# Patient Record
Sex: Female | Born: 1970
Health system: Southern US, Community
[De-identification: ages and names within clinical notes are randomized; demographics above are authoritative.]

## PROBLEM LIST (undated history)

## (undated) DIAGNOSIS — Z9849 Cataract extraction status, unspecified eye: Secondary | ICD-10-CM

## (undated) DIAGNOSIS — N644 Mastodynia: Secondary | ICD-10-CM

## (undated) DIAGNOSIS — B351 Tinea unguium: Secondary | ICD-10-CM

## (undated) DIAGNOSIS — E1140 Type 2 diabetes mellitus with diabetic neuropathy, unspecified: Secondary | ICD-10-CM

## (undated) DIAGNOSIS — Z789 Other specified health status: Secondary | ICD-10-CM

## (undated) DIAGNOSIS — D649 Anemia, unspecified: Secondary | ICD-10-CM

## (undated) DIAGNOSIS — E249 Cushing's syndrome, unspecified: Secondary | ICD-10-CM

## (undated) DIAGNOSIS — J449 Chronic obstructive pulmonary disease, unspecified: Secondary | ICD-10-CM

## (undated) DIAGNOSIS — R000 Tachycardia, unspecified: Secondary | ICD-10-CM

## (undated) DIAGNOSIS — IMO0002 Reserved for concepts with insufficient information to code with codable children: Secondary | ICD-10-CM

## (undated) DIAGNOSIS — E119 Type 2 diabetes mellitus without complications: Secondary | ICD-10-CM

## (undated) DIAGNOSIS — S22080A Wedge compression fracture of T11-T12 vertebra, initial encounter for closed fracture: Secondary | ICD-10-CM

## (undated) DIAGNOSIS — M8580 Other specified disorders of bone density and structure, unspecified site: Secondary | ICD-10-CM

## (undated) DIAGNOSIS — Z79899 Other long term (current) drug therapy: Secondary | ICD-10-CM

## (undated) DIAGNOSIS — Z22322 Carrier or suspected carrier of Methicillin resistant Staphylococcus aureus: Secondary | ICD-10-CM

## (undated) DIAGNOSIS — H539 Unspecified visual disturbance: Secondary | ICD-10-CM

## (undated) DIAGNOSIS — M79676 Pain in unspecified toe(s): Secondary | ICD-10-CM

## (undated) DIAGNOSIS — I10 Essential (primary) hypertension: Secondary | ICD-10-CM

## (undated) DIAGNOSIS — R1012 Left upper quadrant pain: Secondary | ICD-10-CM

## (undated) DIAGNOSIS — F329 Major depressive disorder, single episode, unspecified: Secondary | ICD-10-CM

## (undated) DIAGNOSIS — G4733 Obstructive sleep apnea (adult) (pediatric): Secondary | ICD-10-CM

## (undated) DIAGNOSIS — R200 Anesthesia of skin: Secondary | ICD-10-CM

## (undated) DIAGNOSIS — F32A Depression, unspecified: Secondary | ICD-10-CM

## (undated) DIAGNOSIS — R05 Cough: Secondary | ICD-10-CM

## (undated) DIAGNOSIS — J45909 Unspecified asthma, uncomplicated: Secondary | ICD-10-CM

## (undated) DIAGNOSIS — L84 Corns and callosities: Secondary | ICD-10-CM

## (undated) DIAGNOSIS — I5189 Other ill-defined heart diseases: Secondary | ICD-10-CM

## (undated) DIAGNOSIS — E559 Vitamin D deficiency, unspecified: Secondary | ICD-10-CM

## (undated) DIAGNOSIS — S32010A Wedge compression fracture of first lumbar vertebra, initial encounter for closed fracture: Secondary | ICD-10-CM

## (undated) DIAGNOSIS — R21 Rash and other nonspecific skin eruption: Secondary | ICD-10-CM

## (undated) DIAGNOSIS — F411 Generalized anxiety disorder: Secondary | ICD-10-CM

## (undated) DIAGNOSIS — E1165 Type 2 diabetes mellitus with hyperglycemia: Secondary | ICD-10-CM

## (undated) DIAGNOSIS — M898X9 Other specified disorders of bone, unspecified site: Secondary | ICD-10-CM

## (undated) HISTORY — DX: Type 2 diabetes mellitus with diabetic neuropathy, unspecified: E11.40

## (undated) HISTORY — DX: Other specified disorders of bone, unspecified site: M89.8X9

## (undated) HISTORY — DX: Depression, unspecified: F32.A

## (undated) HISTORY — DX: Other specified health status: Z78.9

## (undated) HISTORY — DX: Carrier or suspected carrier of methicillin resistant Staphylococcus aureus: Z22.322

## (undated) HISTORY — DX: Major depressive disorder, single episode, unspecified: F32.9

## (undated) HISTORY — DX: Anemia, unspecified: D64.9

## (undated) HISTORY — PX: VAGINAL HYSTERECTOMY: SUR661

## (undated) HISTORY — DX: Type 2 diabetes mellitus without complications: E11.9

## (undated) HISTORY — DX: Obstructive sleep apnea (adult) (pediatric): G47.33

## (undated) HISTORY — DX: Unspecified asthma, uncomplicated: J45.909

## (undated) HISTORY — DX: Cushing's syndrome, unspecified: E24.9

## (undated) HISTORY — DX: Left upper quadrant pain: R10.12

## (undated) HISTORY — DX: Type 2 diabetes mellitus with hyperglycemia: E11.65

## (undated) HISTORY — DX: Other specified disorders of bone density and structure, unspecified site: M85.80

## (undated) HISTORY — DX: Rash and other nonspecific skin eruption: R21

## (undated) HISTORY — DX: Generalized anxiety disorder: F41.1

## (undated) HISTORY — DX: Vitamin D deficiency, unspecified: E55.9

## (undated) HISTORY — DX: Chronic obstructive pulmonary disease, unspecified: J44.9

## (undated) HISTORY — DX: Mastodynia: N64.4

## (undated) HISTORY — DX: Other ill-defined heart diseases: I51.89

## (undated) HISTORY — DX: Unspecified visual disturbance: H53.9

## (undated) HISTORY — DX: Wedge compression fracture of first lumbar vertebra, initial encounter for closed fracture: S32.010A

## (undated) HISTORY — DX: Other long term (current) drug therapy: Z79.899

## (undated) HISTORY — DX: Cough: R05

## (undated) HISTORY — DX: Anesthesia of skin: R20.0

## (undated) HISTORY — DX: Tinea unguium: B35.1

## (undated) HISTORY — DX: Pain in unspecified toe(s): M79.676

## (undated) HISTORY — DX: Corns and callosities: L84

## (undated) HISTORY — DX: Tachycardia, unspecified: R00.0

## (undated) HISTORY — DX: Essential (primary) hypertension: I10

## (undated) HISTORY — PX: TUBAL LIGATION: SHX77

## (undated) HISTORY — DX: Morbid (severe) obesity due to excess calories: E66.01

## (undated) HISTORY — DX: Wedge compression fracture of t11-T12 vertebra, initial encounter for closed fracture: S22.080A

## (undated) HISTORY — DX: Cataract extraction status, unspecified eye: Z98.49

---

## 1991-09-13 HISTORY — PX: TUBAL LIGATION: SHX77

## 2003-09-13 DIAGNOSIS — I10 Essential (primary) hypertension: Secondary | ICD-10-CM

## 2003-09-13 HISTORY — DX: Essential (primary) hypertension: I10

## 2011-09-13 DIAGNOSIS — J449 Chronic obstructive pulmonary disease, unspecified: Secondary | ICD-10-CM

## 2011-09-13 DIAGNOSIS — E119 Type 2 diabetes mellitus without complications: Secondary | ICD-10-CM

## 2011-09-13 DIAGNOSIS — E249 Cushing's syndrome, unspecified: Secondary | ICD-10-CM

## 2011-09-13 HISTORY — DX: Type 2 diabetes mellitus without complications: E11.9

## 2011-09-13 HISTORY — DX: Cushing's syndrome, unspecified: E24.9

## 2011-09-13 HISTORY — DX: Chronic obstructive pulmonary disease, unspecified: J44.9

## 2012-09-12 HISTORY — PX: CATARACT EXTRACTION: SUR2

## 2013-11-19 DIAGNOSIS — M8580 Other specified disorders of bone density and structure, unspecified site: Secondary | ICD-10-CM

## 2013-11-19 DIAGNOSIS — T380X5A Adverse effect of glucocorticoids and synthetic analogues, initial encounter: Secondary | ICD-10-CM

## 2013-11-19 DIAGNOSIS — M818 Other osteoporosis without current pathological fracture: Secondary | ICD-10-CM | POA: Insufficient documentation

## 2013-11-19 HISTORY — DX: Other specified disorders of bone density and structure, unspecified site: M85.80

## 2014-08-26 ENCOUNTER — Ambulatory Visit: Payer: Medicaid Other | Attending: Family Medicine | Admitting: Family Medicine

## 2014-08-26 ENCOUNTER — Ambulatory Visit (HOSPITAL_COMMUNITY)
Admission: RE | Admit: 2014-08-26 | Discharge: 2014-08-26 | Disposition: A | Discharge status: Home / Self Care | Payer: Medicaid Other | Source: Ambulatory Visit | Attending: Family Medicine | Admitting: Family Medicine

## 2014-08-26 ENCOUNTER — Encounter: Payer: Self-pay | Admitting: Family Medicine

## 2014-08-26 VITALS — BP 154/90 | HR 87 | Temp 98.2°F | Resp 18 | Ht 59.0 in | Wt 181.0 lb

## 2014-08-26 DIAGNOSIS — E249 Cushing's syndrome, unspecified: Secondary | ICD-10-CM | POA: Insufficient documentation

## 2014-08-26 DIAGNOSIS — Z760 Encounter for issue of repeat prescription: Secondary | ICD-10-CM | POA: Diagnosis not present

## 2014-08-26 DIAGNOSIS — E1142 Type 2 diabetes mellitus with diabetic polyneuropathy: Secondary | ICD-10-CM

## 2014-08-26 DIAGNOSIS — M4854XA Collapsed vertebra, not elsewhere classified, thoracic region, initial encounter for fracture: Secondary | ICD-10-CM | POA: Diagnosis not present

## 2014-08-26 DIAGNOSIS — Z7952 Long term (current) use of systemic steroids: Secondary | ICD-10-CM | POA: Insufficient documentation

## 2014-08-26 DIAGNOSIS — Z0000 Encounter for general adult medical examination without abnormal findings: Secondary | ICD-10-CM | POA: Insufficient documentation

## 2014-08-26 DIAGNOSIS — IMO0002 Reserved for concepts with insufficient information to code with codable children: Secondary | ICD-10-CM

## 2014-08-26 DIAGNOSIS — I10 Essential (primary) hypertension: Secondary | ICD-10-CM | POA: Diagnosis not present

## 2014-08-26 DIAGNOSIS — Z79899 Other long term (current) drug therapy: Secondary | ICD-10-CM | POA: Insufficient documentation

## 2014-08-26 DIAGNOSIS — M4856XS Collapsed vertebra, not elsewhere classified, lumbar region, sequela of fracture: Secondary | ICD-10-CM | POA: Insufficient documentation

## 2014-08-26 DIAGNOSIS — M8580 Other specified disorders of bone density and structure, unspecified site: Secondary | ICD-10-CM | POA: Insufficient documentation

## 2014-08-26 DIAGNOSIS — M545 Low back pain: Secondary | ICD-10-CM | POA: Insufficient documentation

## 2014-08-26 DIAGNOSIS — E1165 Type 2 diabetes mellitus with hyperglycemia: Secondary | ICD-10-CM

## 2014-08-26 DIAGNOSIS — Z794 Long term (current) use of insulin: Secondary | ICD-10-CM | POA: Insufficient documentation

## 2014-08-26 DIAGNOSIS — J42 Unspecified chronic bronchitis: Secondary | ICD-10-CM | POA: Insufficient documentation

## 2014-08-26 DIAGNOSIS — R1030 Lower abdominal pain, unspecified: Secondary | ICD-10-CM | POA: Diagnosis not present

## 2014-08-26 DIAGNOSIS — S32010A Wedge compression fracture of first lumbar vertebra, initial encounter for closed fracture: Secondary | ICD-10-CM | POA: Insufficient documentation

## 2014-08-26 DIAGNOSIS — S32010S Wedge compression fracture of first lumbar vertebra, sequela: Secondary | ICD-10-CM

## 2014-08-26 DIAGNOSIS — J449 Chronic obstructive pulmonary disease, unspecified: Secondary | ICD-10-CM

## 2014-08-26 DIAGNOSIS — R809 Proteinuria, unspecified: Secondary | ICD-10-CM | POA: Insufficient documentation

## 2014-08-26 DIAGNOSIS — E559 Vitamin D deficiency, unspecified: Secondary | ICD-10-CM

## 2014-08-26 DIAGNOSIS — E1129 Type 2 diabetes mellitus with other diabetic kidney complication: Secondary | ICD-10-CM | POA: Insufficient documentation

## 2014-08-26 DIAGNOSIS — S22080A Wedge compression fracture of T11-T12 vertebra, initial encounter for closed fracture: Secondary | ICD-10-CM

## 2014-08-26 HISTORY — DX: Wedge compression fracture of first lumbar vertebra, initial encounter for closed fracture: S32.010A

## 2014-08-26 HISTORY — DX: Type 2 diabetes mellitus with hyperglycemia: E11.65

## 2014-08-26 HISTORY — DX: Reserved for concepts with insufficient information to code with codable children: IMO0002

## 2014-08-26 LAB — COMPLETE METABOLIC PANEL WITH GFR
ALT: 113 U/L — ABNORMAL HIGH (ref 0–35)
AST: 85 U/L — ABNORMAL HIGH (ref 0–37)
Albumin: 4 g/dL (ref 3.5–5.2)
Alkaline Phosphatase: 125 U/L — ABNORMAL HIGH (ref 39–117)
BUN: 14 mg/dL (ref 6–23)
CO2: 25 mEq/L (ref 19–32)
Calcium: 10.2 mg/dL (ref 8.4–10.5)
Chloride: 102 mEq/L (ref 96–112)
Creat: 0.76 mg/dL (ref 0.50–1.10)
GFR, Est African American: >89 mL/min
GFR, Est Non African American: >89 mL/min
Glucose, Bld: 157 mg/dL — ABNORMAL HIGH (ref 70–99)
Potassium: 3.8 mEq/L (ref 3.5–5.3)
Sodium: 141 mEq/L (ref 135–145)
Total Bilirubin: 0.3 mg/dL (ref 0.2–1.2)
Total Protein: 6.9 g/dL (ref 6.0–8.3)

## 2014-08-26 LAB — CBC
HCT: 38.8 % (ref 36.0–46.0)
Hemoglobin: 13 g/dL (ref 12.0–15.0)
MCH: 26.2 pg (ref 26.0–34.0)
MCHC: 33.5 g/dL (ref 30.0–36.0)
MCV: 78.1 fL (ref 78.0–100.0)
MPV: 9.4 fL (ref 9.4–12.4)
Platelets: 444 10*3/uL — ABNORMAL HIGH (ref 150–400)
RBC: 4.97 MIL/uL (ref 3.87–5.11)
RDW: 16.5 % — ABNORMAL HIGH (ref 11.5–15.5)
WBC: 14.7 10*3/uL — ABNORMAL HIGH (ref 4.0–10.5)

## 2014-08-26 LAB — GLUCOSE, POCT (MANUAL RESULT ENTRY): POC Glucose: 154 mg/dl — AB (ref 70–99)

## 2014-08-26 LAB — POCT GLYCOSYLATED HEMOGLOBIN (HGB A1C): Hemoglobin A1C: 8.2

## 2014-08-26 MED ORDER — ASPIRIN 325 MG PO TABS: 325.0000 mg | ORAL_TABLET | Freq: Every day | ORAL | Status: DC

## 2014-08-26 MED ORDER — CLONAZEPAM 0.5 MG PO TABS: 0.5000 mg | ORAL_TABLET | Freq: Two times a day (BID) | ORAL | Status: DC | PRN

## 2014-08-26 MED ORDER — INSULIN LISPRO 100 UNIT/ML ~~LOC~~ SOLN: 25.0000 [IU] | Freq: Three times a day (TID) | SUBCUTANEOUS | Status: DC

## 2014-08-26 MED ORDER — RANITIDINE HCL 300 MG PO TABS: 300.0000 mg | ORAL_TABLET | Freq: Every day | ORAL | Status: DC

## 2014-08-26 MED ORDER — CLONIDINE HCL 0.2 MG PO TABS: 0.2000 mg | ORAL_TABLET | Freq: Two times a day (BID) | ORAL | Status: DC

## 2014-08-26 MED ORDER — FERROUS SULFATE 325 (65 FE) MG PO TABS: 325.0000 mg | ORAL_TABLET | Freq: Every day | ORAL | Status: DC

## 2014-08-26 MED ORDER — LIDOCAINE 5 % EX PTCH: 1.0000 | MEDICATED_PATCH | CUTANEOUS | Status: DC

## 2014-08-26 MED ORDER — VITAMIN D3 25 MCG (1000 UNIT) PO TABS: 1000.0000 [IU] | ORAL_TABLET | Freq: Every day | ORAL | Status: DC

## 2014-08-26 MED ORDER — LOSARTAN POTASSIUM 100 MG PO TABS: 100.0000 mg | ORAL_TABLET | Freq: Every day | ORAL | Status: DC

## 2014-08-26 MED ORDER — PAROXETINE HCL 10 MG PO TABS: 10.0000 mg | ORAL_TABLET | Freq: Every day | ORAL | Status: DC

## 2014-08-26 MED ORDER — OMEPRAZOLE 20 MG PO CPDR: 20.0000 mg | DELAYED_RELEASE_CAPSULE | Freq: Every day | ORAL | Status: DC

## 2014-08-26 MED ORDER — TRAMADOL HCL 50 MG PO TABS
50.0000 mg | ORAL_TABLET | Freq: Three times a day (TID) | ORAL | Status: DC | PRN
Start: 2014-08-26 — End: 2014-09-16

## 2014-08-26 MED ORDER — FOLIC ACID 1 MG PO TABS: 1.0000 mg | ORAL_TABLET | Freq: Every day | ORAL | Status: DC

## 2014-08-26 MED ORDER — "INSULIN SYRINGE-NEEDLE U-100 31G X 15/64"" 1 ML MISC": 1.0000 | Freq: Four times a day (QID) | Status: DC

## 2014-08-26 MED ORDER — FLUTICASONE-SALMETEROL 100-50 MCG/DOSE IN AEPB: 1.0000 | INHALATION_SPRAY | Freq: Two times a day (BID) | RESPIRATORY_TRACT | Status: DC

## 2014-08-26 MED ORDER — BUDESONIDE 0.5 MG/2ML IN SUSP: 0.5000 mg | Freq: Two times a day (BID) | RESPIRATORY_TRACT | Status: DC

## 2014-08-26 MED ORDER — CLONIDINE HCL 0.1 MG PO TABS
0.1000 mg | ORAL_TABLET | Freq: Once | ORAL | Status: AC
Start: 2014-08-26 — End: 2014-08-26
  Administered 2014-08-26: 0.1 mg via ORAL

## 2014-08-26 MED ORDER — TERBUTALINE SULFATE 2.5 MG PO TABS: 2.5000 mg | ORAL_TABLET | Freq: Two times a day (BID) | ORAL | Status: DC

## 2014-08-26 MED ORDER — METFORMIN HCL 500 MG PO TABS: 500.0000 mg | ORAL_TABLET | Freq: Two times a day (BID) | ORAL | Status: DC

## 2014-08-26 MED ORDER — FLUTICASONE-SALMETEROL 500-50 MCG/DOSE IN AEPB: 1.0000 | INHALATION_SPRAY | Freq: Two times a day (BID) | RESPIRATORY_TRACT | Status: DC

## 2014-08-26 MED ORDER — VERAPAMIL HCL 240 MG (CO) PO TB24: 240.0000 mg | ORAL_TABLET | Freq: Every day | ORAL | Status: DC

## 2014-08-26 MED ORDER — OYSTER SHELL CALCIUM 500 MG PO TABS: 500.0000 mg | ORAL_TABLET | Freq: Two times a day (BID) | ORAL | Status: DC

## 2014-08-26 MED ORDER — GABAPENTIN 800 MG PO TABS: 800.0000 mg | ORAL_TABLET | Freq: Two times a day (BID) | ORAL | Status: DC

## 2014-08-26 MED ORDER — FUROSEMIDE 20 MG PO TABS: 20.0000 mg | ORAL_TABLET | Freq: Every day | ORAL | Status: DC

## 2014-08-26 MED ORDER — MONTELUKAST SODIUM 10 MG PO TABS: 10.0000 mg | ORAL_TABLET | Freq: Every day | ORAL | Status: DC

## 2014-08-26 MED ORDER — THEOPHYLLINE ER 300 MG PO TB12: 300.0000 mg | ORAL_TABLET | Freq: Two times a day (BID) | ORAL | Status: DC

## 2014-08-26 MED ORDER — PREDNISONE 20 MG PO TABS: 40.0000 mg | ORAL_TABLET | Freq: Every day | ORAL | Status: DC

## 2014-08-26 MED ORDER — ALBUTEROL SULFATE (2.5 MG/3ML) 0.083% IN NEBU
2.5000 mg | INHALATION_SOLUTION | Freq: Four times a day (QID) | RESPIRATORY_TRACT | Status: DC | PRN
Start: 2014-08-26 — End: 2014-09-29

## 2014-08-26 MED ORDER — INSULIN GLARGINE 100 UNIT/ML ~~LOC~~ SOLN: 70.0000 [IU] | Freq: Every day | SUBCUTANEOUS | Status: DC

## 2014-08-26 NOTE — Patient Instructions (Addendum)
Mrs. Raven Turner,  Thank you for coming in today. It was a pleasure meeting you. I look forward to being your primary doctor.   1. Compression fracture:  Low back x-ray Vitamin D, calcium Tramadol and Lidoderm patch   2. Cushing syndrome and diabetes:  refilled insulin Refilled prednisone  endocrinology referral  3. HTN:  Refilled all medications  4. COPD: refilled inhalers  Referral to pulmonology   You will be called with lab results.   F/u in 2 weeks for RN BP check.  F/u with me in 4 weeks   Dr. Adrian Blackwater

## 2014-08-26 NOTE — Progress Notes (Signed)
Pt comes in to establish care for COPD,HTN,Asthma,Cushing Syndrome,Diabetes Neuropathy,Depression and Compression fracture of spine Pt recently moved to [place] from [place] June 2015 Multiple medical problems noted with medications Taking Humulin 25 units TID with Lantus 70 units @ bedtime Declined flu/pna vaccine BP 165/99 128 Denies chest pain or sob Spanish interpretor present

## 2014-08-26 NOTE — Progress Notes (Signed)
   Subjective:    Patient ID: Raven Turner, female    DOB: [birth year], 43 y.o.   MRN: [id number] CC: establish care, DM2, HTN, COPD, spanish speaker from [place]  HPI  43 yo F from [place] presents to establish care, refill all medications, Discuss the following:   1. L groin pain: x one week. Radiates to L gluteal and down L leg. No similar pain previously. No fecal or urinary incontinence since onset of pain. Has some urinary leakage after urination. Patient does have a history of L1 compression fracture and osteopenia related to chronic steroid use for Cushing syndrome.   2. Cushing syndrome: dx in 2013. Taking prednisone 40 mg daily. In the past had symptoms of adrenal insufficiency whenever the dose was weaned down. Not taking calcium or vitamin D.   3. HTN: ran out of verapamil and losartan yesterday. Took clonidine 0.2 mg last night. Having slight headahce. No CP or SOB at rest. Some SOB with exertion.  4. COPD: using inhalers and taking oral theophylline and terbinafine. Admits to worsening DOE. No cough, CP or dyspnea at rest.   Soc Hx: non smoker  Med Hx: Cushing syndrome, DM2, COPD, HTN, anxiety  Surg Hx:  Review of Systems As per HPI     Objective:   Physical Exam BP 165/99 mmHg  Pulse 128  Temp(Src) 98.2 F (36.8 C) (Oral)  Resp 18  Ht 4\' 11"  (1.499 m)  Wt 181 lb (82.101 kg)  BMI 36.54 kg/m2  SpO2 98% General appearance: alert, cooperative and no distress Lungs: clear to auscultation bilaterally Heart: regular rate and rhythm, S1, S2 normal, no murmur, click, rub or gallop Extremities: edema trace LE  Skin: few pustules on L leg  Lab Results  Component Value Date   HGBA1C 8.2 08/26/2014  CBG 154    Treated with clonidine 0.1 mg x one in office.  F/u BP and HR: 154/90, HR 87     Assessment & Plan:

## 2014-08-27 LAB — VITAMIN D 25 HYDROXY (VIT D DEFICIENCY, FRACTURES): Vit D, 25-Hydroxy: 13 ng/mL — ABNORMAL LOW (ref 30–100)

## 2014-08-27 NOTE — Assessment & Plan Note (Signed)
A: Compression fracture: with low back pain and radiculopathy. On gabapentin with persistent pain. P: Low back x-ray Vitamin D, calcium Tramadol and Lidoderm patch

## 2014-08-27 NOTE — Assessment & Plan Note (Signed)
3. HTN: elevated BP Med: out of medication. Treated BP and elevated HR in office with clonidine 0.1 mg PO x one  P: Refilled all medications

## 2014-08-27 NOTE — Assessment & Plan Note (Signed)
A: COPD with DOE, no hypoxemia at rest  P: refilled inhalers and oral mediactions Referral to pulmonology

## 2014-08-27 NOTE — Assessment & Plan Note (Addendum)
A: DM2 in the setting of chronic daily steroid use. Fair control of CBGs.  Lab Results  Component Value Date   HGBA1C 8.2 08/26/2014  P:  refilled insulin Metformin Needles and syringes Endocrinology referral

## 2014-08-27 NOTE — Assessment & Plan Note (Signed)
Cushing syndrome and diabetes:  refilled insulin Refilled prednisone  endocrinology referral

## 2014-08-28 DIAGNOSIS — E559 Vitamin D deficiency, unspecified: Secondary | ICD-10-CM

## 2014-08-28 DIAGNOSIS — S22080A Wedge compression fracture of T11-T12 vertebra, initial encounter for closed fracture: Secondary | ICD-10-CM | POA: Insufficient documentation

## 2014-08-28 HISTORY — DX: Wedge compression fracture of T11-T12 vertebra, initial encounter for closed fracture: S22.080A

## 2014-08-28 HISTORY — DX: Vitamin D deficiency, unspecified: E55.9

## 2014-08-28 MED ORDER — VITAMIN D (ERGOCALCIFEROL) 1.25 MG (50000 UNIT) PO CAPS: 50000.0000 [IU] | ORAL_CAPSULE | ORAL | Status: DC

## 2014-08-28 NOTE — Addendum Note (Signed)
Addended by: Boykin Nearing on: 08/28/2014 10:54 AM   Modules accepted: Orders

## 2014-08-28 NOTE — Assessment & Plan Note (Addendum)
A: vit D deficiency P: Oral vitamin D 50,000 IU weekly x 8 weeks

## 2014-09-03 ENCOUNTER — Telehealth: Payer: Self-pay | Admitting: *Deleted

## 2014-09-03 NOTE — Telephone Encounter (Signed)
-----   Message from Minerva Ends, MD sent at 08/28/2014 10:47 AM EST ----- Slight elevated glucose and liver transaminases, no f/u needed at this time.  Slight elevated WBC to be expected in chronic steroid use. Vit D deficiency, supplement x 8 weeks ordered

## 2014-09-03 NOTE — Telephone Encounter (Signed)
Left voice message to return call 

## 2014-09-03 NOTE — Telephone Encounter (Signed)
-----   Message from Minerva Ends, MD sent at 08/28/2014 10:50 AM EST ----- Another compression fracture at T12, right above L1

## 2014-09-03 NOTE — Telephone Encounter (Signed)
Pt returned call, aware of lab and X-Ray results

## 2014-09-04 ENCOUNTER — Ambulatory Visit (INDEPENDENT_AMBULATORY_CARE_PROVIDER_SITE_OTHER): Payer: Medicaid Other | Admitting: Internal Medicine

## 2014-09-04 ENCOUNTER — Encounter: Payer: Self-pay | Admitting: Internal Medicine

## 2014-09-04 VITALS — BP 136/78 | HR 122 | Temp 98.6°F | Ht 59.0 in | Wt 180.0 lb

## 2014-09-04 DIAGNOSIS — R05 Cough: Secondary | ICD-10-CM

## 2014-09-04 DIAGNOSIS — R058 Other specified cough: Secondary | ICD-10-CM

## 2014-09-04 DIAGNOSIS — J45998 Other asthma: Secondary | ICD-10-CM

## 2014-09-04 MED ORDER — MOMETASONE FURO-FORMOTEROL FUM 100-5 MCG/ACT IN AERO: INHALATION_SPRAY | RESPIRATORY_TRACT | Status: DC

## 2014-09-04 NOTE — Patient Instructions (Addendum)
Stop the advair, the brethine  and the theophylline  Start dulera 100 Take 2 puffs first thing in am and then another 2 puffs about 12 hours later.   continue prilosec 20 mg Take 30- 60 min before your first and last meals of the day   GERD (REFLUX)  is an extremely common cause of respiratory symptoms just like yours , many times with no obvious heartburn at all.    It can be treated with medication, but also with lifestyle changes including avoidance of late meals, excessive alcohol, smoking cessation, and avoid fatty foods, chocolate, peppermint, colas, red wine, and acidic juices such as orange juice.  NO MINT OR MENTHOL PRODUCTS SO NO COUGH DROPS   USE SUGARLESS CANDY INSTEAD (Jolley ranchers or Stover's or Life Savers) or even ice chips will also do - the key is to swallow to prevent all throat clearing. NO OIL BASED VITAMINS - use powdered substitutes.  Only use your albuterol nebulizer  as a rescue medication to be used if you can't catch your breath by resting or doing a relaxed purse lip breathing pattern.  - The less you use it, the better it will work when you need it. - Ok to use up to  every 4 hours if you must but call for immediate appointment if use goes up over your usual need  If you find you don't need the nebulizer as much, then we can start reducing your prednisone by 10 mg a week  See Tammy NP w/in 2 weeks with all your medications, even over the counter meds, separated in two separate bags, the ones you take no matter what vs the ones you stop once you feel better and take only as needed when you feel you need them.   Tammy  will generate for you a new user friendly medication calendar that will put [place] all on the same page re: your medication use.     Without this process, it simply isn't possible to assure that we are providing  your outpatient care  with  the attention to detail we feel you deserve.   If we cannot assure that you're getting that kind of care,  then we  cannot manage your problem effectively from this clinic.  Once you have seen Tammy and we are sure that we're all on the same page with your medication use she will arrange follow up with me.

## 2014-09-04 NOTE — Progress Notes (Signed)
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  Onset was childhood but continued to have "good days and bad days" until 2010 and no good days since then even p pred started and no better since arrived in [place] summer 2015     09/04/2014 1st [facility] office visit/ Raven Turner   Chief Complaint  Patient presents with  . Advice Only    Referred for chronic bronchitis. Recently moved from [place].   Every day's about the same since 2012 admitted to [facility] every month but not since arrival in [place]/   maintaining on prednisone ? Dose plus theoph/ brethine/ advair 500 and nebs "all day long"  But interestingly less symptoms nocturnally. Cough is mostly dry assoc with hoarseness   No obvious day to day or daytime variabilty or assoc  cp or chest tightness, subjective wheeze overt sinus or hb symptoms. No unusual exp hx or h/o childhood pna  or knowledge of premature birth.  Sleeping ok without nocturnal  or early am exacerbation  of respiratory  c/o's or need for noct saba. Also denies any obvious fluctuation of symptoms with weather or environmental changes or other aggravating or alleviating factors except as outlined above   Current Medications, Allergies, Complete Past Medical History, Past Surgical History, Family History, and Social History were reviewed in [employer] record.  ROS  The following are not active complaints unless bolded sore throat, dysphagia, dental problems, itching, sneezing,  nasal congestion or excess/ purulent secretions, ear ache,   fever, chills, sweats, unintended wt loss, pleuritic or exertional cp, hemoptysis,  orthopnea pnd or leg swelling, presyncope, palpitations, heartburn, abdominal  pain, anorexia, nausea, vomiting, diarrhea  or change in bowel or urinary habits, change in stools or urine, dysuria,hematuria,  rash, arthralgias, visual complaints, headache, numbness weakness or ataxia or problems with walking or coordination,  change in mood/affect or memory.         Review of Systems  Constitutional: Negative for fever and unexpected weight change.  HENT: Positive for congestion. Negative for dental problem, ear pain, nosebleeds, postnasal drip, rhinorrhea, sinus pressure, sneezing, sore throat and trouble swallowing.   Eyes: Negative for redness and itching.  Respiratory: Positive for cough and shortness of breath. Negative for chest tightness and wheezing.   Cardiovascular: Positive for leg swelling. Negative for palpitations.  Gastrointestinal: Negative for nausea and vomiting.  Genitourinary: Negative for dysuria.  Musculoskeletal: Negative for joint swelling.  Skin: Negative for rash.  Neurological: Negative for headaches.  Hematological: Does not bruise/bleed easily.  Psychiatric/Behavioral: Negative for dysphoric mood. The patient is not nervous/anxious.        Objective:   Physical Exam   Hoarse cushingnoid amb hispanic female nad but very prominent pseudowheeze    Wt Readings from Last 3 Encounters:  09/04/14 180 lb (81.647 kg)  08/26/14 181 lb (82.101 kg)    Vital signs reviewed  HEENT: nl dentition, turbinates, and orophanx. Nl external ear canals without cough reflex   NECK :  without JVD/Nodes/TM/ nl carotid upstrokes bilaterally   LUNGS: no acc muscle use, clear to A and P bilaterally without cough on insp or exp maneuvers - no wheeze with plm  CV:  RRR  no s3 or murmur or increase in P2, no edema   ABD:  soft and nontender with nl excursion in the supine position. No bruits or organomegaly, bowel sounds nl  MS:  warm without deformities, calf tenderness, cyanosis or clubbing  SKIN: warm and dry without lesions    NEURO:  alert,  approp, no deficits         Assessment & Plan:

## 2014-09-05 ENCOUNTER — Encounter: Payer: Self-pay | Admitting: Internal Medicine

## 2014-09-05 DIAGNOSIS — J45998 Other asthma: Secondary | ICD-10-CM | POA: Insufficient documentation

## 2014-09-05 DIAGNOSIS — R058 Other specified cough: Secondary | ICD-10-CM

## 2014-09-05 DIAGNOSIS — R05 Cough: Secondary | ICD-10-CM | POA: Insufficient documentation

## 2014-09-05 HISTORY — DX: Other specified cough: R05.8

## 2014-09-05 HISTORY — DX: Cough: R05

## 2014-09-05 NOTE — Assessment & Plan Note (Signed)
Her refractory symptoms at present are predominantly upper airway and are being made worse by multiple of her asthma meds and she is using so mcch saba she is running a high risk of saba tachyphylaxis/ asthma death  Rec  Try off theoph, brethine and just use the saba neb prn up to every 4 h   Taught her per lip breathing to control "wheezing"  The proper method of use, as well as anticipated side effects, of a metered-dose inhaler are discussed and demonstrated to the patient. Improved effectiveness after extensive coaching during this visit to a level of approximately  75% so try dulera 100 2bid (less likely to bother the upper airway)  Return in 2 weeks for med reconciliation and go from there.

## 2014-09-05 NOTE — Assessment & Plan Note (Addendum)
DDX of  difficult airways management all start with A and  include Adherence, Ace Inhibitors, Acid Reflux, Active Sinus Disease, Alpha 1 Antitripsin deficiency, Anxiety masquerading as Airways dz,  ABPA,  allergy(esp in young), Aspiration (esp in elderly), Adverse effects of DPI,  Active smokers, plus two Bs  = Bronchiectasis and Beta blocker use..and one C= CHF   Adherence is always the initial "prime suspect" and is a multilayered concern that requires a "trust but verify" approach in every patient - starting with knowing how to use medications, especially inhalers, correctly, keeping up with refills and understanding the fundamental difference between maintenance and prns vs those medications only taken for a very short course and then stopped and not refilled.  - really does not know meds well.  To keep things simple, I have asked the patient to first separate medicines that are perceived as maintenance, that is to be taken daily "no matter what", from those medicines that are taken on only on an as-needed basis and I have given the patient examples of both, and then return to see our NP to generate a  detailed  medication calendar which should be followed until the next physician sees the patient and updates it.    ? Acid (or non-acid) GERD > always difficult to exclude as up to 75% of pts in some series report no assoc GI/ Heartburn symptoms> rec max (24h)  acid suppression and diet restrictions/trial off theoph.   ? Adverse effect of dpi > try off advair and substitute with duelra 100 2bid   ? Allergy > even if allergic should be able to taper prednisone to 10 mg weekly until back to physiologic levels (can't d/c as likely adrenal insuff now)  See instructions for specific recommendations which were reviewed directly with the patient who was given a copy with highlighter outlining the key components and given to her in [place] by Designer, television/film set and verbally through interpreter

## 2014-09-07 ENCOUNTER — Emergency Department (HOSPITAL_COMMUNITY): Payer: Medicaid Other

## 2014-09-07 ENCOUNTER — Encounter (HOSPITAL_COMMUNITY): Payer: Self-pay | Admitting: Nurse Practitioner

## 2014-09-07 ENCOUNTER — Inpatient Hospital Stay (HOSPITAL_COMMUNITY)
Admission: EM | Admit: 2014-09-07 | Discharge: 2014-09-09 | DRG: 193 | Disposition: A | Discharge status: Home / Self Care | Payer: Medicaid Other | Attending: Internal Medicine | Admitting: Internal Medicine

## 2014-09-07 DIAGNOSIS — Z7952 Long term (current) use of systemic steroids: Secondary | ICD-10-CM | POA: Diagnosis not present

## 2014-09-07 DIAGNOSIS — J4551 Severe persistent asthma with (acute) exacerbation: Secondary | ICD-10-CM | POA: Diagnosis present

## 2014-09-07 DIAGNOSIS — D649 Anemia, unspecified: Secondary | ICD-10-CM | POA: Diagnosis present

## 2014-09-07 DIAGNOSIS — Z8639 Personal history of other endocrine, nutritional and metabolic disease: Secondary | ICD-10-CM

## 2014-09-07 DIAGNOSIS — J45901 Unspecified asthma with (acute) exacerbation: Secondary | ICD-10-CM

## 2014-09-07 DIAGNOSIS — E1165 Type 2 diabetes mellitus with hyperglycemia: Secondary | ICD-10-CM | POA: Diagnosis present

## 2014-09-07 DIAGNOSIS — M25529 Pain in unspecified elbow: Secondary | ICD-10-CM

## 2014-09-07 DIAGNOSIS — I10 Essential (primary) hypertension: Secondary | ICD-10-CM | POA: Diagnosis present

## 2014-09-07 DIAGNOSIS — J9600 Acute respiratory failure, unspecified whether with hypoxia or hypercapnia: Secondary | ICD-10-CM | POA: Diagnosis present

## 2014-09-07 DIAGNOSIS — J189 Pneumonia, unspecified organism: Principal | ICD-10-CM | POA: Diagnosis present

## 2014-09-07 DIAGNOSIS — M8580 Other specified disorders of bone density and structure, unspecified site: Secondary | ICD-10-CM | POA: Diagnosis present

## 2014-09-07 DIAGNOSIS — IMO0002 Reserved for concepts with insufficient information to code with codable children: Secondary | ICD-10-CM | POA: Diagnosis present

## 2014-09-07 DIAGNOSIS — Z91013 Allergy to seafood: Secondary | ICD-10-CM

## 2014-09-07 DIAGNOSIS — R05 Cough: Secondary | ICD-10-CM | POA: Diagnosis present

## 2014-09-07 DIAGNOSIS — F329 Major depressive disorder, single episode, unspecified: Secondary | ICD-10-CM | POA: Diagnosis present

## 2014-09-07 DIAGNOSIS — Z789 Other specified health status: Secondary | ICD-10-CM | POA: Diagnosis present

## 2014-09-07 DIAGNOSIS — J441 Chronic obstructive pulmonary disease with (acute) exacerbation: Secondary | ICD-10-CM | POA: Diagnosis present

## 2014-09-07 DIAGNOSIS — Z6836 Body mass index (BMI) 36.0-36.9, adult: Secondary | ICD-10-CM

## 2014-09-07 HISTORY — DX: Reserved for concepts with insufficient information to code with codable children: IMO0002

## 2014-09-07 LAB — CBC
HCT: 40.7 % (ref 36.0–46.0)
Hemoglobin: 12.8 g/dL (ref 12.0–15.0)
MCH: 26.1 pg (ref 26.0–34.0)
MCHC: 31.4 g/dL (ref 30.0–36.0)
MCV: 83.1 fL (ref 78.0–100.0)
Platelets: 420 10*3/uL — ABNORMAL HIGH (ref 150–400)
RBC: 4.9 MIL/uL (ref 3.87–5.11)
RDW: 16.4 % — ABNORMAL HIGH (ref 11.5–15.5)
WBC: 14.7 10*3/uL — ABNORMAL HIGH (ref 4.0–10.5)

## 2014-09-07 LAB — BASIC METABOLIC PANEL
Anion gap: 13 (ref 5–15)
BUN: 13 mg/dL (ref 6–23)
CO2: 25 mmol/L (ref 19–32)
Calcium: 10.1 mg/dL (ref 8.4–10.5)
Chloride: 102 mEq/L (ref 96–112)
Creatinine, Ser: 1.03 mg/dL (ref 0.50–1.10)
GFR calc Af Amer: 77 mL/min — ABNORMAL LOW (ref >90)
GFR calc non Af Amer: 66 mL/min — ABNORMAL LOW (ref >90)
Glucose, Bld: 321 mg/dL — ABNORMAL HIGH (ref 70–99)
Potassium: 4.1 mmol/L (ref 3.5–5.1)
Sodium: 140 mmol/L (ref 135–145)

## 2014-09-07 LAB — I-STAT TROPONIN, ED: Troponin i, poc: 0.01 ng/mL (ref 0.00–0.08)

## 2014-09-07 LAB — CBG MONITORING, ED: Glucose-Capillary: 172 mg/dL — ABNORMAL HIGH (ref 70–99)

## 2014-09-07 MED ORDER — SODIUM CHLORIDE 0.9 % IV BOLUS (SEPSIS)
1000.0000 mL | Freq: Once | INTRAVENOUS | Status: AC
  Administered 2014-09-07: 1000 mL via INTRAVENOUS

## 2014-09-07 MED ORDER — CLONIDINE HCL 0.1 MG PO TABS
0.2000 mg | ORAL_TABLET | Freq: Every day | ORAL | Status: DC
  Administered 2014-09-07 – 2014-09-08 (×2): 0.2 mg via ORAL
  Filled 2014-09-07: qty 1
  Filled 2014-09-07: qty 2

## 2014-09-07 MED ORDER — LEVOFLOXACIN IN D5W 750 MG/150ML IV SOLN: 750.0000 mg | Freq: Once | INTRAVENOUS | Status: DC

## 2014-09-07 MED ORDER — IPRATROPIUM-ALBUTEROL 0.5-2.5 (3) MG/3ML IN SOLN
3.0000 mL | Freq: Once | RESPIRATORY_TRACT | Status: AC
  Administered 2014-09-07: 3 mL via RESPIRATORY_TRACT
  Filled 2014-09-07: qty 3

## 2014-09-07 MED ORDER — INSULIN GLARGINE 100 UNIT/ML ~~LOC~~ SOLN
70.0000 [IU] | Freq: Every day | SUBCUTANEOUS | Status: DC
  Administered 2014-09-08 (×2): 70 [IU] via SUBCUTANEOUS
  Filled 2014-09-07 (×3): qty 0.7

## 2014-09-07 NOTE — ED Notes (Addendum)
She c/o productive cough with yellow sputum and body aches since yesterday. No fevers. A&ox4, resp e/u. Also shes had redness, pain and heat in her R elbow x 3 days, denies any injuries.

## 2014-09-07 NOTE — ED Provider Notes (Signed)
CSN: [id number]     Arrival date & time 09/07/14  1643 History   First MD Initiated Contact with Patient 09/07/14 1857     Chief Complaint  Patient presents with  . Cough     (Consider location/radiation/quality/duration/timing/severity/associated sxs/prior Treatment) HPI  Deirdre Evener is a 43 y.o. female with PMH of asthma, COPD, hypertension, diabetes, anemia presenting with one-day history of productive cough of thick yellow sputum as well as body aches. Patient denies any fevers, chills, nausea, vomiting. She endorses rhinorrhea and sore throat. No headaches. Patient endorses some chest tightness with deep breathing and after coughing. She denies cardiac history or chest tightness with exertion. She denies shortness of breath, difficulty breathing. Patient has home inhalers which he is taking with mild improvement. Patient denies swelling in her legs. No back pain. Patient also endorses some redness, swelling pain and heat in her right upper 3 days. She denies any injuries. Pain is worse with movement. She denies numbness, tingling, weakness.   Past Medical History  Diagnosis Date  . Asthma   . Cushing syndrome 2013   . Depression   . Anemia   . Osteopenia 11/19/2013    Dx with osteopenia in lumbar vertebra with partial compression of L1  . Diabetes 2013   . Diabetes mellitus with neuropathy   . HTN (hypertension) 2005   . COPD (chronic obstructive pulmonary disease) 2013  . Compression fracture    Past Surgical History  Procedure Laterality Date  . Cataract extraction Bilateral 2014  . Vaginal hysterectomy     Family History  Problem Relation Age of Onset  . Diabetes Mother   . Stroke Father   . Asthma Father    History  Substance Use Topics  . Smoking status: Never Smoker   . Smokeless tobacco: Never Used  . Alcohol Use: No   OB History    No data available     Review of Systems  10 Systems reviewed and are negative for acute change except as noted in  the HPI.    Allergies  Shellfish allergy  Home Medications   Prior to Admission medications   Medication Sig Start Date End Date Taking? Authorizing Provider  albuterol (PROVENTIL) (2.5 MG/3ML) 0.083% nebulizer solution Take 3 mLs (2.5 mg total) by nebulization every 6 (six) hours as needed for wheezing or shortness of breath. Patient taking differently: Take 2.5 mg by nebulization at bedtime.  08/26/14  Yes Minerva Ends, MD  aspirin 325 MG tablet Take 1 tablet (325 mg total) by mouth daily. 08/26/14  Yes Josalyn C Funches, MD  budesonide (PULMICORT) 0.25 MG/2ML nebulizer solution Take 0.25 mg by nebulization 2 (two) times daily.   Yes Historical Provider, MD  clonazePAM (KLONOPIN) 0.5 MG tablet Take 1 tablet (0.5 mg total) by mouth 2 (two) times daily as needed for anxiety. Patient taking differently: Take 0.5 mg by mouth at bedtime.  08/26/14  Yes Josalyn C Funches, MD  cloNIDine (CATAPRES) 0.2 MG tablet Take 1 tablet (0.2 mg total) by mouth 2 (two) times daily. Patient taking differently: Take 0.2 mg by mouth at bedtime.  08/26/14  Yes Josalyn C Funches, MD  ferrous sulfate 325 (65 FE) MG tablet Take 1 tablet (325 mg total) by mouth daily with breakfast. 08/26/14  Yes Josalyn C Funches, MD  folic acid (FOLVITE) 1 MG tablet Take 1 tablet (1 mg total) by mouth daily. 08/26/14  Yes Josalyn C Funches, MD  furosemide (LASIX) 20 MG tablet Take 1 tablet (  20 mg total) by mouth daily. 08/26/14  Yes Josalyn C Funches, MD  gabapentin (NEURONTIN) 800 MG tablet Take 1 tablet (800 mg total) by mouth 2 (two) times daily. 08/26/14  Yes Josalyn C Funches, MD  insulin glargine (LANTUS) 100 UNIT/ML injection Inject 0.7 mLs (70 Units total) into the skin at bedtime. 08/26/14  Yes Josalyn C Funches, MD  insulin lispro (HUMALOG) 100 UNIT/ML injection Inject 0.25 mLs (25 Units total) into the skin 3 (three) times daily with meals. 08/26/14  Yes Josalyn C Funches, MD  losartan (COZAAR) 100 MG tablet Take 1  tablet (100 mg total) by mouth daily. 08/26/14  Yes Josalyn C Funches, MD  metFORMIN (GLUCOPHAGE) 500 MG tablet Take 1 tablet (500 mg total) by mouth 2 (two) times daily with a meal. Patient taking differently: Take 500 mg by mouth daily with breakfast.  08/26/14  Yes Josalyn C Funches, MD  mometasone-formoterol (DULERA) 100-5 MCG/ACT AERO Take 2 puffs first thing in am and then another 2 puffs about 12 hours later. 09/04/14  Yes Tanda Rockers, MD  montelukast (SINGULAIR) 10 MG tablet Take 1 tablet (10 mg total) by mouth at bedtime. 08/26/14  Yes Josalyn C Funches, MD  omeprazole (PRILOSEC) 20 MG capsule Take 1 capsule (20 mg total) by mouth daily. 08/26/14  Yes Minerva Ends, MD  [place] Calcium 500 MG TABS Take 0.4 tablets (500 mg total) by mouth 2 (two) times daily after a meal. 08/26/14  Yes Josalyn C Funches, MD  PARoxetine (PAXIL) 10 MG tablet Take 1 tablet (10 mg total) by mouth daily. 08/26/14  Yes Josalyn C Funches, MD  predniSONE (DELTASONE) 20 MG tablet Take 2 tablets (40 mg total) by mouth daily with breakfast. 08/26/14  Yes Josalyn C Funches, MD  ranitidine (ZANTAC) 300 MG tablet Take 1 tablet (300 mg total) by mouth at bedtime. 08/26/14  Yes Josalyn C Funches, MD  theophylline (THEODUR) 300 MG 12 hr tablet Take 300 mg by mouth 2 (two) times daily.   Yes Historical Provider, MD  traMADol (ULTRAM) 50 MG tablet Take 1 tablet (50 mg total) by mouth every 8 (eight) hours as needed. Patient taking differently: Take 50 mg by mouth at bedtime.  08/26/14  Yes Josalyn C Funches, MD  verapamil (COVERA HS) 240 MG (CO) 24 hr tablet Take 1 tablet (240 mg total) by mouth at bedtime. Patient taking differently: Take 240 mg by mouth every morning.  08/26/14  Yes Josalyn C Funches, MD  cholecalciferol (VITAMIN D) 1000 UNITS tablet Take 1 tablet (1,000 Units total) by mouth daily. Patient not taking: Reported on 09/07/2014 08/26/14   Minerva Ends, MD  Insulin Syringe-Needle U-100 (BD  INSULIN SYRINGE ULTRAFINE) 31G X 15/64" 1 ML MISC 1 each by Does not apply route 4 (four) times daily. 08/26/14   Josalyn C Funches, MD  lidocaine (LIDODERM) 5 % Place 1 patch onto the skin daily. Remove & Discard patch within 12 hours or as directed by MD Patient not taking: Reported on 09/07/2014 08/26/14   Minerva Ends, MD  Vitamin D, Ergocalciferol, (DRISDOL) 50000 UNITS CAPS capsule Take 1 capsule (50,000 Units total) by mouth every 7 (seven) days. Patient taking differently: Take 50,000 Units by mouth every 7 (seven) days. on Friday. 08/28/14   Josalyn C Funches, MD   BP 142/87 mmHg  Pulse 98  Temp(Src) 98.1 F (36.7 C) (Oral)  Resp 15  SpO2 97%  LMP 08/31/2014 Physical Exam  Constitutional: She appears well-developed and well-nourished.  No distress.  HENT:  Head: Normocephalic and atraumatic.  Eyes: Conjunctivae and EOM are normal. Right eye exhibits no discharge. Left eye exhibits no discharge.  Neck: No JVD present.  Cardiovascular: Normal rate, regular rhythm and normal heart sounds.   2+ radial pulses equal bilaterally. No swelling or tenderness to bilateral lower extremities.  Pulmonary/Chest: Effort normal and breath sounds normal. No respiratory distress.  Diffuse wheezing with rales and rhonchi with decreased air movement. No accessory muscle use. Mild/moderate respiratory distress.  Abdominal: Soft. Bowel sounds are normal. She exhibits no distension. There is no tenderness.  Musculoskeletal:  Mild right arm swelling at the elbow with 2-3 cm circular area of redness to dorsal elbow. No warmth noted.  Neurological: She is alert. She exhibits normal muscle tone. Coordination normal.  Patient with full range of motion of right elbow with pain in full extension. Strength and sensation intact.  Skin: Skin is warm and dry. She is not diaphoretic.  Nursing note and vitals reviewed.   ED Course  Procedures (including critical care time) Labs Review Labs Reviewed  CBC  - Abnormal; Notable for the following:    WBC 14.7 (*)    RDW 16.4 (*)    Platelets 420 (*)    All other components within normal limits  BASIC METABOLIC PANEL - Abnormal; Notable for the following:    Glucose, Bld 321 (*)    GFR calc non Af Amer 66 (*)    GFR calc Af Amer 77 (*)    All other components within normal limits  CBG MONITORING, ED - Abnormal; Notable for the following:    Glucose-Capillary 172 (*)    All other components within normal limits  I-STAT TROPOININ, ED    Imaging Review Dg Chest 2 View (if Patient Has Fever And/or Copd)  09/07/2014   CLINICAL DATA:  Productive cough for 1 day  EXAM: CHEST  2 VIEW  COMPARISON:  None.  FINDINGS: Cardiac shadow is within normal limits. The lungs are well aerated bilaterally. Some patchy infiltrative changes are noted in the left mid lung projecting in the left upper lobe on the lateral projection consistent with early infiltrate. No bony abnormality is seen.  IMPRESSION: Mild early left upper lung infiltrate.   Electronically Signed   By: Inez Catalina M.D.   On: 09/07/2014 18:54   Dg Elbow Complete Right  09/07/2014   CLINICAL DATA:  Posterior elbow pain radiating laterally. Initial encounter. No trauma.  EXAM: RIGHT ELBOW - COMPLETE 3+ VIEW  COMPARISON:  None.  FINDINGS: There is no evidence of fracture, dislocation, or joint effusion. There is no evidence of arthropathy or other focal bone abnormality. Soft tissues are unremarkable.  IMPRESSION: Negative.   Electronically Signed   By: Jeannine Boga M.D.   On: 09/07/2014 21:00     EKG Interpretation None       Date: 09/08/2014  Rate: 122  Rhythm: sinus tachycardia  QRS Axis: normal  Intervals: PR 160ms QT/QTc 310/441 ms  ST/T Wave abnormalities: normal  Conduction Disutrbances:none  Narrative Interpretation:   Old EKG Reviewed: none available   MDM   Final diagnoses:  Elbow pain  CAP (community acquired pneumonia)  History of diabetes mellitus   Patient  with past medical history of diabetes, COPD, chronic lung disease followed by our pulmonology on chronic steroids presenting with subjective fevers, productive cough, shortness of breath. At presentation patient tachycardic. Lungs with diffuse wheezing rales or rhonchi with diminished air movement. Mild/moderate respiratory distress. Only  mild improvement with DuoNeb in ED. Patient given IV fluids with improvement of her tachycardia. However still elevated at rest. Patient diagnosed with community acquired pneumonia on CXR. Concern for patients limited improvement on my exam as well as pulmonary history on chronic steroids. Consult to hospitalist. Spoke with Dr. Hal Hope who agrees with plan for admission with IV abx. Levaquin started in ED.  Discussed return precautions with patient. Discussed all results and patient verbalizes understanding and agrees with plan.  This is a shared patient. This patient was discussed with the physician, Dr. Alvino Chapel who saw and evaluated the patient and agrees with the plan.       Pura Spice, PA-C 09/08/14 [place] Alvino Chapel, MD 09/10/14 2108

## 2014-09-07 NOTE — H&P (Signed)
[facility] History and Physical  Raven Turner QHU:[id number] DOB: 10/15/1970 DOA: 09/07/2014  Referring physician: ER physician. PCP: Minerva Ends, MD   Chief Complaint: Persistent productive cough.  Spanish translation provided by patient's husband.  HPI: Raven Turner is a 43 y.o. female with history of asthma/COPD, chronic prednisone therapy for asthma with Cushing's features, diabetes mellitus type 2, hypertension was brought to the ER after patient is having persistent productive cough for the last 2 days. In the ER patient was found to be persistently wheezing with bilateral Coast breath sounds. Chest x-ray shows new infiltrates and patient has been admitted for further management of pneumonia with asthma/COPD exacerbation. Patient denies any chest pain fever chills did have one episode of nausea vomiting otherwise denies any abdominal pain or diarrhea. Patient was recently advised to discontinue theophylline by her pulmonologist but patient wants to continue it. Patient's blood sugar was found to be elevated and stays at all recently has been running high.   Review of Systems: As presented in the history of presenting illness, rest negative.  Past Medical History  Diagnosis Date  . Asthma   . Cushing syndrome 2013   . Depression   . Anemia   . Osteopenia 11/19/2013    Dx with osteopenia in lumbar vertebra with partial compression of L1  . Diabetes 2013   . Diabetes mellitus with neuropathy   . HTN (hypertension) 2005   . COPD (chronic obstructive pulmonary disease) 2013  . Compression fracture    Past Surgical History  Procedure Laterality Date  . Cataract extraction Bilateral 2014  . Vaginal hysterectomy     Social History:  reports that she has never smoked. She has never used smokeless tobacco. She reports that she does not drink alcohol or use illicit drugs. Where does patient live home. Can patient participate in ADLs? Yes.  Allergies   Allergen Reactions  . Shellfish Allergy Anaphylaxis    Family History:  Family History  Problem Relation Age of Onset  . Diabetes Mother   . Stroke Father   . Asthma Father       Prior to Admission medications   Medication Sig Start Date End Date Taking? Authorizing Provider  albuterol (PROVENTIL) (2.5 MG/3ML) 0.083% nebulizer solution Take 3 mLs (2.5 mg total) by nebulization every 6 (six) hours as needed for wheezing or shortness of breath. Patient taking differently: Take 2.5 mg by nebulization at bedtime.  08/26/14  Yes Minerva Ends, MD  aspirin 325 MG tablet Take 1 tablet (325 mg total) by mouth daily. 08/26/14  Yes Josalyn C Funches, MD  budesonide (PULMICORT) 0.25 MG/2ML nebulizer solution Take 0.25 mg by nebulization 2 (two) times daily.   Yes Historical Provider, MD  clonazePAM (KLONOPIN) 0.5 MG tablet Take 1 tablet (0.5 mg total) by mouth 2 (two) times daily as needed for anxiety. Patient taking differently: Take 0.5 mg by mouth at bedtime.  08/26/14  Yes Josalyn C Funches, MD  cloNIDine (CATAPRES) 0.2 MG tablet Take 1 tablet (0.2 mg total) by mouth 2 (two) times daily. Patient taking differently: Take 0.2 mg by mouth at bedtime.  08/26/14  Yes Josalyn C Funches, MD  ferrous sulfate 325 (65 FE) MG tablet Take 1 tablet (325 mg total) by mouth daily with breakfast. 08/26/14  Yes Josalyn C Funches, MD  folic acid (FOLVITE) 1 MG tablet Take 1 tablet (1 mg total) by mouth daily. 08/26/14  Yes Josalyn C Funches, MD  furosemide (LASIX) 20 MG tablet Take  1 tablet (20 mg total) by mouth daily. 08/26/14  Yes Josalyn C Funches, MD  gabapentin (NEURONTIN) 800 MG tablet Take 1 tablet (800 mg total) by mouth 2 (two) times daily. 08/26/14  Yes Josalyn C Funches, MD  insulin glargine (LANTUS) 100 UNIT/ML injection Inject 0.7 mLs (70 Units total) into the skin at bedtime. 08/26/14  Yes Josalyn C Funches, MD  insulin lispro (HUMALOG) 100 UNIT/ML injection Inject 0.25 mLs (25 Units total)  into the skin 3 (three) times daily with meals. 08/26/14  Yes Josalyn C Funches, MD  losartan (COZAAR) 100 MG tablet Take 1 tablet (100 mg total) by mouth daily. 08/26/14  Yes Josalyn C Funches, MD  metFORMIN (GLUCOPHAGE) 500 MG tablet Take 1 tablet (500 mg total) by mouth 2 (two) times daily with a meal. Patient taking differently: Take 500 mg by mouth daily with breakfast.  08/26/14  Yes Josalyn C Funches, MD  mometasone-formoterol (DULERA) 100-5 MCG/ACT AERO Take 2 puffs first thing in am and then another 2 puffs about 12 hours later. 09/04/14  Yes Tanda Rockers, MD  montelukast (SINGULAIR) 10 MG tablet Take 1 tablet (10 mg total) by mouth at bedtime. 08/26/14  Yes Josalyn C Funches, MD  omeprazole (PRILOSEC) 20 MG capsule Take 1 capsule (20 mg total) by mouth daily. 08/26/14  Yes Minerva Ends, MD  [place] Calcium 500 MG TABS Take 0.4 tablets (500 mg total) by mouth 2 (two) times daily after a meal. 08/26/14  Yes Josalyn C Funches, MD  PARoxetine (PAXIL) 10 MG tablet Take 1 tablet (10 mg total) by mouth daily. 08/26/14  Yes Josalyn C Funches, MD  predniSONE (DELTASONE) 20 MG tablet Take 2 tablets (40 mg total) by mouth daily with breakfast. 08/26/14  Yes Josalyn C Funches, MD  ranitidine (ZANTAC) 300 MG tablet Take 1 tablet (300 mg total) by mouth at bedtime. 08/26/14  Yes Josalyn C Funches, MD  theophylline (THEODUR) 300 MG 12 hr tablet Take 300 mg by mouth 2 (two) times daily.   Yes Historical Provider, MD  traMADol (ULTRAM) 50 MG tablet Take 1 tablet (50 mg total) by mouth every 8 (eight) hours as needed. Patient taking differently: Take 50 mg by mouth at bedtime.  08/26/14  Yes Josalyn C Funches, MD  verapamil (COVERA HS) 240 MG (CO) 24 hr tablet Take 1 tablet (240 mg total) by mouth at bedtime. Patient taking differently: Take 240 mg by mouth every morning.  08/26/14  Yes Josalyn C Funches, MD  cholecalciferol (VITAMIN D) 1000 UNITS tablet Take 1 tablet (1,000 Units total) by mouth  daily. Patient not taking: Reported on 09/07/2014 08/26/14   Minerva Ends, MD  Insulin Syringe-Needle U-100 (BD INSULIN SYRINGE ULTRAFINE) 31G X 15/64" 1 ML MISC 1 each by Does not apply route 4 (four) times daily. 08/26/14   Josalyn C Funches, MD  lidocaine (LIDODERM) 5 % Place 1 patch onto the skin daily. Remove & Discard patch within 12 hours or as directed by MD Patient not taking: Reported on 09/07/2014 08/26/14   Minerva Ends, MD  Vitamin D, Ergocalciferol, (DRISDOL) 50000 UNITS CAPS capsule Take 1 capsule (50,000 Units total) by mouth every 7 (seven) days. Patient taking differently: Take 50,000 Units by mouth every 7 (seven) days. on Friday. 08/28/14   Minerva Ends, MD    Physical Exam: Filed Vitals:   09/07/14 2208 09/07/14 2230 09/07/14 2315 09/07/14 2334  BP: 151/95 151/94 164/95 164/95  Pulse: 97 99 102   Temp:  98.1 F (36.7 C)     TempSrc: Oral     Resp: 20 15 19    SpO2: 97% 98% 96%      General:  Well-developed and nourished.  Eyes: Anicteric no pallor.  ENT: No discharge from the ears eyes nose and mouth.  Neck: No mass felt. No JVD appreciated.  Cardiovascular: S1-S2 heard tachycardic.  Respiratory: Bilateral expiratory wheeze and coarse breath sounds.  Abdomen: Soft nontender bowel sounds present.  Skin: No rash.  Musculoskeletal: No edema.  Psychiatric: Appears normal.  Neurologic: Alert awake oriented to time place and person. Moves all extremities.  Labs on Admission:  Basic Metabolic Panel:  Recent Labs Lab 09/07/14 1754  NA 140  K 4.1  CL 102  CO2 25  GLUCOSE 321*  BUN 13  CREATININE 1.03  CALCIUM 10.1   Liver Function Tests: No results for input(s): AST, ALT, ALKPHOS, BILITOT, PROT, ALBUMIN in the last 168 hours. No results for input(s): LIPASE, AMYLASE in the last 168 hours. No results for input(s): AMMONIA in the last 168 hours. CBC:  Recent Labs Lab 09/07/14 1754  WBC 14.7*  HGB 12.8  HCT 40.7  MCV 83.1   PLT 420*   Cardiac Enzymes: No results for input(s): CKTOTAL, CKMB, CKMBINDEX, TROPONINI in the last 168 hours.  BNP (last 3 results) No results for input(s): PROBNP in the last 8760 hours. CBG:  Recent Labs Lab 09/07/14 2326  GLUCAP 172*    Radiological Exams on Admission: Dg Chest 2 View (if Patient Has Fever And/or Copd)  09/07/2014   CLINICAL DATA:  Productive cough for 1 day  EXAM: CHEST  2 VIEW  COMPARISON:  None.  FINDINGS: Cardiac shadow is within normal limits. The lungs are well aerated bilaterally. Some patchy infiltrative changes are noted in the left mid lung projecting in the left upper lobe on the lateral projection consistent with early infiltrate. No bony abnormality is seen.  IMPRESSION: Mild early left upper lung infiltrate.   Electronically Signed   By: Inez Catalina M.D.   On: 09/07/2014 18:54   Dg Elbow Complete Right  09/07/2014   CLINICAL DATA:  Posterior elbow pain radiating laterally. Initial encounter. No trauma.  EXAM: RIGHT ELBOW - COMPLETE 3+ VIEW  COMPARISON:  None.  FINDINGS: There is no evidence of fracture, dislocation, or joint effusion. There is no evidence of arthropathy or other focal bone abnormality. Soft tissues are unremarkable.  IMPRESSION: Negative.   Electronically Signed   By: Jeannine Boga M.D.   On: 09/07/2014 21:00    EKG: Independently reviewed. Sinus tachycardia.  Assessment/Plan Principal Problem:   Pneumonia Active Problems:   HTN (hypertension)   Diabetes mellitus type 2, uncontrolled   Asthma exacerbation   Hypertension   1. Pneumonia - community-acquired. Patient has been placed on Levaquin. Check urine Legionella and strep antigen and check also influenza PCR. 2. Asthma/COPD exacerbation - patient is already on a regular larger dose of prednisone which will be continued. Patient has been placed on Pulmicort and Xopenex. 3. Sinus tachycardia - probably from #1 and 2. Check theophylline level and TSH. 4. Diabetes  mellitus type 2 uncontrolled - check hemoglobin A1c and closely follow CBGs. Have ordered 1 dose of patient's home dose of Lantus now. 5. Hypertension - continue home medications.   DVT Prophylaxis Lovenox.  Code Status: Full code.  Family Communication: Patient's husband at the bedside.  Disposition Plan: Admit to inpatient.    Tiago Humphrey N. [facility] Pager [phone].  If  7PM-7AM, please contact night-coverage [email or web address] Password [email or web address] 09/07/2014, 11:40 PM

## 2014-09-08 DIAGNOSIS — Z789 Other specified health status: Secondary | ICD-10-CM | POA: Diagnosis present

## 2014-09-08 HISTORY — DX: Other specified health status: Z78.9

## 2014-09-08 HISTORY — DX: Morbid (severe) obesity due to excess calories: E66.01

## 2014-09-08 LAB — EXPECTORATED SPUTUM ASSESSMENT W GRAM STAIN, RFLX TO RESP C

## 2014-09-08 LAB — COMPREHENSIVE METABOLIC PANEL
ALT: 120 U/L — ABNORMAL HIGH (ref 0–35)
AST: 68 U/L — ABNORMAL HIGH (ref 0–37)
Albumin: 3.1 g/dL — ABNORMAL LOW (ref 3.5–5.2)
Alkaline Phosphatase: 133 U/L — ABNORMAL HIGH (ref 39–117)
Anion gap: 11 (ref 5–15)
BUN: 10 mg/dL (ref 6–23)
CO2: 26 mmol/L (ref 19–32)
Calcium: 9.1 mg/dL (ref 8.4–10.5)
Chloride: 102 mEq/L (ref 96–112)
Creatinine, Ser: 0.76 mg/dL (ref 0.50–1.10)
GFR calc Af Amer: >90 mL/min (ref >90)
GFR calc non Af Amer: >90 mL/min (ref >90)
Glucose, Bld: 220 mg/dL — ABNORMAL HIGH (ref 70–99)
Potassium: 3.5 mmol/L (ref 3.5–5.1)
Sodium: 139 mmol/L (ref 135–145)
Total Bilirubin: 0.3 mg/dL (ref 0.3–1.2)
Total Protein: 6.2 g/dL (ref 6.0–8.3)

## 2014-09-08 LAB — CBC WITH DIFFERENTIAL/PLATELET
Basophils Absolute: 0.1 10*3/uL (ref 0.0–0.1)
Basophils Relative: 1 % (ref 0–1)
Eosinophils Absolute: 0.1 10*3/uL (ref 0.0–0.7)
Eosinophils Relative: 0 % (ref 0–5)
HCT: 36.2 % (ref 36.0–46.0)
Hemoglobin: 11.3 g/dL — ABNORMAL LOW (ref 12.0–15.0)
Lymphocytes Relative: 14 % (ref 12–46)
Lymphs Abs: 1.7 10*3/uL (ref 0.7–4.0)
MCH: 25.9 pg — ABNORMAL LOW (ref 26.0–34.0)
MCHC: 31.2 g/dL (ref 30.0–36.0)
MCV: 82.8 fL (ref 78.0–100.0)
Monocytes Absolute: 0.9 10*3/uL (ref 0.1–1.0)
Monocytes Relative: 7 % (ref 3–12)
Neutro Abs: 9.4 10*3/uL — ABNORMAL HIGH (ref 1.7–7.7)
Neutrophils Relative %: 78 % — ABNORMAL HIGH (ref 43–77)
Platelets: 364 10*3/uL (ref 150–400)
RBC: 4.37 MIL/uL (ref 3.87–5.11)
RDW: 16.2 % — ABNORMAL HIGH (ref 11.5–15.5)
WBC: 12.2 10*3/uL — ABNORMAL HIGH (ref 4.0–10.5)

## 2014-09-08 LAB — BRAIN NATRIURETIC PEPTIDE: B Natriuretic Peptide: 53.3 pg/mL (ref 0.0–100.0)

## 2014-09-08 LAB — INFLUENZA PANEL BY PCR (TYPE A & B)
H1N1 flu by pcr: NOT DETECTED
Influenza A By PCR: NEGATIVE
Influenza B By PCR: NEGATIVE

## 2014-09-08 LAB — GLUCOSE, CAPILLARY
Glucose-Capillary: 131 mg/dL — ABNORMAL HIGH (ref 70–99)
Glucose-Capillary: 315 mg/dL — ABNORMAL HIGH (ref 70–99)
Glucose-Capillary: 323 mg/dL — ABNORMAL HIGH (ref 70–99)
Glucose-Capillary: 224 mg/dL — ABNORMAL HIGH (ref 70–99)

## 2014-09-08 LAB — HIV ANTIBODY (ROUTINE TESTING W REFLEX): HIV 1&2 Ab, 4th Generation: NONREACTIVE

## 2014-09-08 LAB — THEOPHYLLINE LEVEL: Theophylline Lvl: 7.3 ug/mL — ABNORMAL LOW (ref 10.0–20.0)

## 2014-09-08 LAB — EXPECTORATED SPUTUM ASSESSMENT W REFEX TO RESP CULTURE

## 2014-09-08 LAB — TSH: TSH: 2.626 u[IU]/mL (ref 0.350–4.500)

## 2014-09-08 LAB — STREP PNEUMONIAE URINARY ANTIGEN: Strep Pneumo Urinary Antigen: NEGATIVE

## 2014-09-08 MED ORDER — FAMOTIDINE 20 MG PO TABS
20.0000 mg | ORAL_TABLET | Freq: Two times a day (BID) | ORAL | Status: DC
  Administered 2014-09-08 – 2014-09-09 (×4): 20 mg via ORAL
  Filled 2014-09-08 (×4): qty 1

## 2014-09-08 MED ORDER — THEOPHYLLINE ER 300 MG PO TB12
300.0000 mg | ORAL_TABLET | Freq: Two times a day (BID) | ORAL | Status: DC
  Administered 2014-09-08 – 2014-09-09 (×4): 300 mg via ORAL
  Filled 2014-09-08 (×5): qty 1

## 2014-09-08 MED ORDER — FOLIC ACID 1 MG PO TABS
1.0000 mg | ORAL_TABLET | Freq: Every day | ORAL | Status: DC
  Administered 2014-09-08 – 2014-09-09 (×2): 1 mg via ORAL
  Filled 2014-09-08 (×2): qty 1

## 2014-09-08 MED ORDER — METHYLPREDNISOLONE SODIUM SUCC 125 MG IJ SOLR
60.0000 mg | Freq: Two times a day (BID) | INTRAMUSCULAR | Status: DC
  Administered 2014-09-09 (×2): 60 mg via INTRAVENOUS
  Filled 2014-09-08 (×2): qty 2

## 2014-09-08 MED ORDER — BUDESONIDE 0.25 MG/2ML IN SUSP
0.2500 mg | Freq: Two times a day (BID) | RESPIRATORY_TRACT | Status: DC
  Administered 2014-09-08 – 2014-09-09 (×2): 0.25 mg via RESPIRATORY_TRACT
  Filled 2014-09-08 (×4): qty 2

## 2014-09-08 MED ORDER — INSULIN ASPART 100 UNIT/ML ~~LOC~~ SOLN
10.0000 [IU] | Freq: Three times a day (TID) | SUBCUTANEOUS | Status: DC
  Administered 2014-09-09 (×3): 10 [IU] via SUBCUTANEOUS

## 2014-09-08 MED ORDER — VERAPAMIL HCL ER 240 MG PO TBCR
240.0000 mg | EXTENDED_RELEASE_TABLET | Freq: Every day | ORAL | Status: DC
  Filled 2014-09-08 (×2): qty 1

## 2014-09-08 MED ORDER — ASPIRIN 325 MG PO TABS
325.0000 mg | ORAL_TABLET | Freq: Every day | ORAL | Status: DC
  Administered 2014-09-08 – 2014-09-09 (×2): 325 mg via ORAL
  Filled 2014-09-08 (×2): qty 1

## 2014-09-08 MED ORDER — INSULIN ASPART 100 UNIT/ML ~~LOC~~ SOLN
0.0000 [IU] | Freq: Three times a day (TID) | SUBCUTANEOUS | Status: DC
  Administered 2014-09-08: 11 [IU] via SUBCUTANEOUS
  Administered 2014-09-08: 2 [IU] via SUBCUTANEOUS
  Administered 2014-09-08: 11 [IU] via SUBCUTANEOUS

## 2014-09-08 MED ORDER — MONTELUKAST SODIUM 10 MG PO TABS
10.0000 mg | ORAL_TABLET | Freq: Every day | ORAL | Status: DC
  Administered 2014-09-08 (×2): 10 mg via ORAL
  Filled 2014-09-08 (×2): qty 1

## 2014-09-08 MED ORDER — FERROUS SULFATE 325 (65 FE) MG PO TABS
325.0000 mg | ORAL_TABLET | Freq: Every day | ORAL | Status: DC
  Administered 2014-09-08 – 2014-09-09 (×2): 325 mg via ORAL
  Filled 2014-09-08 (×2): qty 1

## 2014-09-08 MED ORDER — INSULIN ASPART 100 UNIT/ML ~~LOC~~ SOLN
0.0000 [IU] | Freq: Three times a day (TID) | SUBCUTANEOUS | Status: DC
  Administered 2014-09-09: 4 [IU] via SUBCUTANEOUS
  Administered 2014-09-09: 11 [IU] via SUBCUTANEOUS
  Administered 2014-09-09: 7 [IU] via SUBCUTANEOUS

## 2014-09-08 MED ORDER — LEVALBUTEROL HCL 0.63 MG/3ML IN NEBU
0.6300 mg | INHALATION_SOLUTION | Freq: Three times a day (TID) | RESPIRATORY_TRACT | Status: DC
  Administered 2014-09-09: 0.63 mg via RESPIRATORY_TRACT
  Filled 2014-09-08 (×2): qty 3

## 2014-09-08 MED ORDER — LOSARTAN POTASSIUM 50 MG PO TABS
100.0000 mg | ORAL_TABLET | Freq: Every day | ORAL | Status: DC
  Administered 2014-09-08 – 2014-09-09 (×2): 100 mg via ORAL
  Filled 2014-09-08 (×2): qty 2

## 2014-09-08 MED ORDER — TRAMADOL HCL 50 MG PO TABS
50.0000 mg | ORAL_TABLET | Freq: Three times a day (TID) | ORAL | Status: DC | PRN
  Administered 2014-09-08: 50 mg via ORAL
  Filled 2014-09-08: qty 1

## 2014-09-08 MED ORDER — PANTOPRAZOLE SODIUM 40 MG PO TBEC
40.0000 mg | DELAYED_RELEASE_TABLET | Freq: Every day | ORAL | Status: DC
  Administered 2014-09-08 – 2014-09-09 (×2): 40 mg via ORAL
  Filled 2014-09-08 (×2): qty 1

## 2014-09-08 MED ORDER — LEVALBUTEROL HCL 0.63 MG/3ML IN NEBU
0.6300 mg | INHALATION_SOLUTION | Freq: Four times a day (QID) | RESPIRATORY_TRACT | Status: DC | PRN
  Filled 2014-09-08: qty 3

## 2014-09-08 MED ORDER — LEVOFLOXACIN IN D5W 750 MG/150ML IV SOLN
750.0000 mg | INTRAVENOUS | Status: DC
  Administered 2014-09-08 – 2014-09-09 (×2): 750 mg via INTRAVENOUS
  Filled 2014-09-08 (×3): qty 150

## 2014-09-08 MED ORDER — BUDESONIDE 0.25 MG/2ML IN SUSP: 0.2500 mg | Freq: Two times a day (BID) | RESPIRATORY_TRACT | Status: DC

## 2014-09-08 MED ORDER — VERAPAMIL HCL ER 240 MG PO TBCR
240.0000 mg | EXTENDED_RELEASE_TABLET | Freq: Every day | ORAL | Status: DC
  Administered 2014-09-08 – 2014-09-09 (×2): 240 mg via ORAL
  Filled 2014-09-08 (×2): qty 1

## 2014-09-08 MED ORDER — PREDNISONE 20 MG PO TABS
40.0000 mg | ORAL_TABLET | Freq: Every day | ORAL | Status: DC
  Administered 2014-09-08: 40 mg via ORAL
  Filled 2014-09-08: qty 2

## 2014-09-08 MED ORDER — ENOXAPARIN SODIUM 40 MG/0.4ML ~~LOC~~ SOLN
40.0000 mg | Freq: Every day | SUBCUTANEOUS | Status: DC
  Administered 2014-09-08 – 2014-09-09 (×2): 40 mg via SUBCUTANEOUS
  Filled 2014-09-08 (×2): qty 0.4

## 2014-09-08 MED ORDER — VITAMIN D (ERGOCALCIFEROL) 1.25 MG (50000 UNIT) PO CAPS: 50000.0000 [IU] | ORAL_CAPSULE | ORAL | Status: DC

## 2014-09-08 MED ORDER — GABAPENTIN 400 MG PO CAPS
800.0000 mg | ORAL_CAPSULE | Freq: Two times a day (BID) | ORAL | Status: DC
  Administered 2014-09-08 – 2014-09-09 (×4): 800 mg via ORAL
  Filled 2014-09-08 (×4): qty 2

## 2014-09-08 MED ORDER — CLONAZEPAM 0.5 MG PO TABS
0.5000 mg | ORAL_TABLET | Freq: Two times a day (BID) | ORAL | Status: DC | PRN
  Administered 2014-09-08 (×2): 0.5 mg via ORAL
  Filled 2014-09-08 (×2): qty 1

## 2014-09-08 MED ORDER — SODIUM CHLORIDE 0.9 % IJ SOLN
3.0000 mL | Freq: Two times a day (BID) | INTRAMUSCULAR | Status: DC
  Administered 2014-09-08 – 2014-09-09 (×4): 3 mL via INTRAVENOUS

## 2014-09-08 MED ORDER — ENSURE COMPLETE PO LIQD
237.0000 mL | Freq: Two times a day (BID) | ORAL | Status: DC
  Administered 2014-09-08: 237 mL via ORAL

## 2014-09-08 MED ORDER — INSULIN ASPART 100 UNIT/ML ~~LOC~~ SOLN
0.0000 [IU] | Freq: Every day | SUBCUTANEOUS | Status: DC
  Administered 2014-09-08: 2 [IU] via SUBCUTANEOUS

## 2014-09-08 MED ORDER — FUROSEMIDE 20 MG PO TABS
20.0000 mg | ORAL_TABLET | Freq: Every day | ORAL | Status: DC
  Administered 2014-09-08 – 2014-09-09 (×2): 20 mg via ORAL
  Filled 2014-09-08 (×2): qty 1

## 2014-09-08 MED ORDER — PAROXETINE HCL 20 MG PO TABS
10.0000 mg | ORAL_TABLET | Freq: Every day | ORAL | Status: DC
  Administered 2014-09-09: 10 mg via ORAL
  Filled 2014-09-08: qty 0.5

## 2014-09-08 NOTE — Progress Notes (Signed)
Pt admitted to room 4N17.  Pt is alert and oriented. Pt speaks broken english and husband is at bedside to translate.  It was explained to pt she would be on droplet precautions to rule out flu and meningitis.  Will continue to monitor.

## 2014-09-08 NOTE — Progress Notes (Signed)
ANTIBIOTIC CONSULT NOTE - INITIAL  Pharmacy Consult for Levaquin  Indication: rule out pneumonia  Allergies  Allergen Reactions  . Shellfish Allergy Anaphylaxis    Patient Measurements: Height: 4\' 11"  (149.9 cm) Weight: 181 lb 10.5 oz (82.4 kg) IBW/kg (Calculated) : 43.2  Vital Signs: Temp: 98.1 F (36.7 C) (12/27 2208) Temp Source: Oral (12/27 2208) BP: 142/87 mmHg (12/27 2346) Pulse Rate: 98 (12/27 2346)  Labs:  Recent Labs  09/07/14 1754  WBC 14.7*  HGB 12.8  PLT 420*  CREATININE 1.03   Estimated Creatinine Clearance: 66.2 mL/min (by C-G formula based on Cr of 1.03).  Medical History: Past Medical History  Diagnosis Date  . Asthma   . Cushing syndrome 2013   . Depression   . Anemia   . Osteopenia 11/19/2013    Dx with osteopenia in lumbar vertebra with partial compression of L1  . Diabetes 2013   . Diabetes mellitus with neuropathy   . HTN (hypertension) 2005   . COPD (chronic obstructive pulmonary disease) 2013  . Compression fracture     Assessment: 43 y/o F with productive cough, elevated WBC, early infiltrate on CXR, renal function good, other labs as above.   Plan:  -Levaquin 750 mg IV q24h -Trend WBC, temp, renal function  -PO as able  Narda Bonds 09/08/2014,12:32 AM

## 2014-09-08 NOTE — Progress Notes (Signed)
Nutrition Brief Note  Patient identified on the Malnutrition Screening Tool (MST) Report  Wt Readings from Last 15 Encounters:  09/08/14 181 lb 10.5 oz (82.4 kg)  09/04/14 180 lb (81.647 kg)  08/26/14 181 lb (82.101 kg)    Body mass index is 36.67 kg/(m^2). Patient meets criteria for Obesity based on current BMI. Pt reports that she used to weigh 205 lbs when she lived in [place] and lost some weight (about 6 months ago) due to frequent hospitalizations. She reports that she has been eating healthier and drinking more water recently and has altogether lost 26 lbs. She reports that her appetite is good and she eats well.   Current diet order is Heart Healthy/Carb Modified, patient is consuming approximately 75-100% of meals at this time. Labs and medications reviewed.   No nutrition interventions warranted at this time. If nutrition issues arise, please consult RD.   Pryor Ochoa RD, LDN Inpatient Clinical Dietitian Pager: [phone] After Hours Pager: [phone]

## 2014-09-08 NOTE — Progress Notes (Signed)
CARE MANAGEMENT NOTE 09/08/2014  Patient:  Raven Turner   Account Number:  [id number]  Date Initiated:  09/08/2014  Documentation initiated by:  Olga Coaster  Subjective/Objective Assessment:   ADMITTED WITH PNEUMONIA     Action/Plan:   CM FOLLOWING FOR DCP   Anticipated DC Date:  09/12/2014   Anticipated DC Plan:  [place]  CM consult         Status of service:  In process, will continue to follow Medicare Important Message given?   (If response is "NO", the following Medicare IM given date fields will be blank)  Per UR Regulation:  Reviewed for med. necessity/level of care/duration of stay  Comments:  12/28/2015Mindi Slicker RN,BSN,MHA [phone]

## 2014-09-08 NOTE — Progress Notes (Signed)
PROGRESS NOTE  Raven Turner SWN:[id number] DOB: [birth year] DOA: 09/07/2014 PCP: Minerva Ends, MD  HPI/Recap of past 24 hours: Patient is a 43 year old Hispanic female, non-English speaking with past history of asthma on chronic prednisone, diabetes mellitus and hypertension who was admitted to the hospitalist service on the night of 12/27 for several days of persistent productive cough, wheezing and shortness of breath. Patient was noted in the emergency room to have a mild leukocytosis as well as an early developing left upper lobe pneumonia. Patient started on nebulizers, oxygen and antibiotics.  Today, she feels only minimally better. Still significant amount of wheezing and nonproductive cough. She complains of feeling very tired.  Assessment/Plan: Principal Problem:   Acute respiratory failure/Asthma exacerbation/Pneumonia: Continue IV Levaquin, add incentive spirometer, changed by mouth prednisone to IV Solu-Medrol given significant wheezing. Patient and mildly tachycardic so adding back oxygen.  Added nebulizers Active Problems:   HTN (hypertension): Continue home meds   Diabetes mellitus type 2, uncontrolled with peripheral neuropathy: Continue Lantus, scheduled NovoLog plus sliding scale plus Neurontin    Hypertension: Continue home meds   Morbid obesity: Patient meets criteria with BMI greater than 40.    Non-English speaking patient: Optometrist used   Code Status: Full code  Family Communication: Son at the bedside  Disposition Plan: Home once able to take by mouth antibiotics, breathing much improved   Consultants:  None  Procedures:  None  Antibiotics:  IV Levaquin 12/27-present   Objective: BP 116/78 mmHg  Pulse 99  Temp(Src) 98 F (36.7 C) (Oral)  Resp 20  Ht 4' 11"  (1.499 m)  Wt 82.4 kg (181 lb 10.5 oz)  BMI 36.67 kg/m2  SpO2 99%  LMP 08/31/2014  Intake/Output Summary (Last 24 hours) at 09/08/14 2042 Last data filed at 09/08/14  0950  Gross per 24 hour  Intake      0 ml  Output    400 ml  Net   -400 ml   Filed Weights   09/08/14 0031  Weight: 82.4 kg (181 lb 10.5 oz)    Exam:   General:  Alert and oriented 3, no acute distress  Cardiovascular: Regular rate and rhythm, S1-S2, borderline tachycardia  Respiratory: Bilateral expiratory wheezing with rhonchi  Abdomen: Soft, obese, nontender, positive bowel sounds  Musculoskeletal: No clubbing or cyanosis, trace edema   Data Reviewed: Basic Metabolic Panel:  Recent Labs Lab 09/07/14 1754 09/08/14 0306  NA 140 139  K 4.1 3.5  CL 102 102  CO2 25 26  GLUCOSE 321* 220*  BUN 13 10  CREATININE 1.03 0.76  CALCIUM 10.1 9.1   Liver Function Tests:  Recent Labs Lab 09/08/14 0306  AST 68*  ALT 120*  ALKPHOS 133*  BILITOT 0.3  PROT 6.2  ALBUMIN 3.1*   No results for input(s): LIPASE, AMYLASE in the last 168 hours. No results for input(s): AMMONIA in the last 168 hours. CBC:  Recent Labs Lab 09/07/14 1754 09/08/14 0306  WBC 14.7* 12.2*  NEUTROABS  --  9.4*  HGB 12.8 11.3*  HCT 40.7 36.2  MCV 83.1 82.8  PLT 420* 364   Cardiac Enzymes:   No results for input(s): CKTOTAL, CKMB, CKMBINDEX, TROPONINI in the last 168 hours. BNP (last 3 results) No results for input(s): PROBNP in the last 8760 hours. CBG:  Recent Labs Lab 09/07/14 2326 09/08/14 0649 09/08/14 1159 09/08/14 1627  GLUCAP 172* 131* 315* 323*    Recent Results (from the past 240 hour(s))  Culture, sputum-assessment  Status: None   Collection Time: 09/08/14  2:17 AM  Result Value Ref Range Status   Specimen Description SPUTUM  Final   Special Requests NONE  Final   Sputum evaluation   Final    THIS SPECIMEN IS ACCEPTABLE. RESPIRATORY CULTURE REPORT TO FOLLOW.   Report Status 09/08/2014 FINAL  Final     Studies: Dg Chest 2 View (if Patient Has Fever And/or Copd)  09/07/2014   CLINICAL DATA:  Productive cough for 1 day  EXAM: CHEST  2 VIEW  COMPARISON:  None.   FINDINGS: Cardiac shadow is within normal limits. The lungs are well aerated bilaterally. Some patchy infiltrative changes are noted in the left mid lung projecting in the left upper lobe on the lateral projection consistent with early infiltrate. No bony abnormality is seen.  IMPRESSION: Mild early left upper lung infiltrate.   Electronically Signed   By: Inez Catalina M.D.   On: 09/07/2014 18:54   Dg Elbow Complete Right  09/07/2014   CLINICAL DATA:  Posterior elbow pain radiating laterally. Initial encounter. No trauma.  EXAM: RIGHT ELBOW - COMPLETE 3+ VIEW  COMPARISON:  None.  FINDINGS: There is no evidence of fracture, dislocation, or joint effusion. There is no evidence of arthropathy or other focal bone abnormality. Soft tissues are unremarkable.  IMPRESSION: Negative.   Electronically Signed   By: Jeannine Boga M.D.   On: 09/07/2014 21:00    Scheduled Meds: . aspirin  325 mg Oral Daily  . budesonide  0.25 mg Nebulization BID  . cloNIDine  0.2 mg Oral QHS  . enoxaparin (LOVENOX) injection  40 mg Subcutaneous Daily  . famotidine  20 mg Oral BID  . ferrous sulfate  325 mg Oral Q breakfast  . folic acid  1 mg Oral Daily  . furosemide  20 mg Oral Daily  . gabapentin  800 mg Oral BID  . [START ON 09/09/2014] insulin aspart  0-20 Units Subcutaneous TID WC  . insulin aspart  0-5 Units Subcutaneous QHS  . [START ON 09/09/2014] insulin aspart  10 Units Subcutaneous TID WC  . insulin glargine  70 Units Subcutaneous QHS  . levofloxacin (LEVAQUIN) IV  750 mg Intravenous Q24H  . losartan  100 mg Oral Daily  . methylPREDNISolone (SOLU-MEDROL) injection  60 mg Intravenous Q12H  . montelukast  10 mg Oral QHS  . pantoprazole  40 mg Oral Daily  . PARoxetine  10 mg Oral Daily  . sodium chloride  3 mL Intravenous Q12H  . theophylline  300 mg Oral BID  . verapamil  240 mg Oral Daily  . [START ON 09/12/2014] Vitamin D (Ergocalciferol)  50,000 Units Oral Q Fri    Continuous Infusions:     Time spent: 35 minutes  [facility] Pager [phone]. If 7PM-7AM, please contact night-coverage at [email or web address], password [facility] 09/08/2014, 8:42 PM  LOS: 1 day

## 2014-09-09 LAB — BASIC METABOLIC PANEL
Anion gap: 14 (ref 5–15)
BUN: 12 mg/dL (ref 6–23)
CO2: 20 mmol/L (ref 19–32)
Calcium: 9.1 mg/dL (ref 8.4–10.5)
Chloride: 105 mEq/L (ref 96–112)
Creatinine, Ser: 0.73 mg/dL (ref 0.50–1.10)
GFR calc Af Amer: >90 mL/min (ref >90)
GFR calc non Af Amer: >90 mL/min (ref >90)
Glucose, Bld: 155 mg/dL — ABNORMAL HIGH (ref 70–99)
Potassium: 3.2 mmol/L — ABNORMAL LOW (ref 3.5–5.1)
Sodium: 139 mmol/L (ref 135–145)

## 2014-09-09 LAB — CBC
HCT: 38.1 % (ref 36.0–46.0)
Hemoglobin: 11.9 g/dL — ABNORMAL LOW (ref 12.0–15.0)
MCH: 25.8 pg — ABNORMAL LOW (ref 26.0–34.0)
MCHC: 31.2 g/dL (ref 30.0–36.0)
MCV: 82.5 fL (ref 78.0–100.0)
Platelets: 350 10*3/uL (ref 150–400)
RBC: 4.62 MIL/uL (ref 3.87–5.11)
RDW: 16.3 % — ABNORMAL HIGH (ref 11.5–15.5)
WBC: 12 10*3/uL — ABNORMAL HIGH (ref 4.0–10.5)

## 2014-09-09 LAB — GLUCOSE, CAPILLARY
Glucose-Capillary: 163 mg/dL — ABNORMAL HIGH (ref 70–99)
Glucose-Capillary: 272 mg/dL — ABNORMAL HIGH (ref 70–99)
Glucose-Capillary: 223 mg/dL — ABNORMAL HIGH (ref 70–99)

## 2014-09-09 LAB — LEGIONELLA ANTIGEN, URINE

## 2014-09-09 MED ORDER — PREDNISONE 20 MG PO TABS: ORAL_TABLET | ORAL | Status: DC

## 2014-09-09 MED ORDER — LEVOFLOXACIN 500 MG PO TABS: 500.0000 mg | ORAL_TABLET | Freq: Every day | ORAL | Status: AC

## 2014-09-09 NOTE — Discharge Instructions (Signed)
Asma °(Asthma) °El asma es una enfermedad de los pulmones en la que las vías respiratorias se estrechan y obstruyen. El asma puede causar dificultad para respirar. El asma no puede curarse, pero los medicamentos y los cambios en el estilo de vida pueden ayudar a controlarla. Los siguientes factores pueden iniciar (desencadenar) el asma: °· Escamas de la piel de los animales (caspa). °· Polvo. °· Cucarachas. °· Polen. °· Moho. °· Humo. °· Productos de limpieza. °· Aerosoles para el cabello. °· Vapores de pintura u olores fuertes. °· Aire frío, cambios climáticos y vientos. °· Llanto o risa intensos. °· Estrés. °· Ciertos medicamentos o drogas. °· Alimentos, como frutas secas, papas fritas y vinos espumantes. °· Infecciones o afecciones (resfríos, gripe). °· Haga actividad física. °· Ciertas enfermedades o afecciones. °· Ejercicio o actividades extenuantes. °CUIDADOS EN EL HOGAR  °· Tome los medicamentos como le indicó el médico. °· Use un medidor de flujo espiratorio máximo como le indicó su médico. El medidor de flujo espiratorio máximo es una herramienta que mide el funcionamiento de los pulmones. °· Anote y lleve un registro de los valores del medidor de flujo espiratorio máximo. °· Conozca el plan de acción para el asma y úselo. El plan de acción para el asma es una planificación por escrito para el control y tratamiento de sus crisis asmáticas. °· Para prevenir las crisis asmáticas: °¨ No fume. Aléjese del humo de otros fumadores. °¨ Cambie el filtro de la calefacción y del aire acondicionado con frecuencia. °¨ Limite el uso de hogares o estufas a leña. °¨ Elimine las plagas (como cucarachas, ratones) y sus excrementos. °¨ Elimine las plantas si observa moho en ellas. °¨ Limpie los pisos. Elimine el polvo regularmente. Use productos de limpieza que sean inoloros. °¨ Pídale a alguien que pase la aspiradora cuando usted no se encuentre en casa. Utilice una aspiradora con filtros HEPA, siempre que le sea  posible. °¨ Reemplace las alfombras por pisos de madera, baldosas o vinilo. Las alfombras pueden retener las escamas de la piel de los animales y el polvo. °¨ Use almohadas, mantas y cubre colchones antialérgicos. °¨ Lave las sábanas y las mantas todas las semanas con agua caliente y séquelas con aire caliente. °¨ Use mantas de poliéster o algodón. °¨ Limpie baños y cocinas con lavandina. Si fuera posible, pídale a alguien que vuelva a pintar las paredes de estas habitaciones con una pintura resistente a los hongos. Aléjese de las habitaciones que se están limpiando y pintando. °¨ Lávese las manos con frecuencia. °SOLICITE AYUDA SI: °· Hace un silbido al respirar (sibilancias), tiene falta de aire o tiene tos incluso después de tomar los medicamentos para prevenir crisis. °· El moco coloreado que elimina (esputo) es más denso de lo normal. °· El moco coloreado que elimina cambia de trasparente o blanco a amarillo, verde, gris o sanguinolento. °· Tiene problemas causados por el medicamento que toma, por ejemplo: °¨ Una erupción. °¨ Picazón. °¨ Hinchazón. °¨ Problemas respiratorios. °· Necesita un medicamento de alivio más de 2 a 3 veces por semana. °· El flujo espiratorio máximo aún está en 50 a 79 % del mejor valor personal después de seguir el plan de acción durante 1 hora. °· Tiene fiebre. °SOLICITE AYUDA DE INMEDIATO SI:  °· Parece empeorar y no responde al medicamento durante una crisis asmática. °· Le falta el aire, incluso en reposo. °· Le falta el aire incluso cuando hace muy poca actividad física. °· Tiene dificultad para comer, beber o hablar. °· Siente dolor en el   pecho.  La frecuencia cardaca est acelerada.  Sus labios o uas comienzan a Environmental education officer.  Usted se siente mareado, dbil o se desmaya.  Su flujo mximo es Garment/textile technologist del 50% del Pharmacist, hospital personal. ASEGRESE DE QUE:   Comprende estas instrucciones.  Controlar su afeccin.  Recibir ayuda de inmediato si no mejora o si  empeora. Document Released: 11/25/2008 Document Revised: 01/13/2014 [facility] Patient Information 2015 [place]. This information is not intended to replace advice given to you by your health care provider. Make sure you discuss any questions you have with your health care provider. Neumona (Pneumonia) La neumona es una infeccin en los pulmones.  CAUSAS La neumona puede estar causada por una bacteria o un virus. Generalmente, estas infecciones estn causadas por la aspiracin de partculas infecciosas que ingresan a los pulmones (vas respiratorias). [place].  Cristy Hilts.  Dolor en el pecho.  Frecuencia respiratoria aumentada.  Sibilancias.  Produccin de mucosidad. DIAGNSTICO  Si presenta los sntomas comunes de la neumona, el mdico normalmente confirmar el diagnstico con una radiografa de trax. Si tiene neumona, la radiografa mostrar una anomala en el pulmn (infiltrados pulmonares). Podrn realizarse otras pruebas de [place], [place] o esputo para encontrar la causa especfica de su neumona. El mdico tambin puede Investment banker, corporate (como gases en sangre o una oximetra de pulso) para verificar el correcto funcionamiento de los pulmones. TRATAMIENTO  Algunos tipos de neumona pueden contagiarse a otras personas al toser o Brewing technologist. Es posible que le pidan que utilice una mscara antes y Social worker. Si la neumona est causada por una bacteria, puede tratarse con medicamentos antibiticos. Si la neumona es causada por el virus de la gripe, puede tratarse con medicamentos antivirales. La [employer] de las dems infecciones virales deben seguir su curso. Estas infecciones no respondern a los antibiticos.  INSTRUCCIONES PARA EL CUIDADO EN EL HOGAR   Los inhibidores de la tos pueden utilizarse si no [employer] Sin embargo, la tos, al [employer], brinda una proteccin. Debe evitar, en lo posible, utilizar medicamentos para detener la  tos.  Es posible que el mdico le haya recetado medicamentos si cree que la causa de su neumona es una bacteria o gripe. Finalice los [employer], aunque comience a Sports administrator.  El mdico tambin puede haberle recetado un expectorante. Este afloja la mucosidad, para poder eliminarla con la tos.  Tome los medicamentos solamente como se lo haya indicado el mdico.  No fume. El fumar es una de las causas ms frecuentes de bronquitis y puede contribuir a la neumona. Si es fumador y Airline pilot, la tos puede durar varias semanas despus de que la neumona haya desaparecido.  Un vaporizador o humidificador con vapor fro en la habitacin o en la casa puede ayudar a aflojar la mucosidad.  La tos generalmente empeora por la noche. Duerma semisentado en Yvetta Coder o use un par de almohadas debajo de la cabeza.  Haga reposo todo el tiempo que lo necesite. El organismo por lo general le har saber si tiene ganas de descansar. PREVENCIN La vacuna antineumocccica est disponible para prevenir la neumona bacteriana comn. Habitualmente, se recomienda para:  Personas mayores de 65 aos.  Pacientes que estn en tratamiento de quimioterapia.  Personas con trastornos pulmonares crnicos, como bronquitis o enfisema.  Personas con problemas del sistema inmunolgico. Si usted es mayor de 1 aos o tiene un trastorno que lo pone en situacin de Public affairs consultant, es posible que reciba una vacuna antineumoccica,  si todava no la tiene. En algunos pases, tambin se recomienda la aplicacin de rutina de la vacuna contra la gripe. Esta vacuna puede ayudar a prevenir algunos casos de neumona. Es posible que le ofrezcan aplicarse la vacuna contra la gripe como parte del [place]. Si fuma, es el momento de abandonar el hbito. Puede recibir instrucciones acerca de cmo dejar de fumar. El mdico puede darle medicamentos y asesoramiento para ayudarlo. SOLICITE ATENCIN MDICA SI: Jaclynn Guarneri. [place] DE INMEDIATO SI:   La enfermedad empeora. Esto vale especialmente en el caso de que usted sea una persona mayor o se encuentre dbil por otra enfermedad.  No puede controlar la tos con antitusivos y no puede dormir debido a Presenter, broadcasting.  Comienza a escupir sangre al toser.  El dolor empeora o no puede controlarlo con los medicamentos.  Alguno de los sntomas que inicialmente lo llevaron a la Publishing rights manager en vez de mejorar.  Siente falta de aire o Tourist information centre manager. ASEGRESE DE QUE:   Comprende estas instrucciones.  Controlar su afeccin.  Recibir ayuda de inmediato si no mejora o si empeora. Document Released: 06/08/2005 Document Revised: 01/13/2014 [facility] Patient Information 2015 [place]. This information is not intended to replace advice given to you by your health care provider. Make sure you discuss any questions you have with your health care provider.

## 2014-09-09 NOTE — Progress Notes (Signed)
MD paged regarding lab values. No new orders.

## 2014-09-09 NOTE — Discharge Summary (Addendum)
Discharge Summary  Raven Turner ZOX:[id number] DOB: July 19, 1971  PCP: Minerva Ends, MD  Admit date: 09/07/2014 Discharge date: 09/09/2014  Time spent: 35 minutes  Recommendations for Outpatient Follow-up:  1. New medication: Prednisone taper which after 1 week cruise back to her baseline of 40 mg daily 2. New medication: As per her recommendation by her pulmonologist, theophylline been discontinued  3. Advised the patient is to follow-up with her pulmonologist in the next 1-2 weeks.  Discharge Diagnoses:  Active Hospital Problems   Diagnosis Date Noted  . Pneumonia 09/07/2014  . Morbid obesity 09/08/2014  . Non-English speaking patient 09/08/2014  . Diabetes mellitus type 2, uncontrolled 09/07/2014  . Asthma exacerbation 09/07/2014  . Hypertension 09/07/2014  . HTN (hypertension)     Resolved Hospital Problems   Diagnosis Date Noted Date Resolved  No resolved problems to display.    Discharge Condition: Improved, being discharged home  Diet recommendation: Low-sodium, carb modified  Filed Weights   09/08/14 0031  Weight: 82.4 kg (181 lb 10.5 oz)    History of present illness:  Patient is a 43 year old Hispanic female, non-English speaking with past history of asthma on chronic prednisone, diabetes mellitus and hypertension who was admitted to the hospitalist service on the night of 12/27 for several days of persistent productive cough, wheezing and shortness of breath. Patient was noted in the emergency room to have a mild leukocytosis as well as an early developing left upper lobe pneumonia. Patient started on nebulizers, oxygen and antibiotics.  Hospital Course:  Principal Problem:  Acute respiratory failure/Asthma exacerbation/Pneumonia: Continue IV Levaquin, add incentive spirometer, changed by mouth prednisone to IV Solu-Medrol given significant wheezing. Patient and mildly tachycardic so adding back oxygen. Added nebulizers. By 12/29, patient looked to  be significantly better and off of oxygen. With ambulation, she contraction saturations at 96% although she did have some significant tachycardia. Part of this looked to be from pain from her hip which is chronic. There is also some question based off of her pulmonologist note that the weight she takes her inhalers to be causing her to do more harm than good. As per their recommendation, theophylline discontinued. Patient will continue on 5 more days of by mouth Levaquin to complete a seven-day course for this pneumonia Active Problems:  HTN (hypertension): Continue home meds  Diabetes mellitus type 2, uncontrolled with peripheral neuropathy: Continue Lantus, scheduled NovoLog plus sliding scale plus Neurontin. With sodium Medrol, CBGs even more elevated and patient required increased dose of Lantus and sliding scale   Hypertension: Continue home meds  Morbid obesity: Patient meets criteria with BMI greater than 40.   Non-English speaking patient: Optometrist used Procedures:  None  Consultations:  None  Discharge Exam: BP 119/71 mmHg  Pulse 88  Temp(Src) 98.5 F (36.9 C) (Oral)  Resp 20  Ht 4' 11"  (1.499 m)  Wt 82.4 kg (181 lb 10.5 oz)  BMI 36.67 kg/m2  SpO2 98%  LMP 08/31/2014  General: Alert & oriented x 3 Cardiovascular: RRR, S1S2-borderline tachycardia Respiratory: Bilateral expiratory wheeze, much improved from previous  Discharge Instructions You were cared for by a hospitalist during your hospital stay. If you have any questions about your discharge medications or the care you received while you were in the hospital after you are discharged, you can call the unit and asked to speak with the hospitalist on call if the hospitalist that took care of you is not available. Once you are discharged, your primary care physician will handle any  further medical issues. Please note that NO REFILLS for any discharge medications will be authorized once you are discharged, as it is  imperative that you return to your primary care physician (or establish a relationship with a primary care physician if you do not have one) for your aftercare needs so that they can reassess your need for medications and monitor your lab values.    Medication List    STOP taking these medications        theophylline 300 MG 12 hr tablet  Commonly known as:  THEODUR      TAKE these medications        albuterol (2.5 MG/3ML) 0.083% nebulizer solution  Commonly known as:  PROVENTIL  Take 3 mLs (2.5 mg total) by nebulization every 6 (six) hours as needed for wheezing or shortness of breath.     aspirin 325 MG tablet  Take 1 tablet (325 mg total) by mouth daily.     cholecalciferol 1000 UNITS tablet  Commonly known as:  VITAMIN D  Take 1 tablet (1,000 Units total) by mouth daily.     clonazePAM 0.5 MG tablet  Commonly known as:  KLONOPIN  Take 1 tablet (0.5 mg total) by mouth 2 (two) times daily as needed for anxiety.     cloNIDine 0.2 MG tablet  Commonly known as:  CATAPRES  Take 1 tablet (0.2 mg total) by mouth 2 (two) times daily.     ferrous sulfate 325 (65 FE) MG tablet  Take 1 tablet (325 mg total) by mouth daily with breakfast.     folic acid 1 MG tablet  Commonly known as:  FOLVITE  Take 1 tablet (1 mg total) by mouth daily.     furosemide 20 MG tablet  Commonly known as:  LASIX  Take 1 tablet (20 mg total) by mouth daily.     gabapentin 800 MG tablet  Commonly known as:  NEURONTIN  Take 1 tablet (800 mg total) by mouth 2 (two) times daily.     insulin glargine 100 UNIT/ML injection  Commonly known as:  LANTUS  Inject 0.7 mLs (70 Units total) into the skin at bedtime.     insulin lispro 100 UNIT/ML injection  Commonly known as:  HUMALOG  Inject 0.25 mLs (25 Units total) into the skin 3 (three) times daily with meals.     Insulin Syringe-Needle U-100 31G X 15/64" 1 ML Misc  Commonly known as:  BD INSULIN SYRINGE ULTRAFINE  1 each by Does not apply route 4  (four) times daily.     levofloxacin 500 MG tablet  Commonly known as:  LEVAQUIN  Take 1 tablet (500 mg total) by mouth daily.     lidocaine 5 %  Commonly known as:  LIDODERM  Place 1 patch onto the skin daily. Remove & Discard patch within 12 hours or as directed by MD     losartan 100 MG tablet  Commonly known as:  COZAAR  Take 1 tablet (100 mg total) by mouth daily.     metFORMIN 500 MG tablet  Commonly known as:  GLUCOPHAGE  Take 1 tablet (500 mg total) by mouth 2 (two) times daily with a meal.     mometasone-formoterol 100-5 MCG/ACT Aero  Commonly known as:  DULERA  Take 2 puffs first thing in am and then another 2 puffs about 12 hours later.     montelukast 10 MG tablet  Commonly known as:  SINGULAIR  Take 1 tablet (10 mg total)  by mouth at bedtime.     omeprazole 20 MG capsule  Commonly known as:  PRILOSEC  Take 1 capsule (20 mg total) by mouth daily.     Oyster Shell Calcium 500 MG Tabs  Take 0.4 tablets (500 mg total) by mouth 2 (two) times daily after a meal.     PARoxetine 10 MG tablet  Commonly known as:  PAXIL  Take 1 tablet (10 mg total) by mouth daily.     predniSONE 20 MG tablet  Commonly known as:  DELTASONE  Take 60 mg po bid x 3 days, then 41m po daily x 3 days, then resume 462mdaily.     PULMICORT 0.25 MG/2ML nebulizer solution  Generic drug:  budesonide  Take 0.25 mg by nebulization 2 (two) times daily.     ranitidine 300 MG tablet  Commonly known as:  ZANTAC  Take 1 tablet (300 mg total) by mouth at bedtime.     traMADol 50 MG tablet  Commonly known as:  ULTRAM  Take 1 tablet (50 mg total) by mouth every 8 (eight) hours as needed.     verapamil 240 MG (CO) 24 hr tablet  Commonly known as:  COVERA HS  Take 1 tablet (240 mg total) by mouth at bedtime.     Vitamin D (Ergocalciferol) 50000 UNITS Caps capsule  Commonly known as:  DRISDOL  Take 1 capsule (50,000 Units total) by mouth every 7 (seven) days.       Discharge Instructions      Diet - low sodium heart healthy    Complete by:  As directed      Increase activity slowly    Complete by:  As directed           . Allergies  Allergen Reactions  . Shellfish Allergy Anaphylaxis       Follow-up Information    Follow up with FUMinerva EndsMD In 1 month.   Specialty:  Family Medicine   Contact information:   [id number] [phone]     Follow up with MiChristinia GullyMD In 1 week.   Specialty:  Pulmonary Disease   Contact information:   [address] [phone]      The results of significant diagnostics from this hospitalization (including imaging, microbiology, ancillary and laboratory) are listed below for reference.    Significant Diagnostic Studies: Dg Chest 2 View (if Patient Has Fever And/or Copd)  09/07/2014   CLINICAL DATA:  Productive cough for 1 day  EXAM: CHEST  2 VIEW  COMPARISON:  None.  FINDINGS: Cardiac shadow is within normal limits. The lungs are well aerated bilaterally. Some patchy infiltrative changes are noted in the left mid lung projecting in the left upper lobe on the lateral projection consistent with early infiltrate. No bony abnormality is seen.  IMPRESSION: Mild early left upper lung infiltrate.   Electronically Signed   By: MaInez Catalina.D.   On: 09/07/2014 18:54   Dg Lumbar Spine Complete  08/27/2014   CLINICAL DATA:  Low back pain. No known injury. History of L1 compression fracture.  EXAM: LUMBAR SPINE - COMPLETE 4+ VIEW  COMPARISON:  None.  FINDINGS: Mild compression fracture involving the T12 vertebral body. Mild depression of the superior endplates of L1 through L5. These are age-indeterminate presumably on. Normal alignment. SI joints are symmetric and unremarkable.  IMPRESSION: Mild compression fracture T12. Mild depression of the superior  endplates throughout the lumbar spine.   Electronically Signed   By: Rolm Baptise M.D.   On: 08/27/2014 08:50   Dg  Elbow Complete Right  09/07/2014   CLINICAL DATA:  Posterior elbow pain radiating laterally. Initial encounter. No trauma.  EXAM: RIGHT ELBOW - COMPLETE 3+ VIEW  COMPARISON:  None.  FINDINGS: There is no evidence of fracture, dislocation, or joint effusion. There is no evidence of arthropathy or other focal bone abnormality. Soft tissues are unremarkable.  IMPRESSION: Negative.   Electronically Signed   By: Jeannine Boga M.D.   On: 09/07/2014 21:00    Microbiology: Recent Results (from the past 240 hour(s))  Culture, sputum-assessment     Status: None   Collection Time: 09/08/14  2:17 AM  Result Value Ref Range Status   Specimen Description SPUTUM  Final   Special Requests NONE  Final   Sputum evaluation   Final    THIS SPECIMEN IS ACCEPTABLE. RESPIRATORY CULTURE REPORT TO FOLLOW.   Report Status 09/08/2014 FINAL  Final  Culture, respiratory (NON-Expectorated)     Status: None (Preliminary result)   Collection Time: 09/08/14  2:17 AM  Result Value Ref Range Status   Specimen Description SPUTUM  Final   Special Requests NONE  Final   Gram Stain   Final    FEW WBC PRESENT, PREDOMINANTLY PMN NO SQUAMOUS EPITHELIAL CELLS SEEN RARE GRAM POSITIVE COCCI IN PAIRS Performed at [facility]    Culture   Final    NORMAL OROPHARYNGEAL FLORA Performed at [facility]    Report Status PENDING  Incomplete  Culture, blood (routine x 2) Call MD if unable to obtain prior to antibiotics being given     Status: None (Preliminary result)   Collection Time: 09/08/14  3:06 AM  Result Value Ref Range Status   Specimen Description BLOOD RIGHT ANTECUBITAL  Final   Special Requests BOTTLES DRAWN AEROBIC ONLY 10CC  Final   Culture   Final           BLOOD CULTURE RECEIVED NO GROWTH TO DATE CULTURE WILL BE HELD FOR 5 DAYS BEFORE ISSUING A FINAL NEGATIVE REPORT Performed at [facility]    Report Status PENDING  Incomplete  Culture, blood (routine x 2) Call MD if unable to  obtain prior to antibiotics being given     Status: None (Preliminary result)   Collection Time: 09/08/14  3:15 AM  Result Value Ref Range Status   Specimen Description BLOOD RIGHT HAND  Final   Special Requests BOTTLES DRAWN AEROBIC ONLY 10CC  Final   Culture   Final           BLOOD CULTURE RECEIVED NO GROWTH TO DATE CULTURE WILL BE HELD FOR 5 DAYS BEFORE ISSUING A FINAL NEGATIVE REPORT Performed at [facility]    Report Status PENDING  Incomplete     Labs: Basic Metabolic Panel:  Recent Labs Lab 09/07/14 1754 09/08/14 0306 09/09/14 0644  NA 140 139 139  K 4.1 3.5 3.2*  CL 102 102 105  CO2 25 26 20   GLUCOSE 321* 220* 155*  BUN 13 10 12   CREATININE 1.03 0.76 0.73  CALCIUM 10.1 9.1 9.1   Liver Function Tests:  Recent Labs Lab 09/08/14 0306  AST 68*  ALT 120*  ALKPHOS 133*  BILITOT 0.3  PROT 6.2  ALBUMIN 3.1*   No results for input(s): LIPASE, AMYLASE in the last 168 hours. No results for input(s): AMMONIA in the last 168  hours. CBC:  Recent Labs Lab 09/07/14 1754 09/08/14 0306 09/09/14 0644  WBC 14.7* 12.2* 12.0*  NEUTROABS  --  9.4*  --   HGB 12.8 11.3* 11.9*  HCT 40.7 36.2 38.1  MCV 83.1 82.8 82.5  PLT 420* 364 350   Cardiac Enzymes: No results for input(s): CKTOTAL, CKMB, CKMBINDEX, TROPONINI in the last 168 hours. BNP: BNP (last 3 results) No results for input(s): PROBNP in the last 8760 hours. CBG:  Recent Labs Lab 09/08/14 1627 09/08/14 2216 09/09/14 0633 09/09/14 1131 09/09/14 1704  GLUCAP 323* 224* 163* 272* 223*       Signed:  Weronika Birch K  [facility] 09/09/2014, 6:50 PM

## 2014-09-10 LAB — CULTURE, RESPIRATORY

## 2014-09-10 LAB — CULTURE, RESPIRATORY W GRAM STAIN: Culture: NORMAL

## 2014-09-14 LAB — CULTURE, BLOOD (ROUTINE X 2)
Culture: NO GROWTH
Culture: NO GROWTH

## 2014-09-15 ENCOUNTER — Other Ambulatory Visit: Payer: Self-pay | Admitting: Family Medicine

## 2014-09-15 ENCOUNTER — Ambulatory Visit: Payer: Medicaid Other | Attending: Family Medicine | Admitting: Family Medicine

## 2014-09-15 VITALS — BP 149/80 | HR 117 | Temp 98.5°F | Resp 20

## 2014-09-15 DIAGNOSIS — M25521 Pain in right elbow: Secondary | ICD-10-CM | POA: Insufficient documentation

## 2014-09-15 DIAGNOSIS — E1165 Type 2 diabetes mellitus with hyperglycemia: Secondary | ICD-10-CM

## 2014-09-15 LAB — GLUCOSE, POCT (MANUAL RESULT ENTRY): POC Glucose: 241 mg/dl — AB (ref 70–99)

## 2014-09-15 MED ORDER — DOXYCYCLINE HYCLATE 100 MG PO TABS: 100.0000 mg | ORAL_TABLET | Freq: Two times a day (BID) | ORAL | Status: DC

## 2014-09-15 NOTE — Assessment & Plan Note (Signed)
A: pain x 2 weeks w/o injury. Exam concerning for soft tissue infection. No evidence of bursitis or osteo. Reviewed normal x-ray. P: doxycyline 100 mg BID x 10 days  CBC diff Close f/u patient has appt with me in one week

## 2014-09-15 NOTE — Progress Notes (Signed)
Subjective:     Patient ID: Raven Turner, female   DOB: 1970/11/10, 44 y.o.   MRN: [id number] CC: RN visit for BP check, R elbow pain x 1 weeks  HPI 44 yo F with multiple medical problems including IDDM type 2 and cushing syndrome:  1. Rt elbow pain: x one week. No fever. Tender. No trauma. Recently hospitalized and treated and completed course of Levaquin for community acquired pneumonia. Blood culture in hospital was negative.  Soc Hx: non smoker  Review of Systems As per HPI     Objective:   Physical Exam  BP 149/80 mmHg  Pulse 117  Temp(Src) 98.5 F (36.9 C) (Oral)  Resp 20  SpO2 95%  LMP 08/31/2014 General appearance: alert, cooperative and no distress Extremities: R elbow with circular area of erythema and warmth, tender, no fluctuance or swelling. Full ROM in joint.      Assessment and Plan:

## 2014-09-15 NOTE — Progress Notes (Signed)
Patient presents with brother and [facility] Interpreter, Rob Bunting, for BP check Med list reviewed; states taking all meds as directed C/o 2 week hx right elbow "pulsing" pain, warmth and redness; rates 6/10 at present; patient states concerned it might be osteomyelitis  Right elbow x-ray on 09/07/14 negative  CBG 241 5 hours after meal  BP 149/80 P 117 R 20  T  98.5 oral SPO2  95%  PCP in to examine  Patient advised to call for med refills at least 7 days before running out so as not to go without. Patient aware that she is to f/u with PCP 1 month from last visit (Due 09/21/14) Patient states she has an appt scheduled with PCP in 7 days

## 2014-09-16 ENCOUNTER — Ambulatory Visit (INDEPENDENT_AMBULATORY_CARE_PROVIDER_SITE_OTHER)
Admission: RE | Admit: 2014-09-16 | Discharge: 2014-09-16 | Disposition: A | Discharge status: Home / Self Care | Payer: Medicaid Other | Source: Ambulatory Visit | Attending: Internal Medicine | Admitting: Internal Medicine

## 2014-09-16 ENCOUNTER — Ambulatory Visit (INDEPENDENT_AMBULATORY_CARE_PROVIDER_SITE_OTHER): Payer: Medicaid Other | Admitting: Internal Medicine

## 2014-09-16 ENCOUNTER — Encounter: Payer: Self-pay | Admitting: Internal Medicine

## 2014-09-16 VITALS — BP 138/80 | HR 129 | Temp 98.7°F | Ht <= 58 in | Wt 177.0 lb

## 2014-09-16 DIAGNOSIS — R05 Cough: Secondary | ICD-10-CM

## 2014-09-16 DIAGNOSIS — R058 Other specified cough: Secondary | ICD-10-CM

## 2014-09-16 DIAGNOSIS — E249 Cushing's syndrome, unspecified: Secondary | ICD-10-CM

## 2014-09-16 DIAGNOSIS — J45998 Other asthma: Secondary | ICD-10-CM

## 2014-09-16 LAB — CBC WITH DIFFERENTIAL/PLATELET
Basophils Absolute: 0 10*3/uL (ref 0.0–0.1)
Basophils Relative: 0 % (ref 0–1)
Eosinophils Absolute: 0 10*3/uL (ref 0.0–0.7)
Eosinophils Relative: 0 % (ref 0–5)
HCT: 39.3 % (ref 36.0–46.0)
Hemoglobin: 13.4 g/dL (ref 12.0–15.0)
Lymphocytes Relative: 6 % — ABNORMAL LOW (ref 12–46)
Lymphs Abs: 1.1 10*3/uL (ref 0.7–4.0)
MCH: 26.4 pg (ref 26.0–34.0)
MCHC: 34.1 g/dL (ref 30.0–36.0)
MCV: 77.5 fL — ABNORMAL LOW (ref 78.0–100.0)
MPV: 9.6 fL (ref 8.6–12.4)
Monocytes Absolute: 0.7 10*3/uL (ref 0.1–1.0)
Monocytes Relative: 4 % (ref 3–12)
Neutro Abs: 16.7 10*3/uL — ABNORMAL HIGH (ref 1.7–7.7)
Neutrophils Relative %: 90 % — ABNORMAL HIGH (ref 43–77)
Platelets: 441 10*3/uL — ABNORMAL HIGH (ref 150–400)
RBC: 5.07 MIL/uL (ref 3.87–5.11)
RDW: 16 % — ABNORMAL HIGH (ref 11.5–15.5)
WBC: 18.5 10*3/uL — ABNORMAL HIGH (ref 4.0–10.5)

## 2014-09-16 MED ORDER — TRAMADOL HCL 50 MG PO TABS: ORAL_TABLET | ORAL | Status: DC

## 2014-09-16 MED ORDER — FLUTTER DEVI: Status: DC

## 2014-09-16 NOTE — Patient Instructions (Addendum)
Please remember to go to the  x-ray department downstairs for your tests - we will call you with the results when they are available.    Stop theophylline and breathine  Continue dulera 100 Take 2 puffs first thing in am and then another 2 puffs about 12 hours later.  And work on inhaler technique.   As needed: For breathing > ok to use nebulizer up to every 4 hours   For cough/ congestion> use the flutter valve as much as possible plus mucinex dm up 1200 mg every 12 hours supplemented by tramadol up to 50 mg x 2 every 4 hours as needed   See if you schedule Tammy NP next week all medications, solutions, flutter valve bring with you (two separate bags)    Por favor, recuerde que ir al departamento de rayos X de la planta [place] para sus pruebas - nosotros le llamamos con los resultados cuando estn disponibles.  Deje de teofilina y breathine  Continuar Dulera 100 Take 2 inhalaciones a primera hora de la maana y [place] otros 2 soplos sobre 12 horas despus. Y trabajar en la tcnica de inhalacin.  Segn sea necesario: Para respirar> bien usar [employer] cada 4 horas  Para la tos / congestin> utilizar la vlvula de aleteo en lo posible, ms Mucinex dm hasta 1200 mg cada 12 horas complementado por tramadol hasta 50 mg x 2 cada 4 horas, segn sea necesario  Ver si programa Tammy NP prxima semana todos los medicamentos, soluciones, vlvula de [employer] con usted (dos bolsas separadas)

## 2014-09-16 NOTE — Progress Notes (Signed)
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  Onset was childhood but continued to have "good days and bad days" until 2010 and no good days since then even p pred started and no better since arrived in [place] summer 2015     09/04/2014 1st [facility] office visit/ Ismael Karge   Chief Complaint  Patient presents with  . Advice Only    Referred for chronic bronchitis. Recently moved from [place].   Every day's about the same since 2012 admitted to [place] every month but not since arrival in [place]/   maintaining on prednisone ? Dose plus theoph/ brethine/ advair 500 and nebs "all day long"  But interestingly less symptoms nocturnally. Cough is mostly dry assoc with hoarseness  rec Stop the advair, the brethine  and the theophylline Start dulera 100 Take 2 puffs first thing in am and then another 2 puffs about 12 hours later.  Continue prilosec 20 mg Take 30- 60 min before your first and last meals of the day  GERD diet  Only use your albuterol nebulizer   If you find you don't need the nebulizer as much, then we can start reducing your prednisone by 10 mg a week   Admit date: 09/07/2014 Discharge date: 09/09/2014  Discharge Diagnoses:  Active Hospital Problems   Diagnosis Date Noted  . Pneumonia 09/07/2014  . Morbid obesity 09/08/2014  . Non-English speaking patient 09/08/2014  . Diabetes mellitus type 2, uncontrolled 09/07/2014  . Asthma exacerbation 09/07/2014  . Hypertension 09/07/2014  . HTN (hypertension)         History of present illness:  Patient is a 44 year old Hispanic female, non-English speaking with past history of asthma on chronic prednisone, diabetes mellitus and hypertension who  was admitted to the hospitalist service on the night of 12/27 for several days of persistent productive cough, wheezing and shortness of breath. Patient was noted in the emergency room to have a mild leukocytosis as well as an early developing left upper lobe pneumonia. Patient started on nebulizers, oxygen and antibiotics.  Hospital Course:  Principal Problem:  Acute respiratory failure/Asthma exacerbation/Pneumonia: Continue IV Levaquin, add incentive spirometer, changed by mouth prednisone to IV Solu-Medrol given significant wheezing. Patient and mildly tachycardic so adding back oxygen. Added nebulizers. By 12/29, patient looked to be significantly better and off of oxygen. With ambulation, she contraction saturations at 96% although she did have some significant tachycardia. Part of this looked to be from pain from her hip which is chronic. There is also some question based off of her pulmonologist note that the way she takes her inhalers to be causing her to do more harm than good. As per their recommendation, theophylline discontinued. Patient will continue on 5 more days of by mouth Levaquin to complete a seven-day course for this pneumonia Active Problems:  HTN (hypertension): Continue home meds  Diabetes mellitus type 2, uncontrolled with peripheral neuropathy: Continue Lantus, scheduled NovoLog plus sliding scale plus Neurontin. With sodium Medrol, CBGs even more elevated and patient required increased dose of Lantus and sliding scale  Hypertension: Continue home meds  Morbid obesity: Patient meets criteria with BMI greater than 40.      09/16/2014 f/u ov/Raven Turner  re: psueudoasthma vs asthma back on theoph/brethine and pred 40  Chief Complaint  Patient presents with  . HFU    Pt states that her breathing has improved some, but not back to baseline. SHe is still coughing up yellow sputum.    using neb saba avg twice daily, feels theoph helps with "congestion" but able to do adls ok      No obvious day to day or daytime variabilty or assoc  cp or  overt sinus or hb symptoms. No unusual exp hx or h/o childhood pna  or knowledge of premature birth.  Sleeping ok without nocturnal  or early am exacerbation  of respiratory  c/o's or need for noct saba. Also denies any obvious fluctuation of symptoms with weather or environmental changes or other aggravating or alleviating factors except as outlined above   Current Medications, Allergies, Complete Past Medical History, Past Surgical History, Family History, and Social History were reviewed in [employer] record.  ROS  The following are not active complaints unless bolded sore throat, dysphagia, dental problems, itching, sneezing,  nasal congestion or excess/ purulent secretions, ear ache,   fever, chills, sweats, unintended wt loss, pleuritic or exertional cp, hemoptysis,  orthopnea pnd or leg swelling, presyncope, palpitations, heartburn, abdominal pain, anorexia, nausea, vomiting, diarrhea  or change in bowel or urinary habits, change in stools or urine, dysuria,hematuria,  rash, arthralgias, visual complaints, headache, numbness weakness or ataxia or problems with walking or coordination,  change in mood/affect or memory.            Objective:   Physical Exam   Hoarse very cushingnoid amb hispanic female nad but very prominent pseudowheeze    Wt Readings from Last 3 Encounters:  09/04/14 180 lb (81.647 kg)  08/26/14 181 lb (82.101 kg)    Vital signs reviewed  HEENT: nl dentition, turbinates, and orophanx. Nl external ear canals without cough reflex   NECK :  without JVD/Nodes/TM/ nl carotid upstrokes bilaterally   LUNGS: no acc muscle use, clear to A and P bilaterally without cough on insp or exp maneuvers - no wheeze with plm   CV:  RRR  no s3 or murmur or increase in P2, no edema   ABD:  soft and nontender with nl excursion in the supine position. No bruits or organomegaly, bowel sounds  nl  MS:  warm without deformities, calf tenderness, cyanosis or clubbing  SKIN: warm and dry without lesions          CXR PA and Lateral:   09/16/2014 :  Lung volumes are low. Small focus of airspace disease in the left upper lobe seen on the prior study has resolved. Mild basilar atelectasis is noted. No pneumothorax or pleural effusion.     Assessment & Plan:

## 2014-09-17 NOTE — Assessment & Plan Note (Signed)
09/05/2014  try dulera 100 2bid and off advair   DDX of  difficult airways management all start with A and  include Adherence, Ace Inhibitors, Acid Reflux, Active Sinus Disease, Alpha 1 Antitripsin deficiency, Anxiety masquerading as Airways dz,  ABPA,  allergy(esp in young), Aspiration (esp in elderly), Adverse effects of DPI,  Active smokers, plus two Bs  = Bronchiectasis and Beta blocker use..and one C= CHF  Adherence is always the initial "prime suspect" and is a multilayered concern that requires a "trust but verify" approach in every patient - starting with knowing how to use medications, especially inhalers, correctly, keeping up with refills and understanding the fundamental difference between maintenance and prns vs those medications only taken for a very short course and then stopped and not refilled.  - The proper method of use, as well as anticipated side effects, of a metered-dose inhaler are discussed and demonstrated to the patient. Improved effectiveness after extensive coaching during this visit to a level of approximately  75% so continue dulera 100 2bid sample - desperately needs med rec:  To keep things simple, I have asked the patient to first separate medicines that are perceived as maintenance, that is to be taken daily "no matter what", from those medicines that are taken on only on an as-needed basis and I have given the patient examples of both, and then return to see our NP to generate a  detailed  medication calendar which should be followed until the next physician sees the patient and updates it.   ? Acid (or non-acid) GERD > always difficult to exclude as up to 75% of pts in some series report no assoc GI/ Heartburn symptoms> rec continue max (24h)  acid suppression and diet restrictions/ reviewed   And needs trial off theophylline  ? Allergy > for now continue pred at 40 mg daily but move up f/u for med rec and start working on pred taper  ? Anxiety > pseudowheeze still  prominent/ suspect vcd> tryp plm/ flutter      Each maintenance medication was reviewed in detail including most importantly the difference between maintenance and as needed and under what circumstances the prns are to be used.  Please see instructions for details which were reviewed in writing and the patient given a copy.

## 2014-09-17 NOTE — Assessment & Plan Note (Signed)
Completely iatrogenic  The goal with a chronic steroid dependent illness is always arriving at the lowest effective dose that controls the disease/symptoms and not accepting a set "formula" which is based on statistics or guidelines that don't always take into account patient  variability or the natural hx of the dz in every individual patient, which may well vary over time.  For now therefore I recommend the patient maintain  A goal floor of 2.5 mg daily but will need to work slowly on this, first achieving full med rec/compliance / pt buy in and confidence which we don't fully have yet.

## 2014-09-17 NOTE — Assessment & Plan Note (Signed)
09/05/2014  try dulera 100 2bid and off advair    Classic Upper airway cough syndrome, so named because it's frequently impossible to sort out how much is  CR/sinusitis with freq throat clearing (which can be related to primary GERD)   vs  causing  secondary (" extra esophageal")  GERD from wide swings in gastric pressure that occur with throat clearing, often  promoting self use of mint and menthol lozenges that reduce the lower esophageal sphincter tone and exacerbate the problem further in a cyclical fashion.   These are the same pts (now being labeled as having "irritable larynx syndrome" by some cough centers) who not infrequently have a history of having failed to tolerate ace inhibitors,  dry powder inhalers or biphosphonates or report having atypical reflux symptoms that don't respond to standard doses of PPI , and are easily confused as having aecopd or asthma flares by even experienced allergists/ pulmonologists.  rec add back tramadol to control cyclical coughing and just use alb neb for breathing, not coughing, issues

## 2014-09-22 ENCOUNTER — Ambulatory Visit: Payer: Medicaid Other | Attending: Family Medicine | Admitting: Family Medicine

## 2014-09-22 ENCOUNTER — Encounter: Payer: Self-pay | Admitting: Family Medicine

## 2014-09-22 VITALS — BP 156/79 | HR 94 | Temp 98.2°F | Resp 16 | Ht <= 58 in | Wt 178.0 lb

## 2014-09-22 DIAGNOSIS — M549 Dorsalgia, unspecified: Secondary | ICD-10-CM | POA: Diagnosis not present

## 2014-09-22 DIAGNOSIS — M4854XA Collapsed vertebra, not elsewhere classified, thoracic region, initial encounter for fracture: Secondary | ICD-10-CM

## 2014-09-22 DIAGNOSIS — S32010S Wedge compression fracture of first lumbar vertebra, sequela: Secondary | ICD-10-CM

## 2014-09-22 DIAGNOSIS — E119 Type 2 diabetes mellitus without complications: Secondary | ICD-10-CM | POA: Diagnosis not present

## 2014-09-22 DIAGNOSIS — J45998 Other asthma: Secondary | ICD-10-CM

## 2014-09-22 DIAGNOSIS — G8929 Other chronic pain: Secondary | ICD-10-CM | POA: Diagnosis not present

## 2014-09-22 DIAGNOSIS — I10 Essential (primary) hypertension: Secondary | ICD-10-CM

## 2014-09-22 DIAGNOSIS — Z6837 Body mass index (BMI) 37.0-37.9, adult: Secondary | ICD-10-CM | POA: Insufficient documentation

## 2014-09-22 DIAGNOSIS — S22080A Wedge compression fracture of T11-T12 vertebra, initial encounter for closed fracture: Secondary | ICD-10-CM

## 2014-09-22 DIAGNOSIS — E1142 Type 2 diabetes mellitus with diabetic polyneuropathy: Secondary | ICD-10-CM

## 2014-09-22 DIAGNOSIS — E1165 Type 2 diabetes mellitus with hyperglycemia: Secondary | ICD-10-CM

## 2014-09-22 DIAGNOSIS — IMO0002 Reserved for concepts with insufficient information to code with codable children: Secondary | ICD-10-CM

## 2014-09-22 DIAGNOSIS — M25521 Pain in right elbow: Secondary | ICD-10-CM

## 2014-09-22 LAB — GLUCOSE, POCT (MANUAL RESULT ENTRY)
POC Glucose: 316 mg/dl — AB (ref 70–99)
POC Glucose: 274 mg/dl — AB (ref 70–99)

## 2014-09-22 MED ORDER — METFORMIN HCL 500 MG PO TABS: 1000.0000 mg | ORAL_TABLET | Freq: Every day | ORAL | Status: DC

## 2014-09-22 MED ORDER — PREDNISONE 20 MG PO TABS: 40.0000 mg | ORAL_TABLET | Freq: Every day | ORAL | Status: DC

## 2014-09-22 MED ORDER — INSULIN LISPRO 100 UNIT/ML ~~LOC~~ SOLN: 35.0000 [IU] | Freq: Three times a day (TID) | SUBCUTANEOUS | Status: DC

## 2014-09-22 NOTE — Progress Notes (Signed)
   Subjective:    Patient ID: Raven Turner, female    DOB: May 26, 1971, 44 y.o.   MRN: [id number] CC: HTN, DM2, compression fractures, R elbow pain  HPI 44 yo F presents for f/u visit:  1. DM2: complaint with regimen. Not regularly checking CBGs but all are over 300. No low CBGs.  2. HTN: taking clonidine nightly only. Otherwise compliant. No HA or CP/   3. R elbow pain: pain, erythema and edema improved. She stopped doxy but still has a few days left. She reports persistent forearm swelling and induration of proximal radius w/o significant tenderness.   4. Request: ECHO to f/u last one done last year to evaluate RV function. Ortho referral given vertebral compression fractures and chronic back pain.   Soc Hx: non smoker  Review of Systems As per HPI    Objective:   Physical Exam BP 156/79 mmHg  Pulse 94  Temp(Src) 98.2 F (36.8 C) (Oral)  Resp 16  Ht 4\' 10"  (1.473 m)  Wt 178 lb (80.74 kg)  BMI 37.21 kg/m2  SpO2 95%  LMP 08/31/2014  BP Readings from Last 3 Encounters:  09/22/14 156/79  09/16/14 138/80  09/15/14 149/80  General appearance: alert, cooperative, no distress and morbidly obese Lungs: decrease BS b/l, no wheezing or crackles  Heart: regular rate and rhythm, S1, S2 normal, no murmur, click, rub or gallop Extremities: trace LE edema R arm: mild R elbow erythema and induration over proximal radius. Non tender. No fluctuance.        Assessment & Plan:

## 2014-09-22 NOTE — Assessment & Plan Note (Signed)
A: still very poorly controlled with persistent hyperglycemia P: Increase humolog to 35 U three time dialy Increase metformin to 1000 mg in AM.  Log sugars- AM fasting, lunch and dinner.

## 2014-09-22 NOTE — Patient Instructions (Addendum)
Ms. [name],  Thank you for coming back in to see me today.  1. HTN: With breakfast: losartan and verapamil With lunch: clonidine  With supper: clonidine   2. DM2:  Increase humolog to 35 U three time dialy Increase metformin to 1000 mg in AM.  Log sugars- AM fasting, lunch and dinner.   Your ECHO and MRI will be scheduled  Finish the doxycyline for your R elbow  F/u in 2 weeks with RN for blood sugar review  F/u with me in 6 weeks  Dr. Adrian Blackwater

## 2014-09-22 NOTE — Progress Notes (Signed)
Pt is here following up on her HTN, diabetes, asthma, and COPD. Pt states that she has had a cold with congestion and she has coughed so much that she now has a pain in her ribs and chest.

## 2014-09-22 NOTE — Assessment & Plan Note (Signed)
A: multiple compression fractures with chronic steroid use. P: Continue vit D Ortho referral

## 2014-09-22 NOTE — Assessment & Plan Note (Signed)
A: overall clinically improved P: Finish doxycycline MRI to r/o underlying osteo

## 2014-09-22 NOTE — Assessment & Plan Note (Signed)
A: improved, not yet at goal P: With breakfast: losartan and verapamil With lunch: clonidine  With supper: clonidine

## 2014-09-24 ENCOUNTER — Telehealth: Payer: Self-pay | Admitting: Family Medicine

## 2014-09-24 NOTE — Telephone Encounter (Signed)
Patient is calling to check on status of her ECHO and MRI appointment, please f/u with patient with an interpreter.

## 2014-09-26 ENCOUNTER — Ambulatory Visit (INDEPENDENT_AMBULATORY_CARE_PROVIDER_SITE_OTHER): Payer: Medicaid Other | Admitting: Adult Health

## 2014-09-26 ENCOUNTER — Telehealth: Payer: Self-pay | Admitting: Family Medicine

## 2014-09-26 ENCOUNTER — Encounter (INDEPENDENT_AMBULATORY_CARE_PROVIDER_SITE_OTHER): Payer: Self-pay

## 2014-09-26 ENCOUNTER — Encounter: Payer: Self-pay | Admitting: Adult Health

## 2014-09-26 VITALS — BP 130/78 | HR 98 | Ht <= 58 in | Wt 179.0 lb

## 2014-09-26 DIAGNOSIS — J45998 Other asthma: Secondary | ICD-10-CM

## 2014-09-26 DIAGNOSIS — E249 Cushing's syndrome, unspecified: Secondary | ICD-10-CM

## 2014-09-26 DIAGNOSIS — R05 Cough: Secondary | ICD-10-CM

## 2014-09-26 DIAGNOSIS — R058 Other specified cough: Secondary | ICD-10-CM

## 2014-09-26 NOTE — Progress Notes (Signed)
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  Onset was childhood but continued to have "good days and bad days" until 2010 and no good days since then even p pred started and no better since arrived in [place] summer 2015     09/04/2014 1st [facility] office visit/ [facility]   Chief Complaint  Patient presents with  . Advice Only    Referred for chronic bronchitis. Recently moved from [place].   Every day's about the same since 2012 admitted to [place] every month but not since arrival in us/   maintaining on prednisone ? Dose plus theoph/ brethine/ advair 500 and nebs "all day long"  But interestingly less symptoms nocturnally. Cough is mostly dry assoc with hoarseness  rec Stop the advair, the brethine  and the theophylline Start dulera 100 Take 2 puffs first thing in am and then another 2 puffs about 12 hours later.  Continue prilosec 20 mg Take 30- 60 min before your first and last meals of the day  GERD diet  Only use your albuterol nebulizer   If you find you don't need the nebulizer as much, then we can start reducing your prednisone by 10 mg a week   Admit date: 09/07/2014 Discharge date: 09/09/2014  Discharge Diagnoses:  Active Hospital Problems   Diagnosis Date Noted  . Pneumonia 09/07/2014  . Morbid obesity 09/08/2014  . Non-English speaking patient 09/08/2014  . Diabetes mellitus type 2, uncontrolled 09/07/2014  . Asthma exacerbation 09/07/2014  . Hypertension 09/07/2014  . HTN (hypertension)         History of present illness:  Patient is a 44 year old Hispanic female, non-English speaking with past history of asthma on chronic prednisone, diabetes mellitus and hypertension who  was admitted to the hospitalist service on the night of 12/27 for several days of persistent productive cough, wheezing and shortness of breath. Patient was noted in the emergency room to have a mild leukocytosis as well as an early developing left upper lobe pneumonia. Patient started on nebulizers, oxygen and antibiotics.  Hospital Course:  Principal Problem:  Acute respiratory failure/Asthma exacerbation/Pneumonia: Continue IV Levaquin, add incentive spirometer, changed by mouth prednisone to IV Solu-Medrol given significant wheezing. Patient and mildly tachycardic so adding back oxygen. Added nebulizers. By 12/29, patient looked to be significantly better and off of oxygen. With ambulation, she contraction saturations at 96% although she did have some significant tachycardia. Part of this looked to be from pain from her hip which is chronic. There is also some question based off of her pulmonologist note that the way she takes her inhalers to be causing her to do more harm than good. As per their recommendation, theophylline discontinued. Patient will continue on 5 more days of by mouth Levaquin to complete a seven-day course for this pneumonia Active Problems:  HTN (hypertension): Continue home meds  Diabetes mellitus type 2, uncontrolled with peripheral neuropathy: Continue Lantus, scheduled NovoLog plus sliding scale plus Neurontin. With sodium Medrol, CBGs even more elevated and patient required increased dose of Lantus and sliding scale  Hypertension: Continue home meds  Morbid obesity: Patient meets criteria with BMI greater than 40.      09/16/2014 f/u ov/[facility]  re: psueudoasthma vs asthma back on theoph/brethine and pred 40  Chief Complaint  Patient presents with  . HFU    Pt states that her breathing has improved some, but not back to baseline. SHe is still coughing up yellow sputum.    using neb saba avg twice daily, feels theoph helps with "congestion" but able to do adls ok   >d/c  theophylline/breathine   09/26/2014  Follow up and Med Review (Asthma/Steroid dependent)  Interpreter present for today's visit  patient does understand limited English, husband reads and understands more English Patient reports that she's had severe asthma as a child and has been on and off steroids dose of her life She is currently on prednisone 40 mg. Patient has been in the [place] for around 6-7 months Previously treated in [place]. She says she's had several hospitalizations. Most recent hospitalization was 2 weeks ago, she is starting to feel somewhat improved. Patient was seen in office last week. Her theophylline and Brethine was stopped. Patient has brought all of her medications in today for review We reviewed all her medications organize them into a medication count with patient education Patient is on multiple medications from [place] and locally. She is taking both budesonide and Dulera. Patient has had multiple steroid related complications including cataracts, compression fx and  Diabetes.  She is currently being evaluated for right arm swelling. She has MRI and 2-D echo pending. She was treated with doxycycline.  She denies any chest pain, orthopnea, PND, leg swelling, abdominal pain, nausea, vomiting.  Does not have any PFTs on record.    Current Medications, Allergies, Complete Past Medical History, Past Surgical History, Family History, and Social History were reviewed in [employer] record.  ROS  The following are not active complaints unless bolded sore throat, dysphagia, dental problems, itching, sneezing,  nasal congestion or excess/ purulent secretions, ear ache,   fever, chills, sweats, unintended wt loss, pleuritic or exertional cp, hemoptysis,  orthopnea pnd or leg swelling, presyncope, palpitations, heartburn, abdominal pain, anorexia, nausea, vomiting, diarrhea  or change in bowel or urinary habits, change in stools or urine,  dysuria,hematuria,  rash, arthralgias, visual complaints, headache, numbness weakness or ataxia or problems with walking or coordination,  change in mood/affect or memory.            Objective:   Physical Exam   Hoarse very cushingnoid amb hispanic female nad     Vital signs reviewed  HEENT: nl dentition, turbinates, and orophanx. Nl external ear canals without cough reflex   NECK :  without JVD/Nodes/TM/ nl carotid upstrokes bilaterally   LUNGS: no acc muscle use, clear to A and P bilaterally without cough on insp or exp maneuvers - no wheeze with plm   CV:  RRR  no s3 or murmur or increase in P2, no edema   ABD:  soft and nontender with nl excursion in the supine position. No bruits or organomegaly, bowel sounds nl  MS:  warm without deformities, calf tenderness, cyanosis or clubbing  SKIN: warm and dry without lesions          CXR PA and Lateral:   09/16/2014 :  Lung volumes are low. Small focus of airspace disease in the left upper lobe seen on the prior study has resolved. Mild basilar atelectasis is noted. No pneumothorax or pleural effusion.     Assessment & Plan:

## 2014-09-26 NOTE — Assessment & Plan Note (Signed)
Cont to with cough triggers   Plan  Stop Budesonide neb  Decrease Prednisone 74m 2 tabs alternating with 1.5 tabs for 2 weeks then 1.5 tabs daily for 2 weeks and then 1 tab daily and hold at this dose.  Follow med calendar closely and bring to each visit.  Follow up Dr. WMelvyn Novas In 4-6 weeks with PFT  Please contact office for sooner follow up if symptoms do not improve or worsen or seek emergency care

## 2014-09-26 NOTE — Telephone Encounter (Signed)
Echo appointment on Oct 13 2014 at 0900 am arriving 15 min early at [facility] MRI appointment on Oct 20 2014 at 0800 am  arriving 15 min early at [facility] Pt aware appointment

## 2014-09-26 NOTE — Assessment & Plan Note (Signed)
Chronic asthma with recurrent exacerbations Patient needs PFTs to determine the severity of her underlying asthma versus pseudo-asthma She appears to have multiple complications from chronic steroid use. Discussed in detail the need to lower her steroid dosing-will do this at a very slow rate. She is very nervous about going down the prednisone as she relates this to worsening breathing in the past. Patient's medications were reviewed today and patient education was given. Computerized medication calendar was adjusted/completed  Plan  Stop Budesonide neb  Decrease Prednisone 30m 2 tabs alternating with 1.5 tabs for 2 weeks then 1.5 tabs daily for 2 weeks and then 1 tab daily and hold at this dose.  Follow med calendar closely and bring to each visit.  Follow up Dr. WMelvyn Novas In 4-6 weeks with PFT  Please contact office for sooner follow up if symptoms do not improve or worsen or seek emergency care

## 2014-09-26 NOTE — Patient Instructions (Signed)
Stop Budesonide neb  Decrease Prednisone 42m 2 tabs alternating with 1.5 tabs for 2 weeks then 1.5 tabs daily for 2 weeks and then 1 tab daily and hold at this dose.  Follow med calendar closely and bring to each visit.  Follow up Dr. WMelvyn Novas In 4-6 weeks with PFT  Please contact office for sooner follow up if symptoms do not improve or worsen or seek emergency care

## 2014-09-26 NOTE — Telephone Encounter (Signed)
Patient is calling back to check on the status of her ECHO and MRI appointments. Please f/u with pt.

## 2014-09-26 NOTE — Assessment & Plan Note (Signed)
Stop Budesonide neb  Decrease Prednisone 36m 2 tabs alternating with 1.5 tabs for 2 weeks then 1.5 tabs daily for 2 weeks and then 1 tab daily and hold at this dose.  Follow med calendar closely and bring to each visit.  If able to lower steroids, will need to be assess for adrenal insufficiency prior to discontinuation of steroids  Follow up Dr. WMelvyn Novas In 4-6 weeks with PFT  Please contact office for sooner follow up if symptoms do not improve or worsen or seek emergency care

## 2014-09-29 ENCOUNTER — Ambulatory Visit: Payer: Medicaid Other | Admitting: Internal Medicine

## 2014-09-29 NOTE — Addendum Note (Signed)
Addended by: Parke Poisson E on: 09/29/2014 01:25 PM   Modules accepted: Orders, Medications

## 2014-10-01 ENCOUNTER — Telehealth: Payer: Self-pay | Admitting: Family Medicine

## 2014-10-01 ENCOUNTER — Telehealth: Payer: Self-pay | Admitting: *Deleted

## 2014-10-01 NOTE — Telephone Encounter (Signed)
Pt aware, stated still feeling pain

## 2014-10-01 NOTE — Telephone Encounter (Signed)
Please call patient. Medicaid did not approve MRI of arm at this time. MRI cancelled for now but will be ordered if symptoms persist.   Please call radiology to cancel scheduled MRI/also cancel order.

## 2014-10-06 ENCOUNTER — Ambulatory Visit: Payer: Medicaid Other | Attending: Family Medicine | Admitting: *Deleted

## 2014-10-06 VITALS — BP 126/79 | HR 96 | Temp 98.5°F | Resp 20

## 2014-10-06 DIAGNOSIS — Z794 Long term (current) use of insulin: Secondary | ICD-10-CM | POA: Insufficient documentation

## 2014-10-06 DIAGNOSIS — E1165 Type 2 diabetes mellitus with hyperglycemia: Secondary | ICD-10-CM

## 2014-10-06 DIAGNOSIS — I10 Essential (primary) hypertension: Secondary | ICD-10-CM | POA: Diagnosis not present

## 2014-10-06 DIAGNOSIS — E119 Type 2 diabetes mellitus without complications: Secondary | ICD-10-CM | POA: Insufficient documentation

## 2014-10-06 NOTE — Progress Notes (Signed)
Spoke with patient via [employer], [employer], [place]  Patient presents for CBG and record review, however, patient does not record blood sugars  Patient given log and educated on use Patient will begin to check and record blood sugars AM fasting, before lunch and before dinner as previously instructed at last [place] list reviewed; patient reports taking all meds as directed Patient reports AM fasting blood sugars ranging 103-200 Patient reports at bedtime blood sugars ranging 250-400 Declined flu vaccine  CBG 130 AM Fasting BP126/79 P 96 R 20 T98.5 oral SpO2 97%  Per PCP: Continue meds as is  F/u with PCP in 4 weeks  Patient advised to call for med refills at least 7 days before running out so as not to go without. Patient aware that she is to f/u with PCP 6 weeks from last visit (Due 11/03/14)

## 2014-10-10 ENCOUNTER — Telehealth: Payer: Self-pay | Admitting: Family Medicine

## 2014-10-10 ENCOUNTER — Telehealth: Payer: Self-pay | Admitting: Internal Medicine

## 2014-10-10 NOTE — Telephone Encounter (Signed)
Pt advised to call neurology for refills.

## 2014-10-10 NOTE — Telephone Encounter (Signed)
Patient is calling to request Tramadol, and Dulera to be sent to [facility] on [address], she states that the pharmacy did not receive the scripts. Please f/u with pt.

## 2014-10-10 NOTE — Telephone Encounter (Signed)
lmomtcb x1 

## 2014-10-13 ENCOUNTER — Ambulatory Visit (HOSPITAL_COMMUNITY)
Admission: RE | Admit: 2014-10-13 | Discharge: 2014-10-13 | Disposition: A | Discharge status: Home / Self Care | Payer: Medicaid Other | Source: Ambulatory Visit | Attending: Family Medicine | Admitting: Family Medicine

## 2014-10-13 DIAGNOSIS — R0600 Dyspnea, unspecified: Secondary | ICD-10-CM

## 2014-10-13 DIAGNOSIS — I10 Essential (primary) hypertension: Secondary | ICD-10-CM | POA: Diagnosis not present

## 2014-10-13 DIAGNOSIS — E119 Type 2 diabetes mellitus without complications: Secondary | ICD-10-CM | POA: Diagnosis not present

## 2014-10-13 DIAGNOSIS — J45998 Other asthma: Secondary | ICD-10-CM

## 2014-10-13 NOTE — Progress Notes (Signed)
  Echocardiogram 2D Echocardiogram has been performed.  Diamond Nickel 10/13/2014, 9:54 AM

## 2014-10-13 NOTE — Telephone Encounter (Signed)
Spouse calling back pt dulera and she is still out he has a thick accent said his wife cant speak english [phone]

## 2014-10-13 NOTE — Telephone Encounter (Signed)
lmomtcb x1 

## 2014-10-14 ENCOUNTER — Encounter: Payer: Self-pay | Admitting: Family Medicine

## 2014-10-14 ENCOUNTER — Telehealth: Payer: Self-pay | Admitting: *Deleted

## 2014-10-14 DIAGNOSIS — I5189 Other ill-defined heart diseases: Secondary | ICD-10-CM

## 2014-10-14 HISTORY — DX: Other ill-defined heart diseases: I51.89

## 2014-10-14 MED ORDER — MOMETASONE FURO-FORMOTEROL FUM 100-5 MCG/ACT IN AERO: INHALATION_SPRAY | RESPIRATORY_TRACT | Status: DC

## 2014-10-14 NOTE — Telephone Encounter (Signed)
lmtcb x2 

## 2014-10-14 NOTE — Telephone Encounter (Signed)
Unable to contact Pt Phone number not working

## 2014-10-14 NOTE — Telephone Encounter (Signed)
Spoke with pt's sister in law who was translating for pt and advised that refill for [facility] was sent to pharmacy.

## 2014-10-14 NOTE — Telephone Encounter (Signed)
error 

## 2014-10-14 NOTE — Telephone Encounter (Signed)
-----   Message from Minerva Ends, MD sent at 10/14/2014  3:52 PM EST ----- Stable ECHO from the one done prior. Normal pumping function. Increased filling pressures. Grade 2 diastolic dysfunction

## 2014-10-15 ENCOUNTER — Telehealth: Payer: Self-pay | Admitting: Family Medicine

## 2014-10-15 NOTE — Telephone Encounter (Signed)
Pt returning nurse's call. Please f/u with pt.

## 2014-10-16 NOTE — Telephone Encounter (Signed)
Pt aware of ECHO results (results given in Spanish)

## 2014-10-17 ENCOUNTER — Telehealth: Payer: Self-pay | Admitting: Family Medicine

## 2014-10-17 ENCOUNTER — Telehealth: Payer: Self-pay | Admitting: *Deleted

## 2014-10-17 NOTE — Telephone Encounter (Signed)
Case manager called stating that they denied her MRI and will have to cancel her appointment. 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 Please follow up with case worker about mri referral and patient in regards to her appointment cancellation. I am not quite sure when her MRI was scheduled.

## 2014-10-17 NOTE — Telephone Encounter (Signed)
Called Pt F/U elbow pain. Pt stated still with pain to the touch, no redness, no inflammation. Has appointment for MRI on Monday.

## 2014-10-20 ENCOUNTER — Ambulatory Visit (HOSPITAL_COMMUNITY): Payer: Medicaid Other

## 2014-10-23 ENCOUNTER — Other Ambulatory Visit: Payer: Self-pay | Admitting: Family Medicine

## 2014-10-23 NOTE — Telephone Encounter (Signed)
No need for MRI at this time

## 2014-10-23 NOTE — Addendum Note (Signed)
Addended by: Boykin Nearing on: 10/23/2014 06:44 PM   Modules accepted: Orders

## 2014-10-30 ENCOUNTER — Other Ambulatory Visit: Payer: Self-pay | Admitting: Family Medicine

## 2014-10-31 ENCOUNTER — Other Ambulatory Visit: Payer: Self-pay | Admitting: Internal Medicine

## 2014-10-31 DIAGNOSIS — R0600 Dyspnea, unspecified: Secondary | ICD-10-CM

## 2014-11-03 ENCOUNTER — Other Ambulatory Visit: Payer: Self-pay

## 2014-11-03 ENCOUNTER — Ambulatory Visit (HOSPITAL_COMMUNITY)
Admission: RE | Admit: 2014-11-03 | Discharge: 2014-11-03 | Disposition: A | Discharge status: Home / Self Care | Payer: Medicaid Other | Source: Ambulatory Visit | Attending: Cardiology | Admitting: Cardiology

## 2014-11-03 ENCOUNTER — Encounter: Payer: Self-pay | Admitting: Family Medicine

## 2014-11-03 ENCOUNTER — Encounter: Payer: Self-pay | Admitting: Internal Medicine

## 2014-11-03 ENCOUNTER — Ambulatory Visit (INDEPENDENT_AMBULATORY_CARE_PROVIDER_SITE_OTHER): Payer: Medicaid Other | Admitting: Internal Medicine

## 2014-11-03 ENCOUNTER — Ambulatory Visit (HOSPITAL_BASED_OUTPATIENT_CLINIC_OR_DEPARTMENT_OTHER): Payer: Medicaid Other | Admitting: Family Medicine

## 2014-11-03 VITALS — BP 130/80 | HR 130 | Ht <= 58 in | Wt 183.0 lb

## 2014-11-03 VITALS — BP 134/81 | HR 119 | Temp 98.9°F | Resp 16 | Ht <= 58 in | Wt 182.0 lb

## 2014-11-03 DIAGNOSIS — M810 Age-related osteoporosis without current pathological fracture: Secondary | ICD-10-CM

## 2014-11-03 DIAGNOSIS — Z6838 Body mass index (BMI) 38.0-38.9, adult: Secondary | ICD-10-CM

## 2014-11-03 DIAGNOSIS — E1165 Type 2 diabetes mellitus with hyperglycemia: Secondary | ICD-10-CM | POA: Insufficient documentation

## 2014-11-03 DIAGNOSIS — T380X5S Adverse effect of glucocorticoids and synthetic analogues, sequela: Secondary | ICD-10-CM | POA: Insufficient documentation

## 2014-11-03 DIAGNOSIS — R079 Chest pain, unspecified: Secondary | ICD-10-CM | POA: Diagnosis present

## 2014-11-03 DIAGNOSIS — M8580 Other specified disorders of bone density and structure, unspecified site: Secondary | ICD-10-CM

## 2014-11-03 DIAGNOSIS — H547 Unspecified visual loss: Secondary | ICD-10-CM

## 2014-11-03 DIAGNOSIS — Z9849 Cataract extraction status, unspecified eye: Secondary | ICD-10-CM

## 2014-11-03 DIAGNOSIS — IMO0002 Reserved for concepts with insufficient information to code with codable children: Secondary | ICD-10-CM

## 2014-11-03 DIAGNOSIS — E119 Type 2 diabetes mellitus without complications: Secondary | ICD-10-CM

## 2014-11-03 DIAGNOSIS — I519 Heart disease, unspecified: Secondary | ICD-10-CM

## 2014-11-03 DIAGNOSIS — E559 Vitamin D deficiency, unspecified: Secondary | ICD-10-CM

## 2014-11-03 DIAGNOSIS — J45998 Other asthma: Secondary | ICD-10-CM | POA: Diagnosis not present

## 2014-11-03 DIAGNOSIS — Z9841 Cataract extraction status, right eye: Secondary | ICD-10-CM | POA: Insufficient documentation

## 2014-11-03 DIAGNOSIS — I5189 Other ill-defined heart diseases: Secondary | ICD-10-CM

## 2014-11-03 DIAGNOSIS — M8088XS Other osteoporosis with current pathological fracture, vertebra(e), sequela: Secondary | ICD-10-CM | POA: Insufficient documentation

## 2014-11-03 DIAGNOSIS — R0600 Dyspnea, unspecified: Secondary | ICD-10-CM

## 2014-11-03 DIAGNOSIS — Z9842 Cataract extraction status, left eye: Secondary | ICD-10-CM | POA: Insufficient documentation

## 2014-11-03 DIAGNOSIS — M25521 Pain in right elbow: Secondary | ICD-10-CM | POA: Insufficient documentation

## 2014-11-03 HISTORY — DX: Cataract extraction status, unspecified eye: Z98.49

## 2014-11-03 LAB — PULMONARY FUNCTION TEST
DL/VA % pred: 201 %
DL/VA: 6.6 ml/min/mmHg/L
DLCO unc % pred: 164 %
DLCO unc: 19.95 ml/min/mmHg
FEF 25-75 Post: 1.79 L/sec
FEF 25-75 Pre: 1.67 L/sec
FEF2575-%Change-Post: 6 %
FEF2575-%Pred-Post: 68 %
FEF2575-%Pred-Pre: 64 %
FEV1-%Change-Post: 2 %
FEV1-%Pred-Post: 73 %
FEV1-%Pred-Pre: 71 %
FEV1-Post: 1.56 L
FEV1-Pre: 1.52 L
FEV1FVC-%Change-Post: 3 %
FEV1FVC-%Pred-Pre: 99 %
FEV6-%Change-Post: 0 %
FEV6-%Pred-Post: 71 %
FEV6-%Pred-Pre: 72 %
FEV6-Post: 1.83 L
FEV6-Pre: 1.84 L
FEV6FVC-%Pred-Post: 102 %
FEV6FVC-%Pred-Pre: 102 %
FVC-%Change-Post: 0 %
FVC-%Pred-Post: 70 %
FVC-%Pred-Pre: 70 %
FVC-Post: 1.83 L
FVC-Pre: 1.84 L
Post FEV1/FVC ratio: 85 %
Post FEV6/FVC ratio: 100 %
Pre FEV1/FVC ratio: 83 %
Pre FEV6/FVC Ratio: 100 %
RV % pred: 107 %
RV: 1.27 L
TLC % pred: 93 %
TLC: 3.47 L

## 2014-11-03 LAB — BASIC METABOLIC PANEL
BUN: 15 mg/dL (ref 6–23)
CO2: 21 mEq/L (ref 19–32)
Calcium: 10 mg/dL (ref 8.4–10.5)
Chloride: 104 mEq/L (ref 96–112)
Creat: 1.02 mg/dL (ref 0.50–1.10)
Glucose, Bld: 275 mg/dL — ABNORMAL HIGH (ref 70–99)
Potassium: 5 mEq/L (ref 3.5–5.3)
Sodium: 141 mEq/L (ref 135–145)

## 2014-11-03 LAB — GLUCOSE, POCT (MANUAL RESULT ENTRY)
POC Glucose: 297 mg/dl — AB (ref 70–99)
POC Glucose: 284 mg/dl — AB (ref 70–99)

## 2014-11-03 MED ORDER — INSULIN LISPRO 100 UNIT/ML ~~LOC~~ SOLN: 40.0000 [IU] | Freq: Three times a day (TID) | SUBCUTANEOUS | Status: DC

## 2014-11-03 MED ORDER — GLUCOSE BLOOD VI STRP: 1.0000 | ORAL_STRIP | Freq: Four times a day (QID) | Status: DC

## 2014-11-03 MED ORDER — RELION LANCETS ULTRA-THIN 30G MISC: 1.0000 | Freq: Four times a day (QID) | Status: DC

## 2014-11-03 MED ORDER — INSULIN ASPART 100 UNIT/ML ~~LOC~~ SOLN
10.0000 [IU] | Freq: Once | SUBCUTANEOUS | Status: AC
  Administered 2014-11-03: 10 [IU] via SUBCUTANEOUS

## 2014-11-03 MED ORDER — INSULIN GLARGINE 300 UNIT/ML ~~LOC~~ SOPN: 90.0000 [IU] | PEN_INJECTOR | Freq: Every day | SUBCUTANEOUS | Status: DC

## 2014-11-03 MED ORDER — ALBUTEROL SULFATE HFA 108 (90 BASE) MCG/ACT IN AERS: INHALATION_SPRAY | RESPIRATORY_TRACT | Status: DC

## 2014-11-03 MED ORDER — "INSULIN SYRINGE-NEEDLE U-100 31G X 15/64"" 1 ML MISC": 1.0000 | Freq: Three times a day (TID) | Status: DC

## 2014-11-03 MED ORDER — FUROSEMIDE 40 MG PO TABS: 40.0000 mg | ORAL_TABLET | Freq: Every day | ORAL | Status: DC

## 2014-11-03 MED ORDER — PREDNISONE 20 MG PO TABS: 40.0000 mg | ORAL_TABLET | Freq: Every day | ORAL | Status: DC

## 2014-11-03 MED ORDER — ALENDRONATE SODIUM 70 MG PO TABS: 70.0000 mg | ORAL_TABLET | ORAL | Status: DC

## 2014-11-03 MED ORDER — INSULIN PEN NEEDLE 31G X 8 MM MISC: 1.0000 "application " | Freq: Every day | Status: DC

## 2014-11-03 NOTE — Assessment & Plan Note (Signed)
Resolved

## 2014-11-03 NOTE — Assessment & Plan Note (Signed)
L sided chest pains burning in nature: EKG is unchanged from EKG from December, 2015.  Please see cardiologist referral placed Increase lasix from 20 to 40 mg daily for diastolic heart failure

## 2014-11-03 NOTE — Assessment & Plan Note (Signed)
Decreased vision with cataract surgery last year: Referral to ophthalmology placed

## 2014-11-03 NOTE — Progress Notes (Signed)
Patient here to f/u on DM and HTN Patient did bring CBG log PAtient states her sugars have been elevated and that she has a "burning" chest pain that radiates down her left arm Patient says she experienced the pain this am

## 2014-11-03 NOTE — Progress Notes (Signed)
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  Onset was childhood but continued to have "good days and bad days" until 2010 and no good days since then even p pred started and no better since arrived in [place] summer 2015 with no evidence of any airflow obst by pfts 11/03/14    History of Present Illness  09/04/2014 1st [facility] office visit/ Raven Turner   Chief Complaint  Patient presents with  . Advice Only    Referred for chronic bronchitis. Recently moved from [place].   Every day's about the same since 2012 admitted to [facility] every month but not since arrival in [place]/   maintaining on prednisone ? Dose plus theoph/ brethine/ advair 500 and nebs "all day long"  But interestingly less symptoms nocturnally. Cough is mostly dry assoc with hoarseness  rec Stop the advair, the brethine  and the theophylline Start dulera 100 Take 2 puffs first thing in am and then another 2 puffs about 12 hours later.  Continue prilosec 20 mg Take 30- 60 min before your first and last meals of the day  GERD diet  Only use your albuterol nebulizer  If needed  If you find you don't need the nebulizer as much, then we can start reducing your prednisone by 10 mg a week   Admit date: 09/07/2014 Discharge date: 09/09/2014  Discharge Diagnoses:  Active Hospital Problems   Diagnosis Date Noted  . Pneumonia 09/07/2014  . Morbid obesity 09/08/2014  . Non-English speaking patient 09/08/2014  . Diabetes mellitus type 2, uncontrolled 09/07/2014  . Asthma exacerbation 09/07/2014  . Hypertension 09/07/2014  . HTN (hypertension)      Patient is a 44 year old Hispanic female, non-English speaking with past  history of asthma on chronic prednisone, diabetes mellitus and hypertension who was admitted to the hospitalist service on the night of 12/27 for several days of persistent productive cough, wheezing and shortness of breath. Patient was noted in the emergency room to have a mild leukocytosis as well as an early developing left upper lobe pneumonia. Patient started on nebulizers, oxygen and antibiotics.  Hospital Course:  Principal Problem:  Acute respiratory failure/Asthma exacerbation/Pneumonia: Continue IV Levaquin, add incentive spirometer, changed by mouth prednisone to IV Solu-Medrol given significant wheezing. Patient and mildly tachycardic so adding back oxygen. Added nebulizers. By 12/29, patient looked to be significantly better and off of oxygen. With ambulation, she contraction saturations at 96% although she did have some significant tachycardia. Part of this looked to be from pain from her hip which is chronic. There is also some question based off of her pulmonologist note that the way she takes her inhalers to be causing her to do more harm than good. As per their recommendation, theophylline discontinued. Patient will continue on 5 more days of by mouth Levaquin to complete a seven-day course for this pneumonia Active Problems:  HTN (hypertension): Continue home meds  Diabetes mellitus type 2, uncontrolled with peripheral neuropathy: Continue Lantus, scheduled NovoLog plus sliding scale plus Neurontin. With sodium Medrol, CBGs even more elevated and patient required increased dose of Lantus and sliding scale  Hypertension: Continue home meds  Morbid obesity: Patient meets criteria with  BMI greater than 40.      09/16/2014 f/u ov/Raven Turner re: psueudoasthma vs asthma back on theoph/brethine and pred 40  Chief Complaint  Patient presents with  . HFU    Pt states that her breathing has improved some, but not back to baseline. SHe is still coughing up yellow sputum.    using neb saba avg  twice daily, feels theoph helps with "congestion" but able to do adls ok   >d/c theophylline/breathine   09/26/2014  Follow up and Med Review (Asthma/Steroid dependent)  Interpreter present for today's visit  patient does understand limited English, husband reads and understands more English Patient reports that she's had severe asthma as a child and has been on and off steroids dose of her life She is currently on prednisone 40 mg. Patient has been in the [place] for around 6-7 months Previously treated in [place]. She says she's had several hospitalizations. Most recent hospitalization was 2 weeks ago, she is starting to feel somewhat improved. Patient was seen in office last week. Her theophylline and Brethine was stopped. Patient has brought all of her medications in today for review We reviewed all her medications organize them into a medication count with patient education Patient is on multiple medications from [place] and locally. She is taking both budesonide and Dulera. Patient has had multiple steroid related complications including cataracts, compression fx and  Diabetes. She is currently being evaluated for right arm swelling. She has MRI and 2-D echo pending. She was treated with doxycycline. She denies any chest pain, orthopnea, PND, leg swelling, abdominal pain, nausea, vomiting. Does not have any PFTs on record. rec Stop Budesonide neb  Decrease Prednisone 21m 2 tabs alternating with 1.5 tabs for 2 weeks then 1.5 tabs daily for 2 weeks and then 1 tab daily and hold at this dose.  Follow med calendar closely and bring to each visit.        11/03/2014 f/u ov/Raven Turner re: dtca asthma/ no rx today/ pred still at 30 mg  Chief Complaint  Patient presents with  . Follow-up    PFT done today. Pt states that her cough has resolved. Her breathing is some better. No new co's today.   still badly misunderstanding how to take her meds / unable to doing meaningful or accurate med rec    No obvious day to day or daytime variabilty or assoc chronic cough or cp or chest tightness, subjective wheeze overt sinus or hb symptoms. No unusual exp hx or h/o childhood pna/ asthma or knowledge of premature birth.  Sleeping ok without nocturnal  or early am exacerbation  of respiratory  c/o's or need for noct saba. Also denies any obvious fluctuation of symptoms with weather or environmental changes or other aggravating or alleviating factors except as outlined above   Current Medications, Allergies, Complete Past Medical History, Past Surgical History, Family History, and Social History were reviewed in [employer].  ROS  The following are not active complaints unless bolded sore throat, dysphagia, dental problems, itching, sneezing,  nasal congestion or excess/ purulent secretions, ear ache,   fever, chills, sweats, unintended wt loss, pleuritic or exertional cp, hemoptysis,  orthopnea pnd or leg swelling, presyncope, palpitations, heartburn, abdominal pain, anorexia, nausea, vomiting, diarrhea  or change in bowel or urinary habits, change in stools or urine, dysuria,hematuria,  rash, arthralgias, visual complaints, headache, numbness weakness or ataxia or problems with walking or coordination,  change in mood/affect or memory.  Objective:   Physical Exam   Hoarse very cushingnoid amb hispanic female nad  With prominent end exp pseudowheeze / wearing tight back brace   Wt Readings from Last 3 Encounters:  11/03/14 183 lb (83.008 kg)  09/26/14 179 lb (81.194 kg)  09/22/14 178 lb (80.74 kg)    Vital signs reviewed   HEENT: nl dentition, turbinates, and orophanx. Nl external ear canals without cough reflex   NECK :  without JVD/Nodes/TM/ nl carotid upstrokes bilaterally   LUNGS: no acc muscle use, clear to A and P bilaterally without cough on insp or exp maneuvers - no wheeze with plm   CV:  RRR  no s3 or murmur or increase in P2, no  edema   ABD:  soft and nontender with nl excursion in the supine position. No bruits or organomegaly, bowel sounds nl  MS:  warm without deformities, calf tenderness, cyanosis or clubbing  SKIN: warm and dry without lesions          CXR PA and Lateral:   09/16/2014 :  Lung volumes are low. Small focus of airspace disease in the left upper lobe seen on the prior study has resolved. Mild basilar atelectasis is noted. No pneumothorax or pleural effusion.     Assessment & Plan:

## 2014-11-03 NOTE — Assessment & Plan Note (Signed)
1. Diabetes: Will elevated sugars.  Plan to increase long acting insulin. Change from lantus 70 to toujeo 90 U once daily  Increase novolog to 40 U three times daily with meals  Sent in needles and syringes to on site pharmacy Sent in relion test strips and lancets to [facility] on [address]

## 2014-11-03 NOTE — Patient Instructions (Addendum)
Plan B = backup Only use your albuterol /proair as a rescue medication to be used if you can't catch your breath by resting or doing a relaxed purse lip breathing pattern.  - The less you use it, the better it will work when you need it. - Ok to use up to 2 puffs  every 4 hours if you must but call for immediate appointment if use goes up over your usual need - Don't leave home without it !!  (think of it like the spare tire for your car)   Plan C = neb albuterol, only use it if try B first and it doesn't work  Reduce prednisone to 20 mg daily   See calendar for specific medication instructions and bring it back for each and every office visit for every healthcare provider you see.  Without it,  you may not receive the best quality medical care that we feel you deserve.  You will note that the calendar groups together  your maintenance  medications that are timed at particular times of the day.  Think of this as your checklist for what your doctor has instructed you to do until your next evaluation to see what benefit  there is  to staying on a consistent group of medications intended to keep you well.  The other group at the bottom is entirely up to you to use as you see fit  for specific symptoms that may arise between visits that require you to treat them on an as needed basis.  Think of this as your action plan or "what if" list.   Plan B = copia de seguridad Slo use su albuterol / ProAir como medicacin de rescate que se utilizar si no se puede recuperar el aliento al descansar o hacer un patrn de respiracin del monedero de labios relajados. - Cuanto menos se use, mejor funcionar cuando lo necesite. - Ok para Risk manager un mximo de 2 inhalaciones cada 4 horas si es necesario, pero llame para una cita inmediata en caso de utilizacin sube por encima de su necesidad habitual - No salga de casa sin ella !! (Pensar en l como el neumtico de repuesto para su coche)  Plan C = nebulizador  albuterol, slo se utilizan si tratar primero y B no funciona  Reducir la prednisona a 20 mg al da  Ver calendario de instrucciones de los medicamentos especficos y traerlo de vuelta para todos y cada visita al consultorio para cada profesional de la salud que se ve. Sin ella, no puede recibir Engineer, manufacturing atencin mdica de calidad que sentimos que se merece.  Usted notar que los grupos de calendarios juntos sus medicamentos de mantenimiento que son Physiological scientist en determinados momentos del da. Piense en esto como su lista de comprobacin de lo que su mdico le indique que lo haga hasta la prxima evaluacin para ver qu beneficio hay que Public affairs consultant en un grupo consistente de medicamentos destinados a Actuary. El otro grupo en la parte inferior es totalmente de usted para Risk manager como mejor le parezca para sntomas especficos que puedan surgir entre las visitas que requieren para su tratamiento en funcin de las necesidades. Piense en esto como su plan de accin o lista de "qu pasara si".  La separacin de los [employer] del grupo inferior es fundamental para proporcionarle una atencin adecuada de cara al futuro.  Ver Tammy NP en 2 semanas con todos sus medicamentos, incluso por encima de los medicamentos de [place],  separada en dos bolsas separadas, los que consume no importa qu vs los que se detiene una vez que se sienta mejor y tomar slo cuando sea necesario cuando se siente que los necesite por lo que podemos comprobar que tiene todos sus medicamentos     Separating the top medications from the bottom group is fundamental to providing you adequate care going forward.    See Tammy NP  in 2 weeks with all your medications, even over the counter meds, separated in two separate bags, the ones you take no matter what vs the ones you stop once you feel better and take only as needed when you feel you need them so we can verify you have all your medications  If continues  with the pseudowheeze consider reclast IV and change cozar to benicar

## 2014-11-03 NOTE — Assessment & Plan Note (Addendum)
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  Continue calcium and vit D Take lowest dose of prednisone possible, taper down.  Start fosamax 70 U once weekly take with full glass of water, sit up for 30 minutes after taking this medicine helps prevent additional bone loss.

## 2014-11-03 NOTE — Progress Notes (Signed)
PFT done today. 

## 2014-11-03 NOTE — Patient Instructions (Addendum)
Mrs. [name],  Thank you for coming in to see me today.  1. Diabetes: Will elevated sugars.  Plan to increase long acting insulin. Change from lantus 70 to toujeo 90 U once daily  Increase novolog to 40 U three times daily with meals  Sent in needles and syringes to on site pharmacy Sent in relion test strips and lancets to [facility] [address]   2. L sided chest pains burning in nature: EKG is unchanged from EKG from December, 2015.  Please see cardiologist referral placed Increase lasix from 20 to 40 mg daily for diastolic heart failure  3. 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  Continue calcium and vit D Take lowest dose of prednisone possible, taper down.  Start fosamax 70 U once weekly take with full glass of water, sit up for 30 minutes after taking this medicine helps prevent additional bone loss.   4. Decreased vision with cataract surgery last year: Referral to ophthalmology placed  F/u in 3 weeks with RN for blood sugar review  F/u with me in 6 weeks  Dr. Adrian Blackwater

## 2014-11-03 NOTE — Progress Notes (Signed)
   Subjective:    Patient ID: Raven Turner, female    DOB: 05-17-1971, 44 y.o.   MRN: [id number] CC: diabetes follow up, chest pain in L arm  HPI 44 yo F with DM2   1. CHRONIC DIABETES  Disease Monitoring  Blood Sugar Ranges: fasting 120-190, post prandial 150-350 (419 one week ago, outlier)   Polyuria: no   Visual problems: no   Medication Compliance: yes  Medication Side Effects  Hypoglycemia: no   2. L sided chest pain: started on 3 days ago, improved.  Burning  pain, radiate to L arm. Occurs at rest.  Associated symptoms include none . Patient denies pain currently.   3. Blurry vision: had b/l cataract surgery with lenses placed last year and now vision is blurry even with corrective lenses.   4. Compression fractures: multiple lumbar compression fractures. Wearing a brace now. Taking calcium and vit D. Also has vit D deficiency. Having a lot of pain especially at night. Wants to know if it is safe to take the robaxin prescribed by ortho.    Soc Hx: non smoker  Review of Systems As per HPI     Objective:   Physical Exam BP 134/81 mmHg  Pulse 119  Temp(Src) 98.9 F (37.2 C)  Resp 16  Ht 4\' 10"  (1.473 m)  Wt 182 lb (82.555 kg)  BMI 38.05 kg/m2  SpO2 96%  LMP 10/13/2014  BP Readings from Last 3 Encounters:  11/03/14 134/81  11/03/14 130/80  10/06/14 126/79   Wt Readings from Last 3 Encounters:  11/03/14 182 lb (82.555 kg)  11/03/14 183 lb (83.008 kg)  09/26/14 179 lb (81.194 kg)  General appearance: alert, cooperative, no distress and morbidly obese, round face and abdomen  Lungs: clear to auscultation bilaterally Heart: regular rate and rhythm, S1, S2 normal, no murmur, click, rub or gallop Abdomen: obese, soft, NT, ND Extremities: extremities normal, atraumatic, no cyanosis or edema and edema edema in feet mostly, 2+   Lab Results  Component Value Date   HGBA1C 8.2 08/26/2014   EKG: unchanged from previous tracings, sinus tachycardia.  CBG 297  given 10 U of novolog repeat CBG 284     Assessment & Plan:

## 2014-11-04 ENCOUNTER — Telehealth: Payer: Self-pay | Admitting: Family Medicine

## 2014-11-04 LAB — VITAMIN D 25 HYDROXY (VIT D DEFICIENCY, FRACTURES): Vit D, 25-Hydroxy: 21 ng/mL — ABNORMAL LOW (ref 30–100)

## 2014-11-04 NOTE — Telephone Encounter (Signed)
Pt is having trouble filling her diabetes medication because of medicaid and the pharmacist said that if you can prescribe the following, Medicaid should approve it.   Medicaid will approve the following: ACCU Chek AVIVA PL (1) 100 accu chek aviva plus tests 100 accu chek softclix

## 2014-11-05 ENCOUNTER — Other Ambulatory Visit: Payer: Self-pay | Admitting: *Deleted

## 2014-11-05 ENCOUNTER — Telehealth: Payer: Self-pay | Admitting: Family Medicine

## 2014-11-05 MED ORDER — GLUCOSE BLOOD VI STRP: ORAL_STRIP | Status: DC

## 2014-11-05 MED ORDER — ACCU-CHEK AVIVA PLUS W/DEVICE KIT: PACK | Status: AC

## 2014-11-05 MED ORDER — ACCU-CHEK SOFTCLIX LANCET DEV MISC: Status: DC

## 2014-11-05 NOTE — Telephone Encounter (Signed)
Patient called to check on the following request:  Pt is having trouble filling her diabetes medication because of medicaid and the pharmacist said that if you can prescribe the following, Medicaid should approve it.   Medicaid will approve the following: ACCU Chek AVIVA PL (1) 100 accu chek aviva plus tests 100 accu chek softclix   Please f/u with pt.

## 2014-11-05 NOTE — Telephone Encounter (Signed)
Rx was send to [facility] Pt requesting Rx Paxil refills

## 2014-11-06 ENCOUNTER — Encounter: Payer: Self-pay | Admitting: Internal Medicine

## 2014-11-06 MED ORDER — PAROXETINE HCL 20 MG PO TABS: 20.0000 mg | ORAL_TABLET | Freq: Every day | ORAL | Status: DC

## 2014-11-06 NOTE — Assessment & Plan Note (Addendum)
09/05/2014  try dulera 100 2bid and off advair   - 09/16/2014 off theoph and brethine and added brethine - 11/03/14  PFTs p am dulera 100 on 30 mg prednisone  = wnl , including fef 25-75%, only abn is reduction in ERV  - 11/03/2014 p extensive coaching HFA effectiveness =  75% from a baseline of < 25%   I had an extended discussion with the patient via interpreter reviewing all relevant studies completed to date and  lasting 15 to 20 minutes of a 25 minute visit on the following ongoing concerns:   1) Pfts do not support any significant airflow obst and what we're mostly seeing clinically is chronic overuse of steroids/ theoph/saba that are potentially just contributing to the pseudoasthma component   2) despite several different attempts, can't communicate effectively how she should be taking her maint vs prns so intructions written again in both  Languages and fm members requested to help as well  3) must continue max gerd rx and may need off oral biphosphonates next    F/u here q 2 weeks and continue to work on weaning first off all steroids  See instructions for specific recommendations which were reviewed directly with the patient who was given a copy with highlighter outlining the key components.

## 2014-11-06 NOTE — Telephone Encounter (Signed)
-----   Message from Minerva Ends, MD sent at 11/04/2014 11:08 AM EST ----- BMP normal except for high blood sugar. Vit D improved from 13 to 21 continue supplement.

## 2014-11-06 NOTE — Telephone Encounter (Signed)
Pt aware of results  Rx was send to pharmacy

## 2014-11-06 NOTE — Addendum Note (Signed)
Addended by: Boykin Nearing on: 11/06/2014 03:45 PM   Modules accepted: Orders

## 2014-11-08 ENCOUNTER — Encounter: Payer: Self-pay | Admitting: Family Medicine

## 2014-11-11 ENCOUNTER — Telehealth: Payer: Self-pay | Admitting: Internal Medicine

## 2014-11-11 MED ORDER — MOMETASONE FURO-FORMOTEROL FUM 100-5 MCG/ACT IN AERO: INHALATION_SPRAY | RESPIRATORY_TRACT | Status: DC

## 2014-11-11 NOTE — Telephone Encounter (Signed)
Rx has been sent in. Pt's husband is aware that this has been done.

## 2014-11-14 ENCOUNTER — Ambulatory Visit: Payer: Medicaid Other | Admitting: Internal Medicine

## 2014-11-14 ENCOUNTER — Encounter: Payer: Self-pay | Admitting: Internal Medicine

## 2014-11-14 ENCOUNTER — Ambulatory Visit (INDEPENDENT_AMBULATORY_CARE_PROVIDER_SITE_OTHER): Payer: Medicaid Other | Admitting: Internal Medicine

## 2014-11-14 VITALS — BP 186/98 | HR 134 | Ht <= 58 in | Wt 183.0 lb

## 2014-11-14 DIAGNOSIS — R0602 Shortness of breath: Secondary | ICD-10-CM

## 2014-11-14 DIAGNOSIS — I10 Essential (primary) hypertension: Secondary | ICD-10-CM

## 2014-11-14 LAB — BASIC METABOLIC PANEL
BUN: 13 mg/dL (ref 6–23)
CO2: 28 mEq/L (ref 19–32)
Calcium: 10 mg/dL (ref 8.4–10.5)
Chloride: 104 mEq/L (ref 96–112)
Creatinine, Ser: 0.84 mg/dL (ref 0.40–1.20)
GFR: 78.86 mL/min (ref >60.00)
Glucose, Bld: 135 mg/dL — ABNORMAL HIGH (ref 70–99)
Potassium: 3.7 mEq/L (ref 3.5–5.1)
Sodium: 140 mEq/L (ref 135–145)

## 2014-11-14 LAB — BRAIN NATRIURETIC PEPTIDE: Pro B Natriuretic peptide (BNP): 48 pg/mL (ref 0.0–100.0)

## 2014-11-14 MED ORDER — VERAPAMIL HCL ER 360 MG PO CP24: 360.0000 mg | ORAL_CAPSULE | Freq: Every day | ORAL | Status: DC

## 2014-11-14 NOTE — Patient Instructions (Addendum)
Your physician has recommended you make the following change in your medication:  1.) increase your verapamil (VERELAN)  to 360 mg daily 2.) FOR TODAY AND TOMORROW ONLY--increase lasix to one and 1/2 tablets daily  Your physician recommends that you return for lab work in: today (BMET, BNP)  Your physician recommends that you schedule a follow-up appointment in: 4 weeks with Dr. Harrington Challenger.

## 2014-11-14 NOTE — Progress Notes (Signed)
Cardiology Office Note   Date:  11/14/2014   ID:  Raven Turner, DOB 03-22-1971, MRN [id number]  PCP:  Minerva Ends, MD  Cardiologist:   Dorris Carnes, MD   Chief Complaint  Patient presents with  . OTHER    New Patient, Diastolic Dysfunction without heart failure      History of Present Illness: Raven Turner is a 44 y.o. female with a history  Patient says she has a buring sensation L chest then L arm.  NOt all the time  3x last week  Occurred when sitting   SOB with them   Started 2 wks ago  Last 5 to 10 min  Not pleuritic   No asociation with food intake  Does nave LE edema  Cant do a lot  4 years on insulin 5 years prednisone     Current Outpatient Prescriptions  Medication Sig Dispense Refill  . albuterol (PROAIR HFA) 108 (90 BASE) MCG/ACT inhaler 2 puffs every 4 hours as needed only  if your can't catch your breath 1 Inhaler 11  . albuterol (PROVENTIL) (2.5 MG/3ML) 0.083% nebulizer solution Take 2.5 mg by nebulization every 4 (four) hours as needed for wheezing or shortness of breath.    Marland Kitchen alendronate (FOSAMAX) 70 MG tablet Take 1 tablet (70 mg total) by mouth once a week. Take with a full glass of water on an empty stomach. 12 tablet 3  . aspirin 325 MG tablet Take 1 tablet (325 mg total) by mouth daily. 30 tablet 3  . Blood Glucose Monitoring Suppl (ACCU-CHEK AVIVA PLUS) W/DEVICE KIT Used as directed 1 kit 0  . cholecalciferol (VITAMIN D) 1000 UNITS tablet Take 1 tablet (1,000 Units total) by mouth daily. 30 tablet 11  . clonazePAM (KLONOPIN) 0.5 MG tablet Take 0.5 mg by mouth 2 (two) times daily as needed (trouble sleeping and anxiety).    . cloNIDine (CATAPRES) 0.2 MG tablet Take 0.2 mg by mouth 2 (two) times daily.    Marland Kitchen dextromethorphan-guaiFENesin (MUCINEX DM) 30-600 MG per 12 hr tablet Take 1 tablet by mouth 2 (two) times daily as needed (cough, congestion).    . ferrous sulfate 325 (65 FE) MG tablet Take 1 tablet (325 mg total) by mouth daily with  breakfast. 30 tablet 3  . furosemide (LASIX) 40 MG tablet Take 1 tablet (40 mg total) by mouth daily. 90 tablet 1  . gabapentin (NEURONTIN) 400 MG capsule 2 capsules by mouth twice daily    . glucose blood (RELION GLUCOSE TEST STRIPS) test strip 1 each by Other route 4 (four) times daily. ICD code E11.65 400 each 3  . glucose blood test strip Use as instructed 100 each 12  . Insulin Glargine (TOUJEO SOLOSTAR) 300 UNIT/ML SOPN Inject 90 Units into the skin daily. 3 mL 3  . insulin lispro (HUMALOG) 100 UNIT/ML injection Inject 0.4 mLs (40 Units total) into the skin 3 (three) times daily with meals. 10 mL 3  . Insulin Pen Needle (B-D ULTRAFINE III SHORT PEN) 31G X 8 MM MISC 1 application by Does not apply route daily. 100 each 3  . Insulin Syringe-Needle U-100 (BD INSULIN SYRINGE ULTRAFINE) 31G X 15/64" 1 ML MISC 1 each by Does not apply route 3 (three) times daily. 300 each 3  . Lancet Devices (ACCU-CHEK SOFTCLIX) lancets Use as instructed 1 each 0  . losartan (COZAAR) 100 MG tablet Take 1 tablet (100 mg total) by mouth daily. 30 tablet 3  . metFORMIN (GLUCOPHAGE)  500 MG tablet Take 500 mg by mouth 2 (two) times daily with a meal.    . methocarbamol (ROBAXIN) 750 MG tablet Take 750 mg by mouth 3 (three) times daily.    . mometasone-formoterol (DULERA) 100-5 MCG/ACT AERO Take 2 puffs first thing in am and then another 2 puffs about 12 hours later. 13 g 6  . montelukast (SINGULAIR) 10 MG tablet Take 1 tablet (10 mg total) by mouth at bedtime. 90 tablet 1  . omeprazole (PRILOSEC) 20 MG capsule Take 1 capsule (20 mg total) by mouth daily. 90 capsule 1  . Oyster Shell Calcium 500 MG TABS Take 0.4 tablets (500 mg total) by mouth 2 (two) times daily after a meal. 60 tablet 11  . PARoxetine (PAXIL) 20 MG tablet Take 1 tablet (20 mg total) by mouth daily. 90 tablet 1  . predniSONE (DELTASONE) 20 MG tablet Take 2 tablets (40 mg total) by mouth daily with breakfast. Take 60 mg po bid x 3 days, then 60m po  daily x 3 days, then resume 469mdaily. 60 tablet 3  . ranitidine (ZANTAC) 300 MG tablet Take 1 tablet (300 mg total) by mouth at bedtime. 90 tablet 1  . ReliOn Ultra Thin Lancets MISC 1 each by Does not apply route 4 (four) times daily. ICD code E11.65 400 each 3  . Respiratory Therapy Supplies (FLUTTER) DEVI Use as directed 1 each 0  . traMADol (ULTRAM) 50 MG tablet 1-2 every 4 hours as needed for cough or pain 40 tablet 0  . verapamil (VERELAN PM) 240 MG 24 hr capsule Take 240 mg by mouth every morning.     No current facility-administered medications for this visit.    Allergies:   Shellfish allergy   Past Medical History  Diagnosis Date  . Asthma   . Cushing syndrome 2013   . Depression   . Anemia   . Osteopenia 11/19/2013    Dx with osteopenia in lumbar vertebra with partial compression of L1  . Diabetes 2013   . Diabetes mellitus with neuropathy   . HTN (hypertension) 2005   . COPD (chronic obstructive pulmonary disease) 2013  . Compression fracture     Past Surgical History  Procedure Laterality Date  . Cataract extraction Bilateral 2014  . Vaginal hysterectomy       Social History:  The patient  reports that she has never smoked. She has never used smokeless tobacco. She reports that she does not drink alcohol or use illicit drugs.   Family History:  The patient's family history includes Asthma in her father; Diabetes in her mother; Stroke in her father.    ROS:  Please see the history of present illness. All other systems are reviewed and  Negative to the above problem except as noted.    PHYSICAL EXAM: VS:  BP 186/98 mmHg  Pulse 134  Ht 4' 10"  (1.473 m)  Wt 183 lb (83.008 kg)  BMI 38.26 kg/m2  SpO2 97%  LMP 11/09/2014  GEN: Obese 4246o in NAD , in no acute distress HEENT: normal Neck: no JVD, carotid bruits, or masses Cardiac: RRR; no murmurs, rubs, or gallops,1+ edema  Respiratory:  clear to auscultation bilaterally, normal work of breathing GI: soft,  nontender, nondistended, + BS  No hepatomegaly  MS: no deformity Moving all extremities   Skin: warm and dry, no rash Neuro:  Strength and sensation are intact Psych: euthymic mood, full affect   EKG:  EKG is ordered today.  Sinus tachycardia     Lipid Panel No results found for: CHOL, TRIG, HDL, CHOLHDL, VLDL, LDLCALC, LDLDIRECT    Wt Readings from Last 3 Encounters:  11/14/14 183 lb (83.008 kg)  11/03/14 182 lb (82.555 kg)  11/03/14 183 lb (83.008 kg)      ASSESSMENT AND PLAN:  Patient is a 44 yo with DM and chronic steroid use.  Complains of L sided chest pain that is atypical  Not that active Echo with evid of diastolic dysfunciton  She is tachycardic on exam I would recomm increasing Verelan to 360 mg per day   Check BMET na BNP    2.  HTN  BP is high  INcrease Verelan.  Will check albs  WOuld give lasix 60 mg for 2 days  Check labs but for now plan on resuming 40 mg per day       Current medicines are reviewed at length with the patient today.  The patient does not have concerns regarding medicines.    Signed, Dorris Carnes, MD  11/14/2014 2:46 PM    [address], [place], [place]  [address] Phone: [phone]; Fax: [phone]

## 2014-11-17 ENCOUNTER — Encounter: Payer: Self-pay | Admitting: Adult Health

## 2014-11-17 ENCOUNTER — Ambulatory Visit (INDEPENDENT_AMBULATORY_CARE_PROVIDER_SITE_OTHER): Payer: Medicaid Other | Admitting: Adult Health

## 2014-11-17 VITALS — BP 114/76 | HR 92 | Temp 98.2°F | Ht <= 58 in | Wt 187.4 lb

## 2014-11-17 DIAGNOSIS — J45998 Other asthma: Secondary | ICD-10-CM

## 2014-11-17 NOTE — Assessment & Plan Note (Signed)
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 We'll continue to decrease steroids.-Slowly Patient education given on steroids Patient's medications were reviewed today and patient education was given. Computerized medication calendar was adjusted/completed   Plan  in 2 weeks on 12/01/14 decrease Prednisone 20mg  alternating 10mg  daily until seen back in offce.  Follow med calendar closely and bring to each visit.  Please contact office for sooner follow up if symptoms do not improve or worsen or seek emergency care  Follow up Dr. Melvyn Novas  In 4 weeks and As needed

## 2014-11-17 NOTE — Progress Notes (Signed)
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  Onset was childhood but continued to have "good days and bad days" until 2010 and no good days since then even p pred started and no better since arrived in [place] summer 2015 with no evidence of any airflow obst by pfts 11/03/14    History of Present Illness  09/04/2014 1st [facility] office visit/ [facility]   Chief Complaint  Patient presents with  . Advice Only    Referred for chronic bronchitis. Recently moved from [place].   Every day's about the same since 2012 admitted to [facility] every month but not since arrival in [place]/   maintaining on prednisone ? Dose plus theoph/ brethine/ advair 500 and nebs "all day long"  But interestingly less symptoms nocturnally. Cough is mostly dry assoc with hoarseness  rec Stop the advair, the brethine  and the theophylline Start dulera 100 Take 2 puffs first thing in am and then another 2 puffs about 12 hours later.  Continue prilosec 20 mg Take 30- 60 min before your first and last meals of the day  GERD diet  Only use your albuterol nebulizer  If needed  If you find you don't need the nebulizer as much, then we can start reducing your prednisone by 10 mg a week   Admit date: 09/07/2014 Discharge date: 09/09/2014  Discharge Diagnoses:  Active Hospital Problems   Diagnosis Date Noted  . Pneumonia 09/07/2014  . Morbid obesity 09/08/2014  . Non-English speaking patient 09/08/2014  . Diabetes mellitus type 2, uncontrolled 09/07/2014  . Asthma exacerbation 09/07/2014  . Hypertension 09/07/2014  . HTN (hypertension)      Patient is a 44 year old Hispanic female, non-English speaking with past  history of asthma on chronic prednisone, diabetes mellitus and hypertension who was admitted to the hospitalist service on the night of 12/27 for several days of persistent productive cough, wheezing and shortness of breath. Patient was noted in the emergency room to have a mild leukocytosis as well as an early developing left upper lobe pneumonia. Patient started on nebulizers, oxygen and antibiotics.  Hospital Course:  Principal Problem:  Acute respiratory failure/Asthma exacerbation/Pneumonia: Continue IV Levaquin, add incentive spirometer, changed by mouth prednisone to IV Solu-Medrol given significant wheezing. Patient and mildly tachycardic so adding back oxygen. Added nebulizers. By 12/29, patient looked to be significantly better and off of oxygen. With ambulation, she contraction saturations at 96% although she did have some significant tachycardia. Part of this looked to be from pain from her hip which is chronic. There is also some question based off of her pulmonologist note that the way she takes her inhalers to be causing her to do more harm than good. As per their recommendation, theophylline discontinued. Patient will continue on 5 more days of by mouth Levaquin to complete a seven-day course for this pneumonia Active Problems:  HTN (hypertension): Continue home meds  Diabetes mellitus type 2, uncontrolled with peripheral neuropathy: Continue Lantus, scheduled NovoLog plus sliding scale plus Neurontin. With sodium Medrol, CBGs even more elevated and patient required increased dose of Lantus and sliding scale  Hypertension: Continue home meds  Morbid obesity: Patient meets criteria with  BMI greater than 40.      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 HFU    Pt states that her breathing has improved some, but not back to baseline. SHe is still coughing up yellow sputum.    using neb saba avg  twice daily, feels theoph helps with "congestion" but able to do adls ok   >d/c theophylline/breathine   09/26/2014  Follow up and Med Review (Asthma/Steroid dependent)  Interpreter present for today's visit  patient does understand limited English, husband reads and understands more English Patient reports that she's had severe asthma as a child and has been on and off steroids dose of her life She is currently on prednisone 40 mg. Patient has been in the [place] for around 6-7 months Previously treated in [place]. She says she's had several hospitalizations. Most recent hospitalization was 2 weeks ago, she is starting to feel somewhat improved. Patient was seen in office last week. Her theophylline and Brethine was stopped. Patient has brought all of her medications in today for review We reviewed all her medications organize them into a medication count with patient education Patient is on multiple medications from [place] and locally. She is taking both budesonide and Dulera. Patient has had multiple steroid related complications including cataracts, compression fx and  Diabetes. She is currently being evaluated for right arm swelling. She has MRI and 2-D echo pending. She was treated with doxycycline. She denies any chest pain, orthopnea, PND, leg swelling, abdominal pain, nausea, vomiting. Does not have any PFTs on record. rec Stop Budesonide neb  Decrease Prednisone 20mg  2 tabs alternating with 1.5 tabs for 2 weeks then 1.5 tabs daily for 2 weeks and then 1 tab daily and hold at this dose.  Follow med calendar closely and bring to each visit.     11/03/2014 f/u ov/[facility]ert re: dtca asthma/ no rx today/ pred still at 30 mg  Chief Complaint  Patient presents with  . Follow-up    PFT done today. Pt states that her cough has resolved. Her breathing is some better. No new co's today.   still badly misunderstanding how to take her meds / unable to doing meaningful or accurate med rec    >>decrease pred 20     11/17/2014 Follow up : Difficult to control Asthma-steroid dependent  Interpreter is present for today's visit. Pt returns two-week follow-up We reviewed all her medications organize them into a medication calendar with patient education It appears the patient is taking her medications correctly. Last visit. Patient's prednisone was decreased to 20 mg daily She has not had a flare of cough, wheezing or shortness of breath since last visit. She denies any chest pain, orthopnea, PND, or increased leg swelling.   Current Medications, Allergies, Complete Past Medical History, Past Surgical History, Family History, and Social History were reviewed in [facility] record.  ROS  The following are not active complaints unless bolded sore throat, dysphagia, dental problems, itching, sneezing,  nasal congestion or excess/ purulent secretions, ear ache,   fever, chills, sweats, unintended wt loss, pleuritic or exertional cp, hemoptysis,  orthopnea pnd or leg swelling, presyncope, palpitations, heartburn, abdominal pain, anorexia, nausea, vomiting, diarrhea  or change in bowel or urinary habits, change in stools or urine, dysuria,hematuria,  rash, arthralgias, visual complaints, headache, numbness weakness or ataxia or problems with walking or coordination,  change in mood/affect or  memory.           Objective:   Physical Exam   Hoarse very cushingnoid amb hispanic female nad -no psuedowheezing   Vital signs reviewed   HEENT: nl dentition, turbinates, and orophanx. Nl external ear canals without cough reflex   NECK :  without JVD/Nodes/TM/ nl carotid upstrokes bilaterally   LUNGS: no acc muscle use, clear to A and P bilaterally without cough on insp or exp maneuvers -   CV:  RRR  no s3 or murmur or increase in P2, no edema   ABD:  soft and nontender with nl excursion in the supine position. No bruits or organomegaly, bowel sounds nl  MS:  warm  without deformities, calf tenderness, cyanosis or clubbing  SKIN: warm and dry without lesions          CXR PA and Lateral:   09/16/2014 :  Lung volumes are low. Small focus of airspace disease in the left upper lobe seen on the prior study has resolved. Mild basilar atelectasis is noted. No pneumothorax or pleural effusion.     Assessment & Plan:

## 2014-11-17 NOTE — Patient Instructions (Signed)
In 2 weeks on 12/01/14 decrease Prednisone 38m alternating 19mdaily until seen back in offce.  Follow med calendar closely and bring to each visit.  Please contact office for sooner follow up if symptoms do not improve or worsen or seek emergency care  Follow up Dr. WeMelvyn NovasIn 4 weeks and As needed    n 2 semanas en 12/01/14 disminucin prednisona 20 mg 10 mg al da alternando hasta que sea visto de nuevo en offce. Siga de cerca el calendario de medicina y llevar a cada visita. Por favor, pngase en contacto con la oficina para seguir hasta tarde si los sntomas no mejoran o empeoran o buscan atencin de emergencia efecte el seguimiento Dr. WeMelvyn Novasn 4 semanas y segn sea necesario

## 2014-11-20 ENCOUNTER — Telehealth: Payer: Self-pay | Admitting: Family Medicine

## 2014-11-20 MED ORDER — ALBUTEROL SULFATE HFA 108 (90 BASE) MCG/ACT IN AERS: INHALATION_SPRAY | RESPIRATORY_TRACT | Status: DC

## 2014-11-20 NOTE — Addendum Note (Signed)
Addended by: Parke Poisson E on: 11/20/2014 04:41 PM   Modules accepted: Orders

## 2014-11-20 NOTE — Telephone Encounter (Signed)
Pt is needing a refill on the following: clonazePAM (KLONOPIN) 0.5 MG tablet

## 2014-11-21 NOTE — Telephone Encounter (Signed)
Pt called to check the status of medication refill , for clonazePAM (KLONOPIN) 0.5 MG tablet. Please f/u with pt

## 2014-11-24 ENCOUNTER — Ambulatory Visit: Payer: Medicaid Other | Attending: Family Medicine | Admitting: *Deleted

## 2014-11-24 ENCOUNTER — Telehealth: Payer: Self-pay | Admitting: Family Medicine

## 2014-11-24 VITALS — BP 142/78 | HR 96 | Temp 98.5°F | Resp 22

## 2014-11-24 DIAGNOSIS — Z794 Long term (current) use of insulin: Secondary | ICD-10-CM | POA: Insufficient documentation

## 2014-11-24 DIAGNOSIS — E1165 Type 2 diabetes mellitus with hyperglycemia: Secondary | ICD-10-CM

## 2014-11-24 DIAGNOSIS — E119 Type 2 diabetes mellitus without complications: Secondary | ICD-10-CM | POA: Diagnosis present

## 2014-11-24 DIAGNOSIS — IMO0002 Reserved for concepts with insufficient information to code with codable children: Secondary | ICD-10-CM

## 2014-11-24 LAB — GLUCOSE, POCT (MANUAL RESULT ENTRY)
POC Glucose: 322 mg/dl — AB (ref 70–99)
POC Glucose: 302 mg/dl — AB (ref 70–99)

## 2014-11-24 MED ORDER — INSULIN ASPART 100 UNIT/ML ~~LOC~~ SOLN: 40.0000 [IU] | Freq: Three times a day (TID) | SUBCUTANEOUS | Status: DC

## 2014-11-24 MED ORDER — CLONAZEPAM 0.5 MG PO TABS: 0.5000 mg | ORAL_TABLET | Freq: Two times a day (BID) | ORAL | Status: DC | PRN

## 2014-11-24 MED ORDER — INSULIN GLARGINE 100 UNIT/ML SOLOSTAR PEN: 100.0000 [IU] | PEN_INJECTOR | Freq: Every day | SUBCUTANEOUS | Status: DC

## 2014-11-24 MED ORDER — INSULIN ASPART 100 UNIT/ML ~~LOC~~ SOLN
10.0000 [IU] | Freq: Once | SUBCUTANEOUS | Status: AC
  Administered 2014-11-24: 10 [IU] via SUBCUTANEOUS

## 2014-11-24 NOTE — Patient Instructions (Signed)
Plan de alimentacin DASH (DASH Eating Plan) DASH es la sigla en ingls de "Enfoques Alimentarios para Detener la Hipertensin". El plan de alimentacin DASH ha demostrado bajar la presin arterial elevada (hipertensin). Los beneficios adicionales para la salud pueden incluir la disminucin del riesgo de diabetes mellitus tipo2, enfermedades cardacas e ictus. Este plan tambin puede ayudar a Horticulturist, commercial. QU DEBO SABER ACERCA DEL PLAN DE ALIMENTACIN DASH? Para el plan de alimentacin DASH, seguir las siguientes pautas generales:  Elija los alimentos con un valor porcentual diario de sodio de menos del 5% (segn figura en la etiqueta del alimento).  Use hierbas o aderezos sin sal, en lugar de sal de mesa o sal marina.  Consulte al mdico o farmacutico antes de usar sustitutos de la sal.  Coma productos con bajo contenido de sodio, cuya etiqueta suele decir "bajo contenido de sodio" o "sin agregado de sal".  Coma alimentos frescos.  Coma ms verduras, frutas y productos lcteos con bajo contenido de [place].  Elija los cereales integrales. Busque la palabra "integral" en Equities trader de la lista de ingredientes.  Elija el pescado y el pollo o el pavo sin piel ms a menudo que las carnes rojas. Limite el consumo de pescado, carne de ave y carne a 6onzas (170g) por Training and development officer.  Limite el consumo de dulces, postres, azcares y bebidas azucaradas.  Elija las grasas saludables para el corazn.  Limite el consumo de queso a 1onza (28g) por Training and development officer.  Consuma ms comida casera y menos de restaurante, de buf y comida rpida.  Limite el consumo de alimentos fritos.  Cocine los alimentos utilizando mtodos que no sean la fritura.  Limite las verduras enlatadas. Si las consume, enjuguelas bien para disminuir el sodio.  Cuando coma en un restaurante, pida que preparen su comida con menos sal o, en lo posible, sin nada de sal. QU ALIMENTOS PUEDO COMER? Pida ayuda a un nutricionista para  conocer las necesidades calricas individuales. Cereales Pan de salvado o integral. Arroz integral. Pastas de salvado o integrales. Quinua, trigo burgol y cereales integrales. Cereales con bajo contenido de sodio. Tortillas de harina de maz o de salvado. Pan de maz integral. Galletas saladas integrales. Galletas con bajo contenido de [place]. Vegetales Verduras frescas o congeladas (crudas, al vapor, asadas o grilladas). Jugos de tomate y verduras con contenido bajo o reducido de sodio. Pasta y salsa de tomate con contenido bajo o [place]. Verduras enlatadas con bajo contenido de sodio o reducido de sodio.  Lambert Mody Lambert Mody frescas, en conserva (en su jugo natural) o frutas congeladas. Carnes y otros productos con protenas Carne de res molida (al 85% o ms [place]), carne de res de animales alimentados con pastos o carne de res sin la grasa. Pollo o pavo sin piel. Carne de pollo o de [place]. Cerdo sin la grasa. Todos los pescados y frutos de mar. Huevos. Porotos, guisantes o lentejas secos. Frutos secos y semillas sin sal. Frijoles enlatados sin sal. Lcteos Productos lcteos con bajo contenido de grasas, como [place] o al 1%, quesos reducidos en grasas o al 2%, ricota con bajo contenido de grasas o [employer], o yogur natural con bajo contenido de [place]. Quesos con contenido bajo o reducido de sodio. Grasas y Naval architect en barra que no contengan grasas trans. Mayonesa y alios para ensaladas livianos o reducidos en grasas (reducidos en sodio). [place]. Aceites de crtamo, oliva o canola. [place] natural de man o almendra. Otros Palomitas de maz y pretzels sin sal.  Los artculos mencionados arriba pueden no ser [employer] Foods Company de las bebidas o los alimentos recomendados. Comunquese con el nutricionista para conocer ms opciones. QU ALIMENTOS NO SE RECOMIENDAN? Cereales Pan blanco. Pastas blancas. Arroz blanco. Pan de maz refinado. Bagels y  croissants. Galletas saladas que contengan grasas trans. Vegetales Vegetales con crema o fritos. Verduras en [place]. Verduras enlatadas comunes. Pasta y salsa de tomate en lata comunes. Jugos comunes de tomate y de verduras. Lambert Mody Frutas secas. Fruta enlatada en almbar liviano o espeso. Jugo de frutas. Carnes y otros productos con protenas [place] de carne con Lobbyist. Costillas, alas de pollo, tocineta, salchicha, mortadela, salame, chinchulines, tocino, perros calientes, salchichas alemanas y embutidos envasados. Frutos secos y semillas con sal. Frijoles con sal en lata. Lcteos Leche entera o al 2%, crema, mezcla de [place] y crema, y queso crema. Yogur entero o endulzado. Quesos o queso azul con alto contenido de Physicist, medical. Cremas no lcteas y coberturas batidas. Quesos procesados, quesos para untar o cuajadas. Condimentos Sal de cebolla y ajo, sal condimentada, sal de mesa y sal marina. Salsas en lata y envasadas. Salsa [place]. Salsa trtara. Salsa barbacoa. Salsa teriyaki. Salsa de soja, incluso la que tiene contenido reducido de [place]. Salsa de carne. Salsa de pescado. Salsa de [place]. Salsa rosada. Rbano picante. Ketchup y mostaza. Saborizantes y tiernizantes para carne. Caldo en cubitos. Salsa picante. Salsa tabasco. Adobos. Aderezos para tacos. Salsas. Grasas y aceites [place], [place] en barra, Sylvarena de [place], [place], [place] clarificada y Wendee Copp de tocino. Aceites de coco, de palmiste o de palma. Aderezos comunes para ensalada. Otros Pickles y [place]. Palomitas de maz y pretzels con sal. Los artculos mencionados arriba pueden no ser [employer] Company de las bebidas y los alimentos que se Higher education careers adviser. Comunquese con el nutricionista para obtener ms informacin. DNDE Dolan Amen MS INFORMACIN? [place], del Pulmn y de la Sangre (National Heart, Lung, and [place]):  [email or web address] Document Released: 08/18/2011 Document Revised: 01/13/2014 [facility] Patient Information 2015 [place], [place]. This information is not intended to replace advice given to you by your health care provider. Make sure you discuss any questions you have with your health care provider. La diabetes mellitus y los alimentos (Diabetes Mellitus and Food) Es importante que controle su nivel de azcar en la sangre (glucosa). El nivel de glucosa en sangre depende en gran medida de lo que usted come. Comer alimentos saludables en las cantidades [place] a lo largo del Training and development officer, aproximadamente a la misma hora [employer], lo ayudar a Chief Technology Officer su nivel de Multimedia programmer. Tambin puede ayudarlo a retrasar o Patent attorney de la diabetes mellitus. Comer de [employer] saludable incluso puede ayudarlo a Chartered loss adjuster de presin arterial y a Science writer o Theatre manager un peso saludable.  CMO PUEDEN AFECTARME LOS ALIMENTOS? Carbohidratos Los carbohidratos afectan el nivel de glucosa en sangre ms que cualquier otro tipo de alimento. El nutricionista lo ayudar a Teacher, adult education cuntos carbohidratos puede consumir en cada comida y ensearle a contarlos. El recuento de carbohidratos es importante para mantener la glucosa en sangre en un nivel saludable, en especial si utiliza insulina o toma determinados medicamentos para la diabetes mellitus. Alcohol El alcohol puede provocar disminuciones sbitas de la glucosa en sangre (hipoglucemia), en especial si utiliza insulina o toma determinados medicamentos para la diabetes mellitus. La hipoglucemia es una afeccin que puede poner en peligro la vida. Los sntomas de la hipoglucemia (somnolencia, mareos y Data processing manager) son similares a los sntomas de  haber consumido mucho alcohol.  Si el mdico lo autoriza a beber alcohol, hgalo con moderacin y siga estas pautas:  Las mujeres no deben beber ms de un trago por da, y los  hombres no deben beber ms de dos tragos por Training and development officer. Un trago es igual a:  12 onzas (355 ml) de cerveza  5 onzas de vino (150 ml) de vino  1,5onzas (39m) de bebidas espirituosas  No beba con el estmago vaco.  Mantngase hidratado. Beba agua, gaseosas dietticas o t helado sin azcar.  Las gaseosas comunes, los jugos y otros refrescos podran contener muchos carbohidratos y se dCivil Service fast streamer QU ALIMENTOS NO SE RECOMIENDAN? Cuando haga las elecciones de alimentos, es importante que recuerde que todos los alimentos son distintos. Algunos tienen menos nutrientes que otros por porcin, aunque podran tener la misma cantidad de caloras o carbohidratos. Es difcil darle al cuerpo lo que necesita cuando consume alimentos con menos nutrientes. Estos son algunos ejemplos de alimentos que debera evitar ya que contienen muchas caloras y carbohidratos, pero pocos nutrientes:  GPhysicist, medicaltrans (la mayora de los alimentos procesados incluyen grasas trans en la etiqueta de Informacin nutricional).  Gaseosas comunes.  Jugos.  Caramelos.  Dulces, como tortas, pasteles, rosquillas y gHastings  Comidas fritas. QU ALIMENTOS PUEDO COMER? Consuma alimentos ricos en nutrientes, que nutrirn el cuerpo y lo mantendrn saludable. Los alimentos que debe comer tambin dependern de varios factores, como:  Las caloras que necesita.  Los medicamentos que toma.  Su peso.  El nivel de glucosa en sFairmont City  El nElysiande presin arterial.  El nivel de colesterol. Tambin debe consumir una variedad de aFriendswood como:  Protenas, como carne, aves, pescado, tofu, frutos secos y semillas (las protenas de aWestportmagros son mejores).  FLambert Mody  Verduras.  Productos lcteos, como lVerdi queso y yogur (descremados son mejores).  Panes, granos, pastas, cereales, arroz y frijoles.  Grasas, como aceite de oDukedom mCentral African Republicsin grasas trans, aceite de canola, aguacate y aElliott TODOS LOS QUE  PADECEN DIABETES MELLITUS TIENEN EL MDanvillePLAN DE CHydaburg Dado que todas las personas que padecen diabetes mellitus son distintas, no hay un solo plan de comidas que funcione para todos. Es muy importante que se rena con un nutricionista que lo ayudar a crear un plan de comidas adecuado para usted. Document Released: 12/06/2007 Document Revised: 09/03/2013 [facility] CenterPatient Information 2015 [place] This information is not intended to replace advice given to you by your health care provider. Make sure you discuss any questions you have with your health care provider. Diabetes y aKandace Blitzfsica (Diabetes and Exercise) Hacer actividad fsica con regularidad es muy importante. No se trata solo de pThe Mutual of Omaha Tiene muchos otros beneficios, como por ejemplo:  Mejorar el estado fsico, la flexibilidad y la resistencia.  Aumenta la densidad sea.  Ayuda a cTechnical sales engineer  Disminuye la gAir traffic controller  Aumenta la fuerza muscular.  Reduce el estrs y las tensiones.  Mejora el estado de salud general. Las personas diabticas que realizan actividad fsica tienen beneficios adicionales debido al ejercicio:  Reduce el apetito.  El organismo mejora el uso del azcar (glucosa) de la s[place]  Ayuda a disminuir o cProduct/process development scientist  Disminuye la presin arterial.  Ayuda a disminuir los lpidos en la sangre (colesterol y triglicridos).  El organismo mejora el uso de la insulina porque:  Aumenta la sensibilidad del organismo a la insulina.  Reduce las necesidades de insulina del organismo.  DApache Junctionriesgo de  enfermedad cardaca por la actividad fsica ya que  disminuye el colesterol y S[employer] triglicridos.  Aumenta los niveles de colesterol bueno (como las lipoprotenas de alta densidad [HDL]) en el organismo.  Disminuye los niveles de glucosa en la [place]. SU PLAN DE ACTIVIDAD  Elija una actividad que disfrute y establezca objetivos realistas. Su  mdico o educador en diabetes podrn ayudarlo a encontrar una actividad que lo beneficie. Haga ejercicio regularmente como se lo haya indicado el mdico. Esto incluye:  Hacer entrenamiento de [employer] a la semana, como flexiones, sentadillas, levantar peso o usar bandas de resistencia.  Practicar 118mnutos de ejercicios cardiovasculares cada semana, como caminar, correr o hacer algn deporte.  Mantenerse activo y no permanecer inactivo durante ms de 988mutos seguidos. Los perodos cortos de acSamoaambin son beneficiosos. Tres sesiones de 1077mtos a lo largo del da son tan beneficiosas como una sola sesin de 70m25mos. Estas son algunas ideas para los ejercicios:  Lleve a paseProbation officertilice las escaClinical cytogeneticist ascensor.  Baile su cancin favorita.  Haga los ejercicios de un video de ejercicios.  Haga sus ejercicios favoritos con un aGafferCOMENDACIONES PARA REALIZAR EJERCICIOS CUANDO SE TIENE DIABETES TIPO 1 O TIPO 2   Controle la glucosa en la sangre antes de comenzar. Si el nivel de glucosa en la sangre es de ms de 240 mg/dl, controle las cetonas en la orinDunning haga actividad fsica si hay cetonas.  Evite inyectarse insulina en las zonas del cuerpo que ejercitar. Por ejemplo, evite inyectarse insulina en:  Los brazos, si juega al tenis.  Las piernas, si corre.  Lleve un registro de:  Los alimentos que consume antes y despus de haceTEFL teacheros momentos esperables de picos de accin de la insulina.  Los niveles de glucosa en la sangre antes y despus de hacer ejercicios.  El tipo y cantidad de actiSamoaca que realMusicianevise los registros con su mdico. El mdico lo ayudar a desaActortas para ajustar la cantidad de alimento y las cantidades de insulina antes y despus de haceField seismologistrcicios.  Si toma insulina o agentes hipoglucemiantes por va oral, observe si hay signos y sntomas de hipoglucemia. Entre los que  se incluyen:  Mareos.  Temblores.  Sudoracin.  Escalofros.  Confusin.  Beba gran cantidad de agua mientras hace ejercicios para evitar la deshidratacin o los golpes de caloFreight forwarderrante la actividad fsica se pierde agua corporal que se debe reponer.  Comente con su mdico antes de comenzar un programa de actividad fsica para verificar que sea seguro para usted. Recuerde, cualquier actividad es mejor que ninguna. Document Released: 09/18/2007 Document Revised: 01/13/2014 ExitAdvanced Endoscopy Center PLLCient Information 2015 ExitWinfallC.[place]is information is not intended to replace advice given to you by your health care provider. Make sure you discuss any questions you have with your health care provider. Recuento bsico de carbohidratos para la diabetes mellitus (Basic Carbohydrate Counting for Diabetes Mellitus) El recuento de carbohidratos es un mtodo destinado a calcular la cantidad de carbohidratos en la dieta. El consumo de carbohidratos aumenta naturalmente el nivel de azcar (glucosa) en la sangre, por lo que es importante que sepa la cantidad que debe incluir en cada comida. El recuento de carbohidratos ayuda a mantAdvertising account executiveglucosa en la sangre dentro de los lmites normales. La cantidad permitida de carbohidratos es diferente para cada persona. Un nutricionista puede ayudarlo a calcular la cantidad adecuada para usted. Una vez que sepa la  cantidad de carbohidratos que puede consumir, podr calcular los carbohidratos de los alimentos que desea comer. Los siguientes alimentos incluyen carbohidratos:  Granos, como panes y cereales.  Frijoles secos y productos con soja.  Vegetales almidonados, como papas, guisantes y maz.  Lambert Mody y jugos de frutas.  Leche y Estate agent.  Dulces y bocadillos, como pastel, galletas, caramelos, papas fritas de bolsa, refrescos y bebidas frutales con azcar. RECUENTO DE CARBOHIDRATOS Micron Technology de calcular los carbohidratos de los alimentos. Puede usar  cualquiera de los dos mtodos o [place] combinacin de [place]. Leer la etiqueta de informacin nutricional de los alimentos envasados La informacin nutricional es una etiqueta incluida en casi todas las bebidas y los alimentos envasados de los [place]. Indica el tamao de la porcin de ese alimento o bebida e informacin sobre los nutrientes de cada porcin, incluso los gramos (g) de carbohidratos por porcin.  Decida la cantidad de porciones que comer o tomar de este alimento o bebida. Multiplique la cantidad de porciones por el nmero de gramos de carbohidratos indicados en la etiqueta para esa porcin. El total ser la cantidad de carbohidratos que consumir al comer ese alimento o tomar esa bebida. Conocer las porciones estndar de los alimentos Cuando coma alimentos no envasados o que no incluyan la informacin nutricional en la etiqueta, deber medir las porciones para poder calcular la cantidad de carbohidratos. Una porcin de la mayora de los alimentos ricos en carbohidratos contiene alrededor de 15g de carbohidratos. La siguiente [employer] tamaos de porcin de los alimentos ricos en carbohidratos que contienen alrededor de 15g de carbohidratos por porcin:   1rebanada de pan (1oz) o 1tortilla de seis pulgadas.  panecillo de hamburguesa o bollito tipo ingls.  4a 6galletas.   de taza de cereal sin azcar y seco.   taza de cereal caliente.   de taza de arroz o pastas.  taza de pur de papas o de una papa grande al horno.  1taza de frutas frescas o una fruta pequea.  taza de frutas o jugo de frutas enlatados o congelados.  [place].   de taza de yogur descremado sin ningn agregado o de yogur endulzado con edulcorante artificial.  taza de vegetales almidonados, como guisantes, maz o papas, o de frijoles secos cocidos. Decida la cantidad de porciones Gaffer. Multiplique la cantidad de porciones por 15 (los gramos de  carbohidratos en esa porcin). Por ejemplo, si come 2tazas de fresas, habr comido 2porciones y 30g de carbohidratos (2porciones x 15g = 30g). [place] y guisos, en las que se mezcla ms de un alimento, deber [employer] carbohidratos de cada alimento incluido. EJEMPLO DE RECUENTO DE CARBOHIDRATOS Ejemplo de cena  3 onzas de pechugas de pollo.   de taza de arroz integral.   taza de [place].  1 taza de fresas con crema batida sin azcar. Clculo de [place] 1: Identifique los alimentos que contienen carbohidratos:   Arroz.  Maz.  Leche.  Hughie Closs. [place] 2: Calcule el nmero de porciones que consumir de cada uno:   2 porciones de Occupational psychologist.  1 porcin de maz.  [place].  1 porcin de fresas. [place] 3: Multiplique cada una de esas porciones por 15g:   2 porciones de arroz x 15 g = 30 g.  1 porcin de maz x 15 g = 15 g.  1 porcin de leche x 15 g = 15 g.  1  porcin de fresas x 15 g = 15 g. [place] 4: Sume todas las cantidades para Armed forces logistics/support/administrative officer total de gramos de carbohidratos consumidos: 30 g + 15 g + 15 g + 15 g = 75 g. Document Released: 11/21/2011 Document Revised: 01/13/2014 [facility] Patient Information 2015 [place]. This information is not intended to replace advice given to you by your health care provider. Make sure you discuss any questions you have with your health care provider.

## 2014-11-24 NOTE — Telephone Encounter (Signed)
-----   Message from Velora Heckler, RN sent at 11/24/2014 12:13 PM EDT ----- Regarding: Refill Request Patient is requesting refill on klonopin 0.5 mg bid prn. Ran out 3 days ago

## 2014-11-24 NOTE — Telephone Encounter (Signed)
Please call patient. Klonopin refilled.  Printed and ready for pick up

## 2014-11-24 NOTE — Telephone Encounter (Signed)
Pt notified Rx in our front office

## 2014-11-24 NOTE — Progress Notes (Signed)
Spoke with patient via IT sales professional, Restaurant manager, fast food Patient presents for BP, CBG and record review Med list reviewed; patient reports taking all meds as directed except holding fosamax and titrating prednisone down per Pulmonologist. Currently taking prednisone 30 mg daily Due to insurance pt is taking lantus and novolog vs toujeo and humalog Patient's AM fasting blood sugars ranging 104-214 Patient's before lunch blood sugars ranging 162-300 Patient's before dinner blood sugars ranging 192-365 Patient's bedtime blood sugars ranging 182-379  Patient's AM fasting blood sugar at 0830 was 144.  Pt took metformin 1000 mg and 40 units novolog insulin at 0915 (15 minutes after starting meal of cereal and 2 slices brown bread) In-office CBG at 1100 = 322  BP 142/78 P 96 R 22 T  98.5 oral SpO2 97%  Per PCP: 10 units novolog insulin now per protocol Increase lantus to 100 units daily  Patient advised to call for med refills at least 7 days before running out so as not to go without.  Patient aware that she is to f/u with PCP 6 weeks from last visit. Due 12/15/2014  Patient given literature on DASH Eating Plan Diabetes and Food, Diabetes and Exercise, Basic Carb Counting  Referral to Diabetic Nutrition and Education placed  CBG 302 40 minutes after insulin admin. Patient discharged to home in stable condition.

## 2014-11-27 LAB — HM DIABETES EYE EXAM

## 2014-11-28 ENCOUNTER — Telehealth: Payer: Self-pay | Admitting: Emergency Medicine

## 2014-11-28 ENCOUNTER — Telehealth: Payer: Self-pay | Admitting: Family Medicine

## 2014-11-28 NOTE — Telephone Encounter (Signed)
Pt called stating that she has an appt at [facility] on Monday 12/01/14 . Facility is needing authorization, please f/u at [phone]

## 2014-11-28 NOTE — Telephone Encounter (Signed)
EXAM DID NOT REVEAL DIABETIC NEUROPATHY

## 2014-11-28 NOTE — Telephone Encounter (Signed)
[id number] 

## 2014-12-11 ENCOUNTER — Other Ambulatory Visit: Payer: Self-pay | Admitting: Family Medicine

## 2014-12-11 NOTE — Addendum Note (Signed)
Addended by: Inge Rise on: 12/11/2014 02:53 PM   Modules accepted: Orders, Medications

## 2014-12-15 ENCOUNTER — Encounter: Payer: Self-pay | Admitting: Family Medicine

## 2014-12-15 ENCOUNTER — Encounter: Payer: Self-pay | Admitting: Internal Medicine

## 2014-12-15 ENCOUNTER — Ambulatory Visit: Payer: Medicaid Other | Attending: Family Medicine | Admitting: Family Medicine

## 2014-12-15 ENCOUNTER — Ambulatory Visit (INDEPENDENT_AMBULATORY_CARE_PROVIDER_SITE_OTHER): Payer: Medicaid Other | Admitting: Internal Medicine

## 2014-12-15 VITALS — BP 150/88 | HR 102 | Ht <= 58 in | Wt 187.0 lb

## 2014-12-15 VITALS — BP 132/82 | HR 107 | Temp 98.4°F | Resp 18 | Ht <= 58 in | Wt 187.0 lb

## 2014-12-15 VITALS — BP 130/80 | HR 110 | Ht <= 58 in | Wt 188.0 lb

## 2014-12-15 DIAGNOSIS — L03111 Cellulitis of right axilla: Secondary | ICD-10-CM | POA: Diagnosis not present

## 2014-12-15 DIAGNOSIS — R0600 Dyspnea, unspecified: Secondary | ICD-10-CM

## 2014-12-15 DIAGNOSIS — IMO0002 Reserved for concepts with insufficient information to code with codable children: Secondary | ICD-10-CM

## 2014-12-15 DIAGNOSIS — L84 Corns and callosities: Secondary | ICD-10-CM | POA: Diagnosis not present

## 2014-12-15 DIAGNOSIS — L03115 Cellulitis of right lower limb: Secondary | ICD-10-CM | POA: Insufficient documentation

## 2014-12-15 DIAGNOSIS — I10 Essential (primary) hypertension: Secondary | ICD-10-CM | POA: Diagnosis not present

## 2014-12-15 DIAGNOSIS — L0390 Cellulitis, unspecified: Secondary | ICD-10-CM

## 2014-12-15 DIAGNOSIS — E1165 Type 2 diabetes mellitus with hyperglycemia: Secondary | ICD-10-CM | POA: Diagnosis not present

## 2014-12-15 DIAGNOSIS — M8088XS Other osteoporosis with current pathological fracture, vertebra(e), sequela: Secondary | ICD-10-CM | POA: Insufficient documentation

## 2014-12-15 DIAGNOSIS — B9562 Methicillin resistant Staphylococcus aureus infection as the cause of diseases classified elsewhere: Secondary | ICD-10-CM

## 2014-12-15 DIAGNOSIS — R609 Edema, unspecified: Secondary | ICD-10-CM | POA: Insufficient documentation

## 2014-12-15 DIAGNOSIS — Z22322 Carrier or suspected carrier of Methicillin resistant Staphylococcus aureus: Secondary | ICD-10-CM | POA: Diagnosis not present

## 2014-12-15 DIAGNOSIS — J45998 Other asthma: Secondary | ICD-10-CM

## 2014-12-15 DIAGNOSIS — Z8614 Personal history of Methicillin resistant Staphylococcus aureus infection: Secondary | ICD-10-CM | POA: Insufficient documentation

## 2014-12-15 DIAGNOSIS — M818 Other osteoporosis without current pathological fracture: Secondary | ICD-10-CM

## 2014-12-15 DIAGNOSIS — T380X5S Adverse effect of glucocorticoids and synthetic analogues, sequela: Secondary | ICD-10-CM | POA: Diagnosis not present

## 2014-12-15 DIAGNOSIS — Z6841 Body Mass Index (BMI) 40.0 and over, adult: Secondary | ICD-10-CM | POA: Diagnosis not present

## 2014-12-15 DIAGNOSIS — Z794 Long term (current) use of insulin: Secondary | ICD-10-CM | POA: Insufficient documentation

## 2014-12-15 DIAGNOSIS — T380X5A Adverse effect of glucocorticoids and synthetic analogues, initial encounter: Secondary | ICD-10-CM

## 2014-12-15 HISTORY — DX: Carrier or suspected carrier of methicillin resistant Staphylococcus aureus: Z22.322

## 2014-12-15 LAB — BASIC METABOLIC PANEL
BUN: 14 mg/dL (ref 6–23)
CO2: 25 mEq/L (ref 19–32)
Calcium: 9.5 mg/dL (ref 8.4–10.5)
Chloride: 101 mEq/L (ref 96–112)
Creatinine, Ser: 0.74 mg/dL (ref 0.40–1.20)
GFR: 91.25 mL/min (ref >60.00)
Glucose, Bld: 250 mg/dL — ABNORMAL HIGH (ref 70–99)
Potassium: 3.7 mEq/L (ref 3.5–5.1)
Sodium: 135 mEq/L (ref 135–145)

## 2014-12-15 LAB — LIPID PANEL
Cholesterol: 163 mg/dL (ref 0–200)
HDL: 45.9 mg/dL (ref >39.00)
LDL Cholesterol: 79 mg/dL (ref 0–99)
NonHDL: 117.1
Total CHOL/HDL Ratio: 4
Triglycerides: 192 mg/dL — ABNORMAL HIGH (ref 0.0–149.0)
VLDL: 38.4 mg/dL (ref 0.0–40.0)

## 2014-12-15 LAB — GLUCOSE, POCT (MANUAL RESULT ENTRY)
POC Glucose: 276 mg/dl — AB (ref 70–99)
POC Glucose: 288 mg/dl — AB (ref 70–99)

## 2014-12-15 LAB — BRAIN NATRIURETIC PEPTIDE: Pro B Natriuretic peptide (BNP): 115 pg/mL — ABNORMAL HIGH (ref 0.0–100.0)

## 2014-12-15 LAB — POCT GLYCOSYLATED HEMOGLOBIN (HGB A1C): Hemoglobin A1C: 9.4

## 2014-12-15 MED ORDER — INSULIN ASPART 100 UNIT/ML ~~LOC~~ SOLN
10.0000 [IU] | Freq: Once | SUBCUTANEOUS | Status: AC
  Administered 2014-12-15: 10 [IU] via SUBCUTANEOUS

## 2014-12-15 MED ORDER — CLONIDINE HCL 0.2 MG PO TABS: 0.2000 mg | ORAL_TABLET | Freq: Three times a day (TID) | ORAL | Status: DC

## 2014-12-15 MED ORDER — CLINDAMYCIN HCL 300 MG PO CAPS: 300.0000 mg | ORAL_CAPSULE | Freq: Three times a day (TID) | ORAL | Status: DC

## 2014-12-15 MED ORDER — METFORMIN HCL 1000 MG PO TABS
1000.0000 mg | ORAL_TABLET | Freq: Two times a day (BID) | ORAL | Status: DC
Start: 2014-12-15 — End: 2015-02-25

## 2014-12-15 MED ORDER — MUPIROCIN 2 % EX OINT: 1.0000 "application " | TOPICAL_OINTMENT | Freq: Two times a day (BID) | CUTANEOUS | Status: DC

## 2014-12-15 NOTE — Patient Instructions (Signed)
Your physician recommends that you return for lab work in:  TODAY (BMET, BNP, LIPIDS)  Your physician has recommended you make the following change in your medication:  1.) INCREASE CLONIDINE TO 0.2 MG THREE (3) TIMES DAILY  Your physician recommends that you schedule a follow-up appointment in: June 2016 [place].

## 2014-12-15 NOTE — Progress Notes (Signed)
   Subjective:    Patient ID: Raven Turner, female    DOB: 06/21/1971, 44 y.o.   MRN: [id number] CC: f/u DM2  HPI  1. 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 Reclast recommended. Patient amenable to once yearly IV infusion. Patient already as multiple vertebral compression fractures.   3. Pustules: in March of 2015 she had ID of an abscess done, grew out MRSA. She frequently gets painful erythematous macules on skin associated with pustules. She has two active areas now and third appearing to be coming. Areas are under R arm near axilla, and R upper thigh x 2.   4. Compression fracture: wearing brace. Unable to see ortho due to lack of documentation from clinic.   Soc Hx: non smoker  Review of Systems Dry cracked heels R>L Tingling and numbness in feet     Objective:   Physical Exam BP 132/82 mmHg  Pulse 107  Temp(Src) 98.4 F (36.9 C) (Oral)  Resp 18  Ht 4\' 9"  (1.448 m)  Wt 187 lb (84.823 kg)  BMI 40.46 kg/m2  SpO2 97% General appearance: alert, cooperative, no distress and morbidly obese Heart: regular rate and rhythm, S1, S2 normal, no murmur, click, rub or gallop Extremities: edema 1 + in feet  Skin:  R upper arm-erythematous macule R upper thigh-erythematous macule with associated central pustule R upper thigh-erythematous papule  After obtaining verbal consent and cleaning in usual fashion the pustule on patient thigh was lanced, bacitracin ointment applied. The area was dressed.    Lab Results  Component Value Date   HGBA1C 8.2 08/26/2014   Lab Results  Component Value Date   HGBA1C 9.40 12/15/2014    CBG 276  10  U novolog give  Repeat CBG 288       Assessment & Plan:

## 2014-12-15 NOTE — Progress Notes (Signed)
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  Onset was childhood but continued to have "good days and bad days" until 2010 and no good days since then even p pred started and no better since arrived in [place] summer 2015 with no evidence of any airflow obst by pfts 11/03/14    History of Present Illness  09/04/2014 1st [facility] office visit/ Wert   Chief Complaint  Patient presents with  . Advice Only    Referred for chronic bronchitis. Recently moved from [place].   Every day's about the same since 2012 admitted to [facility] every month but not since arrival in us/   maintaining on prednisone ? Dose plus theoph/ brethine/ advair 500 and nebs "all day long"  But interestingly less symptoms nocturnally. Cough is mostly dry assoc with hoarseness  rec Stop the advair, the brethine  and the theophylline Start dulera 100 Take 2 puffs first thing in am and then another 2 puffs about 12 hours later.  Continue prilosec 20 mg Take 30- 60 min before your first and last meals of the day  GERD diet  Only use your albuterol nebulizer  If needed  If you find you don't need the nebulizer as much, then we can start reducing your prednisone by 10 mg a week   Admit date: 09/07/2014 Discharge date: 09/09/2014  Discharge Diagnoses:  Active Hospital Problems   Diagnosis Date Noted  . Pneumonia 09/07/2014  . Morbid obesity 09/08/2014  . Non-English speaking patient 09/08/2014  . Diabetes mellitus type 2, uncontrolled 09/07/2014  . Asthma exacerbation 09/07/2014  . Hypertension 09/07/2014  . HTN (hypertension)      Patient is a 44 year old Hispanic female, non-English speaking with past  history of asthma on chronic prednisone, diabetes mellitus and hypertension who was admitted to the hospitalist service on the night of 12/27 for several days of persistent productive cough, wheezing and shortness of breath. Patient was noted in the emergency room to have a mild leukocytosis as well as an early developing left upper lobe pneumonia. Patient started on nebulizers, oxygen and antibiotics.  Hospital Course:  Principal Problem:  Acute respiratory failure/Asthma exacerbation/Pneumonia: Continue IV Levaquin, add incentive spirometer, changed by mouth prednisone to IV Solu-Medrol given significant wheezing. Patient and mildly tachycardic so adding back oxygen. Added nebulizers. By 12/29, patient looked to be significantly better and off of oxygen. With ambulation, she contraction saturations at 96% although she did have some significant tachycardia. Part of this looked to be from pain from her hip which is chronic. There is also some question based off of her pulmonologist note that the way she takes her inhalers to be causing her to do more harm than good. As per their recommendation, theophylline discontinued. Patient will continue on 5 more days of by mouth Levaquin to complete a seven-day course for this pneumonia Active Problems:  HTN (hypertension): Continue home meds  Diabetes mellitus type 2, uncontrolled with peripheral neuropathy: Continue Lantus, scheduled NovoLog plus sliding scale plus Neurontin. With sodium Medrol, CBGs even more elevated and patient required increased dose of Lantus and sliding scale  Hypertension: Continue home meds  Morbid obesity: Patient meets criteria with  BMI greater than 40.      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 HFU    Pt states that her breathing has improved some, but not back to baseline. SHe is still coughing up yellow sputum.    using neb saba avg  twice daily, feels theoph helps with "congestion" but able to do adls ok   >d/c theophylline/breathine   09/26/2014  Follow up and Med Review (Asthma/Steroid dependent)  Interpreter present for today's visit  patient does understand limited English, husband reads and understands more English Patient reports that she's had severe asthma as a child and has been on and off steroids dose of her life She is currently on prednisone 40 mg. Patient has been in the [place] for around 6-7 months Previously treated in [place]. She says she's had several hospitalizations. Most recent hospitalization was 2 weeks ago, she is starting to feel somewhat improved. Patient was seen in office last week. Her theophylline and Brethine was stopped. Patient has brought all of her medications in today for review We reviewed all her medications organize them into a medication count with patient education Patient is on multiple medications from [place] and locally. She is taking both budesonide and Dulera. Patient has had multiple steroid related complications including cataracts, compression fx and  Diabetes. She is currently being evaluated for right arm swelling. She has MRI and 2-D echo pending. She was treated with doxycycline. She denies any chest pain, orthopnea, PND, leg swelling, abdominal pain, nausea, vomiting. Does not have any PFTs on record. rec Stop Budesonide neb  Decrease Prednisone 63m 2 tabs alternating with 1.5 tabs for 2 weeks then 1.5 tabs daily for 2 weeks and then 1 tab daily and hold at this dose.  Follow med calendar closely and bring to each visit.     11/03/2014 f/u ov/Wert re: dtca asthma/ no rx today/ pred still at 30 mg  Chief Complaint  Patient presents with  . Follow-up    PFT done today. Pt states that her cough has resolved. Her breathing is some better. No new co's today.   still badly misunderstanding how to take her meds / unable to doing meaningful or accurate med rec    >>decrease pred 20     11/17/2014 Follow up : Difficult to control Asthma-steroid dependent /NP ov Interpreter is present for today's visit.  We reviewed all her medications organize them into a medication calendar with patient education It appears the patient is taking her medications correctly. Last visit. Patient's prednisone was decreased to 20 mg daily She has not had a flare of cough, wheezing or shortness of breath since last visit. rec In 2 weeks on 12/01/14 decrease Prednisone 234malternating 1072maily until seen back in offce.  Follow med calendar closely and bring to each visit.  Please contact office for sooner follow up if symptoms do not improve or worsen or seek emergency care     12/15/2014 f/u ov/Wert re: chronic asthma/ pseudoasthma / brought med calendar in priEaglendition  Chief Complaint  Patient presents with  . Follow-up    Pt states still feeling SOB especially with movement. Denies fever, cough, or chest tightness   on prednisone 20 mg  Plus neb 3 tid including am of ov due to variable sob day >>noct   No obvious patterns in  day to day or daytime variabilty or assoc chronic  cough or cp or chest tightness, subjective wheeze overt sinus or hb symptoms. No unusual exp hx or h/o childhood pna/ asthma or knowledge of premature birth.  Sleeping ok without nocturnal  or early am exacerbation  of respiratory  c/o's or need for noct saba. Also denies any obvious fluctuation of symptoms with weather or environmental changes or other aggravating or alleviating factors except as outlined above   Current Medications, Allergies, Complete Past Medical History, Past Surgical History, Family History, and Social History were reviewed in [employer] record.  ROS  The following are not active complaints unless bolded sore throat, dysphagia, dental problems, itching, sneezing,  nasal congestion or excess/ purulent secretions, ear ache,   fever, chills,  sweats, unintended wt loss, pleuritic or exertional cp, hemoptysis,  orthopnea pnd or leg swelling, presyncope, palpitations, heartburn, abdominal pain, anorexia, nausea, vomiting, diarrhea  or change in bowel or urinary habits, change in stools or urine, dysuria,hematuria,  rash, arthralgias, visual complaints, headache, numbness weakness or ataxia or problems with walking or coordination,  change in mood/affect or memory.                 Objective:   Physical Exam   Moderately Hoarse very cushingnoid amb hispanic female nad / still mod pseudowheeze    Wt Readings from Last 3 Encounters:  12/15/14 188 lb (85.276 kg)  12/15/14 187 lb (84.823 kg)  11/17/14 187 lb 6.4 oz (85.004 kg)    Vital signs reviewed       HEENT: nl dentition, turbinates, and orophanx. Nl external ear canals without cough reflex   NECK :  without JVD/Nodes/TM/ nl carotid upstrokes bilaterally   LUNGS: no acc muscle use, clear to A and P bilaterally without cough on insp or exp maneuvers -   CV:  RRR  no s3 or murmur or increase in P2, no edema   ABD:  soft and nontender with nl excursion in the supine position. No bruits or organomegaly, bowel sounds nl  MS:  warm without deformities, calf tenderness, cyanosis or clubbing  SKIN: warm and dry without lesions          CXR PA and Lateral:   09/16/2014 :  Lung volumes are low. Small focus of airspace disease in the left upper lobe seen on the prior study has resolved. Mild basilar atelectasis is noted. No pneumothorax or pleural effusion.     Assessment & Plan:

## 2014-12-15 NOTE — Patient Instructions (Addendum)
Mrs. Raven Turner,  Thank you for coming in today.  1. Diabetes with elevated blood sugars on prednisone Decrease prednisone to 20 mg daily per Dr. Gustavus Bryant instructions.  Please increase metformin to 1000 mg twice daily  Continue insulin dose the same as we anticipate that your sugars will improve as you take less and less prednisone.    2. 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 Dry cracked heel on R foot: Use cerave, eucerin apply to heels, wear socks most of the time to keep heels moisturized.  Podiatry referral   4. Ortho referral- I will send a note to the referral coordinator to reschedule appt.   5. Prednisone induced osteoperosis- reclast is the once yearly IV infusion, I will work on setting this up.   F/u in 4 weeks with nurse for CBG review F/u with me in 3 months for diabetes   Dr. Adrian Blackwater

## 2014-12-15 NOTE — Progress Notes (Signed)
F/U DM   

## 2014-12-15 NOTE — Patient Instructions (Addendum)
Plan A = automatic = dulera 100 Take 2 puffs first thing in am and then another 2 puffs about 12 hours later.   Work on inhaler technique:  relax and gently blow all the way out then take a nice smooth deep breath back in, triggering the inhaler at same time you start breathing in.  Hold for up to 5 seconds if you can.  Rinse and gargle with water when done     Plan B = backup Only use your albuterol /proair as a rescue medication to be used if you can't catch your breath by resting or doing a relaxed purse lip breathing pattern.  - The less you use it, the better it will work when you need it. - Ok to use up to 2 puffs  every 4 hours if you must but call for immediate appointment if use goes up over your usual need - Don't leave home without it !!  (think of it like the spare tire for your car)   Plan C = neb albuterol, only use it if try B first and it doesn't work  Continue  prednisone to 20 mg daily   See calendar for specific medication instructions and bring it back for each and every office visit for every healthcare provider you see.  Without it,  you may not receive the best quality medical care that we feel you deserve.   Plan A = automtico = Dulera 100 Take 2 inhalaciones a primera hora de la maana y luego otros 2 soplos sobre 12 horas despus.  El trabajo en la tcnica de inhalacin: relajarse y soplar suavemente todo el camino a continuacin, tomar una buena respiracin profunda sin problemas de nuevo, Conservation officer, historic buildings al mismo tiempo que empieza a Armed forces training and education officer la posicin durante unos 5 segundos si se puede.Durene Romans con agua y hacer grgaras cuando se hace   Plan B = copia de seguridad Slo use su albuterol / ProAir como medicacin de rescate que se utilizar si no se puede recuperar el aliento al descansar o hacer un patrn de respiracin del monedero de labios relajados. - Cuanto menos se use, mejor funcionar cuando lo necesite. - Ok para Risk manager un  mximo de 2 inhalaciones cada 4 horas si es necesario, pero llame para una cita inmediata en caso de utilizacin sube por encima de su necesidad habitual - No salga de casa sin ella !! (Pensar en l como el neumtico de repuesto para su coche)  Plan C = nebulizador albuterol, slo se utilizan si tratar primero y B no funciona  Continuar prednisona a 20 mg al da  Ver calendario de instrucciones de los medicamentos especficos y traerlo de vuelta para todos y cada visita al consultorio para cada profesional de la salud que se ve. Sin ella, no puede recibir Engineer, manufacturing atencin mdica de calidad que sentimos que se merece.  Usted notar que los grupos de calendarios juntos sus medicamentos de mantenimiento que son Physiological scientist en determinados momentos del da. Piense en esto como su lista de comprobacin de lo que su mdico le indique que lo haga hasta la prxima evaluacin para ver qu beneficio hay que Public affairs consultant en un grupo consistente de medicamentos destinados a Actuary. El otro grupo en la parte inferior es totalmente de usted para Risk manager como mejor le parezca para sntomas especficos que puedan surgir entre las visitas que requieren para su tratamiento en funcin de las necesidades. Piense en esto como su plan de accin  o lista de "qu pasara si".   Voy a Horticulturist, commercial Dr. Adrian Blackwater re: Reclast   Por favor, programar una visita de seguimiento de oficinas en 4 semanas, antes si es necesario y llevar inhaladores con usted    You will note that the calendar groups together  your maintenance  medications that are timed at particular times of the day.  Think of this as your checklist for what your doctor has instructed you to do until your next evaluation to see what benefit  there is  to staying on a consistent group of medications intended to keep you well.  The other group at the bottom is entirely up to you to use as you see fit  for specific symptoms that may arise between visits that  require you to treat them on an as needed basis.  Think of this as your action plan or "what if" list.    I will write Dr Adrian Blackwater re:  RECLAST    Please schedule a follow up office visit in 4 weeks, sooner if needed and bring inhalers with you

## 2014-12-16 ENCOUNTER — Other Ambulatory Visit: Payer: Self-pay | Admitting: Family Medicine

## 2014-12-16 DIAGNOSIS — L84 Corns and callosities: Secondary | ICD-10-CM

## 2014-12-16 HISTORY — DX: Corns and callosities: L84

## 2014-12-16 LAB — MICROALBUMIN / CREATININE URINE RATIO
Creatinine, Urine: 51.3 mg/dL
Microalb Creat Ratio: 19.5 mg/g (ref 0.0–30.0)
Microalb, Ur: 1 mg/dL (ref <2.0)

## 2014-12-16 NOTE — Assessment & Plan Note (Signed)
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

## 2014-12-16 NOTE — Assessment & Plan Note (Signed)
A: Prednisone induced osteoperosis, in setting of GERD and ongoing prednisone therapy precluding use of oral bisphosphonate P:  reclast once yearly IV infusion

## 2014-12-16 NOTE — Assessment & Plan Note (Addendum)
  A: Diabetes with elevated blood sugars on prednisone, still uncontrolled P: Decrease prednisone to 20 mg daily per Dr. Gustavus Bryant instructions.  Please increase metformin to 1000 mg twice daily  Continue insulin dose the same as we anticipate that your sugars will improve as you take less and less prednisone.

## 2014-12-16 NOTE — Assessment & Plan Note (Signed)
Dry cracked heel on R foot: Use cerave, eucerin apply to heels, wear socks most of the time to keep heels moisturized.  Podiatry referral

## 2014-12-17 ENCOUNTER — Other Ambulatory Visit: Payer: Self-pay | Admitting: Family Medicine

## 2014-12-17 ENCOUNTER — Encounter: Payer: Self-pay | Admitting: Internal Medicine

## 2014-12-17 DIAGNOSIS — R0600 Dyspnea, unspecified: Secondary | ICD-10-CM | POA: Insufficient documentation

## 2014-12-17 NOTE — Assessment & Plan Note (Signed)
12/16/2014   Walked RA x one lap @ 185 stopped x 2 due to sob with sats upper 90's slow pace   Strongly suspect obesity/ LPR/ VCD and decondtioning here but asthmatic component well controlled

## 2014-12-17 NOTE — Assessment & Plan Note (Addendum)
09/05/2014  try dulera 100 2bid and off advair   - 09/16/2014 off theoph and brethine and added brethine  - 11/03/14  PFTs p am dulera 100 on 30 mg prednisone  = wnl , including fef 25-75%, only abn is reduction in ERV -med calendar 11/17/2014   The proper method of use, as well as anticipated side effects, of a metered-dose inhaler are discussed and demonstrated to the patient. Improved effectiveness after extensive coaching during this visit to a level of approximately  75%   Overall improving but still way over using saba for now apparent reason, esp the neb.  I had an extended discussion with the patient reviewing all relevant studies completed to date and  lasting 15 to 20 minutes of a 25 minute visit on the following ongoing concerns:   1)  Each maintenance medication was reviewed in detail including most importantly the difference between maintenance and as needed and under what circumstances the prns are to be used. This was done in the context of a medication calendar review which provided the patient with a user-friendly unambiguous mechanism for medication administration and reconciliation and provides an action plan for all active problems. It is critical that this be shown to every doctor  for modification during the office visit if necessary so the patient can use it as a working document.      2) I seriously doubt she needs 20 mg per day but has been on up to 60 mg daily  x > 5 years so ok for now to continue this "trough dose"   3) due to steroids has issues with osteo but would strongly rec IV yearly  reclast for biphosphonate based on concern for LPR/GERD destabilizing her upper airway

## 2014-12-19 ENCOUNTER — Other Ambulatory Visit: Payer: Self-pay | Admitting: *Deleted

## 2014-12-19 ENCOUNTER — Other Ambulatory Visit: Payer: Self-pay | Admitting: Family Medicine

## 2014-12-19 ENCOUNTER — Telehealth: Payer: Self-pay | Admitting: Family Medicine

## 2014-12-19 MED ORDER — GABAPENTIN 400 MG PO CAPS: 400.0000 mg | ORAL_CAPSULE | Freq: Two times a day (BID) | ORAL | Status: DC

## 2014-12-19 NOTE — Telephone Encounter (Signed)
Pt called requesting medication refill for losartan (COZAAR) 100 MG tablet. Pt states she is completely out of medication. Please f/u with pt

## 2014-12-23 ENCOUNTER — Other Ambulatory Visit: Payer: Self-pay | Admitting: *Deleted

## 2014-12-23 MED ORDER — LOSARTAN POTASSIUM 100 MG PO TABS: 100.0000 mg | ORAL_TABLET | Freq: Every day | ORAL | Status: DC

## 2014-12-23 NOTE — Telephone Encounter (Signed)
Rx refilled send to [facility]

## 2014-12-24 ENCOUNTER — Encounter: Payer: Self-pay | Admitting: *Deleted

## 2014-12-25 ENCOUNTER — Telehealth: Payer: Self-pay | Admitting: *Deleted

## 2014-12-25 NOTE — Telephone Encounter (Signed)
Pt aware of results  (results given in Spanish)

## 2014-12-25 NOTE — Telephone Encounter (Signed)
-----   Message from Boykin Nearing, MD sent at 12/16/2014  9:07 AM EDT ----- Normal urine microalbumin, continue current regimen.

## 2014-12-27 NOTE — Progress Notes (Signed)
Cardiology Office Note   Date:  12/27/2014   ID:  Raven Turner, DOB [birth year], MRN [id number]  PCP:  Minerva Ends, MD  Cardiologist:   Dorris Carnes, MD   Chief Complaint  Patient presents with  . Hypertension      History of Present Illness: Raven Turner is a 44 y.o. female with a history ofchest pressure and HTN  I saw her in March On last visit I recomm increasing Verelan.    Patient says in PM her BP is upWill have pain in L rib  Feels "bubbles" in head         Current Outpatient Prescriptions  Medication Sig Dispense Refill  . albuterol (PROAIR HFA) 108 (90 BASE) MCG/ACT inhaler 2 puffs every 4 hours as needed only  if your can't catch your breath 1 Inhaler 5  . albuterol (PROVENTIL) (2.5 MG/3ML) 0.083% nebulizer solution Take 2.5 mg by nebulization every 4 (four) hours as needed for wheezing or shortness of breath.    Marland Kitchen aspirin 325 MG tablet Take 1 tablet (325 mg total) by mouth daily. 30 tablet 3  . Blood Glucose Monitoring Suppl (ACCU-CHEK AVIVA PLUS) W/DEVICE KIT Used as directed 1 kit 0  . cholecalciferol (VITAMIN D) 1000 UNITS tablet Take 1 tablet (1,000 Units total) by mouth daily. 30 tablet 11  . clonazePAM (KLONOPIN) 0.5 MG tablet Take 1 tablet (0.5 mg total) by mouth 2 (two) times daily as needed (trouble sleeping and anxiety). 30 tablet 2  . cloNIDine (CATAPRES) 0.2 MG tablet Take 1 tablet (0.2 mg total) by mouth 3 (three) times daily. 90 tablet 11  . dextromethorphan-guaiFENesin (MUCINEX DM) 30-600 MG per 12 hr tablet Take 1 tablet by mouth 2 (two) times daily as needed (cough, congestion).    . ferrous sulfate 325 (65 FE) MG tablet Take 1 tablet (325 mg total) by mouth daily with breakfast. 30 tablet 3  . FOLIC ACID PO Take 1 tablet by mouth every morning.    . furosemide (LASIX) 40 MG tablet Take 40 mg by mouth every morning.    Marland Kitchen glucose blood (RELION GLUCOSE TEST STRIPS) test strip 1 each by Other route 4 (four) times daily. ICD code  E11.65 400 each 3  . glucose blood test strip Use as instructed 100 each 12  . insulin aspart (NOVOLOG) 100 UNIT/ML injection Inject 40 Units into the skin 3 (three) times daily before meals. 3 vial PRN  . Insulin Glargine (LANTUS SOLOSTAR) 100 UNIT/ML Solostar Pen Inject 100 Units into the skin daily at 10 pm. 5 pen PRN  . Insulin Pen Needle (B-D ULTRAFINE III SHORT PEN) 31G X 8 MM MISC 1 application by Does not apply route daily. 100 each 3  . Insulin Syringe-Needle U-100 (BD INSULIN SYRINGE ULTRAFINE) 31G X 15/64" 1 ML MISC 1 each by Does not apply route 3 (three) times daily. 300 each 3  . Lancet Devices (ACCU-CHEK SOFTCLIX) lancets Use as instructed 1 each 0  . methocarbamol (ROBAXIN) 750 MG tablet Take 750 mg by mouth 3 (three) times daily.    . mometasone-formoterol (DULERA) 100-5 MCG/ACT AERO Take 2 puffs first thing in am and then another 2 puffs about 12 hours later. 13 g 6  . montelukast (SINGULAIR) 10 MG tablet Take 10 mg by mouth at bedtime.    Marland Kitchen omeprazole (PRILOSEC) 20 MG capsule Take 1 capsule (20 mg total) by mouth daily. 90 capsule 1  . Oyster Shell Calcium 500 MG TABS Take 0.4  tablets (500 mg total) by mouth 2 (two) times daily after a meal. 60 tablet 11  . PARoxetine (PAXIL) 20 MG tablet Take 20 mg by mouth daily.    . ranitidine (ZANTAC) 300 MG tablet Take 1 tablet (300 mg total) by mouth at bedtime. 90 tablet 1  . ReliOn Ultra Thin Lancets MISC 1 each by Does not apply route 4 (four) times daily. ICD code E11.65 400 each 3  . Respiratory Therapy Supplies (FLUTTER) DEVI Use as directed 1 each 0  . traMADol (ULTRAM) 50 MG tablet 1-2 every 4 hours as needed for cough or pain 40 tablet 0  . verapamil (VERELAN PM) 360 MG 24 hr capsule Take 360 mg by mouth at bedtime.   11  . clindamycin (CLEOCIN) 300 MG capsule Take 1 capsule (300 mg total) by mouth 3 (three) times daily. 21 capsule 0  . gabapentin (NEURONTIN) 400 MG capsule Take 1 capsule (400 mg total) by mouth 2 (two) times  daily. 2 capsules by mouth twice daily 120 capsule 3  . losartan (COZAAR) 100 MG tablet Take 1 tablet (100 mg total) by mouth daily. 30 tablet 3  . metFORMIN (GLUCOPHAGE) 1000 MG tablet Take 1 tablet (1,000 mg total) by mouth 2 (two) times daily with a meal. 60 tablet 11  . mupirocin ointment (BACTROBAN) 2 % Place 1 application into the nose 2 (two) times daily. For 5 days 15 g 0  . predniSONE (DELTASONE) 20 MG tablet Take 20 mg by mouth daily with breakfast.      No current facility-administered medications for this visit.    Allergies:   Shellfish allergy   Past Medical History  Diagnosis Date  . Asthma   . Cushing syndrome 2013   . Depression   . Anemia   . Osteopenia 11/19/2013    Dx with osteopenia in lumbar vertebra with partial compression of L1  . Diabetes 2013   . Diabetes mellitus with neuropathy   . HTN (hypertension) 2005   . COPD (chronic obstructive pulmonary disease) 2013  . Compression fracture     Past Surgical History  Procedure Laterality Date  . Cataract extraction Bilateral 2014  . Vaginal hysterectomy       Social History:  The patient  reports that she has never smoked. She has never used smokeless tobacco. She reports that she does not drink alcohol or use illicit drugs.   Family History:  The patient's family history includes Asthma in her father; Diabetes in her mother; Stroke in her father.    ROS:  Please see the history of present illness. All other systems are reviewed and  Negative to the above problem except as noted.    PHYSICAL EXAM: VS:  BP 150/88 mmHg  Pulse 102  Ht 4' 10"  (1.473 m)  Wt 187 lb (84.823 kg)  BMI 39.09 kg/m2  SpO2 97%  GEN: Well nourished, well developed, in no acute distress HEENT: normal Neck: no JVD, carotid bruits, or masses Cardiac: RRR; no murmurs, rubs, or gallops,no edema  Respiratory:  clear to auscultation bilaterally, normal work of breathing GI: soft, nontender, nondistended, + BS  No hepatomegaly  MS:  no deformity Moving all extremities   Skin: warm and dry, no rash Neuro:  Strength and sensation are intact Psych: euthymic mood, full affect   EKG:  EKG is not ordered today.   Lipid Panel    Component Value Date/Time   CHOL 163 12/15/2014 1051   TRIG 192.0* 12/15/2014 1051  HDL 45.90 12/15/2014 1051   CHOLHDL 4 12/15/2014 1051   VLDL 38.4 12/15/2014 1051   LDLCALC 79 12/15/2014 1051      Wt Readings from Last 3 Encounters:  12/15/14 187 lb (84.823 kg)  12/15/14 188 lb (85.276 kg)  12/15/14 187 lb (84.823 kg)      ASSESSMENT AND PLAN: 1.  HTN  BP is not optimally controlled  Will increase clonidine to 0.2 mg 3x per day  Check labs    Need to clarify lasix dosing  ? 40 alt with 60 or 40/40/60      Current medicines are reviewed at length with the patient today.  The patient does not have concerns regarding medicines.  The following changes have been made:   Labs/ tests ordered today include: Orders Placed This Encounter  Procedures  . Basic Metabolic Panel (BMET)  . B Nat Peptide  . Lipid Profile     Disposition:   FU with  in   Signed, Dorris Carnes, MD  12/27/2014 2:42 PM    [facility] [place], [place], [place]  [address] Phone: [phone]; Fax: [phone]

## 2014-12-29 ENCOUNTER — Encounter: Payer: Self-pay | Admitting: Dietician

## 2014-12-29 ENCOUNTER — Encounter: Payer: Medicaid Other | Attending: Family Medicine | Admitting: Dietician

## 2014-12-29 VITALS — Ht <= 58 in | Wt 183.0 lb

## 2014-12-29 DIAGNOSIS — Z713 Dietary counseling and surveillance: Secondary | ICD-10-CM | POA: Insufficient documentation

## 2014-12-29 DIAGNOSIS — Z794 Long term (current) use of insulin: Secondary | ICD-10-CM | POA: Insufficient documentation

## 2014-12-29 DIAGNOSIS — E1165 Type 2 diabetes mellitus with hyperglycemia: Secondary | ICD-10-CM | POA: Insufficient documentation

## 2014-12-29 DIAGNOSIS — IMO0002 Reserved for concepts with insufficient information to code with codable children: Secondary | ICD-10-CM

## 2014-12-29 NOTE — Patient Instructions (Signed)
Plan:  Aim for 3 Carb Choices per meal (45 grams) +/- 1 either way  Aim for 0-1 Carbs per snack if hungry  Include protein in moderation with your meals and snacks Consider reading food labels for Total Carbohydrate and Fat Grams of foods  Plan, La meta es tres carbohidratos por comida (45 gramos)   Tomar 0-1 porcion de carbohidrato por  bocadillo ( snack ), si tiene hambre. Incluya proteina con moderacion con sus comidas y botanas. Considere leer las etiquetas de la comida para ver el total de carbohidratos y gramos de grasa de las comidas.

## 2014-12-29 NOTE — Progress Notes (Signed)
  Medical Nutrition Therapy:  Appt start time: 0815 end time:  0915.   Assessment:  Primary concerns today: Patient is here today with her husband and the interpretor, Lesle Chris.  She would like to learn how to better control her diabetes..   She states that she has been diagnosed with type 2 diabetes about 4 years.  Her HgbA1C is 9.4% 12/15/14. Checks CGB 4 times per day, Before breakfast:  110-130, before lunch:  >200, before dinner:  >200, before bed:  >250-300.   As high as 350-400.  Patient states that she will be seeing Dr. Buddy Duty today.    Multiple verebral compression fractures noted.  States that she is on prednisone for asthma which has cauased osteoporosis.  Hx includes MRSA.    Patient lives with her husband.  Patient does the cooking and shopping.  They are from [place]  Preferred Learning Style:   No preference indicated   Learning Readiness:   Ready  MEDICATIONS: see list which includes Novolog, lantus, and glucophage   DIETARY INTAKE: No red meat  24-hr recall:  B (9 AM): cereal (honey nut cheerios)- 1 1/2-2 cups and whole milk 2 slices [employer] bread with cheese, ham and mayo, and banana and black coffee Snk ( AM): none  L (1:30 PM): rice and beans, chicken, Snk ( PM): none D (6-7 PM): rice and beans Snk ( PM): if sugar is low- regular yogurt Beverages: water, black coffee  Usual physical activity: patient is wearing a full back brace.  She does not exercise.  Will walk if back and asthma OK.   Estimated energy needs: 1400 calories 158 g carbohydrates 105 g protein 39 g fat  Progress Towards Goal(s):  In progress.   Nutritional Diagnosis:  NB-1.1 Food and nutrition-related knowledge deficit As related to balance of carbohydrates, protein, and fat.  As evidenced by diet hx.    Intervention:  Nutrition counseling and diabetes education initiated. Discussed Carb Counting by food group as method of portion control, reading food labels, and benefits of  increased activity. Also discussed basic physiology of Diabetes, target BG ranges pre and post meals, and A1c.  Plan:  Aim for 3 Carb Choices per meal (45 grams) +/- 1 either way  Aim for 0-1 Carbs per snack if hungry  Include protein in moderation with your meals and snacks Consider reading food labels for Total Carbohydrate and Fat Grams of foods  Plan, La meta es tres carbohidratos por comida (45 gramos)   Tomar 0-1 porcion de carbohidrato por  bocadillo ( snack ), si tiene hambre. Incluya proteina con moderacion con sus comidas y botanas. Considere leer las etiquetas de la comida para ver el total de carbohidratos y gramos de grasa de las comidas.   Teaching Method Utilized:  Visual Auditory Hands on  Handouts given during visit include:  Spanish my plate  Spanish meal plan card  Label reading  Spanish Living with Diabetes  Barriers to learning/adherence to lifestyle change: language barrier  Demonstrated degree of understanding via:  Teach Back   Monitoring/Evaluation:  Dietary intake, exercise, label reading, and body weight in 1 month(s).

## 2014-12-31 ENCOUNTER — Telehealth: Payer: Self-pay | Admitting: *Deleted

## 2014-12-31 DIAGNOSIS — S32010S Wedge compression fracture of first lumbar vertebra, sequela: Secondary | ICD-10-CM

## 2014-12-31 MED ORDER — FERROUS SULFATE 325 (65 FE) MG PO TABS: 325.0000 mg | ORAL_TABLET | Freq: Every day | ORAL | Status: DC

## 2014-12-31 MED ORDER — ASPIRIN 325 MG PO TABS: 325.0000 mg | ORAL_TABLET | Freq: Every day | ORAL | Status: DC

## 2014-12-31 NOTE — Telephone Encounter (Signed)
Patient's husband called in response to letter sent for lab work. Reviewed labs with him. Verbalizes understanding.

## 2014-12-31 NOTE — Telephone Encounter (Signed)
Requesting Orthopedic referral with Dr Phylliss [facility] refills Rx Ferrous Sulfate and ASA Send to [facility]

## 2015-01-05 ENCOUNTER — Encounter: Payer: Self-pay | Admitting: Podiatry

## 2015-01-05 ENCOUNTER — Ambulatory Visit (INDEPENDENT_AMBULATORY_CARE_PROVIDER_SITE_OTHER): Payer: Medicaid Other | Admitting: Podiatry

## 2015-01-05 VITALS — BP 150/90 | HR 72 | Resp 12

## 2015-01-05 DIAGNOSIS — M79676 Pain in unspecified toe(s): Secondary | ICD-10-CM

## 2015-01-05 DIAGNOSIS — B351 Tinea unguium: Secondary | ICD-10-CM | POA: Diagnosis not present

## 2015-01-05 NOTE — Patient Instructions (Signed)
For the dry cracked heels at night apply cocoa butter skin lotion, cover with kitchen Saran wrap and wear a sock. Apply nightly until the dry cracks and Wear cotton socks when wearing shoes  Diabetes and Foot Care Diabetes may cause you to have problems because of poor blood supply (circulation) to your feet and legs. This may cause the skin on your feet to become thinner, break easier, and heal more slowly. Your skin may become dry, and the skin may peel and crack. You may also have nerve damage in your legs and feet causing decreased feeling in them. You may not notice minor injuries to your feet that could lead to infections or more serious problems. Taking care of your feet is one of the most important things you can do for yourself.  HOME CARE INSTRUCTIONS  Wear shoes at all times, even in the house. Do not go barefoot. Bare feet are easily injured.  Check your feet daily for blisters, cuts, and redness. If you cannot see the bottom of your feet, use a mirror or ask someone for help.  Wash your feet with warm water (do not use hot water) and mild soap. Then pat your feet and the areas between your toes until they are completely dry. Do not soak your feet as this can dry your skin.  Apply a moisturizing lotion or petroleum jelly (that does not contain alcohol and is unscented) to the skin on your feet and to dry, brittle toenails. Do not apply lotion between your toes.  Trim your toenails straight across. Do not dig under them or around the cuticle. File the edges of your nails with an emery board or nail file.  Do not cut corns or calluses or try to remove them with medicine.  Wear clean socks or stockings every day. Make sure they are not too tight. Do not wear knee-high stockings since they may decrease blood flow to your legs.  Wear shoes that fit properly and have enough cushioning. To break in new shoes, wear them for just a few hours a day. This prevents you from injuring your feet.  Always look in your shoes before you put them on to be sure there are no objects inside.  Do not cross your legs. This may decrease the blood flow to your feet.  If you find a minor scrape, cut, or break in the skin on your feet, keep it and the skin around it clean and dry. These areas may be cleansed with mild soap and water. Do not cleanse the area with peroxide, alcohol, or iodine.  When you remove an adhesive bandage, be sure not to damage the skin around it.  If you have a wound, look at it several times a day to make sure it is healing.  Do not use heating pads or hot water bottles. They may burn your skin. If you have lost feeling in your feet or legs, you may not know it is happening until it is too late.  Make sure your health care provider performs a complete foot exam at least annually or more often if you have foot problems. Report any cuts, sores, or bruises to your health care provider immediately. SEEK MEDICAL CARE IF:   You have an injury that is not healing.  You have cuts or breaks in the skin.  You have an ingrown nail.  You notice redness on your legs or feet.  You feel burning or tingling in your legs or feet.  You have pain or cramps in your legs and feet.  Your legs or feet are numb.  Your feet always feel cold. SEEK IMMEDIATE MEDICAL CARE IF:   There is increasing redness, swelling, or pain in or around a wound.  There is a red line that goes up your leg.  Pus is coming from a wound.  You develop a fever or as directed by your health care provider.  You notice a bad smell coming from an ulcer or wound. Document Released: 08/26/2000 Document Revised: 05/01/2013 Document Reviewed: 02/05/2013 [facility] Patient Information 2015 [place], [place]. This information is not intended to replace advice given to you by your health care provider. Make sure you discuss any questions you have with your health care provider.

## 2015-01-05 NOTE — Progress Notes (Signed)
   Subjective:    Patient ID: Raven Turner, female    DOB: 1970-10-01, 44 y.o.   MRN: [id number]  HPI  N-SORE, THICK L-RT FOOT 2ND, LT FOOT 1ST TOENAIL D- 1 YEAR O-SLOWLY C-WORSE A-PRESSURE T-HUSBAND TRIM THE TOENAILS  ALSO, B/L  SIDE HEELS HAVE CRACK SKIN.  Patient denies any history of foot ulceration or claudication  Review of Systems  Constitutional: Positive for fatigue and unexpected weight change.  Respiratory: Positive for shortness of breath.   Skin: Positive for color change.  Allergic/Immunologic: Positive for food allergies.       Objective:   Physical Exam  Patient appears orientated 3 with her husband present to treatment room patient has minimal English skills and her husband helps her respond to questioning She is wearing a brace for a fractured vertebrae   Vascular: DP and PT pulses 2/4 bilaterally Capillary reflex immediate bilaterally  Neurological: Ankle reflexes reactive bilaterally Vibratory sensation intact bilaterally Sensation to 10 g mono feel wire intact 3/5 right and 2/5 left  Dermatological: Second right toenail and left hallux are discolored with texture and color changes and tender to direct palpation Fissuring posterior heels bilaterally  Musculoskeletal: No deformities noted bilaterally There is no restriction ankle, subtalar, midtarsal joints bilaterally       Assessment & Plan:   Assessment: Satisfactory vascular status Diminished protective sensation laterally Symptomatic onychomycoses 2 Fissured heels bilaterally  Plan: I reviewed the results of the examination with patient husband today Debridement of toenails 2 without any bleeding Commended the use of cocoa butter to the heels at night and cover with kitchen Saran wrap until the cracking improves  Reappoint 3 months

## 2015-01-06 ENCOUNTER — Other Ambulatory Visit: Payer: Self-pay | Admitting: Family Medicine

## 2015-01-06 MED ORDER — GABAPENTIN 400 MG PO CAPS: 800.0000 mg | ORAL_CAPSULE | Freq: Two times a day (BID) | ORAL | Status: DC

## 2015-01-06 NOTE — Addendum Note (Signed)
Addended by: Boykin Nearing on: 01/06/2015 03:08 PM   Modules accepted: Orders

## 2015-01-06 NOTE — Telephone Encounter (Signed)
Referral placed to Dr. Dumonski. 

## 2015-01-07 ENCOUNTER — Encounter: Payer: Self-pay | Admitting: Family Medicine

## 2015-01-07 ENCOUNTER — Ambulatory Visit: Payer: Medicaid Other | Attending: Family Medicine | Admitting: Family Medicine

## 2015-01-07 ENCOUNTER — Telehealth: Payer: Self-pay | Admitting: Family Medicine

## 2015-01-07 VITALS — BP 152/84 | HR 127 | Temp 98.2°F | Resp 18 | Ht <= 58 in | Wt 186.0 lb

## 2015-01-07 DIAGNOSIS — I10 Essential (primary) hypertension: Secondary | ICD-10-CM

## 2015-01-07 DIAGNOSIS — L03115 Cellulitis of right lower limb: Secondary | ICD-10-CM

## 2015-01-07 DIAGNOSIS — I471 Supraventricular tachycardia, unspecified: Secondary | ICD-10-CM

## 2015-01-07 MED ORDER — SULFAMETHOXAZOLE-TRIMETHOPRIM 800-160 MG PO TABS: 1.0000 | ORAL_TABLET | Freq: Two times a day (BID) | ORAL | Status: DC

## 2015-01-07 MED ORDER — METOPROLOL SUCCINATE ER 25 MG PO TB24: 25.0000 mg | ORAL_TABLET | Freq: Every day | ORAL | Status: DC

## 2015-01-07 NOTE — Progress Notes (Signed)
Patient presents with right LE that is reddened, painful, swollen, and "feels tight".  Area turned red approximately 01/04/15 Middle toes on right foot very painful. Pain 7/10 with movement.  HR elevated-127.  Discussed with Sharon Seller, NP to determine if EKG needed. She declines and indicates she will prescribe beta blocker for patient. Spanish interpreter used for translation.

## 2015-01-07 NOTE — Telephone Encounter (Signed)
Patient called stating that her R leg is red and warm. She would like to see her PCP or get some advice on what to do next. Please f/u

## 2015-01-07 NOTE — Patient Instructions (Signed)
Take new medication metoprolol once a day for blood pressure and fast heart rate Take antibiotic 2 times a day for 10 days. Elevate legs for 20 minutes several times a day Warm compresses to swollen, reddened area.

## 2015-01-07 NOTE — Progress Notes (Signed)
Patient ID: Raven Turner, female   DOB: 12/17/1970, 44 y.o.   MRN: [id number]   CC:  Redness of leg  HPI:  Patient presents today with redness and swelling of right anterior lower leg since Saturday. She has a history of MRSA colonization previously but not in this area. While here today, we find her heart rate regular at a rate [id number]. I see the heart rate has been elevated over 100 on several visits here in the past.  Her BP is still not under adequate control on 3 different medications.   EXAM:  Alert, oriented, appropriate, in no acute distress. Lungs are clear. HS regular and rapid. The anterior lower legs is swollen and has hightened erythema. There is an approximately 4x4 area on the mid shin and surrounding tissue.  ASSESSMENT:  1. Possible early cellulitis 2. Tachycardia and hypertension  PLAN:  1.Septra DS, #20, one po bid for 10 days    Elevate legs 20 minutes several times a day    Warm compresses 2-3 times a day 2.metoprolol 25 mg daily  Follow-up wih Dr. FAdrian Blackwateror me in one week for recheck of both conditions.

## 2015-01-07 NOTE — Telephone Encounter (Signed)
Pt has appointment with Triage nurse today

## 2015-01-08 ENCOUNTER — Telehealth: Payer: Self-pay | Admitting: Internal Medicine

## 2015-01-08 NOTE — Telephone Encounter (Signed)
The previous referral was with the same doctor .  Sent a new referral to  [facility] ph# [phone] Dr Katherina Right .They will contact the patient to schedule an appointment.

## 2015-01-08 NOTE — Telephone Encounter (Signed)
New message      Talk to a nurse something about his wife's metoprolol and taking other medications.  Ok to call tomorrow before 11am.

## 2015-01-09 ENCOUNTER — Telehealth: Payer: Self-pay | Admitting: Family Medicine

## 2015-01-09 NOTE — Telephone Encounter (Signed)
Patient called to speak to RMA about a medication, plase f/u

## 2015-01-09 NOTE — Telephone Encounter (Signed)
Jose st the patient's BP was high, so Dr. Adrian Blackwater started her on Toprol.  Instructed him to tell the patient to take as directed and to contact our office if she has any problems. Jose agrees with treatment plan.

## 2015-01-12 ENCOUNTER — Ambulatory Visit (INDEPENDENT_AMBULATORY_CARE_PROVIDER_SITE_OTHER): Payer: Medicaid Other | Admitting: Internal Medicine

## 2015-01-12 ENCOUNTER — Encounter: Payer: Self-pay | Admitting: Internal Medicine

## 2015-01-12 VITALS — BP 116/80 | HR 113 | Ht <= 58 in | Wt 186.0 lb

## 2015-01-12 DIAGNOSIS — J45901 Unspecified asthma with (acute) exacerbation: Secondary | ICD-10-CM | POA: Diagnosis not present

## 2015-01-12 DIAGNOSIS — J45998 Other asthma: Secondary | ICD-10-CM | POA: Diagnosis not present

## 2015-01-12 MED ORDER — FLUTTER DEVI: Status: DC

## 2015-01-12 NOTE — Assessment & Plan Note (Addendum)
09/05/2014  try dulera 100 2bid and off advair   - 09/16/2014 off theoph and brethine    - 11/03/14  PFTs p am dulera 100 on 30 mg prednisone  = wnl , including fef 25-75%, only abn is reduction in ERV -med calendar 11/17/2014  - 12/2014 changed to physiologic steroids    The proper method of use, as well as anticipated side effects, of a metered-dose inhaler are discussed and demonstrated to the patient. Improved effectiveness after extensive coaching during this visit to a level of approximately  75%    I had an extended discussion with the patient reviewing all relevant studies completed to date and  lasting 15 to 20 minutes of a 25 minute visit on the following ongoing concerns:   1) Overall much better except for overuse of saba > tachycardia so rather than add BB need to reduce Beta agonists  2) need to use flutter/ tramadol for upper airway cough/ no the saba  3) agree with maintaining off physiologic steroids - encouraged her that this would help her lose wt  4) Each maintenance medication was reviewed via interpreter including most importantly the difference between maintenance and as needed and under what circumstances the prns are to be used. This was done in the context of a medication calendar review which provided the patient with a user-friendly unambiguous mechanism for medication administration and reconciliation and provides an action plan for all active problems. It is critical that this be shown to every doctor  for modification during the office visit if necessary so the patient can use it as a working document.      5) f/u q 4 weeks to maintain med reconciliation.

## 2015-01-12 NOTE — Progress Notes (Signed)
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  Onset was childhood but continued to have "good days and bad days" until 2010 and no good days since then even p pred started and no better since arrived in [place] summer 2015 with no evidence of any airflow obst by pfts 11/03/14    History of Present Illness  09/04/2014 1st [facility] office visit/ [facility]   Chief Complaint  Patient presents with  . Advice Only    Referred for chronic bronchitis. Recently moved from [place].   Every day's about the same since 2012 admitted to [facility] every month but not since arrival in us/   maintaining on prednisone ? Dose plus theoph/ brethine/ advair 500 and nebs "all day long"  But interestingly less symptoms nocturnally. Cough is mostly dry assoc with hoarseness  rec Stop the advair, the brethine  and the theophylline Start dulera 100 Take 2 puffs first thing in am and then another 2 puffs about 12 hours later.  Continue prilosec 20 mg Take 30- 60 min before your first and last meals of the day  GERD diet  Only use your albuterol nebulizer  If needed  If you find you don't need the nebulizer as much, then we can start reducing your prednisone by 10 mg a week   Admit date: 09/07/2014 Discharge date: 09/09/2014  Discharge Diagnoses:  Active Hospital Problems   Diagnosis Date Noted  . Pneumonia 09/07/2014  . Morbid obesity 09/08/2014  . Non-English speaking patient 09/08/2014  . Diabetes mellitus type 2, uncontrolled 09/07/2014  . Asthma exacerbation 09/07/2014  . Hypertension 09/07/2014  . HTN (hypertension)      Patient is a 44 year old Hispanic female, non-English speaking with past  history of asthma on chronic prednisone, diabetes mellitus and hypertension who was admitted to the hospitalist service on the night of 12/27 for several days of persistent productive cough, wheezing and shortness of breath. Patient was noted in the emergency room to have a mild leukocytosis as well as an early developing left upper lobe pneumonia. Patient started on nebulizers, oxygen and antibiotics.  Hospital Course:  Principal Problem:  Acute respiratory failure/Asthma exacerbation/Pneumonia: Continue IV Levaquin, add incentive spirometer, changed by mouth prednisone to IV Solu-Medrol given significant wheezing. Patient and mildly tachycardic so adding back oxygen. Added nebulizers. By 12/29, patient looked to be significantly better and off of oxygen. With ambulation, she contraction saturations at 96% although she did have some significant tachycardia. Part of this looked to be from pain from her hip which is chronic. There is also some question based off of her pulmonologist note that the way she takes her inhalers to be causing her to do more harm than good. As per their recommendation, theophylline discontinued. Patient will continue on 5 more days of by mouth Levaquin to complete a seven-day course for this pneumonia Active Problems:  HTN (hypertension): Continue home meds  Diabetes mellitus type 2, uncontrolled with peripheral neuropathy: Continue Lantus, scheduled NovoLog plus sliding scale plus Neurontin. With sodium Medrol, CBGs even more elevated and patient required increased dose of Lantus and sliding scale  Hypertension: Continue home meds  Morbid obesity: Patient meets criteria with  BMI greater than 40.      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 HFU    Pt states that her breathing has improved some, but not back to baseline. SHe is still coughing up yellow sputum.    using neb saba avg  twice daily, feels theoph helps with "congestion" but able to do adls ok   >d/c theophylline/breathine   09/26/2014  Follow up and Med Review (Asthma/Steroid dependent)  Interpreter present for today's visit  patient does understand limited English, husband reads and understands more English Patient reports that she's had severe asthma as a child and has been on and off steroids dose of her life She is currently on prednisone 40 mg. Patient has been in the [place] for around 6-7 months Previously treated in [place]. She says she's had several hospitalizations. Most recent hospitalization was 2 weeks ago, she is starting to feel somewhat improved. Patient was seen in office last week. Her theophylline and Brethine was stopped. Patient has brought all of her medications in today for review We reviewed all her medications organize them into a medication count with patient education Patient is on multiple medications from [place] and locally. She is taking both budesonide and Dulera. Patient has had multiple steroid related complications including cataracts, compression fx and  Diabetes. She is currently being evaluated for right arm swelling. She has MRI and 2-D echo pending. She was treated with doxycycline. She denies any chest pain, orthopnea, PND, leg swelling, abdominal pain, nausea, vomiting. Does not have any PFTs on record. rec Stop Budesonide neb  Decrease Prednisone 57m 2 tabs alternating with 1.5 tabs for 2 weeks then 1.5 tabs daily for 2 weeks and then 1 tab daily and hold at this dose.  Follow med calendar closely and bring to each visit.     11/03/2014 f/u ov/Wert re: dtca asthma/ no rx today/ pred still at 30 mg  Chief Complaint  Patient presents with  . Follow-up    PFT done today. Pt states that her cough has resolved. Her breathing is some better. No new co's today.   still badly misunderstanding how to take her meds / unable to doing meaningful or accurate med rec    >>decrease pred 20     11/17/2014 Follow up : Difficult to control Asthma-steroid dependent /NP ov Interpreter is present for today's visit.  We reviewed all her medications organize them into a medication calendar with patient education It appears the patient is taking her medications correctly. Last visit. Patient's prednisone was decreased to 20 mg daily She has not had a flare of cough, wheezing or shortness of breath since last visit. rec In 2 weeks on 12/01/14 decrease Prednisone 281malternating 1052maily until seen back in offce.  Follow med calendar closely and bring to each visit.  Please contact office for sooner follow up if symptoms do not improve or worsen or seek emergency care     12/15/2014 f/u ov/[facility]ert re: chronic asthma/ pseudoasthma / [place]  Chief Complaint  Patient presents with  . Follow-up    Pt states still feeling SOB especially with movement. Denies fever, cough, or chest tightness   on prednisone 20 mg  Plus neb 3 tid including am of ov due to variable sob day >>noct  rec Plan A = automatic = dulera 100 Take 2 puffs first thing in am  and then another 2 puffs about 12 hours later.  Work on inhaler technique:  relax and gently blow all the way out then take a nice smooth deep breath back in, triggering the inhaler at same time you start breathing in.  Hold for up to 5 seconds if you can.  Rinse and gargle with water when done Plan B = backup Only use your albuterol [place] as a rescue medication  Plan C = neb albuterol, only use it if try B first and it doesn't work Continue  prednisone to 20 mg daily      01/12/2015 f/u ov/Wert re: asthma vs pseudoasthma/just on physilogic steroids per Dr Buddy Duty plus dulera 100 2bid Chief Complaint  Patient presents with  . Follow-up    Pt states that her breathing is doing well. She is using albuterol inhaler and neb both 2 x daily on average.     More day than noct events / having choking  episodes more than wheezing  Concern re tachycardia by primary care so metaprolol rec but she never started it.   No obvious patterns in  day to day or daytime variabilty or assoc excess or purulent sputum  or cp or chest tightness, subjective wheeze overt sinus or hb symptoms. No unusual exp hx or h/o childhood pna/ asthma or knowledge of premature birth.  Sleeping ok without nocturnal  or early am exacerbation  of respiratory  c/o's or need for noct saba. Also denies any obvious fluctuation of symptoms with weather or environmental changes or other aggravating or alleviating factors except as outlined above   Current Medications, Allergies, Complete Past Medical History, Past Surgical History, Family History, and Social History were reviewed in [employer] record.  ROS  The following are not active complaints unless bolded sore throat, dysphagia, dental problems, itching, sneezing,  nasal congestion or excess/ purulent secretions, ear ache,   fever, chills, sweats, unintended wt loss, pleuritic or exertional cp, hemoptysis,  orthopnea pnd or leg swelling, presyncope, palpitations, heartburn, abdominal pain, anorexia, nausea, vomiting, diarrhea  or change in bowel or urinary habits, change in stools or urine, dysuria,hematuria,  rash, arthralgias, visual complaints, headache, numbness weakness or ataxia or problems with walking or coordination,  change in mood/affect or memory.                 Objective:   Physical Exam   Mildly hoarse  very cushingnoid amb hispanic female nad / still pseudowheeze with fvc   01/12/2015           186   Wt Readings from Last 3 Encounters:  12/15/14 188 lb (85.276 kg)  12/15/14 187 lb (84.823 kg)  11/17/14 187 lb 6.4 oz (85.004 kg)    Vital signs reviewed       HEENT: nl dentition, turbinates, and orophanx. Nl external ear canals without cough reflex   NECK :  without JVD/Nodes/TM/ nl carotid upstrokes bilaterally   LUNGS:  no acc muscle use, clear to A and P bilaterally without cough on insp or exp maneuvers -   CV:  RRR  no s3 or murmur or increase in P2, no edema   ABD:  soft and nontender with nl excursion in the supine position. No bruits or organomegaly, bowel sounds nl  MS:  warm without deformities, calf tenderness, cyanosis or clubbing  SKIN: warm and dry without lesions          CXR PA and Lateral:   09/16/2014 :  Lung volumes  are low. Small focus of airspace disease in the left upper lobe seen on the prior study has resolved. Mild basilar atelectasis is noted. No pneumothorax or pleural effusion.     Assessment & Plan:

## 2015-01-12 NOTE — Patient Instructions (Addendum)
Whenever congested/ wheezing/ coughing > use flutter as much as possible   Only use your albuterol (ventolin) as a rescue medication to be used if you can't catch your breath by resting or doing a relaxed purse lip breathing pattern.  - The less you use it, the better it will work when you need it. - Ok to use up to 2 puffs  every 4 hours if you must but call for immediate appointment if use goes up over your usual need - Don't leave home without it !!  (think of it like the spare tire for your car)    Only use the nebulizer if the ventolin fails to work.   Follow the medication calendar as written today    Do not take metaprolol for now instead try to use your albuterol less by using the flutter valve when you are uncomfortable  Please schedule a follow up office visit in 4 weeks, call sooner if needed to see Tammy NP with all meds in hand and your med calendar    Siempre congestionada / sibilancias / tos> aleteo [employer] sea posible  Slo use su albuterol (Ventolin) como medicacin de rescate que se utilizar si no se puede recuperar el aliento al descansar o hacer un patrn de respiracin del monedero de labios relajados. - Cuanto menos se use, mejor funcionar cuando lo necesite. - Ok para Risk manager un mximo de 2 inhalaciones cada 4 horas si es necesario, pero llame para una cita inmediata en caso de utilizacin sube por encima de su necesidad habitual - No salga de casa sin ella !! (Pensar en l como el neumtico de repuesto para su coche)   Slo utilice el nebulizador si el Ventolin no funciona.  Sigue el calendario de la medicacin como escrito hoy  No tome metoprolol por ahora en su lugar intenta Risk manager su albuterol menos mediante el uso de la vlvula de aleteo cuando se siente incmodo  Por favor, programar una visita de seguimiento de oficinas en 4 semanas, llame antes si es necesario para ver Tammy NP con todos los medicamentos en la mano y su calendario med

## 2015-01-13 ENCOUNTER — Ambulatory Visit
Admission: RE | Admit: 2015-01-13 | Discharge: 2015-01-13 | Disposition: A | Discharge status: Home / Self Care | Payer: Medicaid Other | Source: Ambulatory Visit | Attending: Family Medicine | Admitting: Family Medicine

## 2015-01-13 ENCOUNTER — Encounter: Payer: Self-pay | Admitting: Family Medicine

## 2015-01-13 ENCOUNTER — Ambulatory Visit (HOSPITAL_BASED_OUTPATIENT_CLINIC_OR_DEPARTMENT_OTHER): Payer: Medicaid Other | Admitting: *Deleted

## 2015-01-13 ENCOUNTER — Ambulatory Visit (HOSPITAL_COMMUNITY)
Admission: RE | Admit: 2015-01-13 | Discharge: 2015-01-13 | Disposition: A | Discharge status: Home / Self Care | Payer: Medicaid Other | Source: Ambulatory Visit | Attending: Family Medicine | Admitting: Family Medicine

## 2015-01-13 ENCOUNTER — Other Ambulatory Visit: Payer: Self-pay | Admitting: Family Medicine

## 2015-01-13 ENCOUNTER — Ambulatory Visit (HOSPITAL_BASED_OUTPATIENT_CLINIC_OR_DEPARTMENT_OTHER): Payer: Medicaid Other | Admitting: Family Medicine

## 2015-01-13 VITALS — BP 118/79 | HR 106 | Temp 97.9°F | Resp 20

## 2015-01-13 DIAGNOSIS — M7989 Other specified soft tissue disorders: Secondary | ICD-10-CM | POA: Diagnosis present

## 2015-01-13 DIAGNOSIS — E1165 Type 2 diabetes mellitus with hyperglycemia: Secondary | ICD-10-CM

## 2015-01-13 DIAGNOSIS — L539 Erythematous condition, unspecified: Secondary | ICD-10-CM

## 2015-01-13 DIAGNOSIS — R000 Tachycardia, unspecified: Secondary | ICD-10-CM | POA: Insufficient documentation

## 2015-01-13 DIAGNOSIS — I10 Essential (primary) hypertension: Secondary | ICD-10-CM

## 2015-01-13 HISTORY — DX: Tachycardia, unspecified: R00.0

## 2015-01-13 LAB — COMPLETE METABOLIC PANEL WITH GFR
ALT: 98 U/L — ABNORMAL HIGH (ref 0–35)
AST: 87 U/L — ABNORMAL HIGH (ref 0–37)
Albumin: 3.9 g/dL (ref 3.5–5.2)
Alkaline Phosphatase: 116 U/L (ref 39–117)
BUN: 8 mg/dL (ref 6–23)
CO2: 24 mEq/L (ref 19–32)
Calcium: 9.3 mg/dL (ref 8.4–10.5)
Chloride: 101 mEq/L (ref 96–112)
Creat: 0.73 mg/dL (ref 0.50–1.10)
GFR, Est African American: >89 mL/min
GFR, Est Non African American: >89 mL/min
Glucose, Bld: 151 mg/dL — ABNORMAL HIGH (ref 70–99)
Potassium: 4.3 mEq/L (ref 3.5–5.3)
Sodium: 136 mEq/L (ref 135–145)
Total Bilirubin: 0.4 mg/dL (ref 0.2–1.2)
Total Protein: 7 g/dL (ref 6.0–8.3)

## 2015-01-13 NOTE — Patient Instructions (Addendum)
Mrs. [name],   1. R leg swelling with redness and dusky 3rd toe: Stop bactrim due to itching, face rash and no improvement in symptoms Advised patient to call if redness gets worse then I will prescribe clindamycin  Evaluation: CBC with diff, BMP, sed rate  Imaging: x-ray, venous doppler, ABI Continue daily aspirin    2. Face rash: I suspect reaction from bactrim, stop septra.   3. elevated HR: do not take  metoprolol. HR is better just slightly elevated.   You will be called with results   F/u in 7-10 days for R leg pain, swelling redness   Dr. [facility] Blackwater

## 2015-01-13 NOTE — Assessment & Plan Note (Signed)
Face redness with itching:  I suspect reaction from bactrim, stop septra.

## 2015-01-13 NOTE — Progress Notes (Signed)
   Subjective:    Patient ID: Raven Turner, female    DOB: 07/14/1971, 44 y.o.   MRN: [id number] CC: R leg redness with swelling and pain, tachycardia, face rash  HPI 44 yo F on chronic steroids, steroid induced Cushing syndrome, DM2, osteoporosis with compression fractures  1. R leg redness and swelling: symptoms started 8 days ago. Patient presented to care. She saw the SD provider. She was started on septra for presumed early cellulitis. Additionally, she was started on metoprolol for tachycardia. She reports no improvement or worsening in redness. She still has moderate anterior shin, dorsal foot and 3rd toe pain. She now has duskiness of the R 3rd toe. She denies SOB, fever, chills, trauma. She has developed facial redness and itching since starting septra. She did not start metoprolol for tachycardia.    Soc Hx: non smoker  Review of Systems  Constitutional: Negative for chills and fatigue.  Respiratory: Negative for shortness of breath.   Cardiovascular: Positive for palpitations and leg swelling. Negative for chest pain.  Skin: Positive for color change.       Objective:   Physical Exam  LMP 01/07/2015 (Exact Date) General appearance: alert, cooperative, no distress and morbidly obese Lungs: clear to auscultation bilaterally Heart: S1, S2 normal and elevated HR, regular rhythm  Extremities: obese legs, trace edema b/l, R shin with erythema that is mild there is no skin dimpling or streaking erythema. Erythema is localized to anterior shin. No calf tenderness or redness. Very faint erythema on R dorsal foot. R 3rd toe is dusky (pale blue). There is 2 + DP pulse. Full ROM of R ankle. No lesions or skin breakdown on foot.  Skin: Skin color, texture, turgor normal. No rashes or lesions      Assessment & Plan:

## 2015-01-13 NOTE — Progress Notes (Signed)
Spoke with patient via IT sales professional, [place] Patient presents for BP check, CBG and record review for T2DM, however, patient did not bring log or meter.  Patient given blood sugar log and instructed on use. Instructed to bring to all future visits. Med list reviewed; patient reports taking all meds as directed except she did not start metoprolol given for increased heart rate at last week SDV Patient reports AM fasting blood sugars ranging 135-350 Patient reports before lunch blood sugars ranging 200-300 Patient reports before dinner blood sugars ranging 250-280 Patient reports before bed blood sugars ranging 180-300 Denies increased urination C/o increased thirst "all the time" States seen last week for LE edema by NP, now with color changes to third right toe  Third right toe appears dusky in color  CBG 150 AM fasting per patient  Lab Results  Component Value Date   HGBA1C 9.40 12/15/2014   Filed Vitals:   01/13/15 0920  BP: 118/79  Pulse: 106  Temp: 97.9 F (36.6 C)  Resp: 20    Patient placed on PCPs schedule to be seen immediately

## 2015-01-13 NOTE — Assessment & Plan Note (Signed)
R leg swelling with redness and dusky 3rd toe: Stop bactrim due to itching, face rash and no improvement in symptoms Advised patient to call if redness gets worse then I will prescribe clindamycin  Evaluation: CBC with diff, BMP, sed rate  Imaging: x-ray, venous doppler, ABI Continue daily aspirin

## 2015-01-13 NOTE — Assessment & Plan Note (Signed)
A: tachycardia improved. Patient not taking metoprolol.  P: Elevated HR: do not take  metoprolol. HR is better just slightly elevated.  Checking D dimer

## 2015-01-14 ENCOUNTER — Ambulatory Visit (HOSPITAL_BASED_OUTPATIENT_CLINIC_OR_DEPARTMENT_OTHER)
Admission: RE | Admit: 2015-01-14 | Discharge: 2015-01-14 | Disposition: A | Discharge status: Home / Self Care | Payer: Medicaid Other | Source: Ambulatory Visit | Attending: Family Medicine | Admitting: Family Medicine

## 2015-01-14 ENCOUNTER — Telehealth: Payer: Self-pay | Admitting: *Deleted

## 2015-01-14 ENCOUNTER — Ambulatory Visit (HOSPITAL_COMMUNITY)
Admission: RE | Admit: 2015-01-14 | Discharge: 2015-01-14 | Disposition: A | Discharge status: Home / Self Care | Payer: Medicaid Other | Source: Ambulatory Visit | Attending: Family Medicine | Admitting: Family Medicine

## 2015-01-14 DIAGNOSIS — L539 Erythematous condition, unspecified: Secondary | ICD-10-CM

## 2015-01-14 DIAGNOSIS — M79609 Pain in unspecified limb: Secondary | ICD-10-CM

## 2015-01-14 LAB — CBC WITH DIFFERENTIAL/PLATELET
Basophils Absolute: 0.1 10*3/uL (ref 0.0–0.1)
Basophils Relative: 1 % (ref 0–1)
Eosinophils Absolute: 0.2 10*3/uL (ref 0.0–0.7)
Eosinophils Relative: 2 % (ref 0–5)
HCT: 39.5 % (ref 36.0–46.0)
Hemoglobin: 12.8 g/dL (ref 12.0–15.0)
Lymphocytes Relative: 22 % (ref 12–46)
Lymphs Abs: 2.5 10*3/uL (ref 0.7–4.0)
MCH: 25.6 pg — ABNORMAL LOW (ref 26.0–34.0)
MCHC: 32.4 g/dL (ref 30.0–36.0)
MCV: 79 fL (ref 78.0–100.0)
MPV: 9.8 fL (ref 8.6–12.4)
Monocytes Absolute: 0.9 10*3/uL (ref 0.1–1.0)
Monocytes Relative: 8 % (ref 3–12)
Neutro Abs: 7.5 10*3/uL (ref 1.7–7.7)
Neutrophils Relative %: 67 % (ref 43–77)
Platelets: 447 10*3/uL — ABNORMAL HIGH (ref 150–400)
RBC: 5 MIL/uL (ref 3.87–5.11)
RDW: 16.1 % — ABNORMAL HIGH (ref 11.5–15.5)
WBC: 11.2 10*3/uL — ABNORMAL HIGH (ref 4.0–10.5)

## 2015-01-14 LAB — D-DIMER, QUANTITATIVE: D-Dimer, Quant: 0.64 ug/mL-FEU — ABNORMAL HIGH (ref 0.00–0.48)

## 2015-01-14 NOTE — Progress Notes (Signed)
VASCULAR LAB PRELIMINARY  1.  Right lower extremity venous duplex completed:  Right:  No evidence of DVT, superficial thrombosis, or Baker's cyst.  2.  ABI completed:    RIGHT    LEFT    PRESSURE WAVEFORM  PRESSURE WAVEFORM  BRACHIAL 130 Triphasic  BRACHIAL 133 Triphasic   DP 131 Monophasic DP 125 Biphasic   AT   AT    PT 135 Biphasic  PT 135 Biphasic   PER   PER    GREAT TOE  NA GREAT TOE  NA    RIGHT LEFT  ABI 1.02  Within normal limits  1.02  Within normal limits      Fain Francis, RVT 01/14/2015, 12:34 PM

## 2015-01-14 NOTE — Telephone Encounter (Signed)
-----   Message from Boykin Nearing, MD sent at 01/14/2015  8:49 AM EDT ----- Slightly elevated liver enzymes Slightly elevated WBC on oral steroids Slightly elevated D-dimer Be sure to have venous doppler and ABI done today, there may be a blood clot. Will await results to treat. Take daily aspirin 325 mg.

## 2015-01-14 NOTE — Telephone Encounter (Signed)
-----   Message from Boykin Nearing, MD sent at 01/13/2015  1:19 PM EDT ----- No fracture in R leg x-ray

## 2015-01-14 NOTE — Telephone Encounter (Signed)
Pt aware of results Results given in Spanish  Has F/U appointment this Friday

## 2015-01-14 NOTE — Telephone Encounter (Signed)
-----   Message from Boykin Nearing, MD sent at 01/13/2015  1:20 PM EDT ----- No fracture in R foot x-ray

## 2015-01-16 ENCOUNTER — Encounter: Payer: Self-pay | Admitting: Family Medicine

## 2015-01-16 ENCOUNTER — Ambulatory Visit: Payer: Medicaid Other | Attending: Family Medicine | Admitting: Family Medicine

## 2015-01-16 ENCOUNTER — Ambulatory Visit: Payer: Medicaid Other | Admitting: Family Medicine

## 2015-01-16 VITALS — BP 127/81 | HR 100 | Temp 98.5°F | Resp 16 | Ht <= 58 in | Wt 187.0 lb

## 2015-01-16 DIAGNOSIS — IMO0002 Reserved for concepts with insufficient information to code with codable children: Secondary | ICD-10-CM

## 2015-01-16 DIAGNOSIS — L539 Erythematous condition, unspecified: Secondary | ICD-10-CM | POA: Diagnosis present

## 2015-01-16 DIAGNOSIS — Z6839 Body mass index (BMI) 39.0-39.9, adult: Secondary | ICD-10-CM | POA: Insufficient documentation

## 2015-01-16 DIAGNOSIS — E1165 Type 2 diabetes mellitus with hyperglycemia: Secondary | ICD-10-CM | POA: Diagnosis not present

## 2015-01-16 DIAGNOSIS — E669 Obesity, unspecified: Secondary | ICD-10-CM | POA: Diagnosis not present

## 2015-01-16 DIAGNOSIS — E781 Pure hyperglyceridemia: Secondary | ICD-10-CM | POA: Insufficient documentation

## 2015-01-16 MED ORDER — ATORVASTATIN CALCIUM 20 MG PO TABS: 20.0000 mg | ORAL_TABLET | Freq: Every day | ORAL | Status: DC

## 2015-01-16 MED ORDER — DIPHENHYDRAMINE HCL 25 MG PO TABS: 25.0000 mg | ORAL_TABLET | Freq: Three times a day (TID) | ORAL | Status: DC | PRN

## 2015-01-16 NOTE — Assessment & Plan Note (Signed)
Leg erythema: looking better off antibiotics Elevate leg No blood clot Good blood flow in leg

## 2015-01-16 NOTE — Assessment & Plan Note (Signed)
Elevated triglycerides and diabetes Lipitor 20 mg daily

## 2015-01-16 NOTE — Patient Instructions (Signed)
[name],  1. Leg erythema: looking better off antibiotics Elevate leg No blood clot Good blood flow in leg  2. Elevated triglycerides and diabetes Lipitor 20 mg daily  3. Skin itching, dry with rash Benadryl 25-50 mg nightly   F/u in 6 weeks   Dr. [facility] Blackwater

## 2015-01-16 NOTE — Progress Notes (Signed)
Pt is here following up on her HTN, diabetes and her cellulitis on her right leg. Pt states that her leg is a little better. Interpreter [phone]

## 2015-01-16 NOTE — Progress Notes (Signed)
   Subjective:    Patient ID: Raven Turner, female    DOB: [birth year], 44 y.o.   MRN: [id number] CC: f/u leg cellulitis  Spanish interpreter # [phone] HPI   1. Leg cellulitis: improving off bactrim. D dimer slightly elevated. Normal LE doppler. Normal ABI.   2. Skin rash: erythematous skin rash with itching. Burning with cream. No antihistamine tried yet.   3. Elevated TGs on recent lipid panel: patent with DM2. Not on statin. Wondering what to do?   Soc Hx: non smoker  Review of Systems  Constitutional: Negative for fever and chills.  Skin: Positive for color change and rash. Negative for pallor and wound.       Objective:   Physical Exam BP 127/81 mmHg  Pulse 100  Temp(Src) 98.5 F (36.9 C) (Oral)  Resp 16  Ht 4\' 10"  (1.473 m)  Wt 187 lb (84.823 kg)  BMI 39.09 kg/m2  SpO2 97%  LMP 01/07/2015 (Exact Date) General appearance: alert, cooperative, no distress and moderately obese Extremities: R leg with mild erythema in lower leg, it is not circumferentials, no skin dimpling. L forearm: small tender nodule in lateral forearm  Skin: dry, slightly flaky, mild erythema       Assessment & Plan:

## 2015-01-16 NOTE — Assessment & Plan Note (Signed)
Skin itching, dry with rash Benadryl 25-50 mg nightly

## 2015-02-02 ENCOUNTER — Encounter: Payer: Medicaid Other | Attending: Family Medicine | Admitting: Dietician

## 2015-02-02 VITALS — Wt 182.0 lb

## 2015-02-02 DIAGNOSIS — Z794 Long term (current) use of insulin: Secondary | ICD-10-CM | POA: Insufficient documentation

## 2015-02-02 DIAGNOSIS — Z713 Dietary counseling and surveillance: Secondary | ICD-10-CM | POA: Diagnosis not present

## 2015-02-02 DIAGNOSIS — IMO0002 Reserved for concepts with insufficient information to code with codable children: Secondary | ICD-10-CM

## 2015-02-02 DIAGNOSIS — E1165 Type 2 diabetes mellitus with hyperglycemia: Secondary | ICD-10-CM | POA: Insufficient documentation

## 2015-02-02 NOTE — Progress Notes (Signed)
Medical Nutrition Therapy:  Appt start time: 0800 end time:  0930.   Assessment: 12/29/14 Primary concerns today: Patient is here today with her husband and the interpretor, Graciela Namihira.  She would like to learn how to better control her diabetes..   She states that she has been diagnosed with type 2 diabetes about 4 years.  Her HgbA1C is 9.4% 12/15/14. Checks CGB 4 times per day, Before breakfast:  110-130, before lunch:  >200, before dinner:  >200, before bed:  >250-300.   As high as 350-400.  Patient states that she will be seeing Dr. Buddy Duty today.    Multiple verebral compression fractures noted.  States that she is on prednisone for asthma which has cauased osteoporosis.  Hx includes MRSA.    Patient lives with her husband.  Patient does the cooking and shopping.  They are from [place]  02/02/15: Patient is here today with her husband and interpretor Truitt Merle.  Weight was 182 lbs today, 18e lbs one month ago.  CGB's slightly lower that last month.  She is not getting the highs of 300-400 per patient.  Usually 130-150 before breakfast, 200 before lunch and dinner and 250-300 before bed.  States that she is having difficulty changing to low fat milk and unsweetened cereal.  She is drinking 1 cup juice with meds.  She reports a poor appetite for the past 2 weeks but does not know why.    Preferred Learning Style:   No preference indicated   Learning Readiness:   Ready  MEDICATIONS: 50 units Novolog before meals. 90 units Lantus each night, 500 mg Glucophage bid (she does not tolerate a higher dose.  She also reports an allergy to the hydrocortisone and is now on prednisone.  See list for other.   DIETARY INTAKE: No red meat  24-hr recall:  B (9 AM): cereal (plain cheerios or frosted flakes)- 1 1/2-2 cups and whole milk 2 slices [employer] bread with cheese, ham and mayo, and banana and black coffee Snk ( AM): none  L (1:30 PM): rice and beans, chicken, Snk ( PM): none D (6-7 PM):  rice and beans Snk ( PM): if sugar is low- regular yogurt Beverages: water, black coffee  Usual physical activity: patient is wearing a full back brace.  She does not exercise.  Will walk if back and asthma OK.   Estimated energy needs: 1400 calories 158 g carbohydrates 105 g protein 39 g fat  Progress Towards Goal(s):  In progress.   Nutritional Diagnosis:  NB-1.1 Food and nutrition-related knowledge deficit As related to balance of carbohydrates, protein, and fat.  As evidenced by diet hx.    Intervention:  Nutrition counseling and diabetes education continued.   Discussed avoiding juice, decreasing fat content of milk, sugar content of cereal, and increase vegetable intake.  Reviewed My Plate and portion control.  Plan:  Aim for 3 Carb Choices per meal (45 grams) +/- 1 either way  Aim for 0-1 Carbs per snack if hungry  Include protein in moderation with your meals and snacks Consider reading food labels for Total Carbohydrate and Fat Grams of foods  Plan, La meta es tres carbohidratos por comida (45 gramos)   Tomar 0-1 porcion de carbohidrato por  bocadillo ( snack ), si tiene hambre. Incluya proteina con moderacion con sus comidas y botanas. Considere leer las etiquetas de la comida para ver el total de carbohidratos y gramos de grasa de las comidas.   Teaching Method Utilized:  Visual Auditory Hands on  Handouts given during visit include:  Spanish my plate  Spanish meal plan card  Label reading  Spanish Living with Diabetes  Barriers to learning/adherence to lifestyle change: language barrier  Demonstrated degree of understanding via:  Teach Back   Monitoring/Evaluation:  Dietary intake, exercise, label reading, and body weight in 2 months.Marland Kitchen

## 2015-02-10 ENCOUNTER — Ambulatory Visit: Payer: Medicaid Other | Admitting: Internal Medicine

## 2015-02-16 ENCOUNTER — Ambulatory Visit (INDEPENDENT_AMBULATORY_CARE_PROVIDER_SITE_OTHER): Payer: Medicaid Other | Admitting: Internal Medicine

## 2015-02-16 ENCOUNTER — Telehealth: Payer: Self-pay | Admitting: Family Medicine

## 2015-02-16 ENCOUNTER — Encounter: Payer: Self-pay | Admitting: Internal Medicine

## 2015-02-16 VITALS — BP 140/92 | HR 94 | Ht <= 58 in | Wt 177.8 lb

## 2015-02-16 DIAGNOSIS — S22080A Wedge compression fracture of T11-T12 vertebra, initial encounter for closed fracture: Secondary | ICD-10-CM

## 2015-02-16 DIAGNOSIS — I10 Essential (primary) hypertension: Secondary | ICD-10-CM

## 2015-02-16 DIAGNOSIS — E249 Cushing's syndrome, unspecified: Secondary | ICD-10-CM

## 2015-02-16 LAB — BASIC METABOLIC PANEL
BUN: 13 mg/dL (ref 6–23)
CO2: 27 mEq/L (ref 19–32)
Calcium: 9.6 mg/dL (ref 8.4–10.5)
Chloride: 104 mEq/L (ref 96–112)
Creatinine, Ser: 0.67 mg/dL (ref 0.40–1.20)
GFR: 102.25 mL/min (ref >60.00)
Glucose, Bld: 139 mg/dL — ABNORMAL HIGH (ref 70–99)
Potassium: 3.6 mEq/L (ref 3.5–5.1)
Sodium: 138 mEq/L (ref 135–145)

## 2015-02-16 LAB — BRAIN NATRIURETIC PEPTIDE: Pro B Natriuretic peptide (BNP): 28 pg/mL (ref 0.0–100.0)

## 2015-02-16 MED ORDER — TRAMADOL HCL 50 MG PO TABS
50.0000 mg | ORAL_TABLET | Freq: Three times a day (TID) | ORAL | Status: DC | PRN
Start: 2015-02-16 — End: 2015-02-27

## 2015-02-16 NOTE — Telephone Encounter (Signed)
LVM RX at front office

## 2015-02-16 NOTE — Patient Instructions (Signed)
Medication Instructions:  Your physician recommends that you continue on your current medications as directed. Please refer to the Current Medication list given to you today.  Labwork: Your physician recommends that you have lab work today: BMP and BNP  Testing/Procedures: No new orders.   Follow-Up: Your physician recommends that you schedule a follow-up appointment in: August with Dr Harrington Challenger   Any Other Special Instructions Will Be Listed Below (If Applicable).

## 2015-02-16 NOTE — Progress Notes (Signed)
Cardiology Office Note   Date:  02/16/2015   ID:  Raven Turner, DOB [birth year], MRN [id number]  PCP:  Minerva Ends, MD  Cardiologist:   Dorris Carnes, MD   Chief Complaint  Patient presents with  . Follow-up    essential hypertension      History of Present Illness: Raven Turner is a 44 y.o. female with a history of Distolic dysfunction and HTN  I saw her in March of this year.  She had dad some burning sensation SOB with spells . Also a history of DM and COPD/asthma When I saw her her HR was increased  As well as BP  I increased Verelan to 360  Increased lasix for a couple days then resumed 40 mg   She has been see by Selinda Orion  Rx for astham eacerbation with ABX and prednisone    Seen by primary MD  Celluilitis  Rx ABX  Normalarterial and venous dopplers    She hasw had intermitt CP Occurs without activity  Sharp L sided  Worse with breath    Current Outpatient Prescriptions  Medication Sig Dispense Refill  . albuterol (PROAIR HFA) 108 (90 BASE) MCG/ACT inhaler 2 puffs every 4 hours as needed only  if your can't catch your breath 1 Inhaler 5  . albuterol (PROVENTIL) (2.5 MG/3ML) 0.083% nebulizer solution Take 2.5 mg by nebulization every 4 (four) hours as needed for wheezing or shortness of breath.    Marland Kitchen aspirin 325 MG tablet Take 1 tablet (325 mg total) by mouth daily. 30 tablet 3  . atorvastatin (LIPITOR) 20 MG tablet Take 1 tablet (20 mg total) by mouth daily. 30 tablet 11  . Blood Glucose Monitoring Suppl (ACCU-CHEK AVIVA PLUS) W/DEVICE KIT Used as directed 1 kit 0  . cholecalciferol (VITAMIN D) 1000 UNITS tablet Take 1 tablet (1,000 Units total) by mouth daily. 30 tablet 11  . clonazePAM (KLONOPIN) 0.5 MG tablet Take 1 tablet (0.5 mg total) by mouth 2 (two) times daily as needed (trouble sleeping and anxiety). 30 tablet 2  . cloNIDine (CATAPRES) 0.2 MG tablet Take 1 tablet (0.2 mg total) by mouth 3 (three) times daily. 90 tablet 11  .  dextromethorphan-guaiFENesin (MUCINEX DM) 30-600 MG per 12 hr tablet Take 1 tablet by mouth 2 (two) times daily as needed (cough, congestion).    . diphenhydrAMINE (BENADRYL) 25 MG tablet Take 1 tablet (25 mg total) by mouth every 8 (eight) hours as needed. 30 tablet 0  . ferrous sulfate 325 (65 FE) MG tablet Take 1 tablet (325 mg total) by mouth daily with breakfast. 30 tablet 3  . FOLIC ACID PO Take 1 tablet by mouth every morning.    . furosemide (LASIX) 40 MG tablet Take 40 mg by mouth every morning.    . gabapentin (NEURONTIN) 400 MG capsule Take 2 capsules (800 mg total) by mouth 2 (two) times daily. 120 capsule 3  . glucose blood (RELION GLUCOSE TEST STRIPS) test strip 1 each by Other route 4 (four) times daily. ICD code E11.65 400 each 3  . glucose blood test strip Use as instructed 100 each 12  . hydrocortisone (CORTEF) 10 MG tablet Take 10 mg by mouth daily. 1.5 tabs in AM and .5 tabs in evening    . insulin aspart (NOVOLOG) 100 UNIT/ML injection Inject 40 Units into the skin 3 (three) times daily before meals. (Patient taking differently: Inject 60 Units into the skin 3 (three) times daily before meals. )  3 vial PRN  . Insulin Glargine (LANTUS SOLOSTAR) 100 UNIT/ML Solostar Pen Inject 100 Units into the skin daily at 10 pm. 5 pen PRN  . Insulin Pen Needle (B-D ULTRAFINE III SHORT PEN) 31G X 8 MM MISC 1 application by Does not apply route daily. 100 each 3  . Insulin Syringe-Needle U-100 (BD INSULIN SYRINGE ULTRAFINE) 31G X 15/64" 1 ML MISC 1 each by Does not apply route 3 (three) times daily. 300 each 3  . Lancet Devices (ACCU-CHEK SOFTCLIX) lancets Use as instructed 1 each 0  . losartan (COZAAR) 100 MG tablet Take 1 tablet (100 mg total) by mouth daily. 30 tablet 3  . metFORMIN (GLUCOPHAGE) 1000 MG tablet Take 1 tablet (1,000 mg total) by mouth 2 (two) times daily with a meal. 60 tablet 11  . methocarbamol (ROBAXIN) 750 MG tablet Take 750 mg by mouth 3 (three) times daily.    .  mometasone-formoterol (DULERA) 100-5 MCG/ACT AERO Take 2 puffs first thing in am and then another 2 puffs about 12 hours later. 13 g 6  . montelukast (SINGULAIR) 10 MG tablet Take 10 mg by mouth at bedtime.    Marland Kitchen omeprazole (PRILOSEC) 20 MG capsule Take 1 capsule (20 mg total) by mouth daily. 90 capsule 1  . Oyster Shell Calcium 500 MG TABS Take 0.4 tablets (500 mg total) by mouth 2 (two) times daily after a meal. 60 tablet 11  . PARoxetine (PAXIL) 20 MG tablet Take 20 mg by mouth daily.    . predniSONE (DELTASONE) 10 MG tablet Take 10 mg by mouth daily with breakfast.    . ranitidine (ZANTAC) 300 MG tablet Take 1 tablet (300 mg total) by mouth at bedtime. 90 tablet 1  . ReliOn Ultra Thin Lancets MISC 1 each by Does not apply route 4 (four) times daily. ICD code E11.65 400 each 3  . Respiratory Therapy Supplies (FLUTTER) DEVI Use as directed 1 each 0  . traMADol (ULTRAM) 50 MG tablet 1-2 every 4 hours as needed for cough or pain 40 tablet 0  . verapamil (VERELAN PM) 360 MG 24 hr capsule Take 360 mg by mouth at bedtime.   11   No current facility-administered medications for this visit.    Allergies:   Shellfish allergy; Hydrocortisone; and Septra   Past Medical History  Diagnosis Date  . Asthma   . Cushing syndrome 2013   . Depression   . Anemia   . Osteopenia 11/19/2013    Dx with osteopenia in lumbar vertebra with partial compression of L1  . Diabetes 2013   . Diabetes mellitus with neuropathy   . HTN (hypertension) 2005   . COPD (chronic obstructive pulmonary disease) 2013  . Compression fracture     Past Surgical History  Procedure Laterality Date  . Cataract extraction Bilateral 2014  . Vaginal hysterectomy       Social History:  The patient  reports that she has never smoked. She has never used smokeless tobacco. She reports that she does not drink alcohol or use illicit drugs.   Family History:  The patient's family history includes Asthma in her father; Diabetes in her  mother; Stroke in her father.    ROS:  Please see the history of present illness. All other systems are reviewed and  Negative to the above problem except as noted.    PHYSICAL EXAM: VS:  BP 140/92 mmHg  Pulse 94  Ht 4' 10"  (1.473 m)  Wt 177 lb 12.8 oz (80.65  kg)  BMI 37.17 kg/m2  SpO2 97%  LMP 02/06/2015  GEN: Morbidly obese 44 yo in NAD   HEENT: normal Neck: no JVD, carotid bruits, or masses Cardiac: RRR; no murmurs, rubs, or gallops,1+ edema  Respiratory:  clear to auscultation bilaterally, normal work of breathing GI: soft, nontender, nondistended, + BS  No hepatomegaly  MS: no deformity Moving all extremities   Skin: warm and dry, no rash Neuro:  Strength and sensation are intact Psych: euthymic mood, full affect   EKG:  EKG is not ordered today.   Lipid Panel    Component Value Date/Time   CHOL 163 12/15/2014 1051   TRIG 192.0* 12/15/2014 1051   HDL 45.90 12/15/2014 1051   CHOLHDL 4 12/15/2014 1051   VLDL 38.4 12/15/2014 1051   LDLCALC 79 12/15/2014 1051      Wt Readings from Last 3 Encounters:  02/16/15 177 lb 12.8 oz (80.65 kg)  02/02/15 182 lb (82.555 kg)  01/16/15 187 lb (84.823 kg)      ASSESSMENT AND PLAN: 1.  CP  Seems atypical  For now I would she appeasr to be breathing better  I would follow  I will see back in Aug  Consider dobutamine myoview given hx of DM  2.  Pulm  Breathing appears signif improved from when I saw her in March  Cont to folow in pulm  3.  HTN  BP is improved with increase in Verelan  She could go up even higher but her BP in medicine clinic was 120  I do not want her to go too low With asthma history I am reluctant to add metoprolol (given to her by another MD)  4.  Diastolic dysfunction  Will get labs  Consider lasix 60 2 to 3 x per wk    4=5.  Lipids  Patient started on lipitor  I did not do this on last visit given how she was felling  ON now  Tolerating  Will need f/u    Disposition:   FU with me in  Aug  Signed, Dorris Carnes, MD  02/16/2015 9:03 AM    [address] [place] [place], [place], [place]  [address] Phone: [phone]; Fax: [phone]

## 2015-02-16 NOTE — Telephone Encounter (Signed)
Please inform patient that tramadol has been refilled.

## 2015-02-16 NOTE — Telephone Encounter (Signed)
Pt calling to request refill on Tramadol, please f/u with pt.

## 2015-02-17 ENCOUNTER — Ambulatory Visit (INDEPENDENT_AMBULATORY_CARE_PROVIDER_SITE_OTHER): Payer: Medicaid Other | Admitting: Adult Health

## 2015-02-17 ENCOUNTER — Encounter: Payer: Self-pay | Admitting: Adult Health

## 2015-02-17 VITALS — BP 158/92 | HR 101 | Temp 98.2°F | Ht <= 58 in | Wt 177.0 lb

## 2015-02-17 DIAGNOSIS — J45998 Other asthma: Secondary | ICD-10-CM

## 2015-02-17 DIAGNOSIS — R058 Other specified cough: Secondary | ICD-10-CM

## 2015-02-17 DIAGNOSIS — R05 Cough: Secondary | ICD-10-CM

## 2015-02-17 NOTE — Progress Notes (Signed)
Chart and office note reviewed in detail  > agree with a/p as outlined    

## 2015-02-17 NOTE — Progress Notes (Signed)
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  Onset was childhood but continued to have "good days and bad days" until 2010 and no good days since then even p pred started and no better since arrived in [place] summer 2015 with no evidence of any airflow obst by pfts 11/03/14    History of Present Illness  09/04/2014 1st [facility] office visit/ [facility]   Chief Complaint  Patient presents with  . Advice Only    Referred for chronic bronchitis. Recently moved from [place].   Every day's about the same since 2012 admitted to [place] every month but not since arrival in [place]/   maintaining on prednisone ? Dose plus theoph/ brethine/ advair 500 and nebs "all day long"  But interestingly less symptoms nocturnally. Cough is mostly dry assoc with hoarseness  rec Stop the advair, the brethine  and the theophylline Start dulera 100 Take 2 puffs first thing in am and then another 2 puffs about 12 hours later.  Continue prilosec 20 mg Take 30- 60 min before your first and last meals of the day  GERD diet  Only use your albuterol nebulizer  If needed  If you find you don't need the nebulizer as much, then we can start reducing your prednisone by 10 mg a week   Admit date: 09/07/2014 Discharge date: 09/09/2014  Discharge Diagnoses:  Active Hospital Problems   Diagnosis Date Noted  . Pneumonia 09/07/2014  . Morbid obesity 09/08/2014  . Non-English speaking patient 09/08/2014  . Diabetes mellitus type 2, uncontrolled 09/07/2014  . Asthma exacerbation 09/07/2014  . Hypertension 09/07/2014  . HTN (hypertension)      Patient is a 44 year old Hispanic female, non-English speaking with past  history of asthma on chronic prednisone, diabetes mellitus and hypertension who was admitted to the hospitalist service on the night of 12/27 for several days of persistent productive cough, wheezing and shortness of breath. Patient was noted in the emergency room to have a mild leukocytosis as well as an early developing left upper lobe pneumonia. Patient started on nebulizers, oxygen and antibiotics.  Hospital Course:  Principal Problem:  Acute respiratory failure/Asthma exacerbation/Pneumonia: Continue IV Levaquin, add incentive spirometer, changed by mouth prednisone to IV Solu-Medrol given significant wheezing. Patient and mildly tachycardic so adding back oxygen. Added nebulizers. By 12/29, patient looked to be significantly better and off of oxygen. With ambulation, she contraction saturations at 96% although she did have some significant tachycardia. Part of this looked to be from pain from her hip which is chronic. There is also some question based off of her pulmonologist note that the way she takes her inhalers to be causing her to do more harm than good. As per their recommendation, theophylline discontinued. Patient will continue on 5 more days of by mouth Levaquin to complete a seven-day course for this pneumonia Active Problems:  HTN (hypertension): Continue home meds  Diabetes mellitus type 2, uncontrolled with peripheral neuropathy: Continue Lantus, scheduled NovoLog plus sliding scale plus Neurontin. With sodium Medrol, CBGs even more elevated and patient required increased dose of Lantus and sliding scale  Hypertension: Continue home meds  Morbid obesity: Patient meets criteria with  BMI greater than 40.      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 HFU    Pt states that her breathing has improved some, but not back to baseline. SHe is still coughing up yellow sputum.    using neb saba avg  twice daily, feels theoph helps with "congestion" but able to do adls ok   >d/c theophylline/breathine   09/26/2014  Follow up and Med Review (Asthma/Steroid dependent)  Interpreter present for today's visit  patient does understand limited English, husband reads and understands more English Patient reports that she's had severe asthma as a child and has been on and off steroids dose of her life She is currently on prednisone 40 mg. Patient has been in the [place] for around 6-7 months Previously treated in [place]. She says she's had several hospitalizations. Most recent hospitalization was 2 weeks ago, she is starting to feel somewhat improved. Patient was seen in office last week. Her theophylline and Brethine was stopped. Patient has brought all of her medications in today for review We reviewed all her medications organize them into a medication count with patient education Patient is on multiple medications from [place] and locally. She is taking both budesonide and Dulera. Patient has had multiple steroid related complications including cataracts, compression fx and  Diabetes. She is currently being evaluated for right arm swelling. She has MRI and 2-D echo pending. She was treated with doxycycline. She denies any chest pain, orthopnea, PND, leg swelling, abdominal pain, nausea, vomiting. Does not have any PFTs on record. rec Stop Budesonide neb  Decrease Prednisone 61m 2 tabs alternating with 1.5 tabs for 2 weeks then 1.5 tabs daily for 2 weeks and then 1 tab daily and hold at this dose.  Follow med calendar closely and bring to each visit.     11/03/2014 f/u ov/Wert re: dtca asthma/ no rx today/ pred still at 30 mg  Chief Complaint  Patient presents with  . Follow-up    PFT done today. Pt states that her cough has resolved. Her breathing is some better. No new co's today.   still badly misunderstanding how to take her meds / unable to doing meaningful or accurate med rec    >>decrease pred 20     11/17/2014 Follow up : Difficult to control Asthma-steroid dependent /NP ov Interpreter is present for today's visit.  We reviewed all her medications organize them into a medication calendar with patient education It appears the patient is taking her medications correctly. Last visit. Patient's prednisone was decreased to 20 mg daily She has not had a flare of cough, wheezing or shortness of breath since last visit. rec In 2 weeks on 12/01/14 decrease Prednisone 241malternating 1070maily until seen back in offce.  Follow med calendar closely and bring to each visit.  Please contact office for sooner follow up if symptoms do not improve or worsen or seek emergency care     12/15/2014 f/u ov/Wert re: chronic asthma/ pseudoasthma / [place] med [place]  Chief Complaint  Patient presents with  . Follow-up    Pt states still feeling SOB especially with movement. Denies fever, cough, or chest tightness   on prednisone 20 mg  Plus neb 3 tid including am of ov due to variable sob day >>noct  rec Plan A = automatic = dulera 100 Take 2 puffs first thing in am  and then another 2 puffs about 12 hours later.  Work on inhaler technique:  relax and gently blow all the way out then take a nice smooth deep breath back in, triggering the inhaler at same time you start breathing in.  Hold for up to 5 seconds if you can.  Rinse and gargle with water when done Plan B = backup Only use your albuterol [place] as a rescue medication  Plan C = neb albuterol, only use it if try B first and it doesn't work Continue  prednisone to 20 mg daily      01/12/2015 f/u ov/Wert re: asthma vs pseudoasthma/just on physilogic steroids per Dr Buddy Duty plus dulera 100 2bid Chief Complaint  Patient presents with  . Follow-up    Pt states that her breathing is doing well. She is using albuterol inhaler and neb both 2 x daily on average.     More day than noct events / having choking  episodes more than wheezing  Concern re tachycardia by primary care so metaprolol rec but she never started it.  >>no changes   02/17/2015 Follow up and Med review : asthma vs pseudoasthma/just on physilogic steroids per Dr Buddy Duty plus dulera 100 2bid Patient is accompanied by an interpreter Patient returns for a 4 week follow-up and medication review We reviewed all her medications organize them into a medication count with patient education Appears to be taking her medications correctly Patient says overall her breathing has been stable. She had a couple nights where she has some intermittent wheezing but this has improved. She appears the been changed to hydrocortisone. However, was unable to tolerate this and is now back on prednisone 10 mg daily. She denies any chest pain, orthopnea, PND, increased leg swelling or fever.     Current Medications, Allergies, Complete Past Medical History, Past Surgical History, Family History, and Social History were reviewed in [employer] record.  ROS  The following are not active complaints unless bolded sore throat, dysphagia, dental problems, itching, sneezing,  nasal congestion or excess/ purulent secretions, ear ache,   fever, chills, sweats, unintended wt loss, pleuritic or exertional cp, hemoptysis,  orthopnea pnd or leg swelling, presyncope, palpitations, heartburn, abdominal pain, anorexia, nausea, vomiting, diarrhea  or change in bowel or urinary habits, change in stools or urine, dysuria,hematuria,  rash, arthralgias, visual complaints, headache, numbness weakness or ataxia or problems with walking or coordination,  change in mood/affect or memory.                 Objective:   Physical Exam   Mildly hoarse  very cushingnoid amb hispanic female nad   01/12/2015           186 >177 02/17/2015     Vital signs reviewed       HEENT: nl dentition, turbinates, and orophanx. Nl external ear canals without cough  reflex   NECK :  without JVD/Nodes/TM/ nl carotid upstrokes bilaterally   LUNGS: no acc muscle use, clear to A and P bilaterally without cough on insp or exp maneuvers -   CV:  RRR  no s3 or murmur or increase in P2, no edema   ABD:  soft and nontender with nl excursion in the supine position. No bruits or organomegaly, bowel sounds nl  MS:  warm without deformities, calf tenderness, cyanosis or clubbing Back brace  SKIN: warm and dry without lesions          CXR PA and Lateral:  09/16/2014 :  Lung volumes are low. Small focus of airspace disease in the left upper lobe seen on the prior study has resolved. Mild basilar atelectasis is noted. No pneumothorax or pleural effusion.     Assessment & Plan:

## 2015-02-17 NOTE — Assessment & Plan Note (Signed)
Improved on current regimen

## 2015-02-17 NOTE — Patient Instructions (Signed)
Follow med calendar closely and bring to each visit.  Follow up Dr. Melvyn Novas  In 6 -8 weeks and As needed   Please contact office for sooner follow up if symptoms do not improve or worsen or seek emergency care

## 2015-02-17 NOTE — Assessment & Plan Note (Addendum)
Improved control  PLAN  Follow med calendar closely and bring to each visit.  Follow up Dr. Melvyn Novas  In 6 -8 weeks and As needed   Please contact office for sooner follow up if symptoms do not improve or worsen or seek emergency care

## 2015-02-20 ENCOUNTER — Other Ambulatory Visit: Payer: Self-pay | Admitting: *Deleted

## 2015-02-20 DIAGNOSIS — E878 Other disorders of electrolyte and fluid balance, not elsewhere classified: Secondary | ICD-10-CM

## 2015-02-25 NOTE — Addendum Note (Signed)
Addended by: Inge Rise on: 02/25/2015 04:33 PM   Modules accepted: Orders, Medications

## 2015-02-27 ENCOUNTER — Ambulatory Visit: Payer: Medicaid Other | Attending: Family Medicine | Admitting: Family Medicine

## 2015-02-27 ENCOUNTER — Encounter: Payer: Self-pay | Admitting: Family Medicine

## 2015-02-27 VITALS — BP 130/80 | HR 100 | Temp 98.4°F | Resp 16 | Ht <= 58 in | Wt 177.0 lb

## 2015-02-27 DIAGNOSIS — H9203 Otalgia, bilateral: Secondary | ICD-10-CM | POA: Insufficient documentation

## 2015-02-27 DIAGNOSIS — L539 Erythematous condition, unspecified: Secondary | ICD-10-CM | POA: Insufficient documentation

## 2015-02-27 DIAGNOSIS — M899 Disorder of bone, unspecified: Secondary | ICD-10-CM

## 2015-02-27 DIAGNOSIS — T380X5D Adverse effect of glucocorticoids and synthetic analogues, subsequent encounter: Secondary | ICD-10-CM | POA: Diagnosis not present

## 2015-02-27 DIAGNOSIS — E242 Drug-induced Cushing's syndrome: Secondary | ICD-10-CM | POA: Diagnosis not present

## 2015-02-27 DIAGNOSIS — M898X0 Other specified disorders of bone, multiple sites: Secondary | ICD-10-CM | POA: Insufficient documentation

## 2015-02-27 DIAGNOSIS — M818 Other osteoporosis without current pathological fracture: Secondary | ICD-10-CM | POA: Diagnosis not present

## 2015-02-27 DIAGNOSIS — M898X9 Other specified disorders of bone, unspecified site: Secondary | ICD-10-CM

## 2015-02-27 DIAGNOSIS — M8088XS Other osteoporosis with current pathological fracture, vertebra(e), sequela: Secondary | ICD-10-CM | POA: Insufficient documentation

## 2015-02-27 DIAGNOSIS — M4854XA Collapsed vertebra, not elsewhere classified, thoracic region, initial encounter for fracture: Secondary | ICD-10-CM

## 2015-02-27 DIAGNOSIS — S22080A Wedge compression fracture of T11-T12 vertebra, initial encounter for closed fracture: Secondary | ICD-10-CM

## 2015-02-27 DIAGNOSIS — Z6837 Body mass index (BMI) 37.0-37.9, adult: Secondary | ICD-10-CM | POA: Insufficient documentation

## 2015-02-27 DIAGNOSIS — T380X5A Adverse effect of glucocorticoids and synthetic analogues, initial encounter: Secondary | ICD-10-CM

## 2015-02-27 HISTORY — DX: Other specified disorders of bone, unspecified site: M89.8X9

## 2015-02-27 MED ORDER — ZOLEDRONIC ACID 5 MG/100ML IV SOLN: 5.0000 mg | Freq: Once | INTRAVENOUS | Status: DC

## 2015-02-27 MED ORDER — ACETAMINOPHEN-CODEINE #3 300-30 MG PO TABS: 1.0000 | ORAL_TABLET | Freq: Every evening | ORAL | Status: DC | PRN

## 2015-02-27 MED ORDER — INSULIN ASPART 100 UNIT/ML ~~LOC~~ SOLN: 60.0000 [IU] | Freq: Three times a day (TID) | SUBCUTANEOUS | Status: DC

## 2015-02-27 NOTE — Patient Instructions (Addendum)
Mrs. Raven Turner,  Thank you for coming in today   1. Ear aches and sore throat: x one week. Mild erythema. No evidence of bacterial infection.  Stay well hydrated Drink warm or cold items to soothe throat  2. Osteoporosis: Ordered reclast IV infusion   3. Bone pain: Tylenol #3 Stop tramadol to see if it helps with itching   4. Leg redness and swelling:  Most likely related to allergy to hydrocortisone Improved Changing for tramadol to tylenol #3 for itching   F/u in 1 month for diabetes follow up   Dr. Adrian Blackwater

## 2015-02-27 NOTE — Assessment & Plan Note (Signed)
Bone pain: no red flags for malignancy  Tylenol #3 Stop tramadol to see if it helps with itching

## 2015-02-27 NOTE — Progress Notes (Signed)
F/U Cellulitis

## 2015-02-27 NOTE — Assessment & Plan Note (Signed)
Leg redness and swelling:  Most likely related to allergy to hydrocortisone Improved Changing for tramadol to tylenol #3 for itching

## 2015-02-27 NOTE — Assessment & Plan Note (Signed)
1. Ear aches and sore throat: x one week. Mild erythema. No evidence of bacterial infection.  Stay well hydrated Drink warm or cold items to soothe throat

## 2015-02-27 NOTE — Progress Notes (Signed)
   Subjective:    Patient ID: Raven Turner, female    DOB: [birth year], 44 y.o.   MRN: [id number] CC: Follow-up left leg erythema Spanish interpreter present HPI 44 year old female with history of steroid-induced osteoporosis and Cushing syndrome presents for follow-up with her sister  1. Left leg erythema: Left leg erythema is initially treated as cellulitis but actually discontinue when there was no improvement. Since last visit patient reports complete resolution of erythema since transitioning back to prednisone from hydrocortisone. She also reports resolution of facial redness and swelling. Hydrocortisone has since been listed as an allergy. Patient still has pruritus on her left lower leg. She denies bug bites, skin rash and new skin products.   #2 steroid-induced osteoporosis: Patient has multiple vertebral compression fractures. She's compliant with calcium and vitamin D. She was started on oral bisphosphonate (discontinued due to GERD and high risk for esophagitis.   #3 bilateral ear and throat soreness: Patient reports 5 days of bilateral ear and throat soreness. She denies cough, fever, congestion, runny nose, sick contacts, In change in voice.   4. Bone pain: in extremities mostly legs > arms. No fever, chills, night sweats, weight loss. Taking tramadol nightly for pain.   Social history: Nonsmoker  Review of Systems  Constitutional: Negative for fever and chills.  HENT: Positive for ear pain and sore throat. Negative for ear discharge.   Musculoskeletal: Positive for myalgias.       Objective:   Physical Exam BP 130/80 mmHg  Pulse 100  Temp(Src) 98.4 F (36.9 C) (Oral)  Resp 16  Ht 4\' 10"  (1.473 m)  Wt 177 lb (80.287 kg)  BMI 37.00 kg/m2  SpO2 100%  LMP 02/04/2015  Wt Readings from Last 3 Encounters:  02/27/15 177 lb (80.287 kg)  02/17/15 177 lb (80.287 kg)  02/16/15 177 lb 12.8 oz (80.65 kg)  General appearance: alert, cooperative, no distress and morbidly  obese Ears: normal TM's and external ear canals both ears Nose: no discharge, turbinates mildly swollen and mild erythema  Throat: normal findings: palate normal, tongue midline and normal, soft palate, uvula, and tonsils normal and oropharynx mild erythema  Neck: no adenopathy and thyroid not enlarged, symmetric, no tenderness/mass/nodules Extremities: edema trace b.l with evidence of excoriation on LL leg anteriorly        Assessment & Plan:

## 2015-02-27 NOTE — Assessment & Plan Note (Addendum)
Osteoporosis: Ordered reclast IV infusion

## 2015-03-10 ENCOUNTER — Telehealth: Payer: Self-pay | Admitting: Family Medicine

## 2015-03-10 MED ORDER — ZOLEDRONIC ACID 5 MG/100ML IV SOLN
5.0000 mg | Freq: Once | INTRAVENOUS | Status: AC
  Administered 2015-03-11: 5 mg via INTRAVENOUS

## 2015-03-10 NOTE — Telephone Encounter (Signed)
Called back to [facility]  Closed for the day Left VM  Went into encounter for reclast infusion scheduled for tomorrow, ordered reclast and a BMP.   Asked for a call back if additional orders are needed

## 2015-03-10 NOTE — Telephone Encounter (Signed)
Lavurn from [facility] is calling because this pt's orders were never placed for -Reclast-. No one ever faxed the form and the pt has to have certain labs before receiving infusion. Pt has an apopintment tomorrow at 11am. Please follow up with Lavurn. Thank you.

## 2015-03-10 NOTE — Telephone Encounter (Signed)
Pt calling to request refill on 'stomach medication', please f/u with pt.

## 2015-03-11 ENCOUNTER — Other Ambulatory Visit: Payer: Self-pay | Admitting: *Deleted

## 2015-03-11 ENCOUNTER — Encounter (HOSPITAL_COMMUNITY)
Admission: RE | Admit: 2015-03-11 | Discharge: 2015-03-11 | Disposition: A | Discharge status: Home / Self Care | Payer: Medicaid Other | Source: Ambulatory Visit | Attending: Family Medicine | Admitting: Family Medicine

## 2015-03-11 DIAGNOSIS — S22080A Wedge compression fracture of T11-T12 vertebra, initial encounter for closed fracture: Secondary | ICD-10-CM

## 2015-03-11 DIAGNOSIS — M810 Age-related osteoporosis without current pathological fracture: Secondary | ICD-10-CM | POA: Diagnosis not present

## 2015-03-11 LAB — BASIC METABOLIC PANEL
Anion gap: 11 (ref 5–15)
BUN: 9 mg/dL (ref 6–20)
CO2: 23 mmol/L (ref 22–32)
Calcium: 9.6 mg/dL (ref 8.9–10.3)
Chloride: 102 mmol/L (ref 101–111)
Creatinine, Ser: 0.81 mg/dL (ref 0.44–1.00)
GFR calc Af Amer: >60 mL/min (ref >60)
GFR calc non Af Amer: >60 mL/min (ref >60)
Glucose, Bld: 318 mg/dL — ABNORMAL HIGH (ref 65–99)
Potassium: 4.1 mmol/L (ref 3.5–5.1)
Sodium: 136 mmol/L (ref 135–145)

## 2015-03-11 MED ORDER — ZOLEDRONIC ACID 5 MG/100ML IV SOLN
INTRAVENOUS | Status: AC
  Filled 2015-03-11: qty 100

## 2015-03-11 MED ORDER — OMEPRAZOLE 20 MG PO CPDR: 20.0000 mg | DELAYED_RELEASE_CAPSULE | Freq: Every day | ORAL | Status: DC

## 2015-03-11 MED ORDER — RANITIDINE HCL 150 MG PO TABS: 300.0000 mg | ORAL_TABLET | Freq: Every day | ORAL | Status: DC

## 2015-03-11 NOTE — Telephone Encounter (Signed)
Refill Rx omeprazole send to [facility]

## 2015-03-11 NOTE — Discharge Instructions (Signed)
Zoledronic Acid injection (Paget's Disease, Osteoporosis) Qu es este medicamento? El CIDO ZOLEDRNICO reduce la prdida del calcio de los [place]. Se utiliza para tratar la enfermedad de Paget y osteoporosis en mujeres. Este medicamento puede ser utilizado para otros usos; si tiene alguna pregunta consulte con su proveedor de atencin mdica o con su farmacutico. MARCAS COMERCIALES DISPONIBLES: Reclast, Zometa Qu le debo informar a mi profesional de la salud antes de tomar este medicamento? Necesita saber si usted presenta alguno de los siguientes problemas o situaciones: -asma sensible a la aspirina -cncer, especialmente si est recibiendo medicamentos que se usan para tratar el cncer -enfermedad dental o [place] dentadura postiza -infeccin -enfermedad renal -niveles bajos de calcio en la sangre -ciruga previa de la glndula paratiroidea o intestinales -est recibiendo corticoesteroides como dexametasona o prednisona -una reaccin alrgica o inusual al cido zoledrnico, a otros medicamentos, alimentos, colorantes o conservantes -si est embarazada o buscando quedar embarazada -si est amamantando a un beb Cmo debo utilizar este medicamento? Este medicamento se administra mediante infusin por va intravenosa. Lo administra un profesional de Technical sales engineer en un hospital o en un entorno clnico. Hable con su pediatra para informarse acerca del uso de este medicamento en nios. Este medicamento no est aprobado para uso en nios. Sobredosis: Pngase en contacto inmediatamente con un centro toxicolgico o una sala de urgencia si usted cree que haya tomado demasiado medicamento. ATENCIN: [employer] es solo para usted. No comparta este medicamento con nadie. Qu sucede si me olvido de una dosis? Es importante no olvidar ninguna dosis. Informe a su mdico o a su profesional de la salud si no puede asistir a Photographer. Qu puede interactuar con este medicamento? -ciertos antibiticos  administrados por inyeccin -los AINE, medicamentos para Conservation officer, historic buildings o inflamacin, como ibuprofeno o naproxeno -ciertos diurticos bumetanida o furosemida -teriparatida Puede ser que esta lista no menciona todas las posibles interacciones. Informe a su profesional de [employer]	[place] de [employer] productos a base de hierbas, medicamentos de [place] o suplementos nutritivos que est tomando. Si usted fuma, consume bebidas alcohlicas o si utiliza drogas ilegales, indqueselo tambin a su profesional de [employer]	[place]. Algunas sustancias pueden interactuar con su medicamento. A qu debo estar atento al usar [employer]? Visite a su mdico o a su profesional de la salud para chequeos peridicos. Puede ser necesario que transcurra cierto tiempo antes de que pueda observar los beneficios de [place]. No deje de tomar su medicamento excepto si as lo indica su mdico. Su mdico puede pedirle anlisis de [place] u otras pruebas para Chief Technology Officer su evolucin. Las mujeres deben informar a su mdico si estn buscando quedar embarazadas o si creen que estn embarazadas. Existe la posibilidad de efectos secundarios graves a un beb sin nacer. Para ms informacin hable con su profesional de la salud o su farmacutico. Es importante que usted reciba la cantidad [place] de calcio y vitamina D mientras recibe este medicamento. Consulte a su profesional de [employer] alimentos y vitamina que toma. Algunas personas que toman este medicamento experimentan dolor grave de [place], de articulaciones o/y de msculos. Este medicamento tambin puede aumentar el riesgo de problemas de la [place] o un fmur roto. Informe a su mdico inmediatamente si tiene dolor de mandbula, articulaciones o msculos. Si experimenta dolor que no desaparece o que empeora, informe a su mdico. Informe a su dentista y [place] dental que est usando este medicamento. No debera tener Office manager que est Microsoft.  Visite a su dentista para someterse a un examen dental y arreglar cualquier problema dental antes de comenzar este medicamento. Cuide sus dientes mientras est [employer]. Asegrese de visitar a su dentista para citas de control regulares. Qu efectos secundarios puedo tener al [employer] este medicamento? Efectos secundarios que debe informar a su mdico o a Barrister's clerk de la salud tan pronto como sea posible: -Chief of Staff como erupcin cutnea, picazn o urticarias, hinchazn de la cara, labios o lengua -ansiedad, confusin o depresin -problemas respiratorios -cambios en la visin -dolor de ojos -sensacin de desmayos o aturdimiento, cadas -dolor de mandbula, especialmente despus de tratamiento dental -llagas en la boca -calambres, rigidez o debilidad de la msculos -dificultad para orinar o cambios en el volumen de orina Efectos secundarios que, por lo general, no requieren atencin mdica (debe informarlos a su mdico o a su profesional de la salud si persisten o si son molestos): -dolor de huesos, articulares, musculares -estreimiento -diarrea -fiebre -cada de cabello -irritacin en el lugar de la inyeccin -prdida del apetito -nuseas, vmito -malestar estomacal -dificultad para conciliar el sueo -dificultad para tragar -cansancio o debilidad Puede ser que esta lista no menciona todos los posibles efectos secundarios. Comunquese a su mdico por asesoramiento mdico [employer]. Usted puede informar los efectos secundarios a la FDA por telfono al 1-800-FDA-1088. Dnde debo guardar mi medicina? Este medicamento se administra en hospitales o clnicas y no necesitar guardarlo en su domicilio. ATENCIN: Este folleto es un resumen. Puede ser que no cubra toda la posible informacin. Si usted tiene preguntas acerca de esta medicina, consulte con su mdico, su farmacutico o su profesional de Technical sales engineer.  2015,  Elsevier/Gold Standard. (2013-03-22 16:12:01) Zoledronic Acid injection (Paget's Disease, Osteoporosis) What is this medicine? ZOLEDRONIC ACID (ZOE le dron ik AS id) lowers the amount of calcium loss from bone. It is used to treat Paget's disease and osteoporosis in women. This medicine may be used for other purposes; ask your health care provider or pharmacist if you have questions. COMMON BRAND NAME(S): Reclast, Zometa What should I tell my health care provider before I take this medicine? They need to know if you have any of these conditions: -aspirin-sensitive asthma -cancer, especially if you are receiving medicines used to treat cancer -dental disease or wear dentures -infection -kidney disease -low levels of calcium in the blood -past surgery on the parathyroid gland or intestines -receiving corticosteroids like dexamethasone or prednisone -an unusual or allergic reaction to zoledronic acid, other medicines, foods, dyes, or preservatives -pregnant or trying to get pregnant -breast-feeding How should I use this medicine? This medicine is for infusion into a vein. It is given by a health care professional in a hospital or clinic setting. Talk to your pediatrician regarding the use of this medicine in children. This medicine is not approved for use in children. Overdosage: If you think you have taken too much of this medicine contact a poison control center or emergency room at once. NOTE: This medicine is only for you. Do not share this medicine with others. What if I miss a dose? It is important not to miss your dose. Call your doctor or health care professional if you are unable to keep an appointment. What may interact with this medicine? -certain antibiotics given by injection -NSAIDs, medicines for pain and inflammation, like ibuprofen or naproxen -some diuretics like bumetanide, furosemide -teriparatide This list may not describe all possible interactions. Give your health  care provider a list of all  the medicines, herbs, non-prescription drugs, or dietary supplements you use. Also tell them if you smoke, drink alcohol, or use illegal drugs. Some items may interact with your medicine. What should I watch for while using this medicine? Visit your doctor or health care professional for regular checkups. It may be some time before you see the benefit from this medicine. Do not stop taking your medicine unless your doctor tells you to. Your doctor may order blood tests or other tests to see how you are doing. Women should inform their doctor if they wish to become pregnant or think they might be pregnant. There is a potential for serious side effects to an unborn child. Talk to your health care professional or pharmacist for more information. You should make sure that you get enough calcium and vitamin D while you are taking this medicine. Discuss the foods you eat and the vitamins you take with your health care professional. Some people who take this medicine have severe bone, joint, and/or muscle pain. This medicine may also increase your risk for jaw problems or a broken thigh bone. Tell your doctor right away if you have severe pain in your jaw, bones, joints, or muscles. Tell your doctor if you have any pain that does not go away or that gets worse. Tell your dentist and dental surgeon that you are taking this medicine. You should not have major dental surgery while on this medicine. See your dentist to have a dental exam and fix any dental problems before starting this medicine. Take good care of your teeth while on this medicine. Make sure you see your dentist for regular follow-up appointments. What side effects may I notice from receiving this medicine? Side effects that you should report to your doctor or health care professional as soon as possible: -allergic reactions like skin rash, itching or hives, swelling of the face, lips, or tongue -anxiety, confusion, or  depression -breathing problems -changes in vision -eye pain -feeling faint or lightheaded, falls -jaw pain, especially after dental work -mouth sores -muscle cramps, stiffness, or weakness -trouble passing urine or change in the amount of urine Side effects that usually do not require medical attention (report to your doctor or health care professional if they continue or are bothersome): -bone, joint, or muscle pain -constipation -diarrhea -fever -hair loss -irritation at site where injected -loss of appetite -nausea, vomiting -stomach upset -trouble sleeping -trouble swallowing -weak or tired This list may not describe all possible side effects. Call your doctor for medical advice about side effects. You may report side effects to FDA at 1-800-FDA-1088. Where should I keep my medicine? This drug is given in a hospital or clinic and will not be stored at home. NOTE: This sheet is a summary. It may not cover all possible information. If you have questions about this medicine, talk to your doctor, pharmacist, or health care provider.  2015, Elsevier/Gold Standard. (2013-02-11 10:03:48) Zoledronic Acid injection (Paget's Disease, Osteoporosis) What is this medicine? ZOLEDRONIC ACID (ZOE le dron ik AS id) lowers the amount of calcium loss from bone. It is used to treat Paget's disease and osteoporosis in women. This medicine may be used for other purposes; ask your health care provider or pharmacist if you have questions. COMMON BRAND NAME(S): Reclast, Zometa What should I tell my health care provider before I take this medicine? They need to know if you have any of these conditions: -aspirin-sensitive asthma -cancer, especially if you are receiving medicines used to treat cancer -dental  disease or wear dentures -infection -kidney disease -low levels of calcium in the blood -past surgery on the parathyroid gland or intestines -receiving corticosteroids like dexamethasone or  prednisone -an unusual or allergic reaction to zoledronic acid, other medicines, foods, dyes, or preservatives -pregnant or trying to get pregnant -breast-feeding How should I use this medicine? This medicine is for infusion into a vein. It is given by a health care professional in a hospital or clinic setting. Talk to your pediatrician regarding the use of this medicine in children. This medicine is not approved for use in children. Overdosage: If you think you have taken too much of this medicine contact a poison control center or emergency room at once. NOTE: This medicine is only for you. Do not share this medicine with others. What if I miss a dose? It is important not to miss your dose. Call your doctor or health care professional if you are unable to keep an appointment. What may interact with this medicine? -certain antibiotics given by injection -NSAIDs, medicines for pain and inflammation, like ibuprofen or naproxen -some diuretics like bumetanide, furosemide -teriparatide This list may not describe all possible interactions. Give your health care provider a list of all the medicines, herbs, non-prescription drugs, or dietary supplements you use. Also tell them if you smoke, drink alcohol, or use illegal drugs. Some items may interact with your medicine. What should I watch for while using this medicine? Visit your doctor or health care professional for regular checkups. It may be some time before you see the benefit from this medicine. Do not stop taking your medicine unless your doctor tells you to. Your doctor may order blood tests or other tests to see how you are doing. Women should inform their doctor if they wish to become pregnant or think they might be pregnant. There is a potential for serious side effects to an unborn child. Talk to your health care professional or pharmacist for more information. You should make sure that you get enough calcium and vitamin D while you are  taking this medicine. Discuss the foods you eat and the vitamins you take with your health care professional. Some people who take this medicine have severe bone, joint, and/or muscle pain. This medicine may also increase your risk for jaw problems or a broken thigh bone. Tell your doctor right away if you have severe pain in your jaw, bones, joints, or muscles. Tell your doctor if you have any pain that does not go away or that gets worse. Tell your dentist and dental surgeon that you are taking this medicine. You should not have major dental surgery while on this medicine. See your dentist to have a dental exam and fix any dental problems before starting this medicine. Take good care of your teeth while on this medicine. Make sure you see your dentist for regular follow-up appointments. What side effects may I notice from receiving this medicine? Side effects that you should report to your doctor or health care professional as soon as possible: -allergic reactions like skin rash, itching or hives, swelling of the face, lips, or tongue -anxiety, confusion, or depression -breathing problems -changes in vision -eye pain -feeling faint or lightheaded, falls -jaw pain, especially after dental work -mouth sores -muscle cramps, stiffness, or weakness -trouble passing urine or change in the amount of urine Side effects that usually do not require medical attention (report to your doctor or health care professional if they continue or are bothersome): -bone, joint, or muscle  pain -constipation -diarrhea -fever -hair loss -irritation at site where injected -loss of appetite -nausea, vomiting -stomach upset -trouble sleeping -trouble swallowing -weak or tired This list may not describe all possible side effects. Call your doctor for medical advice about side effects. You may report side effects to FDA at 1-800-FDA-1088. Where should I keep my medicine? This drug is given in a hospital or clinic  and will not be stored at home. NOTE: This sheet is a summary. It may not cover all possible information. If you have questions about this medicine, talk to your doctor, pharmacist, or health care provider.  2015, Elsevier/Gold Standard. (2013-02-11 10:03:48)

## 2015-03-12 ENCOUNTER — Telehealth: Payer: Self-pay | Admitting: *Deleted

## 2015-03-12 NOTE — Telephone Encounter (Signed)
-----   Message from Boykin Nearing, MD sent at 03/11/2015 12:42 PM EDT ----- Normal BMP Hyperglycemia, how our sugars running at home, make RN visit for diabetes check

## 2015-03-12 NOTE — Telephone Encounter (Signed)
Pt aware of results  Has F/U appointment with PCP

## 2015-03-24 ENCOUNTER — Other Ambulatory Visit: Payer: Self-pay | Admitting: Family Medicine

## 2015-03-24 MED ORDER — PREDNISONE 5 MG PO TABS: 5.0000 mg | ORAL_TABLET | Freq: Every day | ORAL | Status: DC

## 2015-03-27 ENCOUNTER — Ambulatory Visit: Payer: Medicaid Other | Attending: Family Medicine | Admitting: Family Medicine

## 2015-03-27 ENCOUNTER — Encounter: Payer: Self-pay | Admitting: Family Medicine

## 2015-03-27 VITALS — BP 131/79 | HR 91 | Temp 98.3°F | Resp 16 | Ht <= 58 in | Wt 178.0 lb

## 2015-03-27 DIAGNOSIS — S32010S Wedge compression fracture of first lumbar vertebra, sequela: Secondary | ICD-10-CM | POA: Diagnosis not present

## 2015-03-27 DIAGNOSIS — IMO0002 Reserved for concepts with insufficient information to code with codable children: Secondary | ICD-10-CM

## 2015-03-27 DIAGNOSIS — M4854XA Collapsed vertebra, not elsewhere classified, thoracic region, initial encounter for fracture: Secondary | ICD-10-CM | POA: Diagnosis not present

## 2015-03-27 DIAGNOSIS — I10 Essential (primary) hypertension: Secondary | ICD-10-CM | POA: Diagnosis not present

## 2015-03-27 DIAGNOSIS — J45998 Other asthma: Secondary | ICD-10-CM | POA: Insufficient documentation

## 2015-03-27 DIAGNOSIS — B351 Tinea unguium: Secondary | ICD-10-CM | POA: Insufficient documentation

## 2015-03-27 DIAGNOSIS — N644 Mastodynia: Secondary | ICD-10-CM | POA: Insufficient documentation

## 2015-03-27 DIAGNOSIS — F411 Generalized anxiety disorder: Secondary | ICD-10-CM

## 2015-03-27 DIAGNOSIS — Z794 Long term (current) use of insulin: Secondary | ICD-10-CM | POA: Insufficient documentation

## 2015-03-27 DIAGNOSIS — S22080A Wedge compression fracture of T11-T12 vertebra, initial encounter for closed fracture: Secondary | ICD-10-CM

## 2015-03-27 DIAGNOSIS — M79676 Pain in unspecified toe(s): Secondary | ICD-10-CM

## 2015-03-27 DIAGNOSIS — E1165 Type 2 diabetes mellitus with hyperglycemia: Secondary | ICD-10-CM | POA: Insufficient documentation

## 2015-03-27 HISTORY — DX: Generalized anxiety disorder: F41.1

## 2015-03-27 HISTORY — DX: Tinea unguium: M79.676

## 2015-03-27 HISTORY — DX: Mastodynia: N64.4

## 2015-03-27 HISTORY — DX: Tinea unguium: B35.1

## 2015-03-27 LAB — POCT GLYCOSYLATED HEMOGLOBIN (HGB A1C): Hemoglobin A1C: 8

## 2015-03-27 LAB — GLUCOSE, POCT (MANUAL RESULT ENTRY): POC Glucose: 166 mg/dl — AB (ref 70–99)

## 2015-03-27 MED ORDER — ACETAMINOPHEN-CODEINE #3 300-30 MG PO TABS: 1.0000 | ORAL_TABLET | Freq: Every evening | ORAL | Status: DC | PRN

## 2015-03-27 MED ORDER — LOSARTAN POTASSIUM 100 MG PO TABS: 100.0000 mg | ORAL_TABLET | Freq: Every day | ORAL | Status: DC

## 2015-03-27 MED ORDER — ASPIRIN 325 MG PO TABS: 325.0000 mg | ORAL_TABLET | Freq: Every day | ORAL | Status: DC

## 2015-03-27 MED ORDER — FUROSEMIDE 40 MG PO TABS: 40.0000 mg | ORAL_TABLET | Freq: Every morning | ORAL | Status: DC

## 2015-03-27 MED ORDER — VITAMIN D3 25 MCG (1000 UNIT) PO TABS: 1000.0000 [IU] | ORAL_TABLET | Freq: Every day | ORAL | Status: DC

## 2015-03-27 MED ORDER — INSULIN GLARGINE 100 UNIT/ML SOLOSTAR PEN: 90.0000 [IU] | PEN_INJECTOR | Freq: Every day | SUBCUTANEOUS | Status: DC

## 2015-03-27 MED ORDER — ATORVASTATIN CALCIUM 20 MG PO TABS: 20.0000 mg | ORAL_TABLET | Freq: Every day | ORAL | Status: DC

## 2015-03-27 MED ORDER — RELION LANCETS ULTRA-THIN 30G MISC: 1.0000 | Freq: Four times a day (QID) | Status: DC

## 2015-03-27 MED ORDER — ALBUTEROL SULFATE HFA 108 (90 BASE) MCG/ACT IN AERS: INHALATION_SPRAY | RESPIRATORY_TRACT | Status: DC

## 2015-03-27 MED ORDER — MONTELUKAST SODIUM 10 MG PO TABS: 10.0000 mg | ORAL_TABLET | Freq: Every day | ORAL | Status: DC

## 2015-03-27 MED ORDER — "INSULIN SYRINGE-NEEDLE U-100 31G X 15/64"" 1 ML MISC": 1.0000 | Freq: Three times a day (TID) | Status: DC

## 2015-03-27 MED ORDER — INSULIN PEN NEEDLE 31G X 8 MM MISC: 1.0000 "application " | Freq: Every day | Status: DC

## 2015-03-27 MED ORDER — GLUCOSE BLOOD VI STRP: 1.0000 | ORAL_STRIP | Freq: Four times a day (QID) | Status: DC

## 2015-03-27 MED ORDER — ALBUTEROL SULFATE (2.5 MG/3ML) 0.083% IN NEBU
2.5000 mg | INHALATION_SOLUTION | RESPIRATORY_TRACT | Status: DC | PRN
Start: 2015-03-27 — End: 2015-08-21

## 2015-03-27 MED ORDER — OYSTER SHELL CALCIUM 500 MG PO TABS: 500.0000 mg | ORAL_TABLET | Freq: Two times a day (BID) | ORAL | Status: DC

## 2015-03-27 MED ORDER — CLONAZEPAM 0.5 MG PO TABS: 0.5000 mg | ORAL_TABLET | Freq: Two times a day (BID) | ORAL | Status: DC | PRN

## 2015-03-27 MED ORDER — VERAPAMIL HCL ER 360 MG PO CP24: 360.0000 mg | ORAL_CAPSULE | ORAL | Status: DC

## 2015-03-27 MED ORDER — TERBINAFINE HCL 1 % EX CREA: 1.0000 "application " | TOPICAL_CREAM | Freq: Two times a day (BID) | CUTANEOUS | Status: DC

## 2015-03-27 MED ORDER — METFORMIN HCL 500 MG PO TABS: 500.0000 mg | ORAL_TABLET | Freq: Two times a day (BID) | ORAL | Status: DC

## 2015-03-27 MED ORDER — MOMETASONE FURO-FORMOTEROL FUM 100-5 MCG/ACT IN AERO: 2.0000 | INHALATION_SPRAY | Freq: Two times a day (BID) | RESPIRATORY_TRACT | Status: DC

## 2015-03-27 MED ORDER — INSULIN ASPART 100 UNIT/ML ~~LOC~~ SOLN: 60.0000 [IU] | Freq: Three times a day (TID) | SUBCUTANEOUS | Status: DC

## 2015-03-27 NOTE — Assessment & Plan Note (Signed)
Painful toenail fungus: lamisil ordered

## 2015-03-27 NOTE — Progress Notes (Signed)
   Subjective:    Patient ID: Raven Turner, female    DOB: 26-Mar-1971, 44 y.o.   MRN: [id number] CC: DM2 f/u, breast pain  HPI  1. CHRONIC DIABETES  Disease Monitoring  Blood Sugar Ranges: < 200  Polyuria: no   Visual problems: no   Medication Compliance: yes  Medication Side Effects  Hypoglycemia: no   2. Breast pain: L breast x 3 months. Medial breast. No mass. No nipple drainage. No chills. No weight loss. Frequent hot flashes. Has fam hx of breast cancer in [facility] at a young age.   3. Painful toenails: thickened and yellowed toenails with pain. Unable to take oral lamisil due to elevated LFTs.   Soc Hx: non smoker  Review of Systems  Constitutional: Positive for fever. Negative for chills.  Gastrointestinal: Negative for abdominal distention.  Endocrine: Negative for polydipsia, polyphagia and polyuria.      Objective:   Physical Exam BP 131/79 mmHg  Pulse 91  Temp(Src) 98.3 F (36.8 C) (Oral)  Resp 16  Ht 4\' 10"  (1.473 m)  Wt 178 lb (80.74 kg)  BMI 37.21 kg/m2  SpO2 99%  LMP 02/04/2015  Wt Readings from Last 3 Encounters:  03/27/15 178 lb (80.74 kg)  03/11/15 177 lb (80.287 kg)  02/27/15 177 lb (80.287 kg)   General appearance: alert, cooperative, no distress and morbidly obese Lungs: clear to auscultation bilaterally Heart: regular rate and rhythm, S1, S2 normal, no murmur, click, rub or gallop  Breast: Breasts: normal appearance, no masses or tenderness, Inspection negative, No nipple retraction or dimpling, No nipple discharge or bleeding, No axillary or supraclavicular adenopathy, Normal to palpation without dominant masses, positive findings: breast tenderness L medial breast  Extremities: extremities normal, atraumatic, no cyanosis or edema  Toe thickened and yellow b/l great toe and R 2nd toe  Lab Results  Component Value Date   HGBA1C 8.0 03/27/2015   CBG 166     Assessment & Plan:

## 2015-03-27 NOTE — Assessment & Plan Note (Signed)
L breast pain: no mass Diagnostic mammogram ordered

## 2015-03-27 NOTE — Progress Notes (Signed)
F/U [facility] refills  

## 2015-03-27 NOTE — Patient Instructions (Signed)
Mrs. [name],  Thank you for coming in today.  1. L breast pain: no mass Diagnostic mammogram ordered   2. Diabetes: Improved control Continue current regimen  3. Painful toenail fungus: lamisil ordered   F/u in 4-6 weeks for pap smear  F/u in 3 month for diabetes   Dr. Adrian Blackwater

## 2015-03-27 NOTE — Assessment & Plan Note (Signed)
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

## 2015-03-30 ENCOUNTER — Other Ambulatory Visit: Payer: Self-pay | Admitting: Family Medicine

## 2015-03-30 ENCOUNTER — Other Ambulatory Visit: Payer: Self-pay | Admitting: *Deleted

## 2015-03-30 DIAGNOSIS — N644 Mastodynia: Secondary | ICD-10-CM

## 2015-03-30 MED ORDER — METHOCARBAMOL 750 MG PO TABS: 750.0000 mg | ORAL_TABLET | Freq: Three times a day (TID) | ORAL | Status: DC | PRN

## 2015-03-31 ENCOUNTER — Encounter: Payer: Self-pay | Admitting: Internal Medicine

## 2015-03-31 ENCOUNTER — Ambulatory Visit (INDEPENDENT_AMBULATORY_CARE_PROVIDER_SITE_OTHER): Payer: Medicaid Other | Admitting: Internal Medicine

## 2015-03-31 VITALS — BP 134/84 | HR 107 | Ht <= 58 in | Wt 180.8 lb

## 2015-03-31 DIAGNOSIS — J45998 Other asthma: Secondary | ICD-10-CM | POA: Diagnosis not present

## 2015-03-31 DIAGNOSIS — R05 Cough: Secondary | ICD-10-CM

## 2015-03-31 DIAGNOSIS — R058 Other specified cough: Secondary | ICD-10-CM

## 2015-03-31 NOTE — Progress Notes (Signed)
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  Onset was childhood but continued to have "good days and bad days" until 2010 and no good days since then even p pred started and no better since arrived in [place] summer 2015 with no evidence of any airflow obst by pfts 11/03/14    History of Present Illness  09/04/2014 1st [facility] office visit/ Dailyn Reith   Chief Complaint  Patient presents with  . Advice Only    Referred for chronic bronchitis. Recently moved from [place].   Every day's about the same since 2012 admitted to [place] every month but not since arrival in us/   maintaining on prednisone ? Dose plus theoph/ brethine/ advair 500 and nebs "all day long"  But interestingly less symptoms nocturnally. Cough is mostly dry assoc with hoarseness  rec Stop the advair, the brethine  and the theophylline Start dulera 100 Take 2 puffs first thing in am and then another 2 puffs about 12 hours later.  Continue prilosec 20 mg Take 30- 60 min before your first and last meals of the day  GERD diet  Only use your albuterol nebulizer  If needed  If you find you don't need the nebulizer as much, then we can start reducing your prednisone by 10 mg a week   Admit date: 09/07/2014 Discharge date: 09/09/2014  Discharge Diagnoses:  Active Hospital Problems   Diagnosis Date Noted  . Pneumonia 09/07/2014  . Morbid obesity 09/08/2014  . Non-English speaking patient 09/08/2014  . Diabetes mellitus type 2, uncontrolled 09/07/2014  . Asthma exacerbation 09/07/2014  . Hypertension 09/07/2014  . HTN (hypertension)      Patient is a 44 year old Hispanic female, non-English speaking with past  history of asthma on chronic prednisone, diabetes mellitus and hypertension who was admitted to the hospitalist service on the night of 12/27 for several days of persistent productive cough, wheezing and shortness of breath. Patient was noted in the emergency room to have a mild leukocytosis as well as an early developing left upper lobe pneumonia. Patient started on nebulizers, oxygen and antibiotics.  Hospital Course:  Principal Problem:  Acute respiratory failure/Asthma exacerbation/Pneumonia: Continue IV Levaquin, add incentive spirometer, changed by mouth prednisone to IV Solu-Medrol given significant wheezing. Patient and mildly tachycardic so adding back oxygen. Added nebulizers. By 12/29, patient looked to be significantly better and off of oxygen. With ambulation, she contraction saturations at 96% although she did have some significant tachycardia. Part of this looked to be from pain from her hip which is chronic. There is also some question based off of her pulmonologist note that the way she takes her inhalers to be causing her to do more harm than good. As per their recommendation, theophylline discontinued. Patient will continue on 5 more days of by mouth Levaquin to complete a seven-day course for this pneumonia Active Problems:  HTN (hypertension): Continue home meds  Diabetes mellitus type 2, uncontrolled with peripheral neuropathy: Continue Lantus, scheduled NovoLog plus sliding scale plus Neurontin. With sodium Medrol, CBGs even more elevated and patient required increased dose of Lantus and sliding scale  Hypertension: Continue home meds  Morbid obesity: Patient meets criteria with  BMI greater than 40.      09/16/2014 f/u ov/Johnnathan Hagemeister re: psueudoasthma vs asthma back on theoph/brethine and pred 40  Chief Complaint  Patient presents with  . HFU    Pt states that her breathing has improved some, but not back to baseline. SHe is still coughing up yellow sputum.    using neb saba avg  twice daily, feels theoph helps with "congestion" but able to do adls ok   >d/c theophylline/breathine   09/26/2014  Follow up and Med Review (Asthma/Steroid dependent)  Interpreter present for today's visit  patient does understand limited English, husband reads and understands more English Patient reports that she's had severe asthma as a child and has been on and off steroids dose of her life She is currently on prednisone 40 mg. Patient has been in the [place] for around 6-7 months Previously treated in [place]. She says she's had several hospitalizations. Most recent hospitalization was 2 weeks ago, she is starting to feel somewhat improved. Patient was seen in office last week. Her theophylline and Brethine was stopped. Patient has brought all of her medications in today for review We reviewed all her medications organize them into a medication count with patient education Patient is on multiple medications from [place] and locally. She is taking both budesonide and Dulera. Patient has had multiple steroid related complications including cataracts, compression fx and  Diabetes. She is currently being evaluated for right arm swelling. She has MRI and 2-D echo pending. She was treated with doxycycline. She denies any chest pain, orthopnea, PND, leg swelling, abdominal pain, nausea, vomiting. Does not have any PFTs on record. rec Stop Budesonide neb  Decrease Prednisone 52m 2 tabs alternating with 1.5 tabs for 2 weeks then 1.5 tabs daily for 2 weeks and then 1 tab daily and hold at this dose.  Follow med calendar closely and bring to each visit.     11/03/2014 f/u ov/Hussam Muniz re: dtca asthma/ no rx today/ pred still at 30 mg  Chief Complaint  Patient presents with  . Follow-up    PFT done today. Pt states that her cough has resolved. Her breathing is some better. No new co's today.   still badly misunderstanding how to take her meds / unable to doing meaningful or accurate med rec    >>decrease pred 20     11/17/2014 Follow up : Difficult to control Asthma-steroid dependent /NP ov Interpreter is present for today's visit.  We reviewed all her medications organize them into a medication calendar with patient education It appears the patient is taking her medications correctly. Last visit. Patient's prednisone was decreased to 20 mg daily She has not had a flare of cough, wheezing or shortness of breath since last visit. rec In 2 weeks on 12/01/14 decrease Prednisone 272malternating 1078maily until seen back in offce.  Follow med calendar closely and bring to each visit.  Please contact office for sooner follow up if symptoms do not improve or worsen or seek emergency care     12/15/2014 f/u ov/Shanicqua Coldren re: chronic asthma/ pseudoasthma / [facility]  Chief Complaint  Patient presents with  . Follow-up    Pt states still feeling SOB especially with movement. Denies fever, cough, or chest tightness   on prednisone 20 mg  Plus neb 3 tid including am of ov due to variable sob day >>noct  rec Plan A = automatic = dulera 100 Take 2 puffs first thing in am  and then another 2 puffs about 12 hours later.  Work on inhaler technique:  relax and gently blow all the way out then take a nice smooth deep breath back in, triggering the inhaler at same time you start breathing in.  Hold for up to 5 seconds if you can.  Rinse and gargle with water when done Plan B = backup Only use your albuterol [place] as a rescue medication  Plan C = neb albuterol, only use it if try B first and it doesn't work Continue  prednisone to 20 mg daily      01/12/2015 f/u ov/Matthan Sledge re: asthma vs pseudoasthma/just on physiologic steroids per Dr Buddy Duty plus dulera 100 2bid Chief Complaint  Patient presents with  . Follow-up    Pt states that her breathing is doing well. She is using albuterol inhaler and neb both 2 x daily on average.     More day than noct events / having choking  episodes more than wheezing  Concern re tachycardia by primary care so metaprolol rec but she never started it.  >>no changes   02/17/2015 NP Follow up and Med review : asthma vs pseudoasthma/just on physilogic steroids per Dr Buddy Duty plus dulera 100 2bid Patient is accompanied by an interpreter Patient returns for a 4 week follow-up and medication review We reviewed all her medications organize them into a medication count with patient education Appears to be taking her medications correctly Patient says overall her breathing has been stable. She had a couple nights where she has some intermittent wheezing but this has improved. She appears the been changed to hydrocortisone. However, was unable to tolerate this and is now back on prednisone 10 mg daily. rec  Follow med calendar closely and bring to each visit.    03/31/2015 f/u ov/Lorin Gawron re: dtc asthma vs vcd / has med calendar but not using it correctly / maint on dulera 100 2bid / 5 mg daily pred Chief Complaint  Patient presents with  . Follow-up    asthma: tightness in chest.  some cough, but not much. hot weather making SOB worse.  sore throat, both ears hurt.   last night took hfa sab and 2 h later the neb saba  On avg taking saba hfa  sev times a day and neb sev times a day also  Last rx 4 h prior to OV  and feels she could use it again now for sob and chest tight  No obvious day to day or daytime variability or assoc excess/ purulent mucus  or cp or subjective wheeze or overt sinus or hb symptoms. No unusual exp hx or h/o childhood pna/ asthma or knowledge of premature birth.  Sleeping ok without nocturnal  or early am exacerbation  of respiratory  c/o's or need for noct saba. Also denies any obvious fluctuation of symptoms with weather or environmental changes or other aggravating or alleviating factors except as outlined above   Current Medications, Allergies, Complete Past Medical History, Past Surgical History, Family History, and  Social History were reviewed in [employer] record.  ROS  The following are not active complaints unless bolded sore throat, dysphagia, dental problems, itching, sneezing,  nasal congestion or excess/ purulent secretions, ear ache,   fever, chills, sweats, unintended wt loss, classically pleuritic or exertional cp, hemoptysis,  orthopnea pnd or leg swelling, presyncope, palpitations, abdominal pain, anorexia, nausea, vomiting, diarrhea  or change in bowel or bladder habits, change in stools or urine, dysuria,hematuria,  rash,  arthralgias, visual complaints, headache, numbness, weakness or ataxia or problems with walking or coordination,  change in mood/affect or memory.           Objective:   Physical Exam   Mildly hoarse  very cushingnoid amb hispanic female nad   01/12/2015  186 > 177 02/17/2015 >  03/31/2015  181     Vital signs reviewed       HEENT: nl dentition, turbinates, and orophanx. Nl external ear canals without cough reflex   NECK :  without JVD/Nodes/TM/ nl carotid upstrokes bilaterally   LUNGS: no acc muscle use, clear to A and P bilaterally without cough on insp or exp maneuvers -   CV:  RRR  no s3 or murmur or increase in P2, no edema   ABD:  soft and nontender with nl excursion in the supine position. No bruits or organomegaly, bowel sounds nl  MS:  warm without deformities, calf tenderness, cyanosis or clubbing Back brace  SKIN: warm and dry without lesions              Assessment & Plan:

## 2015-03-31 NOTE — Patient Instructions (Addendum)
Continue prednisone 10 mg one half daily   Always try plan B before you use Plan C from you med calendar action plan   Please schedule a follow up office visit in 6 weeks, call sooner if needed with Tammy with all meds for new med calendar    Continuare prednisone 10 mg una met al giorno  [employer] sempre il piano B prima di utilizzare il Piano C da voi piano d'azione calendario med  Si prega di pianificare una visita di follow-up ufficio in 6 settimane, chiamare prima, se necessario, con Tammy con tutti i farmaci per la nuovo calendario med

## 2015-04-02 ENCOUNTER — Encounter: Payer: Self-pay | Admitting: Internal Medicine

## 2015-04-02 NOTE — Assessment & Plan Note (Addendum)
09/05/2014  try dulera 100 2bid and off advair   - 09/16/2014 off theoph and brethine    - 11/03/14  PFTs p am dulera 100 on 30 mg prednisone  FEV1 1.56(73%) ratio 85 =  No obst, including fef 25-75%, only abn is reduction in ERV -med calendar 11/17/2014 , 02/17/2015  - 12/2014 changed to physiologic steroids per Dr Buddy Duty  - spirometry 03/31/15 1.48 (62%) ratio 82  p am dulera and while symptomatic requesting saba   The proper method of use, as well as anticipated side effects, of a metered-dose inhaler are discussed and demonstrated to the patient. Improved effectiveness after extensive coaching during this visit to a level of approximately   75% so still has room for improvement    I continue to feel her problem is more restrictive from wt and back brace than obstructive with large pseudoasthma component that would be worse on higher doses of ICS so should be ok on dulera 100 2bid and continue to taper down the steroids   I had an extended discussion with the patient thru interpreter reviewing all relevant studies completed to date and  lasting 15 to 20 minutes of a 25 minute visit on the following ongoing concerns:     Each maintenance medication was reviewed in detail including most importantly the difference between maintenance and as needed and under what circumstances the prns are to be used. This was done in the context of a medication calendar review which provided the patient with a user-friendly unambiguous mechanism for medication administration and reconciliation and provides an action plan for all active problems. It is critical that this be shown to every doctor  for modification during the office visit if necessary so the patient can use it as a working document.

## 2015-04-02 NOTE — Assessment & Plan Note (Signed)
-   Trial off advair      09/05/2014 >>>  - trial off theoph 09/17/2014 and added flutter   This problem is improved, reminded re use of flutter as much as possible

## 2015-04-03 ENCOUNTER — Ambulatory Visit
Admission: RE | Admit: 2015-04-03 | Discharge: 2015-04-03 | Disposition: A | Discharge status: Home / Self Care | Payer: Medicaid Other | Source: Ambulatory Visit | Attending: Family Medicine | Admitting: Family Medicine

## 2015-04-03 ENCOUNTER — Other Ambulatory Visit: Payer: Self-pay | Admitting: *Deleted

## 2015-04-03 ENCOUNTER — Other Ambulatory Visit: Payer: Self-pay | Admitting: Family Medicine

## 2015-04-03 DIAGNOSIS — N644 Mastodynia: Secondary | ICD-10-CM

## 2015-04-03 MED ORDER — ACCU-CHEK SOFTCLIX LANCET DEV MISC: Status: AC

## 2015-04-06 ENCOUNTER — Other Ambulatory Visit: Payer: Self-pay | Admitting: Family Medicine

## 2015-04-06 ENCOUNTER — Encounter: Payer: Medicaid Other | Attending: Family Medicine | Admitting: Dietician

## 2015-04-06 ENCOUNTER — Encounter: Payer: Self-pay | Admitting: Dietician

## 2015-04-06 VITALS — Wt 178.0 lb

## 2015-04-06 DIAGNOSIS — E1165 Type 2 diabetes mellitus with hyperglycemia: Secondary | ICD-10-CM | POA: Diagnosis not present

## 2015-04-06 DIAGNOSIS — Z713 Dietary counseling and surveillance: Secondary | ICD-10-CM | POA: Insufficient documentation

## 2015-04-06 DIAGNOSIS — Z794 Long term (current) use of insulin: Secondary | ICD-10-CM | POA: Insufficient documentation

## 2015-04-06 DIAGNOSIS — IMO0002 Reserved for concepts with insufficient information to code with codable children: Secondary | ICD-10-CM

## 2015-04-06 MED ORDER — RANITIDINE HCL 150 MG PO TABS: 300.0000 mg | ORAL_TABLET | Freq: Every day | ORAL | Status: DC

## 2015-04-06 NOTE — Progress Notes (Signed)
Medical Nutrition Therapy:  Appt start time: 0800 end time:  0930.   Assessment: 12/29/14 Primary concerns today: Patient is here today with her husband and the interpretor, Graciela Namihira.  She would like to learn how to better control her diabetes..   She states that she has been diagnosed with type 2 diabetes about 4 years.  Her HgbA1C is 9.4% 12/15/14. Checks CGB 4 times per day, Before breakfast:  110-130, before lunch:  >200, before dinner:  >200, before bed:  >250-300.   As high as 350-400.  Patient states that she will be seeing Dr. Buddy Duty today.    Multiple verebral compression fractures noted.  States that she is on prednisone for asthma which has cauased osteoporosis.  Hx includes MRSA.    Patient lives with her husband.  Patient does the cooking and shopping.  They are from [place]  02/02/15: Patient is here today with her husband and interpretor Truitt Merle.  Weight was 182 lbs today, 183 lbs one month ago.  CGB's slightly lower that last month.  She is not getting the highs of 300-400 per patient.  Usually 130-150 before breakfast, 200 before lunch and dinner and 250-300 before bed.  States that she is having difficulty changing to low fat milk and unsweetened cereal.  She is drinking 1 cup juice with meds.  She reports a poor appetite for the past 2 weeks but does not know why.    03/07/15: Patient is here today with her husband and interpretor, Ellouise Newer.  Weight today is 178 lbs which has decreased from 182 lbs 1 month ago.  She has changed from sweetened cereal to unsweetened and bakes or grills rather than fries.  She states that she is mindful of her appetite.  She reports a lower dose of prednisone since last visit and noted that meal time insulin was increased.  HgbA1c has decreased from 9.4% (12/15/14) to 8.0% (03/27/15).    Preferred Learning Style:   No preference indicated   Learning Readiness:   Ready  MEDICATIONS: 60 units Novolog before meals. 90 units  Lantus each night, 500 mg Glucophage bid (she does not tolerate a higher dose.)  She also reports an allergy to the hydrocortisone and is now on prednisone.  See list for other.   DIETARY INTAKE: No red meat  24-hr recall:  B (9 AM): cereal (plain cheerios or frosted flakes)- 1 1/2-2 cups and whole milk 2 slices [employer] bread with cheese, ham and mayo, and banana and black coffee Snk ( AM): none  L (1:30 PM): rice and beans, chicken, veges Snk ( PM): none D (6-7 PM): rice and beans, veges Snk ( PM): if sugar is low- regular yogurt Beverages: water, black coffee  Usual physical activity: patient is wearing a full back brace.  She does not exercise.  Will walk if back and asthma OK.    Estimated energy needs: 1400 calories 158 g carbohydrates 105 g protein 39 g fat  Progress Towards Goal(s):  In progress.   Nutritional Diagnosis:  NB-1.1 Food and nutrition-related knowledge deficit As related to balance of carbohydrates, protein, and fat.  As evidenced by diet hx.    Intervention:  Nutrition counseling and diabetes education continued.   Discussed avoiding juice, decreasing fat content of milk, sugar content of cereal, and increase vegetable intake.  Reviewed My Plate and portion control.  Plan:  Aim for 3 Carb Choices per meal (45 grams) +/- 1 either way  Aim for 0-1 Carbs  per snack if hungry  Include protein in moderation with your meals and snacks Consider reading food labels for Total Carbohydrate and Fat Grams of foods  Plan, La meta es tres carbohidratos por comida (45 gramos)   Tomar 0-1 porcion de carbohidrato por  bocadillo ( snack ), si tiene hambre. Incluya proteina con moderacion con sus comidas y botanas. Considere leer las etiquetas de la comida para ver el total de carbohidratos y gramos de grasa de las comidas.   Teaching Method Utilized:  Visual Auditory Hands on  Handouts given during visit include:  Spanish my plate  Spanish meal plan card  Label  reading  Spanish Living with Diabetes  Barriers to learning/adherence to lifestyle change: language barrier  Demonstrated degree of understanding via:  Teach Back   Monitoring/Evaluation:  Dietary intake, exercise, label reading, and body weight prn.Marland Kitchen

## 2015-04-13 ENCOUNTER — Ambulatory Visit: Payer: Medicaid Other | Admitting: Podiatry

## 2015-04-14 ENCOUNTER — Ambulatory Visit (INDEPENDENT_AMBULATORY_CARE_PROVIDER_SITE_OTHER): Payer: Medicaid Other | Admitting: Podiatry

## 2015-04-14 DIAGNOSIS — M79676 Pain in unspecified toe(s): Secondary | ICD-10-CM | POA: Diagnosis not present

## 2015-04-14 DIAGNOSIS — B351 Tinea unguium: Secondary | ICD-10-CM | POA: Diagnosis not present

## 2015-04-14 DIAGNOSIS — L84 Corns and callosities: Secondary | ICD-10-CM

## 2015-04-14 NOTE — Patient Instructions (Signed)
Apply cocoa butter to dry cracked heels at night and cover with Saran wrap until healed Diabetes y cuidados del pie (Diabetes and Foot Care) La diabetes puede ser la causa de que el flujo sanguneo (circulacin) en las piernas y los pies sea deficiente. Debido a esto, la piel de los pies se torna ms delgada, se rompe con facilidad y se cura ms lentamente. La piel puede estar seca, despellejarse y Medical illustrator. Tambin pueden estar daados los nervios de las piernas y de los pies lo que provoca una disminucin de la sensibilidad. Es posible que no advierta heridas ms pequeas en los pies, que pueden causar infecciones graves. Cuidar sus pies es una de las cosas ms importantes que puede hacer por usted mismo.  INSTRUCCIONES PARA EL CUIDADO EN EL HOGAR  Use siempre calzado, an dentro de su casa. No camine descalzo. Caminar descalzo facilita que se lastime.  Controle sus pies diariamente para observar ampollas, cortes y enrojecimiento. Si no puede ver la planta del pie, use un espejo o pdale ayuda a Nurse, children's.  Lave sus pies con agua tibia (no use agua caliente) y un [place]. Seque bien sus pies, y la zona [employer] dedos dando [place], hasta que estn completamente secos. Noremoje los pies, ya que esto puede resecar la piel.  Aplique una locin hidratante o vaselina (que no contenga alcohol ni perfume) en los pies y en las uas secas y [place]. No aplique locin entre los dedos.  Recorte las uas en forma recta. No escarbe debajo de las uas o alrededor [employer]. Lime los bordes de las uas con una lima o esmeril.  No corte las durezas o callosidades, ni trate de quitarlas con medicamentos.  Use calcetines de algodn o medias [employer]. Asegrese de que no le [employer]. Nouse calcetines que le lleguen a las rodillas, ya que podran disminuir el flujo de sangre a las piernas.  Use zapatos de cuero que le queden bien y que sean acolchados. Para amoldar  los zapatos, clcelos slo algunas horas por da. Esto evitar lesiones en los pies. Revise siempre los zapatos antes de ponerlos para asegurarse de que no haya objetos en su interior.  No cruce las piernas. Esto puede disminuir el flujo de sangre a los pies.  Si algo le ha raspado, cortado o lastimado la piel de los pies, mantenga la piel de esa zona limpia y Ord. Debe higienizar estas zonas con agua y un jabn suave. No limpie la zona con agua oxigenada, alcohol ni yodo.  Cuando se quite un vendaje adhesivo, asegrese de no daar la piel.  Si tiene una herida, obsrvela varias veces por da para asegurarse de que se est curando.  No use bolsas de agua caliente ni almohadillas trmicas. Podran causar quemaduras. Si ha perdido la sensibilidad en los pies o las piernas, no sabr lo que le est sucediendo hasta que sea demasiado tarde.  Asegrese de que su mdico le haga un examen completo de los pies por lo menos una vez al ao, o con ms frecuencia si usted tiene [employer]. Informe todos los cortes, llagas o moretones a su mdico inmediatamente. SOLICITE ATENCIN MDICA SI:   Tiene una lesin que no se cura.  Tiene cortes o rajaduras en la piel.  Tiene una ua encarnada.  Nota una zona irritada en las piernas o los pies.  Siente una sensacin de ardor u hormigueo en las piernas o los pies.  Siente dolor o  calambres en las piernas o los pies.  Las piernas o los pies estn adormecidos.  Siente los pies siempre fros. SOLICITE ATENCIN MDICA DE INMEDIATO SI:   Presenta enrojecimiento, hinchazn o aumento del dolor en una herida.  Nota una lnea roja que sube por pierna.  Aparece pus en la herida.  Le sube la fiebre o segn lo que le indique el mdico.  Advierte un olor ftido que proviene de una lcera o una herida. Document Released: 08/29/2005 Document Revised: 05/01/2013 [facility] Patient Information 2015 [place]. This information is not intended to  replace advice given to you by your health care provider. Make sure you discuss any questions you have with your health care provider.

## 2015-04-14 NOTE — Progress Notes (Signed)
Patient ID: Raven Turner, female   DOB: [birth year], 44 y.o.   MRN: [id number]  Subjective: This patient presents for follow-up care requesting debridement of symptomatic second right toenail left hallux toenail  Objective: Spanish interpreter present treatment room Husband present treatment room  Fissuring heels bilaterally Hypertrophic second right toenail and left hallux with palpable tenderness  Assessment: Diabetic with diminished f protective sensation Symptomatic mycotic toenails 2 Keratoses heels 2 that are clinically not infected  Plan: Debrided toenails 2 mechanically and electrically without any bleeding Debrided keratoses 2 Advised to use Saran wrap over cocoa butter keratoses at bedtime until fissuring improves

## 2015-04-16 NOTE — Progress Notes (Signed)
Cardiology Office Note   Date:  04/17/2015   ID:  Renato Battles Aug 25, 1971, MRN [id number]  PCP:  Minerva Ends, MD  Cardiologist:   Dorris Carnes, MD   No chief complaint on file.  F/U of HTN, SOB and CP     History of Present Illness: Raven Turner is a 44 y.o. female with a history of HTN, distolic dysfunction, DM and COPD/asthma  and chest pressure (pleuritic)   I saw her in April and prior to that March On Verelan On last visist BP was up and I increased clonidine to 0/2 mg tid  Also used Lasix 60 2 to 3x per wk    Since seen breathing Ok  A little out of breath  Probme still with Cp    Not pleuritic    Time when feel sharp pain in when out of breath  Wants to burp No GERD    Gest SOB     Current Outpatient Prescriptions  Medication Sig Dispense Refill  . acetaminophen-codeine (TYLENOL #3) 300-30 MG per tablet Take 1-2 tablets by mouth at bedtime as needed for moderate pain. 60 tablet 2  . albuterol (PROAIR HFA) 108 (90 BASE) MCG/ACT inhaler 2 puffs every 4 hours as needed only  For wheezing/Shortness of breath (plan B) 1 Inhaler 5  . albuterol (PROVENTIL) (2.5 MG/3ML) 0.083% nebulizer solution Take 3 mLs (2.5 mg total) by nebulization every 4 (four) hours as needed for wheezing or shortness of breath (plan C). 75 mL 3  . aspirin 325 MG tablet Take 1 tablet (325 mg total) by mouth daily. 30 tablet 3  . atorvastatin (LIPITOR) 20 MG tablet Take 1 tablet (20 mg total) by mouth daily. 30 tablet 11  . Blood Glucose Monitoring Suppl (ACCU-CHEK AVIVA PLUS) W/DEVICE KIT Used as directed 1 kit 0  . cholecalciferol (VITAMIN D) 1000 UNITS tablet Take 1 tablet (1,000 Units total) by mouth daily. 30 tablet 11  . clonazePAM (KLONOPIN) 0.5 MG tablet Take 1 tablet (0.5 mg total) by mouth 2 (two) times daily as needed (trouble sleeping and anxiety). 30 tablet 2  . cloNIDine (CATAPRES) 0.2 MG tablet Take 1 tablet (0.2 mg total) by mouth 3 (three) times daily. 90 tablet 11  .  dextromethorphan-guaiFENesin (MUCINEX DM) 30-600 MG per 12 hr tablet Take 1 tablet by mouth 2 (two) times daily as needed (cough, congestion. with flutter valve).     . ferrous sulfate 325 (65 FE) MG tablet Take 1 tablet (325 mg total) by mouth daily with breakfast. 30 tablet 3  . FOLIC ACID PO Take 1 tablet by mouth every morning.    . furosemide (LASIX) 40 MG tablet Take 1 tablet (40 mg total) by mouth every morning. 30 tablet 2  . gabapentin (NEURONTIN) 400 MG capsule Take 2 capsules (800 mg total) by mouth 2 (two) times daily. 120 capsule 3  . glucose blood (RELION GLUCOSE TEST STRIPS) test strip 1 each by Other route 4 (four) times daily. ICD code E11.65 400 each 3  . insulin aspart (NOVOLOG) 100 UNIT/ML injection Inject 60 Units into the skin 3 (three) times daily before meals. 3 vial 3  . Insulin Glargine (LANTUS SOLOSTAR) 100 UNIT/ML Solostar Pen Inject 90 Units into the skin at bedtime. 5 pen 11  . Insulin Pen Needle (B-D ULTRAFINE III SHORT PEN) 31G X 8 MM MISC 1 application by Does not apply route daily. 100 each 3  . Insulin Syringe-Needle U-100 (BD INSULIN SYRINGE ULTRAFINE)  31G X 15/64" 1 ML MISC 1 each by Does not apply route 3 (three) times daily. 300 each 3  . Lancet Devices (ACCU-CHEK SOFTCLIX) lancets Use as instructed 1 each 0  . losartan (COZAAR) 100 MG tablet Take 1 tablet (100 mg total) by mouth daily. 30 tablet 3  . metFORMIN (GLUCOPHAGE) 500 MG tablet Take 1 tablet (500 mg total) by mouth 2 (two) times daily with a meal. 60 tablet 11  . methocarbamol (ROBAXIN) 750 MG tablet Take 1 tablet (750 mg total) by mouth every 8 (eight) hours as needed for muscle spasms. 90 tablet 2  . mometasone-formoterol (DULERA) 100-5 MCG/ACT AERO Inhale 2 puffs into the lungs 2 (two) times daily. Take 2 puffs first thing in am and then another 2 puffs about 12 hours later. 13 g 6  . montelukast (SINGULAIR) 10 MG tablet Take 1 tablet (10 mg total) by mouth at bedtime. 30 tablet 11  . omeprazole  (PRILOSEC) 20 MG capsule Take 1 capsule (20 mg total) by mouth daily. 90 capsule 1  . Oyster Shell Calcium 500 MG TABS Take 0.4 tablets (500 mg total) by mouth 2 (two) times daily after a meal. 60 tablet 11  . PARoxetine (PAXIL) 20 MG tablet Take 20 mg by mouth every morning.     . predniSONE (DELTASONE) 5 MG tablet Take 1 tablet (5 mg total) by mouth daily with breakfast.    . ranitidine (ZANTAC) 150 MG tablet Take 2 tablets (300 mg total) by mouth at bedtime. 60 tablet 5  . ReliOn Ultra Thin Lancets MISC 1 each by Does not apply route 4 (four) times daily. ICD code E11.65 400 each 3  . Respiratory Therapy Supplies (FLUTTER) DEVI Use as directed 1 each 0  . terbinafine (LAMISIL AT) 1 % cream Apply 1 application topically 2 (two) times daily. 30 g 0  . verapamil (VERELAN PM) 360 MG 24 hr capsule Take 1 capsule (360 mg total) by mouth every morning. 30 capsule 11  . zoledronic acid (RECLAST) 5 MG/100ML SOLN injection Inject 100 mLs (5 mg total) into the vein once. 100 mL 0   No current facility-administered medications for this visit.    Allergies:   Shellfish allergy; Septra; and Hydrocortisone   Past Medical History  Diagnosis Date  . Asthma   . Cushing syndrome 2013   . Depression   . Anemia   . Osteopenia 11/19/2013    Dx with osteopenia in lumbar vertebra with partial compression of L1  . Diabetes 2013   . Diabetes mellitus with neuropathy   . HTN (hypertension) 2005   . COPD (chronic obstructive pulmonary disease) 2013  . Compression fracture     Past Surgical History  Procedure Laterality Date  . Cataract extraction Bilateral 2014  . Vaginal hysterectomy       Social History:  The patient  reports that she has never smoked. She has never used smokeless tobacco. She reports that she does not drink alcohol or use illicit drugs.   Family History:  The patient's family history includes Asthma in her father; Diabetes in her mother; Stroke in her father.    ROS:  Please see  the history of present illness. All other systems are reviewed and  Negative to the above problem except as noted.    PHYSICAL EXAM: VS:  BP 116/78 mmHg  Pulse 85  Ht 4' 11"  (1.499 m)  Wt 178 lb 12.8 oz (81.103 kg)  BMI 36.09 kg/m2  SpO2 97%  GEN: Morbidly obese female  in no acute distress HEENT: normal Neck: no JVD, carotid bruits, or masses Cardiac: RRR; no murmurs, rubs, or gallops,Tr edema  Respiratory:  Moving air  Minima lwheeze normal work of breathing GI: soft, nontender, nondistended, + BS  No hepatomegaly  MS: no deformity Moving all extremities   Skin: warm and dry, no rash Neuro:  Strength and sensation are intact Psych: euthymic mood, full affect   EKG:  EKG is not  ordered today.   Lipid Panel    Component Value Date/Time   CHOL 163 12/15/2014 1051   TRIG 192.0* 12/15/2014 1051   HDL 45.90 12/15/2014 1051   CHOLHDL 4 12/15/2014 1051   VLDL 38.4 12/15/2014 1051   LDLCALC 79 12/15/2014 1051      Wt Readings from Last 3 Encounters:  04/17/15 178 lb 12.8 oz (81.103 kg)  04/06/15 178 lb (80.74 kg)  03/31/15 180 lb 12.8 oz (82.01 kg)      ASSESSMENT AND PLAN:  1  HTN  Better  HR better   Keep on same meds    2.  CP  Different from previous    On exam today she is tender to palpation in L chest  Similar to needles she feels  She does have back issues  Stretching is diffiicult.  Cant get PT  With SOB and indigestion will set up for dobutamine myoview given severe asthma  3.  HL  Keep on sstatin   F/U in December    Signed, Dorris Carnes, MD  04/17/2015 9:38 AM    [address], [place], [place]  [address] Phone: [phone]; Fax: [phone]

## 2015-04-17 ENCOUNTER — Ambulatory Visit (INDEPENDENT_AMBULATORY_CARE_PROVIDER_SITE_OTHER): Payer: Medicaid Other | Admitting: Internal Medicine

## 2015-04-17 ENCOUNTER — Encounter: Payer: Self-pay | Admitting: Internal Medicine

## 2015-04-17 VITALS — BP 116/78 | HR 85 | Ht 59.0 in | Wt 178.8 lb

## 2015-04-17 DIAGNOSIS — R0602 Shortness of breath: Secondary | ICD-10-CM | POA: Diagnosis not present

## 2015-04-17 NOTE — Patient Instructions (Signed)
Medication Instructions: - no changes  Labwork: - none  Procedures/Testing: - Your physician has requested that you have a dobutamine myoview. For furth information please visit [email or web address]. Please follow instruction sheet, as given.  Follow-Up: - Your physician recommends that you schedule a follow-up appointment in: December with Dr. Harrington Challenger.  Any Additional Special Instructions Will Be Listed Below (If Applicable). - none

## 2015-04-20 ENCOUNTER — Telehealth (HOSPITAL_COMMUNITY): Payer: Self-pay

## 2015-04-20 NOTE — Telephone Encounter (Signed)
Encounter complete. 

## 2015-04-22 ENCOUNTER — Ambulatory Visit (HOSPITAL_COMMUNITY): Payer: Medicaid Other | Attending: Cardiology

## 2015-04-22 DIAGNOSIS — R0602 Shortness of breath: Secondary | ICD-10-CM | POA: Diagnosis not present

## 2015-04-22 MED ORDER — TECHNETIUM TC 99M SESTAMIBI GENERIC - CARDIOLITE
31.6000 | Freq: Once | INTRAVENOUS | Status: AC | PRN
  Administered 2015-04-22: 32 via INTRAVENOUS

## 2015-04-22 MED ORDER — DOBUTAMINE INFUSION FOR EP/ECHO/NUC (1000 MCG/ML)
30.0000 ug/kg/min | INTRAVENOUS | Status: AC
  Administered 2015-04-22: 30 ug/kg/min via INTRAVENOUS

## 2015-04-23 ENCOUNTER — Ambulatory Visit (HOSPITAL_COMMUNITY): Payer: Medicaid Other | Attending: Internal Medicine

## 2015-04-23 LAB — MYOCARDIAL PERFUSION IMAGING
LV dias vol: 138 mL
LV sys vol: 62 mL
Peak HR: 166 {beats}/min
RATE: 0.45
Rest HR: 74 {beats}/min
SDS: 1
SRS: 0
SSS: 1
TID: 1.19

## 2015-04-23 MED ORDER — TECHNETIUM TC 99M SESTAMIBI GENERIC - CARDIOLITE
32.6000 | Freq: Once | INTRAVENOUS | Status: AC | PRN
  Administered 2015-04-23: 33 via INTRAVENOUS

## 2015-04-27 ENCOUNTER — Other Ambulatory Visit (HOSPITAL_COMMUNITY)
Admission: RE | Admit: 2015-04-27 | Discharge: 2015-04-27 | Disposition: A | Discharge status: Home / Self Care | Payer: Medicaid Other | Source: Ambulatory Visit | Attending: Family Medicine | Admitting: Family Medicine

## 2015-04-27 ENCOUNTER — Ambulatory Visit: Payer: Medicaid Other | Attending: Family Medicine | Admitting: Family Medicine

## 2015-04-27 ENCOUNTER — Encounter: Payer: Self-pay | Admitting: Family Medicine

## 2015-04-27 VITALS — BP 142/83 | HR 101 | Temp 98.0°F | Resp 16 | Ht <= 58 in | Wt 178.2 lb

## 2015-04-27 DIAGNOSIS — E1165 Type 2 diabetes mellitus with hyperglycemia: Secondary | ICD-10-CM

## 2015-04-27 DIAGNOSIS — Z01419 Encounter for gynecological examination (general) (routine) without abnormal findings: Secondary | ICD-10-CM | POA: Insufficient documentation

## 2015-04-27 DIAGNOSIS — IMO0002 Reserved for concepts with insufficient information to code with codable children: Secondary | ICD-10-CM

## 2015-04-27 DIAGNOSIS — Z124 Encounter for screening for malignant neoplasm of cervix: Secondary | ICD-10-CM | POA: Diagnosis not present

## 2015-04-27 DIAGNOSIS — N760 Acute vaginitis: Secondary | ICD-10-CM

## 2015-04-27 DIAGNOSIS — Z01411 Encounter for gynecological examination (general) (routine) with abnormal findings: Secondary | ICD-10-CM | POA: Diagnosis not present

## 2015-04-27 DIAGNOSIS — Z113 Encounter for screening for infections with a predominantly sexual mode of transmission: Secondary | ICD-10-CM | POA: Insufficient documentation

## 2015-04-27 DIAGNOSIS — B9689 Other specified bacterial agents as the cause of diseases classified elsewhere: Secondary | ICD-10-CM

## 2015-04-27 DIAGNOSIS — A499 Bacterial infection, unspecified: Secondary | ICD-10-CM

## 2015-04-27 DIAGNOSIS — H578 Other specified disorders of eye and adnexa: Secondary | ICD-10-CM | POA: Insufficient documentation

## 2015-04-27 DIAGNOSIS — Z131 Encounter for screening for diabetes mellitus: Secondary | ICD-10-CM

## 2015-04-27 DIAGNOSIS — Z1151 Encounter for screening for human papillomavirus (HPV): Secondary | ICD-10-CM | POA: Insufficient documentation

## 2015-04-27 DIAGNOSIS — H5789 Other specified disorders of eye and adnexa: Secondary | ICD-10-CM | POA: Insufficient documentation

## 2015-04-27 LAB — GLUCOSE, POCT (MANUAL RESULT ENTRY): POC Glucose: 110 mg/dl — AB (ref 70–99)

## 2015-04-27 MED ORDER — OMEPRAZOLE 20 MG PO CPDR: 20.0000 mg | DELAYED_RELEASE_CAPSULE | Freq: Every day | ORAL | Status: DC

## 2015-04-27 MED ORDER — INSULIN ASPART 100 UNIT/ML ~~LOC~~ SOLN: 70.0000 [IU] | Freq: Three times a day (TID) | SUBCUTANEOUS | Status: DC

## 2015-04-27 MED ORDER — LOSARTAN POTASSIUM 100 MG PO TABS: 100.0000 mg | ORAL_TABLET | Freq: Every day | ORAL | Status: DC

## 2015-04-27 MED ORDER — POLYETHYL GLYCOL-PROPYL GLYCOL 0.4-0.3 % OP SOLN: 1.0000 [drp] | OPHTHALMIC | Status: DC | PRN

## 2015-04-27 NOTE — Assessment & Plan Note (Addendum)
A: eye irritation w/o evidence of infection P: systane drops for L eye irritation

## 2015-04-27 NOTE — Progress Notes (Signed)
Patient here for pap only. 

## 2015-04-27 NOTE — Assessment & Plan Note (Signed)
Pap done today  

## 2015-04-27 NOTE — Patient Instructions (Addendum)
Mrs. Raven Turner,  Pap done today  systane drops for L eye irritation  Refilled losartan and omeprazole  Pain management referral sent, this is the note from Alinda Sierras: Sent Referral to [facility] Ph. # [phone] .They will contact the patient to schedule an appointment.  F/u in 4 weeks with RN for flu shot  F/u with me in 2 months for diabetes   Dr. Adrian Blackwater

## 2015-04-27 NOTE — Progress Notes (Signed)
   Subjective:    Patient ID: Raven Turner, female    DOB: [birth year], 44 y.o.   MRN: [id number] CC: pap  HPI 44 yo F presents for pap smear Spanish interpreter present   1. Pap: no vaginal bleeding, pain or discharge.   2. Eye irritation: started 4 days ago when patient had a stress test. No new medications other than IV dobutamine and IV 32.6 mCi Tc55m Sestamibi given during nuclear stress test.  Eye is burning. No drainage. No trauma. L eye mostly.    Social History  Substance Use Topics  . Smoking status: Never Smoker   . Smokeless tobacco: Never Used  . Alcohol Use: No   Review of Systems  Constitutional: Negative for fever and chills.  Eyes: Negative for visual disturbance.  Respiratory: Negative for shortness of breath.   Cardiovascular: Negative for chest pain.  Gastrointestinal: Negative for abdominal pain and blood in stool.  Genitourinary: Negative for vaginal bleeding, vaginal discharge, vaginal pain, menstrual problem and pelvic pain.  Musculoskeletal: Negative for back pain and arthralgias.  Skin: Negative for rash.  Allergic/Immunologic: Negative for immunocompromised state.  Hematological: Negative for adenopathy. Does not bruise/bleed easily.  Psychiatric/Behavioral: Negative for suicidal ideas and dysphoric mood.       Objective:   Physical Exam  Constitutional: She is oriented to person, place, and time. She appears well-developed and well-nourished. No distress.  Eyes: EOM are normal. Pupils are equal, round, and reactive to light. Right conjunctiva is injected. Right conjunctiva has no hemorrhage. Left conjunctiva is injected. Left conjunctiva has no hemorrhage.  Pulmonary/Chest: Effort normal. Right breast exhibits skin change. Right breast exhibits no inverted nipple, no mass, no nipple discharge and no tenderness. Left breast exhibits skin change. Left breast exhibits no inverted nipple, no mass, no nipple discharge and no tenderness.     Genitourinary: Vagina normal and uterus normal. Pelvic exam was performed with patient prone. There is no rash, tenderness or lesion on the right labia. There is no rash, tenderness or lesion on the left labia. Cervix exhibits no motion tenderness, no discharge and no friability.  Musculoskeletal: She exhibits edema.  Lymphadenopathy:       Right: No inguinal adenopathy present.       Left: No inguinal adenopathy present.  Neurological: She is alert and oriented to person, place, and time.  Skin: Skin is warm and dry. No rash noted.  Psychiatric: She has a normal mood and affect.          Assessment & Plan:

## 2015-04-29 ENCOUNTER — Telehealth: Payer: Self-pay | Admitting: *Deleted

## 2015-04-29 DIAGNOSIS — N760 Acute vaginitis: Secondary | ICD-10-CM | POA: Insufficient documentation

## 2015-04-29 DIAGNOSIS — B9689 Other specified bacterial agents as the cause of diseases classified elsewhere: Secondary | ICD-10-CM | POA: Insufficient documentation

## 2015-04-29 LAB — CERVICOVAGINAL ANCILLARY ONLY
Chlamydia: NEGATIVE
Neisseria Gonorrhea: NEGATIVE
Wet Prep (BD Affirm): POSITIVE — AB

## 2015-04-29 LAB — CYTOLOGY - PAP

## 2015-04-29 MED ORDER — METRONIDAZOLE 500 MG PO TABS: 500.0000 mg | ORAL_TABLET | Freq: Two times a day (BID) | ORAL | Status: DC

## 2015-04-29 MED ORDER — FLUCONAZOLE 150 MG PO TABS: 150.0000 mg | ORAL_TABLET | Freq: Once | ORAL | Status: DC

## 2015-04-29 NOTE — Telephone Encounter (Signed)
Pt here in our clinic requesting Lab results Results given. Notified Rx send to [facility] to start Flagyl after last dose followed by Diflucan Pt verbalize understanding  Information given in Spanish

## 2015-04-29 NOTE — Addendum Note (Signed)
Addended by: Boykin Nearing on: 04/29/2015 11:19 AM   Modules accepted: Orders

## 2015-04-29 NOTE — Telephone Encounter (Signed)
-----   Message from Boykin Nearing, MD sent at 04/29/2015 11:18 AM EDT ----- BV on wet prep sent in flagyl followed by diflucan Gc/chlam negative

## 2015-05-04 ENCOUNTER — Telehealth: Payer: Self-pay

## 2015-05-04 NOTE — Telephone Encounter (Signed)
Pt does not speak english and rqst I speak with her husband [place]. Pt husband aware of stress test results with verbal understanding. Stress test is normal. Symptoms do not appear to be due to a blood flow problem from heart.

## 2015-05-04 NOTE — Telephone Encounter (Signed)
-----   Message from Dorris Carnes V, MD sent at 05/03/2015  4:40 PM EDT ----- Stress test is normal.  Symptoms do not appear to be due to a blood flow problem from heart.

## 2015-05-12 ENCOUNTER — Encounter: Payer: Self-pay | Admitting: Adult Health

## 2015-05-12 ENCOUNTER — Ambulatory Visit (INDEPENDENT_AMBULATORY_CARE_PROVIDER_SITE_OTHER): Payer: Medicaid Other | Admitting: Adult Health

## 2015-05-12 ENCOUNTER — Other Ambulatory Visit: Payer: Self-pay | Admitting: *Deleted

## 2015-05-12 VITALS — BP 136/84 | HR 82 | Temp 98.2°F | Ht <= 58 in | Wt 180.0 lb

## 2015-05-12 DIAGNOSIS — R05 Cough: Secondary | ICD-10-CM | POA: Diagnosis not present

## 2015-05-12 DIAGNOSIS — R058 Other specified cough: Secondary | ICD-10-CM

## 2015-05-12 DIAGNOSIS — Z79899 Other long term (current) drug therapy: Secondary | ICD-10-CM | POA: Insufficient documentation

## 2015-05-12 DIAGNOSIS — J45998 Other asthma: Secondary | ICD-10-CM | POA: Diagnosis not present

## 2015-05-12 HISTORY — DX: Other long term (current) drug therapy: Z79.899

## 2015-05-12 MED ORDER — GABAPENTIN 400 MG PO CAPS
800.0000 mg | ORAL_CAPSULE | Freq: Two times a day (BID) | ORAL | Status: DC
Start: 2015-05-12 — End: 2015-09-01

## 2015-05-12 NOTE — Assessment & Plan Note (Signed)
Improved control  On Dulera . No flare with tapering dose of prednisone  Presently on prednisone 61m daily , if doing well on return consider if pt would be able to taper to off slowly  Patient's medications were reviewed today and patient education was given. Computerized medication calendar was adjusted/completed   Plan  Cont currrent regimen

## 2015-05-12 NOTE — Assessment & Plan Note (Signed)
Patient's medications were reviewed today and patient education was given. Computerized medication calendar was adjusted/completed  

## 2015-05-12 NOTE — Progress Notes (Signed)
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  Onset was childhood but continued to have "good days and bad days" until 2010 and no good days since then even p pred started and no better since arrived in [place] summer 2015 with no evidence of any airflow obst by pfts 11/03/14    History of Present Illness  09/04/2014 1st [facility] office visit/ Wert   Chief Complaint  Patient presents with  . Advice Only    Referred for chronic bronchitis. Recently moved from [place].   Every day's about the same since 2012 admitted to [place] every month but not since arrival in [place]/   maintaining on prednisone ? Dose plus theoph/ brethine/ advair 500 and nebs "all day long"  But interestingly less symptoms nocturnally. Cough is mostly dry assoc with hoarseness  rec Stop the advair, the brethine  and the theophylline Start dulera 100 Take 2 puffs first thing in am and then another 2 puffs about 12 hours later.  Continue prilosec 20 mg Take 30- 60 min before your first and last meals of the day  GERD diet  Only use your albuterol nebulizer  If needed  If you find you don't need the nebulizer as much, then we can start reducing your prednisone by 10 mg a week   Admit date: 09/07/2014 Discharge date: 09/09/2014  Discharge Diagnoses:  Active Hospital Problems   Diagnosis Date Noted  . Pneumonia 09/07/2014  . Morbid obesity 09/08/2014  . Non-English speaking patient 09/08/2014  . Diabetes mellitus type 2, uncontrolled 09/07/2014  . Asthma exacerbation 09/07/2014  . Hypertension 09/07/2014  . HTN (hypertension)      Patient is a 44 year old Hispanic female, non-English speaking with past history  of asthma on chronic prednisone, diabetes mellitus and hypertension who was admitted to the hospitalist service on the night of 12/27 for several days of persistent productive cough, wheezing and shortness of breath. Patient was noted in the emergency room to have a mild leukocytosis as well as an early developing left upper lobe pneumonia. Patient started on nebulizers, oxygen and antibiotics.  Hospital Course:  Principal Problem:  Acute respiratory failure/Asthma exacerbation/Pneumonia: Continue IV Levaquin, add incentive spirometer, changed by mouth prednisone to IV Solu-Medrol given significant wheezing. Patient and mildly tachycardic so adding back oxygen. Added nebulizers. By 12/29, patient looked to be significantly better and off of oxygen. With ambulation, she contraction saturations at 96% although she did have some significant tachycardia. Part of this looked to be from pain from her hip which is chronic. There is also some question based off of her pulmonologist note that the way she takes her inhalers to be causing her to do more harm than good. As per their recommendation, theophylline discontinued. Patient will continue on 5 more days of by mouth Levaquin to complete a seven-day course for this pneumonia Active Problems:  HTN (hypertension): Continue home meds  Diabetes mellitus type 2, uncontrolled with peripheral neuropathy: Continue Lantus, scheduled NovoLog plus sliding scale plus Neurontin. With sodium Medrol, CBGs even more elevated and patient required increased dose of Lantus and sliding scale  Hypertension: Continue home meds  Morbid obesity: Patient meets criteria with BMI greater  than 40.      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 HFU    Pt states that her breathing has improved some, but not back to baseline. SHe is still coughing up yellow sputum.    using neb saba avg twice  daily, feels theoph helps with "congestion" but able to do adls ok   >d/c theophylline/breathine   09/26/2014  Follow up and Med Review (Asthma/Steroid dependent)  Interpreter present for today's visit  patient does understand limited English, husband reads and understands more English Patient reports that she's had severe asthma as a child and has been on and off steroids dose of her life She is currently on prednisone 40 mg. Patient has been in the [place] for around 6-7 months Previously treated in [place]. She says she's had several hospitalizations. Most recent hospitalization was 2 weeks ago, she is starting to feel somewhat improved. Patient was seen in office last week. Her theophylline and Brethine was stopped. Patient has brought all of her medications in today for review We reviewed all her medications organize them into a medication count with patient education Patient is on multiple medications from [place] and locally. She is taking both budesonide and Dulera. Patient has had multiple steroid related complications including cataracts, compression fx and  Diabetes. She is currently being evaluated for right arm swelling. She has MRI and 2-D echo pending. She was treated with doxycycline. She denies any chest pain, orthopnea, PND, leg swelling, abdominal pain, nausea, vomiting. Does not have any PFTs on record. rec Stop Budesonide neb  Decrease Prednisone 5m 2 tabs alternating with 1.5 tabs for 2 weeks then 1.5 tabs daily for 2 weeks and then 1 tab daily and hold at this dose.  Follow med calendar closely and bring to each visit.     11/03/2014 f/u ov/Wert re: dtca asthma/ no rx today/ pred still at 30 mg  Chief Complaint  Patient presents with  . Follow-up    PFT done today. Pt states that her cough has resolved. Her breathing is some better. No new co's today.   still badly misunderstanding how to take her meds / unable to doing meaningful or accurate med rec    >>decrease pred 20     11/17/2014 Follow up : Difficult to control Asthma-steroid dependent /NP ov Interpreter is present for today's visit.  We reviewed all her medications organize them into a medication calendar with patient education It appears the patient is taking her medications correctly. Last visit. Patient's prednisone was decreased to 20 mg daily She has not had a flare of cough, wheezing or shortness of breath since last visit. rec In 2 weeks on 12/01/14 decrease Prednisone 261malternating 104maily until seen back in offce.  Follow med calendar closely and bring to each visit.  Please contact office for sooner follow up if symptoms do not improve or worsen or seek emergency care     12/15/2014 f/u ov/Wert re: chronic asthma/ pseudoasthma / [place]  Chief Complaint  Patient presents with  . Follow-up    Pt states still feeling SOB especially with movement. Denies fever, cough, or chest tightness   on prednisone 20 mg  Plus neb 3 tid including am of ov due to variable sob day >>noct  rec Plan A = automatic = dulera 100 Take 2 puffs first thing in am and then  another 2 puffs about 12 hours later.  Work on inhaler technique:  relax and gently blow all the way out then take a nice smooth deep breath back in, triggering the inhaler at same time you start breathing in.  Hold for up to 5 seconds if you can.  Rinse and gargle with water when done Plan B = backup Only use your albuterol [place] as a rescue medication  Plan C = neb albuterol, only use it if try B first and it doesn't work Continue  prednisone to 20 mg daily      01/12/2015 f/u ov/Wert re: asthma vs pseudoasthma/just on physiologic steroids per Dr Buddy Duty plus dulera 100 2bid Chief Complaint  Patient presents with  . Follow-up    Pt states that her breathing is doing well. She is using albuterol inhaler and neb both 2 x daily on average.     More day than noct events / having choking  episodes more than wheezing  Concern re tachycardia by primary care so metaprolol rec but she never started it.  >>no changes   02/17/2015 NP Follow up and Med review : asthma vs pseudoasthma/just on physilogic steroids per Dr Buddy Duty plus dulera 100 2bid Patient is accompanied by an interpreter Patient returns for a 4 week follow-up and medication review We reviewed all her medications organize them into a medication count with patient education Appears to be taking her medications correctly Patient says overall her breathing has been stable. She had a couple nights where she has some intermittent wheezing but this has improved. She appears the been changed to hydrocortisone. However, was unable to tolerate this and is now back on prednisone 10 mg daily. rec  Follow med calendar closely and bring to each visit.    03/31/2015 f/u ov/Wert re: dtc asthma vs vcd / has med calendar but not using it correctly / maint on dulera 100 2bid / 5 mg daily pred Chief Complaint  Patient presents with  . Follow-up    asthma: tightness in chest.  some cough, but not much. hot weather making SOB worse.  sore throat, both ears hurt.   last night took hfa sab and 2 h later the neb saba  On avg taking saba hfa  sev times a day and neb sev times a day also  Last rx 4 h prior to OV  and feels she could use it again now for sob and chest tight >>no changes   05/12/2015 Follow up : Severe Asthma -steroid dependent Pt returns for 1 month follow up .  Says she is doing well overall .  Feels better when temp are cooler  Cough, congestion and wheezing are less.  Remains on prednisone 74m daily  Without flare of wheezing /dyspea.  Patient has had multiple steroid related complications including cataracts, compression fx and  Diabetes. Over last 6 months has been tapered slowly down from high dose steroids to prednisone 558mdaily  Remains on prednisone 40m240maily  Without flare of wheezing /dyspea.  Previously on  Advair, brethine and theophylline which were stopped over 6 months ago, now [facility] good control and no ER visits over last 6 months .  We reviewed all her meds and updated her very complex medication regimen with pt education  She says she understands and reads english with family at home that speaks/reads english.  Has medicaid for insurance which meds are affordable. She is married with no kids.  Denies chest pain, orthopnea, edema or  fever.     Current Medications, Allergies, Complete Past Medical History, Past Surgical History, Family History, and Social History were reviewed in [employer] record.  ROS  The following are not active complaints unless bolded sore throat, dysphagia, dental problems, itching, sneezing,  nasal congestion or excess/ purulent secretions, ear ache,   fever, chills, sweats, unintended wt loss, classically pleuritic or exertional cp, hemoptysis,  orthopnea pnd or leg swelling, presyncope, palpitations, abdominal pain, anorexia, nausea, vomiting, diarrhea  or change in bowel or bladder habits, change in stools or urine, dysuria,hematuria,  rash, arthralgias, visual complaints, headache, numbness, weakness or ataxia or problems with walking or coordination,  change in mood/affect or memory.           Objective:   Physical Exam   Mildly hoarse  very cushingnoid amb hispanic female nad   01/12/2015  186 > 177 02/17/2015 >  03/31/2015  181 >180 >180 05/12/2015     Vital signs reviewed       HEENT: nl dentition, turbinates, and orophanx. Nl external ear canals without cough reflex   NECK :  without JVD/Nodes/TM/ nl carotid upstrokes bilaterally   LUNGS: no acc muscle use, clear to A and P bilaterally without cough on insp or exp maneuvers -   CV:  RRR  no s3 or murmur or increase in P2, no edema   ABD:  soft and nontender with nl excursion in the supine position. No bruits or organomegaly, bowel sounds nl  MS:  warm without  deformities, calf tenderness, cyanosis or clubbing Back brace  SKIN: warm and dry without lesions              Assessment & Plan:

## 2015-05-12 NOTE — Assessment & Plan Note (Signed)
Improved with regimen aimed at trigger prevention   Plan  Cont current regimen

## 2015-05-12 NOTE — Patient Instructions (Signed)
Follow med calendar closely and bring to each visit.  Follow up Dr. Wert  In 2-3 months and As needed   Please contact office for sooner follow up if symptoms do not improve or worsen or seek emergency care   

## 2015-05-12 NOTE — Progress Notes (Signed)
Chart and office note reviewed in detail - overall doing much better on much lower pred rx > agree with a/p as outlined

## 2015-05-12 NOTE — Addendum Note (Signed)
Addended by: Osa Craver on: 05/12/2015 01:56 PM   Modules accepted: Orders, Medications

## 2015-05-13 ENCOUNTER — Emergency Department (INDEPENDENT_AMBULATORY_CARE_PROVIDER_SITE_OTHER): Payer: Medicaid Other

## 2015-05-13 ENCOUNTER — Emergency Department (HOSPITAL_COMMUNITY)
Admission: EM | Admit: 2015-05-13 | Discharge: 2015-05-13 | Disposition: A | Discharge status: Home / Self Care | Payer: Medicaid Other | Source: Home / Self Care | Attending: Family Medicine | Admitting: Family Medicine

## 2015-05-13 ENCOUNTER — Encounter (HOSPITAL_COMMUNITY): Payer: Self-pay | Admitting: Emergency Medicine

## 2015-05-13 DIAGNOSIS — M25512 Pain in left shoulder: Secondary | ICD-10-CM | POA: Diagnosis not present

## 2015-05-13 NOTE — ED Notes (Signed)
The patient presented to the [facility] with a complaint of pain to the left arm and shoulder secondary to a fall that occurred yesterday. The patient described the pain as a constant dull pain as 7/10. The patient did have some obvious edema to the clavicular area.

## 2015-05-13 NOTE — Discharge Instructions (Signed)
Your xray was normal. Please take ibuprofen every 4-6 hours for swelling and pain. Icing the area should help as well. When you're feeling better in a few days light range of motion will help to prevent the shoulder joint from getting stiff on you.  Please come back to see [place] if you're not better in one week or if the pain worsens.

## 2015-05-13 NOTE — ED Provider Notes (Signed)
CSN: [id number]     Arrival date & time 05/13/15  1655 History   First MD Initiated Contact with Patient 05/13/15 1742     Chief Complaint  Patient presents with  . Fall  . Arm Injury   HPI  73 yof presents after falling onto left arm.   Sitting on couch yest fell off onto left hand/elbow. Pain left clavicle, left shoulder since fall. Able to use left arm normally.   Past Medical History  Diagnosis Date  . Asthma   . Cushing syndrome 2013   . Depression   . Anemia   . Osteopenia 11/19/2013    Dx with osteopenia in lumbar vertebra with partial compression of L1  . Diabetes 2013   . Diabetes mellitus with neuropathy   . HTN (hypertension) 2005   . COPD (chronic obstructive pulmonary disease) 2013  . Compression fracture    Past Surgical History  Procedure Laterality Date  . Cataract extraction Bilateral 2014  . Vaginal hysterectomy     Family History  Problem Relation Age of Onset  . Diabetes Mother   . Stroke Father   . Asthma Father    Social History  Substance Use Topics  . Smoking status: Never Smoker   . Smokeless tobacco: Never Used  . Alcohol Use: No   OB History    No data available     Review of Systems No fevers, chills.  Allergies  Shellfish allergy; Septra; and Hydrocortisone  Home Medications   Prior to Admission medications   Medication Sig Start Date End Date Taking? Authorizing Provider  acetaminophen-codeine (TYLENOL #3) 300-30 MG per tablet Take 1-2 tablets by mouth at bedtime as needed for moderate pain. Patient taking differently: Take 1-2 tablets by mouth at bedtime as needed (cough and severe pain).  03/27/15   Josalyn Funches, MD  albuterol (PROAIR HFA) 108 (90 BASE) MCG/ACT inhaler 2 puffs every 4 hours as needed only  For wheezing/Shortness of breath (plan B) Patient taking differently: 2 puffs every 4 hours as needed only  For wheezing/Shortness of breath )(Plan B)) 03/27/15   Josalyn Funches, MD  albuterol (PROVENTIL) (2.5 MG/3ML)  0.083% nebulizer solution Take 3 mLs (2.5 mg total) by nebulization every 4 (four) hours as needed for wheezing or shortness of breath (plan C). Patient taking differently: Take 2.5 mg by nebulization every 4 (four) hours as needed for wheezing or shortness of breath (((Plan C))).  03/27/15   Boykin Nearing, MD  aspirin 325 MG tablet Take 1 tablet (325 mg total) by mouth daily. Patient taking differently: Take 325 mg by mouth every morning.  03/27/15   Josalyn Funches, MD  atorvastatin (LIPITOR) 20 MG tablet Take 1 tablet (20 mg total) by mouth daily. Patient taking differently: Take 20 mg by mouth at bedtime.  03/27/15   Josalyn Funches, MD  Blood Glucose Monitoring Suppl (ACCU-CHEK AVIVA PLUS) W/DEVICE KIT Used as directed 11/05/14   Boykin Nearing, MD  cholecalciferol (VITAMIN D) 1000 UNITS tablet Take 1 tablet (1,000 Units total) by mouth daily. Patient taking differently: Take 1,000 Units by mouth every morning.  03/27/15   Josalyn Funches, MD  clonazePAM (KLONOPIN) 0.5 MG tablet Take 1 tablet (0.5 mg total) by mouth 2 (two) times daily as needed (trouble sleeping and anxiety). 03/27/15   Josalyn Funches, MD  cloNIDine (CATAPRES) 0.2 MG tablet Take 1 tablet (0.2 mg total) by mouth 3 (three) times daily. 12/15/14   Fay Records, MD  dextromethorphan-guaiFENesin [facility] DM) 30-600 MG per  12 hr tablet Take 1 tablet by mouth 2 (two) times daily as needed (with flutter valve for cough, congestion.).     Historical Provider, MD  ferrous sulfate 325 (65 FE) MG tablet Take 1 tablet (325 mg total) by mouth daily with breakfast. 12/31/14   Boykin Nearing, MD  FOLIC ACID PO Take 1 tablet by mouth every morning.    Historical Provider, MD  furosemide (LASIX) 40 MG tablet Take 1 tablet (40 mg total) by mouth every morning. 03/27/15   Josalyn Funches, MD  gabapentin (NEURONTIN) 400 MG capsule Take 2 capsules (800 mg total) by mouth 2 (two) times daily. 05/12/15   Josalyn Funches, MD  glucose blood (RELION GLUCOSE  TEST STRIPS) test strip 1 each by Other route 4 (four) times daily. ICD code E11.65 03/27/15   Boykin Nearing, MD  insulin aspart (NOVOLOG) 100 UNIT/ML injection Inject 70 Units into the skin 3 (three) times daily before meals. 04/27/15   Josalyn Funches, MD  Insulin Glargine (LANTUS SOLOSTAR) 100 UNIT/ML Solostar Pen Inject 90 Units into the skin at bedtime. 03/27/15   Josalyn Funches, MD  Insulin Pen Needle (B-D ULTRAFINE III SHORT PEN) 31G X 8 MM MISC 1 application by Does not apply route daily. 03/27/15   Josalyn Funches, MD  Insulin Syringe-Needle U-100 (BD INSULIN SYRINGE ULTRAFINE) 31G X 15/64" 1 ML MISC 1 each by Does not apply route 3 (three) times daily. 03/27/15   Boykin Nearing, MD  Lancet Devices [facility]) lancets Use as instructed 04/03/15   Boykin Nearing, MD  losartan (COZAAR) 100 MG tablet Take 1 tablet (100 mg total) by mouth daily. Patient taking differently: Take 100 mg by mouth every morning.  04/27/15   Boykin Nearing, MD  metFORMIN (GLUCOPHAGE) 500 MG tablet Take 1 tablet (500 mg total) by mouth 2 (two) times daily with a meal. 03/27/15   Josalyn Funches, MD  methocarbamol (ROBAXIN) 750 MG tablet Take 1 tablet (750 mg total) by mouth every 8 (eight) hours as needed for muscle spasms. 03/30/15   Josalyn Funches, MD  mometasone-formoterol (DULERA) 100-5 MCG/ACT AERO Inhale 2 puffs into the lungs 2 (two) times daily. Take 2 puffs first thing in am and then another 2 puffs about 12 hours later. 03/27/15   Josalyn Funches, MD  montelukast (SINGULAIR) 10 MG tablet Take 1 tablet (10 mg total) by mouth at bedtime. 03/27/15   Josalyn Funches, MD  omeprazole (PRILOSEC) 20 MG capsule Take 1 capsule (20 mg total) by mouth daily. Patient taking differently: Take 20 mg by mouth daily before breakfast.  04/27/15   Boykin Nearing, MD  [place] Calcium 500 MG TABS Take 0.4 tablets (500 mg total) by mouth 2 (two) times daily after a meal. 03/27/15   Josalyn Funches, MD  PARoxetine (PAXIL)  20 MG tablet Take 20 mg by mouth every morning.     Historical Provider, MD  predniSONE (DELTASONE) 5 MG tablet Take 1 tablet (5 mg total) by mouth daily with breakfast. 03/24/15   Boykin Nearing, MD  ranitidine (ZANTAC) 150 MG tablet Take 2 tablets (300 mg total) by mouth at bedtime. 04/06/15   Boykin Nearing, MD  ReliOn Ultra Thin Lancets MISC 1 each by Does not apply route 4 (four) times daily. ICD code E11.65 03/27/15   Boykin Nearing, MD  Respiratory Therapy Supplies (FLUTTER) DEVI Use as directed 09/16/14   Tanda Rockers, MD  verapamil (VERELAN PM) 360 MG 24 hr capsule Take 1 capsule (360 mg total) by mouth every  morning. 03/27/15   Boykin Nearing, MD   Meds Ordered and Administered this Visit  Medications - No data to display  BP 133/68 mmHg  Pulse 96  Temp(Src) 98.3 F (36.8 C) (Oral)  Resp 18  SpO2 100%  LMP 05/02/2015 No data found.   Physical Exam  Musculoskeletal:       Cervical back: Normal.       Arms: TTP centered over left mid clavicle. No palpable deformity. No bruising noted. Swelling noted over mid clavicle. Left shoulder with mild lateral ttp. Full rom. No crepitus. No point tenderness left elbow or left wrist. Normal strength left shoulder, elbow, wrist. Normal sensation LUE.     ED Course  Procedures (including critical care time)  Labs Review Labs Reviewed - No data to display  Imaging Review No results found.  MDM   1. Left shoulder pain   2. Clavicle pain, left    Normal xray. Likely trapezius strain from fall. Scheduled nsaids. Ice. Slowly start resuming left shoulder rom exercises in few days. RTC if no improvement one week.     Araceli Bouche, [place] 05/13/15 [phone]

## 2015-05-20 ENCOUNTER — Encounter: Payer: Self-pay | Admitting: Pulmonary Disease

## 2015-05-20 ENCOUNTER — Telehealth: Payer: Self-pay | Admitting: *Deleted

## 2015-05-20 ENCOUNTER — Ambulatory Visit (INDEPENDENT_AMBULATORY_CARE_PROVIDER_SITE_OTHER): Payer: Medicaid Other | Admitting: Pulmonary Disease

## 2015-05-20 VITALS — BP 120/70 | HR 86 | Ht <= 58 in | Wt 179.6 lb

## 2015-05-20 DIAGNOSIS — G4733 Obstructive sleep apnea (adult) (pediatric): Secondary | ICD-10-CM

## 2015-05-20 DIAGNOSIS — J45998 Other asthma: Secondary | ICD-10-CM

## 2015-05-20 HISTORY — DX: Obstructive sleep apnea (adult) (pediatric): G47.33

## 2015-05-20 NOTE — Telephone Encounter (Signed)
Pt here in our clinic with Insuline question about prices. Advised to talk to pharmacy about copay Pap smear results given and informed next smear in 3 year Pt verbalized understanding Information was given in [place]

## 2015-05-20 NOTE — Assessment & Plan Note (Signed)
Given excessive daytime somnolence, narrow pharyngeal exam, witnessed apneas & loud snoring, obstructive sleep apnea is very likely & an overnight polysomnogram will be scheduled as a split study. The pathophysiology of obstructive sleep apnea , it's cardiovascular consequences & modes of treatment including CPAP were discused with the patient in detail & they evidenced understanding.  

## 2015-05-20 NOTE — Telephone Encounter (Signed)
-----   Message from Boykin Nearing, MD sent at 05/04/2015  8:52 AM EDT ----- HPV negative ASCUS on pap. This means that there are some atypical cells, but the amount of change is uncertain. The good news that HPV, the virus commonly associated with cervical cancer is negative. Plan, repeat co-testing in 3 years.

## 2015-05-20 NOTE — Progress Notes (Signed)
Subjective:    Patient ID: Raven Turner, female    DOB: 1971-03-29, 44 y.o.   MRN: [id number]  HPI  44 year old [place] speaking woman presents for evaluation of sleep-disordered breathing, accompanied by [place] interpreter. She has a history of lumbar spine compression fracture, and wears a hard back brace for most of the daytime. She has asthma since childhood, now steroid-dependent for the past 6 years. She is maintained on a regimen of Dulera and Singulair. She takes clonazepam for anxiety, for her pain she is on Neurontin and Tylenol/codeine. Epworth sleepiness score is 16 and she reports excessive daytime somnolence and fatigue in various social situations. Her family has noted loud snoring and witnessed apneas. She is occasionally woken herself up by herself and snoring. Her sister is overweight, has OSA and uses a CPAP machine. Bedtime is around 10 PM, she uses her clonazepam and an albuterol neb close to bedtime. It still takes her about an hour to fall sleep, she sleeps on her side with one pillow, reports 2 nocturnal awakenings due to pain, denies nocturia, and is out of bed by 8:30 AM with dryness of mouth and complaining of chronic pain. She naps about 1-2 hours and does so for recliner while watching TV and feels rested afterwards She was placed on clonazepam by her PCP in [place], she now lives in [place] for the last 2 years  There is no history suggestive of cataplexy, sleep paralysis or parasomnias Her weight is unchanged for the last 2 years   Past Medical History  Diagnosis Date  . Asthma   . Cushing syndrome 2013   . Depression   . Anemia   . Osteopenia 11/19/2013    Dx with osteopenia in lumbar vertebra with partial compression of L1  . Diabetes 2013   . Diabetes mellitus with neuropathy   . HTN (hypertension) 2005   . COPD (chronic obstructive pulmonary disease) 2013  . Compression fracture     Past Surgical History  Procedure  Laterality Date  . Cataract extraction Bilateral 2014  . Vaginal hysterectomy      Allergies  Allergen Reactions  . Shellfish Allergy Anaphylaxis  . Septra [Sulfamethoxazole-Trimethoprim] Rash and Other (See Comments)    Facial redness with pruritus   . Hydrocortisone Rash    Social History   Social History  . Marital Status: Married    Spouse Name: N/A  . Number of Children: N/A  . Years of Education: N/A   Occupational History  . Not on file.   Social History Main Topics  . Smoking status: Never Smoker   . Smokeless tobacco: Never Used  . Alcohol Use: No  . Drug Use: No  . Sexual Activity: Not on file   Other Topics Concern  . Not on file   Social History Narrative    Family History  Problem Relation Age of Onset  . Diabetes Mother   . Stroke Father   . Asthma Father       Review of Systems  Constitutional: Negative for fever, chills and unexpected weight change.  HENT: Negative for congestion, dental problem, ear pain, nosebleeds, postnasal drip, rhinorrhea, sinus pressure, sneezing, sore throat, trouble swallowing and voice change.   Eyes: Negative for visual disturbance.  Respiratory: Negative for cough, choking and shortness of breath.   Cardiovascular: Negative for chest pain and leg swelling.  Gastrointestinal: Negative for vomiting, abdominal pain and diarrhea.  Genitourinary: Negative for difficulty urinating.  Musculoskeletal: Negative for  arthralgias.  Skin: Negative for rash.  Neurological: Negative for tremors, syncope and headaches.  Hematological: Does not bruise/bleed easily.       Objective:   Physical Exam  Gen. Pleasant, obese, in no distress, normal affect ENT - no lesions, no post nasal drip, class 2-3 airway Neck: No JVD, no thyromegaly, no carotid bruits Lungs: no use of accessory muscles, no dullness to percussion, faint scattered rhonchi  Cardiovascular: Rhythm regular, heart sounds  normal, no murmurs or gallops, no  peripheral edema Abdomen: soft and non-tender, no hepatosplenomegaly, BS normal. Musculoskeletal: No deformities, no cyanosis or clubbing Neuro:  alert, non focal, no tremors       Assessment & Plan:

## 2015-05-20 NOTE — Assessment & Plan Note (Signed)
Her steroids are being gradually tapered She does not seem to have nocturnal awakenings due to asthma but I do hear scattered rhonchi. I emphasized compliance with [facility]

## 2015-05-20 NOTE — Patient Instructions (Signed)
Home sleep study Based on this, you may need CpAP therapy

## 2015-05-27 ENCOUNTER — Other Ambulatory Visit: Payer: Self-pay | Admitting: Family Medicine

## 2015-06-24 ENCOUNTER — Encounter: Payer: Self-pay | Admitting: Family Medicine

## 2015-06-24 ENCOUNTER — Ambulatory Visit: Payer: Medicaid Other | Attending: Family Medicine | Admitting: Family Medicine

## 2015-06-24 VITALS — BP 127/82 | HR 89 | Temp 98.3°F | Resp 18 | Ht <= 58 in | Wt 184.0 lb

## 2015-06-24 DIAGNOSIS — H539 Unspecified visual disturbance: Secondary | ICD-10-CM | POA: Diagnosis not present

## 2015-06-24 DIAGNOSIS — R0602 Shortness of breath: Secondary | ICD-10-CM | POA: Diagnosis not present

## 2015-06-24 DIAGNOSIS — Z794 Long term (current) use of insulin: Secondary | ICD-10-CM | POA: Insufficient documentation

## 2015-06-24 DIAGNOSIS — Z79899 Other long term (current) drug therapy: Secondary | ICD-10-CM | POA: Diagnosis not present

## 2015-06-24 DIAGNOSIS — R208 Other disturbances of skin sensation: Secondary | ICD-10-CM | POA: Diagnosis not present

## 2015-06-24 DIAGNOSIS — R200 Anesthesia of skin: Secondary | ICD-10-CM

## 2015-06-24 DIAGNOSIS — E1165 Type 2 diabetes mellitus with hyperglycemia: Secondary | ICD-10-CM | POA: Diagnosis not present

## 2015-06-24 DIAGNOSIS — E242 Drug-induced Cushing's syndrome: Secondary | ICD-10-CM | POA: Insufficient documentation

## 2015-06-24 DIAGNOSIS — R05 Cough: Secondary | ICD-10-CM | POA: Insufficient documentation

## 2015-06-24 DIAGNOSIS — E249 Cushing's syndrome, unspecified: Secondary | ICD-10-CM

## 2015-06-24 DIAGNOSIS — Z23 Encounter for immunization: Secondary | ICD-10-CM

## 2015-06-24 DIAGNOSIS — Z7982 Long term (current) use of aspirin: Secondary | ICD-10-CM | POA: Insufficient documentation

## 2015-06-24 DIAGNOSIS — J45998 Other asthma: Secondary | ICD-10-CM

## 2015-06-24 DIAGNOSIS — Z7984 Long term (current) use of oral hypoglycemic drugs: Secondary | ICD-10-CM | POA: Diagnosis not present

## 2015-06-24 DIAGNOSIS — R0789 Other chest pain: Secondary | ICD-10-CM | POA: Insufficient documentation

## 2015-06-24 DIAGNOSIS — E119 Type 2 diabetes mellitus without complications: Secondary | ICD-10-CM | POA: Diagnosis present

## 2015-06-24 LAB — POCT URINALYSIS DIPSTICK
Bilirubin, UA: NEGATIVE
Glucose, UA: 500
Ketones, UA: NEGATIVE
Leukocytes, UA: NEGATIVE
Nitrite, UA: NEGATIVE
Protein, UA: NEGATIVE
Spec Grav, UA: 1.025
Urobilinogen, UA: 0.2
pH, UA: 5

## 2015-06-24 LAB — GLUCOSE, POCT (MANUAL RESULT ENTRY): POC Glucose: 271 mg/dl — AB (ref 70–99)

## 2015-06-24 LAB — POCT GLYCOSYLATED HEMOGLOBIN (HGB A1C): Hemoglobin A1C: 7.8

## 2015-06-24 MED ORDER — MOMETASONE FURO-FORMOTEROL FUM 200-5 MCG/ACT IN AERO: 2.0000 | INHALATION_SPRAY | Freq: Two times a day (BID) | RESPIRATORY_TRACT | Status: DC

## 2015-06-24 MED ORDER — INSULIN GLARGINE 100 UNIT/ML SOLOSTAR PEN: 80.0000 [IU] | PEN_INJECTOR | Freq: Every day | SUBCUTANEOUS | Status: DC

## 2015-06-24 NOTE — Progress Notes (Signed)
Patient ID: Raven Turner, female   DOB: [birth year], 44 y.o.   MRN: [id number]   Subjective:  Patient ID: Raven Turner, female    DOB: May 05, 1971  Age: 44 y.o. MRN: [id number]  CC: Diabetes   HPI Raven Turner presents for    1. CHRONIC DIABETES  Disease Monitoring  Blood Sugar Ranges: 109-323. Fasting 104-165  Polyuria: no   Visual problems: yes   Medication Compliance: yes, feels sugar are running high on lantus 70 U daily  Medication Side Effects  Hypoglycemia: no   Preventitive Health Care  Eye Exam: done recently   Foot Exam: done recently   Diet pattern: done   Exercise: moderate  2. Asthma/VCD or pseudo-asthma: has decreased prednisone to 4 mg daily. Feeling worsening chest tightness and SOB. Taking singulair and dulera. No fever or chills or chest pain except for L medial clavicle pain. Has intermittent cough that is non-productive. Has some wheezing.   3. Hand numbness: x 2 weeks. Evaluated by her endocrinologist. Referred to PT. Numbness is uncomfortable. It is associated with weakness in R hand.   Social History  Substance Use Topics  . Smoking status: Never Smoker   . Smokeless tobacco: Never Used  . Alcohol Use: No    Outpatient Prescriptions Prior to Visit  Medication Sig Dispense Refill  . acetaminophen-codeine (TYLENOL #3) 300-30 MG per tablet Take 1-2 tablets by mouth at bedtime as needed for moderate pain. (Patient taking differently: Take 1-2 tablets by mouth at bedtime as needed (cough and severe pain). ) 60 tablet 2  . albuterol (PROAIR HFA) 108 (90 BASE) MCG/ACT inhaler 2 puffs every 4 hours as needed only  For wheezing/Shortness of breath (plan B) (Patient taking differently: 2 puffs every 4 hours as needed only  For wheezing/Shortness of breath )(Plan B))) 1 Inhaler 5  . albuterol (PROVENTIL) (2.5 MG/3ML) 0.083% nebulizer solution Take 3 mLs (2.5 mg total) by nebulization every 4 (four) hours as needed for wheezing or shortness of  breath (plan C). (Patient taking differently: Take 2.5 mg by nebulization every 4 (four) hours as needed for wheezing or shortness of breath (((Plan C))). ) 75 mL 3  . aspirin 325 MG tablet Take 1 tablet (325 mg total) by mouth daily. (Patient taking differently: Take 325 mg by mouth every morning. ) 30 tablet 3  . atorvastatin (LIPITOR) 20 MG tablet Take 1 tablet (20 mg total) by mouth daily. (Patient taking differently: Take 20 mg by mouth at bedtime. ) 30 tablet 11  . Blood Glucose Monitoring Suppl (ACCU-CHEK AVIVA PLUS) W/DEVICE KIT Used as directed 1 kit 0  . cholecalciferol (VITAMIN D) 1000 UNITS tablet Take 1 tablet (1,000 Units total) by mouth daily. (Patient taking differently: Take 1,000 Units by mouth every morning. ) 30 tablet 11  . clonazePAM (KLONOPIN) 0.5 MG tablet Take 1 tablet (0.5 mg total) by mouth 2 (two) times daily as needed (trouble sleeping and anxiety). 30 tablet 2  . cloNIDine (CATAPRES) 0.2 MG tablet Take 1 tablet (0.2 mg total) by mouth 3 (three) times daily. 90 tablet 11  . dextromethorphan-guaiFENesin (MUCINEX DM) 30-600 MG per 12 hr tablet Take 1 tablet by mouth 2 (two) times daily as needed (with flutter valve for cough, congestion.).     Marland Kitchen ferrous sulfate 325 (65 FE) MG tablet Take 1 tablet (325 mg total) by mouth daily with breakfast. 30 tablet 3  . FOLIC ACID PO Take 1 tablet by mouth every morning.    Marland Kitchen  furosemide (LASIX) 40 MG tablet Take 1 tablet (40 mg total) by mouth every morning. 30 tablet 2  . gabapentin (NEURONTIN) 400 MG capsule Take 2 capsules (800 mg total) by mouth 2 (two) times daily. 120 capsule 1  . glucose blood (RELION GLUCOSE TEST STRIPS) test strip 1 each by Other route 4 (four) times daily. ICD code E11.65 400 each 3  . insulin aspart (NOVOLOG) 100 UNIT/ML injection Inject 70 Units into the skin 3 (three) times daily before meals. 3 vial 3  . Insulin Glargine (LANTUS SOLOSTAR) 100 UNIT/ML Solostar Pen Inject 90 Units into the skin at bedtime. 5  pen 11  . Insulin Pen Needle (B-D ULTRAFINE III SHORT PEN) 31G X 8 MM MISC 1 application by Does not apply route daily. 100 each 3  . Insulin Syringe-Needle U-100 (BD INSULIN SYRINGE ULTRAFINE) 31G X 15/64" 1 ML MISC 1 each by Does not apply route 3 (three) times daily. 300 each 3  . Lancet Devices (ACCU-CHEK SOFTCLIX) lancets Use as instructed 1 each 0  . losartan (COZAAR) 100 MG tablet Take 1 tablet (100 mg total) by mouth daily. (Patient taking differently: Take 100 mg by mouth every morning. ) 30 tablet 3  . metFORMIN (GLUCOPHAGE) 500 MG tablet Take 1 tablet (500 mg total) by mouth 2 (two) times daily with a meal. 60 tablet 11  . methocarbamol (ROBAXIN) 750 MG tablet Take 1 tablet (750 mg total) by mouth every 8 (eight) hours as needed for muscle spasms. 90 tablet 2  . mometasone-formoterol (DULERA) 100-5 MCG/ACT AERO Inhale 2 puffs into the lungs 2 (two) times daily. Take 2 puffs first thing in am and then another 2 puffs about 12 hours later. 13 g 6  . montelukast (SINGULAIR) 10 MG tablet Take 1 tablet (10 mg total) by mouth at bedtime. 30 tablet 11  . omeprazole (PRILOSEC) 20 MG capsule Take 1 capsule (20 mg total) by mouth daily. (Patient taking differently: Take 20 mg by mouth daily before breakfast. ) 90 capsule 1  . Oyster Shell Calcium 500 MG TABS Take 0.4 tablets (500 mg total) by mouth 2 (two) times daily after a meal. 60 tablet 11  . PARoxetine (PAXIL) 20 MG tablet Take 20 mg by mouth every morning.     . predniSONE (DELTASONE) 1 MG tablet Take 4 mg by mouth daily with breakfast.    . ranitidine (ZANTAC) 150 MG tablet Take 2 tablets (300 mg total) by mouth at bedtime. 60 tablet 5  . ReliOn Ultra Thin Lancets MISC 1 each by Does not apply route 4 (four) times daily. ICD code E11.65 400 each 3  . Respiratory Therapy Supplies (FLUTTER) DEVI Use as directed 1 each 0  . verapamil (VERELAN PM) 360 MG 24 hr capsule Take 1 capsule (360 mg total) by mouth every morning. 30 capsule 11    Facility-Administered Medications Prior to Visit  Medication Dose Route Frequency Provider Last Rate Last Dose  . DOBUTamine (DOBUTREX) 1,000 mcg/mL in dextrose 5% 250 mL infusion  30 mcg/kg/min Intravenous Continuous Mihai Croitoru, MD 145.3 mL/hr at 04/22/15 1300 30 mcg/kg/min at 04/22/15 1300    ROS Review of Systems  Constitutional: Negative for fever and chills.  Eyes: Positive for visual disturbance.  Respiratory: Negative for shortness of breath.   Cardiovascular: Negative for chest pain.  Gastrointestinal: Negative for abdominal pain and blood in stool.  Musculoskeletal: Negative for back pain and arthralgias.  Skin: Negative for rash.  Allergic/Immunologic: Negative for immunocompromised state.  Neurological:  Positive for numbness.  Hematological: Negative for adenopathy. Does not bruise/bleed easily.  Psychiatric/Behavioral: Negative for suicidal ideas and dysphoric mood.    Objective:  BP 127/82 mmHg  Pulse 89  Temp(Src) 98.3 F (36.8 C) (Oral)  Resp 18  Ht $R'4\' 10"'am$  (1.473 m)  Wt 184 lb (83.462 kg)  BMI 38.47 kg/m2  SpO2 97%  LMP 06/22/2015  BP/Weight 06/24/2015 05/20/2015 0/05/3817  Systolic BP 299 371 696  Diastolic BP 82 70 68  Wt. (Lbs) 184 179.6 -  BMI 38.47 37.55 -   BP Readings from Last 3 Encounters:  05/20/15 120/70  05/13/15 133/68  05/12/15 136/84    Wt Readings from Last 3 Encounters:  06/24/15 184 lb (83.462 kg)  05/20/15 179 lb 9.6 oz (81.466 kg)  05/12/15 180 lb (81.647 kg)    Physical Exam  Constitutional: She is oriented to person, place, and time. She appears well-developed and well-nourished. No distress.  Obese, Cushingnoid   HENT:  Head: Normocephalic and atraumatic.  Cardiovascular: Normal rate, regular rhythm, normal heart sounds and intact distal pulses.   Pulmonary/Chest: Effort normal. She has wheezes (upper airway wheezing only. lungs are clear ).  Musculoskeletal: She exhibits no edema or tenderness.  Slight decrease  grip strength in R hand compared to L   Neurological: She is alert and oriented to person, place, and time.  Skin: Skin is warm and dry. No rash noted.  Psychiatric: She has a normal mood and affect.   Lab Results  Component Value Date   HGBA1C 7.80 06/24/2015   CBG 271  Assessment & Plan:   Problem List Items Addressed This Visit    Asthma ? severity vs VCD/ pseudo asthma   Relevant Medications   mometasone-formoterol (DULERA) 200-5 MCG/ACT AERO   Bilateral hand numbness    A: hand numbness possible carpal tunnel P: Referral to PT placed again as it needs to be placed by PCP Referral to neurology       Relevant Orders   Ambulatory referral to Physical Therapy   Ambulatory referral to Neurology   Diabetes mellitus type 2, uncontrolled ([place]) - Primary (Chronic)   Relevant Medications   Insulin Glargine (LANTUS SOLOSTAR) 100 UNIT/ML Solostar Pen   Other Relevant Orders   POCT glucose (manual entry) (Completed)   POCT glycosylated hemoglobin (Hb A1C) (Completed)   POCT urinalysis dipstick (Completed)   Secondary Cushing's syndrome/ iatrogenic    A: patient tapering down off oral prednisone reporting increase in CBGs and worsening "asthma" with recent taper P: Keep prednisone at 4 mg Increase lantus to 80 U Increase dulera dose        Other Visit Diagnoses    Vision changes        Relevant Orders    Ambulatory referral to Ophthalmology       No orders of the defined types were placed in this encounter.    Follow-up: No Follow-up on file.   Boykin Nearing MD

## 2015-06-24 NOTE — Progress Notes (Signed)
F/U DM Taking medication as prescribed Glucose running around 200 No hx tobacco

## 2015-06-24 NOTE — Patient Instructions (Addendum)
Raven Turner was seen today for diabetes.  Diagnoses and all orders for this visit:  Uncontrolled type 2 diabetes mellitus with hyperglycemia, with long-term current use of insulin (HCC) -     POCT glucose (manual entry) -     POCT glycosylated hemoglobin (Hb A1C) -     POCT urinalysis dipstick -     Insulin Glargine (LANTUS SOLOSTAR) 100 UNIT/ML Solostar Pen; Inject 80 Units into the skin at bedtime.  Asthma ? severity vs VCD/ pseudo asthma -     mometasone-formoterol (DULERA) 200-5 MCG/ACT AERO; Inhale 2 puffs into the lungs 2 (two) times daily.  Vision changes -     Ambulatory referral to Ophthalmology  Bilateral hand numbness -     Ambulatory referral to Physical Therapy -     Ambulatory referral to Neurology  order for splints  F/u in 3 months for diabetes  Dr. Adrian Blackwater

## 2015-06-25 DIAGNOSIS — H539 Unspecified visual disturbance: Secondary | ICD-10-CM | POA: Insufficient documentation

## 2015-06-25 DIAGNOSIS — R200 Anesthesia of skin: Secondary | ICD-10-CM | POA: Insufficient documentation

## 2015-06-25 HISTORY — DX: Unspecified visual disturbance: H53.9

## 2015-06-25 HISTORY — DX: Anesthesia of skin: R20.0

## 2015-06-25 NOTE — Assessment & Plan Note (Signed)
A: improved A1c. Recently elevation in CBG with decrease in lantus P: Increase lantus from reported 70 U to 80 U daily

## 2015-06-25 NOTE — Assessment & Plan Note (Signed)
A: patient tapering down off oral prednisone reporting increase in CBGs and worsening "asthma" with recent taper P: Keep prednisone at 4 mg Increase lantus to 80 U Increase dulera dose

## 2015-06-25 NOTE — Assessment & Plan Note (Signed)
A: vision changes reported, patient reports decrease in near vision P: Opthalmology referral placed

## 2015-06-25 NOTE — Assessment & Plan Note (Signed)
A: hand numbness possible carpal tunnel P: Referral to PT placed again as it needs to be placed by PCP Referral to neurology

## 2015-06-30 ENCOUNTER — Ambulatory Visit (HOSPITAL_BASED_OUTPATIENT_CLINIC_OR_DEPARTMENT_OTHER): Payer: Medicaid Other | Attending: Pulmonary Disease | Admitting: Radiology

## 2015-06-30 VITALS — Ht <= 58 in | Wt 180.0 lb

## 2015-06-30 DIAGNOSIS — Z79899 Other long term (current) drug therapy: Secondary | ICD-10-CM | POA: Insufficient documentation

## 2015-06-30 DIAGNOSIS — I493 Ventricular premature depolarization: Secondary | ICD-10-CM | POA: Insufficient documentation

## 2015-06-30 DIAGNOSIS — Z794 Long term (current) use of insulin: Secondary | ICD-10-CM | POA: Diagnosis not present

## 2015-06-30 DIAGNOSIS — R5383 Other fatigue: Secondary | ICD-10-CM | POA: Diagnosis not present

## 2015-06-30 DIAGNOSIS — E119 Type 2 diabetes mellitus without complications: Secondary | ICD-10-CM | POA: Insufficient documentation

## 2015-06-30 DIAGNOSIS — R0683 Snoring: Secondary | ICD-10-CM | POA: Insufficient documentation

## 2015-06-30 DIAGNOSIS — I10 Essential (primary) hypertension: Secondary | ICD-10-CM | POA: Diagnosis not present

## 2015-06-30 DIAGNOSIS — G4733 Obstructive sleep apnea (adult) (pediatric): Secondary | ICD-10-CM

## 2015-07-07 ENCOUNTER — Encounter (HOSPITAL_BASED_OUTPATIENT_CLINIC_OR_DEPARTMENT_OTHER): Payer: Medicaid Other | Admitting: Pulmonary Disease

## 2015-07-07 ENCOUNTER — Telehealth: Payer: Self-pay | Admitting: Pulmonary Disease

## 2015-07-07 DIAGNOSIS — G4733 Obstructive sleep apnea (adult) (pediatric): Secondary | ICD-10-CM

## 2015-07-07 NOTE — Telephone Encounter (Signed)
Patient notified of results. Verbalized understanding Nothing further needed.

## 2015-07-07 NOTE — Progress Notes (Signed)
Patient Name: Raven Turner Doctor Date: 06/30/2015 Gender: Female D.O.B: November 07, 1970 Age (years): 32 Referring Provider: Kara Mead MD, ABSM Height (inches): 58 Interpreting Physician: Kara Mead MD, ABSM Weight (lbs): 180 RPSGT: Carolin Coy BMI: 38 MRN: [id number] Neck Size: 14.50  CLINICAL INFORMATION Sleep Study Type: Split Night  Indication for sleep study: Diabetes, Fatigue, Hypertension, Snoring Epworth Sleepiness Score: 16   SLEEP STUDY TECHNIQUE As per the AASM Manual for the Scoring of Sleep and Associated Events v2.3 (April 2016) with a hypopnea requiring 4% desaturations. The channels recorded and monitored were frontal, central and occipital EEG, electrooculogram (EOG), submentalis EMG (chin), nasal and oral airflow, thoracic and abdominal wall motion, anterior tibialis EMG, snore microphone, electrocardiogram, and pulse oximetry. Continuous positive airway pressure (CPAP) was initiated when the patient met split night criteria and was titrated according to treat sleep-disordered breathing.   MEDICATIONS Medications taken by the patient : Patient reported taking Tylenol #3, Klonopin, Neurontin, Lantus, Cozaar, Robaxin, Singulair, and Zantac at 10:00 pm. Medications administered by patient during sleep study : No sleep medicine administered.   RESPIRATORY PARAMETERS Diagnostic Total AHI (/hr): 0.2 RDI (/hr): 0.9 OA Index (/hr): - CA Index (/hr): 0.0 REM AHI (/hr): N/A NREM AHI (/hr): 0.2 Supine AHI (/hr): 0.0 Non-supine AHI (/hr): 0.23 Min O2 Sat (%): 93.00 Mean O2 (%): 95.84 Time below 88% (min): 0.0     SLEEP ARCHITECTURE The recording time for the entire night was 375.1 minutes. During a baseline period of 375.1 minutes, the patient slept for 275.0 minutes in REM and nonREM, yielding a sleep efficiency of 73.3%. Sleep onset after lights out was 48.4 minutes with a REM latency of N/A minutes. The patient spent 15.45% of the night in stage N1 sleep, 84.55% in stage  N2 sleep, 0.00% in stage N3 and 0.00% in REM. During the titration period of 0.0 minutes, the patient slept for 0.0 minutes in REM and nonREM, yielding a sleep efficiency of N/A%. Sleep onset after CPAP initiation was N/A minutes with a REM latency of N/A minutes. The patient spent N/A% of the night in stage N1 sleep, N/A% in stage N2 sleep, N/A% in stage N3 and N/A% in REM.   CARDIAC DATA The 2 lead EKG demonstrated sinus rhythm. The mean heart rate was 62.10 beats per minute. Other EKG findings include: PVCs.  LEG MOVEMENT DATA The total Periodic Limb Movements of Sleep (PLMS) were 192. The PLMS index was 41.89 .  IMPRESSIONS - No significant obstructive sleep apnea occurred during the diagnostic portion of the study (AHI = 0.2/hour).  - No significant central sleep apnea occurred during the diagnostic portion of the study (CAI = 0.0/hour). - The patient snored with Moderate snoring volume . - EKG findings include PVCs. 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   DIAGNOSIS - No evidence of sleep disordered breathing - Moderate Limb movements were noted But most of these were not associated with arousals   RECOMMENDATIONS - Avoid alcohol, sedatives and other CNS depressants that may worsen sleep apnea and disrupt normal sleep architecture. - Sleep hygiene should be reviewed to assess factors that may improve sleep quality. - Weight management and regular exercise should be initiated or continued.  Kara Mead MD. [facility]. [facility]

## 2015-07-07 NOTE — Telephone Encounter (Signed)
Sleep study did not show significant sleep disordered breathing No follow-up required

## 2015-07-08 ENCOUNTER — Other Ambulatory Visit: Payer: Self-pay | Admitting: *Deleted

## 2015-07-08 ENCOUNTER — Telehealth: Payer: Self-pay | Admitting: Family Medicine

## 2015-07-08 MED ORDER — OMEPRAZOLE 20 MG PO CPDR: 20.0000 mg | DELAYED_RELEASE_CAPSULE | Freq: Every day | ORAL | Status: DC

## 2015-07-08 MED ORDER — INSULIN ASPART 100 UNIT/ML ~~LOC~~ SOLN: 80.0000 [IU] | Freq: Three times a day (TID) | SUBCUTANEOUS | Status: DC

## 2015-07-09 MED ORDER — GLUCOSE BLOOD VI STRP: 1.0000 | ORAL_STRIP | Freq: Four times a day (QID) | Status: DC

## 2015-07-09 NOTE — Telephone Encounter (Signed)
Refill Gabapentin send to [facility]  Pt notified

## 2015-07-09 NOTE — Telephone Encounter (Signed)
Pt. Called requesting a med refill on gabapentin (NEURONTIN) 400 MG capsule. Please f/u with pt.

## 2015-07-13 ENCOUNTER — Ambulatory Visit (INDEPENDENT_AMBULATORY_CARE_PROVIDER_SITE_OTHER): Payer: Medicaid Other | Admitting: Internal Medicine

## 2015-07-13 ENCOUNTER — Encounter: Payer: Self-pay | Admitting: Internal Medicine

## 2015-07-13 VITALS — BP 120/72 | HR 82 | Ht <= 58 in | Wt 182.4 lb

## 2015-07-13 DIAGNOSIS — R05 Cough: Secondary | ICD-10-CM

## 2015-07-13 DIAGNOSIS — J45998 Other asthma: Secondary | ICD-10-CM

## 2015-07-13 DIAGNOSIS — R058 Other specified cough: Secondary | ICD-10-CM

## 2015-07-13 MED ORDER — MOMETASONE FURO-FORMOTEROL FUM 100-5 MCG/ACT IN AERO: INHALATION_SPRAY | RESPIRATORY_TRACT | Status: DC

## 2015-07-13 NOTE — Progress Notes (Signed)
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  Onset was childhood but continued to have "good days and bad days" until 2010 and no good days since then even p pred started and no better since arrived in [place] summer 2015 with no evidence of any airflow obst by pfts 11/03/14    History of Present Illness  09/04/2014 1st [facility] office visit/ Hadyn Azer   Chief Complaint  Patient presents with  . Advice Only    Referred for chronic bronchitis. Recently moved from [place].   Every day's about the same since 2012 admitted to [place] every month but not since arrival in [place]/   maintaining on prednisone ? Dose plus theoph/ brethine/ advair 500 and nebs "all day long"  But interestingly less symptoms nocturnally. Cough is mostly dry assoc with hoarseness  rec Stop the advair, the brethine  and the theophylline Start dulera 100 Take 2 puffs first thing in am and then another 2 puffs about 12 hours later.  Continue prilosec 20 mg Take 30- 60 min before your first and last meals of the day  GERD diet  Only use your albuterol nebulizer  If needed  If you find you don't need the nebulizer as much, then we can start reducing your prednisone by 10 mg a week   Admit date: 09/07/2014 Discharge date: 09/09/2014  Discharge Diagnoses:  Active Hospital Problems   Diagnosis Date Noted  . Pneumonia 09/07/2014  . Morbid obesity 09/08/2014  . Non-English speaking patient 09/08/2014  . Diabetes mellitus type 2, uncontrolled 09/07/2014  . Asthma exacerbation 09/07/2014  . Hypertension 09/07/2014  . HTN (hypertension)      Patient is a 44 year old Hispanic female, non-English speaking with past history  of asthma on chronic prednisone, diabetes mellitus and hypertension who was admitted to the hospitalist service on the night of 12/27 for several days of persistent productive cough, wheezing and shortness of breath. Patient was noted in the emergency room to have a mild leukocytosis as well as an early developing left upper lobe pneumonia. Patient started on nebulizers, oxygen and antibiotics.  Hospital Course:  Principal Problem:  Acute respiratory failure/Asthma exacerbation/Pneumonia: Continue IV Levaquin, add incentive spirometer, changed by mouth prednisone to IV Solu-Medrol given significant wheezing. Patient and mildly tachycardic so adding back oxygen. Added nebulizers. By 12/29, patient looked to be significantly better and off of oxygen. With ambulation, she contraction saturations at 96% although she did have some significant tachycardia. Part of this looked to be from pain from her hip which is chronic. There is also some question based off of her pulmonologist note that the way she takes her inhalers to be causing her to do more harm than good. As per their recommendation, theophylline discontinued. Patient will continue on 5 more days of by mouth Levaquin to complete a seven-day course for this pneumonia Active Problems:  HTN (hypertension): Continue home meds  Diabetes mellitus type 2, uncontrolled with peripheral neuropathy: Continue Lantus, scheduled NovoLog plus sliding scale plus Neurontin. With sodium Medrol, CBGs even more elevated and patient required increased dose of Lantus and sliding scale  Hypertension: Continue home meds  Morbid obesity: Patient meets criteria with BMI greater  than 40.      09/16/2014 f/u ov/Jencarlo Bonadonna re: psueudoasthma vs asthma back on theoph/brethine and pred 40  Chief Complaint  Patient presents with  . HFU    Pt states that her breathing has improved some, but not back to baseline. SHe is still coughing up yellow sputum.    using neb saba avg twice  daily, feels theoph helps with "congestion" but able to do adls ok   >d/c theophylline/breathine   09/26/2014  Follow up and Med Review (Asthma/Steroid dependent)  Interpreter present for today's visit  patient does understand limited English, husband reads and understands more English Patient reports that she's had severe asthma as a child and has been on and off steroids dose of her life She is currently on prednisone 40 mg. Patient has been in the [place] for around 6-7 months Previously treated in [place]. She says she's had several hospitalizations. Most recent hospitalization was 2 weeks ago, she is starting to feel somewhat improved. Patient was seen in office last week. Her theophylline and Brethine was stopped. Patient has brought all of her medications in today for review We reviewed all her medications organize them into a medication count with patient education Patient is on multiple medications from [place] and locally. She is taking both budesonide and Dulera. Patient has had multiple steroid related complications including cataracts, compression fx and  Diabetes. She is currently being evaluated for right arm swelling. She has MRI and 2-D echo pending. She was treated with doxycycline. She denies any chest pain, orthopnea, PND, leg swelling, abdominal pain, nausea, vomiting. Does not have any PFTs on record. rec Stop Budesonide neb  Decrease Prednisone 46m 2 tabs alternating with 1.5 tabs for 2 weeks then 1.5 tabs daily for 2 weeks and then 1 tab daily and hold at this dose.  Follow med calendar closely and bring to each visit.     11/03/2014 f/u ov/Sophiana Milanese re: dtca asthma/ no rx today/ pred still at 30 mg  Chief Complaint  Patient presents with  . Follow-up    PFT done today. Pt states that her cough has resolved. Her breathing is some better. No new co's today.   still badly misunderstanding how to take her meds / unable to doing meaningful or accurate med rec    >>decrease pred 20     11/17/2014 Follow up : Difficult to control Asthma-steroid dependent /NP ov Interpreter is present for today's visit.  We reviewed all her medications organize them into a medication calendar with patient education It appears the patient is taking her medications correctly. Last visit. Patient's prednisone was decreased to 20 mg daily She has not had a flare of cough, wheezing or shortness of breath since last visit. rec In 2 weeks on 12/01/14 decrease Prednisone 230malternating 1015maily until seen back in offce.  Follow med calendar closely and bring to each visit.  Please contact office for sooner follow up if symptoms do not improve or worsen or seek emergency care     12/15/2014 f/u ov/Clever Geraldo re: chronic asthma/ pseudoasthma / brought med calendar in [employer] [employer]  Chief Complaint  Patient presents with  . Follow-up    Pt states still feeling SOB especially with movement. Denies fever, cough, or chest tightness   on prednisone 20 mg  Plus neb 3 tid including am of ov due to variable sob day >>noct  rec Plan A = automatic = dulera 100 Take 2 puffs first thing in am and then  another 2 puffs about 12 hours later.  Work on inhaler technique:  relax and gently blow all the way out then take a nice smooth deep breath back in, triggering the inhaler at same time you start breathing in.  Hold for up to 5 seconds if you can.  Rinse and gargle with water when done Plan B = backup Only use your albuterol [place] as a rescue medication  Plan C = neb albuterol, only use it if try B first and it doesn't work Continue  prednisone to 20 mg daily      01/12/2015 f/u ov/Carling Liberman re: asthma vs pseudoasthma/just on physiologic steroids per Dr Buddy Duty plus dulera 100 2bid Chief Complaint  Patient presents with  . Follow-up    Pt states that her breathing is doing well. She is using albuterol inhaler and neb both 2 x daily on average.     More day than noct events / having choking  episodes more than wheezing  Concern re tachycardia by primary care so metaprolol rec but she never started it.  >>no changes   02/17/2015 NP Follow up and Med review : asthma vs pseudoasthma/just on physilogic steroids per Dr Buddy Duty plus dulera 100 2bid Patient is accompanied by an interpreter Patient returns for a 4 week follow-up and medication review We reviewed all her medications organize them into a medication count with patient education Appears to be taking her medications correctly Patient says overall her breathing has been stable. She had a couple nights where she has some intermittent wheezing but this has improved. She appears the been changed to hydrocortisone. However, was unable to tolerate this and is now back on prednisone 10 mg daily. rec  Follow med calendar closely and bring to each visit.    03/31/2015 f/u ov/Natassia Guthridge re: dtc asthma vs vcd / has med calendar but not using it correctly / maint on dulera 100 2bid / 5 mg daily pred Chief Complaint  Patient presents with  . Follow-up    asthma: tightness in chest.  some cough, but not much. hot weather making SOB worse.  sore throat, both ears hurt.   last night took hfa sab and 2 h later the neb saba  On avg taking saba hfa  sev times a day and neb sev times a day also  Last rx 4 h prior to OV  and feels she could use it again now for sob and chest tight >>no changes   05/12/2015 Follow up : Severe Asthma -steroid dependent Pt returns for 1 month follow up .  Says she is doing well overall .  Feels better when temp are cooler  Cough, congestion and wheezing are less.  Remains on prednisone 79m daily  Without flare of wheezing /dyspea.  Patient has had multiple steroid related complications including cataracts, compression fx and  Diabetes. Over last 6 months has been tapered slowly down from high dose steroids to prednisone 5833mdaily  Remains on prednisone 33m58maily  Without flare of wheezing /dyspea.  Previously on  Advair, brethine and theophylline which were stopped over 6 months ago, now [facility] good control and no ER visits over last 6 months .  We reviewed all her meds and updated her very complex medication regimen with pt education  She says she understands and reads english with family at home that speaks/reads english.  Has medicaid for insurance which meds are affordable. She is married with no kids.  rec Follow med calendar closely and  bring to each visit.  Follow up Dr. Melvyn Novas  In 2-3 months and As needed    07/13/2015  f/u ov/Maeola Mchaney re: asthma vs vcd  On dulera 200 and pred 37m daily  Chief Complaint  Patient presents with  . Follow-up    Pt states breathing is "so, so". She had to increase the Dulera from 100 mcg to 200 mcg approx 2 wks ago, and she is using albuterol inhaler 3 x per day on average. She has been using neb every night.    Over using saba the same even on higher dose of dulera but more hoarse  No obvious day to day or daytime variability or assoc excess sputum production or cp or chest tightness, subjective wheeze or overt sinus or hb symptoms. No unusual exp hx or h/o childhood pna/ asthma or knowledge of premature birth.  Sleeping ok without nocturnal  or early am exacerbation  of respiratory  c/o's or need for noct saba. Also denies any obvious fluctuation of symptoms with weather or environmental changes or other aggravating or alleviating factors except as outlined above   Current Medications, Allergies, Complete Past Medical History, Past Surgical History, Family History, and Social History were reviewed in [employer].  ROS  The following are not active complaints unless bolded sore throat, dysphagia, dental problems, itching, sneezing,  nasal congestion or excess/ purulent secretions, ear ache,   fever, chills, sweats, unintended wt loss, classically pleuritic or exertional cp, hemoptysis,  orthopnea pnd or leg swelling, presyncope,  palpitations, abdominal pain, anorexia, nausea, vomiting, diarrhea  or change in bowel or bladder habits, change in stools or urine, dysuria,hematuria,  rash, arthralgias, visual complaints, headache, numbness, weakness or ataxia or problems with walking or coordination,  change in mood/affect or memory.                 Objective:   Physical Exam   Mildly hoarse  very cushingnoid amb hispanic female nad   01/12/2015  186 > 177 02/17/2015 >  03/31/2015  181 >180 >180 05/12/2015 > 07/13/2015   182     Vital signs reviewed       HEENT: nl dentition, turbinates, and orophanx. Nl external ear canals without cough reflex   NECK :  without JVD/Nodes/TM/ nl carotid upstrokes bilaterally   LUNGS: no acc muscle use, clear to A and P bilaterally without cough on insp or exp maneuvers -   CV:  RRR  no s3 or murmur or increase in P2, no edema   ABD:  soft and nontender with nl excursion in the supine position. No bruits or organomegaly, bowel sounds nl  MS:  warm without deformities, calf tenderness, cyanosis or clubbing Back brace  SKIN: warm and dry without lesions              Assessment & Plan:

## 2015-07-13 NOTE — Patient Instructions (Addendum)
Resume the dulera 100 Take 2 puffs first thing in am and then another 2 puffs about 12 hours later.   Only use your albuterol (proair) as a rescue medication to be used if you can't catch your breath by resting or doing a relaxed purse lip breathing pattern.  - The less you use it, the better it will work when you need it. - Ok to use up to 2 puffs  every 4 hours if you must but call for immediate appointment if use goes up over your usual need - Don't leave home without it !!  (think of it like the spare tire for your car)  See calendar for specific medication instructions and bring it back for each and every office visit for every healthcare provider you see.  Without it,  you may not receive the best quality medical care that we feel you deserve.  Only use the nebulized albuterol if you use the proair and it fails to relieve breathing difficulty  Use the tylenol #3 as per med calendar to suppress cough   You will note that the calendar groups together  your maintenance  medications that are timed at particular times of the day.  Think of this as your checklist for what your doctor has instructed you to do until your next evaluation to see what benefit  there is  to staying on a consistent group of medications intended to keep you well.  The other group at the bottom is entirely up to you to use as you see fit  for specific symptoms that may arise between visits that require you to treat them on an as needed basis.  Think of this as your action plan or "what if" list.   Separating the top medications from the bottom group is fundamental to providing you adequate care going forward.    See Tammy NP  In 3 months  with all your medications, even over the counter meds, separated in two separate bags, the ones you take no matter what vs the ones you stop once you feel better and take only as needed when you feel you need them.   Tammy  will generate for you a new user friendly medication calendar that  will put [place] all on the same page re: your medication use.

## 2015-07-15 ENCOUNTER — Encounter: Payer: Self-pay | Admitting: Podiatry

## 2015-07-15 ENCOUNTER — Ambulatory Visit (INDEPENDENT_AMBULATORY_CARE_PROVIDER_SITE_OTHER): Payer: Medicaid Other | Admitting: Podiatry

## 2015-07-15 ENCOUNTER — Encounter: Payer: Self-pay | Admitting: Family Medicine

## 2015-07-15 DIAGNOSIS — B351 Tinea unguium: Secondary | ICD-10-CM | POA: Diagnosis not present

## 2015-07-15 DIAGNOSIS — M79676 Pain in unspecified toe(s): Secondary | ICD-10-CM

## 2015-07-15 NOTE — Patient Instructions (Signed)
Purchase a soft rubbery shoe insole with a metatarsal raise Suggest either a walking or running style shoe to place the insole and Diabetes and Foot Care Diabetes may cause you to have problems because of poor blood supply (circulation) to your feet and legs. This may cause the skin on your feet to become thinner, break easier, and heal more slowly. Your skin may become dry, and the skin may peel and crack. You may also have nerve damage in your legs and feet causing decreased feeling in them. You may not notice minor injuries to your feet that could lead to infections or more serious problems. Taking care of your feet is one of the most important things you can do for yourself.  HOME CARE INSTRUCTIONS  Wear shoes at all times, even in the house. Do not go barefoot. Bare feet are easily injured.  Check your feet daily for blisters, cuts, and redness. If you cannot see the bottom of your feet, use a mirror or ask someone for help.  Wash your feet with warm water (do not use hot water) and mild soap. Then pat your feet and the areas between your toes until they are completely dry. Do not soak your feet as this can dry your skin.  Apply a moisturizing lotion or petroleum jelly (that does not contain alcohol and is unscented) to the skin on your feet and to dry, brittle toenails. Do not apply lotion between your toes.  Trim your toenails straight across. Do not dig under them or around the cuticle. File the edges of your nails with an emery board or nail file.  Do not cut corns or calluses or try to remove them with medicine.  Wear clean socks or stockings every day. Make sure they are not too tight. Do not wear knee-high stockings since they may decrease blood flow to your legs.  Wear shoes that fit properly and have enough cushioning. To break in new shoes, wear them for just a few hours a day. This prevents you from injuring your feet. Always look in your shoes before you put them on to be sure  there are no objects inside.  Do not cross your legs. This may decrease the blood flow to your feet.  If you find a minor scrape, cut, or break in the skin on your feet, keep it and the skin around it clean and dry. These areas may be cleansed with mild soap and water. Do not cleanse the area with peroxide, alcohol, or iodine.  When you remove an adhesive bandage, be sure not to damage the skin around it.  If you have a wound, look at it several times a day to make sure it is healing.  Do not use heating pads or hot water bottles. They may burn your skin. If you have lost feeling in your feet or legs, you may not know it is happening until it is too late.  Make sure your health care provider performs a complete foot exam at least annually or more often if you have foot problems. Report any cuts, sores, or bruises to your health care provider immediately. SEEK MEDICAL CARE IF:   You have an injury that is not healing.  You have cuts or breaks in the skin.  You have an ingrown nail.  You notice redness on your legs or feet.  You feel burning or tingling in your legs or feet.  You have pain or cramps in your legs and feet.  Your legs or feet are numb.  Your feet always feel cold. SEEK IMMEDIATE MEDICAL CARE IF:   There is increasing redness, swelling, or pain in or around a wound.  There is a red line that goes up your leg.  Pus is coming from a wound.  You develop a fever or as directed by your health care provider.  You notice a bad smell coming from an ulcer or wound.   This information is not intended to replace advice given to you by your health care provider. Make sure you discuss any questions you have with your health care provider.   Document Released: 08/26/2000 Document Revised: 05/01/2013 Document Reviewed: 02/05/2013 Elsevier Interactive Patient Education [employer].

## 2015-07-15 NOTE — Progress Notes (Signed)
Patient ID: Raven Turner, female   DOB: September 17, 1970, 44 y.o.   MRN: [id number]    subjective:  this patient presents for a schedule visit with a complaint of painful  Toenails second right and left hallux   also complaining of pain plantar fifth third fourth MPJ left with weightbearing   Objective:  patient presents with Spanish interpreter in room with husband present as well   no open skin lesions bilaterally  mild palpable tenderness plantar third fourth MPJ left without any palpable lesions   the left hallux toenail and second right toenail are hypertrophic , deformed and tender direct palpation   Assessment:  diabetic   symptomatic mycotic toenails 2   metatarsalgia left   Plan:  debridement toenails 2 mechanically electrical without any bleeding  patient advised to purchase athletic style shoes with a soft insert with a metatarsal raise wear inside the athletic style shoe    reappoint 3 months

## 2015-07-17 ENCOUNTER — Encounter: Payer: Self-pay | Admitting: Family Medicine

## 2015-07-17 ENCOUNTER — Encounter: Payer: Self-pay | Admitting: Internal Medicine

## 2015-07-17 ENCOUNTER — Ambulatory Visit: Payer: Medicaid Other | Attending: Family Medicine | Admitting: Family Medicine

## 2015-07-17 VITALS — BP 127/78 | HR 89 | Temp 98.2°F | Resp 16 | Ht <= 58 in | Wt 183.0 lb

## 2015-07-17 DIAGNOSIS — R1012 Left upper quadrant pain: Secondary | ICD-10-CM | POA: Insufficient documentation

## 2015-07-17 DIAGNOSIS — IMO0001 Reserved for inherently not codable concepts without codable children: Secondary | ICD-10-CM

## 2015-07-17 DIAGNOSIS — R21 Rash and other nonspecific skin eruption: Secondary | ICD-10-CM

## 2015-07-17 DIAGNOSIS — L298 Other pruritus: Secondary | ICD-10-CM | POA: Insufficient documentation

## 2015-07-17 DIAGNOSIS — E1165 Type 2 diabetes mellitus with hyperglycemia: Secondary | ICD-10-CM | POA: Insufficient documentation

## 2015-07-17 HISTORY — DX: Rash and other nonspecific skin eruption: R21

## 2015-07-17 HISTORY — DX: Left upper quadrant pain: R10.12

## 2015-07-17 LAB — GLUCOSE, POCT (MANUAL RESULT ENTRY): POC Glucose: 207 mg/dl — AB (ref 70–99)

## 2015-07-17 MED ORDER — TRIAMCINOLONE ACETONIDE 0.5 % EX OINT: 1.0000 "application " | TOPICAL_OINTMENT | Freq: Two times a day (BID) | CUTANEOUS | Status: DC

## 2015-07-17 NOTE — Assessment & Plan Note (Signed)
Body mass index is 38.13    Lab Results  Component Value Date   TSH 2.626 09/08/2014     Contributing to gerd tendency/ doe/reviewed the need and the process to achieve and maintain neg calorie balance > defer f/u primary care including intermittently monitoring thyroid status.

## 2015-07-17 NOTE — Assessment & Plan Note (Signed)
LUQ pain is musculoskeletal pain Continue to wear brace Try robaxin or tylenol #3 during the day as well. Core strengthening

## 2015-07-17 NOTE — Assessment & Plan Note (Signed)
09/05/2014  try dulera 100 2bid and off advair   - 09/16/2014 off theoph and brethine    - 11/03/14  PFTs p am dulera 100 on 30 mg prednisone  FEV1 1.56(73%) ratio 85 =  No obst, including fef 25-75%, only abn is reduction in ERV -med calendar 11/17/2014 , 02/17/2015  - 12/2014 changed to physiologic steroids  - 01/12/2015 p extensive coaching HFA effectiveness = 75%  - spirometry 03/31/15 1.48 (62%) ratio 82  p am dulera and while symptomatic requesting saba  05/12/2015 med calendar  - 07/13/2015  extensive coaching HFA effectiveness =     75% from as baseline of < 50 (ti way too short)    Should do just as well on dulera 100 2 bid with less irritation of the upper airway cough

## 2015-07-17 NOTE — Progress Notes (Signed)
Patient ID: Raven Turner, female   DOB: May 02, 1971, 45 y.o.   MRN: [id number]   Subjective:  Patient ID: Raven Turner, female    DOB: 06/21/1971  Age: 44 y.o. MRN: [id number] Spanish interpreter #  [phone] CC: Rash and Abdominal Pain   HPI Raven Turner presents for rash/LUQ pain    1. Rash: x 2 weeks. inner thigh. Some days better. Some days worse. No ithcing. No fever. Skin is dry.   2. LUQ pain: x 7-10 days. No nausea, emesis, fever, stool changes. Taking prilosec during the day and nexium at night. Wearing back brace.   Social History  Substance Use Topics  . Smoking status: Never Smoker   . Smokeless tobacco: Never Used  . Alcohol Use: No   Outpatient Prescriptions Prior to Visit  Medication Sig Dispense Refill  . acetaminophen-codeine (TYLENOL #3) 300-30 MG per tablet Take 1-2 tablets by mouth at bedtime as needed for moderate pain. (Patient taking differently: Take 1-2 tablets by mouth at bedtime as needed (cough and severe pain). ) 60 tablet 2  . albuterol (PROAIR HFA) 108 (90 BASE) MCG/ACT inhaler 2 puffs every 4 hours as needed only  For wheezing/Shortness of breath (plan B) (Patient taking differently: 2 puffs every 4 hours as needed only  For wheezing/Shortness of breath )(Plan B))) 1 Inhaler 5  . albuterol (PROVENTIL) (2.5 MG/3ML) 0.083% nebulizer solution Take 3 mLs (2.5 mg total) by nebulization every 4 (four) hours as needed for wheezing or shortness of breath (plan C). (Patient taking differently: Take 2.5 mg by nebulization every 4 (four) hours as needed for wheezing or shortness of breath (((Plan C))). ) 75 mL 3  . aspirin 325 MG tablet Take 1 tablet (325 mg total) by mouth daily. (Patient taking differently: Take 325 mg by mouth every morning. ) 30 tablet 3  . atorvastatin (LIPITOR) 20 MG tablet Take 1 tablet (20 mg total) by mouth daily. (Patient taking differently: Take 20 mg by mouth at bedtime. ) 30 tablet 11  . Blood Glucose Monitoring Suppl  (ACCU-CHEK AVIVA PLUS) W/DEVICE KIT Used as directed 1 kit 0  . cholecalciferol (VITAMIN D) 1000 UNITS tablet Take 1 tablet (1,000 Units total) by mouth daily. (Patient taking differently: Take 1,000 Units by mouth every morning. ) 30 tablet 11  . clonazePAM (KLONOPIN) 0.5 MG tablet Take 1 tablet (0.5 mg total) by mouth 2 (two) times daily as needed (trouble sleeping and anxiety). 30 tablet 2  . cloNIDine (CATAPRES) 0.2 MG tablet Take 1 tablet (0.2 mg total) by mouth 3 (three) times daily. 90 tablet 11  . dextromethorphan-guaiFENesin (MUCINEX DM) 30-600 MG per 12 hr tablet Take 1 tablet by mouth 2 (two) times daily as needed (with flutter valve for cough, congestion.).     Marland Kitchen ferrous sulfate 325 (65 FE) MG tablet Take 1 tablet (325 mg total) by mouth daily with breakfast. 30 tablet 3  . FOLIC ACID PO Take 1 tablet by mouth every morning.    . furosemide (LASIX) 40 MG tablet Take 1 tablet (40 mg total) by mouth every morning. 30 tablet 2  . gabapentin (NEURONTIN) 400 MG capsule Take 2 capsules (800 mg total) by mouth 2 (two) times daily. 120 capsule 1  . glucose blood (RELION GLUCOSE TEST STRIPS) test strip 1 each by Other route 4 (four) times daily. ICD code E11.65 400 each 3  . insulin aspart (NOVOLOG) 100 UNIT/ML injection Inject 80 Units into the skin 3 (three) times daily before  meals. 3 vial 3  . Insulin Glargine (LANTUS SOLOSTAR) 100 UNIT/ML Solostar Pen Inject 80 Units into the skin at bedtime. 5 pen 11  . Insulin Pen Needle (B-D ULTRAFINE III SHORT PEN) 31G X 8 MM MISC 1 application by Does not apply route daily. 100 each 3  . Insulin Syringe-Needle U-100 (BD INSULIN SYRINGE ULTRAFINE) 31G X 15/64" 1 ML MISC 1 each by Does not apply route 3 (three) times daily. 300 each 3  . Lancet Devices (ACCU-CHEK SOFTCLIX) lancets Use as instructed 1 each 0  . losartan (COZAAR) 100 MG tablet Take 1 tablet (100 mg total) by mouth daily. (Patient taking differently: Take 100 mg by mouth every morning. ) 30  tablet 3  . metFORMIN (GLUCOPHAGE) 500 MG tablet Take 1 tablet (500 mg total) by mouth 2 (two) times daily with a meal. 60 tablet 11  . methocarbamol (ROBAXIN) 750 MG tablet Take 1 tablet (750 mg total) by mouth every 8 (eight) hours as needed for muscle spasms. 90 tablet 2  . mometasone-formoterol (DULERA) 100-5 MCG/ACT AERO Take 2 puffs first thing in am and then another 2 puffs about 12 hours later. 1 Inhaler 11  . montelukast (SINGULAIR) 10 MG tablet Take 1 tablet (10 mg total) by mouth at bedtime. 30 tablet 11  . omeprazole (PRILOSEC) 20 MG capsule Take 1 capsule (20 mg total) by mouth daily before breakfast. 90 capsule 1  . [place] Calcium 500 MG TABS Take 0.4 tablets (500 mg total) by mouth 2 (two) times daily after a meal. 60 tablet 11  . PARoxetine (PAXIL) 20 MG tablet Take 20 mg by mouth every morning.     . predniSONE (DELTASONE) 1 MG tablet Take 3 mg by mouth daily with breakfast.     . ranitidine (ZANTAC) 150 MG tablet Take 2 tablets (300 mg total) by mouth at bedtime. 60 tablet 5  . ReliOn Ultra Thin Lancets MISC 1 each by Does not apply route 4 (four) times daily. ICD code E11.65 400 each 3  . Respiratory Therapy Supplies (FLUTTER) DEVI Use as directed 1 each 0  . verapamil (VERELAN PM) 360 MG 24 hr capsule Take 1 capsule (360 mg total) by mouth every morning. 30 capsule 11   Facility-Administered Medications Prior to Visit  Medication Dose Route Frequency Provider Last Rate Last Dose  . DOBUTamine (DOBUTREX) 1,000 mcg/mL in dextrose 5% 250 mL infusion  30 mcg/kg/min Intravenous Continuous Mihai Croitoru, MD 145.3 mL/hr at 04/22/15 1300 30 mcg/kg/min at 04/22/15 1300    ROS Review of Systems  Constitutional: Negative for fever and chills.  Eyes: Negative for visual disturbance.  Respiratory: Negative for shortness of breath.   Cardiovascular: Negative for chest pain.  Gastrointestinal: Positive for abdominal pain. Negative for blood in stool.  Musculoskeletal: Negative  for back pain and arthralgias.  Skin: Positive for rash.  Allergic/Immunologic: Negative for immunocompromised state.  Hematological: Negative for adenopathy. Does not bruise/bleed easily.  Psychiatric/Behavioral: Negative for suicidal ideas and dysphoric mood.    Objective:  BP 127/78 mmHg  Pulse 89  Temp(Src) 98.2 F (36.8 C) (Oral)  Resp 16  Ht $R'4\' 10"'lJ$  (1.473 m)  Wt 183 lb (83.008 kg)  BMI 38.26 kg/m2  SpO2 97%  LMP 06/22/2015  BP/Weight 07/17/2015 07/13/2015 09/81/1914  Systolic BP 782 956 -  Diastolic BP 78 72 -  Wt. (Lbs) 183 182.4 180  BMI 38.26 38.13 37.63    Physical Exam  Constitutional: She is oriented to person, place, and time.  She appears well-developed and well-nourished. No distress.  HENT:  Head: Normocephalic and atraumatic.  Cardiovascular: Normal rate, regular rhythm, normal heart sounds and intact distal pulses.   Pulmonary/Chest: Effort normal and breath sounds normal.  Abdominal: Soft. Bowel sounds are normal. She exhibits no distension and no mass. There is tenderness (tenderness in LUQ exacerbated by abdominal muscle flexion ). There is no rebound and no guarding.  Musculoskeletal: She exhibits no edema.  Neurological: She is alert and oriented to person, place, and time.  Skin: Skin is warm and dry. No rash noted.  1x2 cm patch of xerotic skin with blanching erythema on L upper medial thigh   Psychiatric: She has a normal mood and affect.     Assessment & Plan:   Problem List Items Addressed This Visit    Diabetes mellitus type 2, uncontrolled ([place]) - Primary (Chronic)   Relevant Orders   POCT glucose (manual entry) (Completed)   LUQ abdominal pain   Skin rash   Relevant Medications   triamcinolone ointment (KENALOG) 0.5 %      No orders of the defined types were placed in this encounter.    Follow-up: No Follow-up on file.   Boykin Nearing MD

## 2015-07-17 NOTE — Assessment & Plan Note (Signed)
Rash suspect dry skin with mild dermatitis Short course of topical steroid

## 2015-07-17 NOTE — Progress Notes (Signed)
C/C Rash on leg LUQ pain x1 week  Pain scale 3 No hx tobacco

## 2015-07-17 NOTE — Assessment & Plan Note (Addendum)
-   Trial off advair  09/05/2014 >>>  - trial off theoph 09/17/2014 and added flutter  - rec reduce dulera to 100 2 bid 07/13/2015   Continue to feel her upper airway symptoms predominate overy any asthma she might have so rec reduce the dulera to 100 2bid and suppress excess cough or throat clearing    I had an extended discussion with the patient via interpreter reviewing all relevant studies completed to date and  lasting 15 to 20 minutes of a 25 minute visit    Each maintenance medication was reviewed in detail including most importantly the difference between maintenance and prns and under what circumstances the prns are to be triggered using an action plan format that is not reflected in the computer generated alphabetically organized AVS but trather by a customized med calendar that reflects the AVS meds with confirmed 100% correlation.   Please see instructions for details which were reviewed in writing and the patient given a copy highlighting the part that I personally wrote and discussed at today's ov.

## 2015-07-17 NOTE — Patient Instructions (Signed)
Raven Turner was seen today for rash and abdominal pain.  Diagnoses and all orders for this visit:  Uncontrolled diabetes mellitus type 2 without complications, unspecified long term insulin use status (H[facility]) -     POCT glucose (manual entry)  Skin rash -     triamcinolone ointment (KENALOG) 0.5 %; Apply 1 application topically 2 (two) times daily.  LUQ abdominal pain  LUQ pain is musculoskeletal pain Continue to wear brace Try robaxin or tylenol #3 during the day as well. Core strengthening   F/u in 2 months for diabetes, sooner if needed  Dr. Adrian Blackwater

## 2015-07-23 ENCOUNTER — Ambulatory Visit (INDEPENDENT_AMBULATORY_CARE_PROVIDER_SITE_OTHER): Payer: Medicaid Other | Admitting: Neurology

## 2015-07-23 ENCOUNTER — Encounter: Payer: Self-pay | Admitting: Neurology

## 2015-07-23 ENCOUNTER — Other Ambulatory Visit: Payer: Self-pay | Admitting: Family Medicine

## 2015-07-23 VITALS — BP 118/68 | HR 80 | Ht <= 58 in | Wt 184.3 lb

## 2015-07-23 DIAGNOSIS — E0842 Diabetes mellitus due to underlying condition with diabetic polyneuropathy: Secondary | ICD-10-CM | POA: Diagnosis not present

## 2015-07-23 DIAGNOSIS — R202 Paresthesia of skin: Secondary | ICD-10-CM

## 2015-07-23 DIAGNOSIS — F411 Generalized anxiety disorder: Secondary | ICD-10-CM

## 2015-07-23 MED ORDER — CLONAZEPAM 0.5 MG PO TABS: 0.5000 mg | ORAL_TABLET | Freq: Two times a day (BID) | ORAL | Status: DC | PRN

## 2015-07-23 MED ORDER — OMEPRAZOLE 20 MG PO CPDR: 20.0000 mg | DELAYED_RELEASE_CAPSULE | Freq: Every day | ORAL | Status: DC

## 2015-07-23 NOTE — Progress Notes (Signed)
[facility] Clinic Note - Initial Visit   Date: 07/23/2015  Raven Turner MRN: [id number] DOB: Dec 16, 1970   Dear Dr. Adrian Blackwater:  Thank you for your kind referral of Raven Turner for consultation of bilateral hand numbness. Although her history is well known to you, please allow [place] to reiterate it for the purpose of our medical record. The patient was accompanied to the clinic by Spanish interpretor who also provides collateral information.     History of Present Illness: Raven Turner is a 44 y.o. right-handed Hispanic female with insulin-dependent diabetes mellitus, hypertension, CODP, steroid-induced osteoporosis, cushing syndrome, depression, asthma,  presenting for evaluation of bilateral hand paresthesias.    Starting around September she noticed intermittent numbness/tingling of the hands, worse on the right. It is improved if she is not using her hands and worse with activity.  She has difficulty holding objects and with fine motor movement and attributes this to hand weakness.  Denies neck pain. She tried wrist splints, but feels that it aggravates her wrists.    She denies any numbness/tingling of the feet.    She suffered a fall in [place] in 2014 and had compression fracture of the spine for which she wear a thoracic brace and is followed by Dr. Lynann Bologna. Since the fall, she has been walking with a cane.    She also reports being diagnosed with neuropathy some years ago and takes gabapentin 843m BID for this.   Out-side paper records, electronic medical record, and images have been reviewed where available and summarized as:  Lab Results  Component Value Date   HGBA1C 7.80 06/24/2015   Lab Results  Component Value Date   TSH 2.626 09/08/2014    Past Medical History  Diagnosis Date  . Asthma   . Cushing syndrome ([facility] 2013   . Depression   . Anemia   . Osteopenia 11/19/2013    Dx with osteopenia in lumbar vertebra with  partial compression of L1  . Diabetes ([facility] 2013   . Diabetes mellitus with neuropathy ([facility]   . HTN (hypertension) 2005   . COPD (chronic obstructive pulmonary disease) ([facility] 2013  . Compression fracture     Past Surgical History  Procedure Laterality Date  . Cataract extraction Bilateral 2014  . Vaginal hysterectomy       Medications:  Outpatient Encounter Prescriptions as of 07/23/2015  Medication Sig Note  . acetaminophen-codeine (TYLENOL #3) 300-30 MG per tablet Take 1-2 tablets by mouth at bedtime as needed for moderate pain. (Patient taking differently: Take 1-2 tablets by mouth at bedtime as needed (cough and severe pain). )   . albuterol (PROAIR HFA) 108 (90 BASE) MCG/ACT inhaler 2 puffs every 4 hours as needed only  For wheezing/Shortness of breath (plan B) (Patient taking differently: 2 puffs every 4 hours as needed only  For wheezing/Shortness of breath )(Plan B)))   . albuterol (PROVENTIL) (2.5 MG/3ML) 0.083% nebulizer solution Take 3 mLs (2.5 mg total) by nebulization every 4 (four) hours as needed for wheezing or shortness of breath (plan C). (Patient taking differently: Take 2.5 mg by nebulization every 4 (four) hours as needed for wheezing or shortness of breath (((Plan C))). )   . aspirin 325 MG tablet Take 1 tablet (325 mg total) by mouth daily. (Patient taking differently: Take 325 mg by mouth every morning. )   . atorvastatin (LIPITOR) 20 MG tablet Take 1 tablet (20 mg total) by mouth daily. (Patient taking differently: Take 20  mg by mouth at bedtime. )   . Blood Glucose Monitoring Suppl (ACCU-CHEK AVIVA PLUS) W/DEVICE KIT Used as directed   . cholecalciferol (VITAMIN D) 1000 UNITS tablet Take 1 tablet (1,000 Units total) by mouth daily. (Patient taking differently: Take 1,000 Units by mouth every morning. )   . clonazePAM (KLONOPIN) 0.5 MG tablet Take 1 tablet (0.5 mg total) by mouth 2 (two) times daily as needed (trouble sleeping and anxiety).   . cloNIDine (CATAPRES)  0.2 MG tablet Take 1 tablet (0.2 mg total) by mouth 3 (three) times daily.   Marland Kitchen dextromethorphan-guaiFENesin (MUCINEX DM) 30-600 MG per 12 hr tablet Take 1 tablet by mouth 2 (two) times daily as needed (with flutter valve for cough, congestion.).    Marland Kitchen ferrous sulfate 325 (65 FE) MG tablet Take 1 tablet (325 mg total) by mouth daily with breakfast.   . FOLIC ACID PO Take 1 tablet by mouth every morning.   . furosemide (LASIX) 40 MG tablet Take 1 tablet (40 mg total) by mouth every morning.   . gabapentin (NEURONTIN) 400 MG capsule Take 2 capsules (800 mg total) by mouth 2 (two) times daily.   Marland Kitchen glucose blood (RELION GLUCOSE TEST STRIPS) test strip 1 each by Other route 4 (four) times daily. ICD code E11.65   . insulin aspart (NOVOLOG) 100 UNIT/ML injection Inject 80 Units into the skin 3 (three) times daily before meals.   . Insulin Glargine (LANTUS SOLOSTAR) 100 UNIT/ML Solostar Pen Inject 80 Units into the skin at bedtime.   . Insulin Pen Needle (B-D ULTRAFINE III SHORT PEN) 31G X 8 MM MISC 1 application by Does not apply route daily.   . Insulin Syringe-Needle U-100 (BD INSULIN SYRINGE ULTRAFINE) 31G X 15/64" 1 ML MISC 1 each by Does not apply route 3 (three) times daily.   Elmore Guise Devices (ACCU-CHEK SOFTCLIX) lancets Use as instructed   . losartan (COZAAR) 100 MG tablet Take 1 tablet (100 mg total) by mouth daily. (Patient taking differently: Take 100 mg by mouth every morning. )   . metFORMIN (GLUCOPHAGE) 500 MG tablet Take 1 tablet (500 mg total) by mouth 2 (two) times daily with a meal.   . methocarbamol (ROBAXIN) 750 MG tablet Take 1 tablet (750 mg total) by mouth every 8 (eight) hours as needed for muscle spasms.   . mometasone-formoterol (DULERA) 100-5 MCG/ACT AERO Take 2 puffs first thing in am and then another 2 puffs about 12 hours later.   . montelukast (SINGULAIR) 10 MG tablet Take 1 tablet (10 mg total) by mouth at bedtime.   Marland Kitchen omeprazole (PRILOSEC) 20 MG capsule Take 1 capsule (20 mg  total) by mouth daily before breakfast.   . [place] Calcium 500 MG TABS Take 0.4 tablets (500 mg total) by mouth 2 (two) times daily after a meal.   . PARoxetine (PAXIL) 20 MG tablet Take 20 mg by mouth every morning.    . predniSONE (DELTASONE) 1 MG tablet Take 3 mg by mouth daily with breakfast.  05/27/2015: Per Dr. Buddy Duty, endocrinologist, working on a taper   . ranitidine (ZANTAC) 150 MG tablet Take 2 tablets (300 mg total) by mouth at bedtime.   . ReliOn Ultra Thin Lancets MISC 1 each by Does not apply route 4 (four) times daily. ICD code E11.65   . Respiratory Therapy Supplies (FLUTTER) DEVI Use as directed   . triamcinolone ointment (KENALOG) 0.5 % Apply 1 application topically 2 (two) times daily.   . verapamil (VERELAN  PM) 360 MG 24 hr capsule Take 1 capsule (360 mg total) by mouth every morning.    Facility-Administered Encounter Medications as of 07/23/2015  Medication  . DOBUTamine (DOBUTREX) 1,000 mcg/mL in dextrose 5% 250 mL infusion     Allergies:  Allergies  Allergen Reactions  . Shellfish Allergy Anaphylaxis  . Septra [Sulfamethoxazole-Trimethoprim] Rash and Other (See Comments)    Facial redness with pruritus   . Hydrocortisone Rash    Family History: Family History  Problem Relation Age of Onset  . Diabetes Mother   . Stroke Father   . Asthma Father     Social History: Social History  Substance Use Topics  . Smoking status: Never Smoker   . Smokeless tobacco: Never Used  . Alcohol Use: No   Social History   Social History Narrative    Review of Systems:  CONSTITUTIONAL: No fevers, chills, night sweats, or weight loss.   EYES: No visual changes or eye pain ENT: No hearing changes.  No history of nose bleeds.   RESPIRATORY: No cough, +wheezing and shortness of breath.   CARDIOVASCULAR: Negative for chest pain, and palpitations.   GI: Negative for abdominal discomfort, blood in stools or black stools.  No recent change in bowel habits.   GU:  No  history of incontinence.   MUSCLOSKELETAL: +history of joint pain or swelling.  No myalgias.   SKIN: Negative for lesions, +rash, and itching.   HEMATOLOGY/ONCOLOGY: Negative for prolonged bleeding, bruising easily, and swollen nodes.  No history of cancer.   ENDOCRINE: Negative for cold or heat intolerance, polydipsia or goiter.   PSYCH:  No depression or anxiety symptoms.   NEURO: As Above.   Vital Signs:  BP 118/68 mmHg  Pulse 80  Ht 4' 10" (1.473 m)  Wt 184 lb 5 oz (83.604 kg)  BMI 38.53 kg/m2  SpO2 97%  LMP 06/22/2015   General Medical Exam:   General:  Well appearing, comfortable, cushingoid, she is wearing thoracic brace.   Eyes/ENT: see cranial nerve examination.   Neck: No masses appreciated.  Full range of motion without tenderness.  No carotid bruits. Respiratory:  Wheezing present bilaterally upper chest    Cardiac:  Regular rate and rhythm, no murmur.   Extremities:  No deformities, edema, or skin discoloration.  Skin:  No rashes or lesions.  Neurological Exam: MENTAL STATUS including orientation to time, place, person, recent and remote memory, attention span and concentration, language, and fund of knowledge is normal.  Speech is not dysarthric.  CRANIAL NERVES: II:  No visual field defects.  Unremarkable fundi.   III-IV-VI: Pupils equal round and reactive to light.  Normal conjugate, extra-ocular eye movements in all directions of gaze.  No nystagmus.  No ptosis.   V:  Normal facial sensation.    VII:  Normal facial symmetry and movements.  No pathologic facial reflexes.  VIII:  Normal hearing and vestibular function.   IX-X:  Normal palatal movement.   XI:  Normal shoulder shrug and head rotation.   XII:  Normal tongue strength and range of motion, no deviation or fasciculation.  MOTOR:  No atrophy, fasciculations or abnormal movements.  No pronator drift.  Tone is normal.   Tinel's sign is negative at the wrist and elbow.  Right Upper Extremity:    Left  Upper Extremity:    Deltoid  5/5   Deltoid  5/5   Biceps  5/5   Biceps  5/5   Triceps  5/5   Triceps  5/5   Wrist extensors  5/5   Wrist extensors  5/5   Wrist flexors  5/5   Wrist flexors  5/5   Finger extensors  5/5   Finger extensors  5/5   Finger flexors  5/5   Finger flexors  5/5   Dorsal interossei  5-/5   Dorsal interossei  5-/5   Abductor pollicis  5/5   Abductor pollicis  5/5   Tone (Ashworth scale)  0  Tone (Ashworth scale)  0   Right Lower Extremity:    Left Lower Extremity:    Hip flexors  5/5   Hip flexors  5/5   Hip extensors  5/5   Hip extensors  5/5   Knee flexors  5/5   Knee flexors  5/5   Knee extensors  5/5   Knee extensors  5/5   Dorsiflexors  5/5   Dorsiflexors  5/5   Plantarflexors  5/5   Plantarflexors  5/5   Toe extensors  5/5   Toe extensors  5/5   Toe flexors  5/5   Toe flexors  5/5   Tone (Ashworth scale)  0  Tone (Ashworth scale)  0   MSRs:  Right                                                                 Left brachioradialis 3+  brachioradialis 3+  biceps 3+  biceps 3+  triceps 3+  triceps 3+  patellar 3+  patellar 3+  ankle jerk 2+  ankle jerk 2+  Hoffman no  Hoffman no  plantar response down  plantar response down   SENSORY:  Normal and symmetric perception of light touch, pinprick, vibration, and proprioception.  COORDINATION/GAIT: Normal finger-to- nose-finger and heel-to-shin.  Intact rapid alternating movements bilaterally.  Able to rise from a chair without using arms.  Gait wide-based assisted with cane and stable.   IMPRESSION: 1.  Bilateral hand paresthesias  - Exam is normal except for mild brisk reflexes ?cervical myelopathy, however with symmetric nature of symptoms cannot rule out diabetic peripheral neuropathy  - NCS/EMG of the upper extremities   - Continue gabapentin 856m BID  - Start occupational therapy  - Consider MRI cervical spine, if no improvement  2.  Diabetes mellitus, HbA1c 7.8  - Encouraged tight glycemic  control  - No evidence of neuropathy on exam, but she reports being diagnosed with and takes gabapentin 8021mBID   Return to clinic in 2-3 months.   The duration of this appointment visit was 40 minutes of face-to-face time with the patient.  Greater than 50% of this time was spent in counseling, explanation of diagnosis, planning of further management, and coordination of care.   Thank you for allowing me to participate in patient's care.  If I can answer any additional questions, I would be pleased to do so.    Sincerely,    Donika K. PaPosey ProntoDO

## 2015-07-23 NOTE — Patient Instructions (Addendum)
NCS/EMG of the upper extremities Return to clinic in 2-3 months

## 2015-08-04 ENCOUNTER — Telehealth: Payer: Self-pay | Admitting: *Deleted

## 2015-08-04 NOTE — Telephone Encounter (Signed)
Pt called for refills Advised to call pharmacy

## 2015-08-17 ENCOUNTER — Ambulatory Visit (INDEPENDENT_AMBULATORY_CARE_PROVIDER_SITE_OTHER): Payer: Medicaid Other | Admitting: Internal Medicine

## 2015-08-17 ENCOUNTER — Telehealth: Payer: Self-pay | Admitting: Family Medicine

## 2015-08-17 ENCOUNTER — Encounter: Payer: Self-pay | Admitting: Internal Medicine

## 2015-08-17 VITALS — BP 118/70 | HR 90 | Ht <= 58 in | Wt 185.0 lb

## 2015-08-17 DIAGNOSIS — R0602 Shortness of breath: Secondary | ICD-10-CM | POA: Diagnosis not present

## 2015-08-17 DIAGNOSIS — R0789 Other chest pain: Secondary | ICD-10-CM

## 2015-08-17 NOTE — Patient Instructions (Signed)
Your physician recommends that you continue on your current medications as directed. Please refer to the Current Medication list given to you today. Your physician recommends that you schedule a follow-up appointment as needed with Dr. Harrington Challenger.

## 2015-08-17 NOTE — Telephone Encounter (Signed)
Patient came into facility stating that she has a spot on her R leg and would like the nurse to see it, patient was offered an appointment with Dr. Lindell Noe on Wed but patient stated that she would only like to see Dr. Adrian Blackwater.   Patient was advised that Dr. Adrian Blackwater did not have anything available until the end of the month.  Please f/u with patient in regards to her concern.

## 2015-08-17 NOTE — Progress Notes (Signed)
Cardiology Office Note   Date:  08/17/2015   ID:  Raven Turner, DOB 1971-01-06, MRN [id number]  PCP:  Minerva Ends, MD  Cardiologist:   Dorris Carnes, MD   No chief complaint on file. F/U onf HTN     History of Present Illness: Raven Turner is a 44 y.o. female with a history of HTN, distolic dysfunction, DM and COPD/asthma and chest pressure (pleuritic) I saw her in [place]  I had been increasing her meds   She had some chest wall tenderness when seen  She also had some SOB and indigestion.  Dobutamine echo was normal . SInce seen she continues to have problems breathing  Says steroids are not helping much.         Current Outpatient Prescriptions  Medication Sig Dispense Refill  . acetaminophen-codeine (TYLENOL #3) 300-30 MG tablet Take 1-2 tablets by mouth at bedtime as needed for moderate pain (or cough).    Marland Kitchen albuterol (PROAIR HFA) 108 (90 BASE) MCG/ACT inhaler 2 puffs every 4 hours as needed only  For wheezing/Shortness of breath (plan B) (Patient taking differently: 2 puffs every 4 hours as needed only  For wheezing/Shortness of breath )(Plan B))) 1 Inhaler 5  . albuterol (PROVENTIL) (2.5 MG/3ML) 0.083% nebulizer solution Take 3 mLs (2.5 mg total) by nebulization every 4 (four) hours as needed for wheezing or shortness of breath (plan C). (Patient taking differently: Take 2.5 mg by nebulization every 4 (four) hours as needed for wheezing or shortness of breath (((Plan C))). ) 75 mL 3  . aspirin 325 MG tablet Take 1 tablet (325 mg total) by mouth daily. (Patient taking differently: Take 325 mg by mouth every morning. ) 30 tablet 3  . atorvastatin (LIPITOR) 20 MG tablet Take 1 tablet (20 mg total) by mouth daily. (Patient taking differently: Take 20 mg by mouth at bedtime. ) 30 tablet 11  . Blood Glucose Monitoring Suppl (ACCU-CHEK AVIVA PLUS) W/DEVICE KIT Used as directed 1 kit 0  . cholecalciferol (VITAMIN D) 1000 UNITS tablet Take 1 tablet (1,000 Units total)  by mouth daily. (Patient taking differently: Take 1,000 Units by mouth every morning. ) 30 tablet 11  . clonazePAM (KLONOPIN) 0.5 MG tablet Take 1 tablet (0.5 mg total) by mouth 2 (two) times daily as needed (trouble sleeping and anxiety). 30 tablet 2  . cloNIDine (CATAPRES) 0.2 MG tablet Take 1 tablet (0.2 mg total) by mouth 3 (three) times daily. 90 tablet 11  . dextromethorphan-guaiFENesin (MUCINEX DM) 30-600 MG per 12 hr tablet Take 1 tablet by mouth 2 (two) times daily as needed (with flutter valve for cough, congestion.).     Marland Kitchen ferrous sulfate 325 (65 FE) MG tablet Take 1 tablet (325 mg total) by mouth daily with breakfast. 30 tablet 3  . FOLIC ACID PO Take 1 tablet by mouth every morning.    . furosemide (LASIX) 40 MG tablet Take 1 tablet (40 mg total) by mouth every morning. 30 tablet 2  . gabapentin (NEURONTIN) 400 MG capsule Take 2 capsules (800 mg total) by mouth 2 (two) times daily. 120 capsule 1  . glucose blood (RELION GLUCOSE TEST STRIPS) test strip 1 each by Other route 4 (four) times daily. ICD code E11.65 400 each 3  . insulin aspart (NOVOLOG) 100 UNIT/ML injection Inject 80 Units into the skin 3 (three) times daily before meals. 3 vial 3  . Insulin Glargine (LANTUS SOLOSTAR) 100 UNIT/ML Solostar Pen Inject 80 Units into the skin  at bedtime. 5 pen 11  . Insulin Pen Needle (B-D ULTRAFINE III SHORT PEN) 31G X 8 MM MISC 1 application by Does not apply route daily. 100 each 3  . Insulin Syringe-Needle U-100 (BD INSULIN SYRINGE ULTRAFINE) 31G X 15/64" 1 ML MISC 1 each by Does not apply route 3 (three) times daily. 300 each 3  . Lancet Devices (ACCU-CHEK SOFTCLIX) lancets Use as instructed 1 each 0  . losartan (COZAAR) 100 MG tablet Take 1 tablet (100 mg total) by mouth daily. (Patient taking differently: Take 100 mg by mouth every morning. ) 30 tablet 3  . metFORMIN (GLUCOPHAGE) 500 MG tablet Take 1 tablet (500 mg total) by mouth 2 (two) times daily with a meal. 60 tablet 11  .  methocarbamol (ROBAXIN) 750 MG tablet Take 1 tablet (750 mg total) by mouth every 8 (eight) hours as needed for muscle spasms. 90 tablet 2  . mometasone-formoterol (DULERA) 100-5 MCG/ACT AERO Take 2 puffs first thing in am and then another 2 puffs about 12 hours later. 1 Inhaler 11  . montelukast (SINGULAIR) 10 MG tablet Take 1 tablet (10 mg total) by mouth at bedtime. 30 tablet 11  . omeprazole (PRILOSEC) 20 MG capsule Take 1 capsule (20 mg total) by mouth daily before breakfast. 90 capsule 1  . Oyster Shell Calcium 500 MG TABS Take 0.4 tablets (500 mg total) by mouth 2 (two) times daily after a meal. 60 tablet 11  . PARoxetine (PAXIL) 20 MG tablet Take 20 mg by mouth every morning.     . predniSONE (DELTASONE) 1 MG tablet Take 3 mg by mouth daily with breakfast.     . ranitidine (ZANTAC) 150 MG tablet Take 2 tablets (300 mg total) by mouth at bedtime. 60 tablet 5  . ReliOn Ultra Thin Lancets MISC 1 each by Does not apply route 4 (four) times daily. ICD code E11.65 400 each 3  . Respiratory Therapy Supplies (FLUTTER) DEVI Use as directed 1 each 0  . triamcinolone ointment (KENALOG) 0.5 % Apply 1 application topically 2 (two) times daily. 30 g 0  . verapamil (VERELAN PM) 360 MG 24 hr capsule Take 1 capsule (360 mg total) by mouth every morning. 30 capsule 11   No current facility-administered medications for this visit.   Facility-Administered Medications Ordered in Other Visits  Medication Dose Route Frequency Provider Last Rate Last Dose  . DOBUTamine (DOBUTREX) 1,000 mcg/mL in dextrose 5% 250 mL infusion  30 mcg/kg/min Intravenous Continuous Mihai Croitoru, MD 145.3 mL/hr at 04/22/15 1300 30 mcg/kg/min at 04/22/15 1300    Allergies:   Shellfish allergy; Septra; and Hydrocortisone   Past Medical History  Diagnosis Date  . Asthma   . Cushing syndrome ([place]) 2013   . Depression   . Anemia   . Osteopenia 11/19/2013    Dx with osteopenia in lumbar vertebra with partial compression of L1  .  Diabetes ([place]) 2013   . Diabetes mellitus with neuropathy ([place])   . HTN (hypertension) 2005   . COPD (chronic obstructive pulmonary disease) ([place]) 2013  . Compression fracture   . Bilateral hand numbness 06/25/2015  . Bone pain 02/27/2015    Diffuse bone pain in legs mostly but also in arms    . Breast pain, left 03/27/2015  . Callus of heel 12/16/2014  . Compression fracture of L1 lumbar vertebra (HCC) 08/26/2014  . Compression fracture of T12 vertebra ([place]) 08/28/2014  . Diabetes mellitus type 2, uncontrolled ([place]) 08/26/2014    Sees  Dr. Katy Fitch.  Last visit 11/27/14.  No diabetic retinopathy    . Diastolic dysfunction without heart failure 10/14/2014    Grade 2 diastolic dysfunction 2/2 pulm HTN   . Generalized anxiety disorder 03/27/2015  . Hx of cataract surgery 11/03/2014  . LUQ abdominal pain 07/17/2015  . Medication management 05/12/2015  . Morbid obesity ([place]) 09/08/2014  . 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 Non-English speaking patient 09/08/2014  . OSA (obstructive sleep apnea) 05/20/2015  . Pain due to onychomycosis of toenail 03/27/2015  . Secondary Cushing's syndrome/ iatrogenic   . Skin rash 07/17/2015  . Tachycardia 01/13/2015  . Upper airway cough syndrome 09/05/2014    Followed in Pulmonary clinic/ [facility]/ [place]  - Trial off advair      09/05/2014 >>>  - trial off theoph 09/17/2014 and added flutter  - rec reduce dulera to 100 2 bid 07/13/2015     . Vision changes 06/25/2015  . Vitamin D deficiency 08/28/2014    Past Surgical History  Procedure Laterality Date  . Cataract extraction Bilateral 2014  . Vaginal hysterectomy       Social History:  The patient  reports that she has never smoked. She has never used smokeless tobacco. She reports that she does not drink alcohol or use illicit drugs.   Family History:  The patient's family history includes Asthma in her father; Diabetes in her mother; Hypertension in her brother and sister; Stroke in her father.    ROS:   Please see the history of present illness. All other systems are reviewed and  Negative to the above problem except as noted.    PHYSICAL EXAM: VS:  BP 118/70 mmHg  Pulse 90  Ht 4' 10"  (1.473 m)  Wt 83.915 kg (185 lb)  BMI 38.68 kg/m2  SpO2 97%  GEN: Morbidly obese 44 yo in no acute distressWearing external brace HEENT: normal Neck: no JVD, carotid bruits, or masses Cardiac: RRR; no murmurs, rubs, or gallops,no edema  Respiratory:  Wheezes bilaterally  Mostly upper airway.  Decreased airflow   GI: soft, nontender, nondistended, + BS  No hepatomegaly  MS: no deformity Moving all extremities   Skin: warm and dry, no rash Neuro:  Strength and sensation are intact Psych: euthymic mood, full affect   EKG:  EKG is not ordered today.   Lipid Panel    Component Value Date/Time   CHOL 163 12/15/2014 1051   TRIG 192.0* 12/15/2014 1051   HDL 45.90 12/15/2014 1051   CHOLHDL 4 12/15/2014 1051   VLDL 38.4 12/15/2014 1051   LDLCALC 79 12/15/2014 1051      Wt Readings from Last 3 Encounters:  08/17/15 83.915 kg (185 lb)  07/23/15 83.604 kg (184 lb 5 oz)  07/17/15 83.008 kg (183 lb)      ASSESSMENT AND PLAN:  1.  CHest pressure Negattive dobutamine echo  Symptomas are most likely due to pulmonary problems   2.  Pulm  Continues to wheeze  Does not e some reflux symptoms even on 2 GI meds  F/U with pulmonary/GI   Signed, Dorris Carnes, MD  08/17/2015 2:07 PM    [address], [place], [place]  [address] Phone: [phone]; Fax: [phone]

## 2015-08-18 NOTE — Telephone Encounter (Signed)
Pt. Called requesting to speak to nurse regarding the medication of albuterol (PROVENTIL) (2.5 MG/3ML) 0.083% nebulizer solution, pt. Thinks that the medication is incorrect and would like to verify it with the nurse. Please f/u with pt.

## 2015-08-21 ENCOUNTER — Other Ambulatory Visit: Payer: Self-pay | Admitting: *Deleted

## 2015-08-21 DIAGNOSIS — J45998 Other asthma: Secondary | ICD-10-CM

## 2015-08-21 MED ORDER — ALBUTEROL SULFATE (2.5 MG/3ML) 0.083% IN NEBU: 2.5000 mg | INHALATION_SOLUTION | RESPIRATORY_TRACT | Status: DC | PRN

## 2015-08-21 NOTE — Telephone Encounter (Signed)
Rx refill send to [facility]

## 2015-08-24 ENCOUNTER — Telehealth: Payer: Self-pay | Admitting: Family Medicine

## 2015-08-24 MED ORDER — LOSARTAN POTASSIUM 100 MG PO TABS: 100.0000 mg | ORAL_TABLET | ORAL | Status: DC

## 2015-08-24 NOTE — Telephone Encounter (Signed)
Patient called to speak with RMA in regards to two medications she has a question about, please f/u with pt.

## 2015-08-24 NOTE — Telephone Encounter (Signed)
Pt requesting Refill Losartan  Refills  send to [facility]

## 2015-08-24 NOTE — Telephone Encounter (Signed)
I spoke to patient about her pain referral with [facility] re[facility]  Pt is going to give them a call .

## 2015-08-25 ENCOUNTER — Encounter: Payer: Self-pay | Admitting: Family Medicine

## 2015-08-25 DIAGNOSIS — E249 Cushing's syndrome, unspecified: Secondary | ICD-10-CM

## 2015-08-25 NOTE — Assessment & Plan Note (Signed)
Prednisone increase from 3 mg to 4 mg daily oat 12/7/016 OV

## 2015-08-26 NOTE — Telephone Encounter (Signed)
Patient called to speak with you in regards to her referral. Please f/u

## 2015-08-31 ENCOUNTER — Other Ambulatory Visit: Payer: Self-pay | Admitting: Family Medicine

## 2015-09-01 ENCOUNTER — Other Ambulatory Visit: Payer: Self-pay | Admitting: Family Medicine

## 2015-09-08 ENCOUNTER — Other Ambulatory Visit: Payer: Self-pay | Admitting: Family Medicine

## 2015-09-08 ENCOUNTER — Other Ambulatory Visit: Payer: Self-pay | Admitting: Pharmacist

## 2015-09-09 ENCOUNTER — Encounter (HOSPITAL_COMMUNITY): Payer: Self-pay | Admitting: Emergency Medicine

## 2015-09-09 ENCOUNTER — Telehealth: Payer: Self-pay | Admitting: Family Medicine

## 2015-09-09 ENCOUNTER — Emergency Department (HOSPITAL_COMMUNITY): Admission: EM | Admit: 2015-09-09 | Discharge: 2015-09-09 | Discharge status: Absent without leave | Payer: Self-pay

## 2015-09-09 ENCOUNTER — Emergency Department (INDEPENDENT_AMBULATORY_CARE_PROVIDER_SITE_OTHER)
Admission: EM | Admit: 2015-09-09 | Discharge: 2015-09-09 | Disposition: A | Discharge status: Home / Self Care | Payer: Medicaid Other | Source: Home / Self Care

## 2015-09-09 DIAGNOSIS — J029 Acute pharyngitis, unspecified: Secondary | ICD-10-CM | POA: Diagnosis not present

## 2015-09-09 LAB — POCT RAPID STREP A: Streptococcus, Group A Screen (Direct): NEGATIVE

## 2015-09-09 MED ORDER — AMOXICILLIN 250 MG PO CAPS
250.0000 mg | ORAL_CAPSULE | Freq: Three times a day (TID) | ORAL | Status: DC
Start: 2015-09-09 — End: 2015-09-25

## 2015-09-09 NOTE — ED Notes (Signed)
The patient presented to the [facility] with a complaint of a sore throat and left ear pain that started yesterday.

## 2015-09-09 NOTE — Telephone Encounter (Signed)
Patient called back to speak with CMA in regards to her issue, patient was offered an appointment tomorrow (09/10/2015) with a different provider but patient refused stating that she would rather go somewhere else if she is not able to see her PCP.   Please f/u with pt.

## 2015-09-09 NOTE — Discharge Instructions (Signed)
Faringitis (Pharyngitis) La faringitis es el dolor de garganta (faringe). La garganta presenta enrojecimiento, hinchazn y dolor. CUIDADOS EN EL HOGAR   Beba suficiente lquido para mantener la orina clara o de color amarillo plido.  Solo tome los medicamentos que le haya indicado su mdico.  Si no toma los medicamentos segn las indicaciones podra volver a enfermarse. Finalice la prescripcin completa, aunque comience a sentirse mejor.  No tome aspirina.  Reposo.  Enjuguese la boca Immunologist) con agua y sal (cucharadita de sal por litro de agua) cada 1 o 2horas. Esto ayudar a Best boy.  Si no corre riesgo de ahogarse, puede chupar un caramelo duro o pastillas para la garganta. SOLICITE AYUDA SI:  Tiene bultos grandes y dolorosos al tacto en el cuello.  Tiene una erupcin cutnea.  Cuando tose elimina una expectoracin verde, amarillo amarronado o con [place]. SOLICITE AYUDA DE INMEDIATO SI:   Presenta rigidez en el cuello.  Babea o no puede tragar lquidos.  Vomita o no puede retener los [employer] lquidos.  Siente un dolor intenso que no se alivia con medicamentos.  Tiene problemas para Ambulance person (y no debido a la nariz tapada). ASEGRESE DE QUE:   Comprende estas instrucciones.  Controlar su afeccin.  Recibir ayuda de inmediato si no mejora o si empeora.   Esta informacin no tiene Marine scientist el consejo del mdico. Asegrese de hacerle al mdico cualquier pregunta que tenga.   Document Released: 11/25/2008 Document Revised: 06/19/2013 Elsevier Interactive Patient Education [employer].

## 2015-09-09 NOTE — Telephone Encounter (Signed)
Patient called stating that she is unable to swallow anything, patient is having pain and would like some advice on what to do next. Please f/u

## 2015-09-10 NOTE — ED Provider Notes (Signed)
CSN: [id number]     Arrival date & time 09/09/15  1754 History   None    Chief Complaint  Patient presents with  . Sore Throat  . Otalgia   (Consider location/radiation/quality/duration/timing/severity/associated sxs/prior Treatment) HPI History obtained from patient:   LOCATION:throat and ear SEVERITY:6 DURATION:several days CONTEXT:sudden ons QUALITY: MODIFYING FACTORS:OTC meds without relief ASSOCIATED SYMPTOMS:ear ache,hurts to swallow TIMING:constant OCCUPATION:  Past Medical History  Diagnosis Date  . Asthma   . Cushing syndrome ([place]) 2013   . Depression   . Anemia   . Osteopenia 11/19/2013    Dx with osteopenia in lumbar vertebra with partial compression of L1  . Diabetes ([place]) 2013   . Diabetes mellitus with neuropathy ([place])   . HTN (hypertension) 2005   . COPD (chronic obstructive pulmonary disease) ([place]) 2013  . Compression fracture   . Bilateral hand numbness 06/25/2015  . Bone pain 02/27/2015    Diffuse bone pain in legs mostly but also in arms    . Breast pain, left 03/27/2015  . Callus of heel 12/16/2014  . Compression fracture of L1 lumbar vertebra (HCC) 08/26/2014  . Compression fracture of T12 vertebra ([place]) 08/28/2014  . Diabetes mellitus type 2, uncontrolled ([place]) 08/26/2014    Sees Dr. Katy Fitch.  Last visit 11/27/14.  No diabetic retinopathy    . Diastolic dysfunction without heart failure 10/14/2014    Grade 2 diastolic dysfunction 2/2 pulm HTN   . Generalized anxiety disorder 03/27/2015  . Hx of cataract surgery 11/03/2014  . LUQ abdominal pain 07/17/2015  . Medication management 05/12/2015  . Morbid obesity ([place]) 09/08/2014  . 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 Non-English speaking patient 09/08/2014  . OSA (obstructive sleep apnea) 05/20/2015  . Pain due to onychomycosis of toenail 03/27/2015  . Secondary Cushing's syndrome/ iatrogenic   . Skin rash 07/17/2015  . Tachycardia 01/13/2015  . Upper airway cough syndrome 09/05/2014    Followed in Pulmonary clinic/  [facility]/ [place]  - Trial off advair      09/05/2014 >>>  - trial off theoph 09/17/2014 and added flutter  - rec reduce dulera to 100 2 bid 07/13/2015     . Vision changes 06/25/2015  . Vitamin D deficiency 08/28/2014   Past Surgical History  Procedure Laterality Date  . Cataract extraction Bilateral 2014  . Vaginal hysterectomy     Family History  Problem Relation Age of Onset  . Diabetes Mother   . Stroke Father   . Asthma Father   . Hypertension Sister   . Hypertension Brother    Social History  Substance Use Topics  . Smoking status: Never Smoker   . Smokeless tobacco: Never Used  . Alcohol Use: No   OB History    No data available     Review of Systems ROS +'veear ache, sore throat  Denies: HEADACHE, NAUSEA, ABDOMINAL PAIN, CHEST PAIN, CONGESTION, DYSURIA, SHORTNESS OF BREATH  Allergies  Shellfish allergy; Septra; and Hydrocortisone  Home Medications   Prior to Admission medications   Medication Sig Start Date End Date Taking? Authorizing Provider  acetaminophen-codeine (TYLENOL #3) 300-30 MG tablet Take 1-2 tablets by mouth at bedtime as needed for moderate pain (or cough).    Historical Provider, MD  albuterol (PROVENTIL HFA;VENTOLIN HFA) 108 (90 BASE) MCG/ACT inhaler Inhale 2 puffs into the lungs every 4 (four) hours as needed for wheezing or shortness of breath.    Historical Provider, MD  albuterol (PROVENTIL) (2.5 MG/3ML) 0.083% nebulizer solution Take 3 mLs (2.5  mg total) by nebulization every 4 (four) hours as needed for wheezing or shortness of breath (plan C). 08/21/15   Josalyn Funches, MD  amoxicillin (AMOXIL) 250 MG capsule Take 1 capsule (250 mg total) by mouth 3 (three) times daily. 09/09/15   Konrad Felix, PA  aspirin 325 MG tablet Take 325 mg by mouth every morning.    Historical Provider, MD  atorvastatin (LIPITOR) 20 MG tablet Take 20 mg by mouth daily at 6 PM.    Historical Provider, MD  Blood Glucose Monitoring Suppl (ACCU-CHEK AVIVA  PLUS) W/DEVICE KIT Used as directed 11/05/14   Boykin Nearing, MD  cholecalciferol (VITAMIN D) 1000 UNITS tablet Take 1,000 Units by mouth every morning.    Historical Provider, MD  clonazePAM (KLONOPIN) 0.5 MG tablet Take 1 tablet (0.5 mg total) by mouth 2 (two) times daily as needed (trouble sleeping and anxiety). 07/23/15   Josalyn Funches, MD  cloNIDine (CATAPRES) 0.2 MG tablet Take 1 tablet (0.2 mg total) by mouth 3 (three) times daily. 12/15/14   Fay Records, MD  dextromethorphan-guaiFENesin [facility] DM) 30-600 MG per 12 hr tablet Take 1 tablet by mouth 2 (two) times daily as needed (with flutter valve for cough, congestion.).     Historical Provider, MD  ferrous sulfate 325 (65 FE) MG tablet Take 1 tablet (325 mg total) by mouth daily with breakfast. 12/31/14   Boykin Nearing, MD  FOLIC ACID PO Take 1 tablet by mouth every morning.    Historical Provider, MD  furosemide (LASIX) 40 MG tablet Take 1 tablet (40 mg total) by mouth every morning. 03/27/15   Josalyn Funches, MD  gabapentin (NEURONTIN) 400 MG capsule TAKE 2 CAPSULES BY MOUTH 2 TIMES DAILY. 09/09/15   Josalyn Funches, MD  glucose blood (RELION GLUCOSE TEST STRIPS) test strip 1 each by Other route 4 (four) times daily. ICD code E11.65 07/09/15   Boykin Nearing, MD  insulin aspart (NOVOLOG) 100 UNIT/ML injection Inject 80 Units into the skin 3 (three) times daily before meals. 07/08/15   Josalyn Funches, MD  Insulin Glargine (LANTUS SOLOSTAR) 100 UNIT/ML Solostar Pen Inject 80 Units into the skin at bedtime. 06/24/15   Josalyn Funches, MD  Insulin Pen Needle (B-D ULTRAFINE III SHORT PEN) 31G X 8 MM MISC 1 application by Does not apply route daily. 03/27/15   Josalyn Funches, MD  Insulin Syringe-Needle U-100 (BD INSULIN SYRINGE ULTRAFINE) 31G X 15/64" 1 ML MISC 1 each by Does not apply route 3 (three) times daily. 03/27/15   Boykin Nearing, MD  Lancet Devices [facility]) lancets Use as instructed 04/03/15   Boykin Nearing, MD   losartan (COZAAR) 100 MG tablet Take 1 tablet (100 mg total) by mouth every morning. 08/24/15   Boykin Nearing, MD  metFORMIN (GLUCOPHAGE) 500 MG tablet Take 1 tablet (500 mg total) by mouth 2 (two) times daily with a meal. 03/27/15   Josalyn Funches, MD  methocarbamol (ROBAXIN) 750 MG tablet Take 1 tablet (750 mg total) by mouth every 8 (eight) hours as needed for muscle spasms. 03/30/15   Josalyn Funches, MD  mometasone-formoterol (DULERA) 100-5 MCG/ACT AERO Take 2 puffs first thing in am and then another 2 puffs about 12 hours later. 07/13/15   Tanda Rockers, MD  montelukast (SINGULAIR) 10 MG tablet Take 1 tablet (10 mg total) by mouth at bedtime. 03/27/15   Boykin Nearing, MD  omeprazole (PRILOSEC) 20 MG capsule Take 1 capsule (20 mg total) by mouth daily before breakfast. 07/23/15  Boykin Nearing, MD  [place] Calcium 500 MG TABS Take 0.4 tablets (500 mg total) by mouth 2 (two) times daily after a meal. 03/27/15   Josalyn Funches, MD  PARoxetine (PAXIL) 20 MG tablet Take 1 tablet (20 mg total) by mouth daily. Please schedule appt with Dr. Adrian Blackwater for further refills 09/08/15   Boykin Nearing, MD  predniSONE (DELTASONE) 1 MG tablet Take 4 mg by mouth daily with breakfast.     Historical Provider, MD  ranitidine (ZANTAC) 150 MG tablet Take 2 tablets (300 mg total) by mouth at bedtime. 04/06/15   Boykin Nearing, MD  ReliOn Ultra Thin Lancets MISC 1 each by Does not apply route 4 (four) times daily. ICD code E11.65 03/27/15   Boykin Nearing, MD  Respiratory Therapy Supplies (FLUTTER) DEVI Use as directed 09/16/14   Tanda Rockers, MD  triamcinolone ointment (KENALOG) 0.5 % Apply 1 application topically 2 (two) times daily. 07/17/15   Josalyn Funches, MD  ULTICARE MICRO PEN NEEDLES 32G X 4 MM MISC USE AS DIRECTED 09/08/15   Josalyn Funches, MD  verapamil (VERELAN PM) 360 MG 24 hr capsule Take 1 capsule (360 mg total) by mouth every morning. 03/27/15   Boykin Nearing, MD   Meds Ordered and  Administered this Visit  Medications - No data to display  BP 129/70 mmHg  Pulse 89  Temp(Src) 98.1 F (36.7 C) (Oral)  Resp 16  SpO2 99% No data found.   Physical Exam  Constitutional: She is oriented to person, place, and time. She appears well-developed and well-nourished.  HENT:  Head: Normocephalic and atraumatic.  Right Ear: External ear normal.  Left Ear: External ear normal.  Mouth/Throat: Oropharynx is clear and moist.  Eyes: Conjunctivae are normal.  Pulmonary/Chest: Effort normal and breath sounds normal.  Neurological: She is alert and oriented to person, place, and time.  Skin: Skin is warm and dry.  Psychiatric: She has a normal mood and affect. Her behavior is normal. Judgment and thought content normal.    ED Course  Procedures (including critical care time)  Labs Review Labs Reviewed  CULTURE, GROUP A STREP  POCT RAPID STREP A    Imaging Review No results found.   Visual Acuity Review  Right Eye Distance:   Left Eye Distance:   Bilateral Distance:    Right Eye Near:   Left Eye Near:    Bilateral Near:         MDM   1. Pharyngitis    Strep test is negative Treatment as prescribed    Konrad Felix, PA 09/10/15 1026

## 2015-09-12 LAB — CULTURE, GROUP A STREP: Strep A Culture: POSITIVE — AB

## 2015-09-13 ENCOUNTER — Observation Stay (HOSPITAL_COMMUNITY)
Admission: EM | Admit: 2015-09-13 | Discharge: 2015-09-15 | Disposition: A | Discharge status: Home / Self Care | Payer: Medicaid Other | Attending: Student in an Organized Health Care Education/Training Program | Admitting: Student in an Organized Health Care Education/Training Program

## 2015-09-13 ENCOUNTER — Observation Stay (HOSPITAL_COMMUNITY): Payer: Medicaid Other

## 2015-09-13 ENCOUNTER — Emergency Department (HOSPITAL_COMMUNITY): Payer: Medicaid Other

## 2015-09-13 ENCOUNTER — Encounter (HOSPITAL_COMMUNITY): Payer: Self-pay

## 2015-09-13 DIAGNOSIS — I10 Essential (primary) hypertension: Secondary | ICD-10-CM | POA: Diagnosis not present

## 2015-09-13 DIAGNOSIS — F411 Generalized anxiety disorder: Secondary | ICD-10-CM | POA: Diagnosis not present

## 2015-09-13 DIAGNOSIS — Z7982 Long term (current) use of aspirin: Secondary | ICD-10-CM | POA: Insufficient documentation

## 2015-09-13 DIAGNOSIS — K760 Fatty (change of) liver, not elsewhere classified: Secondary | ICD-10-CM | POA: Diagnosis not present

## 2015-09-13 DIAGNOSIS — E11649 Type 2 diabetes mellitus with hypoglycemia without coma: Secondary | ICD-10-CM | POA: Diagnosis not present

## 2015-09-13 DIAGNOSIS — R109 Unspecified abdominal pain: Secondary | ICD-10-CM | POA: Diagnosis present

## 2015-09-13 DIAGNOSIS — K838 Other specified diseases of biliary tract: Secondary | ICD-10-CM | POA: Diagnosis not present

## 2015-09-13 DIAGNOSIS — E559 Vitamin D deficiency, unspecified: Secondary | ICD-10-CM | POA: Diagnosis not present

## 2015-09-13 DIAGNOSIS — R1110 Vomiting, unspecified: Secondary | ICD-10-CM | POA: Diagnosis not present

## 2015-09-13 DIAGNOSIS — Z7952 Long term (current) use of systemic steroids: Secondary | ICD-10-CM | POA: Insufficient documentation

## 2015-09-13 DIAGNOSIS — J020 Streptococcal pharyngitis: Secondary | ICD-10-CM | POA: Insufficient documentation

## 2015-09-13 DIAGNOSIS — Z6837 Body mass index (BMI) 37.0-37.9, adult: Secondary | ICD-10-CM | POA: Insufficient documentation

## 2015-09-13 DIAGNOSIS — J45909 Unspecified asthma, uncomplicated: Secondary | ICD-10-CM | POA: Diagnosis not present

## 2015-09-13 DIAGNOSIS — G4733 Obstructive sleep apnea (adult) (pediatric): Secondary | ICD-10-CM | POA: Diagnosis not present

## 2015-09-13 DIAGNOSIS — T380X5A Adverse effect of glucocorticoids and synthetic analogues, initial encounter: Secondary | ICD-10-CM | POA: Insufficient documentation

## 2015-09-13 DIAGNOSIS — E1140 Type 2 diabetes mellitus with diabetic neuropathy, unspecified: Secondary | ICD-10-CM | POA: Diagnosis not present

## 2015-09-13 DIAGNOSIS — Z7951 Long term (current) use of inhaled steroids: Secondary | ICD-10-CM | POA: Insufficient documentation

## 2015-09-13 DIAGNOSIS — F329 Major depressive disorder, single episode, unspecified: Secondary | ICD-10-CM | POA: Diagnosis not present

## 2015-09-13 DIAGNOSIS — E242 Drug-induced Cushing's syndrome: Secondary | ICD-10-CM | POA: Insufficient documentation

## 2015-09-13 DIAGNOSIS — Z794 Long term (current) use of insulin: Secondary | ICD-10-CM | POA: Diagnosis not present

## 2015-09-13 DIAGNOSIS — M818 Other osteoporosis without current pathological fracture: Secondary | ICD-10-CM | POA: Insufficient documentation

## 2015-09-13 DIAGNOSIS — J449 Chronic obstructive pulmonary disease, unspecified: Secondary | ICD-10-CM | POA: Diagnosis not present

## 2015-09-13 DIAGNOSIS — Z79899 Other long term (current) drug therapy: Secondary | ICD-10-CM | POA: Diagnosis not present

## 2015-09-13 DIAGNOSIS — R1011 Right upper quadrant pain: Secondary | ICD-10-CM | POA: Diagnosis not present

## 2015-09-13 LAB — GLUCOSE, CAPILLARY
Glucose-Capillary: 71 mg/dL (ref 65–99)
Glucose-Capillary: 114 mg/dL — ABNORMAL HIGH (ref 65–99)
Glucose-Capillary: 57 mg/dL — ABNORMAL LOW (ref 65–99)

## 2015-09-13 LAB — COMPREHENSIVE METABOLIC PANEL
ALT: 48 U/L (ref 14–54)
AST: 41 U/L (ref 15–41)
Albumin: 3.5 g/dL (ref 3.5–5.0)
Alkaline Phosphatase: 127 U/L — ABNORMAL HIGH (ref 38–126)
Anion gap: 10 (ref 5–15)
BUN: 11 mg/dL (ref 6–20)
CO2: 27 mmol/L (ref 22–32)
Calcium: 9.1 mg/dL (ref 8.9–10.3)
Chloride: 102 mmol/L (ref 101–111)
Creatinine, Ser: 0.71 mg/dL (ref 0.44–1.00)
GFR calc Af Amer: >60 mL/min (ref >60)
GFR calc non Af Amer: >60 mL/min (ref >60)
Glucose, Bld: 138 mg/dL — ABNORMAL HIGH (ref 65–99)
Potassium: 4 mmol/L (ref 3.5–5.1)
Sodium: 139 mmol/L (ref 135–145)
Total Bilirubin: 0.4 mg/dL (ref 0.3–1.2)
Total Protein: 7.7 g/dL (ref 6.5–8.1)

## 2015-09-13 LAB — URINALYSIS, ROUTINE W REFLEX MICROSCOPIC
Bilirubin Urine: NEGATIVE
Glucose, UA: NEGATIVE mg/dL
Ketones, ur: NEGATIVE mg/dL
Nitrite: NEGATIVE
Protein, ur: 30 mg/dL — AB
Specific Gravity, Urine: 1.025 (ref 1.005–1.030)
pH: 5 (ref 5.0–8.0)

## 2015-09-13 LAB — GAMMA GT: GGT: 73 U/L — ABNORMAL HIGH (ref 7–50)

## 2015-09-13 LAB — CBC
HCT: 39.8 % (ref 36.0–46.0)
Hemoglobin: 13 g/dL (ref 12.0–15.0)
MCH: 26.8 pg (ref 26.0–34.0)
MCHC: 32.7 g/dL (ref 30.0–36.0)
MCV: 82.1 fL (ref 78.0–100.0)
Platelets: 453 10*3/uL — ABNORMAL HIGH (ref 150–400)
RBC: 4.85 MIL/uL (ref 3.87–5.11)
RDW: 14.9 % (ref 11.5–15.5)
WBC: 11.1 10*3/uL — ABNORMAL HIGH (ref 4.0–10.5)

## 2015-09-13 LAB — URINE MICROSCOPIC-ADD ON
Bacteria, UA: NONE SEEN
WBC, UA: NONE SEEN WBC/hpf (ref 0–5)

## 2015-09-13 LAB — LIPASE, BLOOD: Lipase: 33 U/L (ref 11–51)

## 2015-09-13 LAB — POC URINE PREG, ED: Preg Test, Ur: NEGATIVE

## 2015-09-13 MED ORDER — ALUM & MAG HYDROXIDE-SIMETH 200-200-20 MG/5ML PO SUSP: 30.0000 mL | Freq: Four times a day (QID) | ORAL | Status: DC | PRN

## 2015-09-13 MED ORDER — PREDNISONE 1 MG PO TABS
4.0000 mg | ORAL_TABLET | Freq: Every day | ORAL | Status: DC
  Administered 2015-09-14 – 2015-09-15 (×2): 4 mg via ORAL
  Filled 2015-09-13 (×3): qty 4

## 2015-09-13 MED ORDER — VERAPAMIL HCL ER 180 MG PO TBCR
360.0000 mg | EXTENDED_RELEASE_TABLET | Freq: Every day | ORAL | Status: DC
  Administered 2015-09-14 – 2015-09-15 (×2): 360 mg via ORAL
  Filled 2015-09-13 (×3): qty 2

## 2015-09-13 MED ORDER — HYDROMORPHONE HCL 1 MG/ML IJ SOLN
0.2500 mg | INTRAMUSCULAR | Status: DC | PRN
  Administered 2015-09-13: 0.25 mg via INTRAVENOUS
  Filled 2015-09-13: qty 1

## 2015-09-13 MED ORDER — ALBUTEROL SULFATE (2.5 MG/3ML) 0.083% IN NEBU: 2.5000 mg | INHALATION_SOLUTION | RESPIRATORY_TRACT | Status: DC | PRN

## 2015-09-13 MED ORDER — CLONIDINE HCL 0.2 MG PO TABS
0.2000 mg | ORAL_TABLET | Freq: Three times a day (TID) | ORAL | Status: DC
Start: 2015-09-13 — End: 2015-09-15
  Administered 2015-09-14 – 2015-09-15 (×5): 0.2 mg via ORAL
  Filled 2015-09-13 (×5): qty 1

## 2015-09-13 MED ORDER — AMOXICILLIN 500 MG PO CAPS
500.0000 mg | ORAL_CAPSULE | Freq: Two times a day (BID) | ORAL | Status: DC
  Administered 2015-09-14 – 2015-09-15 (×3): 500 mg via ORAL
  Filled 2015-09-13 (×5): qty 1

## 2015-09-13 MED ORDER — LOSARTAN POTASSIUM 50 MG PO TABS
100.0000 mg | ORAL_TABLET | Freq: Every day | ORAL | Status: DC
  Administered 2015-09-14 – 2015-09-15 (×2): 100 mg via ORAL
  Filled 2015-09-13 (×2): qty 2

## 2015-09-13 MED ORDER — ONDANSETRON HCL 4 MG PO TABS: 4.0000 mg | ORAL_TABLET | Freq: Four times a day (QID) | ORAL | Status: DC | PRN

## 2015-09-13 MED ORDER — ONDANSETRON 4 MG PO TBDP
4.0000 mg | ORAL_TABLET | Freq: Once | ORAL | Status: AC | PRN
  Administered 2015-09-13: 4 mg via ORAL

## 2015-09-13 MED ORDER — INSULIN GLARGINE 100 UNIT/ML ~~LOC~~ SOLN
60.0000 [IU] | Freq: Every day | SUBCUTANEOUS | Status: DC
  Filled 2015-09-13 (×2): qty 0.6

## 2015-09-13 MED ORDER — INSULIN ASPART 100 UNIT/ML ~~LOC~~ SOLN: 0.0000 [IU] | Freq: Every day | SUBCUTANEOUS | Status: DC

## 2015-09-13 MED ORDER — SODIUM CHLORIDE 0.9 % IV BOLUS (SEPSIS)
1000.0000 mL | Freq: Once | INTRAVENOUS | Status: AC
  Administered 2015-09-13: 1000 mL via INTRAVENOUS

## 2015-09-13 MED ORDER — GABAPENTIN 400 MG PO CAPS
800.0000 mg | ORAL_CAPSULE | Freq: Two times a day (BID) | ORAL | Status: DC
  Administered 2015-09-14 – 2015-09-15 (×3): 800 mg via ORAL
  Filled 2015-09-13 (×3): qty 2

## 2015-09-13 MED ORDER — PANTOPRAZOLE SODIUM 40 MG PO TBEC
40.0000 mg | DELAYED_RELEASE_TABLET | Freq: Every day | ORAL | Status: DC
  Administered 2015-09-14 – 2015-09-15 (×2): 40 mg via ORAL
  Filled 2015-09-13 (×2): qty 1

## 2015-09-13 MED ORDER — PROMETHAZINE HCL 25 MG/ML IJ SOLN
25.0000 mg | Freq: Once | INTRAMUSCULAR | Status: AC
  Administered 2015-09-13: 25 mg via INTRAVENOUS
  Filled 2015-09-13: qty 1

## 2015-09-13 MED ORDER — SODIUM CHLORIDE 0.45 % IV SOLN
INTRAVENOUS | Status: AC
  Administered 2015-09-13: 19:00:00 via INTRAVENOUS

## 2015-09-13 MED ORDER — ACETAMINOPHEN 650 MG RE SUPP: 650.0000 mg | Freq: Four times a day (QID) | RECTAL | Status: DC | PRN

## 2015-09-13 MED ORDER — FAMOTIDINE 20 MG PO TABS
40.0000 mg | ORAL_TABLET | Freq: Every day | ORAL | Status: DC
  Administered 2015-09-14: 40 mg via ORAL
  Filled 2015-09-13: qty 2

## 2015-09-13 MED ORDER — VITAMIN D 1000 UNITS PO TABS
1000.0000 [IU] | ORAL_TABLET | Freq: Every day | ORAL | Status: DC
  Administered 2015-09-14 – 2015-09-15 (×2): 1000 [IU] via ORAL
  Filled 2015-09-13 (×3): qty 1

## 2015-09-13 MED ORDER — ASPIRIN EC 325 MG PO TBEC
325.0000 mg | DELAYED_RELEASE_TABLET | Freq: Every day | ORAL | Status: DC
Start: 2015-09-13 — End: 2015-09-15
  Administered 2015-09-14 – 2015-09-15 (×2): 325 mg via ORAL
  Filled 2015-09-13 (×2): qty 1

## 2015-09-13 MED ORDER — MOMETASONE FURO-FORMOTEROL FUM 100-5 MCG/ACT IN AERO
2.0000 | INHALATION_SPRAY | Freq: Two times a day (BID) | RESPIRATORY_TRACT | Status: DC
  Administered 2015-09-14 – 2015-09-15 (×3): 2 via RESPIRATORY_TRACT
  Filled 2015-09-13: qty 8.8

## 2015-09-13 MED ORDER — METHOCARBAMOL 750 MG PO TABS: 750.0000 mg | ORAL_TABLET | Freq: Three times a day (TID) | ORAL | Status: DC | PRN

## 2015-09-13 MED ORDER — DEXTROSE 50 % IV SOLN
INTRAVENOUS | Status: AC
  Administered 2015-09-13: 50 mL
  Filled 2015-09-13: qty 50

## 2015-09-13 MED ORDER — PAROXETINE HCL 20 MG PO TABS
20.0000 mg | ORAL_TABLET | Freq: Every day | ORAL | Status: DC
  Administered 2015-09-14 – 2015-09-15 (×2): 20 mg via ORAL
  Filled 2015-09-13 (×3): qty 1

## 2015-09-13 MED ORDER — ONDANSETRON HCL 4 MG/2ML IJ SOLN
4.0000 mg | Freq: Four times a day (QID) | INTRAMUSCULAR | Status: DC | PRN
  Administered 2015-09-13: 4 mg via INTRAVENOUS
  Filled 2015-09-13: qty 2

## 2015-09-13 MED ORDER — ACETAMINOPHEN-CODEINE #3 300-30 MG PO TABS
1.0000 | ORAL_TABLET | Freq: Every evening | ORAL | Status: DC | PRN
  Administered 2015-09-14 (×2): 1 via ORAL
  Filled 2015-09-13: qty 1
  Filled 2015-09-13 (×2): qty 2

## 2015-09-13 MED ORDER — HYDROMORPHONE HCL 1 MG/ML IJ SOLN
1.0000 mg | Freq: Once | INTRAMUSCULAR | Status: AC
  Administered 2015-09-13: 1 mg via INTRAVENOUS
  Filled 2015-09-13: qty 1

## 2015-09-13 MED ORDER — SODIUM CHLORIDE 0.9 % IJ SOLN
3.0000 mL | INTRAMUSCULAR | Status: DC | PRN
Start: 2015-09-13 — End: 2015-09-15

## 2015-09-13 MED ORDER — INSULIN ASPART 100 UNIT/ML ~~LOC~~ SOLN: 0.0000 [IU] | Freq: Three times a day (TID) | SUBCUTANEOUS | Status: DC

## 2015-09-13 MED ORDER — FENTANYL CITRATE (PF) 100 MCG/2ML IJ SOLN
50.0000 ug | Freq: Once | INTRAMUSCULAR | Status: AC
  Administered 2015-09-13: 50 ug via INTRAVENOUS
  Filled 2015-09-13: qty 2

## 2015-09-13 MED ORDER — ONDANSETRON 4 MG PO TBDP
ORAL_TABLET | ORAL | Status: AC
Start: 2015-09-13 — End: 2015-09-13
  Filled 2015-09-13: qty 1

## 2015-09-13 MED ORDER — MONTELUKAST SODIUM 10 MG PO TABS
10.0000 mg | ORAL_TABLET | Freq: Every day | ORAL | Status: DC
  Administered 2015-09-14: 10 mg via ORAL
  Filled 2015-09-13: qty 1

## 2015-09-13 MED ORDER — FUROSEMIDE 40 MG PO TABS
40.0000 mg | ORAL_TABLET | Freq: Every morning | ORAL | Status: DC
  Administered 2015-09-14 – 2015-09-15 (×2): 40 mg via ORAL
  Filled 2015-09-13 (×2): qty 1

## 2015-09-13 MED ORDER — DEXTROSE 50 % IV SOLN
1.0000 | Freq: Once | INTRAVENOUS | Status: AC
  Administered 2015-09-14: 50 mL via INTRAVENOUS
  Filled 2015-09-13: qty 50

## 2015-09-13 MED ORDER — CLONAZEPAM 0.5 MG PO TABS
0.5000 mg | ORAL_TABLET | Freq: Two times a day (BID) | ORAL | Status: DC | PRN
  Administered 2015-09-14 (×2): 0.5 mg via ORAL
  Filled 2015-09-13 (×2): qty 1

## 2015-09-13 MED ORDER — LORAZEPAM 2 MG/ML IJ SOLN: 0.5000 mg | Freq: Once | INTRAMUSCULAR | Status: DC | PRN

## 2015-09-13 MED ORDER — ENOXAPARIN SODIUM 40 MG/0.4ML ~~LOC~~ SOLN
40.0000 mg | SUBCUTANEOUS | Status: DC
  Administered 2015-09-14: 40 mg via SUBCUTANEOUS
  Filled 2015-09-13: qty 0.4

## 2015-09-13 MED ORDER — ATORVASTATIN CALCIUM 20 MG PO TABS
20.0000 mg | ORAL_TABLET | Freq: Every day | ORAL | Status: DC
  Administered 2015-09-14 – 2015-09-15 (×2): 20 mg via ORAL
  Filled 2015-09-13 (×2): qty 1

## 2015-09-13 MED ORDER — ONDANSETRON HCL 4 MG/2ML IJ SOLN
4.0000 mg | Freq: Once | INTRAMUSCULAR | Status: AC
  Administered 2015-09-13: 4 mg via INTRAVENOUS
  Filled 2015-09-13: qty 2

## 2015-09-13 MED ORDER — SODIUM CHLORIDE 0.9 % IJ SOLN
3.0000 mL | Freq: Two times a day (BID) | INTRAMUSCULAR | Status: DC
  Administered 2015-09-13 – 2015-09-15 (×3): 3 mL via INTRAVENOUS

## 2015-09-13 MED ORDER — SODIUM CHLORIDE 0.9 % IV SOLN: 250.0000 mL | INTRAVENOUS | Status: DC | PRN

## 2015-09-13 MED ORDER — ACETAMINOPHEN 325 MG PO TABS: 650.0000 mg | ORAL_TABLET | Freq: Four times a day (QID) | ORAL | Status: DC | PRN

## 2015-09-13 NOTE — Progress Notes (Signed)
Pt CBG was 57 alert and oriented on call MD Dr. Merrilyn Puma informed and ordered to give [id number] as ordered and rechecked cbg 114 will continue to monitor.

## 2015-09-13 NOTE — ED Notes (Signed)
Pt given something to drink and encouraged to try and drink fluids.

## 2015-09-13 NOTE — ED Notes (Signed)
Patient returned from US.

## 2015-09-13 NOTE — ED Notes (Signed)
Pt being transported upstairs by [place], EMT

## 2015-09-13 NOTE — ED Notes (Signed)
Pt. Woke up at 0300 am with vomiting and abdominal cramping.

## 2015-09-13 NOTE — H&P (Signed)
Date: 09/13/2015               Patient Name:  Raven Turner MRN: [id number]  DOB: 1970/10/29 Age / Sex: 45 y.o., female   PCP: Boykin Nearing, MD         Medical Service: Internal Medicine Teaching Service         Attending Physician: Dr. Aldine Contes, MD    First Contact: Dr. Liberty Handy Pager: [phone]  Second Contact: Dr. Jacques Earthly Pager: [phone]       After Hours (After 5p/  First Contact Pager: [phone]  weekends / holidays): Second Contact Pager: [phone]   Chief Complaint: Abdominal Pain  History of Present Illness:   Ms Mirian Capuchin is a 45 year old woman with a PMH of obesity, iatrogenic Cushing syndrome, steroid-induced osteoporosis with compression fractures, asthma (PFTs show no airway obstruction 10/2014), dCHF (EF 50-55, Grade 2) who presents with abdominal pain. Patient's first language is Spanish, and history was obtained with the assistance of a phone interpretor. The pain started at 3AM last night after a meal of pork and rice, describing it as a sharp, constant pain in her right upper quadrant. She cannot identify any aggravating or alleviating factors, other than the opioids she received in the ED. She reports never having this pain before. She does endorse a longstanding history of pain after meals, but she attributes this to heartburn. Her pain was associated with decreased appetite and eleven episodes on non-bloody, non-bilious emesis. She denies any diarrhea, with her last bowel movement yesterday. Her family history is pertinent for her sister having a cholecystectomy and her uncle having hepatitis. She has longstanding pain the the "bones of [her] legs" from fractures she's had and some wheezing. She says her weight is "up and down." She denies any fever, chest pain, light-headedness, shortness of breath, dysuria, new one-sided weakness, blackouts, headaches, skin changes, or vision changes. She is a non-smoker, non-drinker, and does not use illicit  drugs. She moved here from [place] last year. She is currently being treated for strep A positive pharyngitis with amoxicillin.  In the ED, she was afebrile and vital signs were stable. CBC was remarkable for mild leukocytosis (11.1) and thrombocytosis (453). Alkaline phosphatase was elevated to 127, normal AST and ALT. RUQ ultrasound remarkable for mild CBD dilatation without identifiable obstruction, recommending ERCP. Lipase normal. UA showed trace leukocytes. CXR normal. [facility] was consulted.    Meds: Current Facility-Administered Medications  Medication Dose Route Frequency Provider Last Rate Last Dose  . 0.9 %  sodium chloride infusion  250 mL Intravenous PRN Milagros Loll, MD      . acetaminophen (TYLENOL) tablet 650 mg  650 mg Oral Q6H PRN Milagros Loll, MD       Or  . acetaminophen (TYLENOL) suppository 650 mg  650 mg Rectal Q6H PRN Milagros Loll, MD      . alum & mag hydroxide-simeth (MAALOX/MYLANTA) 200-200-20 MG/5ML suspension 30 mL  30 mL Oral Q6H PRN Milagros Loll, MD      . enoxaparin (LOVENOX) injection 40 mg  40 mg Subcutaneous Q24H Milagros Loll, MD      . HYDROmorphone (DILAUDID) injection 0.25 mg  0.25 mg Intravenous Q4H PRN Milagros Loll, MD      . LORazepam (ATIVAN) injection 0.5 mg  0.5 mg Intravenous Once PRN Milagros Loll, MD      . ondansetron [facility]) tablet 4 mg  4 mg Oral Q6H PRN Anderson Malta  Pixie Casino, MD       Or  . ondansetron [facility]) injection 4 mg  4 mg Intravenous Q6H PRN Milagros Loll, MD      . ondansetron (ZOFRAN-ODT) 4 MG disintegrating tablet           . sodium chloride 0.9 % injection 3 mL  3 mL Intravenous Q12H Milagros Loll, MD      . sodium chloride 0.9 % injection 3 mL  3 mL Intravenous PRN Milagros Loll, MD       Current Outpatient Prescriptions  Medication Sig Dispense Refill  . acetaminophen-codeine (TYLENOL #3) 300-30 MG tablet Take 1-2 tablets by mouth at bedtime as needed for moderate pain (or cough).    Marland Kitchen  albuterol (PROVENTIL HFA;VENTOLIN HFA) 108 (90 BASE) MCG/ACT inhaler Inhale 2 puffs into the lungs every 4 (four) hours as needed for wheezing or shortness of breath.    Marland Kitchen albuterol (PROVENTIL) (2.5 MG/3ML) 0.083% nebulizer solution Take 3 mLs (2.5 mg total) by nebulization every 4 (four) hours as needed for wheezing or shortness of breath (plan C). 75 mL 3  . amoxicillin (AMOXIL) 250 MG capsule Take 1 capsule (250 mg total) by mouth 3 (three) times daily. 28 capsule 0  . aspirin 325 MG tablet Take 325 mg by mouth every morning.    Marland Kitchen atorvastatin (LIPITOR) 20 MG tablet Take 20 mg by mouth daily at 6 PM.    . Blood Glucose Monitoring Suppl (ACCU-CHEK AVIVA PLUS) W/DEVICE KIT Used as directed 1 kit 0  . cholecalciferol (VITAMIN D) 1000 UNITS tablet Take 1,000 Units by mouth every morning.    . clonazePAM (KLONOPIN) 0.5 MG tablet Take 1 tablet (0.5 mg total) by mouth 2 (two) times daily as needed (trouble sleeping and anxiety). 30 tablet 2  . cloNIDine (CATAPRES) 0.2 MG tablet Take 1 tablet (0.2 mg total) by mouth 3 (three) times daily. 90 tablet 11  . ferrous sulfate 325 (65 FE) MG tablet Take 1 tablet (325 mg total) by mouth daily with breakfast. 30 tablet 3  . FOLIC ACID PO Take 1 tablet by mouth every morning.    . furosemide (LASIX) 40 MG tablet Take 1 tablet (40 mg total) by mouth every morning. 30 tablet 2  . gabapentin (NEURONTIN) 400 MG capsule TAKE 2 CAPSULES BY MOUTH 2 TIMES DAILY. 120 capsule 0  . glucose blood (RELION GLUCOSE TEST STRIPS) test strip 1 each by Other route 4 (four) times daily. ICD code E11.65 400 each 3  . insulin aspart (NOVOLOG) 100 UNIT/ML injection Inject 80 Units into the skin 3 (three) times daily before meals. 3 vial 3  . Insulin Glargine (LANTUS SOLOSTAR) 100 UNIT/ML Solostar Pen Inject 80 Units into the skin at bedtime. 5 pen 11  . Insulin Pen Needle (B-D ULTRAFINE III SHORT PEN) 31G X 8 MM MISC 1 application by Does not apply route daily. 100 each 3  . Insulin  Syringe-Needle U-100 (BD INSULIN SYRINGE ULTRAFINE) 31G X 15/64" 1 ML MISC 1 each by Does not apply route 3 (three) times daily. 300 each 3  . Lancet Devices (ACCU-CHEK SOFTCLIX) lancets Use as instructed 1 each 0  . losartan (COZAAR) 100 MG tablet Take 1 tablet (100 mg total) by mouth every morning. 30 tablet 3  . metFORMIN (GLUCOPHAGE) 500 MG tablet Take 1 tablet (500 mg total) by mouth 2 (two) times daily with a meal. 60 tablet 11  . methocarbamol (ROBAXIN) 750 MG tablet Take 1 tablet (750  mg total) by mouth every 8 (eight) hours as needed for muscle spasms. 90 tablet 2  . mometasone-formoterol (DULERA) 100-5 MCG/ACT AERO Take 2 puffs first thing in am and then another 2 puffs about 12 hours later. 1 Inhaler 11  . montelukast (SINGULAIR) 10 MG tablet Take 1 tablet (10 mg total) by mouth at bedtime. 30 tablet 11  . omeprazole (PRILOSEC) 20 MG capsule Take 1 capsule (20 mg total) by mouth daily before breakfast. 90 capsule 1  . Oyster Shell Calcium 500 MG TABS Take 0.4 tablets (500 mg total) by mouth 2 (two) times daily after a meal. 60 tablet 11  . PARoxetine (PAXIL) 20 MG tablet Take 1 tablet (20 mg total) by mouth daily. Please schedule appt with Dr. Adrian Blackwater for further refills 30 tablet 1  . predniSONE (DELTASONE) 1 MG tablet Take 4 mg by mouth daily with breakfast.     . ranitidine (ZANTAC) 150 MG tablet Take 2 tablets (300 mg total) by mouth at bedtime. 60 tablet 5  . ReliOn Ultra Thin Lancets MISC 1 each by Does not apply route 4 (four) times daily. ICD code E11.65 400 each 3  . Respiratory Therapy Supplies (FLUTTER) DEVI Use as directed 1 each 0  . ULTICARE MICRO PEN NEEDLES 32G X 4 MM MISC USE AS DIRECTED 100 each 2  . verapamil (VERELAN PM) 360 MG 24 hr capsule Take 1 capsule (360 mg total) by mouth every morning. 30 capsule 11   Facility-Administered Medications Ordered in Other Encounters  Medication Dose Route Frequency Provider Last Rate Last Dose  . DOBUTamine (DOBUTREX) 1,000  mcg/mL in dextrose 5% 250 mL infusion  30 mcg/kg/min Intravenous Continuous Mihai Croitoru, MD 145.3 mL/hr at 04/22/15 1300 30 mcg/kg/min at 04/22/15 1300    Allergies: Allergies as of 09/13/2015 - Review Complete 09/13/2015  Allergen Reaction Noted  . Shellfish allergy Anaphylaxis 09/07/2014  . Septra [sulfamethoxazole-trimethoprim] Rash and Other (See Comments) 01/13/2015  . Hydrocortisone Rash 02/02/2015   Past Medical History  Diagnosis Date  . Asthma   . Cushing syndrome ([place]) 2013   . Depression   . Anemia   . Osteopenia 11/19/2013    Dx with osteopenia in lumbar vertebra with partial compression of L1  . Diabetes ([place]) 2013   . Diabetes mellitus with neuropathy ([place])   . HTN (hypertension) 2005   . COPD (chronic obstructive pulmonary disease) ([place]) 2013  . Compression fracture   . Bilateral hand numbness 06/25/2015  . Bone pain 02/27/2015    Diffuse bone pain in legs mostly but also in arms    . Breast pain, left 03/27/2015  . Callus of heel 12/16/2014  . Compression fracture of L1 lumbar vertebra ([facility]) 08/26/2014  . Compression fracture of T12 vertebra ([place]) 08/28/2014  . Diabetes mellitus type 2, uncontrolled ([place]) 08/26/2014    Sees Dr. Katy Fitch.  Last visit 11/27/14.  No diabetic retinopathy    . Diastolic dysfunction without heart failure 10/14/2014    Grade 2 diastolic dysfunction 2/2 pulm HTN   . Generalized anxiety disorder 03/27/2015  . Hx of cataract surgery 11/03/2014  . LUQ abdominal pain 07/17/2015  . Medication management 05/12/2015  . Morbid obesity ([place]) 09/08/2014  . 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 Non-English speaking patient 09/08/2014  . OSA (obstructive sleep apnea) 05/20/2015  . Pain due to onychomycosis of toenail 03/27/2015  . Secondary Cushing's syndrome/ iatrogenic   . Skin rash 07/17/2015  . Tachycardia 01/13/2015  . Upper airway cough syndrome 09/05/2014  Followed in Pulmonary clinic/ [facility]/ [place]  - Trial off advair      09/05/2014 >>>  -  trial off theoph 09/17/2014 and added flutter  - rec reduce dulera to 100 2 bid 07/13/2015     . Vision changes 06/25/2015  . Vitamin D deficiency 08/28/2014   Past Surgical History  Procedure Laterality Date  . Cataract extraction Bilateral 2014  . Vaginal hysterectomy     Family History  Problem Relation Age of Onset  . Diabetes Mother   . Stroke Father   . Asthma Father   . Hypertension Sister   . Hypertension Brother    Social History   Social History  . Marital Status: Married    Spouse Name: N/A  . Number of Children: N/A  . Years of Education: N/A   Occupational History  . Not on file.   Social History Main Topics  . Smoking status: Never Smoker   . Smokeless tobacco: Never Used  . Alcohol Use: No  . Drug Use: No  . Sexual Activity: Yes    Birth Control/ Protection: Surgical   Other Topics Concern  . Not on file   Social History Narrative   Lives at home with husband.  No children   She does not work   Highest level of education:  12th grade    Review of Systems: Negative except per HPI  Physical Exam: Blood pressure 117/61, pulse 85, temperature 98.9 F (37.2 C), temperature source Oral, resp. rate 20, height '4\' 10"'$  (1.473 m), weight 181 lb 6 oz (82.271 kg), last menstrual period 09/06/2015, SpO2 98 %. General: Lying in bed, NAD HEENT: PERRL, EOMI. Sclerae anicteric. No tonsillar exudate or erythema. Moist mucous membranes. Enlarged neck circumference. Cardiovascular: RRR, no murmurs, rubs, or gallops Pulmonary: Scattered expiratory wheezes. No crackles or rhonchi. Unlabored breathing. Abdominal: Obese. Striae. Tenderness to palpation in RUQ. Negative Murphy's sign. No rebound. No masses. Extremities: Trace edema in lower extremities. No calf tenderness or swelling.  Skin: No rashes. No hirsutism.  MSK: Pain on palpation of her tibia. Psychiatric: Reports a good mood  Lab results: Basic Metabolic Panel:  Recent Labs  09/13/15 0945  NA 139  K  4.0  CL 102  CO2 27  GLUCOSE 138*  BUN 11  CREATININE 0.71  CALCIUM 9.1   Liver Function Tests:  Recent Labs  09/13/15 0945  AST 41  ALT 48  ALKPHOS 127*  BILITOT 0.4  PROT 7.7  ALBUMIN 3.5    Recent Labs  09/13/15 0945  LIPASE 33   CBC:  Recent Labs  09/13/15 0945  WBC 11.1*  HGB 13.0  HCT 39.8  MCV 82.1  PLT 453*   Urinalysis:  Recent Labs  09/13/15 1001  COLORURINE YELLOW  LABSPEC 1.025  PHURINE 5.0  GLUCOSEU NEGATIVE  HGBUR LARGE*  BILIRUBINUR NEGATIVE  KETONESUR NEGATIVE  PROTEINUR 30*  NITRITE NEGATIVE  LEUKOCYTESUR TRACE*    Imaging results:  US Abdomen Complete  09/13/2015  CLINICAL DATA:  45 year old female with acute abdominal pain for 1 day. EXAM: ABDOMEN ULTRASOUND COMPLETE COMPARISON:  None. FINDINGS: Gallbladder: The gallbladder is unremarkable. There is no evidence of cholelithiasis or acute cholecystitis. Common bile duct: Diameter: 8 mm. No abnormalities of the visualized CBD identified. Liver: Increased echogenicity noted without focal abnormality. IVC: No abnormality visualized. Pancreas: Visualized portion unremarkable. Spleen: Size and appearance within normal limits. Right Kidney: Length: 12.9 cm. Echogenicity within normal limits. No mass or hydronephrosis visualized. Left Kidney: Length: 12.5  cm. Echogenicity within normal limits. No mass or hydronephrosis visualized. Abdominal aorta: No aneurysm visualized. Other findings: None. IMPRESSION: Mild CBD dilatation without identifiable obstructing cause of the visualized CBD. Consider further evaluation with ERCP/ MRCP as indicated. Hepatic steatosis. No other significant abnormalities. Electronically Signed   By: Margarette Canada M.D.   On: 09/13/2015 13:21   Dg Abd Acute W/chest  09/13/2015  CLINICAL DATA:  Patient with right-sided abdominal pain and nausea. EXAM: DG ABDOMEN ACUTE W/ 1V CHEST COMPARISON:  Chest radiograph 09/16/2014. FINDINGS: Stable cardiac and mediastinal contours. No  consolidative pulmonary opacities. No pleural effusion or pneumothorax. Gas is demonstrated within nondilated loops of large and small bowel in a nonobstructed pattern. No free intraperitoneal air. Apparent chronic healing fracture of the left inferior pubic ramus. SI joints are unremarkable. Lumbar spine degenerative changes. IMPRESSION: Nonobstructed bowel gas pattern. No acute cardiopulmonary process. Electronically Signed   By: Lovey Newcomer M.D.   On: 09/13/2015 16:10    Assessment & Plan by Problem:  Right Upper Quadrant Abdominal Pain: Cholestatic etiology, perhaps choledocholithiasis appears to be the most likely culprit given CBD dilatation and elevate alkaline phophatase (although alk phos has been higher, 133 in 2015). Normal lipase and normal LFTs lower my suspicions for gallstone pancreatitis and an acute hepatitis, respectively. We will obtain an MRCP to further characterize any potential cholestatic causes. If unremarkable, a HIDA scan may be warranted. - MRCP tomorrow - GGT pending - Hydromorphone 0.25 mg q4h prn pain - Maalox prn, Zofran prn - D5 1/2 NS 100 cc/hr - NPO  History of Asthma: Scattered wheezing on exam. PFTs in 10/2014 show no obstructive airway disease, although this could may been confounded by her chronic prednisone therapy.  - Albuterol nebulizer prn - Dulera 2 puffs BID - Singulair 10 mg daily - Prednisone 4 mg daily, attempts have been made to wean in the past  Type 2 Diabetes with Neuropathy: Patient is on chronic steroid therapy, thus worsening her T2DM. She is on an aggressive insulin regimen at home Novolog 80U TID with meals and 80U Lantus at bedtime.  - 60U lantus at bedtime - Resistant SSI - Gabapentin 800 mg daily  HTN:  117/61 on admission. Continue home medications - Clonidine 0.2 mg TID, Losartan 100 mg daily, Verapamil 360 mg 24 hr capsule daily  Grade 2 Diastolic CHF: Trace edema on exam - Furosemide 40 mg daily.   Strep A Pharyngitis: No  tonsillar exudate or erythema appreciated on exam. - Amoxicillin 500 mg BID for total 10 day course (end 09/18/2015)  Depression/Anxiety:  - Clonazepam 0.5 mg BID   DVT Prophylaxis: Lovenox [place]  Dispo: Disposition is deferred at this time, awaiting improvement of current medical problems. Anticipated discharge in approximately 1-2 day(s).   The patient does have a current PCP (Boykin Nearing, MD) and does not need an [facility] hospital follow-up appointment after discharge.  The patient does not have transportation limitations that hinder transportation to clinic appointments.  Signed: Liberty Handy, MD 09/13/2015, 5:03 PM

## 2015-09-13 NOTE — ED Provider Notes (Signed)
CSN: [id number]     Arrival date & time 09/13/15  3976 History   First MD Initiated Contact with Patient 09/13/15 1138     Chief Complaint  Patient presents with  . Emesis     (Consider location/radiation/quality/duration/timing/severity/associated sxs/prior Treatment) HPI Patient presents to the emergency department with upper abdominal discomfort along with nausea and vomiting.  The patient states this started around 3 AM.  Patient did not take any medications prior to arrival.  She states nothing seems to make her condition better or worse other than palpation of the upper abdomen.  The patient states that she does not have any chest pain, shortness of breath, weakness, dizziness, back pain, headache, blurred vision, cough, fever, incontinence, dysuria, hematuria, bloody stool or syncope.   Past Medical History  Diagnosis Date  . Asthma   . Cushing syndrome ([place]) 2013   . Depression   . Anemia   . Osteopenia 11/19/2013    Dx with osteopenia in lumbar vertebra with partial compression of L1  . Diabetes ([place]) 2013   . Diabetes mellitus with neuropathy ([place])   . HTN (hypertension) 2005   . COPD (chronic obstructive pulmonary disease) ([place]) 2013  . Compression fracture   . Bilateral hand numbness 06/25/2015  . Bone pain 02/27/2015    Diffuse bone pain in legs mostly but also in arms    . Breast pain, left 03/27/2015  . Callus of heel 12/16/2014  . Compression fracture of L1 lumbar vertebra ([facility]) 08/26/2014  . Compression fracture of T12 vertebra ([place]) 08/28/2014  . Diabetes mellitus type 2, uncontrolled ([place]) 08/26/2014    Sees Dr. Katy Fitch.  Last visit 11/27/14.  No diabetic retinopathy    . Diastolic dysfunction without heart failure 10/14/2014    Grade 2 diastolic dysfunction 2/2 pulm HTN   . Generalized anxiety disorder 03/27/2015  . Hx of cataract surgery 11/03/2014  . LUQ abdominal pain 07/17/2015  . Medication management 05/12/2015  . Morbid obesity ([place]) 09/08/2014  . 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 Non-English speaking patient 09/08/2014  . OSA (obstructive sleep apnea) 05/20/2015  . Pain due to onychomycosis of toenail 03/27/2015  . Secondary Cushing's syndrome/ iatrogenic   . Skin rash 07/17/2015  . Tachycardia 01/13/2015  . Upper airway cough syndrome 09/05/2014    Followed in Pulmonary clinic/ [facility]/ [place]  - Trial off advair      09/05/2014 >>>  - trial off theoph 09/17/2014 and added flutter  - rec reduce dulera to 100 2 bid 07/13/2015     . Vision changes 06/25/2015  . Vitamin D deficiency 08/28/2014   Past Surgical History  Procedure Laterality Date  . Cataract extraction Bilateral 2014  . Vaginal hysterectomy     Family History  Problem Relation Age of Onset  . Diabetes Mother   . Stroke Father   . Asthma Father   . Hypertension Sister   . Hypertension Brother    Social History  Substance Use Topics  . Smoking status: Never Smoker   . Smokeless tobacco: Never Used  . Alcohol Use: No   OB History    No data available     Review of Systems  All other systems negative except as documented in the HPI. All pertinent positives and negatives as reviewed in the HPI.  Allergies  Shellfish allergy; Septra; and Hydrocortisone  Home Medications   Prior to Admission medications   Medication Sig Start Date End Date Taking? Authorizing Provider  acetaminophen-codeine (TYLENOL #3) 300-30 MG  tablet Take 1-2 tablets by mouth at bedtime as needed for moderate pain (or cough).   Yes Historical Provider, MD  albuterol (PROVENTIL HFA;VENTOLIN HFA) 108 (90 BASE) MCG/ACT inhaler Inhale 2 puffs into the lungs every 4 (four) hours as needed for wheezing or shortness of breath.   Yes Historical Provider, MD  albuterol (PROVENTIL) (2.5 MG/3ML) 0.083% nebulizer solution Take 3 mLs (2.5 mg total) by nebulization every 4 (four) hours as needed for wheezing or shortness of breath (plan C). 08/21/15  Yes Josalyn Funches, MD  amoxicillin (AMOXIL) 250 MG  capsule Take 1 capsule (250 mg total) by mouth 3 (three) times daily. 09/09/15  Yes Konrad Felix, PA  aspirin 325 MG tablet Take 325 mg by mouth every morning.   Yes Historical Provider, MD  atorvastatin (LIPITOR) 20 MG tablet Take 20 mg by mouth daily at 6 PM.   Yes Historical Provider, MD  Blood Glucose Monitoring Suppl (ACCU-CHEK AVIVA PLUS) W/DEVICE KIT Used as directed 11/05/14  Yes Josalyn Funches, MD  cholecalciferol (VITAMIN D) 1000 UNITS tablet Take 1,000 Units by mouth every morning.   Yes Historical Provider, MD  clonazePAM (KLONOPIN) 0.5 MG tablet Take 1 tablet (0.5 mg total) by mouth 2 (two) times daily as needed (trouble sleeping and anxiety). 07/23/15  Yes Josalyn Funches, MD  cloNIDine (CATAPRES) 0.2 MG tablet Take 1 tablet (0.2 mg total) by mouth 3 (three) times daily. 12/15/14  Yes Fay Records, MD  ferrous sulfate 325 (65 FE) MG tablet Take 1 tablet (325 mg total) by mouth daily with breakfast. 12/31/14  Yes Josalyn Funches, MD  FOLIC ACID PO Take 1 tablet by mouth every morning.   Yes Historical Provider, MD  furosemide (LASIX) 40 MG tablet Take 1 tablet (40 mg total) by mouth every morning. 03/27/15  Yes Josalyn Funches, MD  gabapentin (NEURONTIN) 400 MG capsule TAKE 2 CAPSULES BY MOUTH 2 TIMES DAILY. 09/09/15  Yes Josalyn Funches, MD  glucose blood (RELION GLUCOSE TEST STRIPS) test strip 1 each by Other route 4 (four) times daily. ICD code E11.65 07/09/15  Yes Josalyn Funches, MD  insulin aspart (NOVOLOG) 100 UNIT/ML injection Inject 80 Units into the skin 3 (three) times daily before meals. 07/08/15  Yes Josalyn Funches, MD  Insulin Glargine (LANTUS SOLOSTAR) 100 UNIT/ML Solostar Pen Inject 80 Units into the skin at bedtime. 06/24/15  Yes Josalyn Funches, MD  Insulin Pen Needle (B-D ULTRAFINE III SHORT PEN) 31G X 8 MM MISC 1 application by Does not apply route daily. 03/27/15  Yes Josalyn Funches, MD  Insulin Syringe-Needle U-100 (BD INSULIN SYRINGE ULTRAFINE) 31G X 15/64" 1 ML  MISC 1 each by Does not apply route 3 (three) times daily. 03/27/15  Yes Boykin Nearing, MD  Lancet Devices [facility]) lancets Use as instructed 04/03/15  Yes Boykin Nearing, MD  losartan (COZAAR) 100 MG tablet Take 1 tablet (100 mg total) by mouth every morning. 08/24/15  Yes Boykin Nearing, MD  metFORMIN (GLUCOPHAGE) 500 MG tablet Take 1 tablet (500 mg total) by mouth 2 (two) times daily with a meal. 03/27/15  Yes Josalyn Funches, MD  methocarbamol (ROBAXIN) 750 MG tablet Take 1 tablet (750 mg total) by mouth every 8 (eight) hours as needed for muscle spasms. 03/30/15  Yes Josalyn Funches, MD  mometasone-formoterol (DULERA) 100-5 MCG/ACT AERO Take 2 puffs first thing in am and then another 2 puffs about 12 hours later. 07/13/15  Yes Tanda Rockers, MD  montelukast (SINGULAIR) 10 MG tablet Take 1 tablet (  10 mg total) by mouth at bedtime. 03/27/15  Yes Boykin Nearing, MD  omeprazole (PRILOSEC) 20 MG capsule Take 1 capsule (20 mg total) by mouth daily before breakfast. 07/23/15  Yes Boykin Nearing, MD  [place] Calcium 500 MG TABS Take 0.4 tablets (500 mg total) by mouth 2 (two) times daily after a meal. 03/27/15  Yes Josalyn Funches, MD  PARoxetine (PAXIL) 20 MG tablet Take 1 tablet (20 mg total) by mouth daily. Please schedule appt with Dr. Adrian Blackwater for further refills 09/08/15  Yes Boykin Nearing, MD  predniSONE (DELTASONE) 1 MG tablet Take 4 mg by mouth daily with breakfast.    Yes Historical Provider, MD  ranitidine (ZANTAC) 150 MG tablet Take 2 tablets (300 mg total) by mouth at bedtime. 04/06/15  Yes Josalyn Funches, MD  ReliOn Ultra Thin Lancets MISC 1 each by Does not apply route 4 (four) times daily. ICD code E11.65 03/27/15  Yes Boykin Nearing, MD  Respiratory Therapy Supplies (FLUTTER) DEVI Use as directed 09/16/14  Yes Tanda Rockers, MD  ULTICARE MICRO PEN NEEDLES 32G X 4 MM MISC USE AS DIRECTED 09/08/15  Yes Josalyn Funches, MD  verapamil (VERELAN PM) 360 MG 24 hr capsule Take  1 capsule (360 mg total) by mouth every morning. 03/27/15  Yes Josalyn Funches, MD  triamcinolone ointment (KENALOG) 0.5 % Apply 1 application topically 2 (two) times daily. Patient not taking: Reported on 09/13/2015 07/17/15   Josalyn Funches, MD   BP 146/74 mmHg  Pulse 85  Temp(Src) 98.7 F (37.1 C) (Oral)  Resp 20  Ht _0  (1.473 m)  Wt 82.271 kg  BMI 37.92 kg/m2  SpO2 98%  LMP 09/06/2015 Physical Exam  Constitutional: She is oriented to person, place, and time. She appears well-developed and well-nourished. No distress.  HENT:  Head: Normocephalic and atraumatic.  Mouth/Throat: Oropharynx is clear and moist.  Eyes: Pupils are equal, round, and reactive to light.  Neck: Normal range of motion. Neck supple.  Cardiovascular: Normal rate, regular rhythm and normal heart sounds.  Exam reveals no gallop and no friction rub.   No murmur heard. Pulmonary/Chest: Effort normal and breath sounds normal. No respiratory distress. She has no wheezes.  Abdominal: Soft. Normal appearance and bowel sounds are normal. She exhibits no distension. There is tenderness.    Neurological: She is alert and oriented to person, place, and time. She exhibits normal muscle tone. Coordination normal.  Skin: Skin is warm and dry. No rash noted. No erythema.  Psychiatric: She has a normal mood and affect. Her behavior is normal.  Nursing note and vitals reviewed.   ED Course  Procedures (including critical care time) Labs Review Labs Reviewed  COMPREHENSIVE METABOLIC PANEL - Abnormal; Notable for the following:    Glucose, Bld 138 (*)    Alkaline Phosphatase 127 (*)    All other components within normal limits  CBC - Abnormal; Notable for the following:    WBC 11.1 (*)    Platelets 453 (*)    All other components within normal limits  URINALYSIS, ROUTINE W REFLEX MICROSCOPIC (NOT AT [facility]) - Abnormal; Notable for the following:    Hgb urine dipstick LARGE (*)    Protein, ur 30 (*)    Leukocytes, UA  TRACE (*)    All other components within normal limits  URINE MICROSCOPIC-ADD ON - Abnormal; Notable for the following:    Squamous Epithelial / LPF 0-5 (*)    All other components within normal limits  LIPASE, BLOOD  POC URINE PREG, ED    Imaging Review US Abdomen Complete  09/13/2015  CLINICAL DATA:  45 year old female with acute abdominal pain for 1 day. EXAM: ABDOMEN ULTRASOUND COMPLETE COMPARISON:  None. FINDINGS: Gallbladder: The gallbladder is unremarkable. There is no evidence of cholelithiasis or acute cholecystitis. Common bile duct: Diameter: 8 mm. No abnormalities of the visualized CBD identified. Liver: Increased echogenicity noted without focal abnormality. IVC: No abnormality visualized. Pancreas: Visualized portion unremarkable. Spleen: Size and appearance within normal limits. Right Kidney: Length: 12.9 cm. Echogenicity within normal limits. No mass or hydronephrosis visualized. Left Kidney: Length: 12.5 cm. Echogenicity within normal limits. No mass or hydronephrosis visualized. Abdominal aorta: No aneurysm visualized. Other findings: None. IMPRESSION: Mild CBD dilatation without identifiable obstructing cause of the visualized CBD. Consider further evaluation with ERCP/ MRCP as indicated. Hepatic steatosis. No other significant abnormalities. Electronically Signed   By: Margarette Canada M.D.   On: 09/13/2015 13:21   I have personally reviewed and evaluated these images and lab results as part of my medical decision-making.   EKG Interpretation None      Patient be admitted to the hospital for further evaluation and care of her right upper quadrant pain along with possible common bile duct stent spoke with [facility] and the [facility].   Dalia Heading, PA-C 09/17/15 1456  Virgel Manifold, MD 09/18/15 1020

## 2015-09-13 NOTE — ED Notes (Signed)
Pt returned from X-ray.  

## 2015-09-14 DIAGNOSIS — R1110 Vomiting, unspecified: Secondary | ICD-10-CM | POA: Diagnosis not present

## 2015-09-14 DIAGNOSIS — K838 Other specified diseases of biliary tract: Secondary | ICD-10-CM

## 2015-09-14 DIAGNOSIS — R1011 Right upper quadrant pain: Secondary | ICD-10-CM | POA: Diagnosis not present

## 2015-09-14 LAB — COMPREHENSIVE METABOLIC PANEL
ALT: 44 U/L (ref 14–54)
AST: 46 U/L — ABNORMAL HIGH (ref 15–41)
Albumin: 3 g/dL — ABNORMAL LOW (ref 3.5–5.0)
Alkaline Phosphatase: 108 U/L (ref 38–126)
Anion gap: 11 (ref 5–15)
BUN: 7 mg/dL (ref 6–20)
CO2: 24 mmol/L (ref 22–32)
Calcium: 8.4 mg/dL — ABNORMAL LOW (ref 8.9–10.3)
Chloride: 106 mmol/L (ref 101–111)
Creatinine, Ser: 0.64 mg/dL (ref 0.44–1.00)
GFR calc Af Amer: >60 mL/min (ref >60)
GFR calc non Af Amer: >60 mL/min (ref >60)
Glucose, Bld: 72 mg/dL (ref 65–99)
Potassium: 2.9 mmol/L — ABNORMAL LOW (ref 3.5–5.1)
Sodium: 141 mmol/L (ref 135–145)
Total Bilirubin: 0.6 mg/dL (ref 0.3–1.2)
Total Protein: 6.8 g/dL (ref 6.5–8.1)

## 2015-09-14 LAB — GLUCOSE, CAPILLARY
Glucose-Capillary: 100 mg/dL — ABNORMAL HIGH (ref 65–99)
Glucose-Capillary: 116 mg/dL — ABNORMAL HIGH (ref 65–99)
Glucose-Capillary: 156 mg/dL — ABNORMAL HIGH (ref 65–99)
Glucose-Capillary: 66 mg/dL (ref 65–99)
Glucose-Capillary: 68 mg/dL (ref 65–99)
Glucose-Capillary: 92 mg/dL (ref 65–99)
Glucose-Capillary: 93 mg/dL (ref 65–99)

## 2015-09-14 MED ORDER — INSULIN GLARGINE 100 UNIT/ML ~~LOC~~ SOLN
40.0000 [IU] | Freq: Every day | SUBCUTANEOUS | Status: DC
Start: 2015-09-14 — End: 2015-09-14

## 2015-09-14 MED ORDER — SODIUM CHLORIDE 0.45 % IV SOLN
INTRAVENOUS | Status: DC
  Administered 2015-09-14: 1 mL via INTRAVENOUS

## 2015-09-14 MED ORDER — DEXTROSE 50 % IV SOLN: 1.0000 | Freq: Four times a day (QID) | INTRAVENOUS | Status: DC | PRN

## 2015-09-14 MED ORDER — POTASSIUM CHLORIDE CRYS ER 20 MEQ PO TBCR
40.0000 meq | EXTENDED_RELEASE_TABLET | Freq: Once | ORAL | Status: AC
  Administered 2015-09-14: 40 meq via ORAL
  Filled 2015-09-14: qty 2

## 2015-09-14 MED ORDER — INSULIN ASPART 100 UNIT/ML ~~LOC~~ SOLN: 0.0000 [IU] | Freq: Three times a day (TID) | SUBCUTANEOUS | Status: DC

## 2015-09-14 NOTE — Consult Note (Signed)
[place]  Referring Provider: [facility] Primary Care Physician:  Minerva Ends, MD Primary Gastroenterologist:  Marlynn Perking Althia Forts  Reason for Consultation:   Abdominal pain /elevated ALK Phos  HPI: Raven Turner is a 45 y.o. female [place] who speaks very little English who was admitted through the emergency room last evening with acute onset of sharp right upper quadrant pain at 3 AM yesterday morning waking her from sleep. This was associated with multiple episodes of vomiting. No diarrhea, no fever or chills. This is the first episode of this type of pain. Patient has multiple other medical issues including adult-onset diabetes mellitus, sleep apnea, asthma, obesity, and congestive heart failure with EF of 50-55%. She is also had several compression fractures and has steroid-induced osteoporosis. Workup in the emergency room with upper abdominal ultrasound showed mild common bile duct dilation to 8 mm no gallstones noted , no gallbladder wall thickening.. MRI/MRCP was done this morning and report is pending. On admission LFTs were normal with the exception of an alkaline phosphatase at 127 and today LFTs are normal. Exline She has not had any further nausea or vomiting but continues to complain of right upper quadrant pain which is about the same.   Past Medical History  Diagnosis Date  . Asthma   . Cushing syndrome ([place]) 2013   . Depression   . Anemia   . Osteopenia 11/19/2013    Dx with osteopenia in lumbar vertebra with partial compression of L1  . Diabetes ([place]) 2013   . Diabetes mellitus with neuropathy ([place])   . HTN (hypertension) 2005   . COPD (chronic obstructive pulmonary disease) ([place]) 2013  . Compression fracture   . Bilateral hand numbness 06/25/2015  . Bone pain 02/27/2015    Diffuse bone pain in legs mostly but also in arms    . Breast pain, left 03/27/2015  . Callus of heel 12/16/2014  . Compression fracture of L1  lumbar vertebra ([facility]) 08/26/2014  . Compression fracture of T12 vertebra ([place]) 08/28/2014  . Diabetes mellitus type 2, uncontrolled ([place]) 08/26/2014    Sees Dr. Katy Fitch.  Last visit 11/27/14.  No diabetic retinopathy    . Diastolic dysfunction without heart failure 10/14/2014    Grade 2 diastolic dysfunction 2/2 pulm HTN   . Generalized anxiety disorder 03/27/2015  . Hx of cataract surgery 11/03/2014  . LUQ abdominal pain 07/17/2015  . Medication management 05/12/2015  . Morbid obesity ([place]) 09/08/2014  . 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 Non-English speaking patient 09/08/2014  . OSA (obstructive sleep apnea) 05/20/2015  . Pain due to onychomycosis of toenail 03/27/2015  . Secondary Cushing's syndrome/ iatrogenic   . Skin rash 07/17/2015  . Tachycardia 01/13/2015  . Upper airway cough syndrome 09/05/2014    Followed in Pulmonary clinic/ [facility]/ [place]  - Trial off advair      09/05/2014 >>>  - trial off theoph 09/17/2014 and added flutter  - rec reduce dulera to 100 2 bid 07/13/2015     . Vision changes 06/25/2015  . Vitamin D deficiency 08/28/2014    Past Surgical History  Procedure Laterality Date  . Cataract extraction Bilateral 2014  . Vaginal hysterectomy      Prior to Admission medications   Medication Sig Start Date End Date Taking? Authorizing Provider  acetaminophen-codeine (TYLENOL #3) 300-30 MG tablet Take 1-2 tablets by mouth at bedtime as needed for moderate pain (or cough).   Yes Historical Provider, MD  albuterol (PROVENTIL HFA;VENTOLIN HFA) 108 (  90 BASE) MCG/ACT inhaler Inhale 2 puffs into the lungs every 4 (four) hours as needed for wheezing or shortness of breath.   Yes Historical Provider, MD  albuterol (PROVENTIL) (2.5 MG/3ML) 0.083% nebulizer solution Take 3 mLs (2.5 mg total) by nebulization every 4 (four) hours as needed for wheezing or shortness of breath (plan C). 08/21/15  Yes Josalyn Funches, MD  amoxicillin (AMOXIL) 250 MG capsule Take 1 capsule (250 mg total) by  mouth 3 (three) times daily. 09/09/15  Yes Konrad Felix, PA  aspirin 325 MG tablet Take 325 mg by mouth every morning.   Yes Historical Provider, MD  atorvastatin (LIPITOR) 20 MG tablet Take 20 mg by mouth daily at 6 PM.   Yes Historical Provider, MD  Blood Glucose Monitoring Suppl (ACCU-CHEK AVIVA PLUS) W/DEVICE KIT Used as directed 11/05/14  Yes Josalyn Funches, MD  cholecalciferol (VITAMIN D) 1000 UNITS tablet Take 1,000 Units by mouth every morning.   Yes Historical Provider, MD  clonazePAM (KLONOPIN) 0.5 MG tablet Take 1 tablet (0.5 mg total) by mouth 2 (two) times daily as needed (trouble sleeping and anxiety). 07/23/15  Yes Josalyn Funches, MD  cloNIDine (CATAPRES) 0.2 MG tablet Take 1 tablet (0.2 mg total) by mouth 3 (three) times daily. 12/15/14  Yes Fay Records, MD  ferrous sulfate 325 (65 FE) MG tablet Take 1 tablet (325 mg total) by mouth daily with breakfast. 12/31/14  Yes Josalyn Funches, MD  FOLIC ACID PO Take 1 tablet by mouth every morning.   Yes Historical Provider, MD  furosemide (LASIX) 40 MG tablet Take 1 tablet (40 mg total) by mouth every morning. 03/27/15  Yes Josalyn Funches, MD  gabapentin (NEURONTIN) 400 MG capsule TAKE 2 CAPSULES BY MOUTH 2 TIMES DAILY. 09/09/15  Yes Josalyn Funches, MD  glucose blood (RELION GLUCOSE TEST STRIPS) test strip 1 each by Other route 4 (four) times daily. ICD code E11.65 07/09/15  Yes Josalyn Funches, MD  insulin aspart (NOVOLOG) 100 UNIT/ML injection Inject 80 Units into the skin 3 (three) times daily before meals. 07/08/15  Yes Josalyn Funches, MD  Insulin Glargine (LANTUS SOLOSTAR) 100 UNIT/ML Solostar Pen Inject 80 Units into the skin at bedtime. 06/24/15  Yes Josalyn Funches, MD  Insulin Pen Needle (B-D ULTRAFINE III SHORT PEN) 31G X 8 MM MISC 1 application by Does not apply route daily. 03/27/15  Yes Josalyn Funches, MD  Insulin Syringe-Needle U-100 (BD INSULIN SYRINGE ULTRAFINE) 31G X 15/64" 1 ML MISC 1 each by Does not apply route 3  (three) times daily. 03/27/15  Yes Boykin Nearing, MD  Lancet Devices [facility]) lancets Use as instructed 04/03/15  Yes Boykin Nearing, MD  losartan (COZAAR) 100 MG tablet Take 1 tablet (100 mg total) by mouth every morning. 08/24/15  Yes Boykin Nearing, MD  metFORMIN (GLUCOPHAGE) 500 MG tablet Take 1 tablet (500 mg total) by mouth 2 (two) times daily with a meal. 03/27/15  Yes Josalyn Funches, MD  methocarbamol (ROBAXIN) 750 MG tablet Take 1 tablet (750 mg total) by mouth every 8 (eight) hours as needed for muscle spasms. 03/30/15  Yes Josalyn Funches, MD  mometasone-formoterol (DULERA) 100-5 MCG/ACT AERO Take 2 puffs first thing in am and then another 2 puffs about 12 hours later. 07/13/15  Yes Tanda Rockers, MD  montelukast (SINGULAIR) 10 MG tablet Take 1 tablet (10 mg total) by mouth at bedtime. 03/27/15  Yes Josalyn Funches, MD  omeprazole (PRILOSEC) 20 MG capsule Take 1 capsule (20 mg total) by mouth  daily before breakfast. 07/23/15  Yes Boykin Nearing, MD  [place] Calcium 500 MG TABS Take 0.4 tablets (500 mg total) by mouth 2 (two) times daily after a meal. 03/27/15  Yes Josalyn Funches, MD  PARoxetine (PAXIL) 20 MG tablet Take 1 tablet (20 mg total) by mouth daily. Please schedule appt with Dr. Adrian Blackwater for further refills 09/08/15  Yes Boykin Nearing, MD  predniSONE (DELTASONE) 1 MG tablet Take 4 mg by mouth daily with breakfast.    Yes Historical Provider, MD  ranitidine (ZANTAC) 150 MG tablet Take 2 tablets (300 mg total) by mouth at bedtime. 04/06/15  Yes Josalyn Funches, MD  ReliOn Ultra Thin Lancets MISC 1 each by Does not apply route 4 (four) times daily. ICD code E11.65 03/27/15  Yes Boykin Nearing, MD  Respiratory Therapy Supplies (FLUTTER) DEVI Use as directed 09/16/14  Yes Tanda Rockers, MD  ULTICARE MICRO PEN NEEDLES 32G X 4 MM MISC USE AS DIRECTED 09/08/15  Yes Josalyn Funches, MD  verapamil (VERELAN PM) 360 MG 24 hr capsule Take 1 capsule (360 mg total) by mouth  every morning. 03/27/15  Yes Boykin Nearing, MD    Current Facility-Administered Medications  Medication Dose Route Frequency Provider Last Rate Last Dose  . 0.45 % sodium chloride infusion   Intravenous Continuous Norval Gable, MD 100 mL/hr at 09/14/15 0623 1 mL at 09/14/15 0623  . 0.9 %  sodium chloride infusion  250 mL Intravenous PRN Milagros Loll, MD      . acetaminophen (TYLENOL) tablet 650 mg  650 mg Oral Q6H PRN Milagros Loll, MD       Or  . acetaminophen (TYLENOL) suppository 650 mg  650 mg Rectal Q6H PRN Milagros Loll, MD      . acetaminophen-codeine (TYLENOL #3) 300-30 MG per tablet 1-2 tablet  1-2 tablet Oral QHS PRN Milagros Loll, MD   1 tablet at 09/14/15 0556  . albuterol (PROVENTIL) (2.5 MG/3ML) 0.083% nebulizer solution 2.5 mg  2.5 mg Nebulization Q4H PRN Milagros Loll, MD      . alum & mag hydroxide-simeth (MAALOX/MYLANTA) 200-200-20 MG/5ML suspension 30 mL  30 mL Oral Q6H PRN Milagros Loll, MD      . amoxicillin (AMOXIL) capsule 500 mg  500 mg Oral Q12H Milagros Loll, MD   500 mg at 09/13/15 2200  . aspirin EC tablet 325 mg  325 mg Oral Daily Milagros Loll, MD   325 mg at 09/14/15 0843  . atorvastatin (LIPITOR) tablet 20 mg  20 mg Oral q1800 Milagros Loll, MD   20 mg at 09/13/15 2000  . cholecalciferol (VITAMIN D) tablet 1,000 Units  1,000 Units Oral Daily Milagros Loll, MD   1,000 Units at 09/14/15 [phone]  . clonazePAM (KLONOPIN) tablet 0.5 mg  0.5 mg Oral BID PRN Milagros Loll, MD   0.5 mg at 09/14/15 0556  . cloNIDine (CATAPRES) tablet 0.2 mg  0.2 mg Oral TID Milagros Loll, MD   0.2 mg at 09/14/15 0843  . enoxaparin (LOVENOX) injection 40 mg  40 mg Subcutaneous Q24H Milagros Loll, MD   40 mg at 09/13/15 2000  . famotidine (PEPCID) tablet 40 mg  40 mg Oral QHS Milagros Loll, MD   40 mg at 09/13/15 2200  . furosemide (LASIX) tablet 40 mg  40 mg Oral q morning - 10a Milagros Loll, MD   40 mg at 09/14/15 0843  . gabapentin  (NEURONTIN)  capsule 800 mg  800 mg Oral BID Milagros Loll, MD   800 mg at 09/14/15 0843  . HYDROmorphone (DILAUDID) injection 0.25 mg  0.25 mg Intravenous Q4H PRN Milagros Loll, MD   0.25 mg at 09/13/15 2312  . insulin aspart (novoLOG) injection 0-15 Units  0-15 Units Subcutaneous TID WC Norman Herrlich, MD      . insulin glargine (LANTUS) injection 40 Units  40 Units Subcutaneous QHS Norman Herrlich, MD      . LORazepam (ATIVAN) injection 0.5 mg  0.5 mg Intravenous Once PRN Milagros Loll, MD      . losartan (COZAAR) tablet 100 mg  100 mg Oral Daily Milagros Loll, MD   100 mg at 09/14/15 0843  . methocarbamol (ROBAXIN) tablet 750 mg  750 mg Oral Q8H PRN Milagros Loll, MD      . mometasone-formoterol [facility]) 100-5 MCG/ACT inhaler 2 puff  2 puff Inhalation BID Milagros Loll, MD   2 puff at 09/14/15 0805  . montelukast (SINGULAIR) tablet 10 mg  10 mg Oral QHS Milagros Loll, MD   10 mg at 09/13/15 2200  . ondansetron (ZOFRAN) tablet 4 mg  4 mg Oral Q6H PRN Milagros Loll, MD       Or  . ondansetron [facility]) injection 4 mg  4 mg Intravenous Q6H PRN Milagros Loll, MD   4 mg at 09/13/15 2144  . pantoprazole (PROTONIX) EC tablet 40 mg  40 mg Oral Daily Milagros Loll, MD   40 mg at 09/14/15 0843  . PARoxetine (PAXIL) tablet 20 mg  20 mg Oral Daily Milagros Loll, MD   20 mg at 09/13/15 2000  . predniSONE (DELTASONE) tablet 4 mg  4 mg Oral Q breakfast Milagros Loll, MD      . sodium chloride 0.9 % injection 3 mL  3 mL Intravenous Q12H Milagros Loll, MD   3 mL at 09/14/15 0844  . sodium chloride 0.9 % injection 3 mL  3 mL Intravenous PRN Milagros Loll, MD      . verapamil (CALAN-SR) CR tablet 360 mg  360 mg Oral Daily Milagros Loll, MD   360 mg at 09/13/15 2000   Facility-Administered Medications Ordered in Other Encounters  Medication Dose Route Frequency Provider Last Rate Last Dose  . DOBUTamine (DOBUTREX) 1,000 mcg/mL in dextrose 5% 250 mL infusion  30  mcg/kg/min Intravenous Continuous Mihai Croitoru, MD 145.3 mL/hr at 04/22/15 1300 30 mcg/kg/min at 04/22/15 1300    Allergies as of 09/13/2015 - Review Complete 09/13/2015  Allergen Reaction Noted  . Shellfish allergy Anaphylaxis 09/07/2014  . Septra [sulfamethoxazole-trimethoprim] Rash and Other (See Comments) 01/13/2015  . Hydrocortisone Rash 02/02/2015    Family History  Problem Relation Age of Onset  . Diabetes Mother   . Stroke Father   . Asthma Father   . Hypertension Sister   . Hypertension Brother     Social History   Social History  . Marital Status: Married    Spouse Name: N/A  . Number of Children: N/A  . Years of Education: N/A   Occupational History  . Not on file.   Social History Main Topics  . Smoking status: Never Smoker   . Smokeless tobacco: Never Used  . Alcohol Use: No  . Drug Use: No  . Sexual Activity: Yes    Birth Control/ Protection: Surgical   Other Topics Concern  . Not on file  Social History Narrative   Lives at home with husband.  No children   She does not work   Highest level of education:  12th grade    Review of Systems: Pertinent positive and negative review of systems were noted in the above HPI section.  All other review of systems was otherwise negative.Marland Kitchen  Physical Exam: Vital signs in last 24 hours: Temp:  [98.9 F (37.2 C)-99.9 F (37.7 C)] 99 F (37.2 C) (01/02 0526) Pulse Rate:  [74-99] 84 (01/02 0526) Resp:  [17-20] 17 (01/02 0526) BP: (114-152)/(60-89) 152/70 mmHg (01/02 0526) SpO2:  [95 %-99 %] 99 % (01/02 0805) Last BM Date: 09/13/15 General:   Alert,  Well-developed, obese Hispanic female , pleasant and cooperative in NAD Head:  Normocephalic and atraumatic. Eyes:  Sclera clear, no icterus.   Conjunctiva pink. Ears:  Normal auditory acuity. Nose:  No deformity, discharge,  or lesions. Mouth:  No deformity or lesions.   Neck:  Supple; no masses or thyromegaly. Lungs:  Clear throughout to auscultation.    No wheezes, crackles, or rhonchi. Heart:  Regular rate and rhythm; no murmurs, clicks, rubs,  or gallops. Abdomen:  Soft,tender RUQ, no guarding or rebound, BS active,nonpalp mass or hsm.   Rectal:  Deferred  Msk:  Symmetrical without gross deformities. . Pulses:  Normal pulses noted. Extremities:  Without clubbing or edema. Neurologic:  Alert and  oriented x4;  grossly normal neurologically. Skin:  Intact without significant lesions or rashes.. Psych:  Alert and cooperative. Normal mood and affect.  Intake/Output from previous day: 01/01 0701 - 01/02 0700 In: 1901.3 [I.V.:1901.3] Out: 2 [Urine:2] Intake/Output this shift: Total I/O In: 278.3 [I.V.:278.3] Out: -   Lab Results:  Recent Labs  09/13/15 0945  WBC 11.1*  HGB 13.0  HCT 39.8  PLT 453*   BMET  Recent Labs  09/13/15 0945 09/14/15 0347  NA 139 141  K 4.0 2.9*  CL 102 106  CO2 27 24  GLUCOSE 138* 72  BUN 11 7  CREATININE 0.71 0.64  CALCIUM 9.1 8.4*   LFT  Recent Labs  09/14/15 0347  PROT 6.8  ALBUMIN 3.0*  AST 46*  ALT 44  ALKPHOS 108  BILITOT 0.6   PT/INR No results for input(s): LABPROT, INR in the last 72 hours. Hepatitis Panel No results for input(s): HEPBSAG, HCVAB, HEPAIGM, HEPBIGM in the last 72 hours.   IMPRESSION:   #2 45 year old Hispanic female with acute severe right upper quadrant pain associated with nausea and vomiting onset 24 hours ago. Minimally elevated alkaline phosphatase now normalized Dilated CBD on ultrasound, MRCP is pending--rule out choledocholithiasis  #2 the onset diabetes mellitus #3 obesity #4 hypertension #5 compression fractures #6 asthma   PLAN:  #1 await MRI /MRCP result #2 clear liquid diet #3 IV PPI Further plans and indication for ERCP pending view of MRCP. Will need to discuss in detail via interpreter.   Amy Esterwood  09/14/2015, 9:58 AM

## 2015-09-14 NOTE — Progress Notes (Signed)
Subjective: Patient has had some persistent RUQ pain but denies any vomiting episodes. She says that he pain control regimen has been adequate.    Objective: Vital signs in last 24 hours: Filed Vitals:   09/13/15 1719 09/13/15 2150 09/14/15 0526 09/14/15 0805  BP: 138/69 143/72 152/70   Pulse: 80 99 84   Temp: 99.2 F (37.3 C) 99.9 F (37.7 C) 99 F (37.2 C)   TempSrc: Oral Oral    Resp: _0 Height:      Weight:      SpO2: 97% 95% 96% 99%   Weight change:   Intake/Output Summary (Last 24 hours) at 09/14/15 1216 Last data filed at 09/14/15 0916  Gross per 24 hour  Intake 2179.66 ml  Output      2 ml  Net 2177.66 ml   Physical Exam: General: Lying in bed, NAD HEENT: Sclerae anicteric. Enlarged neck circumference. Cardiovascular: RRR, no murmurs, rubs, or gallops Pulmonary: Scattered expiratory wheezes. No crackles or rhonchi. Unlabored breathing. Abdominal: Obese. Striae. Tenderness to palpation in RUQ.  No rebound. No masses. Extremities: Trace edema in lower extremities. No calf tenderness or swelling.  Skin: No rashes. No hirsutism.   Lab Results: Basic Metabolic Panel:  Recent Labs Lab 09/13/15 0945 09/14/15 0347  NA 139 141  K 4.0 2.9*  CL 102 106  CO2 27 24  GLUCOSE 138* 72  BUN 11 7  CREATININE 0.71 0.64  CALCIUM 9.1 8.4*   Liver Function Tests:  Recent Labs Lab 09/13/15 0945 09/14/15 0347  AST 41 46*  ALT 48 44  ALKPHOS 127* 108  BILITOT 0.4 0.6  PROT 7.7 6.8  ALBUMIN 3.5 3.0*    Recent Labs Lab 09/13/15 0945  LIPASE 33   CBC:  Recent Labs Lab 09/13/15 0945  WBC 11.1*  HGB 13.0  HCT 39.8  MCV 82.1  PLT 453*   CBG:  Recent Labs Lab 09/13/15 2146 09/13/15 2226 09/14/15 0516 09/14/15 0524 09/14/15 0817 09/14/15 1135  GLUCAP 57* 114* 66 116* 68 92   Urinalysis:  Recent Labs Lab 09/13/15 1001  COLORURINE YELLOW  LABSPEC 1.025  PHURINE 5.0  GLUCOSEU NEGATIVE  HGBUR LARGE*  BILIRUBINUR NEGATIVE    KETONESUR NEGATIVE  PROTEINUR 30*  NITRITE NEGATIVE  LEUKOCYTESUR TRACE*   Micro Results: Recent Results (from the past 240 hour(s))  Culture, Group A Strep     Status: Abnormal   Collection Time: 09/09/15  6:30 PM  Result Value Ref Range Status   Strep A Culture Positive (A)  Final    Comment: (NOTE) Penicillin and ampicillin are drugs of choice for treatment of beta-hemolytic streptococcal infections. Susceptibility testing of penicillins and other beta-lactam agents approved by the FDA for treatment of beta-hemolytic streptococcal infections need not be performed routinely because nonsusceptible isolates are extremely rare in any beta-hemolytic streptococcus and have not been reported for Streptococcus pyogenes (group A). (CLSI 2011) Performed At: [facility] [place], [place] [id number] Lindon Romp MD [id number]    Studies/Results: US Abdomen Complete  09/13/2015  CLINICAL DATA:  45 year old female with acute abdominal pain for 1 day. EXAM: ABDOMEN ULTRASOUND COMPLETE COMPARISON:  None. FINDINGS: Gallbladder: The gallbladder is unremarkable. There is no evidence of cholelithiasis or acute cholecystitis. Common bile duct: Diameter: 8 mm. No abnormalities of the visualized CBD identified. Liver: Increased echogenicity noted without focal abnormality. IVC: No abnormality visualized. Pancreas: Visualized portion unremarkable. Spleen: Size and appearance within normal limits. Right Kidney: Length:  12.9 cm. Echogenicity within normal limits. No mass or hydronephrosis visualized. Left Kidney: Length: 12.5 cm. Echogenicity within normal limits. No mass or hydronephrosis visualized. Abdominal aorta: No aneurysm visualized. Other findings: None. IMPRESSION: Mild CBD dilatation without identifiable obstructing cause of the visualized CBD. Consider further evaluation with ERCP/ MRCP as indicated. Hepatic steatosis. No other significant abnormalities. Electronically  Signed   By: Margarette Canada M.D.   On: 09/13/2015 13:21   Dg Abd Acute W/chest  09/13/2015  CLINICAL DATA:  Patient with right-sided abdominal pain and nausea. EXAM: DG ABDOMEN ACUTE W/ 1V CHEST COMPARISON:  Chest radiograph 09/16/2014. FINDINGS: Stable cardiac and mediastinal contours. No consolidative pulmonary opacities. No pleural effusion or pneumothorax. Gas is demonstrated within nondilated loops of large and small bowel in a nonobstructed pattern. No free intraperitoneal air. Apparent chronic healing fracture of the left inferior pubic ramus. SI joints are unremarkable. Lumbar spine degenerative changes. IMPRESSION: Nonobstructed bowel gas pattern. No acute cardiopulmonary process. Electronically Signed   By: Lovey Newcomer M.D.   On: 09/13/2015 16:10   Medications: I have reviewed the patient's current medications. Scheduled Meds: . amoxicillin  500 mg Oral Q12H  . aspirin EC  325 mg Oral Daily  . atorvastatin  20 mg Oral q1800  . cholecalciferol  1,000 Units Oral Daily  . cloNIDine  0.2 mg Oral TID  . enoxaparin (LOVENOX) injection  40 mg Subcutaneous Q24H  . famotidine  40 mg Oral QHS  . furosemide  40 mg Oral q morning - 10a  . gabapentin  800 mg Oral BID  . losartan  100 mg Oral Daily  . mometasone-formoterol  2 puff Inhalation BID  . montelukast  10 mg Oral QHS  . pantoprazole  40 mg Oral Daily  . PARoxetine  20 mg Oral Daily  . predniSONE  4 mg Oral Q breakfast  . sodium chloride  3 mL Intravenous Q12H  . verapamil  360 mg Oral Daily   Continuous Infusions: . sodium chloride 1 mL (09/14/15 0623)   PRN Meds:.sodium chloride, acetaminophen **OR** acetaminophen, acetaminophen-codeine, albuterol, alum & mag hydroxide-simeth, clonazePAM, dextrose, HYDROmorphone (DILAUDID) injection, LORazepam, methocarbamol, ondansetron **OR** ondansetron (ZOFRAN) IV, sodium chloride Assessment/Plan:  Right Upper Quadrant Abdominal Pain: Cholestatic etiology, perhaps choledocholithiasis appears to  be the most likely culprit given CBD dilatation and elevate alkaline phophatase (although alk phos has been higher, 133 in 2015). GGT also elevated. Normal lipase and normal LFTs lower my suspicions for gallstone pancreatitis and an acute hepatitis, respectively. If unremarkable, a HIDA scan may be warranted as an outpatient. - MRCP interpretation pending - Hydromorphone 0.25 mg q4h prn pain - Maalox prn, Zofran prn - Clear liquid diet per GI recs  Hypoglycemia: BGs in 60s this morning. She remains asymptomatic. She has not received any insulin yet during hospitalization.  - Hold insulin for now - D50 7m for BGs <70, not to exceed four administrations a day  History of Asthma: Scattered wheezing on exam. PFTs in 10/2014 show no obstructive airway disease, although this could may been confounded by her chronic prednisone therapy.  - Albuterol nebulizer prn - Dulera 2 puffs BID - Singulair 10 mg daily - Prednisone 4 mg daily, attempts have been made to wean in the past  Type 2 Diabetes with Neuropathy: Patient is on chronic steroid therapy, thus worsening her T2DM. She is on an aggressive insulin regimen at home Novolog 80U TID with meals and 80U Lantus at bedtime. We are holding insulin given hypoglycemia. - Gabapentin 800  mg daily  HTN: Stable. Continue home medications - Clonidine 0.2 mg TID, Losartan 100 mg daily, Verapamil 360 mg 24 hr capsule daily  Grade 2 Diastolic CHF: Trace edema on exam - Furosemide 40 mg daily.   Strep A Pharyngitis: No tonsillar exudate or erythema appreciated on exam. - Amoxicillin 500 mg BID for total 10 day course (end 09/18/2015)  Depression/Anxiety:  - Clonazepam 0.5 mg BID   Dispo: Disposition is deferred at this time, awaiting improvement of current medical problems.  Anticipated discharge in approximately 0-1 day(s).   The patient does have a current PCP (Boykin Nearing, MD) and does not need an [facility] hospital follow-up appointment after  discharge.  The patient does not have transportation limitations that hinder transportation to clinic appointments.  .Services Needed at time of discharge: Y = Yes, Blank = No PT:   OT:   RN:   Equipment:   Other:       Liberty Handy, MD 09/14/2015, 12:16 PM

## 2015-09-15 ENCOUNTER — Telehealth: Payer: Self-pay

## 2015-09-15 DIAGNOSIS — R1011 Right upper quadrant pain: Secondary | ICD-10-CM | POA: Diagnosis not present

## 2015-09-15 LAB — BASIC METABOLIC PANEL
Anion gap: 10 (ref 5–15)
BUN: <5 mg/dL — ABNORMAL LOW (ref 6–20)
CO2: 27 mmol/L (ref 22–32)
Calcium: 8.5 mg/dL — ABNORMAL LOW (ref 8.9–10.3)
Chloride: 102 mmol/L (ref 101–111)
Creatinine, Ser: 0.78 mg/dL (ref 0.44–1.00)
GFR calc Af Amer: >60 mL/min (ref >60)
GFR calc non Af Amer: >60 mL/min (ref >60)
Glucose, Bld: 107 mg/dL — ABNORMAL HIGH (ref 65–99)
Potassium: 3.6 mmol/L (ref 3.5–5.1)
Sodium: 139 mmol/L (ref 135–145)

## 2015-09-15 LAB — CBC
HCT: 36.7 % (ref 36.0–46.0)
Hemoglobin: 12 g/dL (ref 12.0–15.0)
MCH: 27 pg (ref 26.0–34.0)
MCHC: 32.7 g/dL (ref 30.0–36.0)
MCV: 82.5 fL (ref 78.0–100.0)
Platelets: 397 10*3/uL (ref 150–400)
RBC: 4.45 MIL/uL (ref 3.87–5.11)
RDW: 15.1 % (ref 11.5–15.5)
WBC: 7.8 10*3/uL (ref 4.0–10.5)

## 2015-09-15 LAB — GLUCOSE, CAPILLARY
Glucose-Capillary: 92 mg/dL (ref 65–99)
Glucose-Capillary: 107 mg/dL — ABNORMAL HIGH (ref 65–99)
Glucose-Capillary: 161 mg/dL — ABNORMAL HIGH (ref 65–99)
Glucose-Capillary: 130 mg/dL — ABNORMAL HIGH (ref 65–99)

## 2015-09-15 MED ORDER — ALUM & MAG HYDROXIDE-SIMETH 200-200-20 MG/5ML PO SUSP: 30.0000 mL | Freq: Four times a day (QID) | ORAL | Status: DC | PRN

## 2015-09-15 MED ORDER — ONDANSETRON HCL 4 MG PO TABS: 4.0000 mg | ORAL_TABLET | Freq: Four times a day (QID) | ORAL | Status: DC | PRN

## 2015-09-15 NOTE — Discharge Instructions (Signed)
It was a pleasure taking care of you in the hospital. You are likely having abdominal pain due to hepatic steatosis. You were given instructions in Spanish on more information. If your symptoms come back, please seek medical attention because you may need another kind of imaging. I've scheduled an appointment with your primary doctor, Dr. Adrian Blackwater.

## 2015-09-15 NOTE — Progress Notes (Signed)
Subjective: Patient reports no abdominal pain this morning and is tolerating clears. She is wondering when she can go home.    Objective: Vital signs in last 24 hours: Filed Vitals:   09/14/15 1406 09/14/15 2032 09/14/15 2100 09/15/15 0550  BP: 110/59 103/55  106/55  Pulse: 92 65  77  Temp: 98.8 F (37.1 C) 98.4 F (36.9 C)  98.5 F (36.9 C)  TempSrc: Oral Oral  Oral  Resp: 17 18  17   Height:      Weight:      SpO2: 99% 97% 99% 98%   Weight change:   Intake/Output Summary (Last 24 hours) at 09/15/15 0934 Last data filed at 09/14/15 1734  Gross per 24 hour  Intake 1401.67 ml  Output    650 ml  Net 751.67 ml   Physical Exam: General: Lying in bed, NAD HEENT: Sclerae anicteric. Enlarged neck circumference. Cardiovascular: RRR, no murmurs, rubs, or gallops Pulmonary: Lungs clear to auscultation. No crackles or rhonchi. Unlabored breathing. Abdominal: Obese. Striae. No tenderness to palpation or masses. Normal bowel sounds Extremities: Trace edema in lower extremities. No calf tenderness or swelling.  Skin: No rashes. No hirsutism.   Lab Results: Basic Metabolic Panel:  Recent Labs Lab 09/14/15 0347 09/15/15 0540  NA 141 139  K 2.9* 3.6  CL 106 102  CO2 24 27  GLUCOSE 72 107*  BUN 7 PENDING  CREATININE 0.64 0.78  CALCIUM 8.4* 8.5*   Liver Function Tests:  Recent Labs Lab 09/13/15 0945 09/14/15 0347  AST 41 46*  ALT 48 44  ALKPHOS 127* 108  BILITOT 0.4 0.6  PROT 7.7 6.8  ALBUMIN 3.5 3.0*    Recent Labs Lab 09/13/15 0945  LIPASE 33   CBC:  Recent Labs Lab 09/13/15 0945 09/15/15 0540  WBC 11.1* 7.8  HGB 13.0 12.0  HCT 39.8 36.7  MCV 82.1 82.5  PLT 453* 397   CBG:  Recent Labs Lab 09/14/15 1135 09/14/15 1610 09/14/15 2031 09/14/15 2357 09/15/15 0347 09/15/15 0731  GLUCAP 92 156* 100* 93 92 107*   Urinalysis:  Recent Labs Lab 09/13/15 1001  COLORURINE YELLOW  LABSPEC 1.025  PHURINE 5.0  GLUCOSEU NEGATIVE  HGBUR  LARGE*  BILIRUBINUR NEGATIVE  KETONESUR NEGATIVE  PROTEINUR 30*  NITRITE NEGATIVE  LEUKOCYTESUR TRACE*   Micro Results: Recent Results (from the past 240 hour(s))  Culture, Group A Strep     Status: Abnormal   Collection Time: 09/09/15  6:30 PM  Result Value Ref Range Status   Strep A Culture Positive (A)  Final    Comment: (NOTE) Penicillin and ampicillin are drugs of choice for treatment of beta-hemolytic streptococcal infections. Susceptibility testing of penicillins and other beta-lactam agents approved by the FDA for treatment of beta-hemolytic streptococcal infections need not be performed routinely because nonsusceptible isolates are extremely rare in any beta-hemolytic streptococcus and have not been reported for Streptococcus pyogenes (group A). (CLSI 2011) Performed At: [facility], [place] [id number] Lindon Romp MD [id number]    Studies/Results: US Abdomen Complete  09/13/2015  CLINICAL DATA:  45 year old female with acute abdominal pain for 1 day. EXAM: ABDOMEN ULTRASOUND COMPLETE COMPARISON:  None. FINDINGS: Gallbladder: The gallbladder is unremarkable. There is no evidence of cholelithiasis or acute cholecystitis. Common bile duct: Diameter: 8 mm. No abnormalities of the visualized CBD identified. Liver: Increased echogenicity noted without focal abnormality. IVC: No abnormality visualized. Pancreas: Visualized portion unremarkable. Spleen: Size and appearance within normal limits. Right Kidney:  Length: 12.9 cm. Echogenicity within normal limits. No mass or hydronephrosis visualized. Left Kidney: Length: 12.5 cm. Echogenicity within normal limits. No mass or hydronephrosis visualized. Abdominal aorta: No aneurysm visualized. Other findings: None. IMPRESSION: Mild CBD dilatation without identifiable obstructing cause of the visualized CBD. Consider further evaluation with ERCP/ MRCP as indicated. Hepatic steatosis. No other significant  abnormalities. Electronically Signed   By: Margarette Canada M.D.   On: 09/13/2015 13:21   Mr Abdomen Mrcp Wo Cm  09/15/2015  CLINICAL DATA:  45 year old female with recent history of abdominal pain. Abdominal pain began at 3 a.m. yesterday evening after large meal, and is described as sharp, constant pain in the right upper quadrant. No prior history of this pain. Decreased appetite. Eleven episodes of nonbloody, non ileus emesis. 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 EXAM: MRI ABDOMEN WITHOUT CONTRAST  (INCLUDING MRCP) TECHNIQUE: Multiplanar multisequence MR imaging of the abdomen was performed. Heavily T2-weighted images of the biliary and pancreatic ducts were obtained, and three-dimensional MRCP images were rendered by post processing. COMPARISON:  Abdominal ultrasound 09/13/2015. No prior abdominal MRI. FINDINGS: Lower chest:  Unremarkable. Hepatobiliary: Diffuse heterogeneous loss of signal intensity throughout the hepatic parenchyma on out of phase dual echo images, compatible with a background of heterogeneous hepatic steatosis. No cystic or solid hepatic lesions. No intra or extrahepatic biliary ductal dilatation on MRCP images. Common bile duct is normal in caliber measuring 6 mm in the porta hepatis. There are no filling defects within the gallbladder to suggest gallstones. No filling defects in the common bile duct to suggest choledocholithiasis. Pancreas: No pancreatic mass. No pancreatic ductal dilatation. No pancreatic or peripancreatic fluid or inflammatory changes. Spleen: 1.9 cm well-defined T1 hypointense, T2 hyperintense lesion in the upper pole of the spleen is incompletely characterized on today's noncontrast examination, but likely a benign lesion such as a cyst or hemangioma. Adrenals/Urinary Tract: Bilateral adrenal glands and bilateral kidneys are normal in appearance on today's noncontrast  examination. No hydroureteronephrosis in the visualized abdomen. Stomach/Bowel: Visualized portions are unremarkable. Vascular/Lymphatic: No aneurysm identified in the visualized abdominal vasculature. No lymphadenopathy noted in the visualized abdomen. Other: No significant volume of ascites in the visualized peritoneal cavity. Musculoskeletal: No aggressive appearing osseous lesions noted visualized portions of the skeleton. IMPRESSION: 1. No evidence of gallstones. No findings to suggest acute cholecystitis. No choledocholithiasis. No findings to suggest biliary tract obstruction. 2. Severe heterogeneous hepatic steatosis. Electronically Signed   By: Vinnie Langton M.D.   On: 09/15/2015 08:04   Mr 3d Recon At Scanner  09/15/2015  CLINICAL DATA:  45 year old female with recent history of abdominal pain. Abdominal pain began at 3 a.m. yesterday evening after large meal, and is described as sharp, constant pain in the right upper quadrant. No prior history of this pain. Decreased appetite. Eleven episodes of nonbloody, non ileus emesis. 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 EXAM: MRI ABDOMEN WITHOUT CONTRAST  (INCLUDING MRCP) TECHNIQUE: Multiplanar multisequence MR imaging of the abdomen was performed. Heavily T2-weighted images of the biliary and pancreatic ducts were obtained, and three-dimensional MRCP images were rendered by post processing. COMPARISON:  Abdominal ultrasound 09/13/2015. No prior abdominal MRI. FINDINGS: Lower chest:  Unremarkable. Hepatobiliary: Diffuse heterogeneous loss of signal intensity throughout the hepatic parenchyma on out of phase dual echo images, compatible with a background of heterogeneous hepatic steatosis. No cystic or solid hepatic lesions. No intra or extrahepatic biliary ductal  dilatation on MRCP images. Common bile duct is normal in caliber measuring 6 mm in the porta hepatis.  There are no filling defects within the gallbladder to suggest gallstones. No filling defects in the common bile duct to suggest choledocholithiasis. Pancreas: No pancreatic mass. No pancreatic ductal dilatation. No pancreatic or peripancreatic fluid or inflammatory changes. Spleen: 1.9 cm well-defined T1 hypointense, T2 hyperintense lesion in the upper pole of the spleen is incompletely characterized on today's noncontrast examination, but likely a benign lesion such as a cyst or hemangioma. Adrenals/Urinary Tract: Bilateral adrenal glands and bilateral kidneys are normal in appearance on today's noncontrast examination. No hydroureteronephrosis in the visualized abdomen. Stomach/Bowel: Visualized portions are unremarkable. Vascular/Lymphatic: No aneurysm identified in the visualized abdominal vasculature. No lymphadenopathy noted in the visualized abdomen. Other: No significant volume of ascites in the visualized peritoneal cavity. Musculoskeletal: No aggressive appearing osseous lesions noted visualized portions of the skeleton. IMPRESSION: 1. No evidence of gallstones. No findings to suggest acute cholecystitis. No choledocholithiasis. No findings to suggest biliary tract obstruction. 2. Severe heterogeneous hepatic steatosis. Electronically Signed   By: Vinnie Langton M.D.   On: 09/15/2015 08:04   Dg Abd Acute W/chest  09/13/2015  CLINICAL DATA:  Patient with right-sided abdominal pain and nausea. EXAM: DG ABDOMEN ACUTE W/ 1V CHEST COMPARISON:  Chest radiograph 09/16/2014. FINDINGS: Stable cardiac and mediastinal contours. No consolidative pulmonary opacities. No pleural effusion or pneumothorax. Gas is demonstrated within nondilated loops of large and small bowel in a nonobstructed pattern. No free intraperitoneal air. Apparent chronic healing fracture of the left inferior pubic ramus. SI joints are unremarkable. Lumbar spine degenerative changes. IMPRESSION: Nonobstructed bowel gas pattern. No acute  cardiopulmonary process. Electronically Signed   By: Lovey Newcomer M.D.   On: 09/13/2015 16:10   Medications: I have reviewed the patient's current medications. Scheduled Meds: . amoxicillin  500 mg Oral Q12H  . aspirin EC  325 mg Oral Daily  . atorvastatin  20 mg Oral q1800  . cholecalciferol  1,000 Units Oral Daily  . cloNIDine  0.2 mg Oral TID  . enoxaparin (LOVENOX) injection  40 mg Subcutaneous Q24H  . famotidine  40 mg Oral QHS  . furosemide  40 mg Oral q morning - 10a  . gabapentin  800 mg Oral BID  . losartan  100 mg Oral Daily  . mometasone-formoterol  2 puff Inhalation BID  . montelukast  10 mg Oral QHS  . pantoprazole  40 mg Oral Daily  . PARoxetine  20 mg Oral Daily  . predniSONE  4 mg Oral Q breakfast  . sodium chloride  3 mL Intravenous Q12H  . verapamil  360 mg Oral Daily   Continuous Infusions:   PRN Meds:.sodium chloride, acetaminophen **OR** acetaminophen, acetaminophen-codeine, albuterol, alum & mag hydroxide-simeth, clonazePAM, dextrose, LORazepam, methocarbamol, ondansetron **OR** ondansetron (ZOFRAN) IV, sodium chloride Assessment/Plan:  Right Upper Quadrant Abdominal Pain: MRCP only remarkable for severe hepatic steatosis, likely related to her obesity and significant insulin resistance. Pain has resolved. Likely discharge today. - Discontinue Hydromorphone 0.25 mg q4h prn pain - Maalox prn, Zofran prn - Regular diet  Hypoglycemia: BGs 92-107. She remains asymptomatic. She has not received any insulin yet during hospitalization. Will ascertain response to regular diet and restart on home regimen. - Hold insulin for now - D50 68ml for BGs <70, not to exceed four administrations a day  History of Asthma: Scattered wheezing on exam on admission, resolved today. PFTs in 10/2014 show no obstructive airway disease, although this  could may been confounded by her chronic prednisone therapy.  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 She is on an aggressive insulin regimen at home Novolog 80U TID with meals and 80U Lantus at bedtime. We are holding insulin given previous hypoglycemia, and will restart after she's tolerating a regular diet. - Gabapentin 800 mg daily  HTN: Stable. Continue home medications - Clonidine 0.2 mg TID, Losartan 100 mg daily, Verapamil 360 mg 24 hr capsule daily  Grade 2 Diastolic CHF: Trace edema on exam - Furosemide 40 mg daily.   Strep A Pharyngitis: No tonsillar exudate or erythema appreciated on exam. - Amoxicillin 500 mg BID for total 10 day course (end 09/18/2015)  Depression/Anxiety:  - Clonazepam 0.5 mg BID   Dispo: Disposition is deferred at this time, awaiting improvement of current medical problems.  Anticipated discharge in approximately 0-1 day(s).   The patient does have a current PCP (Boykin Nearing, MD) and does not need an [facility] hospital follow-up appointment after discharge.  The patient does not have transportation limitations that hinder transportation to clinic appointments.  .Services Needed at time of discharge: Y = Yes, Blank = No PT:   OT:   RN:   Equipment:   Other:       Liberty Handy, MD 09/15/2015, 9:34 AM

## 2015-09-15 NOTE — Telephone Encounter (Signed)
Call received from Ellan Lambert, RN CM  Requesting a hospital follow up appointment for the patient. An appointment has been scheduled for 09/25/15@ 1200 with Dr Adrian Blackwater and the information has been placed on the AVS.  Update provided to J. Oren Section, RN CM.

## 2015-09-15 NOTE — Progress Notes (Signed)
First available appointment at [facility] with Dr. Adrian Blackwater is January 13 at 12:00pm.  Appointment information is on AVS in Epic.    Reinaldo Raddle, RN, BSN  Trauma/Neuro ICU Case Manager [phone]

## 2015-09-15 NOTE — Progress Notes (Signed)
Subjective: No pain, nausea, or vomiting.  Objective: Vital signs in last 24 hours: Temp:  [98.4 F (36.9 C)-98.8 F (37.1 C)] 98.5 F (36.9 C) (01/03 0550) Pulse Rate:  [65-95] 95 (01/03 1051) Resp:  [17-18] 18 (01/03 1051) BP: (103-147)/(55-71) 147/71 mmHg (01/03 1051) SpO2:  [95 %-99 %] 95 % (01/03 1051) Last BM Date: 09/13/15  Intake/Output from previous day: 01/02 0701 - 01/03 0700 In: 1680 [P.O.:1020; I.V.:660] Out: 850 [Urine:850] Intake/Output this shift: Total I/O In: 480 [P.O.:480] Out: -   General appearance: alert and no distress GI: soft, non-tender; bowel sounds normal; no masses,  no organomegaly  Lab Results:  Recent Labs  09/13/15 0945 09/15/15 0540  WBC 11.1* 7.8  HGB 13.0 12.0  HCT 39.8 36.7  PLT 453* 397   BMET  Recent Labs  09/13/15 0945 09/14/15 0347 09/15/15 0540  NA 139 141 139  K 4.0 2.9* 3.6  CL 102 106 102  CO2 27 24 27   GLUCOSE 138* 72 107*  BUN 11 7 <5*  CREATININE 0.71 0.64 0.78  CALCIUM 9.1 8.4* 8.5*   LFT  Recent Labs  09/14/15 0347  PROT 6.8  ALBUMIN 3.0*  AST 46*  ALT 44  ALKPHOS 108  BILITOT 0.6   PT/INR No results for input(s): LABPROT, INR in the last 72 hours. Hepatitis Panel No results for input(s): HEPBSAG, HCVAB, HEPAIGM, HEPBIGM in the last 72 hours. C-Diff No results for input(s): CDIFFTOX in the last 72 hours. Fecal Lactopherrin No results for input(s): FECLLACTOFRN in the last 72 hours.  Studies/Results: US Abdomen Complete  09/13/2015  CLINICAL DATA:  45 year old female with acute abdominal pain for 1 day. EXAM: ABDOMEN ULTRASOUND COMPLETE COMPARISON:  None. FINDINGS: Gallbladder: The gallbladder is unremarkable. There is no evidence of cholelithiasis or acute cholecystitis. Common bile duct: Diameter: 8 mm. No abnormalities of the visualized CBD identified. Liver: Increased echogenicity noted without focal abnormality. IVC: No abnormality visualized. Pancreas: Visualized portion unremarkable.  Spleen: Size and appearance within normal limits. Right Kidney: Length: 12.9 cm. Echogenicity within normal limits. No mass or hydronephrosis visualized. Left Kidney: Length: 12.5 cm. Echogenicity within normal limits. No mass or hydronephrosis visualized. Abdominal aorta: No aneurysm visualized. Other findings: None. IMPRESSION: Mild CBD dilatation without identifiable obstructing cause of the visualized CBD. Consider further evaluation with ERCP/ MRCP as indicated. Hepatic steatosis. No other significant abnormalities. Electronically Signed   By: Margarette Canada M.D.   On: 09/13/2015 13:21   Mr Abdomen Mrcp Wo Cm  09/15/2015  CLINICAL DATA:  45 year old female with recent history of abdominal pain. Abdominal pain began at 3 a.m. yesterday evening after large meal, and is described as sharp, constant pain in the right upper quadrant. No prior history of this pain. Decreased appetite. Eleven episodes of nonbloody, non ileus emesis. 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 EXAM: MRI ABDOMEN WITHOUT CONTRAST  (INCLUDING MRCP) TECHNIQUE: Multiplanar multisequence MR imaging of the abdomen was performed. Heavily T2-weighted images of the biliary and pancreatic ducts were obtained, and three-dimensional MRCP images were rendered by post processing. COMPARISON:  Abdominal ultrasound 09/13/2015. No prior abdominal MRI. FINDINGS: Lower chest:  Unremarkable. Hepatobiliary: Diffuse heterogeneous loss of signal intensity throughout the hepatic parenchyma on out of phase dual echo images, compatible with a background of heterogeneous hepatic steatosis. No cystic or solid hepatic lesions. No intra or extrahepatic biliary ductal dilatation on MRCP images. Common bile duct is normal in caliber measuring 6 mm in the  porta hepatis. There are no filling defects within the gallbladder to suggest gallstones. No filling defects in the common bile  duct to suggest choledocholithiasis. Pancreas: No pancreatic mass. No pancreatic ductal dilatation. No pancreatic or peripancreatic fluid or inflammatory changes. Spleen: 1.9 cm well-defined T1 hypointense, T2 hyperintense lesion in the upper pole of the spleen is incompletely characterized on today's noncontrast examination, but likely a benign lesion such as a cyst or hemangioma. Adrenals/Urinary Tract: Bilateral adrenal glands and bilateral kidneys are normal in appearance on today's noncontrast examination. No hydroureteronephrosis in the visualized abdomen. Stomach/Bowel: Visualized portions are unremarkable. Vascular/Lymphatic: No aneurysm identified in the visualized abdominal vasculature. No lymphadenopathy noted in the visualized abdomen. Other: No significant volume of ascites in the visualized peritoneal cavity. Musculoskeletal: No aggressive appearing osseous lesions noted visualized portions of the skeleton. IMPRESSION: 1. No evidence of gallstones. No findings to suggest acute cholecystitis. No choledocholithiasis. No findings to suggest biliary tract obstruction. 2. Severe heterogeneous hepatic steatosis. Electronically Signed   By: Vinnie Langton M.D.   On: 09/15/2015 08:04   Mr 3d Recon At Scanner  09/15/2015  CLINICAL DATA:  45 year old female with recent history of abdominal pain. Abdominal pain began at 3 a.m. yesterday evening after large meal, and is described as sharp, constant pain in the right upper quadrant. No prior history of this pain. Decreased appetite. Eleven episodes of nonbloody, non ileus emesis. 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 EXAM: MRI ABDOMEN WITHOUT CONTRAST  (INCLUDING MRCP) TECHNIQUE: Multiplanar multisequence MR imaging of the abdomen was performed. Heavily T2-weighted images of the biliary and pancreatic ducts were obtained, and three-dimensional MRCP images were rendered  by post processing. COMPARISON:  Abdominal ultrasound 09/13/2015. No prior abdominal MRI. FINDINGS: Lower chest:  Unremarkable. Hepatobiliary: Diffuse heterogeneous loss of signal intensity throughout the hepatic parenchyma on out of phase dual echo images, compatible with a background of heterogeneous hepatic steatosis. No cystic or solid hepatic lesions. No intra or extrahepatic biliary ductal dilatation on MRCP images. Common bile duct is normal in caliber measuring 6 mm in the porta hepatis. There are no filling defects within the gallbladder to suggest gallstones. No filling defects in the common bile duct to suggest choledocholithiasis. Pancreas: No pancreatic mass. No pancreatic ductal dilatation. No pancreatic or peripancreatic fluid or inflammatory changes. Spleen: 1.9 cm well-defined T1 hypointense, T2 hyperintense lesion in the upper pole of the spleen is incompletely characterized on today's noncontrast examination, but likely a benign lesion such as a cyst or hemangioma. Adrenals/Urinary Tract: Bilateral adrenal glands and bilateral kidneys are normal in appearance on today's noncontrast examination. No hydroureteronephrosis in the visualized abdomen. Stomach/Bowel: Visualized portions are unremarkable. Vascular/Lymphatic: No aneurysm identified in the visualized abdominal vasculature. No lymphadenopathy noted in the visualized abdomen. Other: No significant volume of ascites in the visualized peritoneal cavity. Musculoskeletal: No aggressive appearing osseous lesions noted visualized portions of the skeleton. IMPRESSION: 1. No evidence of gallstones. No findings to suggest acute cholecystitis. No choledocholithiasis. No findings to suggest biliary tract obstruction. 2. Severe heterogeneous hepatic steatosis. Electronically Signed   By: Vinnie Langton M.D.   On: 09/15/2015 08:04   Dg Abd Acute W/chest  09/13/2015  CLINICAL DATA:  Patient with right-sided abdominal pain and nausea. EXAM: DG ABDOMEN  ACUTE W/ 1V CHEST COMPARISON:  Chest radiograph 09/16/2014. FINDINGS: Stable cardiac and mediastinal contours. No consolidative pulmonary opacities. No pleural effusion or pneumothorax. Gas is demonstrated within nondilated loops of large and small bowel  in a nonobstructed pattern. No free intraperitoneal air. Apparent chronic healing fracture of the left inferior pubic ramus. SI joints are unremarkable. Lumbar spine degenerative changes. IMPRESSION: Nonobstructed bowel gas pattern. No acute cardiopulmonary process. Electronically Signed   By: Lovey Newcomer M.D.   On: 09/13/2015 16:10    Medications:  Scheduled: . amoxicillin  500 mg Oral Q12H  . aspirin EC  325 mg Oral Daily  . atorvastatin  20 mg Oral q1800  . cholecalciferol  1,000 Units Oral Daily  . cloNIDine  0.2 mg Oral TID  . enoxaparin (LOVENOX) injection  40 mg Subcutaneous Q24H  . famotidine  40 mg Oral QHS  . furosemide  40 mg Oral q morning - 10a  . gabapentin  800 mg Oral BID  . losartan  100 mg Oral Daily  . mometasone-formoterol  2 puff Inhalation BID  . montelukast  10 mg Oral QHS  . pantoprazole  40 mg Oral Daily  . PARoxetine  20 mg Oral Daily  . predniSONE  4 mg Oral Q breakfast  . sodium chloride  3 mL Intravenous Q12H  . verapamil  360 mg Oral Daily   Continuous:   Assessment/Plan: 1) RUQ pain - resolved. 2) Elevated AP. 3) Nausea/vomiting - resolved.   Clinically she is better.  No further pain with deep palpation to the RUQ.  The MRCP was negative for stones in the biliary system.  Her AP elevation is as a result of steatosis.  Her English is limited, but she mentioned issues about 10 fractures in her back one year ago.  I was not able to discern if she was implying that this had an issues with her abdominal pain.  Clinically she is stable and no further evaluation is required.  If necessary a HIDA scan can be pursued if she has a recurrence of her symptoms.  Plan: 1) HIDA if indicated. 2) No further GI work up  at this time.  Signing off.    Keifer Habib D 09/15/2015, 11:13 AM

## 2015-09-15 NOTE — Discharge Summary (Signed)
Name: Raven Turner MRN: [id number] DOB: 1971/07/07 45 y.o. PCP: Boykin Nearing, MD  Date of Admission: 09/13/2015 11:32 AM Date of Discharge: 09/15/2015 Attending Physician: Axel Filler, MD  Discharge Diagnosis: Hepatic Steatosis Right Upper Quadrant Pain  Discharge Medications:   Medication List    TAKE these medications        ACCU-CHEK AVIVA PLUS w/Device Kit  Used as directed     accu-chek softclix lancets  Use as instructed     acetaminophen-codeine 300-30 MG tablet  Commonly known as:  TYLENOL #3  Take 1-2 tablets by mouth at bedtime as needed for moderate pain (or cough).     albuterol (2.5 MG/3ML) 0.083% nebulizer solution  Commonly known as:  PROVENTIL  Take 3 mLs (2.5 mg total) by nebulization every 4 (four) hours as needed for wheezing or shortness of breath (plan C).     alum & mag hydroxide-simeth 200-200-20 MG/5ML suspension  Commonly known as:  MAALOX/MYLANTA  Take 30 mLs by mouth every 6 (six) hours as needed for indigestion or heartburn (dyspepsia).     amoxicillin 250 MG capsule  Commonly known as:  AMOXIL  Take 1 capsule (250 mg total) by mouth 3 (three) times daily.     aspirin 325 MG tablet  Take 325 mg by mouth every morning.     atorvastatin 20 MG tablet  Commonly known as:  LIPITOR  Take 20 mg by mouth daily at 6 PM.     cholecalciferol 1000 units tablet  Commonly known as:  VITAMIN D  Take 1,000 Units by mouth every morning.     clonazePAM 0.5 MG tablet  Commonly known as:  KLONOPIN  Take 1 tablet (0.5 mg total) by mouth 2 (two) times daily as needed (trouble sleeping and anxiety).     cloNIDine 0.2 MG tablet  Commonly known as:  CATAPRES  Take 1 tablet (0.2 mg total) by mouth 3 (three) times daily.     ferrous sulfate 325 (65 FE) MG tablet  Take 1 tablet (325 mg total) by mouth daily with breakfast.     FLUTTER Devi  Use as directed     FOLIC ACID PO  Take 1 tablet by mouth every morning.     furosemide  40 MG tablet  Commonly known as:  LASIX  Take 1 tablet (40 mg total) by mouth every morning.     gabapentin 400 MG capsule  Commonly known as:  NEURONTIN  TAKE 2 CAPSULES BY MOUTH 2 TIMES DAILY.     glucose blood test strip  Commonly known as:  RELION GLUCOSE TEST STRIPS  1 each by Other route 4 (four) times daily. ICD code E11.65     insulin aspart 100 UNIT/ML injection  Commonly known as:  novoLOG  Inject 80 Units into the skin 3 (three) times daily before meals.     Insulin Glargine 100 UNIT/ML Solostar Pen  Commonly known as:  LANTUS SOLOSTAR  Inject 80 Units into the skin at bedtime.     Insulin Pen Needle 31G X 8 MM Misc  Commonly known as:  B-D ULTRAFINE III SHORT PEN  1 application by Does not apply route daily.     ULTICARE MICRO PEN NEEDLES 32G X 4 MM Misc  Generic drug:  Insulin Pen Needle  USE AS DIRECTED     Insulin Syringe-Needle U-100 31G X 15/64" 1 ML Misc  Commonly known as:  BD INSULIN SYRINGE ULTRAFINE  1 each by Does not apply route 3 (  three) times daily.     losartan 100 MG tablet  Commonly known as:  COZAAR  Take 1 tablet (100 mg total) by mouth every morning.     metFORMIN 500 MG tablet  Commonly known as:  GLUCOPHAGE  Take 1 tablet (500 mg total) by mouth 2 (two) times daily with a meal.     methocarbamol 750 MG tablet  Commonly known as:  ROBAXIN  Take 1 tablet (750 mg total) by mouth every 8 (eight) hours as needed for muscle spasms.     mometasone-formoterol 100-5 MCG/ACT Aero  Commonly known as:  DULERA  Take 2 puffs first thing in am and then another 2 puffs about 12 hours later.     montelukast 10 MG tablet  Commonly known as:  SINGULAIR  Take 1 tablet (10 mg total) by mouth at bedtime.     omeprazole 20 MG capsule  Commonly known as:  PRILOSEC  Take 1 capsule (20 mg total) by mouth daily before breakfast.     ondansetron 4 MG tablet  Commonly known as:  ZOFRAN  Take 1 tablet (4 mg total) by mouth every 6 (six) hours as needed  for nausea.     Oyster Shell Calcium 500 MG Tabs  Take 0.4 tablets (500 mg total) by mouth 2 (two) times daily after a meal.     PARoxetine 20 MG tablet  Commonly known as:  PAXIL  Take 1 tablet (20 mg total) by mouth daily. Please schedule appt with Dr. Adrian Blackwater for further refills     predniSONE 1 MG tablet  Commonly known as:  DELTASONE  Take 4 mg by mouth daily with breakfast.     ranitidine 150 MG tablet  Commonly known as:  ZANTAC  Take 2 tablets (300 mg total) by mouth at bedtime.     ReliOn Ultra Thin Lancets Misc  1 each by Does not apply route 4 (four) times daily. ICD code E11.65     verapamil 360 MG 24 hr capsule  Commonly known as:  VERELAN PM  Take 1 capsule (360 mg total) by mouth every morning.        Disposition and follow-up:   [name] was discharged from [facility] in good condition.  At the hospital follow up visit please address:  1.  If the patient has recurrent right upper quadrant pain, she may need a HIDA scan to query gallbladder dysmotility.  2.  Labs / imaging needed at time of follow-up: None  3.  Pending labs/ test needing follow-up: None  Follow-up Appointments:     Follow-up Information    Follow up with Minerva Ends, MD. Go on 09/25/2015.   Specialty:  Family Medicine   Why:  at 12pm for a hospital follow up appointment.   Contact information:   [place] [id number] [phone]       Discharge Instructions: Discharge Instructions    Diet - low sodium heart healthy    Complete by:  As directed      Increase activity slowly    Complete by:  As directed            Consultations: Treatment Team:  Carol Ada, MD  Procedures Performed:  US Abdomen Complete  09/13/2015  CLINICAL DATA:  45 year old female with acute abdominal pain for 1 day. EXAM: ABDOMEN ULTRASOUND COMPLETE COMPARISON:  None. FINDINGS: Gallbladder: The gallbladder is unremarkable. There is no evidence  of cholelithiasis or acute cholecystitis. Common  bile duct: Diameter: 8 mm. No abnormalities of the visualized CBD identified. Liver: Increased echogenicity noted without focal abnormality. IVC: No abnormality visualized. Pancreas: Visualized portion unremarkable. Spleen: Size and appearance within normal limits. Right Kidney: Length: 12.9 cm. Echogenicity within normal limits. No mass or hydronephrosis visualized. Left Kidney: Length: 12.5 cm. Echogenicity within normal limits. No mass or hydronephrosis visualized. Abdominal aorta: No aneurysm visualized. Other findings: None. IMPRESSION: Mild CBD dilatation without identifiable obstructing cause of the visualized CBD. Consider further evaluation with ERCP/ MRCP as indicated. Hepatic steatosis. No other significant abnormalities. Electronically Signed   By: Margarette Canada M.D.   On: 09/13/2015 13:21   Mr Abdomen Mrcp Wo Cm  09/15/2015  CLINICAL DATA:  45 year old female with recent history of abdominal pain. Abdominal pain began at 3 a.m. yesterday evening after large meal, and is described as sharp, constant pain in the right upper quadrant. No prior history of this pain. Decreased appetite. Eleven episodes of nonbloody, non ileus emesis. 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 EXAM: MRI ABDOMEN WITHOUT CONTRAST  (INCLUDING MRCP) TECHNIQUE: Multiplanar multisequence MR imaging of the abdomen was performed. Heavily T2-weighted images of the biliary and pancreatic ducts were obtained, and three-dimensional MRCP images were rendered by post processing. COMPARISON:  Abdominal ultrasound 09/13/2015. No prior abdominal MRI. FINDINGS: Lower chest:  Unremarkable. Hepatobiliary: Diffuse heterogeneous loss of signal intensity throughout the hepatic parenchyma on out of phase dual echo images, compatible with a background of heterogeneous hepatic steatosis. No cystic or solid hepatic  lesions. No intra or extrahepatic biliary ductal dilatation on MRCP images. Common bile duct is normal in caliber measuring 6 mm in the porta hepatis. There are no filling defects within the gallbladder to suggest gallstones. No filling defects in the common bile duct to suggest choledocholithiasis. Pancreas: No pancreatic mass. No pancreatic ductal dilatation. No pancreatic or peripancreatic fluid or inflammatory changes. Spleen: 1.9 cm well-defined T1 hypointense, T2 hyperintense lesion in the upper pole of the spleen is incompletely characterized on today's noncontrast examination, but likely a benign lesion such as a cyst or hemangioma. Adrenals/Urinary Tract: Bilateral adrenal glands and bilateral kidneys are normal in appearance on today's noncontrast examination. No hydroureteronephrosis in the visualized abdomen. Stomach/Bowel: Visualized portions are unremarkable. Vascular/Lymphatic: No aneurysm identified in the visualized abdominal vasculature. No lymphadenopathy noted in the visualized abdomen. Other: No significant volume of ascites in the visualized peritoneal cavity. Musculoskeletal: No aggressive appearing osseous lesions noted visualized portions of the skeleton. IMPRESSION: 1. No evidence of gallstones. No findings to suggest acute cholecystitis. No choledocholithiasis. No findings to suggest biliary tract obstruction. 2. Severe heterogeneous hepatic steatosis. Electronically Signed   By: Vinnie Langton M.D.   On: 09/15/2015 08:04   Mr 3d Recon At Scanner  09/15/2015  CLINICAL DATA:  45 year old female with recent history of abdominal pain. Abdominal pain began at 3 a.m. yesterday evening after large meal, and is described as sharp, constant pain in the right upper quadrant. No prior history of this pain. Decreased appetite. Eleven episodes of nonbloody, non ileus emesis. Past middle history of iatrogenic Cushing syndrome, asthma, and steroid-induced osteoporosis complicated by compression  fractures. EXAM: MRI ABDOMEN WITHOUT CONTRAST  (INCLUDING MRCP) TECHNIQUE: Multiplanar multisequence MR imaging of the abdomen was performed. Heavily T2-weighted images of the biliary and pancreatic ducts were obtained, and three-dimensional MRCP images were rendered by post processing. COMPARISON:  Abdominal ultrasound 09/13/2015. No prior abdominal MRI. FINDINGS: Lower chest:  Unremarkable. Hepatobiliary: Diffuse  heterogeneous loss of signal intensity throughout the hepatic parenchyma on out of phase dual echo images, compatible with a background of heterogeneous hepatic steatosis. No cystic or solid hepatic lesions. No intra or extrahepatic biliary ductal dilatation on MRCP images. Common bile duct is normal in caliber measuring 6 mm in the porta hepatis. There are no filling defects within the gallbladder to suggest gallstones. No filling defects in the common bile duct to suggest choledocholithiasis. Pancreas: No pancreatic mass. No pancreatic ductal dilatation. No pancreatic or peripancreatic fluid or inflammatory changes. Spleen: 1.9 cm well-defined T1 hypointense, T2 hyperintense lesion in the upper pole of the spleen is incompletely characterized on today's noncontrast examination, but likely a benign lesion such as a cyst or hemangioma. Adrenals/Urinary Tract: Bilateral adrenal glands and bilateral kidneys are normal in appearance on today's noncontrast examination. No hydroureteronephrosis in the visualized abdomen. Stomach/Bowel: Visualized portions are unremarkable. Vascular/Lymphatic: No aneurysm identified in the visualized abdominal vasculature. No lymphadenopathy noted in the visualized abdomen. Other: No significant volume of ascites in the visualized peritoneal cavity. Musculoskeletal: No aggressive appearing osseous lesions noted visualized portions of the skeleton. IMPRESSION: 1. No evidence of gallstones. No findings to suggest acute cholecystitis. No choledocholithiasis. No findings to suggest  biliary tract obstruction. 2. Severe heterogeneous hepatic steatosis. Electronically Signed   By: Vinnie Langton M.D.   On: 09/15/2015 08:04   Dg Abd Acute W/chest  09/13/2015  CLINICAL DATA:  Patient with right-sided abdominal pain and nausea. EXAM: DG ABDOMEN ACUTE W/ 1V CHEST COMPARISON:  Chest radiograph 09/16/2014. FINDINGS: Stable cardiac and mediastinal contours. No consolidative pulmonary opacities. No pleural effusion or pneumothorax. Gas is demonstrated within nondilated loops of large and small bowel in a nonobstructed pattern. No free intraperitoneal air. Apparent chronic healing fracture of the left inferior pubic ramus. SI joints are unremarkable. Lumbar spine degenerative changes. IMPRESSION: Nonobstructed bowel gas pattern. No acute cardiopulmonary process. Electronically Signed   By: Lovey Newcomer M.D.   On: 09/13/2015 16:10    Admission HPI: Ms Raven Turner is a 45 year old woman with a PMH of obesity, iatrogenic Cushing syndrome, steroid-induced osteoporosis with compression fractures, asthma (PFTs show no airway obstruction 10/2014), dCHF (EF 50-55, Grade 2) who presents with abdominal pain. Patient's first language is Spanish, and history was obtained with the assistance of a phone interpretor. The pain started at 3AM last night after a meal of pork and rice, describing it as a sharp, constant pain in her right upper quadrant. She cannot identify any aggravating or alleviating factors, other than the opioids she received in the ED. She reports never having this pain before. She does endorse a longstanding history of pain after meals, but she attributes this to heartburn. Her pain was associated with decreased appetite and eleven episodes on non-bloody, non-bilious emesis. She denies any diarrhea, with her last bowel movement yesterday. Her family history is pertinent for her sister having a cholecystectomy and her uncle having hepatitis. She has longstanding pain the the "bones of [her] legs"  from fractures she's had and some wheezing. She says her weight is "up and down." She denies any fever, chest pain, light-headedness, shortness of breath, dysuria, new one-sided weakness, blackouts, headaches, skin changes, or vision changes. She is a non-smoker, non-drinker, and does not use illicit drugs. She moved here from [place] last year. She is currently being treated for strep A positive pharyngitis with amoxicillin. In the ED, she was afebrile and vital signs were stable. CBC was remarkable for mild leukocytosis (11.1)  and thrombocytosis (453). Alkaline phosphatase was elevated to 127, normal AST and ALT. RUQ ultrasound remarkable for mild CBD dilatation without identifiable obstruction, recommending ERCP. Lipase normal. UA showed trace leukocytes. CXR normal. [facility] was consulted.  Hospital Course by problem list:   Right Upper Quadrant Abdominal Pain: Patient was noted to have a chronically elevated alkaline phosphatase from 100-200. She had an elevated GGT as well.  MRCP only remarkable for severe hepatic steatosis, no evidence of gallbladder disease, likely related to her obesity and significant insulin resistance. Her ain was managed with Hydromorphone 0.25 mg q4h prn pain which was discontinued the morning of discharge. Her pain resolved and she remained pain free, with no abdominal tenderness on exam by day of discharge. She was switched to a regular diet of day of discharge, which she tolerated well. [facility], Dr. Jeani Hawking, recommended HIDA scan if her symptoms returned.   Hypoglycemia: Patient had an asymptomatic hypoglycemia to the 60s while she was NPO. She remains asymptomatic. She did not received any insulin yet during hospitalization. This was managed with D50 73ml for BGs <70. After starting a regular diet, her BGs improved to the 130s-160s.   History of Asthma: Patient was noted to have wheezing on exam on admission that had resolved by day of  discharge. I reviewed PFTs in 10/2014 show no obstructive airway disease, although this could may been confounded by her chronic prednisone therapy. She follows with Dr. Sherene Sires as an outpatient. It was managed with, Albuterol nebulizer prn, Dulera 2 puffs BID, Singulair 10 mg daily, and her home dose of Prednisone 4 mg daily.  Type 2 Diabetes with Neuropathy: Patient was noted to be on an aggressive insulin regimen at home Novolog 80U TID with meals and 80U Lantus at bedtime. We confirmed this dose on two separate occasions during the admission. Her insulin was held given her NPO status and hypoglycemia.   HTN: Stable during this admission. She was continued on her home regimen:  Clonidine 0.2 mg TID, Losartan 100 mg daily, Verapamil 360 mg 24 hr capsule daily  Strep A Pharyngitis: No tonsillar exudate or erythema appreciated on exam on admission, but previously established course was continued: Amoxicillin 500 mg BID for total 10 day course (end 09/18/2015). No fevers during this admission.  Discharge Vitals:   BP 122/78 mmHg  Pulse 100  Temp(Src) 98.5 F (36.9 C) (Oral)  Resp 18  Ht 4\' 10"  (1.473 m)  Wt 181 lb 6 oz (82.271 kg)  BMI 37.92 kg/m2  SpO2 95%  LMP 09/06/2015  Discharge Labs:  Results for orders placed or performed during the hospital encounter of 09/13/15 (from the past 24 hour(s))  Glucose, capillary     Status: Abnormal   Collection Time: 09/14/15  4:10 PM  Result Value Ref Range   Glucose-Capillary 156 (H) 65 - 99 mg/dL  Glucose, capillary     Status: Abnormal   Collection Time: 09/14/15  8:31 PM  Result Value Ref Range   Glucose-Capillary 100 (H) 65 - 99 mg/dL  Glucose, capillary     Status: None   Collection Time: 09/14/15 11:57 PM  Result Value Ref Range   Glucose-Capillary 93 65 - 99 mg/dL  Glucose, capillary     Status: None   Collection Time: 09/15/15  3:47 AM  Result Value Ref Range   Glucose-Capillary 92 65 - 99 mg/dL  CBC     Status: None   Collection Time:  09/15/15  5:40 AM  Result Value Ref  Range   WBC 7.8 4.0 - 10.5 K/uL   RBC 4.45 3.87 - 5.11 MIL/uL   Hemoglobin 12.0 12.0 - 15.0 g/dL   HCT 36.7 36.0 - 46.0 %   MCV 82.5 78.0 - 100.0 fL   MCH 27.0 26.0 - 34.0 pg   MCHC 32.7 30.0 - 36.0 g/dL   RDW 15.1 11.5 - 15.5 %   Platelets 397 150 - 400 K/uL  Basic metabolic panel     Status: Abnormal   Collection Time: 09/15/15  5:40 AM  Result Value Ref Range   Sodium 139 135 - 145 mmol/L   Potassium 3.6 3.5 - 5.1 mmol/L   Chloride 102 101 - 111 mmol/L   CO2 27 22 - 32 mmol/L   Glucose, Bld 107 (H) 65 - 99 mg/dL   BUN <5 (L) 6 - 20 mg/dL   Creatinine, Ser 0.78 0.44 - 1.00 mg/dL   Calcium 8.5 (L) 8.9 - 10.3 mg/dL   GFR calc non Af Amer >60 >60 mL/min   GFR calc Af Amer >60 >60 mL/min   Anion gap 10 5 - 15  Glucose, capillary     Status: Abnormal   Collection Time: 09/15/15  7:31 AM  Result Value Ref Range   Glucose-Capillary 107 (H) 65 - 99 mg/dL  Glucose, capillary     Status: Abnormal   Collection Time: 09/15/15 11:37 AM  Result Value Ref Range   Glucose-Capillary 161 (H) 65 - 99 mg/dL    Signed: Liberty Handy, MD 09/15/2015, 3:01 PM

## 2015-09-15 NOTE — Progress Notes (Signed)
Discharge teaching completed. Discharged home accompanied by spouse.

## 2015-09-16 NOTE — Telephone Encounter (Signed)
Called to check on Pt Left voice message to return call

## 2015-09-21 ENCOUNTER — Ambulatory Visit: Payer: Medicaid Other | Admitting: Occupational Therapy

## 2015-09-22 ENCOUNTER — Ambulatory Visit (INDEPENDENT_AMBULATORY_CARE_PROVIDER_SITE_OTHER): Payer: Medicaid Other | Admitting: Neurology

## 2015-09-22 ENCOUNTER — Other Ambulatory Visit: Payer: Self-pay | Admitting: Family Medicine

## 2015-09-22 DIAGNOSIS — R202 Paresthesia of skin: Secondary | ICD-10-CM | POA: Diagnosis not present

## 2015-09-22 DIAGNOSIS — G5603 Carpal tunnel syndrome, bilateral upper limbs: Secondary | ICD-10-CM

## 2015-09-22 DIAGNOSIS — S22080A Wedge compression fracture of T11-T12 vertebra, initial encounter for closed fracture: Secondary | ICD-10-CM

## 2015-09-22 DIAGNOSIS — S32010S Wedge compression fracture of first lumbar vertebra, sequela: Secondary | ICD-10-CM

## 2015-09-22 DIAGNOSIS — M5412 Radiculopathy, cervical region: Secondary | ICD-10-CM

## 2015-09-22 MED FILL — ATORVASTATIN 20 MG TABLET: 20 | 30 days supply | Qty: 30 | Fill #5

## 2015-09-22 MED FILL — MONTELUKAST SOD 10 MG TAB: 10 | 30 days supply | Qty: 30 | Fill #6

## 2015-09-22 MED FILL — cloNIDine HCL 0.2 MG TABS: 0.2 | 30 days supply | Qty: 90 | Fill #8

## 2015-09-22 NOTE — Procedures (Signed)
[facility]  [address]  [place], [place] [address] Tel: [phone] Fax:  [phone] Test Date:  09/22/2015  Patient: Raven Turner DOB: Mar 30, 1971 Physician: Narda Amber, DO  Sex: Female Height: 4\' 10"  Ref Phys: Narda Amber, DO  ID#: [id number] Temp: 32.3 Technician: M. Dean   Patient Complaints: This is a 45 year old diabetic female presenting for evaluation of bilateral hand paresthesias, worse on the right.  NCV & EMG Findings: Extensive electrodiagnostic testing of the right upper extremities and additional studies of the left shows: 1. Bilateral median sensory responses are prolonged (R4.2, L4.0 ms) with preserved amplitude. Bilateral ulnar sensory responses are within normal limits. 2. Bilateral median motor responses show prolonged latency (R4.1, L4.6 ms).  Bilateral ulnar motor responses are within normal limits. 3. Chronic motor axon loss changes are seen affecting the right abductor pollicis brevis, pronator teres, biceps, and deltoid muscles. There is no evidence of accompanied active denervation. Similar findings are not present in the left upper extremity.  Impression: 1. Bilateral median neuropathy at or distal to the wrist, consistent with clinical diagnosis of carpal tunnel syndrome. Overall, these findings are moderate in degree electrically. 2. Chronic C6 radiculopathy affecting the right upper extremity, moderate in degree electrically.   ___________________________ Narda Amber, DO    Nerve Conduction Studies Anti Sensory Summary Table   Site NR Peak (ms) Norm Peak (ms) P-T Amp (V) Norm P-T Amp  Left Median Anti Sensory (2nd Digit)  Wrist    4.0 <3.4 40.0 >20  Right Median Anti Sensory (2nd Digit)  32.3C  Wrist    4.2 <3.4 26.4 >20  Left Ulnar Anti Sensory (5th Digit)  Wrist    2.9 <3.1 37.8 >12  Right Ulnar Anti Sensory (5th Digit)  32.3C  Wrist    2.9 <3.1 27.1 >12   Motor Summary Table   Site NR Onset (ms)  Norm Onset (ms) O-P Amp (mV) Norm O-P Amp Site1 Site2 Delta-0 (ms) Dist (cm) Vel (m/s) Norm Vel (m/s)  Left Median Motor (Abd Poll Brev)  32.3C  Wrist    4.6 <3.9 7.9 >6 Elbow Wrist 4.4 23.0 52 >50  Elbow    9.0  7.4         Right Median Motor (Abd Poll Brev)  32.3C  Wrist    4.1 <3.9 9.4 >6 Elbow Wrist 4.2 21.0 50 >50  Elbow    8.3  9.4         Left Ulnar Motor (Abd Dig Minimi)  Wrist    2.8 <3.1 8.1 >7 B Elbow Wrist 3.3 17.0 52 >50  B Elbow    6.1  8.0  A Elbow B Elbow 1.6 10.0 62 >50  A Elbow    7.7  7.9         Right Ulnar Motor (Abd Dig Minimi)  32.3C  Wrist    2.9 <3.1 12.2 >7 B Elbow Wrist 3.2 18.0 56 >50  B Elbow    6.1  11.5  A Elbow B Elbow 1.6 10.0 62 >50  A Elbow    7.7  11.5          EMG   Side Muscle Ins Act Fibs Psw Fasc Number Recrt Dur Dur. Amp Amp. Poly Poly. Comment  Right 1stDorInt Nml Nml Nml Nml Nml Nml Nml Nml Nml Nml Nml Nml N/A  Right Abd Poll Brev Nml Nml Nml Nml 1- Rapid Few 1+ Few 1+ Nml Nml N/A  Right Ext Indicis Nml Nml  Nml Nml Nml Nml Nml Nml Nml Nml Nml Nml N/A  Right PronatorTeres CRD Nml Nml Nml 1- Mod-R Some 1+ Some 1+ Nml Nml N/A  Right Biceps Nml Nml Nml Nml 1- Rapid Some 1+ Some 1+ Nml Nml N/A  Right Triceps Nml Nml Nml Nml Nml Nml Nml Nml Nml Nml Nml Nml N/A  Right Deltoid Nml Nml Nml Nml 1- Mod-R Few 1+ Few 1+ Nml Nml N/A  Right Cervical Parasp Low Nml Nml Nml Nml Nml Nml Nml Nml Nml Nml Nml Nml N/A  Left 1stDorInt Nml Nml Nml Nml Nml Nml Nml Nml Nml Nml Nml Nml N/A  Left Abd Poll Brev Nml Nml Nml Nml Nml Nml Nml Nml Nml Nml Nml Nml N/A  Left Ext Indicis Nml Nml Nml Nml Nml Nml Nml Nml Nml Nml Nml Nml N/A  Left PronatorTeres Nml Nml Nml Nml Nml Nml Nml Nml Nml Nml Nml Nml N/A  Left Biceps Nml Nml Nml Nml Nml Nml Nml Nml Nml Nml Nml Nml N/A  Left Triceps Nml Nml Nml Nml Nml Nml Nml Nml Nml Nml Nml Nml N/A  Left Deltoid Nml Nml Nml Nml Nml Nml Nml Nml Nml Nml Nml Nml N/A      Waveforms:

## 2015-09-24 NOTE — Telephone Encounter (Signed)
Tylenol #3 ready for pick up 2 refills placed

## 2015-09-25 ENCOUNTER — Ambulatory Visit (HOSPITAL_BASED_OUTPATIENT_CLINIC_OR_DEPARTMENT_OTHER): Payer: Medicaid Other | Admitting: Family Medicine

## 2015-09-25 ENCOUNTER — Encounter: Payer: Self-pay | Admitting: Family Medicine

## 2015-09-25 ENCOUNTER — Telehealth: Payer: Self-pay | Admitting: *Deleted

## 2015-09-25 ENCOUNTER — Ambulatory Visit (HOSPITAL_COMMUNITY)
Admission: RE | Admit: 2015-09-25 | Discharge: 2015-09-25 | Disposition: A | Discharge status: Home / Self Care | Payer: Medicaid Other | Source: Ambulatory Visit | Attending: Family Medicine | Admitting: Family Medicine

## 2015-09-25 VITALS — BP 121/73 | HR 94 | Temp 98.2°F | Resp 16 | Ht <= 58 in | Wt 181.0 lb

## 2015-09-25 DIAGNOSIS — S32010S Wedge compression fracture of first lumbar vertebra, sequela: Secondary | ICD-10-CM

## 2015-09-25 DIAGNOSIS — Z794 Long term (current) use of insulin: Secondary | ICD-10-CM | POA: Diagnosis not present

## 2015-09-25 DIAGNOSIS — R0989 Other specified symptoms and signs involving the circulatory and respiratory systems: Secondary | ICD-10-CM | POA: Insufficient documentation

## 2015-09-25 DIAGNOSIS — S22080A Wedge compression fracture of T11-T12 vertebra, initial encounter for closed fracture: Secondary | ICD-10-CM

## 2015-09-25 DIAGNOSIS — R1011 Right upper quadrant pain: Secondary | ICD-10-CM

## 2015-09-25 DIAGNOSIS — F411 Generalized anxiety disorder: Secondary | ICD-10-CM

## 2015-09-25 DIAGNOSIS — K7581 Nonalcoholic steatohepatitis (NASH): Secondary | ICD-10-CM

## 2015-09-25 DIAGNOSIS — J449 Chronic obstructive pulmonary disease, unspecified: Secondary | ICD-10-CM | POA: Insufficient documentation

## 2015-09-25 DIAGNOSIS — R05 Cough: Secondary | ICD-10-CM | POA: Diagnosis not present

## 2015-09-25 DIAGNOSIS — E1165 Type 2 diabetes mellitus with hyperglycemia: Secondary | ICD-10-CM | POA: Diagnosis not present

## 2015-09-25 DIAGNOSIS — M4854XA Collapsed vertebra, not elsewhere classified, thoracic region, initial encounter for fracture: Secondary | ICD-10-CM

## 2015-09-25 LAB — GLUCOSE, POCT (MANUAL RESULT ENTRY): POC Glucose: 200 mg/dl — AB (ref 70–99)

## 2015-09-25 LAB — POCT GLYCOSYLATED HEMOGLOBIN (HGB A1C): Hemoglobin A1C: 7.3

## 2015-09-25 MED ORDER — AZITHROMYCIN 250 MG PO TABS: ORAL_TABLET | ORAL | Status: DC

## 2015-09-25 MED ORDER — INSULIN GLARGINE 100 UNIT/ML SOLOSTAR PEN: 96.0000 [IU] | PEN_INJECTOR | Freq: Every day | SUBCUTANEOUS | Status: DC

## 2015-09-25 MED ORDER — PAROXETINE HCL 20 MG PO TABS: 20.0000 mg | ORAL_TABLET | Freq: Every day | ORAL | Status: DC

## 2015-09-25 MED FILL — PARoxetine HCL 20 MG TABS: 20 | 30 days supply | Qty: 30 | Fill #0

## 2015-09-25 NOTE — Telephone Encounter (Signed)
Rx at front office ready to be pick up

## 2015-09-25 NOTE — Assessment & Plan Note (Signed)
A: congestion, r/o HCAP, possible bronchitis P: Azithromycin CXR Continue albuterol prn  Continue mucinex DM

## 2015-09-25 NOTE — Patient Instructions (Addendum)
Shanaiya was seen today for hospitalization follow-up and abdominal pain.  Diagnoses and all orders for this visit:  Uncontrolled type 2 diabetes mellitus with hyperglycemia, with long-term current use of insulin (HCC) -     POCT glycosylated hemoglobin (Hb A1C) -     POCT glucose (manual entry) -     Insulin Glargine (LANTUS SOLOSTAR) 100 UNIT/ML Solostar Pen; Inject 96 Units into the skin at bedtime.  NASH (nonalcoholic steatohepatitis)  Chest congestion -     DG Chest 2 View; Future -     azithromycin (ZITHROMAX) 250 MG tablet; 500 mg today, then 250 mg daily for 4 days  Compression fracture of L1 lumbar vertebra, sequela  Compression fracture of T12 vertebra, initial encounter (HCC)  Generalized anxiety disorder -     PARoxetine (PAXIL) 20 MG tablet; Take 1 tablet (20 mg total) by mouth daily.   F/u in 6 weeks low back pain   Dr. Adrian Blackwater

## 2015-09-25 NOTE — Progress Notes (Signed)
Subjective:  Patient ID: Raven Turner, female    DOB: 09/02/1971  Age: 45 y.o. MRN: [id number]  CC: Hospitalization Follow-up and Abdominal Pain   HPI Lasandra Batley presents for   Spanish interpreter used   1. hospitalization follow up: RUQ pain with vomiting. Hospitalized from 09/13/15-09/15/15. Normal MRCP except hepatic steatosis. No longer have emesis. Still with slight pain in RUQ and L mid abdomen. Still with nausea.   2. Congestion: started in hospital. She took amox for pharyngitis. She is taking mucinex DM and albuterol with temporary relief. No fever, chills or CP. Intermittent cough.    Social History  Substance Use Topics  . Smoking status: Never Smoker   . Smokeless tobacco: Never Used  . Alcohol Use: No    Outpatient Prescriptions Prior to Visit  Medication Sig Dispense Refill  . acetaminophen-codeine (TYLENOL #3) 300-30 MG tablet TAKE 1 TO 2 TABLETS BY MOUTH AT BEDTIME AS NEEDED FOR MODERATE PAIN 60 tablet 2  . albuterol (PROVENTIL) (2.5 MG/3ML) 0.083% nebulizer solution Take 3 mLs (2.5 mg total) by nebulization every 4 (four) hours as needed for wheezing or shortness of breath (plan C). 75 mL 3  . alum & mag hydroxide-simeth (MAALOX/MYLANTA) 200-200-20 MG/5ML suspension Take 30 mLs by mouth every 6 (six) hours as needed for indigestion or heartburn (dyspepsia). 355 mL 0  . amoxicillin (AMOXIL) 250 MG capsule Take 1 capsule (250 mg total) by mouth 3 (three) times daily. 28 capsule 0  . aspirin 325 MG tablet Take 325 mg by mouth every morning.    Marland Kitchen atorvastatin (LIPITOR) 20 MG tablet Take 20 mg by mouth daily at 6 PM.    . Blood Glucose Monitoring Suppl (ACCU-CHEK AVIVA PLUS) W/DEVICE KIT Used as directed 1 kit 0  . cholecalciferol (VITAMIN D) 1000 UNITS tablet Take 1,000 Units by mouth every morning.    . clonazePAM (KLONOPIN) 0.5 MG tablet Take 1 tablet (0.5 mg total) by mouth 2 (two) times daily as needed (trouble sleeping and anxiety). 30 tablet 2  .  cloNIDine (CATAPRES) 0.2 MG tablet Take 1 tablet (0.2 mg total) by mouth 3 (three) times daily. 90 tablet 11  . ferrous sulfate 325 (65 FE) MG tablet Take 1 tablet (325 mg total) by mouth daily with breakfast. 30 tablet 3  . FOLIC ACID PO Take 1 tablet by mouth every morning.    . furosemide (LASIX) 40 MG tablet Take 1 tablet (40 mg total) by mouth every morning. 30 tablet 2  . gabapentin (NEURONTIN) 400 MG capsule TAKE 2 CAPSULES BY MOUTH 2 TIMES DAILY. 120 capsule 0  . glucose blood (RELION GLUCOSE TEST STRIPS) test strip 1 each by Other route 4 (four) times daily. ICD code E11.65 400 each 3  . insulin aspart (NOVOLOG) 100 UNIT/ML injection Inject 80 Units into the skin 3 (three) times daily before meals. 3 vial 3  . Insulin Glargine (LANTUS SOLOSTAR) 100 UNIT/ML Solostar Pen Inject 80 Units into the skin at bedtime. 5 pen 11  . Insulin Pen Needle (B-D ULTRAFINE III SHORT PEN) 31G X 8 MM MISC 1 application by Does not apply route daily. 100 each 3  . Insulin Syringe-Needle U-100 (BD INSULIN SYRINGE ULTRAFINE) 31G X 15/64" 1 ML MISC 1 each by Does not apply route 3 (three) times daily. 300 each 3  . Lancet Devices (ACCU-CHEK SOFTCLIX) lancets Use as instructed 1 each 0  . losartan (COZAAR) 100 MG tablet Take 1 tablet (100 mg total) by mouth every morning. [place]  tablet 3  . metFORMIN (GLUCOPHAGE) 500 MG tablet Take 1 tablet (500 mg total) by mouth 2 (two) times daily with a meal. 60 tablet 11  . methocarbamol (ROBAXIN) 750 MG tablet Take 1 tablet (750 mg total) by mouth every 8 (eight) hours as needed for muscle spasms. 90 tablet 2  . mometasone-formoterol (DULERA) 100-5 MCG/ACT AERO Take 2 puffs first thing in am and then another 2 puffs about 12 hours later. 1 Inhaler 11  . montelukast (SINGULAIR) 10 MG tablet Take 1 tablet (10 mg total) by mouth at bedtime. 30 tablet 11  . omeprazole (PRILOSEC) 20 MG capsule Take 1 capsule (20 mg total) by mouth daily before breakfast. 90 capsule 1  . ondansetron  (ZOFRAN) 4 MG tablet Take 1 tablet (4 mg total) by mouth every 6 (six) hours as needed for nausea. 20 tablet 0  . Oyster Shell Calcium 500 MG TABS Take 0.4 tablets (500 mg total) by mouth 2 (two) times daily after a meal. 60 tablet 11  . PARoxetine (PAXIL) 20 MG tablet Take 1 tablet (20 mg total) by mouth daily. Please schedule appt with Dr. Adrian Blackwater for further refills 30 tablet 1  . predniSONE (DELTASONE) 1 MG tablet Take 4 mg by mouth daily with breakfast.     . ranitidine (ZANTAC) 150 MG tablet Take 2 tablets (300 mg total) by mouth at bedtime. 60 tablet 5  . ReliOn Ultra Thin Lancets MISC 1 each by Does not apply route 4 (four) times daily. ICD code E11.65 400 each 3  . Respiratory Therapy Supplies (FLUTTER) DEVI Use as directed 1 each 0  . ULTICARE MICRO PEN NEEDLES 32G X 4 MM MISC USE AS DIRECTED 100 each 2  . verapamil (VERELAN PM) 360 MG 24 hr capsule Take 1 capsule (360 mg total) by mouth every morning. 30 capsule 11   Facility-Administered Medications Prior to Visit  Medication Dose Route Frequency Provider Last Rate Last Dose  . DOBUTamine (DOBUTREX) 1,000 mcg/mL in dextrose 5% 250 mL infusion  30 mcg/kg/min Intravenous Continuous Mihai Croitoru, MD 145.3 mL/hr at 04/22/15 1300 30 mcg/kg/min at 04/22/15 1300    ROS Review of Systems  Constitutional: Negative for fever and chills.  HENT: Positive for congestion.   Eyes: Negative for visual disturbance.  Respiratory: Positive for cough. Negative for shortness of breath.   Cardiovascular: Negative for chest pain.  Gastrointestinal: Positive for nausea and abdominal pain. Negative for blood in stool.  Musculoskeletal: Positive for back pain and arthralgias.  Skin: Negative for rash.  Allergic/Immunologic: Negative for immunocompromised state.  Hematological: Negative for adenopathy. Does not bruise/bleed easily.  Psychiatric/Behavioral: Negative for suicidal ideas and dysphoric mood.    Objective:  BP 121/73 mmHg  Pulse 94   Temp(Src) 98.2 F (36.8 C) (Oral)  Resp 16  Ht '4\' 10"'$  (1.473 m)  Wt 181 lb (82.101 kg)  BMI 37.84 kg/m2  SpO2 99%  LMP 09/06/2015  BP/Weight 09/25/2015 09/18/6158 03/14/7105  Systolic BP 269 485 -  Diastolic BP 73 78 -  Wt. (Lbs) 181 - 181.38  BMI 37.84 - 37.92   Physical Exam  Constitutional: She is oriented to person, place, and time. She appears well-developed and well-nourished. No distress.  HENT:  Head: Normocephalic and atraumatic.  Cardiovascular: Normal rate, regular rhythm, normal heart sounds and intact distal pulses.   Pulmonary/Chest: Effort normal. No respiratory distress. She has no wheezes. She has rales. She exhibits no tenderness.  Abdominal: Soft. Bowel sounds are normal. She exhibits no distension  and no mass. There is no tenderness (tenderness in LUQ exacerbated by abdominal muscle flexion ). There is no rebound and no guarding.  Musculoskeletal: She exhibits no edema.  Neurological: She is alert and oriented to person, place, and time.  Skin: Skin is warm and dry. No rash noted.  Psychiatric: She has a normal mood and affect.    Lab Results  Component Value Date   HGBA1C 7.30 09/25/2015   CBG 200 Assessment & Plan:   Problem List Items Addressed This Visit    Abdominal pain    A: improved with nausea P: Monitor symptoms Back to GI if pain worsens for HIDA scan       Chest congestion    A: congestion, r/o HCAP, possible bronchitis P: Azithromycin CXR Continue albuterol prn  Continue mucinex DM      Relevant Medications   azithromycin (ZITHROMAX) 250 MG tablet   Other Relevant Orders   DG Chest 2 View   Compression fracture of L1 lumbar vertebra (HCC)   Compression fracture of T12 vertebra (HCC)   Diabetes mellitus type 2, uncontrolled (HCC) - Primary (Chronic)    Improving with downtrending A1c Followed by endo Continue endo       Relevant Medications   Insulin Glargine (LANTUS SOLOSTAR) 100 UNIT/ML Solostar Pen   Other Relevant Orders    POCT glycosylated hemoglobin (Hb A1C) (Completed)   POCT glucose (manual entry) (Completed)   Generalized anxiety disorder (Chronic)   Relevant Medications   PARoxetine (PAXIL) 20 MG tablet   NASH (nonalcoholic steatohepatitis) (Chronic)    NASH in setting of DM2  DM2 improving Reassurance Limit daily tylenol to 3000 mg Weight loss Complete viral hep vaccination           No orders of the defined types were placed in this encounter.    Follow-up: No Follow-up on file.   Boykin Nearing MD

## 2015-09-25 NOTE — Telephone Encounter (Signed)
Date of birth verified by pt  Xray results given Pt verbalized understanding  Information given in Spanish

## 2015-09-25 NOTE — Assessment & Plan Note (Signed)
NASH in setting of DM2  DM2 improving Reassurance Limit daily tylenol to 3000 mg Weight loss Complete viral hep vaccination

## 2015-09-25 NOTE — Telephone Encounter (Signed)
-----   Message from Boykin Nearing, MD sent at 09/25/2015  5:25 PM EST ----- Chronic bronchitis on CXR No pneumonia  Complete 5 days of azithromycin Continue inhalers Keep f/u with pulmonology

## 2015-09-25 NOTE — Assessment & Plan Note (Signed)
Improving with downtrending A1c Followed by endo Continue endo

## 2015-09-25 NOTE — Assessment & Plan Note (Signed)
A: improved with nausea P: Monitor symptoms Back to GI if pain worsens for HIDA scan

## 2015-09-25 NOTE — Progress Notes (Signed)
HFU abdominal pain and vomiting  No pain today  No tobacco user  No suicidal thought in the past two weeks

## 2015-09-29 MED FILL — LOSARTAN POTASSIUM 100 MG T: 100 | 30 days supply | Qty: 30 | Fill #1

## 2015-10-08 ENCOUNTER — Encounter: Payer: Self-pay | Admitting: Neurology

## 2015-10-08 ENCOUNTER — Ambulatory Visit (INDEPENDENT_AMBULATORY_CARE_PROVIDER_SITE_OTHER): Payer: Medicaid Other | Admitting: Neurology

## 2015-10-08 VITALS — BP 130/80 | HR 85 | Ht <= 58 in | Wt 182.3 lb

## 2015-10-08 DIAGNOSIS — M6289 Other specified disorders of muscle: Secondary | ICD-10-CM

## 2015-10-08 DIAGNOSIS — G959 Disease of spinal cord, unspecified: Secondary | ICD-10-CM

## 2015-10-08 DIAGNOSIS — R292 Abnormal reflex: Secondary | ICD-10-CM

## 2015-10-08 DIAGNOSIS — G5603 Carpal tunnel syndrome, bilateral upper limbs: Secondary | ICD-10-CM | POA: Diagnosis not present

## 2015-10-08 DIAGNOSIS — R29898 Other symptoms and signs involving the musculoskeletal system: Secondary | ICD-10-CM

## 2015-10-08 NOTE — Progress Notes (Signed)
Follow-up Visit   Date: 10/08/2015    Raven Turner MRN: [id number] DOB: 1970/09/25   Interim History: Raven Turner is a 45 y.o. right-handed Hispanic female with insulin-dependent diabetes mellitus, hypertension, CODP, steroid-induced osteoporosis, cushing syndrome, depression, asthma returning to the clinic for follow-up of bilateral hand paresthesias.  The patient was accompanied to the clinic by Spanish interpreter who also provides collateral information.    History of present illness: Starting around September she noticed intermittent numbness/tingling of the hands, worse on the right. It is improved if she is not using her hands and worse with activity.  She has difficulty holding objects and with fine motor movement and attributes this to hand weakness.  Denies neck pain.     She suffered a fall in [place] in 2014 and had compression fracture of the spine for which she wear a thoracic brace and is followed by Dr. Lynann Bologna. Since the fall, she has been walking with a cane.    She also reports being diagnosed with neuropathy some years ago and takes gabapentin 851m BID for this.  UPDATE 10/07/2014:  Patient is here to discuss results of NCS/EMG which showed bilateral CTS and right C6 radiculopathy.  She continues to have paresthesias and weakness of the hand, worse on the right.  No neck pain.  She is getting relief with gabapenitn 8059mBID.  She has tried using wrist splints, but the left has a thumb support that is too uncomfortable.    Medications:  Current Outpatient Prescriptions on File Prior to Visit  Medication Sig Dispense Refill  . acetaminophen-codeine (TYLENOL #3) 300-30 MG tablet TAKE 1 TO 2 TABLETS BY MOUTH AT BEDTIME AS NEEDED FOR MODERATE PAIN 60 tablet 2  . albuterol (PROVENTIL) (2.5 MG/3ML) 0.083% nebulizer solution Take 3 mLs (2.5 mg total) by nebulization every 4 (four) hours as needed for wheezing or shortness of breath (plan C). 75 mL 3   . alum & mag hydroxide-simeth (MAALOX/MYLANTA) 200-200-20 MG/5ML suspension Take 30 mLs by mouth every 6 (six) hours as needed for indigestion or heartburn (dyspepsia). 355 mL 0  . aspirin 325 MG tablet Take 325 mg by mouth every morning.    . Marland Kitchentorvastatin (LIPITOR) 20 MG tablet Take 20 mg by mouth daily at 6 PM.    . Blood Glucose Monitoring Suppl (ACCU-CHEK AVIVA PLUS) W/DEVICE KIT Used as directed 1 kit 0  . cholecalciferol (VITAMIN D) 1000 UNITS tablet Take 1,000 Units by mouth every morning.    . clonazePAM (KLONOPIN) 0.5 MG tablet Take 1 tablet (0.5 mg total) by mouth 2 (two) times daily as needed (trouble sleeping and anxiety). 30 tablet 2  . cloNIDine (CATAPRES) 0.2 MG tablet Take 1 tablet (0.2 mg total) by mouth 3 (three) times daily. 90 tablet 11  . ferrous sulfate 325 (65 FE) MG tablet Take 1 tablet (325 mg total) by mouth daily with breakfast. 30 tablet 3  . FOLIC ACID PO Take 1 tablet by mouth every morning.    . furosemide (LASIX) 40 MG tablet Take 1 tablet (40 mg total) by mouth every morning. 30 tablet 2  . gabapentin (NEURONTIN) 400 MG capsule TAKE 2 CAPSULES BY MOUTH 2 TIMES DAILY. 120 capsule 0  . glucose blood (RELION GLUCOSE TEST STRIPS) test strip 1 each by Other route 4 (four) times daily. ICD code E11.65 400 each 3  . insulin aspart (NOVOLOG) 100 UNIT/ML injection Inject 80 Units into the skin 3 (three) times daily before meals. 3 vial 3  .  Insulin Glargine (LANTUS SOLOSTAR) 100 UNIT/ML Solostar Pen Inject 96 Units into the skin at bedtime. 5 pen 11  . Insulin Pen Needle (B-D ULTRAFINE III SHORT PEN) 31G X 8 MM MISC 1 application by Does not apply route daily. 100 each 3  . Insulin Syringe-Needle U-100 (BD INSULIN SYRINGE ULTRAFINE) 31G X 15/64" 1 ML MISC 1 each by Does not apply route 3 (three) times daily. 300 each 3  . Lancet Devices (ACCU-CHEK SOFTCLIX) lancets Use as instructed 1 each 0  . losartan (COZAAR) 100 MG tablet Take 1 tablet (100 mg total) by mouth every  morning. 30 tablet 3  . metFORMIN (GLUCOPHAGE) 500 MG tablet Take 1 tablet (500 mg total) by mouth 2 (two) times daily with a meal. 60 tablet 11  . methocarbamol (ROBAXIN) 750 MG tablet Take 1 tablet (750 mg total) by mouth every 8 (eight) hours as needed for muscle spasms. 90 tablet 2  . mometasone-formoterol (DULERA) 100-5 MCG/ACT AERO Take 2 puffs first thing in am and then another 2 puffs about 12 hours later. 1 Inhaler 11  . montelukast (SINGULAIR) 10 MG tablet Take 1 tablet (10 mg total) by mouth at bedtime. 30 tablet 11  . omeprazole (PRILOSEC) 20 MG capsule Take 1 capsule (20 mg total) by mouth daily before breakfast. 90 capsule 1  . ondansetron (ZOFRAN) 4 MG tablet Take 1 tablet (4 mg total) by mouth every 6 (six) hours as needed for nausea. 20 tablet 0  . Oyster Shell Calcium 500 MG TABS Take 0.4 tablets (500 mg total) by mouth 2 (two) times daily after a meal. 60 tablet 11  . PARoxetine (PAXIL) 20 MG tablet Take 1 tablet (20 mg total) by mouth daily. 30 tablet 5  . predniSONE (DELTASONE) 1 MG tablet Take 4 mg by mouth daily with breakfast.     . ranitidine (ZANTAC) 150 MG tablet Take 2 tablets (300 mg total) by mouth at bedtime. 60 tablet 5  . ReliOn Ultra Thin Lancets MISC 1 each by Does not apply route 4 (four) times daily. ICD code E11.65 400 each 3  . Respiratory Therapy Supplies (FLUTTER) DEVI Use as directed 1 each 0  . ULTICARE MICRO PEN NEEDLES 32G X 4 MM MISC USE AS DIRECTED 100 each 2  . verapamil (VERELAN PM) 360 MG 24 hr capsule Take 1 capsule (360 mg total) by mouth every morning. 30 capsule 11   Current Facility-Administered Medications on File Prior to Visit  Medication Dose Route Frequency Provider Last Rate Last Dose  . DOBUTamine (DOBUTREX) 1,000 mcg/mL in dextrose 5% 250 mL infusion  30 mcg/kg/min Intravenous Continuous Mihai Croitoru, MD 145.3 mL/hr at 04/22/15 1300 30 mcg/kg/min at 04/22/15 1300    Allergies:  Allergies  Allergen Reactions  . Shellfish Allergy  Anaphylaxis  . Septra [Sulfamethoxazole-Trimethoprim] Rash and Other (See Comments)    Facial redness with pruritus   . Hydrocortisone Rash    Review of Systems:  CONSTITUTIONAL: No fevers, chills, night sweats, or weight loss.  EYES: No visual changes or eye pain ENT: No hearing changes.  No history of nose bleeds.   RESPIRATORY: No cough, wheezing and shortness of breath.   CARDIOVASCULAR: Negative for chest pain, and palpitations.   GI: Negative for abdominal discomfort, blood in stools or black stools.  No recent change in bowel habits.   GU:  No history of incontinence.   MUSCLOSKELETAL: No history of joint pain or swelling.  No myalgias.   SKIN: Negative for lesions, rash,  and itching.   ENDOCRINE: Negative for cold or heat intolerance, polydipsia or goiter.   PSYCH:  No depression or anxiety symptoms.   NEURO: As Above.   Vital Signs:  BP 130/80 mmHg  Pulse 85  Ht _0  (1.473 m)  Wt 182 lb 5 oz (82.696 kg)  BMI 38.11 kg/m2  SpO2 98%  LMP 09/13/2015   General:  Wearing thoracic rigid brace, appears comfortable  Neurological Exam: MENTAL STATUS including orientation to time, place, person, recent and remote memory, attention span and concentration, language, and fund of knowledge is normal.    CRANIAL NERVES: Pupils equal round and reactive to light.  Normal conjugate, extra-ocular eye movements in all directions of gaze. Face is symmetric.   MOTOR:  Motor strength is 5/5 in all extremities, except bilateral ABP weakness 4+/5.  No atrophy, fasciculations or abnormal movements.  No pronator drift.  Tone is normal.    MSRs:  Reflexes are 3+ throughout except 2+/4 at the ankles.  SENSORY:  Reduced temperature over the medial distribution bilaterally.   COORDINATION/GAIT:  Normal finger-to- nose-finger and heel-to-shin.  Intact rapid alternating movements bilaterally.  Gait assisted with cane, slow and cautious but not unsteady   Data: EMG upper extremities  09/22/2015: 1. Bilateral median neuropathy at or distal to the wrist, consistent with clinical diagnosis of carpal tunnel syndrome. Overall, these findings are moderate in degree electrically. 2. Chronic C6 radiculopathy affecting the right upper extremity, moderate in degree electrically.   IMPRESSION/PLAN: 1.  Bilateral carpal tunnel syndrome, moderate in degree electrically  - Recommend wearing bilateral wrist splints  - If no improvement, we discussed referral to orthopeadic hand specialist for a steroid injection  - Pain controlled on gabapentin 859m BID  2.  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  Greater than 50% of this time was spent in counseling, explanation of diagnosis, planning of further management, and coordination of care.   Thank you for allowing me to participate in patient's care.  If I can answer any additional questions, I would be pleased to do so.    Sincerely,    Donika K. PPosey Pronto DO

## 2015-10-08 NOTE — Patient Instructions (Signed)
MRI cervical spine without contrast Continue to use wrist splints Return to clinic in 3 months, or sooner as needed

## 2015-10-12 ENCOUNTER — Telehealth: Payer: Self-pay | Admitting: Family Medicine

## 2015-10-12 MED FILL — BD INSULIN SYR 1 ML 6MMX31G: 31G X 15/64 | 33 days supply | Qty: 100 | Fill #0

## 2015-10-12 MED FILL — OYSTER SHELL CAL 500 MG TAB: 500 | 30 days supply | Qty: 60 | Fill #0

## 2015-10-12 MED FILL — metFORMIN HCL 1000 MG TABS: 1000 | 30 days supply | Qty: 60 | Fill #3

## 2015-10-12 MED FILL — ALBUTEROL 0.083% INHAL SOLN: (2.5 MG/3ML | 5 days supply | Qty: 90 | Fill #0

## 2015-10-12 MED FILL — METHOCARBAMOL 750 MG TABLET: 750 | 30 days supply | Qty: 90 | Fill #2

## 2015-10-12 NOTE — Telephone Encounter (Signed)
Pt. Called requesting a refill on some of her medication. Pt. Does not know the name of the medications. Please f/u

## 2015-10-13 ENCOUNTER — Encounter: Payer: Self-pay | Admitting: Adult Health

## 2015-10-13 ENCOUNTER — Ambulatory Visit (INDEPENDENT_AMBULATORY_CARE_PROVIDER_SITE_OTHER): Payer: Medicaid Other | Admitting: Adult Health

## 2015-10-13 VITALS — BP 120/72 | HR 89 | Temp 98.5°F | Ht <= 58 in | Wt 184.0 lb

## 2015-10-13 DIAGNOSIS — J45998 Other asthma: Secondary | ICD-10-CM | POA: Diagnosis not present

## 2015-10-13 NOTE — Progress Notes (Signed)
Chart and office note reviewed in detail along > agree with a/p as outlined  

## 2015-10-13 NOTE — Assessment & Plan Note (Signed)
Compensated on present regimen  As prednisone dose is going down  Consider on return Allergy profile, IgE and CBC w/ diff  Prev Diff w/ no eosinophil elevation but on high dose prednisone  Patient's medications were reviewed today and patient education was given. Computerized medication calendar was adjusted/completed            Plan  Follow med calendar closely and bring to each visit.  Follow up Dr. Melvyn Novas  In 2-3 months and As needed   Please contact office for sooner follow up if symptoms do not improve or worsen or seek emergency care

## 2015-10-13 NOTE — Progress Notes (Signed)
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  Onset was childhood but continued to have "good days and bad days" until 2010 and no good days since then even p pred started and no better since arrived in [place] summer 2015 with no evidence of any airflow obst by pfts 11/03/14    History of Present Illness  09/04/2014 1st [facility] office visit/ [facility]   Chief Complaint  Patient presents with  . Advice Only    Referred for chronic bronchitis. Recently moved from [place].   Every day's about the same since 2012 admitted to [place] every month but not since arrival in us/   maintaining on prednisone ? Dose plus theoph/ brethine/ advair 500 and nebs "all day long"  But interestingly less symptoms nocturnally. Cough is mostly dry assoc with hoarseness  rec Stop the advair, the brethine  and the theophylline Start dulera 100 Take 2 puffs first thing in am and then another 2 puffs about 12 hours later.  Continue prilosec 20 mg Take 30- 60 min before your first and last meals of the day  GERD diet  Only use your albuterol nebulizer  If needed  If you find you don't need the nebulizer as much, then we can start reducing your prednisone by 10 mg a week   Admit date: 09/07/2014 Discharge date: 09/09/2014  Discharge Diagnoses:  Active Hospital Problems   Diagnosis Date Noted  . Pneumonia 09/07/2014  . Morbid obesity 09/08/2014  . Non-English speaking patient 09/08/2014  . Diabetes mellitus type 2, uncontrolled 09/07/2014  . Asthma exacerbation 09/07/2014  . Hypertension 09/07/2014  . HTN (hypertension)      Patient is a 45 year old Hispanic female, non-English speaking with past history  of asthma on chronic prednisone, diabetes mellitus and hypertension who was admitted to the hospitalist service on the night of 12/27 for several days of persistent productive cough, wheezing and shortness of breath. Patient was noted in the emergency room to have a mild leukocytosis as well as an early developing left upper lobe pneumonia. Patient started on nebulizers, oxygen and antibiotics.  Hospital Course:  Principal Problem:  Acute respiratory failure/Asthma exacerbation/Pneumonia: Continue IV Levaquin, add incentive spirometer, changed by mouth prednisone to IV Solu-Medrol given significant wheezing. Patient and mildly tachycardic so adding back oxygen. Added nebulizers. By 12/29, patient looked to be significantly better and off of oxygen. With ambulation, she contraction saturations at 96% although she did have some significant tachycardia. Part of this looked to be from pain from her hip which is chronic. There is also some question based off of her pulmonologist note that the way she takes her inhalers to be causing her to do more harm than good. As per their recommendation, theophylline discontinued. Patient will continue on 5 more days of by mouth Levaquin to complete a seven-day course for this pneumonia Active Problems:  HTN (hypertension): Continue home meds  Diabetes mellitus type 2, uncontrolled with peripheral neuropathy: Continue Lantus, scheduled NovoLog plus sliding scale plus Neurontin. With sodium Medrol, CBGs even more elevated and patient required increased dose of Lantus and sliding scale  Hypertension: Continue home meds  Morbid obesity: Patient meets criteria with BMI greater  than 40.      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 HFU    Pt states that her breathing has improved some, but not back to baseline. SHe is still coughing up yellow sputum.    using neb saba avg twice  daily, feels theoph helps with "congestion" but able to do adls ok   >d/c theophylline/breathine   09/26/2014  Follow up and Med Review (Asthma/Steroid dependent)  Interpreter present for today's visit  patient does understand limited English, husband reads and understands more English Patient reports that she's had severe asthma as a child and has been on and off steroids dose of her life She is currently on prednisone 40 mg. Patient has been in the [place] for around 6-7 months Previously treated in [place]. She says she's had several hospitalizations. Most recent hospitalization was 2 weeks ago, she is starting to feel somewhat improved. Patient was seen in office last week. Her theophylline and Brethine was stopped. Patient has brought all of her medications in today for review We reviewed all her medications organize them into a medication count with patient education Patient is on multiple medications from [place] and locally. She is taking both budesonide and Dulera. Patient has had multiple steroid related complications including cataracts, compression fx and  Diabetes. She is currently being evaluated for right arm swelling. She has MRI and 2-D echo pending. She was treated with doxycycline. She denies any chest pain, orthopnea, PND, leg swelling, abdominal pain, nausea, vomiting. Does not have any PFTs on record. rec Stop Budesonide neb  Decrease Prednisone 5m 2 tabs alternating with 1.5 tabs for 2 weeks then 1.5 tabs daily for 2 weeks and then 1 tab daily and hold at this dose.  Follow med calendar closely and bring to each visit.     11/03/2014 f/u ov/Wert re: dtca asthma/ no rx today/ pred still at 30 mg  Chief Complaint  Patient presents with  . Follow-up    PFT done today. Pt states that her cough has resolved. Her breathing is some better. No new co's today.   still badly misunderstanding how to take her meds / unable to doing meaningful or accurate med rec    >>decrease pred 20     11/17/2014 Follow up : Difficult to control Asthma-steroid dependent /NP ov Interpreter is present for today's visit.  We reviewed all her medications organize them into a medication calendar with patient education It appears the patient is taking her medications correctly. Last visit. Patient's prednisone was decreased to 20 mg daily She has not had a flare of cough, wheezing or shortness of breath since last visit. rec In 2 weeks on 12/01/14 decrease Prednisone 261malternating 104maily until seen back in offce.  Follow med calendar closely and bring to each visit.  Please contact office for sooner follow up if symptoms do not improve or worsen or seek emergency care     12/15/2014 f/u ov/[facility] re: chronic asthma/ pseudoasthma / [place]  Chief Complaint  Patient presents with  . Follow-up    Pt states still feeling SOB especially with movement. Denies fever, cough, or chest tightness   on prednisone 20 mg  Plus neb 3 tid including am of ov due to variable sob day >>noct  rec Plan A = automatic = dulera 100 Take 2 puffs first thing in am and then  another 2 puffs about 12 hours later.  Work on inhaler technique:  relax and gently blow all the way out then take a nice smooth deep breath back in, triggering the inhaler at same time you start breathing in.  Hold for up to 5 seconds if you can.  Rinse and gargle with water when done Plan B = backup Only use your albuterol [place] as a rescue medication  Plan C = neb albuterol, only use it if try B first and it doesn't work Continue  prednisone to 20 mg daily      01/12/2015 f/u ov/[facility]ert re: asthma vs pseudoasthma/just on physiologic steroids per Dr Buddy Duty plus dulera 100 2bid Chief Complaint  Patient presents with  . Follow-up    Pt states that her breathing is doing well. She is using albuterol inhaler and neb both 2 x daily on average.     More day than noct events / having choking  episodes more than wheezing  Concern re tachycardia by primary care so metaprolol rec but she never started it.  >>no changes   02/17/2015 NP Follow up and Med review : asthma vs pseudoasthma/just on physilogic steroids per Dr Buddy Duty plus dulera 100 2bid Patient is accompanied by an interpreter Patient returns for a 4 week follow-up and medication review We reviewed all her medications organize them into a medication count with patient education Appears to be taking her medications correctly Patient says overall her breathing has been stable. She had a couple nights where she has some intermittent wheezing but this has improved. She appears the been changed to hydrocortisone. However, was unable to tolerate this and is now back on prednisone 10 mg daily. rec  Follow med calendar closely and bring to each visit.    03/31/2015 f/u ov/[facility]ert re: dtc asthma vs vcd / has med calendar but not using it correctly / maint on dulera 100 2bid / 5 mg daily pred Chief Complaint  Patient presents with  . Follow-up    asthma: tightness in chest.  some cough, but not much. hot weather making SOB worse.  sore throat, both ears hurt.   last night took hfa sab and 2 h later the neb saba  On avg taking saba hfa  sev times a day and neb sev times a day also  Last rx 4 h prior to OV  and feels she could use it again now for sob and chest tight >>no changes   05/12/2015 Follow up : Severe Asthma -steroid dependent Pt returns for 1 month follow up .  Says she is doing well overall .  Feels better when temp are cooler  Cough, congestion and wheezing are less.  Remains on prednisone 79m daily  Without flare of wheezing /dyspea.  Patient has had multiple steroid related complications including cataracts, compression fx and  Diabetes. Over last 6 months has been tapered slowly down from high dose steroids to prednisone 5833mdaily  Remains on prednisone 33m58maily  Without flare of wheezing /dyspea.  Previously on  Advair, brethine and theophylline which were stopped over 6 months ago, now [facility] good control and no ER visits over last 6 months .  We reviewed all her meds and updated her very complex medication regimen with pt education  She says she understands and reads english with family at home that speaks/reads english.  Has medicaid for insurance which meds are affordable. She is married with no kids.  rec Follow med calendar closely and  bring to each visit.  Follow up Dr. Melvyn Novas  In 2-3 months and As needed    07/13/2015  f/u ov/[facility]ert re: asthma vs vcd  On dulera 200 and pred 4mg  daily  Chief Complaint  Patient presents with  . Follow-up    Pt states breathing is "so, so". She had to increase the Dulera from 100 mcg to 200 mcg approx 2 wks ago, and she is using albuterol inhaler 3 x per day on average. She has been using neb every night.    Over using saba the same even on higher dose of dulera but more hoarse >>no changes   10/13/2015 Follow up : Asthma +/- VCD  Pt returns for 3 month follow up .  Pt is accompanied by interpretor for visit.  We reviewed her meds and organized them into a med calendar with pt educaiton  Appears to be taking meds correctly . Says her breathing is doing okay.  Does have SOB and wheezing at times. -comes and goes .  Denies any chest tightness, sinus pressure/drainage, fever or vomiting.    Current Medications, Allergies, Complete Past Medical History, Past Surgical History, Family History, and Social History were reviewed in [employer] record.  ROS  The following are not active complaints unless bolded sore throat, dysphagia, dental problems, itching, sneezing,  nasal congestion or excess/ purulent secretions, ear ache,   fever, chills, sweats, unintended wt loss, classically pleuritic or exertional cp, hemoptysis,  orthopnea pnd or leg swelling, presyncope, palpitations, abdominal pain, anorexia, nausea, vomiting, diarrhea  or  change in bowel or bladder habits, change in stools or urine, dysuria,hematuria,  rash, arthralgias, visual complaints, headache, numbness, weakness or ataxia or problems with walking or coordination,  change in mood/affect or memory.                 Objective:   Physical Exam   Mildly hoarse  very cushingnoid amb hispanic female nad   01/12/2015  186 > 177 02/17/2015 >  03/31/2015  181 >180 >180 05/12/2015 > 07/13/2015   182 > 184 10/13/2015     Vital signs reviewed       HEENT: nl dentition, turbinates, and orophanx. Nl external ear canals without cough reflex   NECK :  without JVD/Nodes/TM/ nl carotid upstrokes bilaterally   LUNGS: no acc muscle use, clear to A and P bilaterally without cough on insp or exp maneuvers -   CV:  RRR  no s3 or murmur or increase in P2, no edema   ABD:  soft and nontender with nl excursion in the supine position. No bruits or organomegaly, bowel sounds nl  MS:  warm without deformities, calf tenderness, cyanosis or clubbing   SKIN: warm and dry without lesions              Assessment & Plan:

## 2015-10-13 NOTE — Patient Instructions (Signed)
Follow med calendar closely and bring to each visit.  Follow up Dr. Melvyn Novas  In 2-3 months and As needed   Please contact office for sooner follow up if symptoms do not improve or worsen or seek emergency care

## 2015-10-14 ENCOUNTER — Other Ambulatory Visit: Payer: Self-pay | Admitting: Family Medicine

## 2015-10-14 NOTE — Telephone Encounter (Signed)
Pt stated will call pharmacy to request refills

## 2015-10-15 ENCOUNTER — Telehealth: Payer: Self-pay | Admitting: Neurology

## 2015-10-15 MED FILL — ULTICARE PEN NDL 4MM 32G: 32G X 4 MM | 25 days supply | Qty: 100 | Fill #0

## 2015-10-15 NOTE — Addendum Note (Signed)
Addended by: Osa Craver on: 10/15/2015 11:18 AM   Modules accepted: Orders, Medications

## 2015-10-15 NOTE — Telephone Encounter (Signed)
MRI cervical spine wo contrast 10/13/2015:  Remote T4 compression fracture, otherwise normal MRI cervical spine.  Raven Turner, please inform patient her MRI neck is normal.    Endiya Klahr K. Posey Pronto, DO

## 2015-10-15 NOTE — Telephone Encounter (Signed)
Patient notified

## 2015-10-20 ENCOUNTER — Encounter: Payer: Self-pay | Admitting: Podiatry

## 2015-10-20 ENCOUNTER — Ambulatory Visit (INDEPENDENT_AMBULATORY_CARE_PROVIDER_SITE_OTHER): Payer: Medicaid Other | Admitting: Podiatry

## 2015-10-20 DIAGNOSIS — B351 Tinea unguium: Secondary | ICD-10-CM | POA: Diagnosis not present

## 2015-10-20 DIAGNOSIS — M79676 Pain in unspecified toe(s): Secondary | ICD-10-CM | POA: Diagnosis not present

## 2015-10-20 MED FILL — MONTELUKAST SOD 10 MG TAB: 10 | 30 days supply | Qty: 30 | Fill #7

## 2015-10-20 MED FILL — ATORVASTATIN 20 MG TABLET: 20 | 30 days supply | Qty: 30 | Fill #6

## 2015-10-20 MED FILL — cloNIDine HCL 0.2 MG TABS: 0.2 | 30 days supply | Qty: 90 | Fill #9

## 2015-10-20 NOTE — Progress Notes (Signed)
Patient ID: Raven Turner, female   DOB: 08-02-1971, 45 y.o.   MRN: [id number]  Subjective: This patient presents for scheduled visit complaining of uncomfortable toenails and requests toenail debridement. Patient speaks minimal English and her husband who also speaks minimally is present in the treatment room plus interpreter for Spanish  Objective: Patient appears responsive No open skin lesions bilaterally Mild peripheral edema bilaterally Toenails second right and left hallux are elongated, brittle discolored Remaining toenails are incurvated and elongated  Assessment: Diabetic Mycotic toenails with symptoms 2 Incurvated toenails 8  Plan: Debridement toenails 6-10 mechanically and electrically without any bleeding  Reappoint 4 months

## 2015-10-20 NOTE — Patient Instructions (Signed)
Diabetes y cuidados del pie (Diabetes and Foot Care) La diabetes puede ser la causa de que el flujo sanguneo (circulacin) en las piernas y los pies sea deficiente. Debido a esto, la piel de los pies se torna ms delgada, se rompe con facilidad y se cura ms lentamente. La piel puede estar seca, despellejarse y agrietarse. Tambin pueden estar daados los nervios de las piernas y de los pies lo que provoca una disminucin de la sensibilidad. Es posible que no advierta heridas ms pequeas en los pies, que pueden causar infecciones graves. Cuidar sus pies es una de las cosas ms importantes que puede hacer por usted mismo.  INSTRUCCIONES PARA EL CUIDADO EN EL HOGAR  Use siempre calzado, an dentro de su casa. No camine descalzo. Caminar descalzo facilita que se lastime.  Controle sus pies diariamente para observar ampollas, cortes y enrojecimiento. Si no puede ver la planta del pie, use un espejo o pdale ayuda a otra persona.  Lave sus pies con agua tibia (no use agua caliente) y un jabn suave. Seque bien sus pies, y la zona entre los dedos dando palmaditas, hasta que estn completamente secos. Noremoje los pies, ya que esto puede resecar la piel.  Aplique una locin hidratante o vaselina (que no contenga alcohol ni perfume) en los pies y en las uas secas y quebradizas. No aplique locin entre los dedos.  Recorte las uas en forma recta. No escarbe debajo de las uas o alrededor de las cutculas. Lime los bordes de las uas con una lima o esmeril.  No corte las durezas o callosidades, ni trate de quitarlas con medicamentos.  Use calcetines de algodn o medias limpias todos los das. Asegrese de que no le ajusten demasiado. Nouse calcetines que le lleguen a las rodillas, ya que podran disminuir el flujo de sangre a las piernas.  Use zapatos de cuero que le queden bien y que sean acolchados. Para amoldar los zapatos, clcelos slo algunas horas por da. Esto evitar lesiones en los pies. Revise  siempre los zapatos antes de ponerlos para asegurarse de que no haya objetos en su interior.  No cruce las piernas. Esto puede disminuir el flujo de sangre a los pies.  Si algo le ha raspado, cortado o lastimado la piel de los pies, mantenga la piel de esa zona limpia y seca. Debe higienizar estas zonas con agua y un jabn suave. No limpie la zona con agua oxigenada, alcohol ni yodo.  Cuando se quite un vendaje adhesivo, asegrese de no daar la piel.  Si tiene una herida, obsrvela varias veces por da para asegurarse de que se est curando.  No use bolsas de agua caliente ni almohadillas trmicas. Podran causar quemaduras. Si ha perdido la sensibilidad en los pies o las piernas, no sabr lo que le est sucediendo hasta que sea demasiado tarde.  Asegrese de que su mdico le haga un examen completo de los pies por lo menos una vez al ao, o con ms frecuencia si usted tiene problemas en los pies. Informe todos los cortes, llagas o moretones a su mdico inmediatamente. SOLICITE ATENCIN MDICA SI:   Tiene una lesin que no se cura.  Tiene cortes o rajaduras en la piel.  Tiene una ua encarnada.  Nota una zona irritada en las piernas o los pies.  Siente una sensacin de ardor u hormigueo en las piernas o los pies.  Siente dolor o calambres en las piernas o los pies.  Las piernas o los pies estn adormecidos.    Siente los pies siempre fros. SOLICITE ATENCIN MDICA DE INMEDIATO SI:   Presenta enrojecimiento, hinchazn o aumento del dolor en una herida.  Nota una lnea roja que sube por pierna.  Aparece pus en la herida.  Le sube la fiebre o segn lo que le indique el mdico.  Advierte un olor ftido que proviene de una lcera o una herida.   Esta informacin no tiene [employer] el consejo del mdico. Asegrese de hacerle al mdico cualquier pregunta que tenga.   Document Released: 08/29/2005 Document Revised: 05/01/2013 Elsevier Interactive Patient Education [employer]

## 2015-10-26 ENCOUNTER — Other Ambulatory Visit: Payer: Self-pay | Admitting: Family Medicine

## 2015-10-26 MED FILL — PARoxetine HCL 20 MG TABS: 20 | 30 days supply | Qty: 30 | Fill #1

## 2015-10-26 MED FILL — LOSARTAN POTASSIUM 100 MG T: 100 | 30 days supply | Qty: 30 | Fill #2

## 2015-10-28 ENCOUNTER — Emergency Department (HOSPITAL_COMMUNITY)
Admission: EM | Admit: 2015-10-28 | Discharge: 2015-10-28 | Disposition: A | Discharge status: Home / Self Care | Payer: Medicaid Other | Source: Home / Self Care | Attending: Family Medicine | Admitting: Family Medicine

## 2015-10-28 ENCOUNTER — Encounter (HOSPITAL_COMMUNITY): Payer: Self-pay | Admitting: *Deleted

## 2015-10-28 ENCOUNTER — Telehealth: Payer: Self-pay | Admitting: Family Medicine

## 2015-10-28 DIAGNOSIS — S0083XA Contusion of other part of head, initial encounter: Secondary | ICD-10-CM

## 2015-10-28 DIAGNOSIS — F411 Generalized anxiety disorder: Secondary | ICD-10-CM

## 2015-10-28 MED ORDER — AMITRIPTYLINE HCL 25 MG PO TABS: 25.0000 mg | ORAL_TABLET | Freq: Every day | ORAL | Status: DC

## 2015-10-28 NOTE — Telephone Encounter (Signed)
Patient needs a medication refill for Klonopin Please follow up.

## 2015-10-28 NOTE — ED Notes (Signed)
Pt  Reports  Symptoms  Of  Headache   She  States  She  Headbutted         Another  Individual     sev  Weeks  Ago  She  Denies  Any  Vomiting  She  Is  Sitting upright on the  Exam table  Speaking  In  Complete  sentances

## 2015-10-28 NOTE — Discharge Instructions (Signed)
Take medicine at bedtime and see your doctor if further problems.

## 2015-10-28 NOTE — ED Provider Notes (Signed)
CSN: [id number]     Arrival date & time 10/28/15  1532 History   First MD Initiated Contact with Patient 10/28/15 1715     Chief Complaint  Patient presents with  . Headache   (Consider location/radiation/quality/duration/timing/severity/associated sxs/prior Treatment) Patient is a 45 y.o. female presenting with headaches. The history is provided by the patient.  Headache Pain location:  Frontal Quality:  Dull Onset quality:  Sudden (head butted grandson 3 wks ago and still sore to left forehead area, no neuro sx.) Duration:  3 weeks Progression:  Unchanged Chronicity:  New Similar to prior headaches: no   Relieved by:  None tried Ineffective treatments:  None tried Associated symptoms: no blurred vision, no dizziness, no loss of balance, no numbness, no paresthesias, no photophobia and no vomiting     Past Medical History  Diagnosis Date  . Asthma   . Cushing syndrome ([place]) 2013   . Depression   . Anemia   . Osteopenia 11/19/2013    Dx with osteopenia in lumbar vertebra with partial compression of L1  . Diabetes ([place]) 2013   . Diabetes mellitus with neuropathy ([place])   . HTN (hypertension) 2005   . COPD (chronic obstructive pulmonary disease) ([place]) 2013  . Compression fracture   . Bilateral hand numbness 06/25/2015  . Bone pain 02/27/2015    Diffuse bone pain in legs mostly but also in arms    . Breast pain, left 03/27/2015  . Callus of heel 12/16/2014  . Compression fracture of L1 lumbar vertebra (HCC) 08/26/2014  . Compression fracture of T12 vertebra ([place]) 08/28/2014  . Diabetes mellitus type 2, uncontrolled ([place]) 08/26/2014    Sees Dr. Katy Fitch.  Last visit 11/27/14.  No diabetic retinopathy    . Diastolic dysfunction without heart failure 10/14/2014    Grade 2 diastolic dysfunction 2/2 pulm HTN   . Generalized anxiety disorder 03/27/2015  . Hx of cataract surgery 11/03/2014  . LUQ abdominal pain 07/17/2015  . Medication management 05/12/2015  . Morbid obesity ([place]) 09/08/2014   . 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 Non-English speaking patient 09/08/2014  . OSA (obstructive sleep apnea) 05/20/2015  . Pain due to onychomycosis of toenail 03/27/2015  . Secondary Cushing's syndrome/ iatrogenic   . Skin rash 07/17/2015  . Tachycardia 01/13/2015  . Upper airway cough syndrome 09/05/2014    Followed in Pulmonary clinic/ [facility]/ [place]  - Trial off advair      09/05/2014 >>>  - trial off theoph 09/17/2014 and added flutter  - rec reduce dulera to 100 2 bid 07/13/2015     . Vision changes 06/25/2015  . Vitamin D deficiency 08/28/2014   Past Surgical History  Procedure Laterality Date  . Cataract extraction Bilateral 2014  . Vaginal hysterectomy     Family History  Problem Relation Age of Onset  . Diabetes Mother   . Stroke Father   . Asthma Father   . Hypertension Sister   . Hypertension Brother    Social History  Substance Use Topics  . Smoking status: Never Smoker   . Smokeless tobacco: Never Used  . Alcohol Use: No   OB History    No data available     Review of Systems  Constitutional: Negative.   Eyes: Negative for blurred vision and photophobia.  Gastrointestinal: Negative for vomiting.  Neurological: Positive for headaches. Negative for dizziness, facial asymmetry, numbness, paresthesias and loss of balance.  All other systems reviewed and are negative.   Allergies  Shellfish allergy; Septra;  and Hydrocortisone  Home Medications   Prior to Admission medications   Medication Sig Start Date End Date Taking? Authorizing Provider  acetaminophen-codeine (TYLENOL #3) 300-30 MG tablet TAKE 1 TO 2 TABLETS BY MOUTH AT BEDTIME AS NEEDED FOR MODERATE PAIN Patient taking differently: TAKE 1 TO 2 TABLETS BY MOUTH AT BEDTIME AS NEEDED FOR SEVERE PAIN OR COUGH 09/24/15   Josalyn Funches, MD  albuterol (PROVENTIL HFA;VENTOLIN HFA) 108 (90 Base) MCG/ACT inhaler Inhale 2 puffs into the lungs every 4 (four) hours as needed for wheezing or shortness of breath  (((PLAN B))).    Historical Provider, MD  albuterol (PROVENTIL) (2.5 MG/3ML) 0.083% nebulizer solution Take 3 mLs (2.5 mg total) by nebulization every 4 (four) hours as needed for wheezing or shortness of breath (plan C). 08/21/15   Boykin Nearing, MD  aspirin 325 MG tablet Take 325 mg by mouth every morning.    Historical Provider, MD  atorvastatin (LIPITOR) 20 MG tablet Take 20 mg by mouth daily at 6 PM.    Historical Provider, MD  Blood Glucose Monitoring Suppl (ACCU-CHEK AVIVA PLUS) W/DEVICE KIT Used as directed 11/05/14   Boykin Nearing, MD  cholecalciferol (VITAMIN D) 1000 UNITS tablet Take 1,000 Units by mouth every morning.    Historical Provider, MD  clonazePAM (KLONOPIN) 0.5 MG tablet Take 1 tablet (0.5 mg total) by mouth 2 (two) times daily as needed (trouble sleeping and anxiety). 07/23/15   Josalyn Funches, MD  cloNIDine (CATAPRES) 0.2 MG tablet Take 1 tablet (0.2 mg total) by mouth 3 (three) times daily. 12/15/14   Fay Records, MD  dextromethorphan-guaiFENesin [facility] DM) 30-600 MG 12hr tablet Take 1 tablet by mouth 2 (two) times daily as needed (with flutter valve for cough and congestion).    Historical Provider, MD  ferrous sulfate 325 (65 FE) MG tablet Take 1 tablet (325 mg total) by mouth daily with breakfast. 12/31/14   Boykin Nearing, MD  FOLIC ACID PO Take 1 tablet by mouth every morning.    Historical Provider, MD  furosemide (LASIX) 40 MG tablet Take 1 tablet (40 mg total) by mouth every morning. 03/27/15   Josalyn Funches, MD  gabapentin (NEURONTIN) 400 MG capsule TAKE 2 CAPSULES BY MOUTH 2 TIMES DAILY. 09/09/15   Josalyn Funches, MD  glucose blood (RELION GLUCOSE TEST STRIPS) test strip 1 each by Other route 4 (four) times daily. ICD code E11.65 07/09/15   Boykin Nearing, MD  insulin aspart (NOVOLOG) 100 UNIT/ML injection Inject 80 Units into the skin 3 (three) times daily before meals. 07/08/15   Josalyn Funches, MD  Insulin Glargine (LANTUS SOLOSTAR) 100 UNIT/ML Solostar  Pen Inject 96 Units into the skin at bedtime. 09/25/15   Josalyn Funches, MD  Insulin Pen Needle (B-D ULTRAFINE III SHORT PEN) 31G X 8 MM MISC 1 application by Does not apply route daily. 03/27/15   Josalyn Funches, MD  Insulin Syringe-Needle U-100 (BD INSULIN SYRINGE ULTRAFINE) 31G X 15/64" 1 ML MISC 1 each by Does not apply route 3 (three) times daily. 03/27/15   Boykin Nearing, MD  Lancet Devices [facility]) lancets Use as instructed 04/03/15   Boykin Nearing, MD  losartan (COZAAR) 100 MG tablet Take 1 tablet (100 mg total) by mouth every morning. 08/24/15   Boykin Nearing, MD  metFORMIN (GLUCOPHAGE) 500 MG tablet Take 1 tablet (500 mg total) by mouth 2 (two) times daily with a meal. Patient taking differently: Take 1,000 mg by mouth 2 (two) times daily with a meal.  03/27/15  Boykin Nearing, MD  methocarbamol (ROBAXIN) 750 MG tablet Take 1 tablet (750 mg total) by mouth every 8 (eight) hours as needed for muscle spasms. 03/30/15   Josalyn Funches, MD  mometasone-formoterol (DULERA) 100-5 MCG/ACT AERO Take 2 puffs first thing in am and then another 2 puffs about 12 hours later. 07/13/15   Tanda Rockers, MD  montelukast (SINGULAIR) 10 MG tablet Take 1 tablet (10 mg total) by mouth at bedtime. 03/27/15   Boykin Nearing, MD  omeprazole (PRILOSEC) 20 MG capsule Take 1 capsule (20 mg total) by mouth daily before breakfast. 07/23/15   Boykin Nearing, MD  [place] Calcium 500 MG TABS Take 0.4 tablets (500 mg total) by mouth 2 (two) times daily after a meal. 03/27/15   Josalyn Funches, MD  PARoxetine (PAXIL) 20 MG tablet Take 1 tablet (20 mg total) by mouth daily. Patient taking differently: Take 20 mg by mouth every morning.  09/25/15   Josalyn Funches, MD  predniSONE (DELTASONE) 1 MG tablet Take 4 mg by mouth daily with breakfast.     Historical Provider, MD  ranitidine (ZANTAC) 150 MG tablet TAKE 2 TABLETS BY MOUTH EVERY NIGHT AT BEDTIME 10/15/15   Josalyn Funches, MD  ReliOn Ultra Thin  Lancets MISC 1 each by Does not apply route 4 (four) times daily. ICD code E11.65 03/27/15   Boykin Nearing, MD  Respiratory Therapy Supplies (FLUTTER) DEVI Use as directed 09/16/14   Tanda Rockers, MD  Flossie Buffy MICRO PEN NEEDLES 32G X 4 MM MISC USE AS DIRECTED 09/08/15   Boykin Nearing, MD  verapamil (VERELAN PM) 360 MG 24 hr capsule Take 1 capsule (360 mg total) by mouth every morning. 03/27/15   Boykin Nearing, MD   Meds Ordered and Administered this Visit  Medications - No data to display  BP 127/83 mmHg  Pulse 90  Temp(Src) 98.5 F (36.9 C) (Oral)  Resp 18  SpO2 97%  LMP 09/13/2015 No data found.   Physical Exam  Constitutional: She is oriented to person, place, and time. She appears well-developed and well-nourished. No distress.  Neck: Normal range of motion. Neck supple.  Lymphadenopathy:    She has no cervical adenopathy.  Neurological: She is alert and oriented to person, place, and time. No cranial nerve deficit.  Skin: Skin is warm and dry.  Nursing note and vitals reviewed.   ED Course  Procedures (including critical care time)  Labs Review Labs Reviewed - No data to display  Imaging Review No results found.   Visual Acuity Review  Right Eye Distance:   Left Eye Distance:   Bilateral Distance:    Right Eye Near:   Left Eye Near:    Bilateral Near:         MDM  No diagnosis found.  Meds ordered this encounter  Medications  . amitriptyline (ELAVIL) 25 MG tablet    Sig: Take 1 tablet (25 mg total) by mouth at bedtime.    Dispense:  30 tablet    Refill:  0      Billy Fischer, MD 10/28/15 1736

## 2015-10-29 MED ORDER — CLONAZEPAM 0.5 MG PO TABS: 0.5000 mg | ORAL_TABLET | Freq: Two times a day (BID) | ORAL | Status: DC | PRN

## 2015-10-29 NOTE — Telephone Encounter (Signed)
Klonopin ordered and ready for pick up

## 2015-10-30 NOTE — Telephone Encounter (Signed)
Pt notified Rx at front office  

## 2015-11-05 ENCOUNTER — Encounter: Payer: Self-pay | Admitting: Family Medicine

## 2015-11-05 ENCOUNTER — Ambulatory Visit: Payer: Medicaid Other | Attending: Family Medicine | Admitting: Family Medicine

## 2015-11-05 VITALS — BP 115/74 | HR 93 | Temp 98.5°F | Resp 16 | Ht <= 58 in | Wt 184.0 lb

## 2015-11-05 DIAGNOSIS — Z794 Long term (current) use of insulin: Secondary | ICD-10-CM | POA: Diagnosis not present

## 2015-11-05 DIAGNOSIS — K769 Liver disease, unspecified: Secondary | ICD-10-CM | POA: Diagnosis not present

## 2015-11-05 DIAGNOSIS — R200 Anesthesia of skin: Secondary | ICD-10-CM

## 2015-11-05 DIAGNOSIS — I10 Essential (primary) hypertension: Secondary | ICD-10-CM

## 2015-11-05 DIAGNOSIS — Z7982 Long term (current) use of aspirin: Secondary | ICD-10-CM | POA: Diagnosis not present

## 2015-11-05 DIAGNOSIS — L089 Local infection of the skin and subcutaneous tissue, unspecified: Secondary | ICD-10-CM | POA: Insufficient documentation

## 2015-11-05 DIAGNOSIS — E1165 Type 2 diabetes mellitus with hyperglycemia: Secondary | ICD-10-CM | POA: Insufficient documentation

## 2015-11-05 DIAGNOSIS — IMO0001 Reserved for inherently not codable concepts without codable children: Secondary | ICD-10-CM

## 2015-11-05 DIAGNOSIS — G5603 Carpal tunnel syndrome, bilateral upper limbs: Secondary | ICD-10-CM | POA: Diagnosis not present

## 2015-11-05 DIAGNOSIS — M549 Dorsalgia, unspecified: Secondary | ICD-10-CM | POA: Insufficient documentation

## 2015-11-05 DIAGNOSIS — Z7984 Long term (current) use of oral hypoglycemic drugs: Secondary | ICD-10-CM | POA: Insufficient documentation

## 2015-11-05 DIAGNOSIS — R208 Other disturbances of skin sensation: Secondary | ICD-10-CM

## 2015-11-05 DIAGNOSIS — S0990XA Unspecified injury of head, initial encounter: Secondary | ICD-10-CM | POA: Insufficient documentation

## 2015-11-05 DIAGNOSIS — R51 Headache: Secondary | ICD-10-CM | POA: Diagnosis not present

## 2015-11-05 DIAGNOSIS — Z79899 Other long term (current) drug therapy: Secondary | ICD-10-CM | POA: Diagnosis not present

## 2015-11-05 DIAGNOSIS — L03311 Cellulitis of abdominal wall: Secondary | ICD-10-CM

## 2015-11-05 DIAGNOSIS — G8929 Other chronic pain: Secondary | ICD-10-CM | POA: Diagnosis not present

## 2015-11-05 LAB — BASIC METABOLIC PANEL
BUN: 9 mg/dL (ref 7–25)
CO2: 23 mmol/L (ref 20–31)
Calcium: 9.5 mg/dL (ref 8.6–10.2)
Chloride: 102 mmol/L (ref 98–110)
Creat: 0.63 mg/dL (ref 0.50–1.10)
Glucose, Bld: 174 mg/dL — ABNORMAL HIGH (ref 65–99)
Potassium: 4.3 mmol/L (ref 3.5–5.3)
Sodium: 139 mmol/L (ref 135–146)

## 2015-11-05 LAB — GLUCOSE, POCT (MANUAL RESULT ENTRY): POC Glucose: 205 mg/dl — AB (ref 70–99)

## 2015-11-05 MED ORDER — MUPIROCIN CALCIUM 2 % EX CREA: 1.0000 "application " | TOPICAL_CREAM | Freq: Three times a day (TID) | CUTANEOUS | Status: DC

## 2015-11-05 NOTE — Patient Instructions (Addendum)
Claretha was seen today for back pain.  Diagnoses and all orders for this visit:  Uncontrolled diabetes mellitus type 2 without complications, unspecified long term insulin use status (HCC) -     POCT glucose (manual entry)  Essential hypertension -     Basic Metabolic Panel  Chronic back pain -     Ambulatory referral to Pain Clinic  Bilateral hand numbness -     Ambulatory referral to Orthopedic Surgery  Cellulitis of abdominal wall -     mupirocin cream (BACTROBAN) 2 %; Apply 1 application topically 3 (three) times daily.    Stop elavil for headaches Potassium check today Liver function test every 6 months   F.u in 2 months for diabetes   Dr .Adrian Blackwater

## 2015-11-05 NOTE — Progress Notes (Signed)
F/U back pain  Pain scale #5 No tobacco user  No suicidal thought in the past two weeks

## 2015-11-05 NOTE — Assessment & Plan Note (Signed)
Hand numbness consistent with carpal tunnel without improvement with conservative measures  Ortho referral placed

## 2015-11-05 NOTE — Progress Notes (Signed)
 Subjective:  Patient ID: Raven Turner, female    DOB: 10/25/1970  Age: 45 y.o. MRN: [id number]  CC: Back Pain  Spanish interpreter utilized  HPI Raven Turner presents for    1. 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 Taking tylenol #3 at bedtime for elevated liver enzymes in setting of fatty liver disease. She has not heard from pain management. She would like to establish with pain management.   2. Bilateral carpal tunnel: in both hands. No improvement with bracing and gabapentin. She has a cervical MRI that revealed a T4 compression fracture but was otherwise normal.   3.  Low potassium: potassium was low last month then normal on recheck. She takes daily lasix 40 mg. She takes daily losartan 100 mg.   4. Skin wounds: she is getting pustules on abdomen and between thighs. X 4 weeks. No itching. Some pain. No fever or chills.   4. Headaches: she had head trauma with her grandson 4 weeks ago. She developed a contusion. She has headache for 2 weeks. Went to urgent care. Started on elavil 25 mg daily. No headache now.   Social History  Substance Use Topics  . Smoking status: Never Smoker   . Smokeless tobacco: Never Used  . Alcohol Use: No    Outpatient Prescriptions Prior to Visit  Medication Sig Dispense Refill  . acetaminophen-codeine (TYLENOL #3) 300-30 MG tablet TAKE 1 TO 2 TABLETS BY MOUTH AT BEDTIME AS NEEDED FOR MODERATE PAIN (Patient taking differently: TAKE 1 TO 2 TABLETS BY MOUTH AT BEDTIME AS NEEDED FOR SEVERE PAIN OR COUGH) 60 tablet 2  . albuterol (PROVENTIL HFA;VENTOLIN HFA) 108 (90 Base) MCG/ACT inhaler Inhale 2 puffs into the lungs every 4 (four) hours as needed for wheezing or shortness of breath (((PLAN B))).    . albuterol (PROVENTIL) (2.5 MG/3ML) 0.083% nebulizer solution Take 3 mLs (2.5 mg total) by nebulization every 4 (four) hours as needed for wheezing or shortness of breath (plan C). 75 mL 3  . amitriptyline  (ELAVIL) 25 MG tablet Take 1 tablet (25 mg total) by mouth at bedtime. 30 tablet 0  . aspirin 325 MG tablet Take 325 mg by mouth every morning.    . atorvastatin (LIPITOR) 20 MG tablet Take 20 mg by mouth daily at 6 PM.    . Blood Glucose Monitoring Suppl (ACCU-CHEK AVIVA PLUS) W/DEVICE KIT Used as directed 1 kit 0  . cholecalciferol (VITAMIN D) 1000 UNITS tablet Take 1,000 Units by mouth every morning.    . clonazePAM (KLONOPIN) 0.5 MG tablet Take 1 tablet (0.5 mg total) by mouth 2 (two) times daily as needed (trouble sleeping and anxiety). 30 tablet 2  . cloNIDine (CATAPRES) 0.2 MG tablet Take 1 tablet (0.2 mg total) by mouth 3 (three) times daily. 90 tablet 11  . dextromethorphan-guaiFENesin (MUCINEX DM) 30-600 MG 12hr tablet Take 1 tablet by mouth 2 (two) times daily as needed (with flutter valve for cough and congestion).    . ferrous sulfate 325 (65 FE) MG tablet Take 1 tablet (325 mg total) by mouth daily with breakfast. 30 tablet 3  . FOLIC ACID PO Take 1 tablet by mouth every morning.    . furosemide (LASIX) 40 MG tablet Take 1 tablet (40 mg total) by mouth every morning. 30 tablet 2  . gabapentin (NEURONTIN) 400 MG capsule TAKE 2 CAPSULES BY MOUTH 2 TIMES DAILY. 120 capsule 0  . glucose blood (RELION GLUCOSE TEST STRIPS) test strip 1 each   by Other route 4 (four) times daily. ICD code E11.65 400 each 3  . insulin aspart (NOVOLOG) 100 UNIT/ML injection Inject 80 Units into the skin 3 (three) times daily before meals. 3 vial 3  . Insulin Glargine (LANTUS SOLOSTAR) 100 UNIT/ML Solostar Pen Inject 96 Units into the skin at bedtime. 5 pen 11  . Insulin Pen Needle (B-D ULTRAFINE III SHORT PEN) 31G X 8 MM MISC 1 application by Does not apply route daily. 100 each 3  . Insulin Syringe-Needle U-100 (BD INSULIN SYRINGE ULTRAFINE) 31G X 15/64" 1 ML MISC 1 each by Does not apply route 3 (three) times daily. 300 each 3  . Lancet Devices (ACCU-CHEK SOFTCLIX) lancets Use as instructed 1 each 0  .  losartan (COZAAR) 100 MG tablet Take 1 tablet (100 mg total) by mouth every morning. 30 tablet 3  . metFORMIN (GLUCOPHAGE) 500 MG tablet Take 1 tablet (500 mg total) by mouth 2 (two) times daily with a meal. (Patient taking differently: Take 1,000 mg by mouth 2 (two) times daily with a meal. ) 60 tablet 11  . methocarbamol (ROBAXIN) 750 MG tablet Take 1 tablet (750 mg total) by mouth every 8 (eight) hours as needed for muscle spasms. 90 tablet 2  . mometasone-formoterol (DULERA) 100-5 MCG/ACT AERO Take 2 puffs first thing in am and then another 2 puffs about 12 hours later. 1 Inhaler 11  . montelukast (SINGULAIR) 10 MG tablet Take 1 tablet (10 mg total) by mouth at bedtime. 30 tablet 11  . omeprazole (PRILOSEC) 20 MG capsule Take 1 capsule (20 mg total) by mouth daily before breakfast. 90 capsule 1  . [place] Calcium 500 MG TABS Take 0.4 tablets (500 mg total) by mouth 2 (two) times daily after a meal. 60 tablet 11  . PARoxetine (PAXIL) 20 MG tablet Take 1 tablet (20 mg total) by mouth daily. (Patient taking differently: Take 20 mg by mouth every morning. ) 30 tablet 5  . predniSONE (DELTASONE) 1 MG tablet Take 4 mg by mouth daily with breakfast.     . ranitidine (ZANTAC) 150 MG tablet TAKE 2 TABLETS BY MOUTH EVERY NIGHT AT BEDTIME 60 tablet 2  . ReliOn Ultra Thin Lancets MISC 1 each by Does not apply route 4 (four) times daily. ICD code E11.65 400 each 3  . Respiratory Therapy Supplies (FLUTTER) DEVI Use as directed 1 each 0  . ULTICARE MICRO PEN NEEDLES 32G X 4 MM MISC USE AS DIRECTED 100 each 2  . verapamil (VERELAN PM) 360 MG 24 hr capsule Take 1 capsule (360 mg total) by mouth every morning. 30 capsule 11   Facility-Administered Medications Prior to Visit  Medication Dose Route Frequency Provider Last Rate Last Dose  . DOBUTamine (DOBUTREX) 1,000 mcg/mL in dextrose 5% 250 mL infusion  30 mcg/kg/min Intravenous Continuous Mihai Croitoru, MD 145.3 mL/hr at 04/22/15 1300 30 mcg/kg/min at  04/22/15 1300    ROS Review of Systems  Constitutional: Negative for fever and chills.  HENT: Negative for congestion.   Eyes: Negative for visual disturbance.  Respiratory: Negative for cough and shortness of breath.   Cardiovascular: Negative for chest pain.  Gastrointestinal: Negative for nausea, abdominal pain and blood in stool.  Musculoskeletal: Positive for back pain and arthralgias.  Skin: Positive for rash and wound.  Allergic/Immunologic: Negative for immunocompromised state.  Hematological: Negative for adenopathy. Does not bruise/bleed easily.  Psychiatric/Behavioral: Negative for suicidal ideas and dysphoric mood.    Objective:  BP 115/74 mmHg  Pulse 93  Temp(Src) 98.5 F (36.9 C) (Oral)  Resp 16  Ht 4' 10" (1.473 m)  Wt 184 lb (83.462 kg)  BMI 38.47 kg/m2  SpO2 99%  LMP 09/13/2015  BP/Weight 11/05/2015 10/28/2015 09/30/4172  Systolic BP 081 448 185  Diastolic BP 74 83 72  Wt. (Lbs) 184 - 184  BMI 38.47 - 38.47   Physical Exam  Constitutional: She is oriented to person, place, and time. She appears well-developed and well-nourished. No distress.  HENT:  Head: Normocephalic and atraumatic.  Cardiovascular: Normal rate, regular rhythm, normal heart sounds and intact distal pulses.   Pulmonary/Chest: Effort normal. No respiratory distress. She has no wheezes. She has no rales. She exhibits no tenderness.  Abdominal: Soft. Bowel sounds are normal. She exhibits no distension and no mass. There is no tenderness. There is no rebound and no guarding.  Musculoskeletal: She exhibits no edema.  Neurological: She is alert and oriented to person, place, and time.  Skin: Skin is warm and dry. No rash noted.     Psychiatric: She has a normal mood and affect.   Lab Results  Component Value Date   HGBA1C 7.30 09/25/2015   CBG 205   Assessment & Plan:  Nakyah was seen today for back pain.  Diagnoses and all orders for this visit:  Uncontrolled diabetes mellitus  type 2 without complications, unspecified long term insulin use status (HCC) -     POCT glucose (manual entry)  Essential hypertension -     Basic Metabolic Panel  Chronic back pain -     Ambulatory referral to Pain Clinic  Bilateral hand numbness -     Ambulatory referral to Orthopedic Surgery  Cellulitis of abdominal wall -     mupirocin cream (BACTROBAN) 2 %; Apply 1 application topically 3 (three) times daily.   Follow-up: No Follow-up on file.   Boykin Nearing MD

## 2015-11-05 NOTE — Assessment & Plan Note (Signed)
Superficial cellulitis on abdomen Mupirocin ordered

## 2015-11-08 ENCOUNTER — Other Ambulatory Visit: Payer: Self-pay | Admitting: Internal Medicine

## 2015-11-08 ENCOUNTER — Other Ambulatory Visit: Payer: Self-pay | Admitting: Family Medicine

## 2015-11-11 ENCOUNTER — Telehealth: Payer: Self-pay | Admitting: Family Medicine

## 2015-11-11 ENCOUNTER — Telehealth: Payer: Self-pay | Admitting: *Deleted

## 2015-11-11 ENCOUNTER — Other Ambulatory Visit: Payer: Self-pay | Admitting: Family Medicine

## 2015-11-11 NOTE — Telephone Encounter (Signed)
Date of birth verified by pt  Lab results given  Pt verbalized understanding  Information given in Spanish

## 2015-11-11 NOTE — Telephone Encounter (Signed)
-----   Message from Boykin Nearing, MD sent at 11/05/2015 10:00 PM EST ----- Normal potassium on BMP

## 2015-11-11 NOTE — Telephone Encounter (Signed)
Patient called requesting medication refill for BP Medicine. verapamil (VERELAN PM Pelase follow up.

## 2015-11-12 MED FILL — VERAPAMIL 360 MG CAP PELLET: 360 | 30 days supply | Qty: 30 | Fill #0

## 2015-11-12 MED FILL — ATORVASTATIN 20 MG TABLET: 20 | 30 days supply | Qty: 30 | Fill #7

## 2015-11-12 MED FILL — MONTELUKAST SOD 10 MG TAB: 10 | 30 days supply | Qty: 30 | Fill #8

## 2015-11-12 MED FILL — cloNIDine HCL 0.2 MG TABS: 0.2 | 30 days supply | Qty: 90 | Fill #10

## 2015-11-12 NOTE — Telephone Encounter (Signed)
Pt has Refills at [facility]  Pt notified

## 2015-11-23 MED FILL — BD INSULIN SYR 1 ML 6MMX31G: 31G X 15/64 | 33 days supply | Qty: 100 | Fill #1

## 2015-11-23 MED FILL — ULTICARE PEN NDL 4MM 32G: 32G X 4 MM | 25 days supply | Qty: 100 | Fill #1

## 2015-11-23 MED FILL — ALBUTEROL 0.083% INHAL SOLN: (2.5 MG/3ML | 5 days supply | Qty: 90 | Fill #1

## 2015-11-26 MED FILL — LOSARTAN POTASSIUM 100 MG T: 100 | 30 days supply | Qty: 30 | Fill #3

## 2015-12-07 ENCOUNTER — Other Ambulatory Visit: Payer: Self-pay | Admitting: Family Medicine

## 2015-12-11 ENCOUNTER — Ambulatory Visit (INDEPENDENT_AMBULATORY_CARE_PROVIDER_SITE_OTHER): Payer: Medicaid Other | Admitting: Internal Medicine

## 2015-12-11 ENCOUNTER — Encounter: Payer: Self-pay | Admitting: Internal Medicine

## 2015-12-11 VITALS — BP 128/72 | Ht <= 58 in | Wt 182.6 lb

## 2015-12-11 DIAGNOSIS — R05 Cough: Secondary | ICD-10-CM

## 2015-12-11 DIAGNOSIS — R058 Other specified cough: Secondary | ICD-10-CM

## 2015-12-11 DIAGNOSIS — J45998 Other asthma: Secondary | ICD-10-CM | POA: Diagnosis not present

## 2015-12-11 DIAGNOSIS — I10 Essential (primary) hypertension: Secondary | ICD-10-CM | POA: Diagnosis not present

## 2015-12-11 MED FILL — VERAPAMIL 360 MG CAP PELLET: 360 | 30 days supply | Qty: 30 | Fill #1

## 2015-12-11 MED FILL — cloNIDine HCL 0.2 MG TABS: 0.2 | 30 days supply | Qty: 90 | Fill #11

## 2015-12-11 MED FILL — ATORVASTATIN 20 MG TABLET: 20 | 30 days supply | Qty: 30 | Fill #8

## 2015-12-11 NOTE — Progress Notes (Signed)
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  Onset was childhood but continued to have "good days and bad days" until 2010 and no good days since then even p pred started and no better since arrived in [place] summer 2015 with no evidence of any airflow obst by pfts 11/03/14    History of Present Illness  09/04/2014 1st [facility] office visit/ Raven Turner   Chief Complaint  Patient presents with  . Advice Only    Referred for chronic bronchitis. Recently moved from [place].   Every day's about the same since 2012 admitted to [facility] every month but not since arrival in us/   maintaining on prednisone ? Dose plus theoph/ brethine/ advair 500 and nebs "all day long"  But interestingly less symptoms nocturnally. Cough is mostly dry assoc with hoarseness  rec Stop the advair, the brethine  and the theophylline Start dulera 100 Take 2 puffs first thing in am and then another 2 puffs about 12 hours later.  Continue prilosec 20 mg Take 30- 60 min before your first and last meals of the day  GERD diet  Only use your albuterol nebulizer  If needed  If you find you don't need the nebulizer as much, then we can start reducing your prednisone by 10 mg a week   09/16/2014 f/u ov/Raven Turner re: psueudoasthma vs asthma back on theoph/brethine and pred 40  Chief Complaint  Patient presents with  . HFU    Pt states that her breathing has improved some, but not back to baseline. SHe is still coughing up yellow sputum.    using neb saba avg twice daily, feels theoph helps with "congestion" but able to do adls ok   >d/c theophylline/breathine    11/03/2014 f/u ov/Raven Turner re: dtca asthma/ no rx today/ pred still at 30 mg  Chief Complaint    Patient presents with  . Follow-up    PFT done today. Pt states that her cough has resolved. Her breathing is some better. No new co's today.   still badly misunderstanding how to take her meds / unable to doing meaningful or accurate med rec  >>decrease pred 20    12/15/2014 f/u ov/Raven Turner re: chronic asthma/ pseudoasthma / brought med calendar in [place] condition  Chief Complaint  Patient presents with  . Follow-up    Pt states still feeling SOB especially with movement. Denies fever, cough, or chest tightness   on prednisone 20 mg  Plus neb 3 tid including am of ov due to variable sob day >>noct  rec Plan A = automatic = dulera 100 Take 2 puffs first thing in am and then another 2 puffs about 12 hours later.  Work on inhaler technique:  relax and gently blow all the way out then take a nice smooth deep breath back in, triggering the inhaler at same time you start breathing in.  Hold for up to 5 seconds if you can.  Rinse and gargle with water when done Plan B = backup Only use your albuterol [place] as a rescue medication  Plan C = neb albuterol, only use it if try B first and it doesn't work Continue  prednisone to  20 mg daily    07/13/2015  f/u ov/Raven Turner re: asthma vs vcd  On dulera 200 and pred 58m daily  Chief Complaint  Patient presents with  . Follow-up    Pt states breathing is "so, so". She had to increase the Dulera from 100 mcg to 200 mcg approx 2 wks ago, and she is using albuterol inhaler 3 x per day on average. She has been using neb every night.   Over using saba the same even on higher dose of dulera but more hoarse rec Resume the dulera 100 Take 2 puffs first thing in am and then another 2 puffs about 12 hours later.  Only use your albuterol (proair) as a rescue medication  Only use the nebulized albuterol if you use the proair and it fails to relieve breathing difficulty Use the tylenol #3 as per med calendar to suppress cough    12/11/2015  f/u ov/Raven Turner re:  Chronic  asthma/ dtc / no med calendar/ no rescue/ not using dulera as rec 1st thing in am  Chief Complaint  Patient presents with  . Follow-up    Breathing is unchanged. She is still wheezing some.   waking up frequently with sensation can't breath at least once a night varies bewteen 1 am and 4 am needs neb to get back to sleep  No obvious day to day or daytime variability or assoc excess/ purulent sputum or mucus plugs or hemoptysis or cp or chest tightness, subjective wheeze or overt sinus or hb symptoms. No unusual exp hx or h/o childhood pna/ asthma or knowledge of premature birth.  Also denies any obvious fluctuation of symptoms with weather or environmental changes or other aggravating or alleviating factors except as outlined above   Current Medications, Allergies, Complete Past Medical History, Past Surgical History, Family History, and Social History were reviewed in [employer].  ROS  The following are not active complaints unless bolded sore throat, dysphagia, dental problems, itching, sneezing,  nasal congestion or excess/ purulent secretions, ear ache,   fever, chills, sweats, unintended wt loss, classically pleuritic or exertional cp,  orthopnea pnd or leg swelling, presyncope, palpitations, abdominal pain, anorexia, nausea, vomiting, diarrhea  or change in bowel or bladder habits, change in stools or urine, dysuria,hematuria,  rash, arthralgias, visual complaints, headache, numbness, weakness or ataxia or problems with walking or coordination,  change in mood/affect or memory.                  Objective:   Physical Exam   Mildly hoarse  very cushingnoid amb hispanic female nad /tearful when addressing compliance issues  01/12/2015  186 > 177 02/17/2015 >  03/31/2015  181 >180 >180 05/12/2015 > 07/13/2015   182 > 12/11/2015    182    Vital signs reviewed       HEENT: nl dentition, turbinates, and orophanx. Nl external ear canals without cough  reflex   NECK :  without JVD/Nodes/TM/ nl carotid upstrokes bilaterally   LUNGS: no acc muscle use, clear to A and P bilaterally without cough on insp or exp maneuvers -   CV:  RRR  no s3 or murmur or increase in P2, no edema   ABD:  soft and nontender with nl excursion in the supine position. No bruits or organomegaly, bowel sounds nl  MS:  warm without deformities, calf tenderness, cyanosis or clubbing Back brace  SKIN: warm and dry without lesions  Assessment & Plan:

## 2015-12-11 NOTE — Patient Instructions (Addendum)
Automatically take  the dulera 100 Take 2 puffs first thing in am and then another 2 puffs about 12 hours later.   Only use your albuterol (proair) as a rescue medication to be used if you can't catch your breath by resting or doing a relaxed purse lip breathing pattern.  - The less you use it, the better it will work when you need it. - Ok to use up to 2 puffs  every 4 hours if you must but call for immediate appointment if use goes up over your usual need - Don't leave home without it !!  (think of it like the spare tire for your car)   Only use albuterol nebulizer after you first try the Proair if it fails to work   Avoid mint products including gum  Work on inhaler technique:  relax and gently blow all the way out then take a nice smooth deep breath back in, triggering the inhaler at same time you start breathing in.  Hold for up to 5 seconds if you can. Blow out thru nose. Rinse and gargle with water when done  For drainage / throat tickle try take CHLORPHENIRAMINE  4 mg - two @  Bedtime  Please schedule a follow up office visit in 6 weeks, call sooner if needed - to see Tammy with all meds to check accuracy of the med list          Tomar automticamente la dulera 100 Tome 2 bocanadas primera vez en la maana y luego otra 2 bocanadas aproximadamente 12 horas ms tarde.  Utilice solamente su albuterol (proair) como una medicacin del rescate a ser utilizado si usted no puede coger su respiracin descansando o haciendo un patrn de respiracin relajado del labio del monedero. - Cuanto menos lo utilice, mejor funcionar cuando lo necesite. - Ok para usar hasta 2 bocanadas cada 4 horas si es necesario, pero llamar para una cita inmediata si el uso sube por encima de su necesidad habitual - No salgas de casa sin ella! (Piense en l como la rueda de repuesto para su coche)  Slo utilice nebulizador de albuterol despus de Retail buyer si no funciona  Evite los productos de menta  incluyendo goma  Trabaje en la tcnica del inhalador: reljese y suavemente soplar todo el camino hacia fuera entonces toma una respiracin profunda lisa agradable detrs adentro, activando el inhalador al mismo tiempo usted comienza a Marketing executive. Sostngase por hasta 5 segundos si usted puede. Soplar a travs de Mudlogger. Enjuague y haga grgaras con agua cuando termine  Para el drenaje / garganta cosquillas trate de tomar CHLORPHENIRAMINE 4 mg - dos @ Bedtime  Por favor, programe una visita de oficina de seguimiento en 6 semanas, llame antes si es necesario - para ver Tammy con todos los medicamentos para verificar la exactitud de la lista de medicamentos

## 2015-12-12 NOTE — Assessment & Plan Note (Signed)
For reasons that may related to vascular permability and nitric oxide pathways but not elevated  bradykinin levels (as seen with  ACEi use) losartan in the generic form has been reported now from mulitple sources  to cause a similar pattern of non-specific  upper airway symptoms as seen with acei.   This has not been reported with exposure to the other ARB's to date, so may need to consider  either generic diovan or avapro if ARB needed or use an alternative class altogether.    See:  Lelon Frohlich Allergy Asthma Immunol  2008: 101: p 446-950

## 2015-12-12 NOTE — Assessment & Plan Note (Signed)
-   Trial off advair      09/05/2014 >>>  - trial off theoph 09/17/2014 and added flutter  - rec reduce dulera to 100 2 bid 07/13/2015    - added chlorpheniramine 4 mg 2 at hs for noct "wheeze"  12/11/2015 >>>  Consider change cozar to alternative arb - (see separate a/p)

## 2015-12-12 NOTE — Assessment & Plan Note (Signed)
Complicated by HBP/ Hyperlipidemia/ DM   Body mass index is 38.17  - no improvement   Lab Results  Component Value Date   TSH 2.626 09/08/2014     Contributing to gerd tendency/ doe/reviewed the need and the process to achieve and maintain neg calorie balance > defer f/u primary care including intermittently monitoring thyroid status

## 2015-12-12 NOTE — Assessment & Plan Note (Addendum)
09/05/2014  try dulera 100 2bid and off advair   - 09/16/2014 off theoph and brethine    - 11/03/14  PFTs p am dulera 100 on 30 mg prednisone  FEV1 1.56(73%) ratio 85 =  No obst, including fef 25-75%, only abn is reduction in ERV -med calendar 11/17/2014 , 02/17/2015  - 12/2014 changed to physiologic steroids  - 01/12/2015 p extensive coaching HFA effectiveness = 75%  - spirometry 03/31/15 1.48 (62%) ratio 82  p am dulera and while symptomatic requesting saba  05/12/2015 med calendar , redone 10/13/2015> did not bring 12/11/2015  - 12/11/2015  extensive coaching HFA effectiveness =     75% from as baseline of < 50 (ti   too short)    DDX of  difficult airways management almost all start with A and  include Adherence, Ace Inhibitors, Acid Reflux, Active Sinus Disease, Alpha 1 Antitripsin deficiency, Anxiety masquerading as Airways dz,  ABPA,  Allergy(esp in young), Aspiration (esp in elderly), Adverse effects of meds,  Active smokers, A bunch of PE's (a small clot burden can't cause this syndrome unless there is already severe underlying pulm or vascular dz with poor reserve) plus two Bs  = Bronchiectasis and Beta blocker use..and one C= CHF  In this case Adherence is the biggest issue and starts with  inability to use HFA effectively and also  understand that SABA treats the symptoms but doesn't get to the underlying problem (inflammation).  I used  the analogy of putting steroid cream on a rash to help explain the meaning of topical therapy and the need to get the drug to the target tissue.    - The proper method of use, as well as anticipated side effects, of a metered-dose inhaler are discussed and demonstrated to the patient. Improved effectiveness after extensive coaching during this visit to a level of approximately 75 % from a baseline of 50 %  - thru interpreter reviewed med calendar including instructions to bring it with her to each ov (every provider) to avoid confusion re her care  ? Acid (or non-acid)  GERD > always difficult to exclude as up to 75% of pts in some series report no assoc GI/ Heartburn symptoms> rec continue max (24h)  acid suppression and diet restrictions/ reviewed    - emphasized no mint gum  ? Anxiety > usually at the bottom of this list of usual suspects but should be much higher on this pt's based on H and P and note already on psychotropics .   ? Allergy > continue singulair and min prednisone to prevent adrenal crisis/ no more   I had an extended discussion with the patient thru interpeter reviewing all relevant studies completed to date and  lasting 15 to 20 minutes of a 25 minute visit    Each maintenance medication was reviewed in detail including most importantly the difference between maintenance and prns and under what circumstances the prns are to be triggered using an action plan format that is not reflected in the computer generated alphabetically organized AVS but trather by a customized med calendar that reflects the AVS meds with confirmed 100% correlation.   Please see instructions for details which were reviewed in writing and the patient given a copy highlighting the part that I personally wrote and discussed at today's ov.

## 2015-12-18 HISTORY — PX: HAND SURGERY: SHX662

## 2015-12-21 ENCOUNTER — Ambulatory Visit: Payer: Medicaid Other | Attending: Family Medicine | Admitting: Family Medicine

## 2015-12-21 ENCOUNTER — Encounter: Payer: Self-pay | Admitting: Family Medicine

## 2015-12-21 VITALS — BP 126/76 | HR 80 | Temp 98.4°F | Resp 16 | Ht <= 58 in | Wt 184.0 lb

## 2015-12-21 DIAGNOSIS — Z79899 Other long term (current) drug therapy: Secondary | ICD-10-CM | POA: Diagnosis not present

## 2015-12-21 DIAGNOSIS — Z9889 Other specified postprocedural states: Secondary | ICD-10-CM | POA: Insufficient documentation

## 2015-12-21 DIAGNOSIS — G8929 Other chronic pain: Secondary | ICD-10-CM | POA: Insufficient documentation

## 2015-12-21 DIAGNOSIS — M549 Dorsalgia, unspecified: Secondary | ICD-10-CM | POA: Diagnosis not present

## 2015-12-21 DIAGNOSIS — Z7982 Long term (current) use of aspirin: Secondary | ICD-10-CM | POA: Insufficient documentation

## 2015-12-21 DIAGNOSIS — Z794 Long term (current) use of insulin: Secondary | ICD-10-CM | POA: Diagnosis not present

## 2015-12-21 DIAGNOSIS — E1165 Type 2 diabetes mellitus with hyperglycemia: Secondary | ICD-10-CM

## 2015-12-21 DIAGNOSIS — B351 Tinea unguium: Secondary | ICD-10-CM | POA: Diagnosis not present

## 2015-12-21 DIAGNOSIS — M79676 Pain in unspecified toe(s): Secondary | ICD-10-CM

## 2015-12-21 DIAGNOSIS — M7989 Other specified soft tissue disorders: Secondary | ICD-10-CM | POA: Diagnosis not present

## 2015-12-21 DIAGNOSIS — G5603 Carpal tunnel syndrome, bilateral upper limbs: Secondary | ICD-10-CM | POA: Diagnosis not present

## 2015-12-21 DIAGNOSIS — IMO0001 Reserved for inherently not codable concepts without codable children: Secondary | ICD-10-CM

## 2015-12-21 LAB — COMPLETE METABOLIC PANEL WITH GFR
ALT: 34 U/L — ABNORMAL HIGH (ref 6–29)
AST: 27 U/L (ref 10–30)
Albumin: 4.1 g/dL (ref 3.6–5.1)
Alkaline Phosphatase: 115 U/L (ref 33–115)
BUN: 11 mg/dL (ref 7–25)
CO2: 28 mmol/L (ref 20–31)
Calcium: 9.8 mg/dL (ref 8.6–10.2)
Chloride: 102 mmol/L (ref 98–110)
Creat: 0.54 mg/dL (ref 0.50–1.10)
GFR, Est African American: >89 mL/min (ref >=60)
GFR, Est Non African American: >89 mL/min (ref >=60)
Glucose, Bld: 149 mg/dL — ABNORMAL HIGH (ref 65–99)
Potassium: 4.2 mmol/L (ref 3.5–5.3)
Sodium: 139 mmol/L (ref 135–146)
Total Bilirubin: 0.2 mg/dL (ref 0.2–1.2)
Total Protein: 7.7 g/dL (ref 6.1–8.1)

## 2015-12-21 LAB — GLUCOSE, POCT (MANUAL RESULT ENTRY): POC Glucose: 178 mg/dl — AB (ref 70–99)

## 2015-12-21 LAB — POCT GLYCOSYLATED HEMOGLOBIN (HGB A1C): Hemoglobin A1C: 8

## 2015-12-21 MED ORDER — TERBINAFINE HCL 250 MG PO TABS: 250.0000 mg | ORAL_TABLET | Freq: Every day | ORAL | Status: DC

## 2015-12-21 MED ORDER — INSULIN ASPART 100 UNIT/ML ~~LOC~~ SOLN: SUBCUTANEOUS | Status: DC

## 2015-12-21 MED ORDER — METFORMIN HCL 1000 MG PO TABS
1000.0000 mg | ORAL_TABLET | Freq: Two times a day (BID) | ORAL | Status: DC
Start: 2015-12-21 — End: 2017-01-05

## 2015-12-21 MED ORDER — INSULIN GLARGINE 100 UNIT/ML SOLOSTAR PEN: 100.0000 [IU] | PEN_INJECTOR | Freq: Every day | SUBCUTANEOUS | Status: DC

## 2015-12-21 NOTE — Progress Notes (Signed)
F/U DM  Glucose running 110-300 Stated Insulin was change Humalog 90 unit at lunch time  Humalog 100 at dinner, Lantus 100 unit Qday  No tobacco user  No suicidal thoughts in the past two weeks

## 2015-12-21 NOTE — Progress Notes (Signed)
Subjective:  Patient ID: Raven Turner, female    DOB: 1970/10/29  Age: 45 y.o. MRN: [id number]  CC: Diabetes  Spanish interpreter used  HPI Raven Turner presents for   1.CHRONIC DIABETES  Disease Monitoring  Blood Sugar Ranges: 110-300  Polyuria: no   Visual problems: no   Medication Compliance: yes  Medication Side Effects  Hypoglycemia: no   Preventitive Health Care  Eye Exam: done in 07/2015   Foot Exam: done today   Diet pattern: low carb   Exercise: minimal  2. Carpal tunnel: she is s/p R wrist carpal tunnel surgery. Having some swelling and bruising in her hand. Awaiting pain management appt.    Past Surgical History  Procedure Laterality Date  . Cataract extraction Bilateral 2014  . Vaginal hysterectomy     Social History  Substance Use Topics  . Smoking status: Never Smoker   . Smokeless tobacco: Never Used  . Alcohol Use: No   Outpatient Prescriptions Prior to Visit  Medication Sig Dispense Refill  . acetaminophen-codeine (TYLENOL #3) 300-30 MG tablet TAKE 1 TO 2 TABLETS BY MOUTH AT BEDTIME AS NEEDED FOR MODERATE PAIN (Patient taking differently: TAKE 1 TO 2 TABLETS BY MOUTH AT BEDTIME AS NEEDED FOR SEVERE PAIN OR COUGH) 60 tablet 2  . albuterol (PROVENTIL HFA;VENTOLIN HFA) 108 (90 Base) MCG/ACT inhaler Inhale 2 puffs into the lungs every 4 (four) hours as needed for wheezing or shortness of breath (((PLAN B))).    Marland Kitchen albuterol (PROVENTIL) (2.5 MG/3ML) 0.083% nebulizer solution Take 3 mLs (2.5 mg total) by nebulization every 4 (four) hours as needed for wheezing or shortness of breath (plan C). 75 mL 3  . aspirin 325 MG tablet Take 325 mg by mouth every morning.    Marland Kitchen atorvastatin (LIPITOR) 20 MG tablet Take 20 mg by mouth daily at 6 PM.    . Blood Glucose Monitoring Suppl (ACCU-CHEK AVIVA PLUS) W/DEVICE KIT Used as directed 1 kit 0  . cholecalciferol (VITAMIN D) 1000 UNITS tablet Take 1,000 Units by mouth every morning.    . clonazePAM  (KLONOPIN) 0.5 MG tablet Take 1 tablet (0.5 mg total) by mouth 2 (two) times daily as needed (trouble sleeping and anxiety). 30 tablet 2  . cloNIDine (CATAPRES) 0.2 MG tablet Take 1 tablet (0.2 mg total) by mouth 3 (three) times daily. 90 tablet 11  . dextromethorphan-guaiFENesin (MUCINEX DM) 30-600 MG 12hr tablet Take 1 tablet by mouth 2 (two) times daily as needed (with flutter valve for cough and congestion).    . ferrous sulfate 325 (65 FE) MG tablet Take 1 tablet (325 mg total) by mouth daily with breakfast. 30 tablet 3  . FOLIC ACID PO Take 1 tablet by mouth every morning.    . furosemide (LASIX) 40 MG tablet Take 1 tablet (40 mg total) by mouth every morning. 30 tablet 2  . gabapentin (NEURONTIN) 400 MG capsule TAKE 2 CAPSULES BY MOUTH TWICE DAILY 120 capsule 0  . glucose blood (RELION GLUCOSE TEST STRIPS) test strip 1 each by Other route 4 (four) times daily. ICD code E11.65 400 each 3  . insulin aspart (NOVOLOG) 100 UNIT/ML injection Inject 80 Units into the skin 3 (three) times daily before meals. (Patient taking differently: Inject 90 Units into the skin 3 (three) times daily before meals. ) 3 vial 3  . Insulin Glargine (LANTUS SOLOSTAR) 100 UNIT/ML Solostar Pen Inject 96 Units into the skin at bedtime. (Patient taking differently: Inject 100 Units into the skin at bedtime. )  5 pen 11  . Insulin Pen Needle (B-D ULTRAFINE III SHORT PEN) 31G X 8 MM MISC 1 application by Does not apply route daily. 100 each 3  . Insulin Syringe-Needle U-100 (BD INSULIN SYRINGE ULTRAFINE) 31G X 15/64" 1 ML MISC 1 each by Does not apply route 3 (three) times daily. 300 each 3  . Lancet Devices (ACCU-CHEK SOFTCLIX) lancets Use as instructed 1 each 0  . losartan (COZAAR) 100 MG tablet Take 1 tablet (100 mg total) by mouth every morning. 30 tablet 3  . metFORMIN (GLUCOPHAGE) 500 MG tablet Take 1 tablet (500 mg total) by mouth 2 (two) times daily with a meal. (Patient taking differently: Take 1,000 mg by mouth 2  (two) times daily with a meal. ) 60 tablet 11  . methocarbamol (ROBAXIN) 750 MG tablet Take 1 tablet (750 mg total) by mouth every 8 (eight) hours as needed for muscle spasms. 90 tablet 2  . mometasone-formoterol (DULERA) 100-5 MCG/ACT AERO Take 2 puffs first thing in am and then another 2 puffs about 12 hours later. 1 Inhaler 11  . montelukast (SINGULAIR) 10 MG tablet Take 1 tablet (10 mg total) by mouth at bedtime. 30 tablet 11  . mupirocin cream (BACTROBAN) 2 % Apply 1 application topically 3 (three) times daily. 15 g 0  . omeprazole (PRILOSEC) 20 MG capsule Take 1 capsule (20 mg total) by mouth daily before breakfast. 90 capsule 1  . Oyster Shell Calcium 500 MG TABS Take 0.4 tablets (500 mg total) by mouth 2 (two) times daily after a meal. 60 tablet 11  . PARoxetine (PAXIL) 20 MG tablet Take 1 tablet (20 mg total) by mouth daily. (Patient taking differently: Take 20 mg by mouth every morning. ) 30 tablet 5  . predniSONE (DELTASONE) 1 MG tablet Take 4 mg by mouth daily with breakfast.     . ranitidine (ZANTAC) 150 MG tablet TAKE 2 TABLETS BY MOUTH EVERY NIGHT AT BEDTIME 60 tablet 2  . ReliOn Ultra Thin Lancets MISC 1 each by Does not apply route 4 (four) times daily. ICD code E11.65 400 each 3  . Respiratory Therapy Supplies (FLUTTER) DEVI Use as directed 1 each 0  . ULTICARE MICRO PEN NEEDLES 32G X 4 MM MISC USE AS DIRECTED 100 each 2  . verapamil (VERELAN PM) 360 MG 24 hr capsule Take 1 capsule (360 mg total) by mouth every morning. 30 capsule 11   Facility-Administered Medications Prior to Visit  Medication Dose Route Frequency Provider Last Rate Last Dose  . DOBUTamine (DOBUTREX) 1,000 mcg/mL in dextrose 5% 250 mL infusion  30 mcg/kg/min Intravenous Continuous Mihai Croitoru, MD 145.3 mL/hr at 04/22/15 1300 30 mcg/kg/min at 04/22/15 1300    ROS Review of Systems  Constitutional: Negative for fever and chills.  HENT: Negative for congestion.   Eyes: Negative for visual disturbance.    Respiratory: Negative for cough and shortness of breath.   Cardiovascular: Negative for chest pain.  Gastrointestinal: Negative for nausea, abdominal pain and blood in stool.  Musculoskeletal: Positive for back pain and arthralgias.  Skin: Negative for rash and wound.  Allergic/Immunologic: Negative for immunocompromised state.  Hematological: Negative for adenopathy. Does not bruise/bleed easily.  Psychiatric/Behavioral: Negative for suicidal ideas and dysphoric mood.    Objective:  BP 126/76 mmHg  Pulse 80  Temp(Src) 98.4 F (36.9 C) (Oral)  Resp 16  Ht '4\' 10"'$  (1.473 m)  Wt 184 lb (83.462 kg)  BMI 38.47 kg/m2  SpO2 98%  LMP 09/13/2015  BP/Weight 12/21/2015 12/11/2015 11/25/4006  Systolic BP 676 195 093  Diastolic BP 76 72 74  Wt. (Lbs) 184 182.6 184  BMI 38.47 38.17 38.47   Wt Readings from Last 3 Encounters:  12/21/15 184 lb (83.462 kg)  12/11/15 182 lb 9.6 oz (82.827 kg)  11/05/15 184 lb (83.462 kg)    Physical Exam  Constitutional: She is oriented to person, place, and time. She appears well-developed and well-nourished. No distress.  HENT:  Head: Normocephalic and atraumatic.  Cardiovascular: Normal rate, regular rhythm, normal heart sounds and intact distal pulses.   Pulmonary/Chest: Effort normal and breath sounds normal. No respiratory distress. She has no wheezes. She has no rales. She exhibits no tenderness.  Abdominal: Soft. Bowel sounds are normal. She exhibits no distension and no mass. There is no tenderness. There is no rebound and no guarding.  Musculoskeletal: She exhibits no edema.       Hands: Neurological: She is alert and oriented to person, place, and time.  Skin: Skin is warm and dry. No rash noted.  Psychiatric: She has a normal mood and affect.   Lab Results  Component Value Date   HGBA1C 8.0 12/21/2015   Last A1c 7.3   CBG 178  Assessment & Plan:  Dickie was seen today for diabetes.  Diagnoses and all orders for this  visit:  Uncontrolled diabetes mellitus type 2 without complications, unspecified long term insulin use status (HCC) -     POCT glycosylated hemoglobin (Hb A1C) -     POCT glucose (manual entry) -     metFORMIN (GLUCOPHAGE) 1000 MG tablet; Take 1 tablet (1,000 mg total) by mouth 2 (two) times daily with a meal. -     insulin aspart (NOVOLOG) 100 UNIT/ML injection; 90 U at breakfast, 90 U at lunch, 100 U at evening meal -     Insulin Glargine (LANTUS SOLOSTAR) 100 UNIT/ML Solostar Pen; Inject 100 Units into the skin at bedtime.  Pain due to onychomycosis of toenail -     terbinafine (LAMISIL) 250 MG tablet; Take 1 tablet (250 mg total) by mouth daily. -     COMPLETE METABOLIC PANEL WITH GFR  Chronic back pain -     Ambulatory referral to Pain Clinic  Bilateral carpal tunnel syndrome -     Ambulatory referral to Pain Clinic    No orders of the defined types were placed in this encounter.    Follow-up: No Follow-up on file.   Boykin Nearing MD

## 2015-12-21 NOTE — Patient Instructions (Addendum)
Charlyn was seen today for diabetes.  Diagnoses and all orders for this visit:  Uncontrolled diabetes mellitus type 2 without complications, unspecified long term insulin use status (HCC) -     POCT glycosylated hemoglobin (Hb A1C) -     POCT glucose (manual entry) -     metFORMIN (GLUCOPHAGE) 1000 MG tablet; Take 1 tablet (1,000 mg total) by mouth 2 (two) times daily with a meal. -     insulin aspart (NOVOLOG) 100 UNIT/ML injection; 90 U at breakfast, 90 U at lunch, 100 U at evening meal -     Insulin Glargine (LANTUS SOLOSTAR) 100 UNIT/ML Solostar Pen; Inject 100 Units into the skin at bedtime.  Pain due to onychomycosis of toenail -     terbinafine (LAMISIL) 250 MG tablet; Take 1 tablet (250 mg total) by mouth daily. -     COMPLETE METABOLIC PANEL WITH GFR  Chronic back pain -     Ambulatory referral to Pain Clinic  Bilateral carpal tunnel syndrome -     Ambulatory referral to Pain Clinic   F/u in 4 months for diabetes sooner if needed   Dr. Adrian Blackwater

## 2015-12-22 NOTE — Assessment & Plan Note (Signed)
Has NASH with normal LFTs x 2  lamsil with close LFT monitoring q 4-6 weeks while on treatment

## 2015-12-22 NOTE — Addendum Note (Signed)
Addended by: Boykin Nearing on: 12/22/2015 08:37 AM   Modules accepted: Orders

## 2015-12-22 NOTE — Assessment & Plan Note (Addendum)
Followed by endocrinology Slight decline with rise in A1c Patient insulin recently adjusted   Continue current regimen

## 2015-12-23 ENCOUNTER — Telehealth: Payer: Self-pay | Admitting: *Deleted

## 2015-12-23 MED FILL — MONTELUKAST SOD 10 MG TAB: 10 | 30 days supply | Qty: 30 | Fill #9

## 2015-12-23 NOTE — Telephone Encounter (Signed)
-----   Message from Boykin Nearing, MD sent at 12/22/2015  8:35 AM EDT ----- LFTs normal Continue lamisil daily for 12 weeks Repeat CMP in 4-6 weeks for LFT monitoring ordered

## 2015-12-23 NOTE — Telephone Encounter (Signed)
LVM to return call.

## 2015-12-28 ENCOUNTER — Other Ambulatory Visit: Payer: Self-pay | Admitting: Family Medicine

## 2015-12-28 MED FILL — METHOCARBAMOL 750 MG TABLET: 750 | 30 days supply | Qty: 90 | Fill #0

## 2015-12-28 MED FILL — ULTICARE PEN NDL 4MM 32G: 32G X 4 MM | 25 days supply | Qty: 100 | Fill #2 | Status: TO

## 2015-12-28 MED FILL — LOSARTAN POTASSIUM 100 MG T: 100 | 30 days supply | Qty: 30 | Fill #0

## 2015-12-28 MED FILL — PARoxetine HCL 20 MG TABS: 20 | 30 days supply | Qty: 30 | Fill #2

## 2015-12-28 MED FILL — OYSTER SHELL CAL 500 MG TAB: 500 | 30 days supply | Qty: 60 | Fill #1

## 2015-12-28 MED FILL — ALBUTEROL 0.083% INHAL SOLN: (2.5 MG/3ML | 5 days supply | Qty: 90 | Fill #2 | Status: TO

## 2016-01-02 ENCOUNTER — Encounter (HOSPITAL_COMMUNITY): Payer: Self-pay | Admitting: *Deleted

## 2016-01-02 DIAGNOSIS — Z7951 Long term (current) use of inhaled steroids: Secondary | ICD-10-CM | POA: Diagnosis not present

## 2016-01-02 DIAGNOSIS — I10 Essential (primary) hypertension: Secondary | ICD-10-CM | POA: Insufficient documentation

## 2016-01-02 DIAGNOSIS — Z90710 Acquired absence of both cervix and uterus: Secondary | ICD-10-CM | POA: Diagnosis not present

## 2016-01-02 DIAGNOSIS — D649 Anemia, unspecified: Secondary | ICD-10-CM | POA: Insufficient documentation

## 2016-01-02 DIAGNOSIS — R1010 Upper abdominal pain, unspecified: Secondary | ICD-10-CM | POA: Diagnosis present

## 2016-01-02 DIAGNOSIS — Z872 Personal history of diseases of the skin and subcutaneous tissue: Secondary | ICD-10-CM | POA: Insufficient documentation

## 2016-01-02 DIAGNOSIS — Z7984 Long term (current) use of oral hypoglycemic drugs: Secondary | ICD-10-CM | POA: Diagnosis not present

## 2016-01-02 DIAGNOSIS — F329 Major depressive disorder, single episode, unspecified: Secondary | ICD-10-CM | POA: Diagnosis not present

## 2016-01-02 DIAGNOSIS — Z7982 Long term (current) use of aspirin: Secondary | ICD-10-CM | POA: Diagnosis not present

## 2016-01-02 DIAGNOSIS — R1012 Left upper quadrant pain: Secondary | ICD-10-CM | POA: Insufficient documentation

## 2016-01-02 DIAGNOSIS — E559 Vitamin D deficiency, unspecified: Secondary | ICD-10-CM | POA: Diagnosis not present

## 2016-01-02 DIAGNOSIS — R1013 Epigastric pain: Secondary | ICD-10-CM | POA: Insufficient documentation

## 2016-01-02 DIAGNOSIS — Z8614 Personal history of Methicillin resistant Staphylococcus aureus infection: Secondary | ICD-10-CM | POA: Insufficient documentation

## 2016-01-02 DIAGNOSIS — M8580 Other specified disorders of bone density and structure, unspecified site: Secondary | ICD-10-CM | POA: Diagnosis not present

## 2016-01-02 DIAGNOSIS — R1011 Right upper quadrant pain: Secondary | ICD-10-CM | POA: Insufficient documentation

## 2016-01-02 DIAGNOSIS — Z8669 Personal history of other diseases of the nervous system and sense organs: Secondary | ICD-10-CM | POA: Diagnosis not present

## 2016-01-02 DIAGNOSIS — Z794 Long term (current) use of insulin: Secondary | ICD-10-CM | POA: Insufficient documentation

## 2016-01-02 DIAGNOSIS — Z8781 Personal history of (healed) traumatic fracture: Secondary | ICD-10-CM | POA: Insufficient documentation

## 2016-01-02 DIAGNOSIS — Z7952 Long term (current) use of systemic steroids: Secondary | ICD-10-CM | POA: Insufficient documentation

## 2016-01-02 DIAGNOSIS — F411 Generalized anxiety disorder: Secondary | ICD-10-CM | POA: Diagnosis not present

## 2016-01-02 DIAGNOSIS — R112 Nausea with vomiting, unspecified: Secondary | ICD-10-CM | POA: Insufficient documentation

## 2016-01-02 DIAGNOSIS — Z79899 Other long term (current) drug therapy: Secondary | ICD-10-CM | POA: Diagnosis not present

## 2016-01-02 DIAGNOSIS — J449 Chronic obstructive pulmonary disease, unspecified: Secondary | ICD-10-CM | POA: Diagnosis not present

## 2016-01-02 DIAGNOSIS — Z3202 Encounter for pregnancy test, result negative: Secondary | ICD-10-CM | POA: Insufficient documentation

## 2016-01-02 DIAGNOSIS — E1140 Type 2 diabetes mellitus with diabetic neuropathy, unspecified: Secondary | ICD-10-CM | POA: Insufficient documentation

## 2016-01-02 LAB — URINALYSIS, ROUTINE W REFLEX MICROSCOPIC
Bilirubin Urine: NEGATIVE
Glucose, UA: NEGATIVE mg/dL
Ketones, ur: NEGATIVE mg/dL
Nitrite: NEGATIVE
Protein, ur: 30 mg/dL — AB
Specific Gravity, Urine: 1.026 (ref 1.005–1.030)
pH: 5 (ref 5.0–8.0)

## 2016-01-02 LAB — LIPASE, BLOOD: Lipase: 34 U/L (ref 11–51)

## 2016-01-02 LAB — COMPREHENSIVE METABOLIC PANEL
ALT: 54 U/L (ref 14–54)
AST: 48 U/L — ABNORMAL HIGH (ref 15–41)
Albumin: 3.5 g/dL (ref 3.5–5.0)
Alkaline Phosphatase: 122 U/L (ref 38–126)
Anion gap: 13 (ref 5–15)
BUN: 10 mg/dL (ref 6–20)
CO2: 23 mmol/L (ref 22–32)
Calcium: 9.6 mg/dL (ref 8.9–10.3)
Chloride: 104 mmol/L (ref 101–111)
Creatinine, Ser: 0.7 mg/dL (ref 0.44–1.00)
GFR calc Af Amer: >60 mL/min (ref >60)
GFR calc non Af Amer: >60 mL/min (ref >60)
Glucose, Bld: 118 mg/dL — ABNORMAL HIGH (ref 65–99)
Potassium: 3.9 mmol/L (ref 3.5–5.1)
Sodium: 140 mmol/L (ref 135–145)
Total Bilirubin: 0.3 mg/dL (ref 0.3–1.2)
Total Protein: 8.4 g/dL — ABNORMAL HIGH (ref 6.5–8.1)

## 2016-01-02 LAB — CBC
HCT: 40.2 % (ref 36.0–46.0)
Hemoglobin: 12.8 g/dL (ref 12.0–15.0)
MCH: 25.8 pg — ABNORMAL LOW (ref 26.0–34.0)
MCHC: 31.8 g/dL (ref 30.0–36.0)
MCV: 80.9 fL (ref 78.0–100.0)
Platelets: 410 10*3/uL — ABNORMAL HIGH (ref 150–400)
RBC: 4.97 MIL/uL (ref 3.87–5.11)
RDW: 15.5 % (ref 11.5–15.5)
WBC: 10.1 10*3/uL (ref 4.0–10.5)

## 2016-01-02 LAB — URINE MICROSCOPIC-ADD ON

## 2016-01-02 LAB — POC URINE PREG, ED: Preg Test, Ur: NEGATIVE

## 2016-01-02 NOTE — ED Notes (Signed)
THE PT IS C/O ABD PAIN AND NAUSEA FOR 2 HOURS  LMP DUE.

## 2016-01-03 ENCOUNTER — Encounter (HOSPITAL_COMMUNITY): Payer: Self-pay | Admitting: Radiology

## 2016-01-03 ENCOUNTER — Emergency Department (HOSPITAL_COMMUNITY)
Admission: EM | Admit: 2016-01-03 | Discharge: 2016-01-03 | Disposition: A | Discharge status: Home / Self Care | Payer: Medicaid Other | Attending: Emergency Medicine | Admitting: Emergency Medicine

## 2016-01-03 ENCOUNTER — Emergency Department (HOSPITAL_COMMUNITY): Payer: Medicaid Other

## 2016-01-03 DIAGNOSIS — R1010 Upper abdominal pain, unspecified: Secondary | ICD-10-CM

## 2016-01-03 DIAGNOSIS — R112 Nausea with vomiting, unspecified: Secondary | ICD-10-CM

## 2016-01-03 LAB — CBG MONITORING, ED: Glucose-Capillary: 143 mg/dL — ABNORMAL HIGH (ref 65–99)

## 2016-01-03 MED ORDER — ONDANSETRON HCL 4 MG/2ML IJ SOLN
4.0000 mg | Freq: Once | INTRAMUSCULAR | Status: AC
  Administered 2016-01-03: 4 mg via INTRAVENOUS
  Filled 2016-01-03: qty 2

## 2016-01-03 MED ORDER — DIPHENHYDRAMINE HCL 25 MG PO TABS: 25.0000 mg | ORAL_TABLET | Freq: Four times a day (QID) | ORAL | Status: DC

## 2016-01-03 MED ORDER — ONDANSETRON HCL 4 MG/2ML IJ SOLN
4.0000 mg | Freq: Once | INTRAMUSCULAR | Status: AC | PRN
  Administered 2016-01-03: 4 mg via INTRAVENOUS
  Filled 2016-01-03: qty 2

## 2016-01-03 MED ORDER — IOPAMIDOL (ISOVUE-300) INJECTION 61%
INTRAVENOUS | Status: AC
  Filled 2016-01-03: qty 100

## 2016-01-03 MED ORDER — METOCLOPRAMIDE HCL 10 MG PO TABS: 10.0000 mg | ORAL_TABLET | Freq: Four times a day (QID) | ORAL | Status: DC

## 2016-01-03 MED ORDER — METOCLOPRAMIDE HCL 5 MG/ML IJ SOLN
10.0000 mg | Freq: Once | INTRAMUSCULAR | Status: AC
  Administered 2016-01-03: 10 mg via INTRAVENOUS
  Filled 2016-01-03: qty 2

## 2016-01-03 MED ORDER — SODIUM CHLORIDE 0.9 % IV BOLUS (SEPSIS)
1000.0000 mL | Freq: Once | INTRAVENOUS | Status: AC
  Administered 2016-01-03: 1000 mL via INTRAVENOUS

## 2016-01-03 MED ORDER — IOPAMIDOL (ISOVUE-300) INJECTION 61%
INTRAVENOUS | Status: AC
  Administered 2016-01-03: 100 mL
  Filled 2016-01-03: qty 100

## 2016-01-03 MED ORDER — MORPHINE SULFATE (PF) 4 MG/ML IV SOLN
4.0000 mg | Freq: Once | INTRAVENOUS | Status: AC
  Administered 2016-01-03: 4 mg via INTRAVENOUS
  Filled 2016-01-03: qty 1

## 2016-01-03 MED ORDER — FENTANYL CITRATE (PF) 100 MCG/2ML IJ SOLN
50.0000 ug | INTRAMUSCULAR | Status: DC | PRN
Start: 2016-01-03 — End: 2016-01-03
  Administered 2016-01-03: 50 ug via INTRAVENOUS
  Filled 2016-01-03: qty 2

## 2016-01-03 NOTE — Discharge Instructions (Signed)
Dolor abdominal en adultos (Abdominal Pain, Adult) El dolor puede tener muchas causas. Normalmente la causa del dolor abdominal no es una enfermedad y Teacher, English as a foreign language sin Clinical research associate. Frecuentemente puede controlarse y tratarse en casa. Su mdico le Chartered certified accountant examen fsico y posiblemente solicite anlisis de sangre y radiografas para ayudar a Teacher, adult education la gravedad de su dolor. Sin embargo, en [employer], debe transcurrir ms tiempo antes de que se pueda Pension scheme manager una causa evidente del dolor. Antes de llegar a ese punto, es posible que su mdico no sepa si necesita ms pruebas o un tratamiento ms profundo. INSTRUCCIONES PARA EL CUIDADO EN EL HOGAR  Est atento al dolor para ver si hay cambios. Las siguientes indicaciones ayudarn a Chief Strategy Officer que pueda sentir:  Balm solo medicamentos de venta libre o recetados, segn las indicaciones del mdico.  No tome laxantes a menos que se lo haya indicado su mdico.  Pruebe con Ardelia Mems dieta lquida absoluta (caldo, t o agua) segn se lo indique su mdico. Introduzca gradualmente una dieta normal, segn su tolerancia. SOLICITE ATENCIN MDICA SI:  Tiene dolor abdominal sin explicacin.  Tiene dolor abdominal relacionado con nuseas o diarrea.  Tiene dolor cuando orina o defeca.  Experimenta dolor abdominal que lo despierta de noche.  Tiene dolor abdominal que empeora o mejora cuando come alimentos.  Tiene dolor abdominal que empeora cuando come alimentos grasosos.  Tiene fiebre. SOLICITE ATENCIN MDICA DE INMEDIATO SI:   El dolor no desaparece en un plazo mximo de 2horas.  No deja de (vomitar).  El Social research officer, government se siente solo en partes del abdomen, como el lado derecho o la parte inferior izquierda del abdomen.  Evaca materia fecal sanguinolenta o negra, de aspecto alquitranado. ASEGRESE DE QUE:  Comprende estas instrucciones.  Controlar su afeccin.  Recibir ayuda de inmediato si no mejora o si empeora.   Esta  informacin no tiene Marine scientist el consejo del mdico. Asegrese de hacerle al mdico cualquier pregunta que tenga.   Document Released: 08/29/2005 Document Revised: 09/19/2014 Elsevier Interactive Patient Education [employer].

## 2016-01-03 NOTE — ED Notes (Signed)
Dr. Little at the bedside. 

## 2016-01-03 NOTE — ED Notes (Signed)
Patient up to restroom.

## 2016-01-03 NOTE — ED Provider Notes (Signed)
CSN: [id number]     Arrival date & time 01/02/16  2203 History  By signing my name below, I, Evelene Croon, attest that this documentation has been prepared under the direction and in the presence of Sharlett Iles, MD . Electronically Signed: Evelene Croon, Scribe. 01/03/2016. 2:18 AM.    Chief Complaint  Patient presents with  . Abdominal Pain    The history is provided by the patient. No language interpreter was used.     HPI Comments:  Raven Turner is a 45 y.o. female with extensive PMH including IDDM, COPD, Cushing syndrome, NASH who presents to the Emergency Department complaining of nausea and vomiting, onset ~ 2000 this evening. She reports associated constant, severe upper abdominal pain.  Pt reports h/o similar pain in Jan 2017; she was admitted and diagnosed with NASH, CBD dilitation. She denies diarrhea, fever, and dysuria. No alleviating factors noted. No association of the pain with eating.   Past Medical History  Diagnosis Date  . Asthma   . Cushing syndrome ([place]) 2013   . Depression   . Anemia   . Osteopenia 11/19/2013    Dx with osteopenia in lumbar vertebra with partial compression of L1  . Diabetes ([place]) 2013   . Diabetes mellitus with neuropathy ([place])   . HTN (hypertension) 2005   . COPD (chronic obstructive pulmonary disease) ([place]) 2013  . Compression fracture   . Bilateral hand numbness 06/25/2015  . Bone pain 02/27/2015    Diffuse bone pain in legs mostly but also in arms    . Breast pain, left 03/27/2015  . Callus of heel 12/16/2014  . Compression fracture of L1 lumbar vertebra ([facility]) 08/26/2014  . Compression fracture of T12 vertebra ([place]) 08/28/2014  . Diabetes mellitus type 2, uncontrolled ([place]) 08/26/2014    Sees Dr. Katy Fitch.  Last visit 11/27/14.  No diabetic retinopathy    . Diastolic dysfunction without heart failure 10/14/2014    Grade 2 diastolic dysfunction 2/2 pulm HTN   . Generalized anxiety disorder 03/27/2015  . Hx of cataract surgery  11/03/2014  . LUQ abdominal pain 07/17/2015  . Medication management 05/12/2015  . Morbid obesity ([place]) 09/08/2014  . 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 Non-English speaking patient 09/08/2014  . OSA (obstructive sleep apnea) 05/20/2015  . Pain due to onychomycosis of toenail 03/27/2015  . Secondary Cushing's syndrome/ iatrogenic   . Skin rash 07/17/2015  . Tachycardia 01/13/2015  . Upper airway cough syndrome 09/05/2014    Followed in Pulmonary clinic/ [facility]/ [place]  - Trial off advair      09/05/2014 >>>  - trial off theoph 09/17/2014 and added flutter  - rec reduce dulera to 100 2 bid 07/13/2015     . Vision changes 06/25/2015  . Vitamin D deficiency 08/28/2014   Past Surgical History  Procedure Laterality Date  . Cataract extraction Bilateral 2014  . Vaginal hysterectomy     Family History  Problem Relation Age of Onset  . Diabetes Mother   . Stroke Father   . Asthma Father   . Hypertension Sister   . Hypertension Brother    Social History  Substance Use Topics  . Smoking status: Never Smoker   . Smokeless tobacco: Never Used  . Alcohol Use: No   OB History    No data available     Review of Systems 10 systems reviewed and all are negative for acute change except as noted in the HPI.   Allergies  Shellfish allergy; Septra; and  Hydrocortisone  Home Medications   Prior to Admission medications   Medication Sig Start Date End Date Taking? Authorizing Provider  acetaminophen-codeine (TYLENOL #3) 300-30 MG tablet TAKE 1 TO 2 TABLETS BY MOUTH AT BEDTIME AS NEEDED FOR MODERATE PAIN Patient taking differently: TAKE 1 TO 2 TABLETS BY MOUTH AT BEDTIME AS NEEDED FOR SEVERE PAIN OR COUGH 09/24/15   Josalyn Funches, MD  albuterol (PROVENTIL HFA;VENTOLIN HFA) 108 (90 Base) MCG/ACT inhaler Inhale 2 puffs into the lungs every 4 (four) hours as needed for wheezing or shortness of breath (((PLAN B))).    Historical Provider, MD  albuterol (PROVENTIL) (2.5 MG/3ML) 0.083% nebulizer  solution Take 3 mLs (2.5 mg total) by nebulization every 4 (four) hours as needed for wheezing or shortness of breath (plan C). 08/21/15   Boykin Nearing, MD  aspirin 325 MG tablet Take 325 mg by mouth every morning.    Historical Provider, MD  atorvastatin (LIPITOR) 20 MG tablet Take 20 mg by mouth daily at 6 PM.    Historical Provider, MD  Blood Glucose Monitoring Suppl (ACCU-CHEK AVIVA PLUS) W/DEVICE KIT Used as directed 11/05/14   Boykin Nearing, MD  cholecalciferol (VITAMIN D) 1000 UNITS tablet Take 1,000 Units by mouth every morning.    Historical Provider, MD  clonazePAM (KLONOPIN) 0.5 MG tablet Take 1 tablet (0.5 mg total) by mouth 2 (two) times daily as needed (trouble sleeping and anxiety). 10/29/15   Josalyn Funches, MD  cloNIDine (CATAPRES) 0.2 MG tablet Take 1 tablet (0.2 mg total) by mouth 3 (three) times daily. 12/15/14   Fay Records, MD  dextromethorphan-guaiFENesin [facility] DM) 30-600 MG 12hr tablet Take 1 tablet by mouth 2 (two) times daily as needed (with flutter valve for cough and congestion).    Historical Provider, MD  ferrous sulfate 325 (65 FE) MG tablet Take 1 tablet (325 mg total) by mouth daily with breakfast. 12/31/14   Boykin Nearing, MD  FOLIC ACID PO Take 1 tablet by mouth every morning.    Historical Provider, MD  furosemide (LASIX) 40 MG tablet Take 1 tablet (40 mg total) by mouth every morning. 03/27/15   Josalyn Funches, MD  gabapentin (NEURONTIN) 400 MG capsule TAKE 2 CAPSULES BY MOUTH TWICE DAILY 12/07/15   Josalyn Funches, MD  glucose blood (RELION GLUCOSE TEST STRIPS) test strip 1 each by Other route 4 (four) times daily. ICD code E11.65 07/09/15   Boykin Nearing, MD  insulin aspart (NOVOLOG) 100 UNIT/ML injection 90 U at breakfast, 90 U at lunch, 100 U at evening meal 12/21/15   Josalyn Funches, MD  Insulin Glargine (LANTUS SOLOSTAR) 100 UNIT/ML Solostar Pen Inject 100 Units into the skin at bedtime. 12/21/15   Josalyn Funches, MD  Insulin Pen Needle (B-D ULTRAFINE  III SHORT PEN) 31G X 8 MM MISC 1 application by Does not apply route daily. 03/27/15   Josalyn Funches, MD  Insulin Syringe-Needle U-100 (BD INSULIN SYRINGE ULTRAFINE) 31G X 15/64" 1 ML MISC 1 each by Does not apply route 3 (three) times daily. 03/27/15   Boykin Nearing, MD  Lancet Devices [facility]) lancets Use as instructed 04/03/15   Boykin Nearing, MD  losartan (COZAAR) 100 MG tablet Take 1 tablet (100 mg total) by mouth every morning. 08/24/15   Boykin Nearing, MD  metFORMIN (GLUCOPHAGE) 1000 MG tablet Take 1 tablet (1,000 mg total) by mouth 2 (two) times daily with a meal. 12/21/15   Josalyn Funches, MD  methocarbamol (ROBAXIN) 750 MG tablet TAKE 1 TABLET BY MOUTH EVERY  8 HOURS AS NEEDED FOR MUSCLE SPASMS. 12/28/15   Josalyn Funches, MD  mometasone-formoterol (DULERA) 100-5 MCG/ACT AERO Take 2 puffs first thing in am and then another 2 puffs about 12 hours later. 07/13/15   Tanda Rockers, MD  montelukast (SINGULAIR) 10 MG tablet Take 1 tablet (10 mg total) by mouth at bedtime. 03/27/15   Josalyn Funches, MD  mupirocin cream (BACTROBAN) 2 % Apply 1 application topically 3 (three) times daily. 11/05/15   Boykin Nearing, MD  omeprazole (PRILOSEC) 20 MG capsule Take 1 capsule (20 mg total) by mouth daily before breakfast. 07/23/15   Boykin Nearing, MD  [place] Calcium 500 MG TABS Take 0.4 tablets (500 mg total) by mouth 2 (two) times daily after a meal. 03/27/15   Josalyn Funches, MD  PARoxetine (PAXIL) 20 MG tablet Take 1 tablet (20 mg total) by mouth daily. Patient taking differently: Take 20 mg by mouth every morning.  09/25/15   Josalyn Funches, MD  predniSONE (DELTASONE) 1 MG tablet Take 4 mg by mouth daily with breakfast.     Historical Provider, MD  ranitidine (ZANTAC) 150 MG tablet TAKE 2 TABLETS BY MOUTH EVERY NIGHT AT BEDTIME 10/15/15   Josalyn Funches, MD  ReliOn Ultra Thin Lancets MISC 1 each by Does not apply route 4 (four) times daily. ICD code E11.65 03/27/15   Boykin Nearing, MD  Respiratory Therapy Supplies (FLUTTER) DEVI Use as directed 09/16/14   Tanda Rockers, MD  terbinafine (LAMISIL) 250 MG tablet Take 1 tablet (250 mg total) by mouth daily. 12/21/15   Josalyn Funches, MD  ULTICARE MICRO PEN NEEDLES 32G X 4 MM MISC USE AS DIRECTED 09/08/15   Josalyn Funches, MD  verapamil (VERELAN PM) 360 MG 24 hr capsule Take 1 capsule (360 mg total) by mouth every morning. 03/27/15   Josalyn Funches, MD   BP 141/64 mmHg  Pulse 125  Temp(Src) 99.4 F (37.4 C) (Oral)  Resp 16  Ht _0  (1.499 m)  Wt 182 lb 4 oz (82.668 kg)  BMI 36.79 kg/m2  SpO2 97%  LMP 11/11/2015 Physical Exam  Constitutional: She is oriented to person, place, and time. She appears well-developed and well-nourished. She appears distressed.  In mild distress due to pain   HENT:  Head: Normocephalic and atraumatic.  Moist mucous membranes  Eyes: Conjunctivae are normal. Pupils are equal, round, and reactive to light.  Neck: Neck supple.  Cardiovascular: Normal rate, regular rhythm and normal heart sounds.   No murmur heard. Pulmonary/Chest: Effort normal and breath sounds normal.  Abdominal: Soft. Bowel sounds are normal. She exhibits no distension. There is tenderness.  LUQ, midepigastric, and RUQ TTP  Musculoskeletal: She exhibits no edema.  Neurological: She is alert and oriented to person, place, and time.  Fluent speech  Skin: Skin is warm and dry.  Psychiatric: Judgment normal.  distressed  Nursing note and vitals reviewed.   ED Course  Procedures  DIAGNOSTIC STUDIES:  Oxygen Saturation is 98% on RA, normal by my interpretation.    COORDINATION OF CARE:  1:24 AM Discussed treatment plan with pt at bedside and pt agreed to plan.  Labs Review Labs Reviewed  COMPREHENSIVE METABOLIC PANEL - Abnormal; Notable for the following:    Glucose, Bld 118 (*)    Total Protein 8.4 (*)    AST 48 (*)    All other components within normal limits  CBC - Abnormal; Notable for the  following:    MCH 25.8 (*)  Platelets 410 (*)    All other components within normal limits  URINALYSIS, ROUTINE W REFLEX MICROSCOPIC (NOT AT [facility]) - Abnormal; Notable for the following:    APPearance CLOUDY (*)    Hgb urine dipstick LARGE (*)    Protein, ur 30 (*)    Leukocytes, UA SMALL (*)    All other components within normal limits  URINE MICROSCOPIC-ADD ON - Abnormal; Notable for the following:    Squamous Epithelial / LPF 6-30 (*)    Bacteria, UA MANY (*)    All other components within normal limits  CBG MONITORING, ED - Abnormal; Notable for the following:    Glucose-Capillary 143 (*)    All other components within normal limits  LIPASE, BLOOD  POC URINE PREG, ED    Imaging Review US Abdomen Complete  01/03/2016  CLINICAL DATA:  Right upper quadrant and epigastric pain. Nausea and vomiting. History of NASH in common bile duct dilatation. EXAM: ABDOMEN ULTRASOUND COMPLETE COMPARISON:  Abdominal ultrasound 09/13/2015. Abdominal MRI 09/14/2015 FINDINGS: Gallbladder: Physiologically distended containing questionable intraluminal sludge. No gallstones or wall thickening visualized. No sonographic Murphy sign noted by sonographer. Common bile duct: Diameter: 9 mm.  Previously 8 mm by ultrasound. Liver: No focal lesion identified. Diffusely increased and heterogeneous parenchymal echogenicity. Normal directional flow in the main portal vein. IVC: No abnormality visualized, technically difficult evaluation. Pancreas: Visualized portion unremarkable. Spleen: Normal in size. Within the upper spleen is a 2.3 x 1.7 x 2.1 cm complex area that corresponds to the hemangioma as seen on prior MRI. Right Kidney: Length: 12.4 cm. Echogenicity within normal limits. No mass or hydronephrosis visualized. Left Kidney: Length: 12.4 cm. Echogenicity within normal limits. No mass or hydronephrosis visualized. Abdominal aorta: Upper abdominal aorta normal in caliber. Mid and distal aorta are obscured. Other  findings: No ascites. Probable fluid-filled bowel loops in the midline. IMPRESSION: 1. Severe hepatic steatosis. 2. Questionable sludge in the gallbladder. No gallstones or gallbladder wall thickening. There is unchanged dilatation of the proximal common bile duct measuring 9 mm. Electronically Signed   By: Jeb Levering M.D.   On: 01/03/2016 03:35   I have personally reviewed and evaluated these  lab results as part of my medical decision-making.  Medications  fentaNYL (SUBLIMAZE) injection 50 mcg (50 mcg Intravenous Given 01/03/16 0118)  ondansetron (ZOFRAN) injection 4 mg (4 mg Intravenous Given 01/03/16 0118)  morphine 4 MG/ML injection 4 mg (4 mg Intravenous Given 01/03/16 0147)  ondansetron (ZOFRAN) injection 4 mg (4 mg Intravenous Given 01/03/16 0410)  sodium chloride 0.9 % bolus 1,000 mL (0 mLs Intravenous Stopped 01/03/16 0245)  iopamidol (ISOVUE-300) 61 % injection (100 mLs  Contrast Given 01/03/16 0852)  metoCLOPramide (REGLAN) injection 10 mg (10 mg Intravenous Given 01/03/16 0708)     MDM   Final diagnoses:  None   Patient with extensive medical history including hospitalization several months ago for NASH and CBD dilitation p/w ever lowers of vomiting as well as severe upper abdominal pain. She was in mild distress due to pain on exam. Vital signs notable for tachycardia in 110s. She had left upper quadrant, midepigastric, and right upper quadrant tenderness, no peritonitis. Obtained above lab work which shows glucose 118, AST 48, the remainder of her CMP is normal. Lipase unremarkable. UA with small leukocytes, some squamous cells and bacteria, WBCs but patient denies any urinary symptoms. Gave the patient morphine, Zofran, and an IV fluid bolus. Because of her history of common bile duct dilatation, obtained abdominal ultrasound for  better evaluation.  Ultrasound showed severe hepatic steatosis, sludge in gallbladder but no stones or gallbladder wall thickening. Unchanged CBD at 9  mm. Because of patient's ongoing symptoms, I ordered a CT of the abdomen and pelvis to evaluate for other acute intra-abdominal process such as bowel obstruction. The patient is awaiting her CT scan and I am signing out to the oncoming provider. The patient's disposition is pending CT results and clinical improvement.  I personally performed the services described in this documentation, which was scribed in my presence. The recorded information has been reviewed and is accurate.    Sharlett Iles, MD 01/03/16 [phone]

## 2016-01-03 NOTE — ED Notes (Signed)
Patient transported to CT 

## 2016-01-03 NOTE — ED Provider Notes (Signed)
Patient accepted at sign out from Dr. Rex Kras. Plan to follow up CT results and determine if patient stable for D/C. Ct shows no acute findings. Patient has chronic hepatic steatosis being managed by PCP. Patient felt most improvement of symptoms with Reglan. Will prescribe Reglan with benadryl for synergistic effect and advise for follow up with PCP early this week. Signs and symptoms for which to return are reviewed.  Charlesetta Shanks, MD 01/03/16 1034

## 2016-01-04 LAB — URINE CULTURE

## 2016-01-07 ENCOUNTER — Ambulatory Visit: Payer: Medicaid Other | Admitting: Neurology

## 2016-01-09 ENCOUNTER — Other Ambulatory Visit: Payer: Self-pay | Admitting: Family Medicine

## 2016-01-11 ENCOUNTER — Ambulatory Visit: Payer: Medicaid Other | Attending: Family Medicine | Admitting: Family Medicine

## 2016-01-11 ENCOUNTER — Encounter: Payer: Self-pay | Admitting: Family Medicine

## 2016-01-11 VITALS — BP 139/82 | HR 89 | Temp 98.4°F | Resp 16 | Ht <= 58 in | Wt 178.0 lb

## 2016-01-11 DIAGNOSIS — T380X5A Adverse effect of glucocorticoids and synthetic analogues, initial encounter: Secondary | ICD-10-CM

## 2016-01-11 DIAGNOSIS — L309 Dermatitis, unspecified: Secondary | ICD-10-CM | POA: Diagnosis not present

## 2016-01-11 DIAGNOSIS — Z79899 Other long term (current) drug therapy: Secondary | ICD-10-CM | POA: Diagnosis not present

## 2016-01-11 DIAGNOSIS — I10 Essential (primary) hypertension: Secondary | ICD-10-CM | POA: Diagnosis not present

## 2016-01-11 DIAGNOSIS — M818 Other osteoporosis without current pathological fracture: Secondary | ICD-10-CM

## 2016-01-11 DIAGNOSIS — E1165 Type 2 diabetes mellitus with hyperglycemia: Secondary | ICD-10-CM

## 2016-01-11 DIAGNOSIS — R109 Unspecified abdominal pain: Secondary | ICD-10-CM | POA: Diagnosis not present

## 2016-01-11 DIAGNOSIS — F411 Generalized anxiety disorder: Secondary | ICD-10-CM

## 2016-01-11 DIAGNOSIS — Z7982 Long term (current) use of aspirin: Secondary | ICD-10-CM | POA: Diagnosis not present

## 2016-01-11 DIAGNOSIS — K7581 Nonalcoholic steatohepatitis (NASH): Secondary | ICD-10-CM | POA: Diagnosis not present

## 2016-01-11 DIAGNOSIS — M4854XS Collapsed vertebra, not elsewhere classified, thoracic region, sequela of fracture: Secondary | ICD-10-CM

## 2016-01-11 DIAGNOSIS — G8929 Other chronic pain: Secondary | ICD-10-CM | POA: Diagnosis not present

## 2016-01-11 DIAGNOSIS — Z794 Long term (current) use of insulin: Secondary | ICD-10-CM | POA: Diagnosis not present

## 2016-01-11 DIAGNOSIS — S32010S Wedge compression fracture of first lumbar vertebra, sequela: Secondary | ICD-10-CM

## 2016-01-11 DIAGNOSIS — J45998 Other asthma: Secondary | ICD-10-CM

## 2016-01-11 DIAGNOSIS — E119 Type 2 diabetes mellitus without complications: Secondary | ICD-10-CM | POA: Diagnosis present

## 2016-01-11 DIAGNOSIS — M549 Dorsalgia, unspecified: Secondary | ICD-10-CM

## 2016-01-11 DIAGNOSIS — S22080S Wedge compression fracture of T11-T12 vertebra, sequela: Secondary | ICD-10-CM

## 2016-01-11 DIAGNOSIS — Z7984 Long term (current) use of oral hypoglycemic drugs: Secondary | ICD-10-CM | POA: Insufficient documentation

## 2016-01-11 DIAGNOSIS — IMO0001 Reserved for inherently not codable concepts without codable children: Secondary | ICD-10-CM

## 2016-01-11 LAB — GLUCOSE, POCT (MANUAL RESULT ENTRY): POC Glucose: 119 mg/dl — AB (ref 70–99)

## 2016-01-11 MED ORDER — LIDOCAINE 5 % EX PTCH: 1.0000 | MEDICATED_PATCH | CUTANEOUS | Status: DC

## 2016-01-11 MED ORDER — BETAMETHASONE VALERATE 0.12 % EX FOAM: 1.0000 "application " | Freq: Two times a day (BID) | CUTANEOUS | Status: DC

## 2016-01-11 MED ORDER — GABAPENTIN 400 MG PO CAPS: 800.0000 mg | ORAL_CAPSULE | Freq: Three times a day (TID) | ORAL | Status: DC

## 2016-01-11 MED ORDER — RANITIDINE HCL 300 MG PO TABS: 300.0000 mg | ORAL_TABLET | Freq: Every day | ORAL | Status: DC

## 2016-01-11 MED ORDER — CLONIDINE HCL 0.2 MG PO TABS: 0.2000 mg | ORAL_TABLET | Freq: Three times a day (TID) | ORAL | Status: DC

## 2016-01-11 MED ORDER — METHOCARBAMOL 750 MG PO TABS: 750.0000 mg | ORAL_TABLET | Freq: Three times a day (TID) | ORAL | Status: DC | PRN

## 2016-01-11 MED ORDER — OMEPRAZOLE 20 MG PO CPDR: 20.0000 mg | DELAYED_RELEASE_CAPSULE | Freq: Every day | ORAL | Status: DC

## 2016-01-11 MED ORDER — METOCLOPRAMIDE HCL 10 MG PO TABS: 10.0000 mg | ORAL_TABLET | Freq: Four times a day (QID) | ORAL | Status: DC

## 2016-01-11 MED ORDER — CLONAZEPAM 0.5 MG PO TABS: 0.5000 mg | ORAL_TABLET | Freq: Two times a day (BID) | ORAL | Status: DC | PRN

## 2016-01-11 MED FILL — VERAPAMIL 360 MG CAP PELLET: 360 | 30 days supply | Qty: 30 | Fill #2

## 2016-01-11 NOTE — Progress Notes (Signed)
Subjective:  Patient ID: Raven Turner, female    DOB: 1971-02-19  Age: 45 y.o. MRN: [id number]  CC: Abdominal Pain   HPI Raven Turner has steroid induced osteoporosis and Cushing syndrome, GERD, HTN, DM,  Thoracic and lumbar compression fractures presents for   1.  ED f/u upper abdominal pain: was in ED on 4/22-23/17 for with acute onset of upper abdominal pain. Also has worsening mid and low back pain. Has nausea and anorexia. Slowly  improving. No fever. Had similar pain in January 2017 and was found to have dilated common bile duct in setting of NASH. Reglan helps with nausea. She still has moderate upper abdominal pain. She admits to worsening anxiety. She admits to worsening pain in legs from knees to ankles b/l. She has mild elevation in AST on last labs, she has stopped taking lamisil.   CT abdomen and pelvis 01/03/2016: CLINICAL DATA: Upper abdominal pain, emesis, gallbladder sludge and dilated CBD Hx of DM, HTN  EXAM: CT ABDOMEN AND PELVIS WITH CONTRAST  TECHNIQUE: Multidetector CT imaging of the abdomen and pelvis was performed using the standard protocol following bolus administration of intravenous contrast.  CONTRAST: 160m ISOVUE-300 IOPAMIDOL (ISOVUE-300) INJECTION 61%  COMPARISON: Ultrasound from earlier the same day, and previous studies  FINDINGS: Lower chest: No acute findings.  Hepatobiliary: Fatty liver without focal lesion. No biliary ductal dilatation. Gallbladder physiologically distended.  Pancreas: No mass, inflammatory changes, or other significant abnormality.  Spleen: Normal in size. 18 mm cyst or hemangioma as seen on MR from 09/14/2015.  Adrenals/Urinary Tract: Right nephrolithiasis with at least three 1- 2 mm calculi. There is probably a 1 mm calculus in the lower pole left renal collecting system as well. No masses identified. No evidence of hydronephrosis.  Stomach/Bowel: No evidence of obstruction,  inflammatory process, or abnormal fluid collections. Normal appendix.  Vascular/Lymphatic: No pathologically enlarged lymph nodes. No evidence of abdominal aortic aneurysm. Bilateral pelvic phleboliths.  Reproductive: No mass or other significant abnormality.  Other: No ascites. No free air.  Musculoskeletal: Vertebral endplate compression deformities at all visualized levels most severe at T9 with greater than 50% loss of height centrally. No retropulsion. Posterior elements appear intact.  IMPRESSION: 1. No acute abdominal findings. 2. Fatty liver. 3. Nephrolithiasis without hydronephrosis. 4. Multiple thoracolumbar compression fracture deformities ,many evident on prior radiographs dating back to 08/26/2014. If there is persistent pain or a concern of acute exacerbation, consider MR for further characterization when the patient is clinically stable and able to follow directions and hold their breath (preferably as an outpatient) .   Electronically Signed  By: DLucrezia EuropeM.D.  On: 01/03/2016 09:32  Social History  Substance Use Topics  . Smoking status: Never Smoker   . Smokeless tobacco: Never Used  . Alcohol Use: No    Past Surgical History  Procedure Laterality Date  . Cataract extraction Bilateral 2014  . Vaginal hysterectomy     Outpatient Prescriptions Prior to Visit  Medication Sig Dispense Refill  . acetaminophen-codeine (TYLENOL #3) 300-30 MG tablet TAKE 1 TO 2 TABLETS BY MOUTH AT BEDTIME AS NEEDED FOR MODERATE PAIN (Patient taking differently: TAKE 1 TO 2 TABLETS BY MOUTH AT BEDTIME AS NEEDED FOR SEVERE PAIN OR COUGH) 60 tablet 2  . albuterol (PROVENTIL HFA;VENTOLIN HFA) 108 (90 Base) MCG/ACT inhaler Inhale 2 puffs into the lungs every 4 (four) hours as needed for wheezing or shortness of breath (((PLAN B))).    .Marland Kitchenalbuterol (PROVENTIL) (2.5 MG/3ML) 0.083% nebulizer solution Take 3  mLs (2.5 mg total) by nebulization every 4 (four) hours as needed for  wheezing or shortness of breath (plan C). 75 mL 3  . aspirin 325 MG tablet Take 325 mg by mouth every morning.    Marland Kitchen atorvastatin (LIPITOR) 20 MG tablet Take 20 mg by mouth daily at 6 PM.    . Blood Glucose Monitoring Suppl (ACCU-CHEK AVIVA PLUS) W/DEVICE KIT Used as directed 1 kit 0  . cholecalciferol (VITAMIN D) 1000 UNITS tablet Take 1,000 Units by mouth every morning.    . clonazePAM (KLONOPIN) 0.5 MG tablet Take 1 tablet (0.5 mg total) by mouth 2 (two) times daily as needed (trouble sleeping and anxiety). 30 tablet 2  . cloNIDine (CATAPRES) 0.2 MG tablet Take 1 tablet (0.2 mg total) by mouth 3 (three) times daily. 90 tablet 11  . dextromethorphan-guaiFENesin (MUCINEX DM) 30-600 MG 12hr tablet Take 1 tablet by mouth 2 (two) times daily as needed (with flutter valve for cough and congestion).    . diphenhydrAMINE (BENADRYL) 25 MG tablet Take 1 tablet (25 mg total) by mouth every 6 (six) hours. Take with your dose of reglan 30 tablet 0  . ferrous sulfate 325 (65 FE) MG tablet Take 1 tablet (325 mg total) by mouth daily with breakfast. 30 tablet 3  . FOLIC ACID PO Take 1 tablet by mouth every morning.    . furosemide (LASIX) 40 MG tablet Take 1 tablet (40 mg total) by mouth every morning. 30 tablet 2  . gabapentin (NEURONTIN) 400 MG capsule TAKE 2 CAPSULES BY MOUTH TWICE DAILY 120 capsule 2  . glucose blood (RELION GLUCOSE TEST STRIPS) test strip 1 each by Other route 4 (four) times daily. ICD code E11.65 400 each 3  . insulin aspart (NOVOLOG) 100 UNIT/ML injection 90 U at breakfast, 90 U at lunch, 100 U at evening meal 3 vial 3  . Insulin Glargine (LANTUS SOLOSTAR) 100 UNIT/ML Solostar Pen Inject 100 Units into the skin at bedtime. 5 pen 11  . Insulin Pen Needle (B-D ULTRAFINE III SHORT PEN) 31G X 8 MM MISC 1 application by Does not apply route daily. 100 each 3  . Insulin Syringe-Needle U-100 (BD INSULIN SYRINGE ULTRAFINE) 31G X 15/64" 1 ML MISC 1 each by Does not apply route 3 (three) times  daily. 300 each 3  . Lancet Devices (ACCU-CHEK SOFTCLIX) lancets Use as instructed 1 each 0  . losartan (COZAAR) 100 MG tablet Take 1 tablet (100 mg total) by mouth every morning. 30 tablet 3  . metFORMIN (GLUCOPHAGE) 1000 MG tablet Take 1 tablet (1,000 mg total) by mouth 2 (two) times daily with a meal. 60 tablet 11  . methocarbamol (ROBAXIN) 750 MG tablet TAKE 1 TABLET BY MOUTH EVERY 8 HOURS AS NEEDED FOR MUSCLE SPASMS. 90 tablet 0  . metoCLOPramide (REGLAN) 10 MG tablet Take 1 tablet (10 mg total) by mouth every 6 (six) hours. 30 tablet 0  . mometasone-formoterol (DULERA) 100-5 MCG/ACT AERO Take 2 puffs first thing in am and then another 2 puffs about 12 hours later. 1 Inhaler 11  . montelukast (SINGULAIR) 10 MG tablet Take 1 tablet (10 mg total) by mouth at bedtime. 30 tablet 11  . mupirocin cream (BACTROBAN) 2 % Apply 1 application topically 3 (three) times daily. 15 g 0  . omeprazole (PRILOSEC) 20 MG capsule Take 1 capsule (20 mg total) by mouth daily before breakfast. 90 capsule 1  . Oyster Shell Calcium 500 MG TABS Take 0.4 tablets (500 mg total)  by mouth 2 (two) times daily after a meal. 60 tablet 11  . PARoxetine (PAXIL) 20 MG tablet Take 1 tablet (20 mg total) by mouth daily. (Patient taking differently: Take 20 mg by mouth every morning. ) 30 tablet 5  . predniSONE (DELTASONE) 1 MG tablet Take 4 mg by mouth daily with breakfast.     . ranitidine (ZANTAC) 150 MG tablet TAKE 2 TABLETS BY MOUTH EVERY NIGHT AT BEDTIME 60 tablet 2  . ReliOn Ultra Thin Lancets MISC 1 each by Does not apply route 4 (four) times daily. ICD code E11.65 400 each 3  . Respiratory Therapy Supplies (FLUTTER) DEVI Use as directed 1 each 0  . terbinafine (LAMISIL) 250 MG tablet Take 1 tablet (250 mg total) by mouth daily. 30 tablet 2  . ULTICARE MICRO PEN NEEDLES 32G X 4 MM MISC USE AS DIRECTED 100 each 2  . verapamil (VERELAN PM) 360 MG 24 hr capsule Take 1 capsule (360 mg total) by mouth every morning. 30 capsule 11    Facility-Administered Medications Prior to Visit  Medication Dose Route Frequency Provider Last Rate Last Dose  . DOBUTamine (DOBUTREX) 1,000 mcg/mL in dextrose 5% 250 mL infusion  30 mcg/kg/min Intravenous Continuous Mihai Croitoru, MD 145.3 mL/hr at 04/22/15 1300 30 mcg/kg/min at 04/22/15 1300    ROS Review of Systems  Constitutional: Negative for fever and chills.  HENT: Negative for congestion.   Eyes: Negative for visual disturbance.  Respiratory: Negative for cough and shortness of breath.   Cardiovascular: Negative for chest pain.  Gastrointestinal: Positive for nausea and abdominal pain. Negative for vomiting, diarrhea, constipation, blood in stool, abdominal distention, anal bleeding and rectal pain.  Musculoskeletal: Positive for myalgias, back pain and arthralgias.  Skin: Positive for rash. Negative for wound.  Allergic/Immunologic: Negative for immunocompromised state.  Hematological: Negative for adenopathy. Does not bruise/bleed easily.  Psychiatric/Behavioral: Negative for suicidal ideas and dysphoric mood.    Objective:  BP 139/82 mmHg  Pulse 89  Temp(Src) 98.4 F (36.9 C) (Oral)  Resp 16  Ht 4' 10" (1.473 m)  Wt 178 lb (80.74 kg)  BMI 37.21 kg/m2  SpO2 97%  LMP 11/11/2015  BP/Weight 01/11/2016 01/03/2016 5/91/6384  Systolic BP 665 993 -  Diastolic BP 82 81 -  Wt. (Lbs) 178 - 182.25  BMI 37.21 36.79 -   Physical Exam  Constitutional: She is oriented to person, place, and time. She appears well-developed and well-nourished. No distress.  HENT:  Head: Normocephalic and atraumatic.  Cardiovascular: Normal rate, regular rhythm, normal heart sounds and intact distal pulses.   Pulmonary/Chest: Effort normal and breath sounds normal. No respiratory distress. She has no wheezes. She has no rales. She exhibits no tenderness.  Abdominal: Soft. Bowel sounds are normal. She exhibits no distension and no mass. There is tenderness in the epigastric area. There is no  rebound and no guarding.  Musculoskeletal: She exhibits no edema.       Thoracic back: She exhibits tenderness, bony tenderness and pain. She exhibits normal range of motion, no edema, no deformity, no laceration, no spasm and normal pulse.       Lumbar back: She exhibits tenderness, bony tenderness and pain. She exhibits normal range of motion, no swelling, no laceration and normal pulse.  Neurological: She is alert and oriented to person, place, and time.  Skin: Skin is warm and dry. No rash noted.     Psychiatric: She has a normal mood and affect.    Lab Results  Component Value Date   HGBA1C 8.0 12/21/2015   CBG 119 Assessment & Plan:   Raven Turner was seen today for abdominal pain.  Diagnoses and all orders for this visit:  Uncontrolled diabetes mellitus type 2 without complications, unspecified long term insulin use status (HCC) -     POCT glucose (manual entry)  Recurrent abdominal pain -     Ambulatory referral to Gastroenterology -     metoCLOPramide (REGLAN) 10 MG tablet; Take 1 tablet (10 mg total) by mouth every 6 (six) hours.  Compression fracture of T12 vertebra, sequela -     MR Lumbar Spine Wo Contrast; Future -     MR Thoracic Spine Wo Contrast; Future  Compression fracture of L1 lumbar vertebra, sequela -     MR Lumbar Spine Wo Contrast; Future  Generalized anxiety disorder -     clonazePAM (KLONOPIN) 0.5 MG tablet; Take 1 tablet (0.5 mg total) by mouth 2 (two) times daily as needed (trouble sleeping and anxiety).  Essential hypertension -     cloNIDine (CATAPRES) 0.2 MG tablet; Take 1 tablet (0.2 mg total) by mouth 3 (three) times daily.  Asthma ? severity vs VCD/ pseudo asthma -     omeprazole (PRILOSEC) 20 MG capsule; Take 1 capsule (20 mg total) by mouth daily before breakfast. -     ranitidine (ZANTAC) 300 MG tablet; Take 1 tablet (300 mg total) by mouth at bedtime.  Chronic back pain -     methocarbamol (ROBAXIN) 750 MG tablet; Take 1 tablet (750  mg total) by mouth every 8 (eight) hours as needed for muscle spasms. -     gabapentin (NEURONTIN) 400 MG capsule; Take 2 capsules (800 mg total) by mouth 3 (three) times daily. -     lidocaine (LIDODERM) 5 %; Place 1 patch onto the skin daily. Remove & Discard patch within 12 hours or as directed by MD  Dermatitis -     Betamethasone Valerate 0.12 % foam; Apply 1 application topically 2 (two) times daily. To scalp  Steroid-induced osteoporosis -     Comprehensive metabolic panel; Future  NASH (nonalcoholic steatohepatitis) -     Comprehensive metabolic panel; Future   No orders of the defined types were placed in this encounter.    Follow-up: No Follow-up on file.   Boykin Nearing MD

## 2016-01-11 NOTE — Assessment & Plan Note (Signed)
Recurrent epigastric pain GERD vs biliary tract disease vs referred pain from thoracic spine compression fractures  Plan: Add lidoderm patch for back pain control Increase gabapentin Continue reglan, H2 blocker, PPI. Increase klonopin for associated anxiety component GI referral for possible ERCP

## 2016-01-11 NOTE — Progress Notes (Signed)
F/U ED due to abdominal pain vomiting  Pain scale # 4 No tobacco user  No suicidal thoughts in the past two weeks

## 2016-01-11 NOTE — Patient Instructions (Addendum)
Raven Turner was seen today for abdominal pain.  Diagnoses and all orders for this visit:  Uncontrolled diabetes mellitus type 2 without complications, unspecified long term insulin use status (HCC) -     POCT glucose (manual entry)  Recurrent abdominal pain -     Ambulatory referral to Gastroenterology -     metoCLOPramide (REGLAN) 10 MG tablet; Take 1 tablet (10 mg total) by mouth every 6 (six) hours.  Compression fracture of T12 vertebra, sequela -     MR Lumbar Spine Wo Contrast; Future -     MR Thoracic Spine Wo Contrast; Future  Compression fracture of L1 lumbar vertebra, sequela -     MR Lumbar Spine Wo Contrast; Future  Generalized anxiety disorder -     clonazePAM (KLONOPIN) 0.5 MG tablet; Take 1 tablet (0.5 mg total) by mouth 2 (two) times daily as needed (trouble sleeping and anxiety).  Essential hypertension -     cloNIDine (CATAPRES) 0.2 MG tablet; Take 1 tablet (0.2 mg total) by mouth 3 (three) times daily.  Asthma ? severity vs VCD/ pseudo asthma -     omeprazole (PRILOSEC) 20 MG capsule; Take 1 capsule (20 mg total) by mouth daily before breakfast. -     ranitidine (ZANTAC) 300 MG tablet; Take 1 tablet (300 mg total) by mouth at bedtime.  Chronic back pain -     methocarbamol (ROBAXIN) 750 MG tablet; Take 1 tablet (750 mg total) by mouth every 8 (eight) hours as needed for muscle spasms. -     gabapentin (NEURONTIN) 400 MG capsule; Take 2 capsules (800 mg total) by mouth 3 (three) times daily. -     lidocaine (LIDODERM) 5 %; Place 1 patch onto the skin daily. Remove & Discard patch within 12 hours or as directed by MD  Dermatitis -     Betamethasone Valerate 0.12 % foam; Apply 1 application topically 2 (two) times daily. To scalp  next reclast injection is due on 03/10/2016  F/u in one month for labs and f/u abdominal and back pain   Dr. Adrian Blackwater

## 2016-01-15 ENCOUNTER — Encounter (HOSPITAL_COMMUNITY): Payer: Self-pay

## 2016-01-15 ENCOUNTER — Ambulatory Visit (HOSPITAL_COMMUNITY)
Admission: EM | Admit: 2016-01-15 | Discharge: 2016-01-15 | Disposition: A | Discharge status: Home / Self Care | Payer: Medicaid Other | Attending: Emergency Medicine | Admitting: Emergency Medicine

## 2016-01-15 ENCOUNTER — Ambulatory Visit (INDEPENDENT_AMBULATORY_CARE_PROVIDER_SITE_OTHER): Payer: Medicaid Other

## 2016-01-15 DIAGNOSIS — M79604 Pain in right leg: Secondary | ICD-10-CM | POA: Diagnosis not present

## 2016-01-15 DIAGNOSIS — M79601 Pain in right arm: Secondary | ICD-10-CM

## 2016-01-15 NOTE — ED Notes (Signed)
Patient presents with pain rt leg x2 days around the shin area she has been taking OTC medications, no injuries or trauma and she tripped but did not fall. Hx of osteoporosis and fractured vertebrae No acute distress

## 2016-01-15 NOTE — ED Provider Notes (Signed)
CSN: [id number]     Arrival date & time 01/15/16  1430 History   First MD Initiated Contact with Patient 01/15/16 1559     Chief Complaint  Patient presents with  . Leg Pain   HPI Pt is a 45 y.o. female with h/o osteoporosis, diffuse bone pain and multiple vertebral compression fractures who presents for shin pain. Pt reports the pain started yesterday and was located just above her right ankle, nonradiating. Pain is sharp and worse with ambulation/weight bearing. She tried a topical pain cream and tylenol 3 which did not relieve the pain. She reports that she did not fall or twist the ankle but did trip yesterday and that's the only injury she can think of but she didn't hit the leg on anything when this happened. She reports it is slightly swollen with no redness or bruising. She denies any fever or chills.   Past Medical History  Diagnosis Date  . Asthma   . Cushing syndrome ([place]) 2013   . Depression   . Anemia   . Osteopenia 11/19/2013    Dx with osteopenia in lumbar vertebra with partial compression of L1  . Diabetes ([place]) 2013   . Diabetes mellitus with neuropathy ([place])   . HTN (hypertension) 2005   . COPD (chronic obstructive pulmonary disease) ([place]) 2013  . Compression fracture   . Bilateral hand numbness 06/25/2015  . Bone pain 02/27/2015    Diffuse bone pain in legs mostly but also in arms    . Breast pain, left 03/27/2015  . Callus of heel 12/16/2014  . Compression fracture of L1 lumbar vertebra ([facility]) 08/26/2014  . Compression fracture of T12 vertebra ([place]) 08/28/2014  . Diabetes mellitus type 2, uncontrolled ([place]) 08/26/2014    Sees Dr. Katy Fitch.  Last visit 11/27/14.  No diabetic retinopathy    . Diastolic dysfunction without heart failure 10/14/2014    Grade 2 diastolic dysfunction 2/2 pulm HTN   . Generalized anxiety disorder 03/27/2015  . Hx of cataract surgery 11/03/2014  . LUQ abdominal pain 07/17/2015  . Medication management 05/12/2015  . Morbid obesity ([place]) 09/08/2014  .  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 Non-English speaking patient 09/08/2014  . OSA (obstructive sleep apnea) 05/20/2015  . Pain due to onychomycosis of toenail 03/27/2015  . Secondary Cushing's syndrome/ iatrogenic   . Skin rash 07/17/2015  . Tachycardia 01/13/2015  . Upper airway cough syndrome 09/05/2014    Followed in Pulmonary clinic/ [facility]/ [facility]  - Trial off advair      09/05/2014 >>>  - trial off theoph 09/17/2014 and added flutter  - rec reduce dulera to 100 2 bid 07/13/2015     . Vision changes 06/25/2015  . Vitamin D deficiency 08/28/2014   Past Surgical History  Procedure Laterality Date  . Cataract extraction Bilateral 2014  . Vaginal hysterectomy    . Hand surgery Right 12/18/2015   Family History  Problem Relation Age of Onset  . Diabetes Mother   . Stroke Father   . Asthma Father   . Hypertension Sister   . Hypertension Brother    Social History  Substance Use Topics  . Smoking status: Never Smoker   . Smokeless tobacco: Never Used  . Alcohol Use: No   OB History    No data available     Review of Systems  All other systems reviewed and are negative. See HPI  Allergies  Shellfish allergy; Septra; and Hydrocortisone  Home Medications   Prior to Admission  medications   Medication Sig Start Date End Date Taking? Authorizing Provider  acetaminophen-codeine (TYLENOL #3) 300-30 MG tablet TAKE 1 TO 2 TABLETS BY MOUTH AT BEDTIME AS NEEDED FOR MODERATE PAIN Patient taking differently: TAKE 1 TO 2 TABLETS BY MOUTH AT BEDTIME AS NEEDED FOR SEVERE PAIN OR COUGH 09/24/15  Yes Josalyn Funches, MD  albuterol (PROVENTIL HFA;VENTOLIN HFA) 108 (90 Base) MCG/ACT inhaler Inhale 2 puffs into the lungs every 4 (four) hours as needed for wheezing or shortness of breath (((PLAN B))).   Yes Historical Provider, MD  albuterol (PROVENTIL) (2.5 MG/3ML) 0.083% nebulizer solution Take 3 mLs (2.5 mg total) by nebulization every 4 (four) hours as needed for wheezing or shortness of  breath (plan C). 08/21/15  Yes Boykin Nearing, MD  aspirin 325 MG tablet Take 325 mg by mouth every morning.   Yes Historical Provider, MD  atorvastatin (LIPITOR) 20 MG tablet Take 20 mg by mouth daily at 6 PM.   Yes Historical Provider, MD  cholecalciferol (VITAMIN D) 1000 UNITS tablet Take 1,000 Units by mouth every morning.   Yes Historical Provider, MD  clonazePAM (KLONOPIN) 0.5 MG tablet Take 1 tablet (0.5 mg total) by mouth 2 (two) times daily as needed (trouble sleeping and anxiety). 01/11/16  Yes Josalyn Funches, MD  cloNIDine (CATAPRES) 0.2 MG tablet Take 1 tablet (0.2 mg total) by mouth 3 (three) times daily. 01/11/16  Yes Josalyn Funches, MD  diphenhydrAMINE (BENADRYL) 25 MG tablet Take 1 tablet (25 mg total) by mouth every 6 (six) hours. Take with your dose of reglan 01/03/16  Yes Charlesetta Shanks, MD  ferrous sulfate 325 (65 FE) MG tablet Take 1 tablet (325 mg total) by mouth daily with breakfast. 12/31/14  Yes Josalyn Funches, MD  FOLIC ACID PO Take 1 tablet by mouth every morning.   Yes Historical Provider, MD  furosemide (LASIX) 40 MG tablet Take 1 tablet (40 mg total) by mouth every morning. 03/27/15  Yes Josalyn Funches, MD  gabapentin (NEURONTIN) 400 MG capsule Take 2 capsules (800 mg total) by mouth 3 (three) times daily. 01/11/16  Yes Josalyn Funches, MD  insulin aspart (NOVOLOG) 100 UNIT/ML injection 90 U at breakfast, 90 U at lunch, 100 U at evening meal 12/21/15  Yes Josalyn Funches, MD  Insulin Glargine (LANTUS SOLOSTAR) 100 UNIT/ML Solostar Pen Inject 100 Units into the skin at bedtime. 12/21/15  Yes Josalyn Funches, MD  Insulin Pen Needle (B-D ULTRAFINE III SHORT PEN) 31G X 8 MM MISC 1 application by Does not apply route daily. 03/27/15  Yes Josalyn Funches, MD  Insulin Syringe-Needle U-100 (BD INSULIN SYRINGE ULTRAFINE) 31G X 15/64" 1 ML MISC 1 each by Does not apply route 3 (three) times daily. 03/27/15  Yes Josalyn Funches, MD  losartan (COZAAR) 100 MG tablet Take 1 tablet (100 mg  total) by mouth every morning. 08/24/15  Yes Boykin Nearing, MD  metFORMIN (GLUCOPHAGE) 1000 MG tablet Take 1 tablet (1,000 mg total) by mouth 2 (two) times daily with a meal. 12/21/15  Yes Josalyn Funches, MD  methocarbamol (ROBAXIN) 750 MG tablet Take 1 tablet (750 mg total) by mouth every 8 (eight) hours as needed for muscle spasms. 01/11/16  Yes Josalyn Funches, MD  metoCLOPramide (REGLAN) 10 MG tablet Take 1 tablet (10 mg total) by mouth every 6 (six) hours. 01/11/16  Yes Josalyn Funches, MD  mometasone-formoterol (DULERA) 100-5 MCG/ACT AERO Take 2 puffs first thing in am and then another 2 puffs about 12 hours later. 07/13/15  Yes Christena Deem  [place], MD  montelukast (SINGULAIR) 10 MG tablet Take 1 tablet (10 mg total) by mouth at bedtime. 03/27/15  Yes Boykin Nearing, MD  omeprazole (PRILOSEC) 20 MG capsule Take 1 capsule (20 mg total) by mouth daily before breakfast. 01/11/16  Yes Boykin Nearing, MD  [place] Calcium 500 MG TABS Take 0.4 tablets (500 mg total) by mouth 2 (two) times daily after a meal. 03/27/15  Yes Josalyn Funches, MD  PARoxetine (PAXIL) 20 MG tablet Take 1 tablet (20 mg total) by mouth daily. Patient taking differently: Take 20 mg by mouth every morning.  09/25/15  Yes Josalyn Funches, MD  predniSONE (DELTASONE) 1 MG tablet Take 4 mg by mouth daily with breakfast.    Yes Historical Provider, MD  ranitidine (ZANTAC) 300 MG tablet Take 1 tablet (300 mg total) by mouth at bedtime. 01/11/16  Yes Josalyn Funches, MD  verapamil (VERELAN PM) 360 MG 24 hr capsule Take 1 capsule (360 mg total) by mouth every morning. 03/27/15  Yes Josalyn Funches, MD  Betamethasone Valerate 0.12 % foam Apply 1 application topically 2 (two) times daily. To scalp 01/11/16   Boykin Nearing, MD  Blood Glucose Monitoring Suppl (ACCU-CHEK AVIVA PLUS) W/DEVICE KIT Used as directed 11/05/14   Boykin Nearing, MD  dextromethorphan-guaiFENesin (MUCINEX DM) 30-600 MG 12hr tablet Take 1 tablet by mouth 2 (two) times daily as  needed (with flutter valve for cough and congestion).    Historical Provider, MD  glucose blood (RELION GLUCOSE TEST STRIPS) test strip 1 each by Other route 4 (four) times daily. ICD code E11.65 07/09/15   Boykin Nearing, MD  Lancet Devices [facility]) lancets Use as instructed 04/03/15   Boykin Nearing, MD  lidocaine (LIDODERM) 5 % Place 1 patch onto the skin daily. Remove & Discard patch within 12 hours or as directed by MD 01/11/16   Boykin Nearing, MD  mupirocin cream (BACTROBAN) 2 % Apply 1 application topically 3 (three) times daily. 11/05/15   Boykin Nearing, MD  ReliOn Ultra Thin Lancets MISC 1 each by Does not apply route 4 (four) times daily. ICD code E11.65 03/27/15   Boykin Nearing, MD  Respiratory Therapy Supplies (FLUTTER) DEVI Use as directed 09/16/14   Tanda Rockers, MD  Flossie Buffy MICRO PEN NEEDLES 32G X 4 MM MISC USE AS DIRECTED 09/08/15   Boykin Nearing, MD   Meds Ordered and Administered this Visit  Medications - No data to display  BP 112/59 mmHg  Pulse 79  Temp(Src) 97.6 F (36.4 C) (Oral)  SpO2 99%  LMP 01/10/2016 (Exact Date) No data found.   Physical Exam  Constitutional: She is oriented to person, place, and time. She appears well-developed and well-nourished. No distress.  HENT:  Head: Normocephalic and atraumatic.  Nose: Nose normal.  Mouth/Throat: Oropharynx is clear and moist.  Eyes: Conjunctivae are normal. Pupils are equal, round, and reactive to light. Right eye exhibits no discharge. Left eye exhibits no discharge. No scleral icterus.  Cardiovascular: Normal rate, regular rhythm, normal heart sounds and intact distal pulses.   No murmur heard. Pulmonary/Chest: Effort normal and breath sounds normal. No respiratory distress. She has no wheezes. She has no rales.  Abdominal: Soft. Bowel sounds are normal. She exhibits no distension. There is no tenderness.  Musculoskeletal:       Right lower leg: She exhibits tenderness and swelling. She  exhibits no bony tenderness, no deformity and no laceration.       Legs: Neurological: She is alert and oriented to person,  place, and time.  Skin: Skin is warm and dry. She is not diaphoretic. No erythema.  Psychiatric: She has a normal mood and affect. Her behavior is normal.  Nursing note and vitals reviewed.   ED Course  Procedures (including critical care time)  Labs Review Labs Reviewed - No data to display  Imaging Review Dg Ankle Complete Right  01/15/2016  CLINICAL DATA:  Anterior right ankle pain beginning yesterday. EXAM: RIGHT ANKLE - COMPLETE 3+ VIEW COMPARISON:  None. FINDINGS: No acute bone or soft tissue abnormality is present. The ankle joint is located. Microvascular calcifications are present about the ankle, compatible with diabetes. IMPRESSION: No acute abnormality. Electronically Signed   By: San Morelle M.D.   On: 01/15/2016 16:31      MDM   1. Leg pain, anterior, right    Given high fracture risk in patient with osteoporosis and vertebral compression fractures, an xray was obtained despite absence of trauma. Xray negative. Rec RICE and f/u with PCP in 1 week. Continue home meds for pain.  Patient discussed with and examined by Dr. Bridgett Larsson who agrees with assessment and plan.     Frazier Richards, MD 01/15/16 [phone]

## 2016-01-15 NOTE — Discharge Instructions (Signed)
RHCE para los cuidados de rutina de las lesiones (RICE for Routine Care of Injuries) Los cuidados de rutina de muchas lesiones incluyen reposo, hielo, compresin y elevacin (RHCE). A menudo se recomienda la estrategia de RHCE para las lesiones de los tejidos blandos, por ejemplo, una distensin muscular, las lesiones de los ligamentos, los hematomas y las lesiones por uso excesivo. Tambin se puede utilizar para [employer]. El uso de la Cytogeneticist de RHCE puede ayudar a Best boy, reducir la hinchazn y permitir que el cuerpo se recupere. Reposo El reposo es necesario para permitir que el cuerpo se recupere. Habitualmente, esto implica reducir las actividades normales y Product/process development scientist el uso de la zona lesionada del cuerpo. Por lo general, puede reanudar sus actividades normales cuando se siente cmodo y el mdico se lo ha autorizado. Hielo Aplicar hielo en una lesin sirve para evitar la hinchazn y Therapist, art. No aplique el hielo directamente sobre la piel.  Ponga el hielo en una bolsa plstica.  Coloque una toalla entre la piel y la bolsa de hielo.  Coloque el hielo durante 20 minutos, 2 a 3 veces por da. Hgalo durante el tiempo que el mdico se lo haya indicado. Compresin La compresin implica ejercer presin sobre la zona lesionada. La compresin ayuda a evitar la hinchazn, brinda sujecin y [place] con las molestias. Una venda elstica sirve para la compresin. Si tiene Management consultant, siga estos consejos generales:  Qutese y vuelva a colocarse la venda cada 3 o 4horas, o como se lo haya indicado el mdico.  Asegrese de que la venda no est muy Eugenio Saenz, ya que esto puede cortarle la circulacin. Si una parte del cuerpo ms all de la venda se torna color azul, se adormece, se enfra, se hincha o le causa ms dolor, es probable que la venda est [place]. Si eso ocurre, retire la venda y vuelva a colocarla ms floja.  Consulte al mdico si la venda parece  estar agravando los problemas en lugar de mejorndolos. Elevacin La elevacin implica mantener elevada la zona lesionada. Esto ayuda a Best boy y Forensic psychologist. Si es posible, la zona lesionada debe elevarse al nivel del corazn o del centro del pecho, o por encima de [place]. [place]? Debe buscar atencin mdica en las siguientes situaciones:  El dolor y la hinchazn continan.  Los sntomas empeoran en vez de Teacher, English as a foreign language. Estos sntomas pueden indicar que es necesaria una evaluacin ms profunda o nuevas radiografas. En algunos casos, las radiografas no muestran si hay un hueso pequeo roto (fractura) hasta varios das ms tarde. Concurra a las citas de control con el [place]? Solicite atencin [employer] en las siguientes situaciones:  Siente un dolor intenso repentino en la zona de la lesin o por debajo de esta.  Tiene enrojecimiento o aumenta la hinchazn alrededor de la lesin.  Tiene hormigueo o adormecimiento en la zona de la lesin o por debajo de esta que no mejoran despus de quitarse la venda Counsellor.   Esta informacin no tiene Marine scientist el consejo del mdico. Asegrese de hacerle al mdico cualquier pregunta que tenga.   Document Released: 06/08/2005 Document Revised: 09/03/2013 Elsevier Interactive Patient Education [employer].

## 2016-01-17 ENCOUNTER — Telehealth: Payer: Self-pay | Admitting: Family Medicine

## 2016-01-17 DIAGNOSIS — M549 Dorsalgia, unspecified: Principal | ICD-10-CM

## 2016-01-17 DIAGNOSIS — G8929 Other chronic pain: Secondary | ICD-10-CM

## 2016-01-18 ENCOUNTER — Ambulatory Visit (HOSPITAL_COMMUNITY)
Admission: RE | Admit: 2016-01-18 | Discharge: 2016-01-18 | Disposition: A | Discharge status: Home / Self Care | Payer: Medicaid Other | Source: Ambulatory Visit | Attending: Family Medicine | Admitting: Family Medicine

## 2016-01-18 DIAGNOSIS — M8580 Other specified disorders of bone density and structure, unspecified site: Secondary | ICD-10-CM | POA: Diagnosis not present

## 2016-01-18 DIAGNOSIS — G8929 Other chronic pain: Secondary | ICD-10-CM | POA: Diagnosis not present

## 2016-01-18 DIAGNOSIS — M549 Dorsalgia, unspecified: Principal | ICD-10-CM

## 2016-01-18 DIAGNOSIS — M4855XA Collapsed vertebra, not elsewhere classified, thoracolumbar region, initial encounter for fracture: Secondary | ICD-10-CM | POA: Insufficient documentation

## 2016-01-18 NOTE — Telephone Encounter (Signed)
Pam from the pre service department called stating that pt. Has an MRI tomorrow and medicaid denied her. Pam needs to know by 2:30 today if the pt. Can still be seen if not her appointment will be canceled. Please f/u

## 2016-01-18 NOTE — Telephone Encounter (Signed)
Appointment canceled with radiology and patient is aware of the cancellation and to have X-rays

## 2016-01-18 NOTE — Telephone Encounter (Signed)
Please cancel MRI  Appointment Insurance denied coverage even after reviewing records stating that x-ray was need prior  Ask patient to have thoracic/lumbar x-ray done

## 2016-01-19 ENCOUNTER — Ambulatory Visit (HOSPITAL_COMMUNITY): Admission: RE | Admit: 2016-01-19 | Payer: Medicaid Other | Source: Ambulatory Visit

## 2016-01-19 ENCOUNTER — Ambulatory Visit (HOSPITAL_COMMUNITY): Payer: Medicaid Other

## 2016-01-20 ENCOUNTER — Telehealth: Payer: Self-pay | Admitting: *Deleted

## 2016-01-20 ENCOUNTER — Other Ambulatory Visit: Payer: Self-pay | Admitting: Family Medicine

## 2016-01-20 DIAGNOSIS — M4850XS Collapsed vertebra, not elsewhere classified, site unspecified, sequela of fracture: Secondary | ICD-10-CM

## 2016-01-20 DIAGNOSIS — M4850XA Collapsed vertebra, not elsewhere classified, site unspecified, initial encounter for fracture: Secondary | ICD-10-CM | POA: Insufficient documentation

## 2016-01-20 NOTE — Telephone Encounter (Signed)
-----   Message from Boykin Nearing, MD sent at 01/20/2016 10:40 AM EDT ----- Possible new sacral compression fracture No new fractures in thoracic and lumbar spine Whole body nuclear medicine bone scan has been recommended and ordered, needs to be scheduled

## 2016-01-20 NOTE — Telephone Encounter (Signed)
Date of birth verified by pt  Results given  Possible new sacral compression Fx  Body nuclear medicine bone scan appointment;  2 part test.  Inj on Monday 15 at 11:00 return 2:00pm  [facility] admitting be there 15 min early Pt verbalized understanding  Information given in Spanish

## 2016-01-22 ENCOUNTER — Encounter: Payer: Medicaid Other | Admitting: Adult Health

## 2016-01-25 ENCOUNTER — Encounter (HOSPITAL_COMMUNITY)
Admission: RE | Admit: 2016-01-25 | Discharge: 2016-01-25 | Disposition: A | Discharge status: Home / Self Care | Payer: Medicaid Other | Source: Ambulatory Visit | Attending: Family Medicine | Admitting: Family Medicine

## 2016-01-25 ENCOUNTER — Other Ambulatory Visit: Payer: Self-pay | Admitting: Internal Medicine

## 2016-01-25 DIAGNOSIS — R948 Abnormal results of function studies of other organs and systems: Secondary | ICD-10-CM | POA: Insufficient documentation

## 2016-01-25 DIAGNOSIS — M4850XS Collapsed vertebra, not elsewhere classified, site unspecified, sequela of fracture: Secondary | ICD-10-CM | POA: Diagnosis not present

## 2016-01-25 MED ORDER — TECHNETIUM TC 99M MEDRONATE IV KIT
26.4000 | PACK | Freq: Once | INTRAVENOUS | Status: AC | PRN
  Administered 2016-01-25: 26.4 via INTRAVENOUS

## 2016-01-25 MED FILL — MONTELUKAST SOD 10 MG TAB: 10 | 30 days supply | Qty: 30 | Fill #10 | Status: TO

## 2016-01-25 MED FILL — PARoxetine HCL 20 MG TABS: 20 | 30 days supply | Qty: 30 | Fill #3 | Status: TO

## 2016-01-25 MED FILL — OYSTER SHELL CAL 500 MG TAB: 500 | 30 days supply | Qty: 60 | Fill #2 | Status: TO

## 2016-01-25 MED FILL — ATORVASTATIN 20 MG TABLET: 20 | 30 days supply | Qty: 30 | Fill #0 | Status: TO

## 2016-01-25 MED FILL — LOSARTAN POTASSIUM 100 MG T: 100 | 30 days supply | Qty: 30 | Fill #1 | Status: TO

## 2016-01-25 MED FILL — cloNIDine HCL 0.2 MG TABS: 0.2 | 30 days supply | Qty: 90 | Fill #0

## 2016-01-25 NOTE — Telephone Encounter (Signed)
REFILL 

## 2016-01-27 ENCOUNTER — Telehealth: Payer: Self-pay | Admitting: *Deleted

## 2016-01-27 ENCOUNTER — Other Ambulatory Visit: Payer: Self-pay | Admitting: *Deleted

## 2016-01-27 DIAGNOSIS — IMO0001 Reserved for inherently not codable concepts without codable children: Secondary | ICD-10-CM

## 2016-01-27 DIAGNOSIS — E1165 Type 2 diabetes mellitus with hyperglycemia: Principal | ICD-10-CM

## 2016-01-27 MED ORDER — INSULIN ASPART 100 UNIT/ML ~~LOC~~ SOLN: SUBCUTANEOUS | Status: DC

## 2016-01-27 MED ORDER — INSULIN GLARGINE 100 UNIT/ML SOLOSTAR PEN: 100.0000 [IU] | PEN_INJECTOR | Freq: Every day | SUBCUTANEOUS | Status: DC

## 2016-01-27 NOTE — Telephone Encounter (Signed)
Pharmacy was called for verification Rx Klonopin  Pt stated Rx was not received per pharmacy  Rx was received, unable to refilled to June 1. Pharmacy requesting changes on Novolog to #8 and Lantus #10  Stated not enough medication for 1 month, new Rx send    Pt notified Rx Klonopin not refill till June 1

## 2016-01-28 ENCOUNTER — Ambulatory Visit (INDEPENDENT_AMBULATORY_CARE_PROVIDER_SITE_OTHER): Payer: Medicaid Other | Admitting: Adult Health

## 2016-01-28 ENCOUNTER — Encounter: Payer: Self-pay | Admitting: Adult Health

## 2016-01-28 ENCOUNTER — Telehealth: Payer: Self-pay | Admitting: Internal Medicine

## 2016-01-28 VITALS — BP 130/82 | HR 84 | Temp 98.3°F | Ht <= 58 in | Wt 180.0 lb

## 2016-01-28 DIAGNOSIS — J45998 Other asthma: Secondary | ICD-10-CM

## 2016-01-28 NOTE — Telephone Encounter (Signed)
Spoke with pt and explained that per office policy we will get permission from both providers to make the switch.  Pt verbalized understanding. Dr Melvyn Novas, are you ok with the change to Dr [place] Bells?

## 2016-01-28 NOTE — Patient Instructions (Addendum)
Automatically take  the dulera 100 Take 2 puffs first thing in am and then another 2 puffs about 12 hours later.   Only use your albuterol (proair) as a rescue medication to be used if you can't catch your breath by resting or doing a relaxed purse lip breathing pattern.  - The less you use it, the better it will work when you need it. - Ok to use up to 2 puffs  every 4 hours if you must but call for immediate appointment if use goes up over your usual need - Don't leave home without it !!  (think of it like the spare tire for your car)   Only use albuterol nebulizer after you first try the Proair if it fails to work   Avoid mint products including gum  Work on inhaler technique:  relax and gently blow all the way out then take a nice smooth deep breath back in, triggering the inhaler at same time you start breathing in.  Hold for up to 5 seconds if you can. Blow out thru nose. Rinse and gargle with water when done  For drainage / throat tickle try take CHLORPHENIRAMINE  4 mg - two @  Bedtime- [facility] .             Tomar automticamente la dulera 100 [place] 2 bocanadas primera vez en la maana y luego otra 2 bocanadas aproximadamente 12 horas ms tarde.  Utilice solamente su albuterol (proair) como una medicacin del rescate a ser utilizado si usted no puede coger su respiracin descansando o haciendo un patrn de respiracin relajado del labio del monedero. - Cuanto menos lo utilice, mejor funcionar cuando lo necesite. - Ok para usar hasta 2 bocanadas cada 4 horas si es necesario, pero llamar para una cita inmediata si el uso sube por encima de su necesidad habitual - No salgas de casa sin ella! (Piense en l como la rueda de repuesto para su coche)  Slo utilice nebulizador de albuterol despus de Retail buyer si no funciona  Evite los productos de menta incluyendo goma  Trabaje en la tcnica del inhalador: reljese y suavemente soplar todo el camino  hacia fuera entonces toma una respiracin profunda lisa agradable detrs adentro, activando el inhalador al mismo tiempo usted comienza a Marketing executive. Sostngase por hasta 5 segundos si usted puede. Soplar a travs de Mudlogger. Enjuague y haga grgaras con agua cuando termine  Para el drenaje / garganta cosquillas trate de tomar CHLORPHENIRAMINE 4 mg - dos @ Bedtime              Follow med calendar closely and bring to each visit.  Siga de cerca el calendario de medicina y llevar a cada visita.  Follow up in 2-3 months and .As needed

## 2016-01-28 NOTE — Progress Notes (Signed)
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  Onset was childhood but continued to have "good days and bad days" until 2010 and no good days since then even p pred started and no better since arrived in [place] summer 2015 with no evidence of any airflow obst by pfts 11/03/14    History of Present Illness  09/04/2014 1st [facility] office visit/ [facility]   Chief Complaint  Patient presents with  . Advice Only    Referred for chronic bronchitis. Recently moved from [place].   Every day's about the same since 2012 admitted to [facility] every month but not since arrival in us/   maintaining on prednisone ? Dose plus theoph/ brethine/ advair 500 and nebs "all day long"  But interestingly less symptoms nocturnally. Cough is mostly dry assoc with hoarseness  rec Stop the advair, the brethine  and the theophylline Start dulera 100 Take 2 puffs first thing in am and then another 2 puffs about 12 hours later.  Continue prilosec 20 mg Take 30- 60 min before your first and last meals of the day  GERD diet  Only use your albuterol nebulizer  If needed  If you find you don't need the nebulizer as much, then we can start reducing your prednisone by 10 mg a week   09/16/2014 f/u ov/Wert re: psueudoasthma vs asthma back on theoph/brethine and pred 40  Chief Complaint  Patient presents with  . HFU    Pt states that her breathing has improved some, but not back to baseline. SHe is still coughing up yellow sputum.    using neb saba avg twice daily, feels theoph helps with "congestion" but able to do adls ok   >d/c theophylline/breathine    11/03/2014 f/u ov/Wert re: dtca asthma/ no rx today/ pred still at 30 mg  Chief Complaint    Patient presents with  . Follow-up    PFT done today. Pt states that her cough has resolved. Her breathing is some better. No new co's today.   still badly misunderstanding how to take her meds / unable to doing meaningful or accurate med rec  >>decrease pred 20    12/15/2014 f/u ov/Wert re: chronic asthma/ pseudoasthma / brought med calendar in [place] condition  Chief Complaint  Patient presents with  . Follow-up    Pt states still feeling SOB especially with movement. Denies fever, cough, or chest tightness   on prednisone 20 mg  Plus neb 3 tid including am of ov due to variable sob day >>noct  rec Plan A = automatic = dulera 100 Take 2 puffs first thing in am and then another 2 puffs about 12 hours later.  Work on inhaler technique:  relax and gently blow all the way out then take a nice smooth deep breath back in, triggering the inhaler at same time you start breathing in.  Hold for up to 5 seconds if you can.  Rinse and gargle with water when done Plan B = backup Only use your albuterol [place] as a rescue medication  Plan C = neb albuterol, only use it if try B first and it doesn't work Continue  prednisone to  20 mg daily    07/13/2015  f/u ov/Wert re: asthma vs vcd  On dulera 200 and pred 4mg  daily  Chief Complaint  Patient presents with  . Follow-up    Pt states breathing is "so, so". She had to increase the Dulera from 100 mcg to 200 mcg approx 2 wks ago, and she is using albuterol inhaler 3 x per day on average. She has been using neb every night.   Over using saba the same even on higher dose of dulera but more hoarse rec Resume the dulera 100 Take 2 puffs first thing in am and then another 2 puffs about 12 hours later.  Only use your albuterol (proair) as a rescue medication  Only use the nebulized albuterol if you use the proair and it fails to relieve breathing difficulty Use the tylenol #3 as per med calendar to suppress cough    12/11/2015  f/u ov/Wert re:  Chronic  asthma/ dtc / no med calendar/ no rescue/ not using dulera as rec 1st thing in am  Chief Complaint  Patient presents with  . Follow-up    Breathing is unchanged. She is still wheezing some.   waking up frequently with sensation can't breath at least once a night varies bewteen 1 am and 4 am needs neb to get back to sleep >add chlor tabs At bedtime    01/28/2016 Follow up : Chronic asthma  Pt returns for 6 week follow up . Interpretor present for visit.  Says her breathing is doing well and has no new complaints at this time.  We reviewed her med list with meds today , she did not get chlor tabs as recommended last ov  She says she will got to [facility] to pick up today.  Appears to be taking her meds correctly.  She denies chest pain, orthopnea, edema or fever.    Current Medications, Allergies, Complete Past Medical History, Past Surgical History, Family History, and Social History were reviewed in [employer] record.  ROS  The following are not active complaints unless bolded sore throat, dysphagia, dental problems, itching, sneezing,  nasal congestion or excess/ purulent secretions, ear ache,   fever, chills, sweats, unintended wt loss, classically pleuritic or exertional cp,  orthopnea pnd or leg swelling, presyncope, palpitations, abdominal pain, anorexia, nausea, vomiting, diarrhea  or change in bowel or bladder habits, change in stools or urine, dysuria,hematuria,  rash, arthralgias, visual complaints, headache, numbness, weakness or ataxia or problems with walking or coordination,  change in mood/affect or memory.                  Objective:   Physical Exam   Mildly hoarse  very cushingnoid amb hispanic female nad   01/12/2015  186 > 177 02/17/2015 >  03/31/2015  181 >180 >180 05/12/2015 > 07/13/2015   182 > 12/11/2015    182    Vital signs reviewed    Filed Vitals:   01/28/16 1418  BP: 130/82  Pulse: 84  Temp: 98.3 F (36.8 C)  TempSrc: Oral    Height: 4\' 10"  (1.473 m)  Weight: 180 lb (81.647 kg)  SpO2: 98%       HEENT: nl dentition, turbinates, and orophanx. Nl external ear canals without cough reflex   NECK :  without JVD/Nodes/TM/ nl carotid upstrokes bilaterally   LUNGS: no acc muscle use, clear to A and P bilaterally without cough on insp or exp maneuvers -   CV:  RRR  no s3 or murmur or increase in P2, no edema   ABD:  soft and nontender with nl excursion in the supine position. No bruits or organomegaly, bowel sounds nl  MS:  warm without deformities, calf tenderness, cyanosis or clubbing Back brace  SKIN: warm and dry without lesions     Jemmie Ledgerwood NP-C  [facility]   01/28/2016        Assessment & Plan:

## 2016-01-28 NOTE — Assessment & Plan Note (Signed)
Compensated on present regimen  Would start on chlor tab as AR is trigger for her in past.  Med reviewed and calendar confirmed.  Cont on current regimen  Follow up in 2-3 months and As needed

## 2016-01-28 NOTE — Progress Notes (Signed)
Chart and office note reviewed in detail  > agree with a/p as outlined    

## 2016-01-28 NOTE — Telephone Encounter (Signed)
Yes fine with me 

## 2016-01-29 NOTE — Telephone Encounter (Signed)
OK by me 

## 2016-01-29 NOTE — Telephone Encounter (Signed)
Routing to BQ for MD switch approval.   BW please advise if you are ok for pt to start seeing you. Thanks!

## 2016-02-01 NOTE — Telephone Encounter (Signed)
lmtcb X1 to schedule pt for next available 30-min appt with BQ

## 2016-02-02 NOTE — Telephone Encounter (Signed)
Patient called -returning our call. Advised that we received approval to schedule her with Dr. Lake Bells. I gave her the appointment date and time - pt was satisfied. -prm

## 2016-02-02 NOTE — Telephone Encounter (Signed)
Attempted to call pt. Line was picked up but no once came to the line. Will try back.

## 2016-02-09 ENCOUNTER — Other Ambulatory Visit: Payer: Self-pay | Admitting: Family Medicine

## 2016-02-09 DIAGNOSIS — L309 Dermatitis, unspecified: Secondary | ICD-10-CM

## 2016-02-09 MED ORDER — FLUTICASONE PROPIONATE 0.05 % EX CREA: TOPICAL_CREAM | Freq: Two times a day (BID) | CUTANEOUS | Status: DC

## 2016-02-10 MED FILL — VERAPAMIL 360 MG CAP PELLET: 360 | 30 days supply | Qty: 30 | Fill #3 | Status: TO

## 2016-02-16 ENCOUNTER — Ambulatory Visit: Payer: Medicaid Other | Admitting: Podiatry

## 2016-02-25 ENCOUNTER — Ambulatory Visit: Payer: Medicaid Other | Attending: Family Medicine | Admitting: Family Medicine

## 2016-02-25 ENCOUNTER — Encounter: Payer: Self-pay | Admitting: Family Medicine

## 2016-02-25 VITALS — BP 115/72 | HR 67 | Temp 98.1°F | Resp 16 | Ht <= 58 in | Wt 185.0 lb

## 2016-02-25 DIAGNOSIS — Z7982 Long term (current) use of aspirin: Secondary | ICD-10-CM | POA: Diagnosis not present

## 2016-02-25 DIAGNOSIS — Z79899 Other long term (current) drug therapy: Secondary | ICD-10-CM | POA: Insufficient documentation

## 2016-02-25 DIAGNOSIS — M818 Other osteoporosis without current pathological fracture: Secondary | ICD-10-CM

## 2016-02-25 DIAGNOSIS — B351 Tinea unguium: Secondary | ICD-10-CM | POA: Diagnosis not present

## 2016-02-25 DIAGNOSIS — G8929 Other chronic pain: Secondary | ICD-10-CM | POA: Insufficient documentation

## 2016-02-25 DIAGNOSIS — T380X5A Adverse effect of glucocorticoids and synthetic analogues, initial encounter: Secondary | ICD-10-CM

## 2016-02-25 DIAGNOSIS — K7581 Nonalcoholic steatohepatitis (NASH): Secondary | ICD-10-CM | POA: Insufficient documentation

## 2016-02-25 DIAGNOSIS — L03311 Cellulitis of abdominal wall: Secondary | ICD-10-CM | POA: Diagnosis not present

## 2016-02-25 DIAGNOSIS — Z7984 Long term (current) use of oral hypoglycemic drugs: Secondary | ICD-10-CM | POA: Diagnosis not present

## 2016-02-25 DIAGNOSIS — Z794 Long term (current) use of insulin: Secondary | ICD-10-CM | POA: Diagnosis not present

## 2016-02-25 DIAGNOSIS — M549 Dorsalgia, unspecified: Secondary | ICD-10-CM

## 2016-02-25 DIAGNOSIS — M4850XS Collapsed vertebra, not elsewhere classified, site unspecified, sequela of fracture: Secondary | ICD-10-CM

## 2016-02-25 DIAGNOSIS — Z6838 Body mass index (BMI) 38.0-38.9, adult: Secondary | ICD-10-CM | POA: Insufficient documentation

## 2016-02-25 DIAGNOSIS — M79676 Pain in unspecified toe(s): Secondary | ICD-10-CM

## 2016-02-25 MED ORDER — MUPIROCIN CALCIUM 2 % EX CREA: 1.0000 "application " | TOPICAL_CREAM | Freq: Three times a day (TID) | CUTANEOUS | Status: DC

## 2016-02-25 NOTE — Progress Notes (Signed)
F/U abdominal pain  Lab work  Pain scale # 6 No suicidal thoughts in the past two weeks

## 2016-02-25 NOTE — Patient Instructions (Addendum)
Raven Turner was seen today for follow-up.  Diagnoses and all orders for this visit:  Cellulitis of abdominal wall -     mupirocin cream (BACTROBAN) 2 %; Apply 1 application topically 3 (three) times daily.  Steroid-induced osteoporosis -     Comprehensive metabolic panel  NASH (nonalcoholic steatohepatitis) -     Comprehensive metabolic panel  Pain due to onychomycosis of toenail -     COMPLETE METABOLIC PANEL WITH GFR   Please go to the [facility] and apply for a membership, there are discount memberships and schlarships avaialble You are in need of low impact exercise therapy for weight loss to help with your chronic back pain and for your fatty liver.   I recommend pool aerobics  F/u in  6 weeks for weight check and diabetes follow up   Dr. Adrian Blackwater

## 2016-02-25 NOTE — Progress Notes (Signed)
Subjective:  Patient ID: Raven Turner, female    DOB: 1971-08-31  Age: 45 y.o. MRN: [id number] Spanish interpreter ID # [id number]   CC: Follow-up   HPI Raven Turner has steroid induced osteoporosis and Cushing syndrome, morbid obesity, fatty liver,  GERD, HTN, DM, Thoracic and lumbar compression fractures presents for   1. f/u upper abdominal pain: pain has improved a bit. She still has some pain and nausea. She takes reglan. She is not exercising. She has gained weight. No fever or chills. No emesis.  2. Chronic back pain: she has known thoracic and lumbar compression fractures. Her previous brace has worn out. She request Rx for a new brace. She is over due to another fosamax infusion. She is taking calcium and vitamin D.    Social History  Substance Use Topics  . Smoking status: Never Smoker   . Smokeless tobacco: Never Used  . Alcohol Use: No   Outpatient Prescriptions Prior to Visit  Medication Sig Dispense Refill  . acetaminophen-codeine (TYLENOL #3) 300-30 MG tablet TAKE 1 TO 2 TABLETS BY MOUTH AT BEDTIME AS NEEDED FOR MODERATE PAIN (Patient taking differently: TAKE 1 TO 2 TABLETS BY MOUTH AT BEDTIME AS NEEDED FOR SEVERE PAIN OR COUGH) 60 tablet 2  . albuterol (PROVENTIL HFA;VENTOLIN HFA) 108 (90 Base) MCG/ACT inhaler Inhale 2 puffs into the lungs every 4 (four) hours as needed for wheezing or shortness of breath (((PLAN B))).    Marland Kitchen albuterol (PROVENTIL) (2.5 MG/3ML) 0.083% nebulizer solution Take 3 mLs (2.5 mg total) by nebulization every 4 (four) hours as needed for wheezing or shortness of breath (plan C). 75 mL 3  . aspirin 325 MG tablet Take 325 mg by mouth every morning.    Marland Kitchen atorvastatin (LIPITOR) 20 MG tablet Take 20 mg by mouth daily at 6 PM.    . Blood Glucose Monitoring Suppl (ACCU-CHEK AVIVA PLUS) W/DEVICE KIT Used as directed 1 kit 0  . chlorpheniramine (CHLOR-TRIMETON) 4 MG tablet Take 2 tablets by mouth at bedtime    . cholecalciferol (VITAMIN D)  1000 UNITS tablet Take 1,000 Units by mouth every morning.    . clonazePAM (KLONOPIN) 0.5 MG tablet Take 1 tablet (0.5 mg total) by mouth 2 (two) times daily as needed (trouble sleeping and anxiety). 60 tablet 2  . cloNIDine (CATAPRES) 0.2 MG tablet Take 1 tablet (0.2 mg total) by mouth 3 (three) times daily. NEED OV. 90 tablet 0  . dextromethorphan-guaiFENesin (MUCINEX DM) 30-600 MG 12hr tablet Take 1 tablet by mouth 2 (two) times daily as needed (with flutter valve for cough and congestion).    . diphenhydrAMINE (BENADRYL) 25 MG tablet Take 1 tablet (25 mg total) by mouth every 6 (six) hours. Take with your dose of reglan 30 tablet 0  . ferrous sulfate 325 (65 FE) MG tablet Take 1 tablet (325 mg total) by mouth daily with breakfast. 30 tablet 3  . fluticasone (CUTIVATE) 0.05 % cream Apply topically 2 (two) times daily. 30 g 0  . FOLIC ACID PO Take 1 tablet by mouth every morning.    . furosemide (LASIX) 40 MG tablet Take 1 tablet (40 mg total) by mouth every morning. 30 tablet 2  . gabapentin (NEURONTIN) 400 MG capsule Take 2 capsules (800 mg total) by mouth 3 (three) times daily. 180 capsule 5  . glucose blood (RELION GLUCOSE TEST STRIPS) test strip 1 each by Other route 4 (four) times daily. ICD code E11.65 400 each 3  . insulin aspart (NOVOLOG)  100 UNIT/ML injection 90 U at breakfast, 90 U at lunch, 100 U at evening meal (Patient taking differently: 80 U at breakfast, 80 U at lunch, 80 U at evening meal) 8 vial 3  . Insulin Glargine (LANTUS SOLOSTAR) 100 UNIT/ML Solostar Pen Inject 100 Units into the skin at bedtime. (Patient taking differently: Inject 96 Units into the skin at bedtime. ) 10 pen 11  . Insulin Pen Needle (B-D ULTRAFINE III SHORT PEN) 31G X 8 MM MISC 1 application by Does not apply route daily. 100 each 3  . Insulin Syringe-Needle U-100 (BD INSULIN SYRINGE ULTRAFINE) 31G X 15/64" 1 ML MISC 1 each by Does not apply route 3 (three) times daily. 300 each 3  . Lancet Devices (ACCU-CHEK  SOFTCLIX) lancets Use as instructed 1 each 0  . lidocaine (LIDODERM) 5 % Place 1 patch onto the skin daily. Remove & Discard patch within 12 hours or as directed by MD 30 patch 0  . losartan (COZAAR) 100 MG tablet Take 1 tablet (100 mg total) by mouth every morning. 30 tablet 3  . metFORMIN (GLUCOPHAGE) 1000 MG tablet Take 1 tablet (1,000 mg total) by mouth 2 (two) times daily with a meal. 60 tablet 11  . methocarbamol (ROBAXIN) 750 MG tablet Take 1 tablet (750 mg total) by mouth every 8 (eight) hours as needed for muscle spasms. 90 tablet 11  . methylphenidate (RITALIN LA) 10 MG 24 hr capsule Take 10 mg by mouth daily.    . metoCLOPramide (REGLAN) 10 MG tablet Take 1 tablet (10 mg total) by mouth every 6 (six) hours. 30 tablet 3  . mometasone-formoterol (DULERA) 100-5 MCG/ACT AERO Take 2 puffs first thing in am and then another 2 puffs about 12 hours later. 1 Inhaler 11  . montelukast (SINGULAIR) 10 MG tablet Take 1 tablet (10 mg total) by mouth at bedtime. 30 tablet 11  . mupirocin cream (BACTROBAN) 2 % Apply 1 application topically 3 (three) times daily. 15 g 0  . omeprazole (PRILOSEC) 20 MG capsule Take 1 capsule (20 mg total) by mouth daily before breakfast. 30 capsule 11  . Oyster Shell Calcium 500 MG TABS Take 0.4 tablets (500 mg total) by mouth 2 (two) times daily after a meal. 60 tablet 11  . PARoxetine (PAXIL) 20 MG tablet Take 1 tablet (20 mg total) by mouth daily. (Patient taking differently: Take 20 mg by mouth every morning. ) 30 tablet 5  . predniSONE (DELTASONE) 1 MG tablet Take 4 mg by mouth daily with breakfast.     . ranitidine (ZANTAC) 300 MG tablet Take 1 tablet (300 mg total) by mouth at bedtime. 30 tablet 5  . [employer] Ultra Thin Lancets MISC 1 each by Does not apply route 4 (four) times daily. ICD code E11.65 400 each 3  . Respiratory Therapy Supplies (FLUTTER) DEVI Use as directed 1 each 0  . ULTICARE MICRO PEN NEEDLES 32G X 4 MM MISC USE AS DIRECTED 100 each 2  . verapamil  (VERELAN PM) 360 MG 24 hr capsule Take 1 capsule (360 mg total) by mouth every morning. 30 capsule 11   Facility-Administered Medications Prior to Visit  Medication Dose Route Frequency Provider Last Rate Last Dose  . DOBUTamine (DOBUTREX) 1,000 mcg/mL in dextrose 5% 250 mL infusion  30 mcg/kg/min Intravenous Continuous Mihai Croitoru, MD 145.3 mL/hr at 04/22/15 1300 30 mcg/kg/min at 04/22/15 1300    ROS Review of Systems  Constitutional: Negative for fever and chills.  HENT: Negative for congestion.  Eyes: Negative for visual disturbance.  Respiratory: Negative for cough and shortness of breath.   Cardiovascular: Negative for chest pain.  Gastrointestinal: Positive for nausea and abdominal pain. Negative for vomiting, diarrhea, constipation, blood in stool, abdominal distention, anal bleeding and rectal pain.  Musculoskeletal: Positive for myalgias, back pain and arthralgias.  Skin: Positive for rash. Negative for wound.  Allergic/Immunologic: Negative for immunocompromised state.  Hematological: Negative for adenopathy. Does not bruise/bleed easily.  Psychiatric/Behavioral: Negative for suicidal ideas and dysphoric mood.    Objective:  BP 115/72 mmHg  Pulse 67  Temp(Src) 98.1 F (36.7 C) (Oral)  Resp 16  Ht _0  (1.473 m)  Wt 185 lb (83.915 kg)  BMI 38.68 kg/m2  SpO2 98%  LMP 01/25/2016  BP/Weight 02/25/2016 2/95/6213 0/04/6577  Systolic BP 469 629 528  Diastolic BP 72 82 59  Wt. (Lbs) 185 180 -  BMI 38.68 37.63 -    Physical Exam  Constitutional: She is oriented to person, place, and time. She appears well-developed and well-nourished. No distress.  HENT:  Head: Normocephalic and atraumatic.  Cardiovascular: Normal rate, regular rhythm, normal heart sounds and intact distal pulses.   Pulmonary/Chest: Effort normal and breath sounds normal. No respiratory distress. She has no wheezes. She has no rales. She exhibits no tenderness.  Abdominal: Soft. Bowel sounds are  normal. She exhibits no distension and no mass. There is tenderness in the right upper quadrant and epigastric area. There is no rebound and no guarding.    Musculoskeletal: She exhibits no edema.       Thoracic back: She exhibits tenderness, bony tenderness and pain. She exhibits normal range of motion, no edema, no deformity, no laceration, no spasm and normal pulse.       Lumbar back: She exhibits tenderness, bony tenderness and pain. She exhibits normal range of motion, no swelling, no laceration and normal pulse.  Neurological: She is alert and oriented to person, place, and time.  Skin: Skin is warm and dry. No rash noted.  Psychiatric: She has a normal mood and affect.    Lab Results  Component Value Date   HGBA1C 8.0 12/21/2015   CBG  Assessment & Plan:   Lacy was seen today for follow-up.  Diagnoses and all orders for this visit:  Cellulitis of abdominal wall -     mupirocin cream (BACTROBAN) 2 %; Apply 1 application topically 3 (three) times daily.  Steroid-induced osteoporosis -     Comprehensive metabolic panel  NASH (nonalcoholic steatohepatitis) -     Comprehensive metabolic panel  Pain due to onychomycosis of toenail -     COMPLETE METABOLIC PANEL WITH GFR  Morbid obesity, unspecified obesity type ([place])  Non-traumatic compression fracture of vertebral column, sequela  Chronic back pain   No orders of the defined types were placed in this encounter.    Follow-up: No Follow-up on file.   Boykin Nearing MD

## 2016-02-25 NOTE — Assessment & Plan Note (Signed)
Chronic pain New RX for LSO with steels stays ordered Patient advised to take order to [facility]

## 2016-02-25 NOTE — Assessment & Plan Note (Signed)
Fatty liver Patient desperately needs to lose weight Advised she go to [facility] for water exercises

## 2016-02-26 ENCOUNTER — Other Ambulatory Visit: Payer: Self-pay | Admitting: *Deleted

## 2016-02-26 ENCOUNTER — Telehealth: Payer: Self-pay | Admitting: Family Medicine

## 2016-02-26 DIAGNOSIS — L03311 Cellulitis of abdominal wall: Secondary | ICD-10-CM

## 2016-02-26 LAB — COMPLETE METABOLIC PANEL WITH GFR
ALT: 39 U/L — ABNORMAL HIGH (ref 6–29)
AST: 33 U/L — ABNORMAL HIGH (ref 10–30)
Albumin: 4.1 g/dL (ref 3.6–5.1)
Alkaline Phosphatase: 114 U/L (ref 33–115)
BUN: 9 mg/dL (ref 7–25)
CO2: 25 mmol/L (ref 20–31)
Calcium: 9.8 mg/dL (ref 8.6–10.2)
Chloride: 103 mmol/L (ref 98–110)
Creat: 0.66 mg/dL (ref 0.50–1.10)
GFR, Est African American: >89 mL/min (ref >=60)
GFR, Est Non African American: >89 mL/min (ref >=60)
Glucose, Bld: 171 mg/dL — ABNORMAL HIGH (ref 65–99)
Potassium: 4.6 mmol/L (ref 3.5–5.3)
Sodium: 142 mmol/L (ref 135–146)
Total Bilirubin: 0.3 mg/dL (ref 0.2–1.2)
Total Protein: 7.8 g/dL (ref 6.1–8.1)

## 2016-02-26 MED ORDER — MUPIROCIN CALCIUM 2 % EX CREA: 1.0000 "application " | TOPICAL_CREAM | Freq: Three times a day (TID) | CUTANEOUS | Status: DC

## 2016-02-26 MED ORDER — MUPIROCIN 2 % EX OINT: 1.0000 "application " | TOPICAL_OINTMENT | Freq: Three times a day (TID) | CUTANEOUS | Status: DC

## 2016-02-26 NOTE — Telephone Encounter (Signed)
I have switched it from the cream to the ointment.

## 2016-02-26 NOTE — Telephone Encounter (Signed)
Raven Turner from [facility] called stating that she received a fax for a mupirocin cream (BACTROBAN) 2 % prescription. Pt. Has medicaid and it is not covered by them. She would like to know if she can do the ointment. Please f/u

## 2016-03-01 ENCOUNTER — Telehealth: Payer: Self-pay | Admitting: *Deleted

## 2016-03-01 NOTE — Telephone Encounter (Signed)
-----   Message from Boykin Nearing, MD sent at 02/26/2016  8:12 AM EDT ----- Elevated blood sugar and slightly elevated liver enzymes Continue current treatment plan Work on weight loss You will be called when fosamax infusion is scheduled

## 2016-03-01 NOTE — Telephone Encounter (Signed)
Pt in our office today  Lab results given  Continue current Tx as plan  Work on [employer] loss  Pt verbalized understanding

## 2016-03-02 MED FILL — LOSARTAN POTASSIUM 100 MG T: 100 | 30 days supply | Qty: 30 | Fill #0

## 2016-03-03 ENCOUNTER — Other Ambulatory Visit: Payer: Self-pay | Admitting: Family Medicine

## 2016-03-03 DIAGNOSIS — IMO0001 Reserved for inherently not codable concepts without codable children: Secondary | ICD-10-CM

## 2016-03-03 DIAGNOSIS — E1165 Type 2 diabetes mellitus with hyperglycemia: Principal | ICD-10-CM

## 2016-03-03 MED ORDER — INSULIN ASPART 100 UNIT/ML ~~LOC~~ SOLN: 100.0000 [IU] | Freq: Three times a day (TID) | SUBCUTANEOUS | Status: DC

## 2016-03-03 MED ORDER — INSULIN GLARGINE 100 UNIT/ML SOLOSTAR PEN: 100.0000 [IU] | PEN_INJECTOR | Freq: Every day | SUBCUTANEOUS | Status: DC

## 2016-03-16 ENCOUNTER — Telehealth: Payer: Self-pay | Admitting: Family Medicine

## 2016-03-16 NOTE — Telephone Encounter (Signed)
Pt. Called stating that nurse Junious Dresser had told her that everything was ready for her to have an injection done .  The injection is done at the hospital and the nurse had told her that the hospital would call her to schedule the Appointment. Pt. Has not heard from them. Please f/u with pt.

## 2016-03-18 NOTE — Telephone Encounter (Signed)
Pt. Called stating that nurse Junious Dresser had told her that everything was ready for her to have an injection done .  The injection is done at the hospital and the nurse had told her that the hospital would call her to schedule the Appointment. Pt. Has not heard from them. Please f/u with pt.

## 2016-03-18 NOTE — Telephone Encounter (Signed)
Dr. Adrian Blackwater do you know anything about this?

## 2016-03-20 ENCOUNTER — Ambulatory Visit (HOSPITAL_COMMUNITY)
Admission: EM | Admit: 2016-03-20 | Discharge: 2016-03-20 | Disposition: A | Discharge status: Home / Self Care | Payer: Medicaid Other | Attending: Family Medicine | Admitting: Family Medicine

## 2016-03-20 ENCOUNTER — Encounter (HOSPITAL_COMMUNITY): Payer: Self-pay | Admitting: *Deleted

## 2016-03-20 DIAGNOSIS — B372 Candidiasis of skin and nail: Secondary | ICD-10-CM

## 2016-03-20 MED ORDER — CLOTRIMAZOLE-BETAMETHASONE 1-0.05 % EX CREA: TOPICAL_CREAM | CUTANEOUS | Status: DC

## 2016-03-20 NOTE — Discharge Instructions (Signed)
Use cream as needed.

## 2016-03-20 NOTE — ED Provider Notes (Signed)
CSN: [id number]     Arrival date & time 03/20/16  1335 History   None    Chief Complaint  Patient presents with  . Skin Problem   (Consider location/radiation/quality/duration/timing/severity/associated sxs/prior Treatment) Patient is a 45 y.o. female presenting with rash. The history is provided by the patient and the spouse.  Rash Location:  Head/neck Head/neck rash location:  Scalp Quality: redness and weeping   Severity:  Mild Onset quality:  Gradual Duration:  2 days Progression:  Worsening Chronicity:  New Relieved by:  None tried Worsened by:  Nothing tried Ineffective treatments:  None tried Associated symptoms: no fever     Past Medical History  Diagnosis Date  . Asthma   . Cushing syndrome ([place]) 2013   . Depression   . Anemia   . Osteopenia 11/19/2013    Dx with osteopenia in lumbar vertebra with partial compression of L1  . Diabetes ([place]) 2013   . Diabetes mellitus with neuropathy ([place])   . HTN (hypertension) 2005   . COPD (chronic obstructive pulmonary disease) ([place]) 2013  . Compression fracture   . Bilateral hand numbness 06/25/2015  . Bone pain 02/27/2015    Diffuse bone pain in legs mostly but also in arms    . Breast pain, left 03/27/2015  . Callus of heel 12/16/2014  . Compression fracture of L1 lumbar vertebra ([facility]) 08/26/2014  . Compression fracture of T12 vertebra ([place]) 08/28/2014  . Diabetes mellitus type 2, uncontrolled ([place]) 08/26/2014    Sees Dr. Katy Fitch.  Last visit 11/27/14.  No diabetic retinopathy    . Diastolic dysfunction without heart failure 10/14/2014    Grade 2 diastolic dysfunction 2/2 pulm HTN   . Generalized anxiety disorder 03/27/2015  . Hx of cataract surgery 11/03/2014  . LUQ abdominal pain 07/17/2015  . Medication management 05/12/2015  . Morbid obesity ([place]) 09/08/2014  . 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 Non-English speaking patient 09/08/2014  . OSA (obstructive sleep apnea) 05/20/2015  . Pain due to onychomycosis of toenail 03/27/2015  .  Secondary Cushing's syndrome/ iatrogenic   . Skin rash 07/17/2015  . Tachycardia 01/13/2015  . Upper airway cough syndrome 09/05/2014    Followed in Pulmonary clinic/  [facility]/ [facility]  - Trial off advair      09/05/2014 >>>  - trial off theoph 09/17/2014 and added flutter  - rec reduce dulera to 100 2 bid 07/13/2015     . Vision changes 06/25/2015  . Vitamin D deficiency 08/28/2014   Past Surgical History  Procedure Laterality Date  . Cataract extraction Bilateral 2014  . Vaginal hysterectomy    . Hand surgery Right 12/18/2015   Family History  Problem Relation Age of Onset  . Diabetes Mother   . Stroke Father   . Asthma Father   . Hypertension Sister   . Hypertension Brother    Social History  Substance Use Topics  . Smoking status: Never Smoker   . Smokeless tobacco: Never Used  . Alcohol Use: No   OB History    No data available     Review of Systems  Constitutional: Negative.  Negative for fever.  Skin: Positive for rash. Negative for wound.  All other systems reviewed and are negative.   Allergies  Shellfish allergy; Septra; and Hydrocortisone  Home Medications   Prior to Admission medications   Medication Sig Start Date End Date Taking? Authorizing Provider  acetaminophen-codeine (TYLENOL #3) 300-30 MG tablet TAKE 1 TO 2 TABLETS BY MOUTH AT BEDTIME AS NEEDED  FOR MODERATE PAIN Patient taking differently: TAKE 1 TO 2 TABLETS BY MOUTH AT BEDTIME AS NEEDED FOR SEVERE PAIN OR COUGH 09/24/15   Josalyn Funches, MD  albuterol (PROVENTIL HFA;VENTOLIN HFA) 108 (90 Base) MCG/ACT inhaler Inhale 2 puffs into the lungs every 4 (four) hours as needed for wheezing or shortness of breath (((PLAN B))).    Historical Provider, MD  albuterol (PROVENTIL) (2.5 MG/3ML) 0.083% nebulizer solution Take 3 mLs (2.5 mg total) by nebulization every 4 (four) hours as needed for wheezing or shortness of breath (plan C). 08/21/15   Boykin Nearing, MD  aspirin 325 MG tablet Take 325 mg by  mouth every morning.    Historical Provider, MD  atorvastatin (LIPITOR) 20 MG tablet Take 20 mg by mouth daily at 6 PM.    Historical Provider, MD  Blood Glucose Monitoring Suppl (ACCU-CHEK AVIVA PLUS) W/DEVICE KIT Used as directed 11/05/14   Boykin Nearing, MD  chlorpheniramine (CHLOR-TRIMETON) 4 MG tablet Take 2 tablets by mouth at bedtime    Historical Provider, MD  cholecalciferol (VITAMIN D) 1000 UNITS tablet Take 1,000 Units by mouth every morning.    Historical Provider, MD  clonazePAM (KLONOPIN) 0.5 MG tablet Take 1 tablet (0.5 mg total) by mouth 2 (two) times daily as needed (trouble sleeping and anxiety). 01/11/16   Josalyn Funches, MD  cloNIDine (CATAPRES) 0.2 MG tablet Take 1 tablet (0.2 mg total) by mouth 3 (three) times daily. NEED OV. 01/25/16   Fay Records, MD  clotrimazole-betamethasone (LOTRISONE) cream Apply to affected area 2 times daily prn 03/20/16   Billy Fischer, MD  dextromethorphan-guaiFENesin [facility] DM) 30-600 MG 12hr tablet Take 1 tablet by mouth 2 (two) times daily as needed (with flutter valve for cough and congestion).    Historical Provider, MD  diphenhydrAMINE (BENADRYL) 25 MG tablet Take 1 tablet (25 mg total) by mouth every 6 (six) hours. Take with your dose of reglan 01/03/16   Charlesetta Shanks, MD  ferrous sulfate 325 (65 FE) MG tablet Take 1 tablet (325 mg total) by mouth daily with breakfast. 12/31/14   Josalyn Funches, MD  fluticasone (CUTIVATE) 0.05 % cream Apply topically 2 (two) times daily. 02/09/16   Josalyn Funches, MD  FOLIC ACID PO Take 1 tablet by mouth every morning.    Historical Provider, MD  furosemide (LASIX) 40 MG tablet Take 1 tablet (40 mg total) by mouth every morning. 03/27/15   Josalyn Funches, MD  gabapentin (NEURONTIN) 400 MG capsule Take 2 capsules (800 mg total) by mouth 3 (three) times daily. 01/11/16   Josalyn Funches, MD  glucose blood (RELION GLUCOSE TEST STRIPS) test strip 1 each by Other route 4 (four) times daily. ICD code E11.65 07/09/15    Boykin Nearing, MD  insulin aspart (NOVOLOG) 100 UNIT/ML injection Inject 100 Units into the skin 3 (three) times daily with meals. 03/03/16   Josalyn Funches, MD  Insulin Glargine (LANTUS SOLOSTAR) 100 UNIT/ML Solostar Pen Inject 100 Units into the skin daily. 03/03/16   Josalyn Funches, MD  Insulin Pen Needle (B-D ULTRAFINE III SHORT PEN) 31G X 8 MM MISC 1 application by Does not apply route daily. 03/27/15   Josalyn Funches, MD  Insulin Syringe-Needle U-100 (BD INSULIN SYRINGE ULTRAFINE) 31G X 15/64" 1 ML MISC 1 each by Does not apply route 3 (three) times daily. 03/27/15   Boykin Nearing, MD  Lancet Devices [facility]) lancets Use as instructed 04/03/15   Boykin Nearing, MD  lidocaine (LIDODERM) 5 % Place 1 patch onto  the skin daily. Remove & Discard patch within 12 hours or as directed by MD 01/11/16   Boykin Nearing, MD  losartan (COZAAR) 100 MG tablet Take 1 tablet (100 mg total) by mouth every morning. 08/24/15   Boykin Nearing, MD  metFORMIN (GLUCOPHAGE) 1000 MG tablet Take 1 tablet (1,000 mg total) by mouth 2 (two) times daily with a meal. 12/21/15   Josalyn Funches, MD  methocarbamol (ROBAXIN) 750 MG tablet Take 1 tablet (750 mg total) by mouth every 8 (eight) hours as needed for muscle spasms. 01/11/16   Josalyn Funches, MD  methylphenidate (RITALIN LA) 10 MG 24 hr capsule Take 10 mg by mouth daily.    Historical Provider, MD  metoCLOPramide (REGLAN) 10 MG tablet Take 1 tablet (10 mg total) by mouth every 6 (six) hours. 01/11/16   Josalyn Funches, MD  mometasone-formoterol (DULERA) 100-5 MCG/ACT AERO Take 2 puffs first thing in am and then another 2 puffs about 12 hours later. 07/13/15   Tanda Rockers, MD  montelukast (SINGULAIR) 10 MG tablet Take 1 tablet (10 mg total) by mouth at bedtime. 03/27/15   Josalyn Funches, MD  mupirocin ointment (BACTROBAN) 2 % Apply 1 application topically 3 (three) times daily. 02/26/16   Boykin Nearing, MD  omeprazole (PRILOSEC) 20 MG capsule Take 1  capsule (20 mg total) by mouth daily before breakfast. 01/11/16   Boykin Nearing, MD  [place] Calcium 500 MG TABS Take 0.4 tablets (500 mg total) by mouth 2 (two) times daily after a meal. 03/27/15   Josalyn Funches, MD  PARoxetine (PAXIL) 20 MG tablet Take 1 tablet (20 mg total) by mouth daily. Patient taking differently: Take 20 mg by mouth every morning.  09/25/15   Josalyn Funches, MD  predniSONE (DELTASONE) 1 MG tablet Take 4 mg by mouth daily with breakfast.     Historical Provider, MD  ranitidine (ZANTAC) 300 MG tablet Take 1 tablet (300 mg total) by mouth at bedtime. 01/11/16   Josalyn Funches, MD  ReliOn Ultra Thin Lancets MISC 1 each by Does not apply route 4 (four) times daily. ICD code E11.65 03/27/15   Boykin Nearing, MD  Respiratory Therapy Supplies (FLUTTER) DEVI Use as directed 09/16/14   Tanda Rockers, MD  Flossie Buffy MICRO PEN NEEDLES 32G X 4 MM MISC USE AS DIRECTED 09/08/15   Boykin Nearing, MD  verapamil (VERELAN PM) 360 MG 24 hr capsule Take 1 capsule (360 mg total) by mouth every morning. 03/27/15   Boykin Nearing, MD   Meds Ordered and Administered this Visit  Medications - No data to display  BP 130/70 mmHg  Pulse 78  Temp(Src) 98.6 F (37 C) (Oral)  Resp 18  LMP 01/25/2016 No data found.   Physical Exam  Constitutional: She appears well-developed and well-nourished.  Skin: Skin is warm and dry. Rash noted.  Weeping dermatitis in occipital skin fold of scalp, no erythema.or adenopathy.  Nursing note and vitals reviewed.   ED Course  Procedures (including critical care time)  Labs Review Labs Reviewed - No data to display  Imaging Review No results found.   Visual Acuity Review  Right Eye Distance:   Left Eye Distance:   Bilateral Distance:    Right Eye Near:   Left Eye Near:    Bilateral Near:         MDM   1. Candidal dermatitis        Billy Fischer, MD 03/20/16 2133

## 2016-03-20 NOTE — ED Notes (Signed)
Pt  Reports     She  Noticed     Open  Skin  Area    On  The  Back  Of  Her  Scalp/ neck       Dry  Crusty      Area      Present           pt  Noticed the lesion yesterday    Pt  Is  A  Diabetic

## 2016-03-21 NOTE — Telephone Encounter (Signed)
Yes this is for her yearly IV zoledronic (reclast)acid 5 mg/100 ml reclast infusion for her osteoperosis. Her last one was on 03/10/16, so she is due.   Junious Dresser and I completed the order form following the patient's last OV and [facility] faxed it.  Please call the infusion center at [facility]  [phone] to inquire about whether they received the fax and scheduling the infusion.

## 2016-03-22 NOTE — Telephone Encounter (Signed)
Reclast infusion form completed by me  Will be faxed by Junious Dresser to [phone] Junious Dresser will also call to follow up at [phone]

## 2016-03-22 NOTE — Telephone Encounter (Signed)
Pt notified appointment on Friday 14, 2017 at 8:00 am  Pt verbalized understanding  Information given in Spanish

## 2016-03-22 NOTE — Telephone Encounter (Signed)
Patient called back returning nurse's call. Please f/up

## 2016-03-22 NOTE — Telephone Encounter (Signed)
[address] [employer] Cristie Hem ID# [id number] Contacted pt to inform her of Dr. Adrian Blackwater advice pt did not answer lvm.  [place] and spoke with Raford Pitcher and stated she hasn't received a form on patient and will need to fax a new one.

## 2016-03-24 ENCOUNTER — Other Ambulatory Visit (HOSPITAL_COMMUNITY): Payer: Self-pay | Admitting: *Deleted

## 2016-03-25 ENCOUNTER — Ambulatory Visit (HOSPITAL_COMMUNITY)
Admission: RE | Admit: 2016-03-25 | Discharge: 2016-03-25 | Disposition: A | Discharge status: Home / Self Care | Payer: Medicaid Other | Source: Ambulatory Visit | Attending: Family Medicine | Admitting: Family Medicine

## 2016-03-25 DIAGNOSIS — K219 Gastro-esophageal reflux disease without esophagitis: Secondary | ICD-10-CM | POA: Diagnosis not present

## 2016-03-25 DIAGNOSIS — T380X5A Adverse effect of glucocorticoids and synthetic analogues, initial encounter: Secondary | ICD-10-CM | POA: Diagnosis not present

## 2016-03-25 DIAGNOSIS — M818 Other osteoporosis without current pathological fracture: Secondary | ICD-10-CM | POA: Diagnosis present

## 2016-03-25 MED ORDER — ZOLEDRONIC ACID 5 MG/100ML IV SOLN
5.0000 mg | Freq: Once | INTRAVENOUS | Status: AC
  Administered 2016-03-25: 5 mg via INTRAVENOUS

## 2016-03-25 MED ORDER — ZOLEDRONIC ACID 5 MG/100ML IV SOLN
INTRAVENOUS | Status: AC
  Administered 2016-03-25: 5 mg via INTRAVENOUS
  Filled 2016-03-25: qty 100

## 2016-03-31 ENCOUNTER — Other Ambulatory Visit: Payer: Self-pay | Admitting: Family Medicine

## 2016-03-31 MED FILL — ?LOSARTAN POTASSIUM 100 MG: 100 | 30 days supply | Qty: 30 | Fill #1

## 2016-04-01 ENCOUNTER — Other Ambulatory Visit (INDEPENDENT_AMBULATORY_CARE_PROVIDER_SITE_OTHER): Payer: Medicaid Other

## 2016-04-01 ENCOUNTER — Ambulatory Visit (INDEPENDENT_AMBULATORY_CARE_PROVIDER_SITE_OTHER): Payer: Medicaid Other | Admitting: Pulmonary Disease

## 2016-04-01 ENCOUNTER — Encounter: Payer: Self-pay | Admitting: Pulmonary Disease

## 2016-04-01 ENCOUNTER — Telehealth: Payer: Self-pay | Admitting: Family Medicine

## 2016-04-01 VITALS — BP 136/82 | HR 98 | Ht <= 58 in | Wt 186.0 lb

## 2016-04-01 DIAGNOSIS — M818 Other osteoporosis without current pathological fracture: Secondary | ICD-10-CM | POA: Diagnosis not present

## 2016-04-01 DIAGNOSIS — J45998 Other asthma: Secondary | ICD-10-CM

## 2016-04-01 DIAGNOSIS — T380X5A Adverse effect of glucocorticoids and synthetic analogues, initial encounter: Secondary | ICD-10-CM

## 2016-04-01 LAB — CBC WITH DIFFERENTIAL/PLATELET
Basophils Absolute: 0 10*3/uL (ref 0.0–0.1)
Basophils Relative: 0.2 % (ref 0.0–3.0)
Eosinophils Absolute: 0.3 10*3/uL (ref 0.0–0.7)
Eosinophils Relative: 2.1 % (ref 0.0–5.0)
HCT: 35.7 % — ABNORMAL LOW (ref 36.0–46.0)
Hemoglobin: 11.8 g/dL — ABNORMAL LOW (ref 12.0–15.0)
Lymphocytes Relative: 24.7 % (ref 12.0–46.0)
Lymphs Abs: 3 10*3/uL (ref 0.7–4.0)
MCHC: 32.9 g/dL (ref 30.0–36.0)
MCV: 80.2 fl (ref 78.0–100.0)
Monocytes Absolute: 1 10*3/uL (ref 0.1–1.0)
Monocytes Relative: 7.8 % (ref 3.0–12.0)
Neutro Abs: 8 10*3/uL — ABNORMAL HIGH (ref 1.4–7.7)
Neutrophils Relative %: 65.2 % (ref 43.0–77.0)
Platelets: 480 10*3/uL — ABNORMAL HIGH (ref 150.0–400.0)
RBC: 4.45 Mil/uL (ref 3.87–5.11)
RDW: 15.6 % — ABNORMAL HIGH (ref 11.5–15.5)
WBC: 12.2 10*3/uL — ABNORMAL HIGH (ref 4.0–10.5)

## 2016-04-01 MED ORDER — MONTELUKAST SODIUM 10 MG PO TABS: 10.0000 mg | ORAL_TABLET | Freq: Every day | ORAL | Status: DC

## 2016-04-01 NOTE — Telephone Encounter (Signed)
Refill Request.  

## 2016-04-01 NOTE — Addendum Note (Signed)
Addended by: Beckie Busing on: 04/01/2016 02:48 PM   Modules accepted: Orders

## 2016-04-01 NOTE — Assessment & Plan Note (Signed)
I think it's possible that she has asthma based on her lengthy history, however was definite is a strong component of vocal cord related wheezing. Today on exam she only wheeze when I examined her and she did not have wheezing in her lung fields.  There is a significant portion of patients (up to 15% some studies) who have asthma and vocal cord dysfunction.  Plan: Check exhaled nitric oxide Check CBC with differential Check serum IgE Continue Dulera and cellular Sent to Dr. Wanita Chamberlain at the [facility] for evaluation and management of vocal cord dysfunction F/u 2 months

## 2016-04-01 NOTE — Telephone Encounter (Signed)
Singulair refilled.

## 2016-04-01 NOTE — Assessment & Plan Note (Signed)
I strongly suspect that the prednisone is providing more of a psychologic benefit that it is physiologic benefit based on her physical exam findings today. Hopefully with effective treatment of her vocal cord dysfunction we can help her wean off of her prednisone.

## 2016-04-01 NOTE — Patient Instructions (Signed)
Keep taking the [facility] as your doing We will call you with the results of the bloodwork We are going to refer you to Dr. Wanita Chamberlain at the [place] Follow-up in 3 months or sooner if needed

## 2016-04-01 NOTE — Progress Notes (Signed)
Subjective:    Patient ID: Raven Turner, female    DOB: 1971-03-30, 45 y.o.   MRN: [id number]  Synopsis: Former patient of Dr. Melvyn Novas with recurrent wheeze, shortness of breath and cough felt to be due to either vocal cord dysfunction or asthma.  Dr. Melvyn Novas summarized the workup for her symptoms as follows: 09/05/2014  try dulera 100 2bid and off advair   - 09/16/2014 off theoph and brethine    - 11/03/14  PFTs p am dulera 100 on 30 mg prednisone  FEV1 1.56(73%) ratio 85 =  No obst, including fef 25-75%, only abn is reduction in ERV -med calendar 11/17/2014 , 02/17/2015  - 12/2014 changed to physiologic steroids  - 01/12/2015 p extensive coaching HFA effectiveness = 75%  - spirometry 03/31/15 1.48 (62%) ratio 82  p am dulera and while symptomatic requesting saba  05/12/2015 med calendar , redone 10/13/2015> did not bring 12/11/2015  - 12/11/2015  extensive coaching HFA effectiveness =     75% from as baseline of < 50 (ti   too short)    HPI Chief Complaint  Patient presents with  . Follow-up    Former MW pt here for a 4 month follow up. PT c/o SOB/wheezing with activity. Denies any sinus pressure/drainage, chest congestion/tightness, cough, fever, nausea or vomiting.   She says that she has been living her for several years with symptoms of asthma.  She moved here from [place] a few years ago.  She says that when she living in [place] she had many hospitalizations for her asthma.  She said that from 2012 through 2015 she was in the hospital nearly every month from her asthma symptoms. She says that her pulmonologist in [place] told her that she had COPD and they performed a bronchoscopy for some reason.    She has wheezing "always" and she says that she has to use her nebulizer frequently, especially before going to bed in the evnings.  She said that her family has had problems with pneumonia and asthma. Many family members have asthma.  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  Here in the [place] her asthma symptoms have improved.    She has phlegm in her throat every day, sometime in the chest but mostly in the throat.    She has a difficult time walking because of multiple fractures in her spines.  She has been really limited by this.    She says that she has been unable to decrease her prednisone any lower than 52m daily. When she has dropped lower than that she needs to go to the hosptial and she feels like she is withdrawing from the prednsione.    Past Medical History  Diagnosis Date  . Asthma   . [place]ushing syndrome [place]   . Depression   . Anemia   . Osteopenia 11/19/2013    Dx with osteopenia in lumbar vertebra with partial compression of L1  . Diabetes ([place] 2013   . Diabetes mellitus with neuropathy ([place]   . HTN (hypertension) 2005   . COPD (chronic obstructive pulmonary disease) [place]  . Compression fracture   . Bilateral hand numbness 06/25/2015  . Bone pain 02/27/2015    Diffuse bone pain in legs mostly but also in arms    . Breast pain, left 03/27/2015  . Callus of heel 12/16/2014  . Compression fracture of L1  lumbar vertebra ([place]) 08/26/2014  . Compression fracture of T12 vertebra ([place]) 08/28/2014  . Diabetes mellitus type 2, uncontrolled ([place]) 08/26/2014    Sees Dr. Katy Fitch.  Last visit 11/27/14.  No diabetic retinopathy    . Diastolic dysfunction without heart failure 10/14/2014    Grade 2 diastolic dysfunction 2/2 pulm HTN   . Generalized anxiety disorder 03/27/2015  . Hx of cataract surgery 11/03/2014  . LUQ abdominal pain 07/17/2015  . Medication management 05/12/2015  . Morbid obesity ([place]) 09/08/2014  . 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 Non-English speaking patient 09/08/2014  . OSA (obstructive sleep apnea) 05/20/2015  . Pain due to onychomycosis of toenail 03/27/2015  . Secondary Cushing's syndrome/ iatrogenic   . Skin rash 07/17/2015  . Tachycardia 01/13/2015  . Upper airway cough  syndrome 09/05/2014    Followed in Pulmonary clinic/ [facility]/ [place]  - Trial off advair      09/05/2014 >>>  - trial off theoph 09/17/2014 and added flutter  - rec reduce dulera to 100 2 bid 07/13/2015     . Vision changes 06/25/2015  . Vitamin D deficiency 08/28/2014     Family History  Problem Relation Age of Onset  . Diabetes Mother   . Stroke Father   . Asthma Father   . Hypertension Sister   . Hypertension Brother      Social History   Social History  . Marital Status: Married    Spouse Name: N/A  . Number of Children: N/A  . Years of Education: N/A   Occupational History  . Not on file.   Social History Main Topics  . Smoking status: Never Smoker   . Smokeless tobacco: Never Used  . Alcohol Use: No  . Drug Use: No  . Sexual Activity: Yes    Birth Control/ Protection: Surgical   Other Topics Concern  . Not on file   Social History Narrative   Lives at home with husband.  No children   She does not work   Highest level of education:  12th grade     Allergies  Allergen Reactions  . Shellfish Allergy Anaphylaxis  . Septra [Sulfamethoxazole-Trimethoprim] Rash and Other (See Comments)    Facial redness with pruritus   . Hydrocortisone Rash     Outpatient Prescriptions Prior to Visit  Medication Sig Dispense Refill  . albuterol (PROVENTIL HFA;VENTOLIN HFA) 108 (90 Base) MCG/ACT inhaler Inhale 2 puffs into the lungs every 4 (four) hours as needed for wheezing or shortness of breath (((PLAN B))).    Marland Kitchen albuterol (PROVENTIL) (2.5 MG/3ML) 0.083% nebulizer solution Take 3 mLs (2.5 mg total) by nebulization every 4 (four) hours as needed for wheezing or shortness of breath (plan C). 75 mL 3  . aspirin 325 MG tablet Take 325 mg by mouth every morning.    Marland Kitchen atorvastatin (LIPITOR) 20 MG tablet Take 20 mg by mouth daily at 6 PM.    . Blood Glucose Monitoring Suppl (ACCU-CHEK AVIVA PLUS) W/DEVICE KIT Used as directed 1 kit 0  . chlorpheniramine (CHLOR-TRIMETON) 4  MG tablet Take 2 tablets by mouth at bedtime    . cholecalciferol (VITAMIN D) 1000 UNITS tablet Take 1,000 Units by mouth every morning.    . clonazePAM (KLONOPIN) 0.5 MG tablet Take 1 tablet (0.5 mg total) by mouth 2 (two) times daily as needed (trouble sleeping and anxiety). 60 tablet 2  . cloNIDine (CATAPRES) 0.2 MG tablet Take 1 tablet (0.2 mg total) by mouth 3 (three) times daily. NEED  OV. 90 tablet 0  . clotrimazole-betamethasone (LOTRISONE) cream Apply to affected area 2 times daily prn 45 g 0  . ferrous sulfate 325 (65 FE) MG tablet Take 1 tablet (325 mg total) by mouth daily with breakfast. 30 tablet 3  . fluticasone (CUTIVATE) 0.05 % cream Apply topically 2 (two) times daily. 30 g 0  . FOLIC ACID PO Take 1 tablet by mouth every morning.    . furosemide (LASIX) 40 MG tablet Take 1 tablet (40 mg total) by mouth every morning. 30 tablet 2  . gabapentin (NEURONTIN) 400 MG capsule Take 2 capsules (800 mg total) by mouth 3 (three) times daily. 180 capsule 5  . glucose blood (RELION GLUCOSE TEST STRIPS) test strip 1 each by Other route 4 (four) times daily. ICD code E11.65 400 each 3  . insulin aspart (NOVOLOG) 100 UNIT/ML injection Inject 100 Units into the skin 3 (three) times daily with meals. 9 vial 3  . Insulin Glargine (LANTUS SOLOSTAR) 100 UNIT/ML Solostar Pen Inject 100 Units into the skin daily. 10 pen 11  . Insulin Pen Needle (B-D ULTRAFINE III SHORT PEN) 31G X 8 MM MISC 1 application by Does not apply route daily. 100 each 3  . Insulin Syringe-Needle U-100 (BD INSULIN SYRINGE ULTRAFINE) 31G X 15/64" 1 ML MISC 1 each by Does not apply route 3 (three) times daily. 300 each 3  . Lancet Devices (ACCU-CHEK SOFTCLIX) lancets Use as instructed 1 each 0  . lidocaine (LIDODERM) 5 % Place 1 patch onto the skin daily. Remove & Discard patch within 12 hours or as directed by MD 30 patch 0  . losartan (COZAAR) 100 MG tablet Take 1 tablet (100 mg total) by mouth every morning. 30 tablet 3  .  metFORMIN (GLUCOPHAGE) 1000 MG tablet Take 1 tablet (1,000 mg total) by mouth 2 (two) times daily with a meal. 60 tablet 11  . methocarbamol (ROBAXIN) 750 MG tablet Take 1 tablet (750 mg total) by mouth every 8 (eight) hours as needed for muscle spasms. 90 tablet 11  . methylphenidate (RITALIN LA) 10 MG 24 hr capsule Take 10 mg by mouth daily.    . metoCLOPramide (REGLAN) 10 MG tablet Take 1 tablet (10 mg total) by mouth every 6 (six) hours. 30 tablet 3  . mometasone-formoterol (DULERA) 100-5 MCG/ACT AERO Take 2 puffs first thing in am and then another 2 puffs about 12 hours later. 1 Inhaler 11  . montelukast (SINGULAIR) 10 MG tablet Take 1 tablet (10 mg total) by mouth at bedtime. 30 tablet 11  . mupirocin ointment (BACTROBAN) 2 % Apply 1 application topically 3 (three) times daily. 22 g 0  . omeprazole (PRILOSEC) 20 MG capsule Take 1 capsule (20 mg total) by mouth daily before breakfast. 30 capsule 11  . Oyster Shell Calcium 500 MG TABS Take 0.4 tablets (500 mg total) by mouth 2 (two) times daily after a meal. 60 tablet 11  . PARoxetine (PAXIL) 20 MG tablet Take 1 tablet (20 mg total) by mouth daily. (Patient taking differently: Take 20 mg by mouth every morning. ) 30 tablet 5  . predniSONE (DELTASONE) 1 MG tablet Take 4 mg by mouth daily with breakfast.     . ranitidine (ZANTAC) 300 MG tablet Take 1 tablet (300 mg total) by mouth at bedtime. 30 tablet 5  . ReliOn Ultra Thin Lancets MISC 1 each by Does not apply route 4 (four) times daily. ICD code E11.65 400 each 3  . ULTICARE MICRO PEN  NEEDLES 32G X 4 MM MISC USE AS DIRECTED 100 each 2  . verapamil (VERELAN PM) 360 MG 24 hr capsule Take 1 capsule (360 mg total) by mouth every morning. 30 capsule 11  . acetaminophen-codeine (TYLENOL #3) 300-30 MG tablet TAKE 1 TO 2 TABLETS BY MOUTH AT BEDTIME AS NEEDED FOR MODERATE PAIN (Patient not taking: Reported on 04/01/2016) 60 tablet 2  . dextromethorphan-guaiFENesin (MUCINEX DM) 30-600 MG 12hr tablet Take 1  tablet by mouth 2 (two) times daily as needed (with flutter valve for cough and congestion). Reported on 04/01/2016    . diphenhydrAMINE (BENADRYL) 25 MG tablet Take 1 tablet (25 mg total) by mouth every 6 (six) hours. Take with your dose of reglan (Patient not taking: Reported on 04/01/2016) 30 tablet 0  . Respiratory Therapy Supplies (FLUTTER) DEVI Use as directed 1 each 0   Facility-Administered Medications Prior to Visit  Medication Dose Route Frequency Provider Last Rate Last Dose  . DOBUTamine (DOBUTREX) 1,000 mcg/mL in dextrose 5% 250 mL infusion  30 mcg/kg/min Intravenous Continuous Mihai Croitoru, MD 145.3 mL/hr at 04/22/15 1300 30 mcg/kg/min at 04/22/15 1300       Review of Systems  Constitutional: Negative for fever, chills and fatigue.  HENT: Negative for postnasal drip, rhinorrhea and sinus pressure.   Respiratory: Positive for cough, shortness of breath and wheezing.   Cardiovascular: Negative for chest pain, palpitations and leg swelling.       Objective:   Physical Exam Filed Vitals:   04/01/16 1401  BP: 136/82  Pulse: 98  Height: _0  (1.473 m)  Weight: 186 lb (84.369 kg)  SpO2: 98%  RA  Gen: obese, chronically ill appearing, no acute distress HENT: NCAT, OP clear, neck supple without masses Eyes: PERRL, EOMi Lymph: no cervical lymphadenopathy PULM: Upper airway wheeze that only occurs with pulmonary exam, resolves on command CV: RRR, no mgr, no JVD GI: BS+, soft, nontender, no hsm Derm: no rash or skin breakdown MSK: normal bulk and tone Neuro: A&Ox4, CN II-XII intact, strength 5/5 in all 4 extremities Psyche: normal mood and affect    Records from Dr. Melvyn Novas were reviewed were she was treated for asthma January 2017 chest x-ray images personally showing normal pulmonary parenchyma     Assessment & Plan:  Asthma ? severity vs VCD/ pseudo asthma I think it's possible that she has asthma based on her lengthy history, however was definite is a strong  component of vocal cord related wheezing. Today on exam she only wheeze when I examined her and she did not have wheezing in her lung fields.  There is a significant portion of patients (up to 15% some studies) who have asthma and vocal cord dysfunction.  Plan: Check exhaled nitric oxide Check CBC with differential Check serum IgE Continue Dulera and cellular Sent to Dr. Wanita Chamberlain at the [facility] for evaluation and management of vocal cord dysfunction F/u 2 months  Steroid-induced osteoporosis I strongly suspect that the prednisone is providing more of a psychologic benefit that it is physiologic benefit based on her physical exam findings today. Hopefully with effective treatment of her vocal cord dysfunction we can help her wean off of her prednisone.     Current outpatient prescriptions:  .  albuterol (PROVENTIL HFA;VENTOLIN HFA) 108 (90 Base) MCG/ACT inhaler, Inhale 2 puffs into the lungs every 4 (four) hours as needed for wheezing or shortness of breath (((PLAN B)))., Disp: , Rfl:  .  albuterol (PROVENTIL) (2.5 MG/3ML)  0.083% nebulizer solution, Take 3 mLs (2.5 mg total) by nebulization every 4 (four) hours as needed for wheezing or shortness of breath (plan C)., Disp: 75 mL, Rfl: 3 .  aspirin 325 MG tablet, Take 325 mg by mouth every morning., Disp: , Rfl:  .  atorvastatin (LIPITOR) 20 MG tablet, Take 20 mg by mouth daily at 6 PM., Disp: , Rfl:  .  Blood Glucose Monitoring Suppl (ACCU-CHEK AVIVA PLUS) W/DEVICE KIT, Used as directed, Disp: 1 kit, Rfl: 0 .  chlorpheniramine (CHLOR-TRIMETON) 4 MG tablet, Take 2 tablets by mouth at bedtime, Disp: , Rfl:  .  cholecalciferol (VITAMIN D) 1000 UNITS tablet, Take 1,000 Units by mouth every morning., Disp: , Rfl:  .  clonazePAM (KLONOPIN) 0.5 MG tablet, Take 1 tablet (0.5 mg total) by mouth 2 (two) times daily as needed (trouble sleeping and anxiety)., Disp: 60 tablet, Rfl: 2 .  cloNIDine (CATAPRES) 0.2 MG  tablet, Take 1 tablet (0.2 mg total) by mouth 3 (three) times daily. NEED OV., Disp: 90 tablet, Rfl: 0 .  clotrimazole-betamethasone (LOTRISONE) cream, Apply to affected area 2 times daily prn, Disp: 45 g, Rfl: 0 .  ferrous sulfate 325 (65 FE) MG tablet, Take 1 tablet (325 mg total) by mouth daily with breakfast., Disp: 30 tablet, Rfl: 3 .  fluticasone (CUTIVATE) 0.05 % cream, Apply topically 2 (two) times daily., Disp: 30 g, Rfl: 0 .  FOLIC ACID PO, Take 1 tablet by mouth every morning., Disp: , Rfl:  .  furosemide (LASIX) 40 MG tablet, Take 1 tablet (40 mg total) by mouth every morning., Disp: 30 tablet, Rfl: 2 .  gabapentin (NEURONTIN) 400 MG capsule, Take 2 capsules (800 mg total) by mouth 3 (three) times daily., Disp: 180 capsule, Rfl: 5 .  glucose blood (RELION GLUCOSE TEST STRIPS) test strip, 1 each by Other route 4 (four) times daily. ICD code E11.65, Disp: 400 each, Rfl: 3 .  insulin aspart (NOVOLOG) 100 UNIT/ML injection, Inject 100 Units into the skin 3 (three) times daily with meals., Disp: 9 vial, Rfl: 3 .  Insulin Glargine (LANTUS SOLOSTAR) 100 UNIT/ML Solostar Pen, Inject 100 Units into the skin daily., Disp: 10 pen, Rfl: 11 .  Insulin Pen Needle (B-D ULTRAFINE III SHORT PEN) 31G X 8 MM MISC, 1 application by Does not apply route daily., Disp: 100 each, Rfl: 3 .  Insulin Syringe-Needle U-100 (BD INSULIN SYRINGE ULTRAFINE) 31G X 15/64" 1 ML MISC, 1 each by Does not apply route 3 (three) times daily., Disp: 300 each, Rfl: 3 .  Lancet Devices (ACCU-CHEK SOFTCLIX) lancets, Use as instructed, Disp: 1 each, Rfl: 0 .  lidocaine (LIDODERM) 5 %, Place 1 patch onto the skin daily. Remove & Discard patch within 12 hours or as directed by MD, Disp: 30 patch, Rfl: 0 .  losartan (COZAAR) 100 MG tablet, Take 1 tablet (100 mg total) by mouth every morning., Disp: 30 tablet, Rfl: 3 .  metFORMIN (GLUCOPHAGE) 1000 MG tablet, Take 1 tablet (1,000 mg total) by mouth 2 (two) times daily with a meal., Disp:  60 tablet, Rfl: 11 .  methocarbamol (ROBAXIN) 750 MG tablet, Take 1 tablet (750 mg total) by mouth every 8 (eight) hours as needed for muscle spasms., Disp: 90 tablet, Rfl: 11 .  methylphenidate (RITALIN LA) 10 MG 24 hr capsule, Take 10 mg by mouth daily., Disp: , Rfl:  .  metoCLOPramide (REGLAN) 10 MG tablet, Take 1 tablet (10 mg total) by mouth every 6 (six) hours., Disp:  30 tablet, Rfl: 3 .  mometasone-formoterol (DULERA) 100-5 MCG/ACT AERO, Take 2 puffs first thing in am and then another 2 puffs about 12 hours later., Disp: 1 Inhaler, Rfl: 11 .  montelukast (SINGULAIR) 10 MG tablet, Take 1 tablet (10 mg total) by mouth at bedtime., Disp: 30 tablet, Rfl: 11 .  mupirocin ointment (BACTROBAN) 2 %, Apply 1 application topically 3 (three) times daily., Disp: 22 g, Rfl: 0 .  omeprazole (PRILOSEC) 20 MG capsule, Take 1 capsule (20 mg total) by mouth daily before breakfast., Disp: 30 capsule, Rfl: 11 .  Oyster Shell Calcium 500 MG TABS, Take 0.4 tablets (500 mg total) by mouth 2 (two) times daily after a meal., Disp: 60 tablet, Rfl: 11 .  PARoxetine (PAXIL) 20 MG tablet, Take 1 tablet (20 mg total) by mouth daily. (Patient taking differently: Take 20 mg by mouth every morning. ), Disp: 30 tablet, Rfl: 5 .  predniSONE (DELTASONE) 1 MG tablet, Take 4 mg by mouth daily with breakfast. , Disp: , Rfl:  .  ranitidine (ZANTAC) 300 MG tablet, Take 1 tablet (300 mg total) by mouth at bedtime., Disp: 30 tablet, Rfl: 5 .  ReliOn Ultra Thin Lancets MISC, 1 each by Does not apply route 4 (four) times daily. ICD code E11.65, Disp: 400 each, Rfl: 3 .  ULTICARE MICRO PEN NEEDLES 32G X 4 MM MISC, USE AS DIRECTED, Disp: 100 each, Rfl: 2 .  verapamil (VERELAN PM) 360 MG 24 hr capsule, Take 1 capsule (360 mg total) by mouth every morning., Disp: 30 capsule, Rfl: 11 .  acetaminophen-codeine (TYLENOL #3) 300-30 MG tablet, TAKE 1 TO 2 TABLETS BY MOUTH AT BEDTIME AS NEEDED FOR MODERATE PAIN (Patient not taking: Reported on  04/01/2016), Disp: 60 tablet, Rfl: 2 .  dextromethorphan-guaiFENesin (MUCINEX DM) 30-600 MG 12hr tablet, Take 1 tablet by mouth 2 (two) times daily as needed (with flutter valve for cough and congestion). Reported on 04/01/2016, Disp: , Rfl:  .  diphenhydrAMINE (BENADRYL) 25 MG tablet, Take 1 tablet (25 mg total) by mouth every 6 (six) hours. Take with your dose of reglan (Patient not taking: Reported on 04/01/2016), Disp: 30 tablet, Rfl: 0 .  Respiratory Therapy Supplies (FLUTTER) DEVI, Use as directed, Disp: 1 each, Rfl: 0 No current facility-administered medications for this visit.  Facility-Administered Medications Ordered in Other Visits:  .  DOBUTamine (DOBUTREX) 1,000 mcg/mL in dextrose 5% 250 mL infusion, 30 mcg/kg/min, Intravenous, Continuous, Mihai Croitoru, MD, Last Rate: 145.3 mL/hr at 04/22/15 1300, 30 mcg/kg/min at 04/22/15 1300

## 2016-04-01 NOTE — Telephone Encounter (Signed)
Patient is needing a refill on singulair

## 2016-04-01 NOTE — Addendum Note (Signed)
Addended by: Beckie Busing on: 04/01/2016 02:42 PM   Modules accepted: Orders

## 2016-04-04 ENCOUNTER — Other Ambulatory Visit: Payer: Self-pay | Admitting: Pharmacist

## 2016-04-04 LAB — IGE: IgE (Immunoglobulin E), Serum: 55 kU/L (ref <115)

## 2016-04-04 MED ORDER — LOSARTAN POTASSIUM 100 MG PO TABS: 100.0000 mg | ORAL_TABLET | ORAL | 3 refills | Status: DC

## 2016-04-04 NOTE — Telephone Encounter (Signed)
What refill is being requested?

## 2016-04-07 ENCOUNTER — Other Ambulatory Visit: Payer: Self-pay | Admitting: Family Medicine

## 2016-04-07 NOTE — Telephone Encounter (Signed)
Rx Requests 

## 2016-04-10 ENCOUNTER — Other Ambulatory Visit: Payer: Self-pay | Admitting: Family Medicine

## 2016-04-10 DIAGNOSIS — I10 Essential (primary) hypertension: Secondary | ICD-10-CM

## 2016-04-12 ENCOUNTER — Ambulatory Visit (HOSPITAL_COMMUNITY)
Admission: EM | Admit: 2016-04-12 | Discharge: 2016-04-12 | Disposition: A | Discharge status: Home / Self Care | Payer: Medicaid Other | Attending: Family Medicine | Admitting: Family Medicine

## 2016-04-12 ENCOUNTER — Encounter (HOSPITAL_COMMUNITY): Payer: Self-pay | Admitting: Emergency Medicine

## 2016-04-12 DIAGNOSIS — L0291 Cutaneous abscess, unspecified: Secondary | ICD-10-CM | POA: Diagnosis not present

## 2016-04-12 DIAGNOSIS — Z79899 Other long term (current) drug therapy: Secondary | ICD-10-CM | POA: Insufficient documentation

## 2016-04-12 DIAGNOSIS — Z794 Long term (current) use of insulin: Secondary | ICD-10-CM | POA: Diagnosis not present

## 2016-04-12 DIAGNOSIS — Z7982 Long term (current) use of aspirin: Secondary | ICD-10-CM | POA: Insufficient documentation

## 2016-04-12 DIAGNOSIS — E1140 Type 2 diabetes mellitus with diabetic neuropathy, unspecified: Secondary | ICD-10-CM | POA: Diagnosis not present

## 2016-04-12 DIAGNOSIS — L02413 Cutaneous abscess of right upper limb: Secondary | ICD-10-CM | POA: Insufficient documentation

## 2016-04-12 DIAGNOSIS — L03113 Cellulitis of right upper limb: Secondary | ICD-10-CM | POA: Diagnosis not present

## 2016-04-12 DIAGNOSIS — L0390 Cellulitis, unspecified: Secondary | ICD-10-CM | POA: Diagnosis not present

## 2016-04-12 MED ORDER — DOXYCYCLINE HYCLATE 100 MG PO CAPS: 100.0000 mg | ORAL_CAPSULE | Freq: Two times a day (BID) | ORAL | 0 refills | Status: DC

## 2016-04-12 NOTE — ED Triage Notes (Signed)
Pt has an abscess on her right forearm.  It is red and swollen and a part of it has come to a head.  Pt is diabetic so did not want it to drain on its own, without medical treatment.

## 2016-04-12 NOTE — ED Provider Notes (Signed)
CSN: [id number]     Arrival date & time 04/12/16  1428 History   First MD Initiated Contact with Patient 04/12/16 1552     Chief Complaint  Patient presents with  . Abscess    right forearm   (Consider location/radiation/quality/duration/timing/severity/associated sxs/prior Treatment) Patient has developed some furunculosis/cellulitis on right forearm.   The history is provided by the patient. The history is limited by a language barrier.  Abscess  Location:  Shoulder/arm Shoulder/arm abscess location:  R arm Size:  3cm diameter Abscess quality: induration and redness   Red streaking: no   Duration:  2 days Progression:  Worsening Chronicity:  New Context: diabetes   Relieved by:  Nothing Worsened by:  Nothing Ineffective treatments:  None tried   Past Medical History:  Diagnosis Date  . Anemia   . Asthma   . Bilateral hand numbness 06/25/2015  . Bone pain 02/27/2015   Diffuse bone pain in legs mostly but also in arms    . Breast pain, left 03/27/2015  . Callus of heel 12/16/2014  . Compression fracture   . Compression fracture of L1 lumbar vertebra (HCC) 08/26/2014  . Compression fracture of T12 vertebra ([place]) 08/28/2014  . COPD (chronic obstructive pulmonary disease) ([place]) 2013  . Cushing syndrome ([place]) 2013   . Depression   . Diabetes ([place]) 2013   . Diabetes mellitus type 2, uncontrolled ([place]) 08/26/2014   Sees Dr. Katy Fitch.  Last visit 11/27/14.  No diabetic retinopathy    . Diabetes mellitus with neuropathy ([place])   . Diastolic dysfunction without heart failure 10/14/2014   Grade 2 diastolic dysfunction 2/2 pulm HTN   . Generalized anxiety disorder 03/27/2015  . HTN (hypertension) 2005   . Hx of cataract surgery 11/03/2014  . LUQ abdominal pain 07/17/2015  . Medication management 05/12/2015  . Morbid obesity ([place]) 09/08/2014  . 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 Non-English speaking patient 09/08/2014  . OSA (obstructive sleep apnea) 05/20/2015  . Osteopenia 11/19/2013   Dx  with osteopenia in lumbar vertebra with partial compression of L1  . Pain due to onychomycosis of toenail 03/27/2015  . Secondary Cushing's syndrome/ iatrogenic   . Skin rash 07/17/2015  . Tachycardia 01/13/2015  . Upper airway cough syndrome 09/05/2014   Followed in Pulmonary clinic/ [facility]/ [place]  - Trial off advair      09/05/2014 >>>  - trial off theoph 09/17/2014 and added flutter  - rec reduce dulera to 100 2 bid 07/13/2015     . Vision changes 06/25/2015  . Vitamin D deficiency 08/28/2014   Past Surgical History:  Procedure Laterality Date  . CATARACT EXTRACTION Bilateral 2014  . HAND SURGERY Right 12/18/2015  . VAGINAL HYSTERECTOMY     Family History  Problem Relation Age of Onset  . Diabetes Mother   . Stroke Father   . Asthma Father   . Hypertension Sister   . Hypertension Brother    Social History  Substance Use Topics  . Smoking status: Never Smoker  . Smokeless tobacco: Never Used  . Alcohol use No   OB History    No data available     Review of Systems  Constitutional: Negative.   HENT: Negative.   Eyes: Negative.   Respiratory: Negative.   Cardiovascular: Negative.   Gastrointestinal: Negative.   Endocrine: Negative.   Genitourinary: Negative.   Musculoskeletal: Negative.   Skin: Positive for wound.  Allergic/Immunologic: Negative.   Neurological: Negative.   Hematological: Negative.   Psychiatric/Behavioral: Negative.  Allergies  Shellfish allergy; Septra [sulfamethoxazole-trimethoprim]; and Hydrocortisone  Home Medications   Prior to Admission medications   Medication Sig Start Date End Date Taking? Authorizing Provider  acetaminophen-codeine (TYLENOL #3) 300-30 MG tablet TAKE 1 TO 2 TABLETS BY MOUTH AT BEDTIME AS NEEDED FOR MODERATE PAIN 09/24/15  Yes Boykin Nearing, MD  aspirin 325 MG tablet Take 325 mg by mouth every morning.   Yes Historical Provider, MD  Blood Glucose Monitoring Suppl (ACCU-CHEK AVIVA PLUS) W/DEVICE KIT Used as  directed 11/05/14  Yes Josalyn Funches, MD  cholecalciferol (VITAMIN D) 1000 UNITS tablet Take 1,000 Units by mouth every morning.   Yes Historical Provider, MD  clonazePAM (KLONOPIN) 0.5 MG tablet Take 1 tablet (0.5 mg total) by mouth 2 (two) times daily as needed (trouble sleeping and anxiety). 01/11/16  Yes Josalyn Funches, MD  cloNIDine (CATAPRES) 0.2 MG tablet Take 1 tablet (0.2 mg total) by mouth 3 (three) times daily. NEED OV. 01/25/16  Yes Fay Records, MD  ferrous sulfate 325 (65 FE) MG tablet Take 1 tablet (325 mg total) by mouth daily with breakfast. 12/31/14  Yes Josalyn Funches, MD  FOLIC ACID PO Take 1 tablet by mouth every morning.   Yes Historical Provider, MD  furosemide (LASIX) 40 MG tablet Take 1 tablet (40 mg total) by mouth every morning. 03/27/15  Yes Josalyn Funches, MD  gabapentin (NEURONTIN) 400 MG capsule Take 2 capsules (800 mg total) by mouth 3 (three) times daily. 01/11/16  Yes Josalyn Funches, MD  glucose blood (RELION GLUCOSE TEST STRIPS) test strip 1 each by Other route 4 (four) times daily. ICD code E11.65 07/09/15  Yes Josalyn Funches, MD  insulin aspart (NOVOLOG) 100 UNIT/ML injection Inject 100 Units into the skin 3 (three) times daily with meals. 03/03/16  Yes Josalyn Funches, MD  Insulin Glargine (LANTUS SOLOSTAR) 100 UNIT/ML Solostar Pen Inject 100 Units into the skin daily. 03/03/16  Yes Josalyn Funches, MD  Insulin Pen Needle (B-D ULTRAFINE III SHORT PEN) 31G X 8 MM MISC 1 application by Does not apply route daily. 03/27/15  Yes Josalyn Funches, MD  Insulin Syringe-Needle U-100 (BD INSULIN SYRINGE ULTRAFINE) 31G X 15/64" 1 ML MISC 1 each by Does not apply route 3 (three) times daily. 03/27/15  Yes Boykin Nearing, MD  Lancet Devices [facility]) lancets Use as instructed 04/03/15  Yes Boykin Nearing, MD  losartan (COZAAR) 100 MG tablet Take 1 tablet (100 mg total) by mouth every morning. 04/04/16  Yes Josalyn Funches, MD  metFORMIN (GLUCOPHAGE) 1000 MG tablet Take 1  tablet (1,000 mg total) by mouth 2 (two) times daily with a meal. 12/21/15  Yes Josalyn Funches, MD  methocarbamol (ROBAXIN) 750 MG tablet Take 1 tablet (750 mg total) by mouth every 8 (eight) hours as needed for muscle spasms. 01/11/16  Yes Josalyn Funches, MD  PARoxetine (PAXIL) 20 MG tablet Take 1 tablet (20 mg total) by mouth daily. Patient taking differently: Take 20 mg by mouth every morning.  09/25/15  Yes Josalyn Funches, MD  predniSONE (DELTASONE) 1 MG tablet Take 4 mg by mouth daily with breakfast.    Yes Historical Provider, MD  ranitidine (ZANTAC) 300 MG tablet Take 1 tablet (300 mg total) by mouth at bedtime. 01/11/16  Yes Josalyn Funches, MD  ReliOn Ultra Thin Lancets MISC 1 each by Does not apply route 4 (four) times daily. ICD code E11.65 03/27/15  Yes Boykin Nearing, MD  ULTICARE MICRO PEN NEEDLES 32G X 4 MM MISC USE AS DIRECTED 09/08/15  Yes Josalyn Funches, MD  verapamil (VERELAN PM) 360 MG 24 hr capsule TAKE ONE CAPSULE BY MOUTH EVERY MORNING 04/11/16  Yes Josalyn Funches, MD  albuterol (PROVENTIL HFA;VENTOLIN HFA) 108 (90 Base) MCG/ACT inhaler Inhale 2 puffs into the lungs every 4 (four) hours as needed for wheezing or shortness of breath (((PLAN B))).    Historical Provider, MD  albuterol (PROVENTIL) (2.5 MG/3ML) 0.083% nebulizer solution Take 3 mLs (2.5 mg total) by nebulization every 4 (four) hours as needed for wheezing or shortness of breath (plan C). 08/21/15   Josalyn Funches, MD  atorvastatin (LIPITOR) 20 MG tablet TAKE 1 TABLET BY MOUTH EVERY DAY 04/07/16   Boykin Nearing, MD  chlorpheniramine (CHLOR-TRIMETON) 4 MG tablet Take 2 tablets by mouth at bedtime    Historical Provider, MD  clotrimazole-betamethasone (LOTRISONE) cream Apply to affected area 2 times daily prn 03/20/16   Billy Fischer, MD  dextromethorphan-guaiFENesin [facility] DM) 30-600 MG 12hr tablet Take 1 tablet by mouth 2 (two) times daily as needed (with flutter valve for cough and congestion). Reported on 04/01/2016     Historical Provider, MD  diphenhydrAMINE (BENADRYL) 25 MG tablet Take 1 tablet (25 mg total) by mouth every 6 (six) hours. Take with your dose of reglan Patient not taking: Reported on 04/01/2016 01/03/16   Charlesetta Shanks, MD  doxycycline (VIBRAMYCIN) 100 MG capsule Take 1 capsule (100 mg total) by mouth 2 (two) times daily. 04/12/16   Lysbeth Penner, FNP  fluticasone (CUTIVATE) 0.05 % cream Apply topically 2 (two) times daily. 02/09/16   Josalyn Funches, MD  lidocaine (LIDODERM) 5 % Place 1 patch onto the skin daily. Remove & Discard patch within 12 hours or as directed by MD 01/11/16   Boykin Nearing, MD  methylphenidate (RITALIN LA) 10 MG 24 hr capsule Take 10 mg by mouth daily.    Historical Provider, MD  metoCLOPramide (REGLAN) 10 MG tablet Take 1 tablet (10 mg total) by mouth every 6 (six) hours. 01/11/16   Josalyn Funches, MD  mometasone-formoterol (DULERA) 100-5 MCG/ACT AERO Take 2 puffs first thing in am and then another 2 puffs about 12 hours later. 07/13/15   Tanda Rockers, MD  montelukast (SINGULAIR) 10 MG tablet Take 1 tablet (10 mg total) by mouth at bedtime. 04/01/16   Josalyn Funches, MD  mupirocin ointment (BACTROBAN) 2 % Apply 1 application topically 3 (three) times daily. 02/26/16   Boykin Nearing, MD  omeprazole (PRILOSEC) 20 MG capsule Take 1 capsule (20 mg total) by mouth daily before breakfast. 01/11/16   Boykin Nearing, MD  [place] Calcium 500 MG TABS Take 0.4 tablets (500 mg total) by mouth 2 (two) times daily after a meal. 03/27/15   Boykin Nearing, MD  Respiratory Therapy Supplies (FLUTTER) DEVI Use as directed 09/16/14   Tanda Rockers, MD   Meds Ordered and Administered this Visit  Medications - No data to display  BP 121/71 (BP Location: Right Arm)   Pulse 83   Temp 98.1 F (36.7 C) (Oral)   Resp 12   LMP 03/26/2016 (Exact Date)   SpO2 97%  No data found.   Physical Exam  Constitutional: She appears well-developed and well-nourished.  HENT:  Head:  Normocephalic and atraumatic.  Eyes: Conjunctivae and EOM are normal. Pupils are equal, round, and reactive to light.  Cardiovascular: Normal rate, regular rhythm, normal heart sounds and intact distal pulses.   Pulmonary/Chest: Effort normal and breath sounds normal.  Skin: Skin is warm and dry.  Right  mid forearm with 3 cm diameter cellulitis with pustule.  Nursing note and vitals reviewed.   Urgent Care Course   Clinical Course    .Marland KitchenIncision and Drainage Date/Time: 04/12/2016 4:45 PM Performed by: Lysbeth Penner Authorized by: Ihor Gully D   Consent:    Consent obtained:  Verbal   Consent given by:  Patient   Risks discussed:  Bleeding Location:    Type:  Abscess   Size:  1/2 cm   Location:  Upper extremity   Upper extremity location:  Arm   Arm location:  R lower arm Pre-procedure details:    Skin preparation:  Betadine Procedure details:    Needle aspiration: yes     Needle size:  18 G   Incision types:  Stab incision   Incision depth:  Dermal   Drainage:  Purulent   Drainage amount:  Scant   Packing materials:  None Post-procedure details:    Patient tolerance of procedure:  Tolerated well, no immediate complications   (including critical care time)  Labs Review Labs Reviewed  AEROBIC CULTURE (SUPERFICIAL SPECIMEN)    Imaging Review No results found.   Visual Acuity Review  Right Eye Distance:   Left Eye Distance:   Bilateral Distance:    Right Eye Near:   Left Eye Near:    Bilateral Near:         MDM   1. Abscess and cellulitis    Doxycycline '100mg'$  one po bid x 10 days #20 Wound culture       Lysbeth Penner, FNP 04/12/16 1651

## 2016-04-15 LAB — AEROBIC CULTURE W GRAM STAIN (SUPERFICIAL SPECIMEN)

## 2016-04-15 LAB — AEROBIC CULTURE  (SUPERFICIAL SPECIMEN)

## 2016-04-18 MED FILL — METHOCARBAMOL 750 MG TABLET: 750 | 30 days supply | Qty: 90 | Fill #1

## 2016-04-25 ENCOUNTER — Other Ambulatory Visit: Payer: Self-pay | Admitting: Family Medicine

## 2016-04-25 ENCOUNTER — Encounter: Payer: Self-pay | Admitting: Family Medicine

## 2016-04-25 ENCOUNTER — Ambulatory Visit: Payer: Medicaid Other | Attending: Family Medicine | Admitting: Family Medicine

## 2016-04-25 VITALS — BP 111/73 | HR 92 | Temp 98.6°F | Ht <= 58 in | Wt 188.8 lb

## 2016-04-25 DIAGNOSIS — B351 Tinea unguium: Secondary | ICD-10-CM | POA: Insufficient documentation

## 2016-04-25 DIAGNOSIS — M79676 Pain in unspecified toe(s): Secondary | ICD-10-CM

## 2016-04-25 DIAGNOSIS — E1165 Type 2 diabetes mellitus with hyperglycemia: Secondary | ICD-10-CM | POA: Insufficient documentation

## 2016-04-25 DIAGNOSIS — Z1231 Encounter for screening mammogram for malignant neoplasm of breast: Secondary | ICD-10-CM

## 2016-04-25 DIAGNOSIS — IMO0001 Reserved for inherently not codable concepts without codable children: Secondary | ICD-10-CM

## 2016-04-25 DIAGNOSIS — Z794 Long term (current) use of insulin: Secondary | ICD-10-CM | POA: Insufficient documentation

## 2016-04-25 LAB — GLUCOSE, POCT (MANUAL RESULT ENTRY): POC Glucose: 196 mg/dl — AB (ref 70–99)

## 2016-04-25 LAB — POCT GLYCOSYLATED HEMOGLOBIN (HGB A1C): Hemoglobin A1C: 8

## 2016-04-25 MED ORDER — INSULIN GLARGINE 100 UNIT/ML SOLOSTAR PEN: 120.0000 [IU] | PEN_INJECTOR | Freq: Every day | SUBCUTANEOUS | 11 refills | Status: DC

## 2016-04-25 NOTE — Progress Notes (Signed)
Subjective:  Patient ID: Raven Turner, female    DOB: July 19, 1971  Age: 45 y.o. MRN: [id number]  CC: Diabetes   HPI Shawntee Mainwaring has steroid induced osteoporosis and Cushing syndrome, morbid obesity, fatty liver,  GERD, HTN, DM, Thoracic and lumbar compression fractures presents for    1. CHRONIC DIABETES  Disease Monitoring  Blood Sugar Ranges:   Fasting 105-150  Postprandial 221 -558, followed by Endocrinology   Polyuria: no   Visual problems: no   Medication Compliance: yes  Medication Side Effects  Hypoglycemia: no   Preventitive Health Care  Eye Exam: done recently  Foot Exam: done today    2. R second toe pain: pain and darkening of toenail. There is also darkening of L great toenail.  She has know onychomycosis. She has fatty liver and prefers to not take lamisil. No toe redness or swelling.   3. ED f/u MRSA cellulitis: R posterior forearm. Took doxycycline. No fever, redness or drainage. Still with some induration.   Social History  Substance Use Topics  . Smoking status: Never Smoker  . Smokeless tobacco: Never Used  . Alcohol use No   Outpatient Medications Prior to Visit  Medication Sig Dispense Refill  . acetaminophen-codeine (TYLENOL #3) 300-30 MG tablet TAKE 1 TO 2 TABLETS BY MOUTH AT BEDTIME AS NEEDED FOR MODERATE PAIN 60 tablet 2  . albuterol (PROVENTIL HFA;VENTOLIN HFA) 108 (90 Base) MCG/ACT inhaler Inhale 2 puffs into the lungs every 4 (four) hours as needed for wheezing or shortness of breath (((PLAN B))).    Marland Kitchen albuterol (PROVENTIL) (2.5 MG/3ML) 0.083% nebulizer solution Take 3 mLs (2.5 mg total) by nebulization every 4 (four) hours as needed for wheezing or shortness of breath (plan C). 75 mL 3  . aspirin 325 MG tablet Take 325 mg by mouth every morning.    Marland Kitchen atorvastatin (LIPITOR) 20 MG tablet TAKE 1 TABLET BY MOUTH EVERY DAY 90 tablet 0  . Blood Glucose Monitoring Suppl (ACCU-CHEK AVIVA PLUS) W/DEVICE KIT Used as directed 1 kit 0    . chlorpheniramine (CHLOR-TRIMETON) 4 MG tablet Take 2 tablets by mouth at bedtime    . cholecalciferol (VITAMIN D) 1000 UNITS tablet Take 1,000 Units by mouth every morning.    . clonazePAM (KLONOPIN) 0.5 MG tablet Take 1 tablet (0.5 mg total) by mouth 2 (two) times daily as needed (trouble sleeping and anxiety). 60 tablet 2  . cloNIDine (CATAPRES) 0.2 MG tablet Take 1 tablet (0.2 mg total) by mouth 3 (three) times daily. NEED OV. 90 tablet 0  . clotrimazole-betamethasone (LOTRISONE) cream Apply to affected area 2 times daily prn 45 g 0  . dextromethorphan-guaiFENesin (MUCINEX DM) 30-600 MG 12hr tablet Take 1 tablet by mouth 2 (two) times daily as needed (with flutter valve for cough and congestion). Reported on 04/01/2016    . diphenhydrAMINE (BENADRYL) 25 MG tablet Take 1 tablet (25 mg total) by mouth every 6 (six) hours. Take with your dose of reglan (Patient not taking: Reported on 04/01/2016) 30 tablet 0  . doxycycline (VIBRAMYCIN) 100 MG capsule Take 1 capsule (100 mg total) by mouth 2 (two) times daily. 20 capsule 0  . ferrous sulfate 325 (65 FE) MG tablet Take 1 tablet (325 mg total) by mouth daily with breakfast. 30 tablet 3  . fluticasone (CUTIVATE) 0.05 % cream Apply topically 2 (two) times daily. 30 g 0  . FOLIC ACID PO Take 1 tablet by mouth every morning.    . furosemide (LASIX) 40 MG tablet  Take 1 tablet (40 mg total) by mouth every morning. 30 tablet 2  . gabapentin (NEURONTIN) 400 MG capsule Take 2 capsules (800 mg total) by mouth 3 (three) times daily. 180 capsule 5  . glucose blood (RELION GLUCOSE TEST STRIPS) test strip 1 each by Other route 4 (four) times daily. ICD code E11.65 400 each 3  . insulin aspart (NOVOLOG) 100 UNIT/ML injection Inject 100 Units into the skin 3 (three) times daily with meals. 9 vial 3  . Insulin Glargine (LANTUS SOLOSTAR) 100 UNIT/ML Solostar Pen Inject 100 Units into the skin daily. 10 pen 11  . Insulin Pen Needle (B-D ULTRAFINE III SHORT PEN) 31G X 8  MM MISC 1 application by Does not apply route daily. 100 each 3  . Insulin Syringe-Needle U-100 (BD INSULIN SYRINGE ULTRAFINE) 31G X 15/64" 1 ML MISC 1 each by Does not apply route 3 (three) times daily. 300 each 3  . Lancet Devices (ACCU-CHEK SOFTCLIX) lancets Use as instructed 1 each 0  . lidocaine (LIDODERM) 5 % Place 1 patch onto the skin daily. Remove & Discard patch within 12 hours or as directed by MD 30 patch 0  . losartan (COZAAR) 100 MG tablet Take 1 tablet (100 mg total) by mouth every morning. 30 tablet 3  . metFORMIN (GLUCOPHAGE) 1000 MG tablet Take 1 tablet (1,000 mg total) by mouth 2 (two) times daily with a meal. 60 tablet 11  . methocarbamol (ROBAXIN) 750 MG tablet Take 1 tablet (750 mg total) by mouth every 8 (eight) hours as needed for muscle spasms. 90 tablet 11  . methylphenidate (RITALIN LA) 10 MG 24 hr capsule Take 10 mg by mouth daily.    . metoCLOPramide (REGLAN) 10 MG tablet Take 1 tablet (10 mg total) by mouth every 6 (six) hours. 30 tablet 3  . mometasone-formoterol (DULERA) 100-5 MCG/ACT AERO Take 2 puffs first thing in am and then another 2 puffs about 12 hours later. 1 Inhaler 11  . montelukast (SINGULAIR) 10 MG tablet Take 1 tablet (10 mg total) by mouth at bedtime. 30 tablet 2  . mupirocin ointment (BACTROBAN) 2 % Apply 1 application topically 3 (three) times daily. 22 g 0  . omeprazole (PRILOSEC) 20 MG capsule Take 1 capsule (20 mg total) by mouth daily before breakfast. 30 capsule 11  . [place] Calcium 500 MG TABS Take 0.4 tablets (500 mg total) by mouth 2 (two) times daily after a meal. 60 tablet 11  . PARoxetine (PAXIL) 20 MG tablet Take 1 tablet (20 mg total) by mouth daily. (Patient taking differently: Take 20 mg by mouth every morning. ) 30 tablet 5  . predniSONE (DELTASONE) 1 MG tablet Take 4 mg by mouth daily with breakfast.     . ranitidine (ZANTAC) 300 MG tablet Take 1 tablet (300 mg total) by mouth at bedtime. 30 tablet 5  . ReliOn Ultra Thin Lancets  MISC 1 each by Does not apply route 4 (four) times daily. ICD code E11.65 400 each 3  . Respiratory Therapy Supplies (FLUTTER) DEVI Use as directed 1 each 0  . ULTICARE MICRO PEN NEEDLES 32G X 4 MM MISC USE AS DIRECTED 100 each 2  . verapamil (VERELAN PM) 360 MG 24 hr capsule TAKE ONE CAPSULE BY MOUTH EVERY MORNING 30 capsule 2   Facility-Administered Medications Prior to Visit  Medication Dose Route Frequency Provider Last Rate Last Dose  . DOBUTamine (DOBUTREX) 1,000 mcg/mL in dextrose 5% 250 mL infusion  30 mcg/kg/min Intravenous Continuous  Mihai Croitoru, MD 145.3 mL/hr at 04/22/15 1300 30 mcg/kg/min at 04/22/15 1300    ROS Review of Systems  Constitutional: Negative for chills and fever.  HENT: Negative for congestion.   Eyes: Negative for visual disturbance.  Respiratory: Negative for cough and shortness of breath.   Cardiovascular: Negative for chest pain.  Gastrointestinal: Negative for abdominal distention, abdominal pain, anal bleeding, blood in stool, constipation, diarrhea, nausea, rectal pain and vomiting.  Musculoskeletal: Positive for arthralgias, back pain and myalgias.  Skin: Positive for rash. Negative for wound.  Allergic/Immunologic: Negative for immunocompromised state.  Hematological: Negative for adenopathy. Does not bruise/bleed easily.  Psychiatric/Behavioral: Negative for dysphoric mood and suicidal ideas.    Objective:  BP 111/73 (BP Location: Left Arm, Patient Position: Sitting, Cuff Size: Large)   Pulse 92   Temp 98.6 F (37 C) (Oral)   Ht '4\' 10"'$  (1.473 m)   Wt 188 lb 12.8 oz (85.6 kg)   LMP 03/26/2016 (Exact Date)   SpO2 97%   BMI 39.46 kg/m   BP/Weight 04/25/2016 04/12/2016 04/06/3663  Systolic BP 403 474 259  Diastolic BP 73 71 82  Wt. (Lbs) 188.8 - 186  BMI 39.46 - 38.88    Physical Exam  Constitutional: She is oriented to person, place, and time. She appears well-developed and well-nourished. No distress.  Obese   HENT:  Head:  Normocephalic and atraumatic.  Cardiovascular: Normal rate, regular rhythm, normal heart sounds and intact distal pulses.   Pulmonary/Chest: Effort normal and breath sounds normal.  Musculoskeletal: She exhibits no edema.       Feet:  Neurological: She is alert and oriented to person, place, and time.  Skin: Skin is warm and dry. No rash noted.     Psychiatric: She has a normal mood and affect.   Lab Results  Component Value Date   HGBA1C 8.0 12/21/2015   Lab Results  Component Value Date   HGBA1C 8.0 12/21/2015    CBG 196   Assessment & Plan:   Tabrina was seen today for diabetes.  Diagnoses and all orders for this visit:  Uncontrolled type 2 diabetes mellitus without complication, with long-term current use of insulin (HCC) -     HgB A1c -     Glucose (CBG)  Uncontrolled diabetes mellitus type 2 without complications, unspecified long term insulin use status (HCC) -     Insulin Glargine (LANTUS SOLOSTAR) 100 UNIT/ML Solostar Pen; Inject 120 Units into the skin daily.  Pain due to onychomycosis of toenail -     Ambulatory referral to Podiatry    No orders of the defined types were placed in this encounter.   Follow-up: No Follow-up on file.   Boykin Nearing MD

## 2016-04-25 NOTE — Assessment & Plan Note (Signed)
Stable A1c of 8 with hyperglycemia at home Continue lantus, increase from 100 to 120 U daily Continue novolog 100 U BID Advised monitoring sugars if CBG > 300 take additional 20 U of novolog  Keep f/u with endocrinology and with me

## 2016-04-25 NOTE — Patient Instructions (Addendum)
Raven Turner was seen today for diabetes.  Diagnoses and all orders for this visit:  Uncontrolled type 2 diabetes mellitus without complication, with long-term current use of insulin (HCC) -     HgB A1c -     Glucose (CBG)  Uncontrolled diabetes mellitus type 2 without complications, unspecified long term insulin use status (HCC) -     Insulin Glargine (LANTUS SOLOSTAR) 100 UNIT/ML Solostar Pen; Inject 120 Units into the skin daily.  Pain due to onychomycosis of toenail -     Ambulatory referral to Podiatry  increase lantus to 120 U nightly   If blood sugar is over 300  after a meal and you have already taking 100 U of novolog , take an additional 20 U of novolog with a small snack of protein like a little meat and cheese.   Write down sugar level and time anytime you have to take additional novolog.  F/u for flu shot visit in 4 weeks  F/u OV with me in 3 months   Dr. Raelyn Ensign

## 2016-04-25 NOTE — Assessment & Plan Note (Signed)
Podiatry referral placed.

## 2016-04-28 ENCOUNTER — Other Ambulatory Visit: Payer: Self-pay | Admitting: Family Medicine

## 2016-04-28 DIAGNOSIS — J45998 Other asthma: Secondary | ICD-10-CM

## 2016-05-03 ENCOUNTER — Ambulatory Visit
Admission: RE | Admit: 2016-05-03 | Discharge: 2016-05-03 | Disposition: A | Discharge status: Home / Self Care | Payer: Medicaid Other | Source: Ambulatory Visit | Attending: Family Medicine | Admitting: Family Medicine

## 2016-05-03 DIAGNOSIS — Z1231 Encounter for screening mammogram for malignant neoplasm of breast: Secondary | ICD-10-CM

## 2016-05-06 ENCOUNTER — Other Ambulatory Visit: Payer: Self-pay | Admitting: Family Medicine

## 2016-05-06 DIAGNOSIS — F411 Generalized anxiety disorder: Secondary | ICD-10-CM

## 2016-05-09 ENCOUNTER — Telehealth: Payer: Self-pay | Admitting: Family Medicine

## 2016-05-09 NOTE — Telephone Encounter (Signed)
Patient is needing prednisone, paxil. Please follow up.

## 2016-05-09 NOTE — Telephone Encounter (Signed)
I have already refilled the paroxetine, will have to forward request for prednisone to Dr. Adrian Blackwater.

## 2016-05-12 MED ORDER — PREDNISONE 1 MG PO TABS
4.0000 mg | ORAL_TABLET | Freq: Every day | ORAL | 0 refills | Status: DC
Start: 2016-05-12 — End: 2016-11-03

## 2016-05-17 ENCOUNTER — Encounter: Payer: Self-pay | Admitting: Podiatry

## 2016-05-17 ENCOUNTER — Ambulatory Visit (INDEPENDENT_AMBULATORY_CARE_PROVIDER_SITE_OTHER): Payer: Medicaid Other | Admitting: Podiatry

## 2016-05-17 DIAGNOSIS — M79676 Pain in unspecified toe(s): Secondary | ICD-10-CM

## 2016-05-17 DIAGNOSIS — B351 Tinea unguium: Secondary | ICD-10-CM | POA: Diagnosis not present

## 2016-05-17 NOTE — Progress Notes (Signed)
This patient presents to the office with chief complaint of painful second toe right foot and great toe left foot. This patient is Spanish and it is difficult to understand her problem. She was previously seen by Dr. Amalia Hailey for her painful nails which she has treated multiple time. I believe she tried to request additional and definitive treatment for her mycotic toenails. She states her right second and left great toenails are painful due to the thickness. She presents the office today for an evaluation and treatment  GENERAL APPEARANCE: Alert, conversant. Appropriately groomed. No acute distress.  VASCULAR: Pedal pulses are  palpable at  [facility] and PT bilateral.  Capillary refill time is immediate to all digits,  Normal temperature gradient.   NEUROLOGIC: sensation is diminished  to 5.07 monofilament at 5/5 sites bilateral.  Light touch is intact bilateral, Muscle strength normal.  MUSCULOSKELETAL: acceptable muscle strength, tone and stability bilateral.  Intrinsic muscluature intact bilateral.  Rectus appearance of foot and digits noted bilateral.   DERMATOLOGIC: skin color, texture, and turgor are within normal limits.  No preulcerative lesions or ulcers  are seen, no interdigital maceration noted.  No open lesions present.  . No drainage noted.  Nails are thick and disfigured especially second toenail right foot and great toenail left foot.   Onychomycosis  B/L   Debridement and grinding of long thick painful nails. Nail sample was taken from left hallux and is to be sent to [facility].  RTC 3 weeks.  I informed this patient if  she wants to discuss additional treatment, she should bring an interpreter.  After she left the treatment room. I was informed w e have a machine that helps interpret Spanish speaking patients. If she returns with no interpreter then I will use this machine to discuss definitive treatment and the results of the nail sample.     Gardiner Barefoot DPM

## 2016-05-18 ENCOUNTER — Other Ambulatory Visit: Payer: Self-pay | Admitting: Family Medicine

## 2016-05-18 DIAGNOSIS — F411 Generalized anxiety disorder: Secondary | ICD-10-CM

## 2016-05-20 NOTE — Telephone Encounter (Signed)
Pt was called 9/8 and informed of her refill for klonopin.

## 2016-05-20 NOTE — Telephone Encounter (Signed)
Klonopin refilled Please inform patient

## 2016-05-26 ENCOUNTER — Encounter: Payer: Self-pay | Admitting: Podiatry

## 2016-05-27 ENCOUNTER — Other Ambulatory Visit: Payer: Self-pay | Admitting: Gastroenterology

## 2016-05-27 ENCOUNTER — Other Ambulatory Visit (HOSPITAL_COMMUNITY): Payer: Self-pay | Admitting: Gastroenterology

## 2016-05-27 DIAGNOSIS — K2970 Gastritis, unspecified, without bleeding: Secondary | ICD-10-CM

## 2016-06-07 ENCOUNTER — Telehealth: Payer: Self-pay | Admitting: Family Medicine

## 2016-06-07 DIAGNOSIS — F411 Generalized anxiety disorder: Secondary | ICD-10-CM

## 2016-06-07 MED ORDER — PAROXETINE HCL 20 MG PO TABS: 20.0000 mg | ORAL_TABLET | Freq: Every day | ORAL | 3 refills | Status: DC

## 2016-06-07 NOTE — Telephone Encounter (Signed)
Refilled paroxetine. Clonazepam is not due for a refill at this time.

## 2016-06-07 NOTE — Telephone Encounter (Signed)
Patient called the office to request refills on her depression and anxiety medication. Medications need to be called in to [facility] on [address]. Please follow up.  Thank you.

## 2016-06-08 ENCOUNTER — Ambulatory Visit (HOSPITAL_COMMUNITY): Payer: Medicaid Other

## 2016-06-08 ENCOUNTER — Telehealth: Payer: Self-pay | Admitting: *Deleted

## 2016-06-08 NOTE — Telephone Encounter (Signed)
Dr. Prudence Davidson reviewed 05/17/2016 fungal culture results as negative, not treatment necessary. Left message informing pt of results and to call with concerns.

## 2016-06-10 ENCOUNTER — Ambulatory Visit: Payer: Medicaid Other | Admitting: Podiatry

## 2016-06-13 ENCOUNTER — Ambulatory Visit (HOSPITAL_COMMUNITY)
Admission: RE | Admit: 2016-06-13 | Discharge: 2016-06-13 | Disposition: A | Discharge status: Home / Self Care | Payer: Medicaid Other | Source: Ambulatory Visit | Attending: Gastroenterology | Admitting: Gastroenterology

## 2016-06-13 DIAGNOSIS — K30 Functional dyspepsia: Secondary | ICD-10-CM | POA: Insufficient documentation

## 2016-06-13 DIAGNOSIS — K2970 Gastritis, unspecified, without bleeding: Secondary | ICD-10-CM | POA: Insufficient documentation

## 2016-06-13 MED ORDER — TECHNETIUM TC 99M SULFUR COLLOID
2.0000 | Freq: Once | INTRAVENOUS | Status: AC | PRN
  Administered 2016-06-13: 2 via ORAL

## 2016-06-20 ENCOUNTER — Other Ambulatory Visit: Payer: Self-pay | Admitting: Family Medicine

## 2016-06-20 DIAGNOSIS — F411 Generalized anxiety disorder: Secondary | ICD-10-CM

## 2016-06-21 ENCOUNTER — Telehealth: Payer: Self-pay | Admitting: Family Medicine

## 2016-06-21 NOTE — Telephone Encounter (Signed)
Call placed to [place] interpreter ID [phone], Message left advising patient request is complete and prescription is upfront ready for pick up.

## 2016-06-21 NOTE — Telephone Encounter (Signed)
Patient called the office to request refill for methocarbamol (ROBAXIN) 750 MG tablet. Please call it in to [facility] on [address].  Thank you.

## 2016-06-21 NOTE — Telephone Encounter (Signed)
Please inform patient that clonazepam is ready for pick up

## 2016-06-22 NOTE — Telephone Encounter (Signed)
Left patient a message regarding her medication and how she needs to contact [facility] to request refill for her prescription.

## 2016-06-22 NOTE — Telephone Encounter (Signed)
She is not due for a methocarbamol refill. Dr. Adrian Blackwater prescribed a 30 day supply with 11 refills in May 2017. Patient needs to follow up with [facility] for additional refills.

## 2016-06-23 ENCOUNTER — Telehealth: Payer: Self-pay | Admitting: Family Medicine

## 2016-06-23 DIAGNOSIS — S32010S Wedge compression fracture of first lumbar vertebra, sequela: Secondary | ICD-10-CM

## 2016-06-23 DIAGNOSIS — F411 Generalized anxiety disorder: Secondary | ICD-10-CM

## 2016-06-23 MED ORDER — CLONAZEPAM 0.5 MG PO TABS: ORAL_TABLET | ORAL | 2 refills | Status: DC

## 2016-06-23 MED ORDER — OYSTER SHELL CALCIUM 500 MG PO TABS: 500.0000 mg | ORAL_TABLET | Freq: Every day | ORAL | 0 refills | Status: DC

## 2016-06-23 NOTE — Telephone Encounter (Signed)
Patient is needing a refill for,  Clonazepam and [employer]. Please follow up.

## 2016-06-23 NOTE — Telephone Encounter (Signed)
Clonazepam ready for pick up Please inform patient

## 2016-06-23 NOTE — Telephone Encounter (Signed)
Refilled calcium. Will defer clonazepam to Dr. Adrian Blackwater.

## 2016-06-24 ENCOUNTER — Telehealth: Payer: Self-pay

## 2016-06-24 ENCOUNTER — Encounter: Payer: Self-pay | Admitting: Podiatry

## 2016-06-24 ENCOUNTER — Ambulatory Visit (INDEPENDENT_AMBULATORY_CARE_PROVIDER_SITE_OTHER): Payer: Medicaid Other | Admitting: Podiatry

## 2016-06-24 VITALS — BP 147/76 | HR 73 | Resp 16

## 2016-06-24 DIAGNOSIS — B351 Tinea unguium: Secondary | ICD-10-CM | POA: Diagnosis not present

## 2016-06-24 DIAGNOSIS — M79676 Pain in unspecified toe(s): Secondary | ICD-10-CM | POA: Diagnosis not present

## 2016-06-24 NOTE — Progress Notes (Signed)
This patient presents to the office with chief complaint of painful big toenail, left foot and second toenail right foot. Her results from big toe came back and had minimal fungal infection. She presents the office for an explanation of the lab results  GENERAL APPEARANCE: Alert, conversant. Appropriately groomed. No acute distress.  VASCULAR: Pedal pulses are  palpable at  [facility] and PT bilateral.  Capillary refill time is immediate to all digits,  Normal temperature gradient.   NEUROLOGIC: sensation is diminished  to 5.07 monofilament at 5/5 sites bilateral.  Light touch is intact bilateral, Muscle strength normal.  MUSCULOSKELETAL: acceptable muscle strength, tone and stability bilateral.  Intrinsic muscluature intact bilateral.  Rectus appearance of foot and digits noted bilateral.   DERMATOLOGIC: skin color, texture, and turgor are within normal limits.  No preulcerative lesions or ulcers  are seen, no interdigital maceration noted.  No open lesions present.  . No drainage noted.  Nails are thick and disfigured especially second toenail right foot and great toenail left foot.   Onychomycosis  B/L   Debridement and grinding of long thick painful nails. Patient was told the results from the lab have minimal fungal infection to her painful nails. She says that her second toe is the most painful toe. We discussed surgical correction for the permanent removal of the second toenail right foot. Also, she has pain through the arch of both feet. I will show her power step insoles that can be worn in her shoes. If the problem persists, she needs an appointment for an evaluation of her foot and arch pain   Gardiner Barefoot DPM   Gardiner Barefoot DPM

## 2016-06-24 NOTE — Telephone Encounter (Signed)
Pt was called on 10/13 interpreter was not able to get pt on the phone, script will be in medication log book.

## 2016-07-04 ENCOUNTER — Other Ambulatory Visit: Payer: Self-pay | Admitting: Family Medicine

## 2016-07-04 ENCOUNTER — Encounter: Payer: Self-pay | Admitting: Pulmonary Disease

## 2016-07-04 ENCOUNTER — Ambulatory Visit (INDEPENDENT_AMBULATORY_CARE_PROVIDER_SITE_OTHER): Payer: Medicaid Other | Admitting: Pulmonary Disease

## 2016-07-04 DIAGNOSIS — R05 Cough: Secondary | ICD-10-CM

## 2016-07-04 DIAGNOSIS — G4733 Obstructive sleep apnea (adult) (pediatric): Secondary | ICD-10-CM | POA: Diagnosis not present

## 2016-07-04 DIAGNOSIS — M818 Other osteoporosis without current pathological fracture: Secondary | ICD-10-CM | POA: Diagnosis not present

## 2016-07-04 DIAGNOSIS — J45998 Other asthma: Secondary | ICD-10-CM | POA: Diagnosis not present

## 2016-07-04 DIAGNOSIS — I10 Essential (primary) hypertension: Secondary | ICD-10-CM

## 2016-07-04 DIAGNOSIS — T380X5A Adverse effect of glucocorticoids and synthetic analogues, initial encounter: Secondary | ICD-10-CM | POA: Diagnosis not present

## 2016-07-04 DIAGNOSIS — R058 Other specified cough: Secondary | ICD-10-CM

## 2016-07-04 NOTE — Assessment & Plan Note (Signed)
Her evaluation at [facility] showed no evidence of paradoxical vocal cord movement. However, her wheezes all upper airway so I suppose this is most likely related to postnasal drip related mucus production. Again, I'm not suspicious that this is related to severe asthma.  Plan: XL nitric oxide testing today Ad spacer for Dulera Flu shot is up-to-date Continue to try to slowly wheeze prednisone next visit

## 2016-07-04 NOTE — Progress Notes (Signed)
Subjective:    Patient ID: Raven Turner, female    DOB: 08/21/1971, 44 y.o.   MRN: [id number]  Synopsis: Former patient of Dr. Melvyn Novas with recurrent wheeze, shortness of breath and cough felt to be due to either vocal cord dysfunction or asthma.  Dr. Melvyn Novas summarized the workup for her symptoms as follows: 09/05/2014  try dulera 100 2bid and off advair   - 09/16/2014 off theoph and brethine    - 11/03/14  PFTs p am dulera 100 on 30 mg prednisone  FEV1 1.56(73%) ratio 85 =  No obst, including fef 25-75%, only abn is reduction in ERV -med calendar 11/17/2014 , 02/17/2015  - 12/2014 changed to physiologic steroids  - 01/12/2015 p extensive coaching HFA effectiveness = 75%  - spirometry 03/31/15 1.48 (62%) ratio 82  p am dulera and while symptomatic requesting saba  05/12/2015 med calendar , redone 10/13/2015> did not bring 12/11/2015  - 12/11/2015  extensive coaching HFA effectiveness =     75% from as baseline of < 50 (ti   too short)  Evaluated by [facility] in 2017, no paradoxical movements of the vocal cords noted   HPI Chief Complaint  Patient presents with  . Follow-up    Pt states her SOB is unchanged since last OV. Pt c/o wheezing every day. Pt denies cough and CP/tightness and f/c/s.    She says that she went to the ENT doctor in [facility] to produce a lot of phlegm, from the back of her throat.  Typically has this in the mornings when she gets up.  If she walks a little bit she will cough up yellow mucus, typically a table spoon or less.  It is more when she walks.   No bronchitis since the last visit.  No flare ups of asthma..  She still uses Dulera regularly.  She continues to take pro-Air and albuterol nebulizer (she thinks the neb works better), twice a day.  Sh thinks her dyspnea is related ot her weight.  She is having a hard time exercising due to stomach problems.    She continues to have trouble sleeping at night. She says that her sister  has noticed her stopping breathing in the middle the night. She snores. She feels fatigue during the daytime.  Past Medical History:  Diagnosis Date  . Anemia   . Asthma   . Bilateral hand numbness 06/25/2015  . Bone pain 02/27/2015   Diffuse bone pain in legs mostly but also in arms    . Breast pain, left 03/27/2015  . Callus of heel 12/16/2014  . Compression fracture   . Compression fracture of L1 lumbar vertebra (HCC) 08/26/2014  . Compression fracture of T12 vertebra ([place]) 08/28/2014  . COPD (chronic obstructive pulmonary disease) ([place]) 2013  . Cushing syndrome ([place]) 2013   . Depression   . Diabetes ([place]) 2013   . Diabetes mellitus type 2, uncontrolled ([place]) 08/26/2014   Sees Dr. Katy Fitch.  Last visit 11/27/14.  No diabetic retinopathy    . Diabetes mellitus with neuropathy ([place])   . Diastolic dysfunction without heart failure 10/14/2014   Grade 2 diastolic dysfunction 2/2 pulm HTN   . Generalized anxiety disorder 03/27/2015  . HTN (hypertension) 2005   . Hx of cataract surgery 11/03/2014  . LUQ abdominal pain 07/17/2015  . Medication management 05/12/2015  . Morbid obesity ([place]) 09/08/2014  . 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 Non-English speaking patient 09/08/2014  .  OSA (obstructive sleep apnea) 05/20/2015  . Osteopenia 11/19/2013   Dx with osteopenia in lumbar vertebra with partial compression of L1  . Pain due to onychomycosis of toenail 03/27/2015  . Secondary Cushing's syndrome/ iatrogenic   . Skin rash 07/17/2015  . Tachycardia 01/13/2015  . Upper airway cough syndrome 09/05/2014   Followed in Pulmonary clinic/ [facility]/ [place]  - Trial off advair      09/05/2014 >>>  - trial off theoph 09/17/2014 and added flutter  - rec reduce dulera to 100 2 bid 07/13/2015     . Vision changes 06/25/2015  . Vitamin D deficiency 08/28/2014     Family History  Problem Relation Age of Onset  . Diabetes Mother   . Stroke Father   . Asthma Father   . Hypertension Sister   . Hypertension  Brother      Social History   Social History  . Marital status: Married    Spouse name: N/A  . Number of children: N/A  . Years of education: N/A   Occupational History  . Not on file.   Social History Main Topics  . Smoking status: Never Smoker  . Smokeless tobacco: Never Used  . Alcohol use No  . Drug use: No  . Sexual activity: Yes    Birth control/ protection: Surgical   Other Topics Concern  . Not on file   Social History Narrative   Lives at home with husband.  No children   She does not work   Highest level of education:  12th grade     Allergies  Allergen Reactions  . Shellfish Allergy Anaphylaxis  . Septra [Sulfamethoxazole-Trimethoprim] Rash, Other (See Comments) and Itching    Facial redness with pruritus  Facial redness with pruritus   . Hydrocortisone Rash     Outpatient Medications Prior to Visit  Medication Sig Dispense Refill  . acetaminophen-codeine (TYLENOL #3) 300-30 MG tablet TAKE 1 TO 2 TABLETS BY MOUTH AT BEDTIME AS NEEDED FOR MODERATE PAIN 60 tablet 2  . albuterol (PROVENTIL HFA;VENTOLIN HFA) 108 (90 Base) MCG/ACT inhaler Inhale 2 puffs into the lungs every 4 (four) hours as needed for wheezing or shortness of breath (((PLAN B))).    Marland Kitchen albuterol (PROVENTIL) (2.5 MG/3ML) 0.083% nebulizer solution USE 1 VIAL VIA NEBULIZER EVERY 4 HOURS AS NEEDED WHEEZING OR SHORTNESS OF BREATH 90 mL 3  . aspirin 325 MG tablet Take 325 mg by mouth every morning.    Marland Kitchen atorvastatin (LIPITOR) 20 MG tablet TAKE 1 TABLET BY MOUTH EVERY DAY 90 tablet 0  . Blood Glucose Monitoring Suppl (ACCU-CHEK AVIVA PLUS) W/DEVICE KIT Used as directed 1 kit 0  . buprenorphine (BUTRANS - DOSED MCG/HR) 5 MCG/HR PTWK patch Place 5 mcg onto the skin once a week.    . chlorpheniramine (CHLOR-TRIMETON) 4 MG tablet Take 2 tablets by mouth at bedtime    . cholecalciferol (VITAMIN D) 1000 UNITS tablet Take 1,000 Units by mouth every morning.    . clonazePAM (KLONOPIN) 0.5 MG tablet TAKE 1  TABLET BY MOUTH TWICE DAILY AS NEEDED FOR ANXIETY OR SLEEP 60 tablet 2  . cloNIDine (CATAPRES) 0.2 MG tablet Take 1 tablet (0.2 mg total) by mouth 3 (three) times daily. NEED OV. 90 tablet 0  . clotrimazole-betamethasone (LOTRISONE) cream Apply to affected area 2 times daily prn 45 g 0  . dextromethorphan-guaiFENesin (MUCINEX DM) 30-600 MG 12hr tablet Take 1 tablet by mouth 2 (two) times daily as needed (with flutter valve for cough  and congestion). Reported on 04/01/2016    . ferrous sulfate 325 (65 FE) MG tablet Take 1 tablet (325 mg total) by mouth daily with breakfast. 30 tablet 3  . fluticasone (CUTIVATE) 0.05 % cream Apply topically 2 (two) times daily. 30 g 0  . FOLIC ACID PO Take 1 tablet by mouth every morning.    . furosemide (LASIX) 40 MG tablet Take 1 tablet (40 mg total) by mouth every morning. 30 tablet 2  . gabapentin (NEURONTIN) 400 MG capsule Take 2 capsules (800 mg total) by mouth 3 (three) times daily. 180 capsule 5  . glucose blood (RELION GLUCOSE TEST STRIPS) test strip 1 each by Other route 4 (four) times daily. ICD code E11.65 400 each 3  . Insulin Pen Needle (B-D ULTRAFINE III SHORT PEN) 31G X 8 MM MISC 1 application by Does not apply route daily. 100 each 3  . Insulin Syringe-Needle U-100 (BD INSULIN SYRINGE ULTRAFINE) 31G X 15/64" 1 ML MISC 1 each by Does not apply route 3 (three) times daily. 300 each 3  . Lancet Devices (ACCU-CHEK SOFTCLIX) lancets Use as instructed 1 each 0  . lidocaine (LIDODERM) 5 % Place 1 patch onto the skin daily. Remove & Discard patch within 12 hours or as directed by MD 30 patch 0  . losartan (COZAAR) 100 MG tablet Take 1 tablet (100 mg total) by mouth every morning. 30 tablet 3  . metFORMIN (GLUCOPHAGE) 1000 MG tablet Take 1 tablet (1,000 mg total) by mouth 2 (two) times daily with a meal. 60 tablet 11  . methocarbamol (ROBAXIN) 750 MG tablet Take 1 tablet (750 mg total) by mouth every 8 (eight) hours as needed for muscle spasms. 90 tablet 11  .  methylphenidate (RITALIN LA) 10 MG 24 hr capsule Take 10 mg by mouth daily.    . metoCLOPramide (REGLAN) 10 MG tablet Take 1 tablet (10 mg total) by mouth every 6 (six) hours. 30 tablet 3  . mometasone-formoterol (DULERA) 100-5 MCG/ACT AERO Take 2 puffs first thing in am and then another 2 puffs about 12 hours later. 1 Inhaler 11  . montelukast (SINGULAIR) 10 MG tablet Take 1 tablet (10 mg total) by mouth at bedtime. 30 tablet 2  . omeprazole (PRILOSEC) 20 MG capsule Take 1 capsule (20 mg total) by mouth daily before breakfast. 30 capsule 11  . Oyster Shell Calcium 500 MG TABS Take 1 tablet (1,250 mg total) by mouth daily. 90 tablet 0  . PARoxetine (PAXIL) 20 MG tablet Take 1 tablet (20 mg total) by mouth daily. 30 tablet 3  . predniSONE (DELTASONE) 1 MG tablet Take 4 tablets (4 mg total) by mouth daily with breakfast. 120 tablet 0  . ranitidine (ZANTAC) 300 MG tablet Take 1 tablet (300 mg total) by mouth at bedtime. 30 tablet 5  . ReliOn Ultra Thin Lancets MISC 1 each by Does not apply route 4 (four) times daily. ICD code E11.65 400 each 3  . Respiratory Therapy Supplies (FLUTTER) DEVI Use as directed 1 each 0  . ULTICARE MICRO PEN NEEDLES 32G X 4 MM MISC USE AS DIRECTED 100 each 2  . verapamil (VERELAN PM) 360 MG 24 hr capsule TAKE ONE CAPSULE BY MOUTH EVERY MORNING 30 capsule 2  . insulin aspart (NOVOLOG) 100 UNIT/ML injection Inject 100 Units into the skin 3 (three) times daily with meals. 9 vial 3  . Insulin Glargine (LANTUS SOLOSTAR) 100 UNIT/ML Solostar Pen Inject 120 Units into the skin daily. 10 pen 11  Facility-Administered Medications Prior to Visit  Medication Dose Route Frequency Provider Last Rate Last Dose  . DOBUTamine (DOBUTREX) 1,000 mcg/mL in dextrose 5% 250 mL infusion  30 mcg/kg/min Intravenous Continuous Mihai Croitoru, MD 145.3 mL/hr at 04/22/15 1300 30 mcg/kg/min at 04/22/15 1300       Review of Systems  Constitutional: Negative for chills, fatigue and fever.  HENT:  Negative for postnasal drip, rhinorrhea and sinus pressure.   Respiratory: Positive for cough, shortness of breath and wheezing.   Cardiovascular: Negative for chest pain, palpitations and leg swelling.       Objective:   Physical Exam Vitals:   07/04/16 1512  BP: (!) 146/84  Pulse: 74  SpO2: 97%  Weight: 192 lb 6.4 oz (87.3 kg)  Height: 4' 10" (1.473 m)  RA  Gen: well appearing HENT: OP clear, TM's clear, neck supple PULM: Upper airway wheeze, normal percussion CV: RRR, no mgr, trace edema GI: BS+, soft, nontender Derm: no cyanosis or rash Psyche: normal mood and affect  Sleep study October 2016 showed that she slept 370 minutes, no apnea events  July 2017 IgE 55 (normal), CBC without eosinophils     Assessment & Plan:  Steroid-induced osteoporosis We will continue to try to slowly taper down prednisone. She will continue at 4 mg daily as of today, but we will continue to make effort to reduce this.  Upper airway cough syndrome She has some upper airway wheeze, I suspect this is primarily related to postnasal drip. She saw ear nose and throat he said that she does not have vocal cord dysfunction. I do not suspect that her wheeze is coming from asthma.  Plan: Use Mucinex twice a day Drink plenty of liquids Voice rest   Asthma ? severity vs VCD/ pseudo asthma Her evaluation at [facility] showed no evidence of paradoxical vocal cord movement. However, her wheezes all upper airway so I suppose this is most likely related to postnasal drip related mucus production. Again, I'm not suspicious that this is related to severe asthma.  Plan: XL nitric oxide testing today Ad spacer for Dulera Flu shot is up-to-date Continue to try to slowly wheeze prednisone next visit  OSA (obstructive sleep apnea) She continues to describe symptoms of this problem. However, her sleep study did not show evidence of sleep apnea. She insists that she snores, and that her sister has witnessed  her not breathe. She says that she did not sleep very well during the sleep study at [facility]. She also tells me that she sawmy partner in passing when she came for a visit with her sister recently and he suggested that she have a home sleep study.   Plan: Home sleep study    Current Outpatient Prescriptions:  .  acetaminophen-codeine (TYLENOL #3) 300-30 MG tablet, TAKE 1 TO 2 TABLETS BY MOUTH AT BEDTIME AS NEEDED FOR MODERATE PAIN, Disp: 60 tablet, Rfl: 2 .  albuterol (PROVENTIL HFA;VENTOLIN HFA) 108 (90 Base) MCG/ACT inhaler, Inhale 2 puffs into the lungs every 4 (four) hours as needed for wheezing or shortness of breath (((PLAN B)))., Disp: , Rfl:  .  albuterol (PROVENTIL) (2.5 MG/3ML) 0.083% nebulizer solution, USE 1 VIAL VIA NEBULIZER EVERY 4 HOURS AS NEEDED WHEEZING OR SHORTNESS OF BREATH, Disp: 90 mL, Rfl: 3 .  aspirin 325 MG tablet, Take 325 mg by mouth every morning., Disp: , Rfl:  .  atorvastatin (LIPITOR) 20 MG tablet, TAKE 1 TABLET BY MOUTH EVERY DAY, Disp: 90 tablet, Rfl: 0 .  Blood Glucose Monitoring Suppl (ACCU-CHEK AVIVA PLUS) W/DEVICE KIT, Used as directed, Disp: 1 kit, Rfl: 0 .  buprenorphine (BUTRANS - DOSED MCG/HR) 5 MCG/HR PTWK patch, Place 5 mcg onto the skin once a week., Disp: , Rfl:  .  chlorpheniramine (CHLOR-TRIMETON) 4 MG tablet, Take 2 tablets by mouth at bedtime, Disp: , Rfl:  .  cholecalciferol (VITAMIN D) 1000 UNITS tablet, Take 1,000 Units by mouth every morning., Disp: , Rfl:  .  clonazePAM (KLONOPIN) 0.5 MG tablet, TAKE 1 TABLET BY MOUTH TWICE DAILY AS NEEDED FOR ANXIETY OR SLEEP, Disp: 60 tablet, Rfl: 2 .  cloNIDine (CATAPRES) 0.2 MG tablet, Take 1 tablet (0.2 mg total) by mouth 3 (three) times daily. NEED OV., Disp: 90 tablet, Rfl: 0 .  clotrimazole-betamethasone (LOTRISONE) cream, Apply to affected area 2 times daily prn, Disp: 45 g, Rfl: 0 .  dextromethorphan-guaiFENesin (MUCINEX DM) 30-600 MG 12hr tablet, Take 1 tablet by mouth 2 (two) times daily as  needed (with flutter valve for cough and congestion). Reported on 04/01/2016, Disp: , Rfl:  .  ferrous sulfate 325 (65 FE) MG tablet, Take 1 tablet (325 mg total) by mouth daily with breakfast., Disp: 30 tablet, Rfl: 3 .  fluticasone (CUTIVATE) 0.05 % cream, Apply topically 2 (two) times daily., Disp: 30 g, Rfl: 0 .  FOLIC ACID PO, Take 1 tablet by mouth every morning., Disp: , Rfl:  .  furosemide (LASIX) 40 MG tablet, Take 1 tablet (40 mg total) by mouth every morning., Disp: 30 tablet, Rfl: 2 .  gabapentin (NEURONTIN) 400 MG capsule, Take 2 capsules (800 mg total) by mouth 3 (three) times daily., Disp: 180 capsule, Rfl: 5 .  glucose blood (RELION GLUCOSE TEST STRIPS) test strip, 1 each by Other route 4 (four) times daily. ICD code E11.65, Disp: 400 each, Rfl: 3 .  Insulin Pen Needle (B-D ULTRAFINE III SHORT PEN) 31G X 8 MM MISC, 1 application by Does not apply route daily., Disp: 100 each, Rfl: 3 .  Insulin Regular Human (HUMULIN R U-500, CONCENTRATED, [place]), Inject into the skin. 160U in morning, 120Uafternoon, 120U evening, Disp: , Rfl:  .  Insulin Syringe-Needle U-100 (BD INSULIN SYRINGE ULTRAFINE) 31G X 15/64" 1 ML MISC, 1 each by Does not apply route 3 (three) times daily., Disp: 300 each, Rfl: 3 .  Lancet Devices (ACCU-CHEK SOFTCLIX) lancets, Use as instructed, Disp: 1 each, Rfl: 0 .  lidocaine (LIDODERM) 5 %, Place 1 patch onto the skin daily. Remove & Discard patch within 12 hours or as directed by MD, Disp: 30 patch, Rfl: 0 .  losartan (COZAAR) 100 MG tablet, Take 1 tablet (100 mg total) by mouth every morning., Disp: 30 tablet, Rfl: 3 .  metFORMIN (GLUCOPHAGE) 1000 MG tablet, Take 1 tablet (1,000 mg total) by mouth 2 (two) times daily with a meal., Disp: 60 tablet, Rfl: 11 .  methocarbamol (ROBAXIN) 750 MG tablet, Take 1 tablet (750 mg total) by mouth every 8 (eight) hours as needed for muscle spasms., Disp: 90 tablet, Rfl: 11 .  methylphenidate (RITALIN LA) 10 MG 24 hr capsule, Take 10 mg by  mouth daily., Disp: , Rfl:  .  metoCLOPramide (REGLAN) 10 MG tablet, Take 1 tablet (10 mg total) by mouth every 6 (six) hours., Disp: 30 tablet, Rfl: 3 .  mometasone-formoterol (DULERA) 100-5 MCG/ACT AERO, Take 2 puffs first thing in am and then another 2 puffs about 12 hours later., Disp: 1 Inhaler, Rfl: 11 .  montelukast (SINGULAIR) 10 MG tablet,  Take 1 tablet (10 mg total) by mouth at bedtime., Disp: 30 tablet, Rfl: 2 .  omeprazole (PRILOSEC) 20 MG capsule, Take 1 capsule (20 mg total) by mouth daily before breakfast., Disp: 30 capsule, Rfl: 11 .  Oyster Shell Calcium 500 MG TABS, Take 1 tablet (1,250 mg total) by mouth daily., Disp: 90 tablet, Rfl: 0 .  PARoxetine (PAXIL) 20 MG tablet, Take 1 tablet (20 mg total) by mouth daily., Disp: 30 tablet, Rfl: 3 .  predniSONE (DELTASONE) 1 MG tablet, Take 4 tablets (4 mg total) by mouth daily with breakfast., Disp: 120 tablet, Rfl: 0 .  ranitidine (ZANTAC) 300 MG tablet, Take 1 tablet (300 mg total) by mouth at bedtime., Disp: 30 tablet, Rfl: 5 .  ReliOn Ultra Thin Lancets MISC, 1 each by Does not apply route 4 (four) times daily. ICD code E11.65, Disp: 400 each, Rfl: 3 .  Respiratory Therapy Supplies (FLUTTER) DEVI, Use as directed, Disp: 1 each, Rfl: 0 .  ULTICARE MICRO PEN NEEDLES 32G X 4 MM MISC, USE AS DIRECTED, Disp: 100 each, Rfl: 2 .  verapamil (VERELAN PM) 360 MG 24 hr capsule, TAKE ONE CAPSULE BY MOUTH EVERY MORNING, Disp: 30 capsule, Rfl: 2 No current facility-administered medications for this visit.   Facility-Administered Medications Ordered in Other Visits:  .  DOBUTamine (DOBUTREX) 1,000 mcg/mL in dextrose 5% 250 mL infusion, 30 mcg/kg/min, Intravenous, Continuous, Mihai Croitoru, MD, Last Rate: 145.3 mL/hr at 04/22/15 1300, 30 mcg/kg/min at 04/22/15 1300

## 2016-07-04 NOTE — Assessment & Plan Note (Signed)
She has some upper airway wheeze, I suspect this is primarily related to postnasal drip. She saw ear nose and throat he said that she does not have vocal cord dysfunction. I do not suspect that her wheeze is coming from asthma.  Plan: Use Mucinex twice a day Drink plenty of liquids Voice rest

## 2016-07-04 NOTE — Assessment & Plan Note (Signed)
We will continue to try to slowly taper down prednisone. She will continue at 4 mg daily as of today, but we will continue to make effort to reduce this.

## 2016-07-04 NOTE — Patient Instructions (Signed)
Use your Dulera with a spacer We will arrange for a home sleep study Use albuterol on an as-needed basis for shortness of breath, chest tightness Keep taking prednisone as you are doing We will see you back in 4 months or sooner if needed

## 2016-07-04 NOTE — Assessment & Plan Note (Signed)
She continues to describe symptoms of this problem. However, her sleep study did not show evidence of sleep apnea. She insists that she snores, and that her sister has witnessed her not breathe. She says that she did not sleep very well during the sleep study at [facility]. She also tells me that she sawmy partner in passing when she came for a visit with her sister recently and he suggested that she have a home sleep study.   Plan: Home sleep study

## 2016-07-05 LAB — NITRIC OXIDE: Nitric Oxide: 16

## 2016-07-05 MED ORDER — AEROCHAMBER MV MISC: 0 refills | Status: DC

## 2016-07-05 NOTE — Addendum Note (Signed)
Addended by: Collier Salina on: 07/05/2016 05:45 PM   Modules accepted: Orders

## 2016-07-05 NOTE — Addendum Note (Signed)
Addended by: Collier Salina on: 07/05/2016 05:38 PM   Modules accepted: Orders

## 2016-07-10 ENCOUNTER — Other Ambulatory Visit: Payer: Self-pay | Admitting: Family Medicine

## 2016-07-13 ENCOUNTER — Other Ambulatory Visit: Payer: Self-pay | Admitting: Family Medicine

## 2016-07-13 DIAGNOSIS — J45998 Other asthma: Secondary | ICD-10-CM

## 2016-07-15 ENCOUNTER — Other Ambulatory Visit: Payer: Self-pay | Admitting: Family Medicine

## 2016-07-18 ENCOUNTER — Ambulatory Visit: Payer: Medicaid Other | Attending: Family Medicine | Admitting: Family Medicine

## 2016-07-18 ENCOUNTER — Encounter: Payer: Self-pay | Admitting: Family Medicine

## 2016-07-18 ENCOUNTER — Other Ambulatory Visit: Payer: Self-pay | Admitting: Family Medicine

## 2016-07-18 VITALS — BP 117/75 | HR 80 | Temp 98.2°F | Ht <= 58 in | Wt 192.8 lb

## 2016-07-18 DIAGNOSIS — Z794 Long term (current) use of insulin: Secondary | ICD-10-CM | POA: Diagnosis not present

## 2016-07-18 DIAGNOSIS — L089 Local infection of the skin and subcutaneous tissue, unspecified: Secondary | ICD-10-CM | POA: Diagnosis not present

## 2016-07-18 DIAGNOSIS — M4856XA Collapsed vertebra, not elsewhere classified, lumbar region, initial encounter for fracture: Secondary | ICD-10-CM | POA: Diagnosis not present

## 2016-07-18 DIAGNOSIS — Q808 Other congenital ichthyosis: Secondary | ICD-10-CM | POA: Diagnosis not present

## 2016-07-18 DIAGNOSIS — S32010S Wedge compression fracture of first lumbar vertebra, sequela: Secondary | ICD-10-CM

## 2016-07-18 DIAGNOSIS — Z7982 Long term (current) use of aspirin: Secondary | ICD-10-CM | POA: Insufficient documentation

## 2016-07-18 DIAGNOSIS — M79674 Pain in right toe(s): Secondary | ICD-10-CM | POA: Insufficient documentation

## 2016-07-18 DIAGNOSIS — K219 Gastro-esophageal reflux disease without esophagitis: Secondary | ICD-10-CM | POA: Insufficient documentation

## 2016-07-18 DIAGNOSIS — L989 Disorder of the skin and subcutaneous tissue, unspecified: Secondary | ICD-10-CM | POA: Diagnosis not present

## 2016-07-18 DIAGNOSIS — K760 Fatty (change of) liver, not elsewhere classified: Secondary | ICD-10-CM | POA: Insufficient documentation

## 2016-07-18 DIAGNOSIS — E119 Type 2 diabetes mellitus without complications: Secondary | ICD-10-CM | POA: Insufficient documentation

## 2016-07-18 DIAGNOSIS — E1165 Type 2 diabetes mellitus with hyperglycemia: Secondary | ICD-10-CM | POA: Diagnosis not present

## 2016-07-18 DIAGNOSIS — E249 Cushing's syndrome, unspecified: Secondary | ICD-10-CM | POA: Insufficient documentation

## 2016-07-18 DIAGNOSIS — IMO0001 Reserved for inherently not codable concepts without codable children: Secondary | ICD-10-CM

## 2016-07-18 DIAGNOSIS — I10 Essential (primary) hypertension: Secondary | ICD-10-CM | POA: Insufficient documentation

## 2016-07-18 DIAGNOSIS — L03311 Cellulitis of abdominal wall: Secondary | ICD-10-CM

## 2016-07-18 DIAGNOSIS — T380X5A Adverse effect of glucocorticoids and synthetic analogues, initial encounter: Secondary | ICD-10-CM | POA: Diagnosis not present

## 2016-07-18 DIAGNOSIS — R234 Changes in skin texture: Secondary | ICD-10-CM

## 2016-07-18 DIAGNOSIS — M810 Age-related osteoporosis without current pathological fracture: Secondary | ICD-10-CM | POA: Diagnosis not present

## 2016-07-18 LAB — GLUCOSE, POCT (MANUAL RESULT ENTRY): POC Glucose: 117 mg/dl — AB (ref 70–99)

## 2016-07-18 LAB — POCT GLYCOSYLATED HEMOGLOBIN (HGB A1C): Hemoglobin A1C: 7.7

## 2016-07-18 MED ORDER — BETAMETHASONE VALERATE 0.12 % EX FOAM: 1.0000 "application " | Freq: Every day | CUTANEOUS | 0 refills | Status: DC

## 2016-07-18 MED ORDER — MUPIROCIN 2 % EX OINT: 1.0000 "application " | TOPICAL_OINTMENT | Freq: Two times a day (BID) | CUTANEOUS | 0 refills | Status: DC

## 2016-07-18 MED ORDER — OYSTER SHELL CALCIUM 500 MG PO TABS: 500.0000 mg | ORAL_TABLET | Freq: Every day | ORAL | 3 refills | Status: DC

## 2016-07-18 MED ORDER — CANE MISC: 1.0000 | Freq: Every day | 0 refills | Status: AC

## 2016-07-18 MED FILL — OYSTER SHELL CAL 500 MG TAB: 500 | 30 days supply | Qty: 30 | Fill #0

## 2016-07-18 NOTE — Assessment & Plan Note (Signed)
Controlled- continue current regimen.  

## 2016-07-18 NOTE — Patient Instructions (Addendum)
Raven Turner was seen today for diabetes and hypertension.  Diagnoses and all orders for this visit:  Uncontrolled type 2 diabetes mellitus without complication, with long-term current use of insulin (HCC) -     POCT glucose (manual entry) -     Ambulatory referral to Ophthalmology -     POCT glycosylated hemoglobin (Hb A1C)  Compression fracture of L1 lumbar vertebra, sequela -     Oyster Shell Calcium 500 MG TABS; Take 1 tablet (1,250 mg total) by mouth daily. -     Misc. Devices (CANE) MISC; 1 each by Does not apply route daily.  Acral type peeling skin syndrome -     Ambulatory referral to Dermatology  Flaking of scalp -     Ambulatory referral to Dermatology -     Betamethasone Valerate 0.12 % foam; Apply 1 application topically daily. To scalp  I do recommend toenail removal one at a time if painful.   F/u in 3 months for HTN and diabetes  Dr. Adrian Blackwater

## 2016-07-18 NOTE — Assessment & Plan Note (Signed)
Flaking of scalp and peeling of skin around nail bed ? Psoriasis  Plan: Derm referral Topical steroid foam

## 2016-07-18 NOTE — Progress Notes (Signed)
Subjective:  Patient ID: Raven Turner, female    DOB: 1970/09/18  Age: 45 y.o. MRN: [id number]  CC: Diabetes and Hypertension   HPI Makella Buckingham has steroid induced osteoporosis and Cushing syndrome, morbid obesity, fatty liver,  GERD, HTN, DM, Thoracic and lumbar compression fractures presents for    1. CHRONIC DIABETES  Disease Monitoring  Blood Sugar Ranges:   Fasting 105-150  Postprandial 221 -300, followed by Endocrinology   Polyuria: no   Visual problems: no   Medication Compliance: yes  Medication Side Effects  Hypoglycemia: no   Preventitive Health Care  Eye Exam: due   Foot Exam: done recently    2. HTN: compliant with regimen. No swelling. No HA, CP or SOB.   3.  Peeling along fingernails: for 2 weeks. Associated with pain no swelling.   4. Painful toes: has pain in 2nd toe on R foot and first toe on L foot. Has been recommended by podiatry to have nails removed. She is unsure if she should and request recommendations from me.   5. Skin infection: on R mid abdomen. Discharge of clear fluid. Has hx of superficial cellulitis.  6. Lots of dandruff: using T-gel. Has a lot of flaking and peeling in scalp. She wonders if she has psoriasis.    Social History  Substance Use Topics  . Smoking status: Never Smoker  . Smokeless tobacco: Never Used  . Alcohol use No   Outpatient Medications Prior to Visit  Medication Sig Dispense Refill  . acetaminophen-codeine (TYLENOL #3) 300-30 MG tablet TAKE 1 TO 2 TABLETS BY MOUTH AT BEDTIME AS NEEDED FOR MODERATE PAIN 60 tablet 2  . albuterol (PROVENTIL HFA;VENTOLIN HFA) 108 (90 Base) MCG/ACT inhaler Inhale 2 puffs into the lungs every 4 (four) hours as needed for wheezing or shortness of breath (((PLAN B))).    Marland Kitchen albuterol (PROVENTIL) (2.5 MG/3ML) 0.083% nebulizer solution USE 1 VIAL VIA NEBULIZER EVERY 4 HOURS AS NEEDED WHEEZING OR SHORTNESS OF BREATH 90 mL 3  . aspirin 325 MG tablet Take 325 mg by mouth  every morning.    Marland Kitchen atorvastatin (LIPITOR) 20 MG tablet TAKE 1 TABLET BY MOUTH EVERY DAY 90 tablet 0  . Blood Glucose Monitoring Suppl (ACCU-CHEK AVIVA PLUS) W/DEVICE KIT Used as directed 1 kit 0  . buprenorphine (BUTRANS - DOSED MCG/HR) 5 MCG/HR PTWK patch Place 5 mcg onto the skin once a week.    . chlorpheniramine (CHLOR-TRIMETON) 4 MG tablet Take 2 tablets by mouth at bedtime    . cholecalciferol (VITAMIN D) 1000 UNITS tablet Take 1,000 Units by mouth every morning.    . clonazePAM (KLONOPIN) 0.5 MG tablet TAKE 1 TABLET BY MOUTH TWICE DAILY AS NEEDED FOR ANXIETY OR SLEEP 60 tablet 2  . cloNIDine (CATAPRES) 0.2 MG tablet Take 1 tablet (0.2 mg total) by mouth 3 (three) times daily. NEED OV. 90 tablet 0  . clotrimazole-betamethasone (LOTRISONE) cream Apply to affected area 2 times daily prn 45 g 0  . dextromethorphan-guaiFENesin (MUCINEX DM) 30-600 MG 12hr tablet Take 1 tablet by mouth 2 (two) times daily as needed (with flutter valve for cough and congestion). Reported on 04/01/2016    . ferrous sulfate 325 (65 FE) MG tablet Take 1 tablet (325 mg total) by mouth daily with breakfast. 30 tablet 3  . fluticasone (CUTIVATE) 0.05 % cream Apply topically 2 (two) times daily. 30 g 0  . FOLIC ACID PO Take 1 tablet by mouth every morning.    Marland Kitchen  furosemide (LASIX) 40 MG tablet Take 1 tablet (40 mg total) by mouth every morning. 30 tablet 2  . gabapentin (NEURONTIN) 400 MG capsule Take 2 capsules (800 mg total) by mouth 3 (three) times daily. 180 capsule 5  . glucose blood (RELION GLUCOSE TEST STRIPS) test strip 1 each by Other route 4 (four) times daily. ICD code E11.65 400 each 3  . HUMULIN R U-500 KWIKPEN 500 UNIT/ML kwikpen Inject 100-160 Units into the skin 3 (three) times daily. 160 U before breakfast, 140 U before lunch, 100 U before dinner  11  . Insulin Pen Needle (B-D ULTRAFINE III SHORT PEN) 31G X 8 MM MISC 1 application by Does not apply route daily. 100 each 3  . Insulin Syringe-Needle U-100 (BD  INSULIN SYRINGE ULTRAFINE) 31G X 15/64" 1 ML MISC 1 each by Does not apply route 3 (three) times daily. 300 each 3  . Lancet Devices (ACCU-CHEK SOFTCLIX) lancets Use as instructed 1 each 0  . lidocaine (LIDODERM) 5 % Place 1 patch onto the skin daily. Remove & Discard patch within 12 hours or as directed by MD 30 patch 0  . losartan (COZAAR) 100 MG tablet Take 1 tablet (100 mg total) by mouth every morning. 30 tablet 3  . metFORMIN (GLUCOPHAGE) 1000 MG tablet Take 1 tablet (1,000 mg total) by mouth 2 (two) times daily with a meal. 60 tablet 11  . methocarbamol (ROBAXIN) 750 MG tablet Take 1 tablet (750 mg total) by mouth every 8 (eight) hours as needed for muscle spasms. 90 tablet 11  . methylphenidate (RITALIN LA) 10 MG 24 hr capsule Take 10 mg by mouth daily.    . metoCLOPramide (REGLAN) 10 MG tablet Take 1 tablet (10 mg total) by mouth every 6 (six) hours. 30 tablet 3  . mometasone-formoterol (DULERA) 100-5 MCG/ACT AERO Take 2 puffs first thing in am and then another 2 puffs about 12 hours later. 1 Inhaler 11  . montelukast (SINGULAIR) 10 MG tablet TAKE 1 TABLET BY MOUTH AT BEDTIME 30 tablet 3  . omeprazole (PRILOSEC) 20 MG capsule Take 1 capsule (20 mg total) by mouth daily before breakfast. 30 capsule 11  . Oyster Shell Calcium 500 MG TABS Take 1 tablet (1,250 mg total) by mouth daily. 90 tablet 0  . PARoxetine (PAXIL) 20 MG tablet Take 1 tablet (20 mg total) by mouth daily. 30 tablet 3  . predniSONE (DELTASONE) 1 MG tablet Take 4 tablets (4 mg total) by mouth daily with breakfast. 120 tablet 0  . ranitidine (ZANTAC) 300 MG tablet TAKE ONE TABLET BY MOUTH AT BEDTIME 30 tablet 3  . ReliOn Ultra Thin Lancets MISC 1 each by Does not apply route 4 (four) times daily. ICD code E11.65 400 each 3  . Respiratory Therapy Supplies (FLUTTER) DEVI Use as directed 1 each 0  . Spacer/Aero-Holding Chambers (AEROCHAMBER MV) inhaler Use as instructed 1 each 0  . ULTICARE MICRO PEN NEEDLES 32G X 4 MM MISC USE  AS DIRECTED 100 each 2  . verapamil (VERELAN PM) 360 MG 24 hr capsule TAKE ONE CAPSULE BY MOUTH EVERY MORNING 30 capsule 3   Facility-Administered Medications Prior to Visit  Medication Dose Route Frequency Provider Last Rate Last Dose  . DOBUTamine (DOBUTREX) 1,000 mcg/mL in dextrose 5% 250 mL infusion  30 mcg/kg/min Intravenous Continuous Mihai Croitoru, MD 145.3 mL/hr at 04/22/15 1300 30 mcg/kg/min at 04/22/15 1300    ROS Review of Systems  Constitutional: Negative for chills and fever.  HENT: Negative  for congestion.   Eyes: Positive for visual disturbance.  Respiratory: Negative for cough and shortness of breath.   Cardiovascular: Negative for chest pain.  Gastrointestinal: Negative for abdominal distention, abdominal pain, anal bleeding, blood in stool, constipation, diarrhea, nausea, rectal pain and vomiting.  Musculoskeletal: Positive for arthralgias, back pain and myalgias.  Skin: Positive for rash. Negative for wound.  Allergic/Immunologic: Negative for immunocompromised state.  Hematological: Negative for adenopathy. Does not bruise/bleed easily.  Psychiatric/Behavioral: Negative for dysphoric mood and suicidal ideas.    Objective:  BP 117/75 (BP Location: Right Arm, Patient Position: Sitting, Cuff Size: Large)   Pulse 80   Temp 98.2 F (36.8 C) (Oral)   Ht '4\' 10"'$  (1.473 m)   Wt 192 lb 12.8 oz (87.5 kg)   LMP 07/11/2016   SpO2 97%   BMI 40.30 kg/m   BP/Weight 07/18/2016 07/04/2016 41/58/3094  Systolic BP 076 808 811  Diastolic BP 75 84 76  Wt. (Lbs) 192.8 192.4 -  BMI 40.3 40.21 -    Physical Exam  Constitutional: She is oriented to person, place, and time. She appears well-developed and well-nourished. No distress.  Obese   HENT:  Head: Normocephalic and atraumatic.  Cardiovascular: Normal rate, regular rhythm, normal heart sounds and intact distal pulses.   Pulmonary/Chest: Effort normal and breath sounds normal.  Musculoskeletal: She exhibits no edema.        Feet:  Neurological: She is alert and oriented to person, place, and time.  Skin: Skin is warm and dry. No rash noted.  Psychiatric: She has a normal mood and affect.   Lab Results  Component Value Date   HGBA1C 8.0 04/25/2016   Lab Results  Component Value Date   HGBA1C 7.7 07/18/2016    CBG 117 Assessment & Plan:   Sharee was seen today for diabetes and hypertension.  Diagnoses and all orders for this visit:  Uncontrolled type 2 diabetes mellitus without complication, with long-term current use of insulin (HCC) -     POCT glucose (manual entry) -     Ambulatory referral to Ophthalmology -     POCT glycosylated hemoglobin (Hb A1C)  Compression fracture of L1 lumbar vertebra, sequela -     Oyster Shell Calcium 500 MG TABS; Take 1 tablet (1,250 mg total) by mouth daily. -     Misc. Devices (CANE) MISC; 1 each by Does not apply route daily.  Acral type peeling skin syndrome -     Ambulatory referral to Dermatology  Flaking of scalp -     Ambulatory referral to Dermatology -     Betamethasone Valerate 0.12 % foam; Apply 1 application topically daily. To scalp    No orders of the defined types were placed in this encounter.   Follow-up: Return in about 3 months (around 10/18/2016) for DM2 and HTN .   Boykin Nearing MD

## 2016-07-18 NOTE — Progress Notes (Signed)
Pt is here today to follow up on HTN, and Diabetes.

## 2016-07-18 NOTE — Assessment & Plan Note (Signed)
Improved control of diabetes Continue current regimen

## 2016-07-25 ENCOUNTER — Encounter: Payer: Self-pay | Admitting: Pulmonary Disease

## 2016-07-25 ENCOUNTER — Ambulatory Visit (INDEPENDENT_AMBULATORY_CARE_PROVIDER_SITE_OTHER): Payer: Medicaid Other | Admitting: Pulmonary Disease

## 2016-07-25 ENCOUNTER — Other Ambulatory Visit: Payer: Self-pay | Admitting: Internal Medicine

## 2016-07-25 VITALS — BP 122/76 | HR 76 | Ht <= 58 in | Wt 192.8 lb

## 2016-07-25 DIAGNOSIS — G4733 Obstructive sleep apnea (adult) (pediatric): Secondary | ICD-10-CM

## 2016-07-25 NOTE — Patient Instructions (Signed)
Sleep study will be repeated

## 2016-07-25 NOTE — Assessment & Plan Note (Signed)
Pretest probability is high, surprisingly her sleep study in 2016 did not show any evidence of sleep-disordered breathing. This could be due to lack of supine sleep or due to absence of REM sleep. She has gained weight since then and continues to snore with her sister having witnessed apneas. I think we should  repeat her polysomnogram.

## 2016-07-25 NOTE — Progress Notes (Signed)
   Subjective:    Patient ID: Raven Turner, female    DOB: 04-Apr-1971, 45 y.o.   MRN: [id number]  HPI  45 year old [place] speaking woman presents for FU of sleep-disordered breathing She has a history of lumbar spine compression fracture, and wears a hard back brace for most of the daytime. She has asthma since childhood, now steroid-dependent for the past 7 years. She is maintained on a regimen of Dulera and Singulair. She takes clonazepam for anxiety, for her pain she is on Neurontin and Tylenol/codeine.   07/25/2016  Chief Complaint  Patient presents with  . Follow-up    Pt. has trouble falling asleep, daytime somnolence, Pt. states she is taking her sleep medication every night, discuss being on CPAP, says insurance would not cover HST   She remains on low-dose prednisone Epworth sleepiness score is 16 and she reports excessive daytime somnolence and fatigue in various social situations.   Her sister is overweight, has OSA and uses a CPAP machine. Her sister has witnessed apneas. She has also woken herself up by her own snoring   Bedtime is around 10 PM, she uses her clonazepam and an albuterol neb close to bedtime. Sleep latency is about an hour, she sleeps on her side with one pillow, reports 2 nocturnal awakenings due to pain, denies nocturia, and is out of bed by 8:30 AM with dryness of mouth and complaining of chronic pain. She naps about 1-2 hours and does so for recliner while watching TV and feels rested afterwards   Review of Systems Patient denies significant dyspnea,cough, hemoptysis,  chest pain, palpitations, pedal edema, orthopnea, paroxysmal nocturnal dyspnea, lightheadedness, nausea, vomiting, abdominal or  leg pains      Objective:   Physical Exam  Gen. Pleasant, obese, in no distress ENT - class 2 airway, no post nasal drip Neck: No JVD, no thyromegaly, no carotid bruits Lungs: no use of accessory muscles, no dullness to percussion,  decreased without rales or rhonchi  Cardiovascular: Rhythm regular, heart sounds  normal, no murmurs or gallops, no peripheral edema Musculoskeletal: No deformities, no cyanosis or clubbing , no tremors       Assessment & Plan:

## 2016-07-26 ENCOUNTER — Telehealth: Payer: Self-pay | Admitting: Family Medicine

## 2016-07-26 NOTE — Telephone Encounter (Signed)
I cannot refill this - will forward to Dr. Adrian Blackwater.

## 2016-07-26 NOTE — Telephone Encounter (Signed)
Patient called the office to request medication refill for clonazePAM (KLONOPIN) 0.5 MG tablet.  Thank you.

## 2016-07-26 NOTE — Telephone Encounter (Signed)
Clonazepam refilled last month with 2 refills, 90 day supply Next refill will be done in January  Sent mychart message to patient

## 2016-07-28 ENCOUNTER — Other Ambulatory Visit: Payer: Self-pay | Admitting: Family Medicine

## 2016-07-28 DIAGNOSIS — F411 Generalized anxiety disorder: Secondary | ICD-10-CM

## 2016-08-01 LAB — HM DIABETES EYE EXAM

## 2016-08-24 ENCOUNTER — Other Ambulatory Visit: Payer: Self-pay | Admitting: Family Medicine

## 2016-09-15 ENCOUNTER — Ambulatory Visit (HOSPITAL_BASED_OUTPATIENT_CLINIC_OR_DEPARTMENT_OTHER): Payer: Medicaid Other | Attending: Pulmonary Disease | Admitting: Pulmonary Disease

## 2016-09-15 DIAGNOSIS — Z79899 Other long term (current) drug therapy: Secondary | ICD-10-CM | POA: Diagnosis not present

## 2016-09-15 DIAGNOSIS — G4733 Obstructive sleep apnea (adult) (pediatric): Secondary | ICD-10-CM | POA: Diagnosis present

## 2016-09-15 DIAGNOSIS — R0683 Snoring: Secondary | ICD-10-CM | POA: Insufficient documentation

## 2016-09-19 ENCOUNTER — Telehealth: Payer: Self-pay | Admitting: Pulmonary Disease

## 2016-09-19 DIAGNOSIS — R0683 Snoring: Secondary | ICD-10-CM

## 2016-09-19 NOTE — Telephone Encounter (Signed)
Sleep study again did not show any evidence of obstructive sleep apnea She did have loud snoring Weight loss advised and can recommend oral appliance and referral to dentist if snoring has to be corrected

## 2016-09-19 NOTE — Procedures (Signed)
Patient Name: Mc, Hollen Study Date: 09/15/2016 Gender: Female D.O.B: 09-28-1970 Age (years): 37 Referring Provider: Kara Mead MD, ABSM Height (inches): 58 Interpreting Physician: Kara Mead MD, ABSM Weight (lbs): 192 RPSGT: Zadie Rhine BMI: 40 MRN: [id number] Neck Size: 16.50 CLINICAL INFORMATION Sleep Study Type: NPSG  Indication for sleep study: OSA  Epworth Sleepiness Score: 10   Most recent polysomnogram dated 06/30/2015 revealed an AHI of 0.2/h and RDI of 0.9/h. Most recent titration study was on 06/30/2015.  SLEEP STUDY TECHNIQUE As per the AASM Manual for the Scoring of Sleep and Associated Events v2.3 (April 2016) with a hypopnea requiring 4% desaturations.  The channels recorded and monitored were frontal, central and occipital EEG, electrooculogram (EOG), submentalis EMG (chin), nasal and oral airflow, thoracic and abdominal wall motion, anterior tibialis EMG, snore microphone, electrocardiogram, and pulse oximetry.  MEDICATIONS Medications self-administered by patient taken the night of the study : SINGULAIR, ZANTAC, CLONAZEPAM, CLONIDINE, GABAPENTIN, ROBAXIN-750, REGLAN  SLEEP ARCHITECTURE The study was initiated at 10:39:26 PM and ended at 4:38:49 AM.  Sleep onset time was 50.3 minutes and the sleep efficiency was 81.8%. The total sleep time was 294.0 minutes.  Stage REM latency was N/A minutes.  The patient spent 5.78% of the night in stage N1 sleep, 94.22% in stage N2 sleep, 0.00% in stage N3 and 0.00% in REM.  Alpha intrusion was absent.  Supine sleep was 54.85%.  RESPIRATORY PARAMETERS The overall apnea/hypopnea index (AHI) was 0.0 per hour. There were 0 total apneas, including 0 obstructive, 0 central and 0 mixed apneas. There were 0 hypopneas and 8 RERAs.  The AHI during Stage REM sleep was N/A per hour.  AHI while supine was 0.0 per hour.  The mean oxygen saturation was 94.71%. The minimum SpO2 during sleep was 91.00%.  Loud  snoring was noted during this study.  CARDIAC DATA The 2 lead EKG demonstrated sinus rhythm. The mean heart rate was 65.14 beats per minute. Other EKG findings include: None.  LEG MOVEMENT DATA The total PLMS were 0 with a resulting PLMS index of 0.00. Associated arousal with leg movement index was 0.0 .  IMPRESSIONS - No significant obstructive sleep apnea occurred during this study (AHI = 0.0/h). - No significant central sleep apnea occurred during this study (CAI = 0.0/h). - The patient had minimal or no oxygen desaturation during the study (Min O2 = 91.00%) - The patient snored with Loud snoring volume. - No cardiac abnormalities were noted during this study. - Clinically significant periodic limb movements did not occur during sleep. No significant associated arousals.   DIAGNOSIS - Snoring    RECOMMENDATIONS - Avoid alcohol, sedatives and other CNS depressants that may worsen sleep apnea and disrupt normal sleep architecture. - Sleep hygiene should be reviewed to assess factors that may improve sleep quality. - Weight management and regular exercise should be initiated or continued if appropriate.   Kara Mead MD Board Certified in [place]

## 2016-09-21 NOTE — Telephone Encounter (Signed)
[phone] pt calling back

## 2016-09-21 NOTE — Telephone Encounter (Signed)
Spoke with the pt  She could not understand due to language barrier I advised her to have her spouse call back, she states he speaks better english  Will await his call

## 2016-09-21 NOTE — Telephone Encounter (Signed)
lmtcb X1 

## 2016-09-21 NOTE — Telephone Encounter (Signed)
Spoke with patient husband - aware of results per Dr Elsworth Soho. Refused the referral for the oral appliance. Will send to Dr Elsworth Soho as Juluis Rainier.

## 2016-09-22 ENCOUNTER — Telehealth: Payer: Self-pay | Admitting: Pulmonary Disease

## 2016-09-22 DIAGNOSIS — R0683 Snoring: Secondary | ICD-10-CM

## 2016-09-22 NOTE — Telephone Encounter (Signed)
Spoke with pt's husband who states, pt did agree with the referral to ortho for oral appliance. Referral has been placed. Pt's spouse aware and voiced his understanding. Nothing further needed.

## 2016-09-22 NOTE — Telephone Encounter (Signed)
LM x 1 

## 2016-09-27 ENCOUNTER — Other Ambulatory Visit: Payer: Self-pay | Admitting: Family Medicine

## 2016-10-06 ENCOUNTER — Telehealth: Payer: Self-pay | Admitting: Family Medicine

## 2016-10-06 ENCOUNTER — Encounter: Payer: Self-pay | Admitting: Family Medicine

## 2016-10-06 DIAGNOSIS — I10 Essential (primary) hypertension: Secondary | ICD-10-CM

## 2016-10-06 MED ORDER — VERAPAMIL HCL ER 240 MG PO TBCR: 240.0000 mg | EXTENDED_RELEASE_TABLET | Freq: Every day | ORAL | 5 refills | Status: DC

## 2016-10-06 NOTE — Telephone Encounter (Signed)
Please inform patient.  Raven Turner,  that verapamil 360 mg SR is no longer covered by medicaid.  Since your blood pressure  (BP) and hear rated (HR) have been doing so well, I would like to have you try the same med at the reduced dose of 240 mg.  If HR or BP goes up, we can change to the 120 mg dose x 3 tabs to equal 360 mg.   Dr. Adrian Blackwater

## 2016-10-08 ENCOUNTER — Other Ambulatory Visit: Payer: Self-pay | Admitting: Family Medicine

## 2016-10-23 ENCOUNTER — Ambulatory Visit (HOSPITAL_COMMUNITY)
Admission: EM | Admit: 2016-10-23 | Discharge: 2016-10-23 | Disposition: A | Discharge status: Home / Self Care | Payer: Medicaid Other | Attending: Internal Medicine | Admitting: Internal Medicine

## 2016-10-23 ENCOUNTER — Encounter (HOSPITAL_COMMUNITY): Payer: Self-pay | Admitting: Emergency Medicine

## 2016-10-23 DIAGNOSIS — B351 Tinea unguium: Secondary | ICD-10-CM

## 2016-10-23 DIAGNOSIS — L03031 Cellulitis of right toe: Secondary | ICD-10-CM

## 2016-10-23 MED ORDER — CEPHALEXIN 500 MG PO CAPS
500.0000 mg | ORAL_CAPSULE | Freq: Four times a day (QID) | ORAL | 0 refills | Status: DC
Start: 2016-10-23 — End: 2016-11-07

## 2016-10-23 NOTE — Discharge Instructions (Signed)
You have a toenail and infection with fungus. Surrounding this area the soft tissue is turning a little red and swollen suggesting a early bacterial type of infection. We will treat with an antibiotic and soak the toe and warm salt water a couple times a day. Call your podiatrist tomorrow for follow-up appointment.

## 2016-10-23 NOTE — ED Triage Notes (Signed)
The patient presented to the [facility] with a complaint of pain and drainage to the second toe on her right foot that started yesterday.

## 2016-10-23 NOTE — ED Provider Notes (Signed)
CSN: [id number]     Arrival date & time 10/23/16  1402 History   First MD Initiated Contact with Patient 10/23/16 1622     Chief Complaint  Patient presents with  . Toe Pain   (Consider location/radiation/quality/duration/timing/severity/associated sxs/prior Treatment) 46 year old female with discomfort and drainage to the right second toe. She is a diabetic with metabolic syndrome, Cushing syndrome, COPD, diastolic heart failure and several other medical conditions.      Past Medical History:  Diagnosis Date  . Anemia   . Asthma   . Bilateral hand numbness 06/25/2015  . Bone pain 02/27/2015   Diffuse bone pain in legs mostly but also in arms    . Breast pain, left 03/27/2015  . Callus of heel 12/16/2014  . Compression fracture   . Compression fracture of L1 lumbar vertebra (HCC) 08/26/2014  . Compression fracture of T12 vertebra ([place]) 08/28/2014  . COPD (chronic obstructive pulmonary disease) ([place]) 2013  . Cushing syndrome ([place]) 2013   . Depression   . Diabetes ([place]) 2013   . Diabetes mellitus type 2, uncontrolled ([place]) 08/26/2014   Sees Dr. Katy Fitch.  Last visit 11/27/14.  No diabetic retinopathy    . Diabetes mellitus with neuropathy ([place])   . Diastolic dysfunction without heart failure 10/14/2014   Grade 2 diastolic dysfunction 2/2 pulm HTN   . Generalized anxiety disorder 03/27/2015  . HTN (hypertension) 2005   . Hx of cataract surgery 11/03/2014  . LUQ abdominal pain 07/17/2015  . Medication management 05/12/2015  . Morbid obesity ([place]) 09/08/2014  . 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 Non-English speaking patient 09/08/2014  . OSA (obstructive sleep apnea) 05/20/2015  . Osteopenia 11/19/2013   Dx with osteopenia in lumbar vertebra with partial compression of L1  . Pain due to onychomycosis of toenail 03/27/2015  . Secondary Cushing's syndrome/ iatrogenic   . Skin rash 07/17/2015  . Tachycardia 01/13/2015  . Upper airway cough syndrome 09/05/2014   Followed in Pulmonary clinic/  [facility]/ [place]  - Trial off advair      09/05/2014 >>>  - trial off theoph 09/17/2014 and added flutter  - rec reduce dulera to 100 2 bid 07/13/2015     . Vision changes 06/25/2015  . Vitamin D deficiency 08/28/2014   Past Surgical History:  Procedure Laterality Date  . CATARACT EXTRACTION Bilateral 2014  . HAND SURGERY Right 12/18/2015  . VAGINAL HYSTERECTOMY     Family History  Problem Relation Age of Onset  . Diabetes Mother   . Stroke Father   . Asthma Father   . Hypertension Sister   . Hypertension Brother    Social History  Substance Use Topics  . Smoking status: Never Smoker  . Smokeless tobacco: Never Used  . Alcohol use No   OB History    No data available     Review of Systems  Constitutional: Negative.   HENT: Negative.   Respiratory: Negative.   Cardiovascular: Negative for chest pain.  Gastrointestinal: Negative.   Skin:       Right second toe pain in the distal phalanx around the nail. No history of trauma.  Neurological: Negative.   All other systems reviewed and are negative.   Allergies  Shellfish allergy; Septra [sulfamethoxazole-trimethoprim]; and Hydrocortisone  Home Medications   Prior to Admission medications   Medication Sig Start Date End Date Taking? Authorizing Provider  acetaminophen-codeine (TYLENOL #3) 300-30 MG tablet TAKE 1 TO 2 TABLETS BY MOUTH AT BEDTIME AS NEEDED FOR MODERATE PAIN 09/24/15  Boykin Nearing, MD  albuterol (PROVENTIL HFA;VENTOLIN HFA) 108 (90 Base) MCG/ACT inhaler Inhale 2 puffs into the lungs every 4 (four) hours as needed for wheezing or shortness of breath (((PLAN B))).    Historical Provider, MD  albuterol (PROVENTIL) (2.5 MG/3ML) 0.083% nebulizer solution USE 1 VIAL VIA NEBULIZER EVERY 4 HOURS AS NEEDED WHEEZING OR SHORTNESS OF BREATH 04/29/16   Boykin Nearing, MD  aspirin 325 MG tablet Take 325 mg by mouth every morning.    Historical Provider, MD  atorvastatin (LIPITOR) 20 MG tablet TAKE 1 TABLET BY  MOUTH DAILY 10/10/16   Boykin Nearing, MD  Betamethasone Valerate 0.12 % foam Apply 1 application topically daily. To scalp 07/18/16   Boykin Nearing, MD  Blood Glucose Monitoring Suppl (ACCU-CHEK AVIVA PLUS) W/DEVICE KIT Used as directed 11/05/14   Boykin Nearing, MD  buprenorphine (BUTRANS - DOSED MCG/HR) 5 MCG/HR PTWK patch Place 5 mcg onto the skin once a week.    Historical Provider, MD  cephALEXin (KEFLEX) 500 MG capsule Take 1 capsule (500 mg total) by mouth 4 (four) times daily. 10/23/16   Janne Napoleon, NP  chlorpheniramine (CHLOR-TRIMETON) 4 MG tablet Take 2 tablets by mouth at bedtime    Historical Provider, MD  cholecalciferol (VITAMIN D) 1000 UNITS tablet Take 1,000 Units by mouth every morning.    Historical Provider, MD  clonazePAM (KLONOPIN) 0.5 MG tablet TAKE 1 TABLET BY MOUTH TWICE DAILY AS NEEDED FOR ANXIETY OR SLEEP 06/23/16   Boykin Nearing, MD  cloNIDine (CATAPRES) 0.2 MG tablet Take 1 tablet (0.2 mg total) by mouth 3 (three) times daily. NEED OV. 01/25/16   Fay Records, MD  clotrimazole-betamethasone (LOTRISONE) cream Apply to affected area 2 times daily prn 03/20/16   Billy Fischer, MD  dextromethorphan-guaiFENesin [facility] DM) 30-600 MG 12hr tablet Take 1 tablet by mouth 2 (two) times daily as needed (with flutter valve for cough and congestion). Reported on 04/01/2016    Historical Provider, MD  DULERA 100-5 MCG/ACT AERO INHALE 2 PUFFS BY MOUTH IN THE MORNING THEN ANOTHER 2 PUFFS ABOUT 12 HOURS LATER 07/25/16   Juanito Doom, MD  ferrous sulfate 325 (65 FE) MG tablet Take 1 tablet (325 mg total) by mouth daily with breakfast. 12/31/14   Josalyn Funches, MD  fluticasone (CUTIVATE) 0.05 % cream Apply topically 2 (two) times daily. 02/09/16   Josalyn Funches, MD  FOLIC ACID PO Take 1 tablet by mouth every morning.    Historical Provider, MD  furosemide (LASIX) 40 MG tablet Take 1 tablet (40 mg total) by mouth every morning. 03/27/15   Josalyn Funches, MD  gabapentin (NEURONTIN) 400  MG capsule Take 2 capsules (800 mg total) by mouth 3 (three) times daily. 01/11/16   Josalyn Funches, MD  glucose blood (RELION GLUCOSE TEST STRIPS) test strip 1 each by Other route 4 (four) times daily. ICD code E11.65 07/09/15   Boykin Nearing, MD  HUMULIN R U-500 KWIKPEN 500 UNIT/ML kwikpen Inject 100-160 Units into the skin 3 (three) times daily. 160 U before breakfast, 140 U before lunch, 120 U before dinner 06/01/16   Historical Provider, MD  Insulin Pen Needle (B-D ULTRAFINE III SHORT PEN) 31G X 8 MM MISC 1 application by Does not apply route daily. 03/27/15   Josalyn Funches, MD  Insulin Syringe-Needle U-100 (BD INSULIN SYRINGE ULTRAFINE) 31G X 15/64" 1 ML MISC 1 each by Does not apply route 3 (three) times daily. 03/27/15   Boykin Nearing, MD  Lancet Devices [facility]) lancets  Use as instructed 04/03/15   Josalyn Funches, MD  lidocaine (LIDODERM) 5 % Place 1 patch onto the skin daily. Remove & Discard patch within 12 hours or as directed by MD 01/11/16   Boykin Nearing, MD  losartan (COZAAR) 100 MG tablet TAKE 1 TABLET BY MOUTH EVERY MORNING 09/27/16   Boykin Nearing, MD  metFORMIN (GLUCOPHAGE) 1000 MG tablet Take 1 tablet (1,000 mg total) by mouth 2 (two) times daily with a meal. 12/21/15   Josalyn Funches, MD  methocarbamol (ROBAXIN) 750 MG tablet Take 1 tablet (750 mg total) by mouth every 8 (eight) hours as needed for muscle spasms. 01/11/16   Josalyn Funches, MD  methylphenidate (RITALIN LA) 10 MG 24 hr capsule Take 10 mg by mouth daily.    Historical Provider, MD  metoCLOPramide (REGLAN) 10 MG tablet Take 1 tablet (10 mg total) by mouth every 6 (six) hours. 01/11/16   Boykin Nearing, MD  Misc. Devices (CANE) MISC 1 each by Does not apply route daily. 07/18/16   Josalyn Funches, MD  montelukast (SINGULAIR) 10 MG tablet TAKE 1 TABLET BY MOUTH AT BEDTIME 07/05/16   Josalyn Funches, MD  mupirocin ointment (BACTROBAN) 2 % Place 1 application into the nose 2 (two) times daily. 07/18/16    Boykin Nearing, MD  omeprazole (PRILOSEC) 20 MG capsule Take 1 capsule (20 mg total) by mouth daily before breakfast. 01/11/16   Boykin Nearing, MD  [place] Calcium 500 MG TABS Take 1 tablet (1,250 mg total) by mouth daily. 07/18/16   Josalyn Funches, MD  PARoxetine (PAXIL) 20 MG tablet Take 1 tablet (20 mg total) by mouth daily. 06/07/16   Josalyn Funches, MD  predniSONE (DELTASONE) 1 MG tablet Take 4 tablets (4 mg total) by mouth daily with breakfast. 05/12/16   Boykin Nearing, MD  ranitidine (ZANTAC) 300 MG tablet TAKE ONE TABLET BY MOUTH AT BEDTIME 07/14/16   Josalyn Funches, MD  ReliOn Ultra Thin Lancets MISC 1 each by Does not apply route 4 (four) times daily. ICD code E11.65 03/27/15   Boykin Nearing, MD  Respiratory Therapy Supplies (FLUTTER) DEVI Use as directed 09/16/14   Tanda Rockers, MD  Spacer/Aero-Holding Chambers (AEROCHAMBER MV) inhaler Use as instructed 07/05/16   Juanito Doom, MD  Flossie Buffy MICRO PEN NEEDLES 32G X 4 MM MISC USE AS DIRECTED 09/08/15   Boykin Nearing, MD  verapamil (CALAN-SR) 240 MG CR tablet Take 1 tablet (240 mg total) by mouth at bedtime. 10/06/16   Boykin Nearing, MD   Meds Ordered and Administered this Visit  Medications - No data to display  BP 149/71 (BP Location: Right Arm)   Pulse 94   Temp 98.1 F (36.7 C) (Oral)   Resp 20   SpO2 98%  No data found.   Physical Exam  Constitutional: She is oriented to person, place, and time. She appears well-developed and well-nourished. No distress.  Cardiovascular: Normal rate.   Pulmonary/Chest: Effort normal.  Musculoskeletal:  The toenail of the right second toe is thick and yellow with onychomycosis. There is hypertrophy of the underlying nailbed and minor swelling to the distal soft tissue surrounding the nail. No paronychia. No fluctuance. Positive for minor erythema. The drainage is scant and honey-colored. No purulence. Positive for tenderness.  Neurological: She is alert and oriented to  person, place, and time.  Skin: Skin is warm and dry.  Psychiatric: She has a normal mood and affect.  Nursing note and vitals reviewed.   Urgent Care Course  Procedures (including critical care time)  Labs Review Labs Reviewed - No data to display  Imaging Review No results found.   Visual Acuity Review  Right Eye Distance:   Left Eye Distance:   Bilateral Distance:    Right Eye Near:   Left Eye Near:    Bilateral Near:         MDM   1. Onychomycosis   2. Infection of nailbed of toe of right foot    You have a toenail and infection with fungus. Surrounding this area the soft tissue is turning a little red and swollen suggesting a early bacterial type of infection. We will treat with an antibiotic and soak the toe and warm salt water a couple times a day. Call your podiatrist tomorrow for follow-up appointment. Meds ordered this encounter  Medications  . cephALEXin (KEFLEX) 500 MG capsule    Sig: Take 1 capsule (500 mg total) by mouth 4 (four) times daily.    Dispense:  28 capsule    Refill:  0    Order Specific Question:   Supervising Provider    Answer:   Sherlene Shams [[id number]]       Janne Napoleon, NP 10/23/16 1723

## 2016-10-24 ENCOUNTER — Ambulatory Visit (INDEPENDENT_AMBULATORY_CARE_PROVIDER_SITE_OTHER): Payer: Medicaid Other | Admitting: Podiatry

## 2016-10-24 DIAGNOSIS — L600 Ingrowing nail: Secondary | ICD-10-CM | POA: Diagnosis not present

## 2016-10-24 DIAGNOSIS — M79674 Pain in right toe(s): Secondary | ICD-10-CM

## 2016-10-24 MED ORDER — DOXYCYCLINE HYCLATE 100 MG PO TABS: 100.0000 mg | ORAL_TABLET | Freq: Two times a day (BID) | ORAL | 0 refills | Status: DC

## 2016-10-24 NOTE — Progress Notes (Signed)
   Subjective:  Patient presents today for evaluation of an infected right second digit toenail. Patient went to urgent care yesterday and was prescribed cephalexin. Patient has had pain with drainage for approximately 2 days now. No alleviation of symptoms.    Objective/Physical Exam General: The patient is alert and oriented x3 in no acute distress.  Dermatology: Thickened, onychodystrophy, discolored toenail noted to the second digit right foot with purulent drainage. Skin is warm, dry and supple bilateral lower extremities. Negative for open lesions or macerations.  Vascular: Palpable pedal pulses bilaterally. No edema or erythema noted. Capillary refill within normal limits.  Neurological: Epicritic and protective threshold grossly intact bilaterally.   Musculoskeletal Exam: Range of motion within normal limits to all pedal and ankle joints bilateral. Muscle strength 5/5 in all groups bilateral.    Assessment: #1 paronychia with periungual purulent drainage second digit right foot   Plan of Care:  #1 Patient was evaluated. #2 today the decision was made to temporarily remove the second digit toenail. Prior to temporary avulsion procedure, local anesthesia infiltration was utilized to numb the second digit. The second digit was prepared and aseptic manner and the second digit toenail was removed in toto. #3 dry sterile dressing was applied #4 soaking instructions were provided to patient #5 prescription for doxycycline 100 mg. Discontinue cephalexin #6 return to clinic in 2 weeks  Patient will present in 2 weeks with an interpreter. Patient wants to discuss her fungal nail to the left great toenail.   Edrick Kins, DPM [facility]  Dr. Edrick Kins, [place] [address]                                        [place], [place] [address]                Office [phone]  Fax [phone]

## 2016-10-24 NOTE — Patient Instructions (Signed)

## 2016-11-03 ENCOUNTER — Other Ambulatory Visit: Payer: Self-pay | Admitting: Family Medicine

## 2016-11-03 DIAGNOSIS — F411 Generalized anxiety disorder: Secondary | ICD-10-CM

## 2016-11-04 NOTE — Telephone Encounter (Signed)
Called pt. To verify appt. For 11/07/16 and pt. Requested a refill on the following medications:   clonazePAM (KLONOPIN) 0.5 MG tablet   predniSONE (DELTASONE) 1 MG tablet   Please f/u

## 2016-11-07 ENCOUNTER — Encounter: Payer: Self-pay | Admitting: Family Medicine

## 2016-11-07 ENCOUNTER — Encounter: Payer: Self-pay | Admitting: Podiatry

## 2016-11-07 ENCOUNTER — Ambulatory Visit (INDEPENDENT_AMBULATORY_CARE_PROVIDER_SITE_OTHER): Payer: Medicaid Other | Admitting: Podiatry

## 2016-11-07 ENCOUNTER — Ambulatory Visit: Payer: Medicaid Other | Attending: Family Medicine | Admitting: Family Medicine

## 2016-11-07 ENCOUNTER — Ambulatory Visit: Payer: Medicaid Other | Admitting: Pulmonary Disease

## 2016-11-07 VITALS — BP 149/78 | HR 91 | Temp 98.4°F | Ht <= 58 in | Wt 199.4 lb

## 2016-11-07 DIAGNOSIS — M4854XA Collapsed vertebra, not elsewhere classified, thoracic region, initial encounter for fracture: Secondary | ICD-10-CM | POA: Diagnosis not present

## 2016-11-07 DIAGNOSIS — J45998 Other asthma: Secondary | ICD-10-CM | POA: Diagnosis not present

## 2016-11-07 DIAGNOSIS — S22080A Wedge compression fracture of T11-T12 vertebra, initial encounter for closed fracture: Secondary | ICD-10-CM | POA: Diagnosis not present

## 2016-11-07 DIAGNOSIS — Z79899 Other long term (current) drug therapy: Secondary | ICD-10-CM | POA: Insufficient documentation

## 2016-11-07 DIAGNOSIS — M818 Other osteoporosis without current pathological fracture: Secondary | ICD-10-CM | POA: Insufficient documentation

## 2016-11-07 DIAGNOSIS — R319 Hematuria, unspecified: Secondary | ICD-10-CM

## 2016-11-07 DIAGNOSIS — Z794 Long term (current) use of insulin: Secondary | ICD-10-CM | POA: Diagnosis not present

## 2016-11-07 DIAGNOSIS — B351 Tinea unguium: Secondary | ICD-10-CM

## 2016-11-07 DIAGNOSIS — G8929 Other chronic pain: Secondary | ICD-10-CM | POA: Insufficient documentation

## 2016-11-07 DIAGNOSIS — T380X5A Adverse effect of glucocorticoids and synthetic analogues, initial encounter: Secondary | ICD-10-CM | POA: Diagnosis not present

## 2016-11-07 DIAGNOSIS — S32010S Wedge compression fracture of first lumbar vertebra, sequela: Secondary | ICD-10-CM

## 2016-11-07 DIAGNOSIS — M722 Plantar fascial fibromatosis: Secondary | ICD-10-CM

## 2016-11-07 DIAGNOSIS — I10 Essential (primary) hypertension: Secondary | ICD-10-CM | POA: Diagnosis not present

## 2016-11-07 DIAGNOSIS — M7752 Other enthesopathy of left foot: Secondary | ICD-10-CM

## 2016-11-07 DIAGNOSIS — Z7982 Long term (current) use of aspirin: Secondary | ICD-10-CM | POA: Insufficient documentation

## 2016-11-07 DIAGNOSIS — J45909 Unspecified asthma, uncomplicated: Secondary | ICD-10-CM | POA: Diagnosis not present

## 2016-11-07 DIAGNOSIS — IMO0001 Reserved for inherently not codable concepts without codable children: Secondary | ICD-10-CM

## 2016-11-07 DIAGNOSIS — M7751 Other enthesopathy of right foot: Secondary | ICD-10-CM

## 2016-11-07 DIAGNOSIS — E1165 Type 2 diabetes mellitus with hyperglycemia: Secondary | ICD-10-CM | POA: Insufficient documentation

## 2016-11-07 DIAGNOSIS — M545 Low back pain: Secondary | ICD-10-CM | POA: Diagnosis not present

## 2016-11-07 LAB — POCT GLYCOSYLATED HEMOGLOBIN (HGB A1C): Hemoglobin A1C: 7.8

## 2016-11-07 LAB — POCT URINALYSIS DIPSTICK
Bilirubin, UA: NEGATIVE
Glucose, UA: 500
Ketones, UA: NEGATIVE
Leukocytes, UA: NEGATIVE
Nitrite, UA: NEGATIVE
Protein, UA: >=300
Spec Grav, UA: 1.02
Urobilinogen, UA: 0.2
pH, UA: 6

## 2016-11-07 LAB — GLUCOSE, POCT (MANUAL RESULT ENTRY): POC Glucose: 337 mg/dl — AB (ref 70–99)

## 2016-11-07 MED ORDER — INSULIN ASPART 100 UNIT/ML ~~LOC~~ SOLN
10.0000 [IU] | Freq: Once | SUBCUTANEOUS | Status: AC
  Administered 2016-11-07: 10 [IU] via SUBCUTANEOUS

## 2016-11-07 MED ORDER — HUMULIN R U-500 KWIKPEN 500 UNIT/ML ~~LOC~~ SOPN: 120.0000 [IU] | PEN_INJECTOR | Freq: Three times a day (TID) | SUBCUTANEOUS | 11 refills | Status: DC

## 2016-11-07 MED ORDER — VERAPAMIL HCL ER 180 MG PO TBCR: 360.0000 mg | EXTENDED_RELEASE_TABLET | Freq: Every day | ORAL | 3 refills | Status: DC

## 2016-11-07 MED ORDER — CLONIDINE HCL 0.2 MG PO TABS: 0.2000 mg | ORAL_TABLET | Freq: Three times a day (TID) | ORAL | 5 refills | Status: DC

## 2016-11-07 MED ORDER — ALBIGLUTIDE 30 MG ~~LOC~~ PEN: 30.0000 mg | PEN_INJECTOR | SUBCUTANEOUS | 2 refills | Status: DC

## 2016-11-07 MED ORDER — LOSARTAN POTASSIUM 100 MG PO TABS: 100.0000 mg | ORAL_TABLET | Freq: Every morning | ORAL | 3 refills | Status: DC

## 2016-11-07 NOTE — Assessment & Plan Note (Signed)
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

## 2016-11-07 NOTE — Assessment & Plan Note (Signed)
A; HTN P: Increase hydralazine back to 360 mg daily Continue all other medications the same

## 2016-11-07 NOTE — Progress Notes (Signed)
Subjective:  Patient ID: Raven Turner, female    DOB: 1971/08/24  Age: 46 y.o. MRN: [id number]  CC: Hypertension and Diabetes   HPI Raven Turner has steroid induced osteoporosis and Cushing syndrome, morbid obesity, fatty liver,  GERD, HTN, DM, Thoracic and lumbar compression fractures presents for    1. CHRONIC DIABETES  Disease Monitoring  Blood Sugar Ranges:   Fasting 239   Postprandial 180-200s, followed by Endocrinology   Polyuria: no   Visual problems: no   Medication Compliance: yes, sh take metformin 1000 mg BID, Humulin- R U-500 180 in AM, 140 at lunch and 120 at dinner. She is gaining weight. She reports increase appetite.  Medication Side Effects  Hypoglycemia: no   Preventitive Health Care  Eye Exam: due   Foot Exam: done recently    2. HTN: compliant with regimen. No swelling. No HA, CP or SOB. Her diltiazem was decreased to 240 mg due to 360 mg dose being discontinued. Her BP is slightly elevated. She has noted darkening and bubbly appearance of urine.   3. Asthma: she has been out of prednisone for 2 days. She reports shortness of breath with exertion.  4. Chronic back pain: due to steroid induced thoracic and lumbar  compression fractures. She cannot fit her back brace due to weight gain. She request a new brace. She has chronic pain that has worsened since she has not had her brace. She walks with a cane.   Social History  Substance Use Topics  . Smoking status: Never Smoker  . Smokeless tobacco: Never Used  . Alcohol use No   Outpatient Medications Prior to Visit  Medication Sig Dispense Refill  . acetaminophen-codeine (TYLENOL #3) 300-30 MG tablet TAKE 1 TO 2 TABLETS BY MOUTH AT BEDTIME AS NEEDED FOR MODERATE PAIN 60 tablet 2  . albuterol (PROVENTIL HFA;VENTOLIN HFA) 108 (90 Base) MCG/ACT inhaler Inhale 2 puffs into the lungs every 4 (four) hours as needed for wheezing or shortness of breath (((PLAN B))).    Marland Kitchen albuterol (PROVENTIL)  (2.5 MG/3ML) 0.083% nebulizer solution USE 1 VIAL VIA NEBULIZER EVERY 4 HOURS AS NEEDED WHEEZING OR SHORTNESS OF BREATH 90 mL 3  . aspirin 325 MG tablet Take 325 mg by mouth every morning.    Marland Kitchen atorvastatin (LIPITOR) 20 MG tablet TAKE 1 TABLET BY MOUTH DAILY 90 tablet 0  . Betamethasone Valerate 0.12 % foam Apply 1 application topically daily. To scalp 100 g 0  . Blood Glucose Monitoring Suppl (ACCU-CHEK AVIVA PLUS) W/DEVICE KIT Used as directed 1 kit 0  . buprenorphine (BUTRANS - DOSED MCG/HR) 5 MCG/HR PTWK patch Place 5 mcg onto the skin once a week.    . cephALEXin (KEFLEX) 500 MG capsule Take 1 capsule (500 mg total) by mouth 4 (four) times daily. 28 capsule 0  . chlorpheniramine (CHLOR-TRIMETON) 4 MG tablet Take 2 tablets by mouth at bedtime    . cholecalciferol (VITAMIN D) 1000 UNITS tablet Take 1,000 Units by mouth every morning.    . clonazePAM (KLONOPIN) 0.5 MG tablet TAKE 1 TABLET BY MOUTH TWICE DAILY AS NEEDED FOR ANXIETY OR SLEEP 60 tablet 2  . cloNIDine (CATAPRES) 0.2 MG tablet Take 1 tablet (0.2 mg total) by mouth 3 (three) times daily. NEED OV. 90 tablet 0  . clotrimazole-betamethasone (LOTRISONE) cream Apply to affected area 2 times daily prn 45 g 0  . dextromethorphan-guaiFENesin (MUCINEX DM) 30-600 MG 12hr tablet Take 1 tablet by mouth 2 (two) times daily as needed (  with flutter valve for cough and congestion). Reported on 04/01/2016    . doxycycline (VIBRA-TABS) 100 MG tablet Take 1 tablet (100 mg total) by mouth 2 (two) times daily. 20 tablet 0  . DULERA 100-5 MCG/ACT AERO INHALE 2 PUFFS BY MOUTH IN THE MORNING THEN ANOTHER 2 PUFFS ABOUT 12 HOURS LATER 13 g 3  . ferrous sulfate 325 (65 FE) MG tablet Take 1 tablet (325 mg total) by mouth daily with breakfast. 30 tablet 3  . fluticasone (CUTIVATE) 0.05 % cream Apply topically 2 (two) times daily. 30 g 0  . FOLIC ACID PO Take 1 tablet by mouth every morning.    . furosemide (LASIX) 40 MG tablet Take 1 tablet (40 mg total) by mouth  every morning. 30 tablet 2  . gabapentin (NEURONTIN) 400 MG capsule Take 2 capsules (800 mg total) by mouth 3 (three) times daily. 180 capsule 5  . glucose blood (RELION GLUCOSE TEST STRIPS) test strip 1 each by Other route 4 (four) times daily. ICD code E11.65 400 each 3  . HUMULIN R U-500 KWIKPEN 500 UNIT/ML kwikpen Inject 100-160 Units into the skin 3 (three) times daily. 160 U before breakfast, 140 U before lunch, 120 U before dinner  11  . Insulin Pen Needle (B-D ULTRAFINE III SHORT PEN) 31G X 8 MM MISC 1 application by Does not apply route daily. 100 each 3  . Insulin Syringe-Needle U-100 (BD INSULIN SYRINGE ULTRAFINE) 31G X 15/64" 1 ML MISC 1 each by Does not apply route 3 (three) times daily. 300 each 3  . Lancet Devices (ACCU-CHEK SOFTCLIX) lancets Use as instructed 1 each 0  . lidocaine (LIDODERM) 5 % Place 1 patch onto the skin daily. Remove & Discard patch within 12 hours or as directed by MD 30 patch 0  . losartan (COZAAR) 100 MG tablet TAKE 1 TABLET BY MOUTH EVERY MORNING 90 tablet 0  . metFORMIN (GLUCOPHAGE) 1000 MG tablet Take 1 tablet (1,000 mg total) by mouth 2 (two) times daily with a meal. 60 tablet 11  . methocarbamol (ROBAXIN) 750 MG tablet Take 1 tablet (750 mg total) by mouth every 8 (eight) hours as needed for muscle spasms. 90 tablet 11  . methylphenidate (RITALIN LA) 10 MG 24 hr capsule Take 10 mg by mouth daily.    . metoCLOPramide (REGLAN) 10 MG tablet Take 1 tablet (10 mg total) by mouth every 6 (six) hours. 30 tablet 3  . Misc. Devices (CANE) MISC 1 each by Does not apply route daily. 1 each 0  . montelukast (SINGULAIR) 10 MG tablet TAKE 1 TABLET BY MOUTH AT BEDTIME 30 tablet 3  . mupirocin ointment (BACTROBAN) 2 % Place 1 application into the nose 2 (two) times daily. 22 g 0  . omeprazole (PRILOSEC) 20 MG capsule Take 1 capsule (20 mg total) by mouth daily before breakfast. 30 capsule 11  . [place] Calcium 500 MG TABS Take 1 tablet (1,250 mg total) by mouth  daily. 90 tablet 3  . PARoxetine (PAXIL) 20 MG tablet Take 1 tablet (20 mg total) by mouth daily. 30 tablet 3  . predniSONE (DELTASONE) 1 MG tablet Take 4 tablets (4 mg total) by mouth daily with breakfast. 120 tablet 0  . ranitidine (ZANTAC) 300 MG tablet TAKE ONE TABLET BY MOUTH AT BEDTIME 30 tablet 3  . ReliOn Ultra Thin Lancets MISC 1 each by Does not apply route 4 (four) times daily. ICD code E11.65 400 each 3  . Respiratory Therapy Supplies (  FLUTTER) DEVI Use as directed 1 each 0  . Spacer/Aero-Holding Chambers (AEROCHAMBER MV) inhaler Use as instructed 1 each 0  . ULTICARE MICRO PEN NEEDLES 32G X 4 MM MISC USE AS DIRECTED 100 each 2  . verapamil (CALAN-SR) 240 MG CR tablet Take 1 tablet (240 mg total) by mouth at bedtime. 30 tablet 5   Facility-Administered Medications Prior to Visit  Medication Dose Route Frequency Provider Last Rate Last Dose  . DOBUTamine (DOBUTREX) 1,000 mcg/mL in dextrose 5% 250 mL infusion  30 mcg/kg/min Intravenous Continuous Mihai Croitoru, MD 145.3 mL/hr at 04/22/15 1300 30 mcg/kg/min at 04/22/15 1300    ROS Review of Systems  Constitutional: Negative for chills and fever.  HENT: Negative for congestion.   Eyes: Positive for visual disturbance.  Respiratory: Positive for shortness of breath. Negative for cough.   Cardiovascular: Negative for chest pain.  Gastrointestinal: Negative for abdominal distention, abdominal pain, anal bleeding, blood in stool, constipation, diarrhea, nausea, rectal pain and vomiting.  Musculoskeletal: Positive for arthralgias, back pain and myalgias.  Skin: Positive for rash. Negative for wound.  Allergic/Immunologic: Negative for immunocompromised state.  Hematological: Negative for adenopathy. Does not bruise/bleed easily.  Psychiatric/Behavioral: Negative for dysphoric mood and suicidal ideas.    Objective:  BP (!) 149/78 (BP Location: Left Arm, Patient Position: Sitting, Cuff Size: Large)   Pulse 91   Temp 98.4 F (36.9  C) (Oral)   Ht _0  (1.473 m)   Wt 199 lb 6.4 oz (90.4 kg)   LMP 11/05/2016   SpO2 97%   BMI 41.67 kg/m   BP/Weight 11/07/2016 3/32/9518 04/16/1659  Systolic BP 630 160 -  Diastolic BP 78 71 -  Wt. (Lbs) 199.4 - 192  BMI 41.67 - 40.13   BP Readings from Last 3 Encounters:  11/07/16 (!) 149/78  10/23/16 149/71  07/25/16 122/76   Wt Readings from Last 3 Encounters:  11/07/16 199 lb 6.4 oz (90.4 kg)  09/15/16 192 lb (87.1 kg)  07/25/16 192 lb 12.8 oz (87.5 kg)     Physical Exam  Constitutional: She is oriented to person, place, and time. She appears well-developed and well-nourished. No distress.  Obese   HENT:  Head: Normocephalic and atraumatic.  Cardiovascular: Normal rate, regular rhythm, normal heart sounds and intact distal pulses.   Pulmonary/Chest: Effort normal. She has wheezes.  Abdominal:  Obese   Musculoskeletal: She exhibits no edema.  Neurological: She is alert and oriented to person, place, and time.  Skin: Skin is warm and dry. No rash noted.  Psychiatric: She has a normal mood and affect.   Lab Results  Component Value Date   HGBA1C 7.7 07/18/2016   Lab Results  Component Value Date   HGBA1C 7.8 11/07/2016    CBG 337   Treated with 10 U of novolog  Repeat CBG   UA: 500 glucose, large RBC, < 300 protein  Assessment & Plan:   Raven Turner was seen today for hypertension and diabetes.  Diagnoses and all orders for this visit:  Uncontrolled type 2 diabetes mellitus without complication, with long-term current use of insulin (HCC) -     POCT glucose (manual entry) -     POCT glycosylated hemoglobin (Hb A1C) -     insulin aspart (novoLOG) injection 10 Units; Inject 0.1 mLs (10 Units total) into the skin once. -     Albiglutide (TANZEUM) 30 MG PEN; Inject 30 mg into the skin once a week. -     POCT urinalysis dipstick -  BASIC METABOLIC PANEL WITH GFR  Essential (primary) hypertension -     verapamil (CALAN-SR) 180 MG CR tablet; Take 2  tablets (360 mg total) by mouth at bedtime. -     cloNIDine (CATAPRES) 0.2 MG tablet; Take 1 tablet (0.2 mg total) by mouth 3 (three) times daily. -     POCT urinalysis dipstick -     losartan (COZAAR) 100 MG tablet; Take 1 tablet (100 mg total) by mouth every morning. -     BASIC METABOLIC PANEL WITH GFR  Compression fracture of T12 vertebra (HCC)  Closed compression fracture of first lumbar vertebra, sequela  Steroid-induced osteoporosis  Hematuria, unspecified type -     Urine culture  Chronic midline low back pain, with sciatica presence unspecified  Asthma ? severity vs VCD/ pseudo asthma    No orders of the defined types were placed in this encounter.   Follow-up: Return in about 6 weeks (around 12/19/2016) for diabetes .   Boykin Nearing MD

## 2016-11-07 NOTE — Assessment & Plan Note (Signed)
Wheezing and SOB off prednisone 4 mg daily No hypoxemia  Refilled prednisone

## 2016-11-07 NOTE — Assessment & Plan Note (Signed)
A: diabetes hyperglycemia, rise in A1c, weight gain P: Add tanzeum She is to continue close f/u with Endocrinology

## 2016-11-07 NOTE — Patient Instructions (Addendum)
Raven Turner was seen today for hypertension and diabetes.  Diagnoses and all orders for this visit:  Uncontrolled type 2 diabetes mellitus without complication, with long-term current use of insulin (HCC) -     POCT glucose (manual entry) -     POCT glycosylated hemoglobin (Hb A1C) -     insulin aspart (novoLOG) injection 10 Units; Inject 0.1 mLs (10 Units total) into the skin once. -     Albiglutide (TANZEUM) 30 MG PEN; Inject 30 mg into the skin once a week. -     POCT urinalysis dipstick  Essential (primary) hypertension -     verapamil (CALAN-SR) 180 MG CR tablet; Take 2 tablets (360 mg total) by mouth at bedtime. -     cloNIDine (CATAPRES) 0.2 MG tablet; Take 1 tablet (0.2 mg total) by mouth 3 (three) times daily. -     POCT urinalysis dipstick  Compression fracture of T12 vertebra (HCC)  Closed compression fracture of first lumbar vertebra, sequela   Your prescription for custom spinal orthosis along with office visit note with be faxed to [facility] Please call [facility] to make an appointment.   [facility]: [place], [address] [place], [place] [address](  Phone [phone]  Fax [phone]    F/u in 6 weeks for diabetes   Dr. Adrian Blackwater

## 2016-11-08 ENCOUNTER — Other Ambulatory Visit: Payer: Self-pay | Admitting: Family Medicine

## 2016-11-08 DIAGNOSIS — J45998 Other asthma: Secondary | ICD-10-CM

## 2016-11-08 LAB — BASIC METABOLIC PANEL WITH GFR
BUN: 13 mg/dL (ref 7–25)
CO2: 26 mmol/L (ref 20–31)
Calcium: 10.1 mg/dL (ref 8.6–10.2)
Chloride: 100 mmol/L (ref 98–110)
Creat: 0.84 mg/dL (ref 0.50–1.10)
GFR, Est African American: >89 mL/min (ref >=60)
GFR, Est Non African American: 85 mL/min (ref >=60)
Glucose, Bld: 307 mg/dL — ABNORMAL HIGH (ref 65–99)
Potassium: 4.6 mmol/L (ref 3.5–5.3)
Sodium: 137 mmol/L (ref 135–146)

## 2016-11-09 ENCOUNTER — Other Ambulatory Visit: Payer: Self-pay | Admitting: Family Medicine

## 2016-11-09 DIAGNOSIS — G8929 Other chronic pain: Secondary | ICD-10-CM

## 2016-11-09 DIAGNOSIS — M549 Dorsalgia, unspecified: Secondary | ICD-10-CM

## 2016-11-09 DIAGNOSIS — J45998 Other asthma: Secondary | ICD-10-CM

## 2016-11-09 LAB — URINE CULTURE

## 2016-11-11 ENCOUNTER — Telehealth: Payer: Self-pay

## 2016-11-11 NOTE — Telephone Encounter (Signed)
Pt was called and informed of lab results. 

## 2016-11-13 MED ORDER — BETAMETHASONE SOD PHOS & ACET 6 (3-3) MG/ML IJ SUSP: 3.0000 mg | Freq: Once | INTRAMUSCULAR | Status: DC

## 2016-11-13 NOTE — Progress Notes (Signed)
   Subjective:  Patient presents today for follow-up evaluation of total temporary removal the second toenail right foot. Patient states that she took her antibiotic and at the moment she does not express any pain or drainage regarding the toenail avulsion site. Patient also has a new complaint today of pain to the bilateral feet which been going on for several weeks now. Patient states that it hurts when she walks for excessive amounts of time. Painful and regular shoe gear. Patient denies trauma.    Objective/Physical Exam General: The patient is alert and oriented x3 in no acute distress.  Dermatology: Hyperkeratotic discolored and thickened toenail noted to the left great toenail. Not symptomatic. Skin is warm, dry and supple bilateral lower extremities. Negative for open lesions or macerations.  Vascular: Palpable pedal pulses bilaterally. No edema or erythema noted. Capillary refill within normal limits.  Neurological: Epicritic and protective threshold grossly intact bilaterally.   Musculoskeletal Exam: Pain on palpation and range of motion to the first MPJ right foot as well as MPJs 2 and 4 of the left lower extremity.  Assessment: #1 MPJ capsulitis first MPJ right foot #2 MPJ capsulitis 2, for left foot #3 fungal nail left great toe  Plan of Care:  #1 Patient was evaluated. #2 injection of 0.5 mL Celestone Soluspan injected in the first MPJ right foot #3 injection of 0.5 mL Celestone Soluspan injected into metatarsophalangeal joint 2, for left foot. #4 discussed wearing good shoes gear and supportive arches and forefoot #5 return to clinic in 4 weeks   Edrick Kins, DPM [facility]  Dr. Edrick Kins, [address]                                        [place], [address] [address]                Office [phone]  Fax [phone]

## 2016-11-18 LAB — FUNGUS CULTURE W SMEAR

## 2016-12-02 ENCOUNTER — Other Ambulatory Visit: Payer: Self-pay | Admitting: Family Medicine

## 2016-12-02 DIAGNOSIS — F411 Generalized anxiety disorder: Secondary | ICD-10-CM

## 2016-12-05 ENCOUNTER — Encounter: Payer: Self-pay | Admitting: Podiatry

## 2016-12-05 ENCOUNTER — Ambulatory Visit (INDEPENDENT_AMBULATORY_CARE_PROVIDER_SITE_OTHER): Payer: Medicaid Other | Admitting: Podiatry

## 2016-12-05 DIAGNOSIS — M79609 Pain in unspecified limb: Secondary | ICD-10-CM | POA: Diagnosis not present

## 2016-12-05 DIAGNOSIS — L603 Nail dystrophy: Secondary | ICD-10-CM | POA: Diagnosis not present

## 2016-12-05 DIAGNOSIS — L608 Other nail disorders: Secondary | ICD-10-CM | POA: Diagnosis not present

## 2016-12-05 DIAGNOSIS — B351 Tinea unguium: Secondary | ICD-10-CM | POA: Diagnosis not present

## 2016-12-07 LAB — CULT, FUNGUS, SKIN,HAIR,NAIL W/KOH

## 2016-12-09 NOTE — Progress Notes (Signed)
   Subjective: Patient presents today for follow-up evaluation of capsulitis to the first MPJ right foot and second MPJ left foot. She also presents today for follow-up treatment and evaluation of possible toenail fungus to the left great toe.  Objective: Physical Exam General: The patient is alert and oriented x3 in no acute distress.  Dermatology: Hyperkeratotic, discolored, thickened, onychodystrophy of nails noted to the left great toenail.  Skin is warm, dry and supple bilateral lower extremities. Negative for open lesions or macerations.  Vascular: Palpable pedal pulses bilaterally. No edema or erythema noted. Capillary refill within normal limits.  Neurological: Epicritic and protective threshold grossly intact bilaterally.   Musculoskeletal Exam: Range of motion within normal limits to all pedal and ankle joints bilateral. Muscle strength 5/5 in all groups bilateral.   Assessment: #1 possible onychomycosis left great toenail #2 first MPJ capsulitis right foot #3 second MPJ capsulitis left foot  Plan of Care:  #1 Patient was evaluated. #2 today the patient is positive for onychomycosis. There was a delay in getting the pathology report. We will contact the patient and initiate topical antifungal nail lacquer through [facility]. #3 the patient has a history of a fatty liver and cannot take oral Lamisil #4 return to clinic when necessary   Edrick Kins, DPM [facility]  Dr. Edrick Kins, [place]                                        [place], [place] [address]                Office [phone]  Fax [phone]

## 2016-12-12 ENCOUNTER — Telehealth: Payer: Self-pay | Admitting: *Deleted

## 2016-12-12 MED ORDER — NONFORMULARY OR COMPOUNDED ITEM: 2 refills | Status: DC

## 2016-12-12 NOTE — Telephone Encounter (Addendum)
-----   Message from Edrick Kins, DPM sent at 12/09/2016  7:10 PM EDT ----- Regarding: Please order [facility] antifungal nail lacquer Please order antifungal nail lacquer through [facility]. Patient was last seen Monday, 12/05/2016, to go over nail biopsy results. Results were not available during office visit.   Please let the patient now she is positive for onychomycosis. Please order antifungal nail lacquer.  Return to clinic PRN.    note dictated. Thanks, Dr. Amalia Hailey. 12/12/2016-Left message informing pt of results and [facility] information.Orders faxed to [facility].

## 2016-12-16 ENCOUNTER — Other Ambulatory Visit: Payer: Self-pay | Admitting: Family Medicine

## 2016-12-16 DIAGNOSIS — M549 Dorsalgia, unspecified: Principal | ICD-10-CM

## 2016-12-16 DIAGNOSIS — G8929 Other chronic pain: Secondary | ICD-10-CM

## 2016-12-16 DIAGNOSIS — J45998 Other asthma: Secondary | ICD-10-CM

## 2016-12-19 ENCOUNTER — Ambulatory Visit: Payer: Medicaid Other | Attending: Family Medicine | Admitting: Family Medicine

## 2016-12-19 ENCOUNTER — Encounter: Payer: Self-pay | Admitting: Family Medicine

## 2016-12-19 ENCOUNTER — Ambulatory Visit (INDEPENDENT_AMBULATORY_CARE_PROVIDER_SITE_OTHER): Payer: Medicaid Other | Admitting: Pulmonary Disease

## 2016-12-19 ENCOUNTER — Encounter: Payer: Self-pay | Admitting: Pulmonary Disease

## 2016-12-19 VITALS — BP 129/78 | HR 84 | Temp 97.9°F | Ht <= 58 in | Wt 202.4 lb

## 2016-12-19 DIAGNOSIS — E229 Hyperfunction of pituitary gland, unspecified: Secondary | ICD-10-CM | POA: Diagnosis not present

## 2016-12-19 DIAGNOSIS — N925 Other specified irregular menstruation: Secondary | ICD-10-CM | POA: Insufficient documentation

## 2016-12-19 DIAGNOSIS — K219 Gastro-esophageal reflux disease without esophagitis: Secondary | ICD-10-CM | POA: Insufficient documentation

## 2016-12-19 DIAGNOSIS — E1121 Type 2 diabetes mellitus with diabetic nephropathy: Secondary | ICD-10-CM

## 2016-12-19 DIAGNOSIS — Z6841 Body Mass Index (BMI) 40.0 and over, adult: Secondary | ICD-10-CM | POA: Diagnosis not present

## 2016-12-19 DIAGNOSIS — R946 Abnormal results of thyroid function studies: Secondary | ICD-10-CM | POA: Diagnosis not present

## 2016-12-19 DIAGNOSIS — Z794 Long term (current) use of insulin: Secondary | ICD-10-CM | POA: Insufficient documentation

## 2016-12-19 DIAGNOSIS — K760 Fatty (change of) liver, not elsewhere classified: Secondary | ICD-10-CM | POA: Diagnosis not present

## 2016-12-19 DIAGNOSIS — Z79899 Other long term (current) drug therapy: Secondary | ICD-10-CM | POA: Diagnosis not present

## 2016-12-19 DIAGNOSIS — N926 Irregular menstruation, unspecified: Secondary | ICD-10-CM | POA: Diagnosis not present

## 2016-12-19 DIAGNOSIS — I10 Essential (primary) hypertension: Secondary | ICD-10-CM | POA: Diagnosis not present

## 2016-12-19 DIAGNOSIS — E1129 Type 2 diabetes mellitus with other diabetic kidney complication: Secondary | ICD-10-CM | POA: Insufficient documentation

## 2016-12-19 DIAGNOSIS — E1165 Type 2 diabetes mellitus with hyperglycemia: Secondary | ICD-10-CM | POA: Insufficient documentation

## 2016-12-19 DIAGNOSIS — Z7982 Long term (current) use of aspirin: Secondary | ICD-10-CM | POA: Insufficient documentation

## 2016-12-19 DIAGNOSIS — R809 Proteinuria, unspecified: Secondary | ICD-10-CM

## 2016-12-19 DIAGNOSIS — R7989 Other specified abnormal findings of blood chemistry: Secondary | ICD-10-CM | POA: Insufficient documentation

## 2016-12-19 DIAGNOSIS — IMO0001 Reserved for inherently not codable concepts without codable children: Secondary | ICD-10-CM

## 2016-12-19 LAB — POCT URINALYSIS DIPSTICK
Glucose, UA: 100
Ketones, UA: NEGATIVE
Leukocytes, UA: NEGATIVE
Nitrite, UA: NEGATIVE
Protein, UA: 300
Spec Grav, UA: 1.03 (ref 1.030–1.035)
Urobilinogen, UA: 0.2 (ref ?–2.0)
pH, UA: 5 (ref 5.0–8.0)

## 2016-12-19 LAB — GLUCOSE, POCT (MANUAL RESULT ENTRY): POC Glucose: 252 mg/dl — AB (ref 70–99)

## 2016-12-19 LAB — POCT GLYCOSYLATED HEMOGLOBIN (HGB A1C): Hemoglobin A1C: 8.3

## 2016-12-19 MED ORDER — ALBIGLUTIDE 50 MG ~~LOC~~ PEN: 50.0000 mg | PEN_INJECTOR | SUBCUTANEOUS | 3 refills | Status: DC

## 2016-12-19 MED ORDER — MOMETASONE FURO-FORMOTEROL FUM 200-5 MCG/ACT IN AERO: 2.0000 | INHALATION_SPRAY | Freq: Two times a day (BID) | RESPIRATORY_TRACT | 5 refills | Status: DC

## 2016-12-19 NOTE — Progress Notes (Signed)
Subjective:    Patient ID: Raven Turner, female    DOB: [birth year], 46 y.o.   MRN: [id number]  Synopsis: Former patient of Dr. Melvyn Novas with recurrent wheeze, shortness of breath and cough felt to be due to either vocal cord dysfunction or asthma.   Evaluated by [facility] in 2017, no paradoxical movements of the vocal cords noted   HPI Chief Complaint  Patient presents with  . Follow-up    pt c/o sob with exertion.     Raven Turner says that she is gaining weight and "it is killing her".  She went to her PCP today and has been told that her thyroid function may be abnormal.  She says she has gained at least 6 pounds in the last.   She tries to exercise regulalry.   She says she hasn't had much trouble with her lungs.  She says that she is trying to take lower doses of the prednisone.  She says she still uses albuterol about 2-3 times per week.  She has wheezing "awlays.  She says that when she walks she has some chest tightness.  She has a dry coug hfrom time to time.  She says that she always feels phlegm in her throat "all the time".   Past Medical History:  Diagnosis Date  . Anemia   . Asthma   . Bilateral hand numbness 06/25/2015  . Bone pain 02/27/2015   Diffuse bone pain in legs mostly but also in arms    . Breast pain, left 03/27/2015  . Callus of heel 12/16/2014  . Compression fracture   . Compression fracture of L1 lumbar vertebra (HCC) 08/26/2014  . Compression fracture of T12 vertebra ([place]) 08/28/2014  . COPD (chronic obstructive pulmonary disease) ([place]) 2013  . Cushing syndrome ([place]) 2013   . Depression   . Diabetes ([place]) 2013   . Diabetes mellitus type 2, uncontrolled ([place]) 08/26/2014   Sees Dr. Katy Fitch.  Last visit 11/27/14.  No diabetic retinopathy    . Diabetes mellitus with neuropathy ([place])   . Diastolic dysfunction without heart failure 10/14/2014   Grade 2 diastolic dysfunction 2/2 pulm HTN   . Generalized anxiety disorder 03/27/2015  . HTN  (hypertension) 2005   . Hx of cataract surgery 11/03/2014  . LUQ abdominal pain 07/17/2015  . Medication management 05/12/2015  . Morbid obesity ([place]) 09/08/2014  . 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 Non-English speaking patient 09/08/2014  . OSA (obstructive sleep apnea) 05/20/2015  . Osteopenia 11/19/2013   Dx with osteopenia in lumbar vertebra with partial compression of L1  . Pain due to onychomycosis of toenail 03/27/2015  . Secondary Cushing's syndrome/ iatrogenic   . Skin rash 07/17/2015  . Tachycardia 01/13/2015  . Upper airway cough syndrome 09/05/2014   Followed in Pulmonary clinic/ [facility]/ [place]  - Trial off advair      09/05/2014 >>>  - trial off theoph 09/17/2014 and added flutter  - rec reduce dulera to 100 2 bid 07/13/2015     . Vision changes 06/25/2015  . Vitamin D deficiency 08/28/2014        Review of Systems  Constitutional: Negative for chills, fatigue and fever.  HENT: Negative for postnasal drip, rhinorrhea and sinus pressure.   Respiratory: Positive for cough, shortness of breath and wheezing.   Cardiovascular: Negative for chest pain, palpitations and leg swelling.       Objective:   Physical Exam Vitals:   12/19/16 1543  BP: 132/66  Pulse: 93  SpO2: 97%  Weight: 201 lb (91.2 kg)  Height: 4' 10"  (1.473 m)  RA  Gen: well appearing HENT: OP clear, TM's clear, neck supple PULM: CTA B, normal percussion CV: RRR, no mgr, trace edema GI: BS+, soft, nontender Derm: no cyanosis or rash Psyche: normal mood and affect   PFT 11/03/14  PFTs p am dulera 100 on 30 mg prednisone  FEV1 1.56(73%) ratio 85 =  No obst, including fef 25-75%, only abn is reduction in ERV spirometry 03/31/15 1.48 (62%) ratio 82  p am dulera and while symptomatic requesting saba  July 2017 IgE 55 (normal), CBC without eosinophils  April 2018 primary care physician visit reviewed were she was cared for for her diabetes and hypertension     Assessment & Plan:  Morbid obesity  ([place]) This is truly her primary problem. It's unclear to me whether or not she really has asthma as her wheeze is only intermittent and only occurs over her throat.  The prednisone she's been taking for years as making the obesity worse, contributing to hypertension, injuring contributing to diabetes. We really need to try to come off of it altogether. She has quite a bit of anxiety about this because she's been on the medicine for years.  Plan: Decrease dose of prednisone to 3 mg daily while increasing the treatment for her asthma (though admittedly I'm unsure whether or not she even has asthma) Return to clinic in 6 weeks, if stable decrease dose of prednisone to 2 mg daily  Asthma ? severity vs VCD/ pseudo asthma I continue to doubt whether or not she actually has asthma. However, she's convinced that she has it. She's also been taking prednisone for years for this condition.  I strongly suspect the postnasal drip from allergic rhinitis contributes to her sooner wheeze.  Plan: Increased dose of Dulera to 202 puffs twice a day while decreasing the maintenance dose of prednisone to 3 mg daily Reduce dose of prednisone further next visit  . 50% of this 26 minute visit spent face-to-face    Current Outpatient Prescriptions:  .  acetaminophen-codeine (TYLENOL #3) 300-30 MG tablet, TAKE 1 TO 2 TABLETS BY MOUTH AT BEDTIME AS NEEDED FOR MODERATE PAIN, Disp: 60 tablet, Rfl: 2 .  Albiglutide (TANZEUM) 50 MG PEN, Inject 50 mg into the skin once a week., Disp: 4 each, Rfl: 3 .  albuterol (PROVENTIL HFA;VENTOLIN HFA) 108 (90 Base) MCG/ACT inhaler, Inhale 2 puffs into the lungs every 4 (four) hours as needed for wheezing or shortness of breath (((PLAN B)))., Disp: , Rfl:  .  albuterol (PROVENTIL) (2.5 MG/3ML) 0.083% nebulizer solution, USE 1 VIAL VIA NEBULIZER EVERY 4 HOURS AS NEEDED FOR WHEEZING OR SHORTNESS OF BREATH, Disp: 90 mL, Rfl: 0 .  aspirin 325 MG tablet, Take 325 mg by mouth every  morning., Disp: , Rfl:  .  atorvastatin (LIPITOR) 20 MG tablet, TAKE 1 TABLET BY MOUTH DAILY, Disp: 90 tablet, Rfl: 0 .  Betamethasone Valerate 0.12 % foam, Apply 1 application topically daily. To scalp, Disp: 100 g, Rfl: 0 .  Blood Glucose Monitoring Suppl (ACCU-CHEK AVIVA PLUS) W/DEVICE KIT, Used as directed, Disp: 1 kit, Rfl: 0 .  buprenorphine (BUTRANS - DOSED MCG/HR) 5 MCG/HR PTWK patch, Place 5 mcg onto the skin once a week., Disp: , Rfl:  .  chlorpheniramine (CHLOR-TRIMETON) 4 MG tablet, Take 2 tablets by mouth at bedtime, Disp: , Rfl:  .  cholecalciferol (VITAMIN D) 1000 UNITS tablet, Take 1,000 Units  by mouth every morning., Disp: , Rfl:  .  clonazePAM (KLONOPIN) 0.5 MG tablet, TAKE 1 TABLET BY MOUTH TWICE DAILY AS NEEDED FOR ANXIETY OR SLEEP, Disp: 60 tablet, Rfl: 5 .  cloNIDine (CATAPRES) 0.2 MG tablet, Take 1 tablet (0.2 mg total) by mouth 3 (three) times daily., Disp: 90 tablet, Rfl: 5 .  clotrimazole-betamethasone (LOTRISONE) cream, Apply to affected area 2 times daily prn, Disp: 45 g, Rfl: 0 .  dextromethorphan-guaiFENesin (MUCINEX DM) 30-600 MG 12hr tablet, Take 1 tablet by mouth 2 (two) times daily as needed (with flutter valve for cough and congestion). Reported on 04/01/2016, Disp: , Rfl:  .  DULERA 100-5 MCG/ACT AERO, INHALE 2 PUFFS BY MOUTH IN THE MORNING THEN ANOTHER 2 PUFFS ABOUT 12 HOURS LATER, Disp: 13 g, Rfl: 3 .  ferrous sulfate 325 (65 FE) MG tablet, Take 1 tablet (325 mg total) by mouth daily with breakfast., Disp: 30 tablet, Rfl: 3 .  fluticasone (CUTIVATE) 0.05 % cream, Apply topically 2 (two) times daily., Disp: 30 g, Rfl: 0 .  FOLIC ACID PO, Take 1 tablet by mouth every morning., Disp: , Rfl:  .  furosemide (LASIX) 40 MG tablet, Take 1 tablet (40 mg total) by mouth every morning., Disp: 30 tablet, Rfl: 2 .  gabapentin (NEURONTIN) 400 MG capsule, TAKE 2 CAPSULES BY MOUTH THREE TIMES DAILY, Disp: 180 capsule, Rfl: 3 .  glucose blood (RELION GLUCOSE TEST STRIPS) test  strip, 1 each by Other route 4 (four) times daily. ICD code E11.65, Disp: 400 each, Rfl: 3 .  HUMULIN R U-500 KWIKPEN 500 UNIT/ML kwikpen, Inject 120-180 Units into the skin 3 (three) times daily. 180 U before breakfast, 140 U before lunch, 120 U before dinner, Disp: 27 mL, Rfl: 11 .  Insulin Pen Needle (B-D ULTRAFINE III SHORT PEN) 31G X 8 MM MISC, 1 application by Does not apply route daily., Disp: 100 each, Rfl: 3 .  Insulin Syringe-Needle U-100 (BD INSULIN SYRINGE ULTRAFINE) 31G X 15/64" 1 ML MISC, 1 each by Does not apply route 3 (three) times daily., Disp: 300 each, Rfl: 3 .  Lancet Devices (ACCU-CHEK SOFTCLIX) lancets, Use as instructed, Disp: 1 each, Rfl: 0 .  losartan (COZAAR) 100 MG tablet, Take 1 tablet (100 mg total) by mouth every morning., Disp: 90 tablet, Rfl: 3 .  metFORMIN (GLUCOPHAGE) 1000 MG tablet, Take 1 tablet (1,000 mg total) by mouth 2 (two) times daily with a meal., Disp: 60 tablet, Rfl: 11 .  methocarbamol (ROBAXIN) 750 MG tablet, Take 1 tablet (750 mg total) by mouth every 8 (eight) hours as needed for muscle spasms., Disp: 90 tablet, Rfl: 11 .  methylphenidate (RITALIN LA) 10 MG 24 hr capsule, Take 10 mg by mouth daily., Disp: , Rfl:  .  metoCLOPramide (REGLAN) 10 MG tablet, Take 1 tablet (10 mg total) by mouth every 6 (six) hours., Disp: 30 tablet, Rfl: 3 .  Misc. Devices (CANE) MISC, 1 each by Does not apply route daily., Disp: 1 each, Rfl: 0 .  montelukast (SINGULAIR) 10 MG tablet, TAKE 1 TABLET BY MOUTH EVERY NIGHT AT BEDTIME, Disp: 30 tablet, Rfl: 3 .  mupirocin ointment (BACTROBAN) 2 %, Place 1 application into the nose 2 (two) times daily., Disp: 22 g, Rfl: 0 .  NONFORMULARY OR COMPOUNDED ITEM, [facility]:  Onychomycosis Nail Lacquer - Fluconazole 2%, Terbinafine 1%, apply to affected area daily., Disp: 120 each, Rfl: 2 .  omeprazole (PRILOSEC) 20 MG capsule, Take 1 capsule (20 mg total) by  mouth daily before breakfast., Disp: 30 capsule, Rfl: 11 .  Oyster  Shell Calcium 500 MG TABS, Take 1 tablet (1,250 mg total) by mouth daily., Disp: 90 tablet, Rfl: 3 .  PARoxetine (PAXIL) 20 MG tablet, TAKE 1 TABLET BY MOUTH EVERY DAY, Disp: 30 tablet, Rfl: 3 .  predniSONE (DELTASONE) 1 MG tablet, TAKE 4 TABLETS BY MOUTH ONCE DAILY WITH BREAKFAST, Disp: 120 tablet, Rfl: 2 .  ranitidine (ZANTAC) 300 MG tablet, TAKE 1 TABLET BY MOUTH EVERY NIGHT AT BEDTIME, Disp: 30 tablet, Rfl: 2 .  ReliOn Ultra Thin Lancets MISC, 1 each by Does not apply route 4 (four) times daily. ICD code E11.65, Disp: 400 each, Rfl: 3 .  Respiratory Therapy Supplies (FLUTTER) DEVI, Use as directed, Disp: 1 each, Rfl: 0 .  Spacer/Aero-Holding Chambers (AEROCHAMBER MV) inhaler, Use as instructed, Disp: 1 each, Rfl: 0 .  ULTICARE MICRO PEN NEEDLES 32G X 4 MM MISC, USE AS DIRECTED, Disp: 100 each, Rfl: 2 .  verapamil (CALAN-SR) 180 MG CR tablet, Take 2 tablets (360 mg total) by mouth at bedtime., Disp: 180 tablet, Rfl: 3  Current Facility-Administered Medications:  .  betamethasone acetate-betamethasone sodium phosphate (CELESTONE) injection 3 mg, 3 mg, Intramuscular, Once, Edrick Kins, DPM  Facility-Administered Medications Ordered in Other Visits:  .  DOBUTamine (DOBUTREX) 1,000 mcg/mL in dextrose 5% 250 mL infusion, 30 mcg/kg/min, Intravenous, Continuous, Mihai Croitoru, MD, Last Rate: 145.3 mL/hr at 04/22/15 1300, 30 mcg/kg/min at 04/22/15 1300

## 2016-12-19 NOTE — Assessment & Plan Note (Signed)
This is truly her primary problem. It's unclear to me whether or not she really has asthma as her wheeze is only intermittent and only occurs over her throat.  The prednisone she's been taking for years as making the obesity worse, contributing to hypertension, injuring contributing to diabetes. We really need to try to come off of it altogether. She has quite a bit of anxiety about this because she's been on the medicine for years.  Plan: Decrease dose of prednisone to 3 mg daily while increasing the treatment for her asthma (though admittedly I'm unsure whether or not she even has asthma) Return to clinic in 6 weeks, if stable decrease dose of prednisone to 2 mg daily

## 2016-12-19 NOTE — Assessment & Plan Note (Signed)
I continue to doubt whether or not she actually has asthma. However, she's convinced that she has it. She's also been taking prednisone for years for this condition.  I strongly suspect the postnasal drip from allergic rhinitis contributes to her sooner wheeze.  Plan: Increased dose of Dulera to 202 puffs twice a day while decreasing the maintenance dose of prednisone to 3 mg daily Reduce dose of prednisone further next visit  . 50% of this 26 minute visit spent face-to-face

## 2016-12-19 NOTE — Assessment & Plan Note (Signed)
A; urine microalbumin of 80.3 Urine creatinine of 89 P: Reduce CBGs Keep BP under control No NSAIDs Renal ultrasound ordered

## 2016-12-19 NOTE — Assessment & Plan Note (Signed)
A: improved CBGs with tanzeum, still gaining weight. Has urine microalbuminuria P: Losartan 100 mg daily Continue current regimen

## 2016-12-19 NOTE — Patient Instructions (Signed)
Decrease prednisone to 3 mg daily Take generic Zyrtec (cetirizine) daily Keep taking Singulair Take Dulera 2 puffs twice a day, we will increase the dose We will see you back in 4-6 weeks with our nurse practitioner, if you are doing well we will lower the dose of the prednisone even more

## 2016-12-19 NOTE — Patient Instructions (Addendum)
Raven Turner was seen today for diabetes.  Diagnoses and all orders for this visit:  Uncontrolled type 2 diabetes mellitus without complication, with long-term current use of insulin (HCC) -     POCT glucose (manual entry) -     POCT glycosylated hemoglobin (Hb A1C) -     Albiglutide (TANZEUM) 50 MG PEN; Inject 50 mg into the skin once a week.  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 Adrian Blackwater

## 2016-12-19 NOTE — Assessment & Plan Note (Signed)
A: well controlled with increase dose of verapamil back to 360 mg daily Med: compliant P: continue current regimen

## 2016-12-19 NOTE — Progress Notes (Signed)
Subjective:  Patient ID: [name], female    DOB: 1970/10/28  Age: 46 y.o. MRN: [id number]  CC: Diabetes   HPI Lakyn Alsteen has steroid induced osteoporosis and Cushing syndrome, morbid obesity, fatty liver,  GERD, HTN, DM,thoracic and lumbar compression fractures presents for   Spanish interpreter  [place], [place] # [id number]  1. CHRONIC DIABETES  Disease Monitoring  Blood Sugar Ranges:   Fasting 239   Postprandial 180-200s, followed by Endocrinology   Polyuria: no   Visual problems: no   Medication Compliance: yes, still gaining weight despite tanzeum.  Medication Side Effects  Hypoglycemia: no   Preventitive Health Care  Eye Exam: due   Foot Exam: done recently    2. HTN: compliant with regimen. No swelling. No HA, CP or SOB. Her diltiazem was decreased to 240 mg due to 360 mg dose being discontinued. Her BP is slightly elevated. She has noted darkening and bubbly appearance of urine.   3. Weight gain: she continues to gain weight despite starting tanzeum 30 mg weekly, that was then increased to 50 mg weekly by her Endocrinologist. She was found to have elevated TSH of 5.13 on recent labs. She has skipped a period last month with shortened periods for the 3 months prior.   Social History  Substance Use Topics  . Smoking status: Never Smoker  . Smokeless tobacco: Never Used  . Alcohol use No   Outpatient Medications Prior to Visit  Medication Sig Dispense Refill  . acetaminophen-codeine (TYLENOL #3) 300-30 MG tablet TAKE 1 TO 2 TABLETS BY MOUTH AT BEDTIME AS NEEDED FOR MODERATE PAIN 60 tablet 2  . Albiglutide (TANZEUM) 30 MG PEN Inject 30 mg into the skin once a week. 4 each 2  . albuterol (PROVENTIL HFA;VENTOLIN HFA) 108 (90 Base) MCG/ACT inhaler Inhale 2 puffs into the lungs every 4 (four) hours as needed for wheezing or shortness of breath (((PLAN B))).    Marland Kitchen albuterol (PROVENTIL) (2.5 MG/3ML) 0.083% nebulizer solution USE 1 VIAL VIA NEBULIZER EVERY 4  HOURS AS NEEDED FOR WHEEZING OR SHORTNESS OF BREATH 90 mL 0  . aspirin 325 MG tablet Take 325 mg by mouth every morning.    Marland Kitchen atorvastatin (LIPITOR) 20 MG tablet TAKE 1 TABLET BY MOUTH DAILY 90 tablet 0  . Blood Glucose Monitoring Suppl (ACCU-CHEK AVIVA PLUS) W/DEVICE KIT Used as directed 1 kit 0  . buprenorphine (BUTRANS - DOSED MCG/HR) 5 MCG/HR PTWK patch Place 5 mcg onto the skin once a week.    . chlorpheniramine (CHLOR-TRIMETON) 4 MG tablet Take 2 tablets by mouth at bedtime    . cholecalciferol (VITAMIN D) 1000 UNITS tablet Take 1,000 Units by mouth every morning.    . clonazePAM (KLONOPIN) 0.5 MG tablet TAKE 1 TABLET BY MOUTH TWICE DAILY AS NEEDED FOR ANXIETY OR SLEEP 60 tablet 5  . cloNIDine (CATAPRES) 0.2 MG tablet Take 1 tablet (0.2 mg total) by mouth 3 (three) times daily. 90 tablet 5  . dextromethorphan-guaiFENesin (MUCINEX DM) 30-600 MG 12hr tablet Take 1 tablet by mouth 2 (two) times daily as needed (with flutter valve for cough and congestion). Reported on 04/01/2016    . DULERA 100-5 MCG/ACT AERO INHALE 2 PUFFS BY MOUTH IN THE MORNING THEN ANOTHER 2 PUFFS ABOUT 12 HOURS LATER 13 g 3  . ferrous sulfate 325 (65 FE) MG tablet Take 1 tablet (325 mg total) by mouth daily with breakfast. 30 tablet 3  . FOLIC ACID PO Take 1 tablet by mouth  every morning.    . furosemide (LASIX) 40 MG tablet Take 1 tablet (40 mg total) by mouth every morning. 30 tablet 2  . gabapentin (NEURONTIN) 400 MG capsule TAKE 2 CAPSULES BY MOUTH THREE TIMES DAILY 180 capsule 3  . glucose blood (RELION GLUCOSE TEST STRIPS) test strip 1 each by Other route 4 (four) times daily. ICD code E11.65 400 each 3  . HUMULIN R U-500 KWIKPEN 500 UNIT/ML kwikpen Inject 120-180 Units into the skin 3 (three) times daily. 180 U before breakfast, 140 U before lunch, 120 U before dinner 27 mL 11  . Insulin Pen Needle (B-D ULTRAFINE III SHORT PEN) 31G X 8 MM MISC 1 application by Does not apply route daily. 100 each 3  . Insulin  Syringe-Needle U-100 (BD INSULIN SYRINGE ULTRAFINE) 31G X 15/64" 1 ML MISC 1 each by Does not apply route 3 (three) times daily. 300 each 3  . Lancet Devices (ACCU-CHEK SOFTCLIX) lancets Use as instructed 1 each 0  . losartan (COZAAR) 100 MG tablet Take 1 tablet (100 mg total) by mouth every morning. 90 tablet 3  . metFORMIN (GLUCOPHAGE) 1000 MG tablet Take 1 tablet (1,000 mg total) by mouth 2 (two) times daily with a meal. 60 tablet 11  . methocarbamol (ROBAXIN) 750 MG tablet Take 1 tablet (750 mg total) by mouth every 8 (eight) hours as needed for muscle spasms. 90 tablet 11  . methylphenidate (RITALIN LA) 10 MG 24 hr capsule Take 10 mg by mouth daily.    . metoCLOPramide (REGLAN) 10 MG tablet Take 1 tablet (10 mg total) by mouth every 6 (six) hours. 30 tablet 3  . Misc. Devices (CANE) MISC 1 each by Does not apply route daily. 1 each 0  . montelukast (SINGULAIR) 10 MG tablet TAKE 1 TABLET BY MOUTH EVERY NIGHT AT BEDTIME 30 tablet 3  . NONFORMULARY OR COMPOUNDED ITEM [facility]:  Onychomycosis Nail Lacquer - Fluconazole 2%, Terbinafine 1%, apply to affected area daily. 120 each 2  . omeprazole (PRILOSEC) 20 MG capsule Take 1 capsule (20 mg total) by mouth daily before breakfast. 30 capsule 11  . Oyster Shell Calcium 500 MG TABS Take 1 tablet (1,250 mg total) by mouth daily. 90 tablet 3  . PARoxetine (PAXIL) 20 MG tablet TAKE 1 TABLET BY MOUTH EVERY DAY 30 tablet 3  . predniSONE (DELTASONE) 1 MG tablet TAKE 4 TABLETS BY MOUTH ONCE DAILY WITH BREAKFAST 120 tablet 2  . ranitidine (ZANTAC) 300 MG tablet TAKE 1 TABLET BY MOUTH EVERY NIGHT AT BEDTIME 30 tablet 2  . ReliOn Ultra Thin Lancets MISC 1 each by Does not apply route 4 (four) times daily. ICD code E11.65 400 each 3  . Respiratory Therapy Supplies (FLUTTER) DEVI Use as directed 1 each 0  . Spacer/Aero-Holding Chambers (AEROCHAMBER MV) inhaler Use as instructed 1 each 0  . ULTICARE MICRO PEN NEEDLES 32G X 4 MM MISC USE AS DIRECTED 100  each 2  . verapamil (CALAN-SR) 180 MG CR tablet Take 2 tablets (360 mg total) by mouth at bedtime. 180 tablet 3  . Betamethasone Valerate 0.12 % foam Apply 1 application topically daily. To scalp (Patient not taking: Reported on 11/07/2016) 100 g 0  . clotrimazole-betamethasone (LOTRISONE) cream Apply to affected area 2 times daily prn (Patient not taking: Reported on 11/07/2016) 45 g 0  . fluticasone (CUTIVATE) 0.05 % cream Apply topically 2 (two) times daily. (Patient not taking: Reported on 11/07/2016) 30 g 0  . mupirocin ointment (BACTROBAN)  2 % Place 1 application into the nose 2 (two) times daily. (Patient not taking: Reported on 11/07/2016) 22 g 0   Facility-Administered Medications Prior to Visit  Medication Dose Route Frequency Provider Last Rate Last Dose  . betamethasone acetate-betamethasone sodium phosphate (CELESTONE) injection 3 mg  3 mg Intramuscular Once Edrick Kins, DPM      . DOBUTamine (DOBUTREX) 1,000 mcg/mL in dextrose 5% 250 mL infusion  30 mcg/kg/min Intravenous Continuous Mihai Croitoru, MD 145.3 mL/hr at 04/22/15 1300 30 mcg/kg/min at 04/22/15 1300    ROS Review of Systems  Constitutional: Negative for chills and fever.  HENT: Negative for congestion.   Eyes: Negative for visual disturbance.  Respiratory: Negative for cough and shortness of breath.   Cardiovascular: Negative for chest pain.  Gastrointestinal: Negative for abdominal distention, abdominal pain, anal bleeding, blood in stool, constipation, diarrhea, nausea, rectal pain and vomiting.  Genitourinary:       Frothy and dark yellow urine   Musculoskeletal: Positive for arthralgias, back pain and myalgias.  Skin: Positive for rash. Negative for wound.  Allergic/Immunologic: Negative for immunocompromised state.  Hematological: Negative for adenopathy. Does not bruise/bleed easily.  Psychiatric/Behavioral: Negative for dysphoric mood and suicidal ideas.    Objective:  BP 129/78   Pulse 84   Temp 97.9 F  (36.6 C) (Oral)   Ht '4\' 10"'$  (1.473 m)   Wt 202 lb 6.4 oz (91.8 kg)   LMP 11/07/2016   SpO2 97%   BMI 42.30 kg/m   BP/Weight 12/19/2016 11/07/2016 1/61/0960  Systolic BP 454 098 119  Diastolic BP 78 78 71  Wt. (Lbs) 202.4 199.4 -  BMI 42.3 41.67 -   BP Readings from Last 3 Encounters:  12/19/16 129/78  11/07/16 (!) 149/78  10/23/16 149/71   Wt Readings from Last 3 Encounters:  12/19/16 202 lb 6.4 oz (91.8 kg)  11/07/16 199 lb 6.4 oz (90.4 kg)  09/15/16 192 lb (87.1 kg)    Physical Exam  Constitutional: She is oriented to person, place, and time. She appears well-developed and well-nourished. No distress.  Obese   HENT:  Head: Normocephalic and atraumatic.  Cardiovascular: Normal rate, regular rhythm, normal heart sounds and intact distal pulses.   Pulmonary/Chest: Effort normal. She has wheezes.  Abdominal:  Obese   Musculoskeletal: She exhibits no edema.  Neurological: She is alert and oriented to person, place, and time.  Skin: Skin is warm and dry. No rash noted.  Psychiatric: She has a normal mood and affect.   Lab Results  Component Value Date   HGBA1C 8.3 12/19/2016   CBG 252   UA:  Dark yellow, large RBC, 300 protein, 100 glucose   Assessment & Plan:   Coreen was seen today for diabetes.  Diagnoses and all orders for this visit:  Uncontrolled type 2 diabetes mellitus without complication, with long-term current use of insulin (HCC) -     POCT glucose (manual entry) -     POCT glycosylated hemoglobin (Hb A1C) -     Albiglutide (TANZEUM) 50 MG PEN; Inject 50 mg into the skin once a week.  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   No orders of the defined types were  placed in this encounter.   Follow-up: Return in about 8 weeks (around 02/13/2017) for HTN and diabetes .   Boykin Nearing MD

## 2016-12-22 ENCOUNTER — Ambulatory Visit (HOSPITAL_COMMUNITY): Payer: Medicaid Other

## 2016-12-22 LAB — T4, FREE: Free T4: 1.08 ng/dL (ref 0.82–1.77)

## 2016-12-22 LAB — TSH+PRL+FSH+TESTT+LH+DHEA S...
17-Hydroxyprogesterone: 19 ng/dL
Androstenedione: 34 ng/dL — ABNORMAL LOW (ref 41–262)
DHEA-SO4: 6.7 ug/dL — ABNORMAL LOW (ref 57.3–279.2)
FSH: 5 m[IU]/mL
LH: 4 m[IU]/mL
Prolactin: 105.8 ng/mL — ABNORMAL HIGH (ref 4.8–23.3)
TSH: 4.25 u[IU]/mL (ref 0.450–4.500)
Testosterone, Free: <0.2 pg/mL (ref 0.0–4.2)
Testosterone: <3 ng/dL — ABNORMAL LOW (ref 8–48)

## 2016-12-22 LAB — BASIC METABOLIC PANEL
BUN/Creatinine Ratio: 18 (ref 9–23)
BUN: 12 mg/dL (ref 6–24)
CO2: 28 mmol/L (ref 18–29)
Calcium: 9.6 mg/dL (ref 8.7–10.2)
Chloride: 98 mmol/L (ref 96–106)
Creatinine, Ser: 0.66 mg/dL (ref 0.57–1.00)
GFR calc Af Amer: 124 mL/min/{1.73_m2} (ref >59)
GFR calc non Af Amer: 108 mL/min/{1.73_m2} (ref >59)
Glucose: 193 mg/dL — ABNORMAL HIGH (ref 65–99)
Potassium: 4.1 mmol/L (ref 3.5–5.2)
Sodium: 140 mmol/L (ref 134–144)

## 2016-12-22 LAB — T3, FREE: T3, Free: 3.2 pg/mL (ref 2.0–4.4)

## 2016-12-26 ENCOUNTER — Ambulatory Visit (HOSPITAL_COMMUNITY)
Admission: RE | Admit: 2016-12-26 | Discharge: 2016-12-26 | Disposition: A | Discharge status: Home / Self Care | Payer: Medicaid Other | Source: Ambulatory Visit | Attending: Family Medicine | Admitting: Family Medicine

## 2016-12-26 DIAGNOSIS — K760 Fatty (change of) liver, not elsewhere classified: Secondary | ICD-10-CM | POA: Diagnosis not present

## 2016-12-26 DIAGNOSIS — E1121 Type 2 diabetes mellitus with diabetic nephropathy: Secondary | ICD-10-CM | POA: Diagnosis present

## 2017-01-02 ENCOUNTER — Ambulatory Visit: Payer: Medicaid Other | Admitting: Adult Health

## 2017-01-04 ENCOUNTER — Telehealth: Payer: Self-pay

## 2017-01-04 NOTE — Telephone Encounter (Signed)
Pt number is disconnected. Pt lab results will be mailed out.

## 2017-01-05 ENCOUNTER — Other Ambulatory Visit: Payer: Self-pay | Admitting: Family Medicine

## 2017-01-05 DIAGNOSIS — IMO0001 Reserved for inherently not codable concepts without codable children: Secondary | ICD-10-CM

## 2017-01-05 DIAGNOSIS — E1165 Type 2 diabetes mellitus with hyperglycemia: Principal | ICD-10-CM

## 2017-01-09 ENCOUNTER — Ambulatory Visit (HOSPITAL_COMMUNITY)
Admission: RE | Admit: 2017-01-09 | Discharge: 2017-01-09 | Disposition: A | Discharge status: Home / Self Care | Payer: Medicaid Other | Source: Ambulatory Visit | Attending: Psychiatry | Admitting: Psychiatry

## 2017-01-09 ENCOUNTER — Other Ambulatory Visit (HOSPITAL_COMMUNITY): Payer: Self-pay | Admitting: Psychiatry

## 2017-01-09 DIAGNOSIS — M542 Cervicalgia: Secondary | ICD-10-CM | POA: Insufficient documentation

## 2017-01-19 DIAGNOSIS — R7989 Other specified abnormal findings of blood chemistry: Secondary | ICD-10-CM | POA: Insufficient documentation

## 2017-01-19 DIAGNOSIS — E229 Hyperfunction of pituitary gland, unspecified: Secondary | ICD-10-CM

## 2017-01-19 NOTE — Addendum Note (Signed)
Addended by: Boykin Nearing on: 01/19/2017 01:48 PM   Modules accepted: Orders

## 2017-01-19 NOTE — Assessment & Plan Note (Signed)
DHEA and androstenedione are low consistent with menopause Prolactin levels are slightly elevated which is mostly due to antidepressant medications She has normal thyroid studies and normal kidney function  Please ask patient if she has experienced any changed to her peripheral vision?  I have ordered MRI of brain with contrast to check her pituitary ------

## 2017-01-25 ENCOUNTER — Encounter: Payer: Self-pay | Admitting: Family Medicine

## 2017-01-29 NOTE — Progress Notes (Signed)
Cardiology Office Note   Date:  01/30/2017   ID:  Raven Turner, DOB Apr 23, 1971, MRN [id number]  PCP:  Boykin Nearing, MD  Cardiologist:   Dorris Carnes, MD       History of Present Illness: Raven Turner is a 46 y.o. female with a history of chest pain and HTN  I saw him in Dec 2016  Also history of DM and COPD/aastham   Dobutamine echo normal    Pt says everything has been fine but has pain in chest  Pressing Squeezing then lets go  Last week had all the time  Today didn't have    Doesn't do much during day  Breatihg doesn't allow her to   Current Meds  Medication Sig  . acetaminophen-codeine (TYLENOL #3) 300-30 MG tablet TAKE 1 TO 2 TABLETS BY MOUTH AT BEDTIME AS NEEDED FOR MODERATE PAIN  . Albiglutide (TANZEUM) 50 MG PEN Inject 50 mg into the skin once a week.  Marland Kitchen albuterol (PROVENTIL HFA;VENTOLIN HFA) 108 (90 Base) MCG/ACT inhaler Inhale 2 puffs into the lungs every 4 (four) hours as needed for wheezing or shortness of breath (((PLAN B))).  Marland Kitchen albuterol (PROVENTIL) (2.5 MG/3ML) 0.083% nebulizer solution USE 1 VIAL VIA NEBULIZER EVERY 4 HOURS AS NEEDED FOR WHEEZING OR SHORTNESS OF BREATH  . aspirin 325 MG tablet Take 325 mg by mouth every morning.  Marland Kitchen atorvastatin (LIPITOR) 20 MG tablet TAKE 1 TABLET BY MOUTH EVERY DAY  . Betamethasone Valerate 0.12 % foam Apply 1 application topically daily. To scalp  . Blood Glucose Monitoring Suppl (ACCU-CHEK AVIVA PLUS) W/DEVICE KIT Used as directed  . buprenorphine (BUTRANS - DOSED MCG/HR) 5 MCG/HR PTWK patch Place 5 mcg onto the skin once a week.  . cholecalciferol (VITAMIN D) 1000 UNITS tablet Take 1,000 Units by mouth every morning.  . clonazePAM (KLONOPIN) 0.5 MG tablet TAKE 1 TABLET BY MOUTH TWICE DAILY AS NEEDED FOR ANXIETY OR SLEEP  . cloNIDine (CATAPRES) 0.2 MG tablet Take 1 tablet (0.2 mg total) by mouth 3 (three) times daily.  . clotrimazole-betamethasone (LOTRISONE) cream Apply to affected area 2 times daily prn   . dextromethorphan-guaiFENesin (MUCINEX DM) 30-600 MG 12hr tablet Take 1 tablet by mouth 2 (two) times daily as needed (with flutter valve for cough and congestion). Reported on 04/01/2016  . fluticasone (CUTIVATE) 0.05 % cream Apply topically 2 (two) times daily.  Marland Kitchen FOLIC ACID PO Take 1 tablet by mouth every morning.  . furosemide (LASIX) 40 MG tablet Take 1 tablet (40 mg total) by mouth every morning.  . gabapentin (NEURONTIN) 400 MG capsule TAKE 2 CAPSULES BY MOUTH THREE TIMES DAILY  . glucose blood (RELION GLUCOSE TEST STRIPS) test strip 1 each by Other route 4 (four) times daily. ICD code E11.65  . HUMULIN R U-500 KWIKPEN 500 UNIT/ML kwikpen Inject 120-180 Units into the skin 3 (three) times daily. 180 U before breakfast, 140 U before lunch, 120 U before dinner  . Insulin Pen Needle (B-D ULTRAFINE III SHORT PEN) 31G X 8 MM MISC 1 application by Does not apply route daily.  . Insulin Syringe-Needle U-100 (BD INSULIN SYRINGE ULTRAFINE) 31G X 15/64" 1 ML MISC 1 each by Does not apply route 3 (three) times daily.  Elmore Guise Devices (ACCU-CHEK SOFTCLIX) lancets Use as instructed  . losartan (COZAAR) 100 MG tablet Take 1 tablet (100 mg total) by mouth every morning.  . metFORMIN (GLUCOPHAGE) 1000 MG tablet TAKE 1 TABLET BY MOUTH TWICE DAILY WITH A MEAL  .  methocarbamol (ROBAXIN) 750 MG tablet Take 1 tablet (750 mg total) by mouth every 8 (eight) hours as needed for muscle spasms.  . methylphenidate (RITALIN LA) 10 MG 24 hr capsule Take 10 mg by mouth daily.  . metoCLOPramide (REGLAN) 10 MG tablet Take 1 tablet (10 mg total) by mouth every 6 (six) hours.  . Misc. Devices (CANE) MISC 1 each by Does not apply route daily.  . mometasone-formoterol (DULERA) 200-5 MCG/ACT AERO Inhale 2 puffs into the lungs 2 (two) times daily.  . montelukast (SINGULAIR) 10 MG tablet TAKE 1 TABLET BY MOUTH EVERY NIGHT AT BEDTIME  . mupirocin ointment (BACTROBAN) 2 % Place 1 application into the nose 2 (two) times daily.    Marland Kitchen omeprazole (PRILOSEC) 20 MG capsule Take 1 capsule (20 mg total) by mouth daily before breakfast.  . [place] Calcium 500 MG TABS Take 1 tablet (1,250 mg total) by mouth daily.  Marland Kitchen PARoxetine (PAXIL) 20 MG tablet TAKE 1 TABLET BY MOUTH EVERY DAY  . predniSONE (DELTASONE) 1 MG tablet TAKE 4 TABLETS BY MOUTH ONCE DAILY WITH BREAKFAST  . ranitidine (ZANTAC) 300 MG tablet TAKE 1 TABLET BY MOUTH EVERY NIGHT AT BEDTIME  . ReliOn Ultra Thin Lancets MISC 1 each by Does not apply route 4 (four) times daily. ICD code E11.65  . Respiratory Therapy Supplies (FLUTTER) DEVI Use as directed  . Spacer/Aero-Holding Chambers (AEROCHAMBER MV) inhaler Use as instructed  . ULTICARE MICRO PEN NEEDLES 32G X 4 MM MISC USE AS DIRECTED  . verapamil (CALAN-SR) 180 MG CR tablet Take 2 tablets (360 mg total) by mouth at bedtime.   Current Facility-Administered Medications for the 01/30/17 encounter (Office Visit) with Fay Records, MD  Medication  . betamethasone acetate-betamethasone sodium phosphate (CELESTONE) injection 3 mg     Allergies:   Shellfish allergy; Septra [sulfamethoxazole-trimethoprim]; and Hydrocortisone   Past Medical History:  Diagnosis Date  . Anemia   . Asthma   . Bilateral hand numbness 06/25/2015  . Bone pain 02/27/2015   Diffuse bone pain in legs mostly but also in arms    . Breast pain, left 03/27/2015  . Callus of heel 12/16/2014  . Compression fracture   . Compression fracture of L1 lumbar vertebra (HCC) 08/26/2014  . Compression fracture of T12 vertebra ([place]) 08/28/2014  . COPD (chronic obstructive pulmonary disease) ([place]) 2013  . Cushing syndrome ([place]) 2013   . Depression   . Diabetes ([place]) 2013   . Diabetes mellitus type 2, uncontrolled ([place]) 08/26/2014   Sees Dr. Katy Fitch.  Last visit 11/27/14.  No diabetic retinopathy    . Diabetes mellitus with neuropathy ([place])   . Diastolic dysfunction without heart failure 10/14/2014   Grade 2 diastolic dysfunction 2/2 pulm HTN   .  Generalized anxiety disorder 03/27/2015  . HTN (hypertension) 2005   . Hx of cataract surgery 11/03/2014  . LUQ abdominal pain 07/17/2015  . Medication management 05/12/2015  . Morbid obesity ([place]) 09/08/2014  . 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 Non-English speaking patient 09/08/2014  . OSA (obstructive sleep apnea) 05/20/2015  . Osteopenia 11/19/2013   Dx with osteopenia in lumbar vertebra with partial compression of L1  . Pain due to onychomycosis of toenail 03/27/2015  . Secondary Cushing's syndrome/ iatrogenic   . Skin rash 07/17/2015  . Tachycardia 01/13/2015  . Upper airway cough syndrome 09/05/2014   Followed in Pulmonary clinic/ [facility]/ [facility]  - Trial off advair      09/05/2014 >>>  - trial off  theoph 09/17/2014 and added flutter  - rec reduce dulera to 100 2 bid 07/13/2015     . Vision changes 06/25/2015  . Vitamin D deficiency 08/28/2014    Past Surgical History:  Procedure Laterality Date  . CATARACT EXTRACTION Bilateral 2014  . HAND SURGERY Right 12/18/2015  . VAGINAL HYSTERECTOMY       Social History:  The patient  reports that she has never smoked. She has never used smokeless tobacco. She reports that she does not drink alcohol or use drugs.   Family History:  The patient's family history includes Asthma in her father; Diabetes in her mother; Hypertension in her brother and sister; Stroke in her father.    ROS:  Please see the history of present illness. All other systems are reviewed and  Negative to the above problem except as noted.    PHYSICAL EXAM: VS:  BP 134/76   Pulse 66   Ht 4' 10"  (1.473 m)   Wt 205 lb (93 kg)   LMP 11/07/2016   BMI 42.85 kg/m   GEN: Morbidly obese 46 yo in no acute distress  HEENT: normal  Neck: no JVD, carotid bruits, Neck full Cardiac: RRR; no murmurs, rubs, or gallops,Tr edema  Respiratory:  Upper airway "pseudowheeze"  No wheezing in lower airways  No rales   GI: soft, nontender, nondistended, + BS  No hepatomegaly  MS: no  deformity Moving all extremities   Skin: warm and dry, no rash Neuro:  Strength and sensation are intact Psych: euthymic mood, full affect   EKG:  EKG is ordered today.  SR 66 bnp      Lipid Panel    Component Value Date/Time   CHOL 163 12/15/2014 1051   TRIG 192.0 (H) 12/15/2014 1051   HDL 45.90 12/15/2014 1051   CHOLHDL 4 12/15/2014 1051   VLDL 38.4 12/15/2014 1051   LDLCALC 79 12/15/2014 1051      Wt Readings from Last 3 Encounters:  01/30/17 205 lb (93 kg)  12/19/16 201 lb (91.2 kg)  12/19/16 202 lb 6.4 oz (91.8 kg)      ASSESSMENT AND PLAN:  1  Chest tightness  The pt has signif upper airway tightness  ? Due to reflux   On acid inhibitor  Would consider bid   I am not convinced above represents angina.  Had dobutamine strss test in past  Pt has appt with pulmonary this afternoon Will set up for BMET and BNP Change ASA to 81 mg  2  Pulm  Appt this PM  3   Diastolic dysfunciton  Labs as noted above  Exam difficult    5  HL Pt on lipitor     Current medicines are reviewed at length with the patient today.  The patient does not have concerns regarding medicines.  Signed, Dorris Carnes, MD  01/30/2017 12:04 PM    [place] [place], [place],   [address] Phone: [phone]; Fax: [phone]

## 2017-01-30 ENCOUNTER — Encounter: Payer: Self-pay | Admitting: Adult Health

## 2017-01-30 ENCOUNTER — Ambulatory Visit (INDEPENDENT_AMBULATORY_CARE_PROVIDER_SITE_OTHER): Payer: Medicaid Other | Admitting: Internal Medicine

## 2017-01-30 ENCOUNTER — Encounter: Payer: Self-pay | Admitting: Internal Medicine

## 2017-01-30 ENCOUNTER — Ambulatory Visit (INDEPENDENT_AMBULATORY_CARE_PROVIDER_SITE_OTHER): Payer: Medicaid Other | Admitting: Adult Health

## 2017-01-30 VITALS — BP 134/76 | HR 66 | Ht <= 58 in | Wt 205.0 lb

## 2017-01-30 DIAGNOSIS — R0789 Other chest pain: Secondary | ICD-10-CM | POA: Diagnosis not present

## 2017-01-30 DIAGNOSIS — I519 Heart disease, unspecified: Secondary | ICD-10-CM | POA: Diagnosis not present

## 2017-01-30 DIAGNOSIS — I5189 Other ill-defined heart diseases: Secondary | ICD-10-CM

## 2017-01-30 DIAGNOSIS — J45998 Other asthma: Secondary | ICD-10-CM

## 2017-01-30 DIAGNOSIS — R0600 Dyspnea, unspecified: Secondary | ICD-10-CM

## 2017-01-30 MED ORDER — ASPIRIN EC 81 MG PO TBEC: 81.0000 mg | DELAYED_RELEASE_TABLET | Freq: Every day | ORAL | 3 refills | Status: DC

## 2017-01-30 NOTE — Patient Instructions (Addendum)
Continue on Dulera 2 puffs Twice daily  .rinse after use.  Decrease Prednisone 2mg  daily .  Continue on Zyrtec and Singulair daily .  Follow up with Dr. Lake Bells in 2 months and As needed   Please contact office for sooner follow up if symptoms do not improve or worsen or seek emergency care   Continuar con Dulera 2 [place]. [place] despus del uso. Disminuya la prednisona 2 mg al da. Contina en Zyrtec y [employer]. Haga un seguimiento con el Dr. Lake Bells en 2 meses y segn sea necesario Comunquese con la oficina para un seguimiento ms temprano si los sntomas no mejoran o empeoran o buscan atencin de Freight forward[employer].

## 2017-01-30 NOTE — Assessment & Plan Note (Signed)
Difficult to determine if Asthma vs VCD .  Appears to more VCD/Psuedowheezing .  She has persistent doe/fatigue ? From obesity /decondionting, chronic steroids  Continue slow steroid taper - may need to check endocrinology -if fatigue continues  ? Adrenal insufficiency with chronic steroids for years.   Plan Patient Instructions  Continue on Dulera 2 puffs Twice daily  .rinse after use.  Decrease Prednisone 2mg  daily .  Continue on Zyrtec and Singulair daily .  Follow up with Dr. Lake Bells in 2 months and As needed   Please contact office for sooner follow up if symptoms do not improve or worsen or seek emergency care   Continuar con Dulera 2 [place]. [place] despus del uso. Disminuya la prednisona 2 mg al da. Contina en Zyrtec y [employer]. Haga un seguimiento con el Dr. Lake Bells en 2 meses y segn sea necesario Comunquese con la oficina para un seguimiento ms temprano si los sntomas no mejoran o empeoran o buscan atencin de Freight forwarder.

## 2017-01-30 NOTE — Patient Instructions (Signed)
Your physician has recommended you make the following change in your medication:  1.) change aspirin 325 mg to 81 mg once a day  Your physician recommends that you return for lab work today (BMET, CBC)

## 2017-01-30 NOTE — Progress Notes (Signed)
@Patient  ID: Raven Turner, female    DOB: 04/10/1971, 46 y.o.   MRN: [id number]  Chief Complaint  Patient presents with  . Follow-up    Asthma     Referring provider: Boykin Nearing, MD  HPI: 46 yo  hispanic female from [place] moved to [place] permanently summer 2015 p pulmonary doctor in [place] said she had "refractory asthma" related to the environment but with a pattern much worse since 2010/ steroid dep .  Onset was childhood but continued to have "good days and bad days" until 2010 and no good days since then even p pred started and no better since arrived in [place] summer 2015 with no evidence of any airflow obst by pfts 11/03/14   TEST  11/03/14  PFTs p am dulera 100 on 30 mg prednisone  FEV1 1.56(73%) ratio 85 =  No obst, including fef 25-75%, only abn is reduction in ERV DLCO nml  spirometry 03/31/15 1.48 (62%) ratio 82  p am dulera and while symptomatic requesting saba July 2017 IgE 55 (normal), CBC without eosinophils Echo 2016 Grade 2 DD    01/30/2017 Follow up ; Asthma/VCD/Psudowheezing (Visit done w/ portable interpretor)  Pt returns for 1 month follow up for Asthma. Last ov , Dulera was increased . She has been on chronic steroids for years and has been on a very slow taper . Currently on prednisone 72m daily.  She is feeling okay with no flare of cough or wheezing  Says she has low energy and wears out easily .  Continues to gain weight . She is following with endocrinology . Was seen this am .  Gets wheezing in throat at times. Taking singulair and zyrtec .   Has Diastolic CHF followed with cardiology. Seen by this am , labs pending .    Allergies  Allergen Reactions  . Shellfish Allergy Anaphylaxis  . Septra [Sulfamethoxazole-Trimethoprim] Rash, Other (See Comments) and Itching    Facial redness with pruritus  Facial redness with pruritus   . Hydrocortisone Rash    Immunization History  Administered Date(s) Administered  . Hepatitis B 03/23/2015, 07/06/2015  .  Influenza Split 05/25/2015  . Influenza,inj,Quad PF,36+ Mos 05/13/2016  . Pneumococcal-Unspecified 04/12/2012  . Tdap 06/24/2015    Past Medical History:  Diagnosis Date  . Anemia   . Asthma   . Bilateral hand numbness 06/25/2015  . Bone pain 02/27/2015   Diffuse bone pain in legs mostly but also in arms    . Breast pain, left 03/27/2015  . Callus of heel 12/16/2014  . Compression fracture   . Compression fracture of L1 lumbar vertebra (HCC) 08/26/2014  . Compression fracture of T12 vertebra ([facility] 08/28/2014  . COPD (chronic obstructive pulmonary disease) ([facility] 2013  . Cushing syndrome ([facility] 2013   . Depression   . Diabetes ([facility] 2013   . Diabetes mellitus type 2, uncontrolled ([facility] 08/26/2014   Sees Dr. GKaty Fitch  Last visit 11/27/14.  No diabetic retinopathy    . Diabetes mellitus with neuropathy ([facility]   . Diastolic dysfunction without heart failure 10/14/2014   Grade 2 diastolic dysfunction 2/2 pulm HTN   . Generalized anxiety disorder 03/27/2015  . HTN (hypertension) 2005   . Hx of cataract surgery 11/03/2014  . LUQ abdominal pain 07/17/2015  . Medication management 05/12/2015  . Morbid obesity ([facility] 09/08/2014  . 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 Non-English speaking patient 09/08/2014  . OSA (obstructive sleep apnea) 05/20/2015  . Osteopenia 11/19/2013   Dx with osteopenia  in lumbar vertebra with partial compression of L1  . Pain due to onychomycosis of toenail 03/27/2015  . Secondary Cushing's syndrome/ iatrogenic   . Skin rash 07/17/2015  . Tachycardia 01/13/2015  . Upper airway cough syndrome 09/05/2014   Followed in Pulmonary clinic/ [facility]/ [place]  - Trial off advair      09/05/2014 >>>  - trial off theoph 09/17/2014 and added flutter  - rec reduce dulera to 100 2 bid 07/13/2015     . Vision changes 06/25/2015  . Vitamin D deficiency 08/28/2014    Tobacco History: History  Smoking Status  . Never Smoker  Smokeless Tobacco  . Never Used   Counseling given: Not  Answered   Outpatient Encounter Prescriptions as of 01/30/2017  Medication Sig  . acetaminophen-codeine (TYLENOL #3) 300-30 MG tablet TAKE 1 TO 2 TABLETS BY MOUTH AT BEDTIME AS NEEDED FOR MODERATE PAIN  . Albiglutide (TANZEUM) 50 MG PEN Inject 50 mg into the skin once a week.  Marland Kitchen albuterol (PROVENTIL HFA;VENTOLIN HFA) 108 (90 Base) MCG/ACT inhaler Inhale 2 puffs into the lungs every 4 (four) hours as needed for wheezing or shortness of breath (((PLAN B))).  Marland Kitchen albuterol (PROVENTIL) (2.5 MG/3ML) 0.083% nebulizer solution USE 1 VIAL VIA NEBULIZER EVERY 4 HOURS AS NEEDED FOR WHEEZING OR SHORTNESS OF BREATH  . aspirin EC 81 MG tablet Take 1 tablet (81 mg total) by mouth daily.  Marland Kitchen atorvastatin (LIPITOR) 20 MG tablet TAKE 1 TABLET BY MOUTH EVERY DAY  . Betamethasone Valerate 0.12 % foam Apply 1 application topically daily. To scalp  . Blood Glucose Monitoring Suppl (ACCU-CHEK AVIVA PLUS) W/DEVICE KIT Used as directed  . buprenorphine (BUTRANS - DOSED MCG/HR) 5 MCG/HR PTWK patch Place 5 mcg onto the skin once a week.  . cholecalciferol (VITAMIN D) 1000 UNITS tablet Take 1,000 Units by mouth every morning.  . clonazePAM (KLONOPIN) 0.5 MG tablet TAKE 1 TABLET BY MOUTH TWICE DAILY AS NEEDED FOR ANXIETY OR SLEEP  . cloNIDine (CATAPRES) 0.2 MG tablet Take 1 tablet (0.2 mg total) by mouth 3 (three) times daily.  . clotrimazole-betamethasone (LOTRISONE) cream Apply to affected area 2 times daily prn  . dextromethorphan-guaiFENesin (MUCINEX DM) 30-600 MG 12hr tablet Take 1 tablet by mouth 2 (two) times daily as needed (with flutter valve for cough and congestion). Reported on 04/01/2016  . ferrous sulfate 325 (65 FE) MG tablet Take 1 tablet (325 mg total) by mouth daily with breakfast.  . fluticasone (CUTIVATE) 0.05 % cream Apply topically 2 (two) times daily.  Marland Kitchen FOLIC ACID PO Take 1 tablet by mouth every morning.  . furosemide (LASIX) 40 MG tablet Take 1 tablet (40 mg total) by mouth every morning.  .  gabapentin (NEURONTIN) 400 MG capsule TAKE 2 CAPSULES BY MOUTH THREE TIMES DAILY  . glucose blood (RELION GLUCOSE TEST STRIPS) test strip 1 each by Other route 4 (four) times daily. ICD code E11.65  . HUMULIN R U-500 KWIKPEN 500 UNIT/ML kwikpen Inject 120-180 Units into the skin 3 (three) times daily. 180 U before breakfast, 140 U before lunch, 120 U before dinner  . Insulin Pen Needle (B-D ULTRAFINE III SHORT PEN) 31G X 8 MM MISC 1 application by Does not apply route daily.  . Insulin Syringe-Needle U-100 (BD INSULIN SYRINGE ULTRAFINE) 31G X 15/64" 1 ML MISC 1 each by Does not apply route 3 (three) times daily.  Elmore Guise Devices (ACCU-CHEK SOFTCLIX) lancets Use as instructed  . losartan (COZAAR) 100 MG tablet Take 1  tablet (100 mg total) by mouth every morning.  . metFORMIN (GLUCOPHAGE) 1000 MG tablet TAKE 1 TABLET BY MOUTH TWICE DAILY WITH A MEAL  . methocarbamol (ROBAXIN) 750 MG tablet Take 1 tablet (750 mg total) by mouth every 8 (eight) hours as needed for muscle spasms.  . methylphenidate (RITALIN LA) 10 MG 24 hr capsule Take 10 mg by mouth daily.  . metoCLOPramide (REGLAN) 10 MG tablet Take 1 tablet (10 mg total) by mouth every 6 (six) hours.  . Misc. Devices (CANE) MISC 1 each by Does not apply route daily.  . mometasone-formoterol (DULERA) 200-5 MCG/ACT AERO Inhale 2 puffs into the lungs 2 (two) times daily.  . montelukast (SINGULAIR) 10 MG tablet TAKE 1 TABLET BY MOUTH EVERY NIGHT AT BEDTIME  . mupirocin ointment (BACTROBAN) 2 % Place 1 application into the nose 2 (two) times daily.  Marland Kitchen omeprazole (PRILOSEC) 20 MG capsule Take 1 capsule (20 mg total) by mouth daily before breakfast.  . [place] Calcium 500 MG TABS Take 1 tablet (1,250 mg total) by mouth daily.  Marland Kitchen PARoxetine (PAXIL) 20 MG tablet TAKE 1 TABLET BY MOUTH EVERY DAY  . predniSONE (DELTASONE) 1 MG tablet TAKE 4 TABLETS BY MOUTH ONCE DAILY WITH BREAKFAST  . ranitidine (ZANTAC) 300 MG tablet TAKE 1 TABLET BY MOUTH EVERY NIGHT  AT BEDTIME  . ReliOn Ultra Thin Lancets MISC 1 each by Does not apply route 4 (four) times daily. ICD code E11.65  . Respiratory Therapy Supplies (FLUTTER) DEVI Use as directed  . Spacer/Aero-Holding Chambers (AEROCHAMBER MV) inhaler Use as instructed  . ULTICARE MICRO PEN NEEDLES 32G X 4 MM MISC USE AS DIRECTED  . verapamil (CALAN-SR) 180 MG CR tablet Take 2 tablets (360 mg total) by mouth at bedtime.   Facility-Administered Encounter Medications as of 01/30/2017  Medication  . betamethasone acetate-betamethasone sodium phosphate (CELESTONE) injection 3 mg  . DOBUTamine (DOBUTREX) 1,000 mcg/mL in dextrose 5% 250 mL infusion     Review of Systems  Constitutional:   No  weight loss, night sweats,  Fevers, chills,  +fatigue, or  lassitude.  HEENT:   No headaches,  Difficulty swallowing,  Tooth/dental problems, or  Sore throat,                No sneezing, itching, ear ache, nasal congestion, post nasal drip,   CV:  No chest pain,  Orthopnea, PND, swelling in lower extremities, anasarca, dizziness, palpitations, syncope.   GI  No heartburn, indigestion, abdominal pain, nausea, vomiting, diarrhea, change in bowel habits, loss of appetite, bloody stools.   Resp:    No excess mucus, no productive cough,  No non-productive cough,  No coughing up of blood.  No change in color of mucus.  No wheezing.  No chest wall deformity  Skin: no rash or lesions.  GU: no dysuria, change in color of urine, no urgency or frequency.  No flank pain, no hematuria   MS:  No joint pain or swelling.  No decreased range of motion.  No back pain.    Physical Exam  BP 124/82 (BP Location: Left Arm, Cuff Size: Large)   Pulse 77   Ht 4' 10"  (1.473 m)   Wt 205 lb 3.2 oz (93.1 kg)   LMP 11/07/2016   SpO2 97%   BMI 42.89 kg/m   GEN: A/Ox3; pleasant , NAD, obese    HEENT:  [place]/AT,  EACs-clear, TMs-wnl, NOSE-clear, THROAT-clear, no lesions, no postnasal drip or exudate noted. Class 2-3 MP airway  NECK:   Supple w/ fair ROM; no JVD; normal carotid impulses w/o bruits; no thyromegaly or nodules palpated; no lymphadenopathy.    RESP  Clear  P & A; w/o, wheezes/ rales/ or rhonchi. no accessory muscle use, no dullness to percussion +psuedowheezing on expiration   CARD:  RRR, no m/r/g, no peripheral edema, pulses intact, no cyanosis or clubbing.  GI:   Soft & nt; nml bowel sounds; no organomegaly or masses detected.   Musco: Warm bil, no deformities or joint swelling noted.   Neuro: alert, no focal deficits noted.    Skin: Warm, no lesions or rashes    Imaging: Dg Cervical Spine 2 Or 3 Views  Result Date: 01/09/2017 CLINICAL DATA:  Trauma 6 months ago.  Mid lower posterior neck pain. EXAM: CERVICAL SPINE - 2-3 VIEW COMPARISON:  None. FINDINGS: The lateral view images through the bottom of C6. Soft tissues partially obscure C5-6 level. Prevertebral soft tissues are within normal limits. Maintenance of vertebral body height and alignment. Facets are well-aligned. Lateral masses and odontoid process intact on the open-mouth view. No abnormality at the C7-T1 level on swimmer's view. Intervertebral disc heights are maintained. IMPRESSION: No acute osseous abnormality. Electronically Signed   By: Abigail Miyamoto M.D.   On: 01/09/2017 15:54     Assessment & Plan:   Asthma ? severity vs VCD/ pseudo asthma Difficult to determine if Asthma vs VCD .  Appears to more VCD/Psuedowheezing .  She has persistent doe/fatigue ? From obesity /decondionting, chronic steroids  Continue slow steroid taper - may need to check endocrinology -if fatigue continues  ? Adrenal insufficiency with chronic steroids for years.   Plan Patient Instructions  Continue on Dulera 2 puffs Twice daily  .rinse after use.  Decrease Prednisone 29m daily .  Continue on Zyrtec and Singulair daily .  Follow up with Dr. MLake Bellsin 2 months and As needed   Please contact office for sooner follow up if symptoms do not improve or worsen  or seek emergency care   Continuar con Dulera 2 [place] despus del uso. Disminuya la prednisona 2 mg al da. Contina en Zyrtec y [employer] Haga un seguimiento con el Dr. MLake Bellsen 2 meses y segn sea necesario [place]un[place]ese con la oficina para un seguimiento ms temprano si los sntomas no mejoran o empeoran o buscan atencin de eFreight forwarder        TRexene Edison NP 01/30/2017

## 2017-01-31 ENCOUNTER — Other Ambulatory Visit: Payer: Self-pay | Admitting: Family Medicine

## 2017-01-31 DIAGNOSIS — J45998 Other asthma: Secondary | ICD-10-CM

## 2017-01-31 DIAGNOSIS — I10 Essential (primary) hypertension: Secondary | ICD-10-CM

## 2017-01-31 LAB — BASIC METABOLIC PANEL
BUN/Creatinine Ratio: 16 (ref 9–23)
BUN: 13 mg/dL (ref 6–24)
CO2: 25 mmol/L (ref 18–29)
Calcium: 9.6 mg/dL (ref 8.7–10.2)
Chloride: 99 mmol/L (ref 96–106)
Creatinine, Ser: 0.81 mg/dL (ref 0.57–1.00)
GFR calc Af Amer: 102 mL/min/{1.73_m2} (ref >59)
GFR calc non Af Amer: 89 mL/min/{1.73_m2} (ref >59)
Glucose: 181 mg/dL — ABNORMAL HIGH (ref 65–99)
Potassium: 4.3 mmol/L (ref 3.5–5.2)
Sodium: 140 mmol/L (ref 134–144)

## 2017-01-31 LAB — CBC
Hematocrit: 33 % — ABNORMAL LOW (ref 34.0–46.6)
Hemoglobin: 10.7 g/dL — ABNORMAL LOW (ref 11.1–15.9)
MCH: 24.3 pg — ABNORMAL LOW (ref 26.6–33.0)
MCHC: 32.4 g/dL (ref 31.5–35.7)
MCV: 75 fL — ABNORMAL LOW (ref 79–97)
Platelets: 446 10*3/uL — ABNORMAL HIGH (ref 150–379)
RBC: 4.4 x10E6/uL (ref 3.77–5.28)
RDW: 15.8 % — ABNORMAL HIGH (ref 12.3–15.4)
WBC: 10.3 10*3/uL (ref 3.4–10.8)

## 2017-01-31 NOTE — Progress Notes (Signed)
Reviso, estoy de acuerdo

## 2017-02-02 ENCOUNTER — Telehealth: Payer: Self-pay | Admitting: Family Medicine

## 2017-02-02 DIAGNOSIS — E229 Hyperfunction of pituitary gland, unspecified: Principal | ICD-10-CM

## 2017-02-02 DIAGNOSIS — R7989 Other specified abnormal findings of blood chemistry: Secondary | ICD-10-CM

## 2017-02-02 NOTE — Telephone Encounter (Signed)
Called pt. To schedule a lab appt. LVM to return call and schedule appt.

## 2017-02-02 NOTE — Telephone Encounter (Signed)
Please ask patient to return for repeat prolactin level Lab Ordered This is to get approval for MR head

## 2017-02-02 NOTE — Telephone Encounter (Signed)
Pt was called by amy and appointment was set for pt to get lab drawn.

## 2017-02-08 ENCOUNTER — Ambulatory Visit: Payer: Medicaid Other | Attending: Family Medicine

## 2017-02-08 DIAGNOSIS — E229 Hyperfunction of pituitary gland, unspecified: Secondary | ICD-10-CM | POA: Insufficient documentation

## 2017-02-08 DIAGNOSIS — R7989 Other specified abnormal findings of blood chemistry: Secondary | ICD-10-CM

## 2017-02-08 NOTE — Progress Notes (Signed)
Patient here for lab visit only 

## 2017-02-09 LAB — PROLACTIN: Prolactin: 212.5 ng/mL — ABNORMAL HIGH (ref 4.8–23.3)

## 2017-02-14 ENCOUNTER — Other Ambulatory Visit: Payer: Self-pay | Admitting: Family Medicine

## 2017-02-14 DIAGNOSIS — J45998 Other asthma: Secondary | ICD-10-CM

## 2017-02-15 ENCOUNTER — Telehealth: Payer: Self-pay

## 2017-02-15 NOTE — Telephone Encounter (Signed)
Pt was called and informed of lab results. [phone]

## 2017-02-20 ENCOUNTER — Encounter: Payer: Self-pay | Admitting: Family Medicine

## 2017-02-20 ENCOUNTER — Ambulatory Visit: Payer: Medicaid Other | Attending: Family Medicine | Admitting: Family Medicine

## 2017-02-20 VITALS — BP 122/74 | HR 84 | Temp 98.3°F | Ht <= 58 in | Wt 201.2 lb

## 2017-02-20 DIAGNOSIS — M818 Other osteoporosis without current pathological fracture: Secondary | ICD-10-CM | POA: Insufficient documentation

## 2017-02-20 DIAGNOSIS — IMO0001 Reserved for inherently not codable concepts without codable children: Secondary | ICD-10-CM

## 2017-02-20 DIAGNOSIS — Z7982 Long term (current) use of aspirin: Secondary | ICD-10-CM | POA: Insufficient documentation

## 2017-02-20 DIAGNOSIS — E229 Hyperfunction of pituitary gland, unspecified: Secondary | ICD-10-CM

## 2017-02-20 DIAGNOSIS — Z6841 Body Mass Index (BMI) 40.0 and over, adult: Secondary | ICD-10-CM | POA: Diagnosis not present

## 2017-02-20 DIAGNOSIS — E1165 Type 2 diabetes mellitus with hyperglycemia: Secondary | ICD-10-CM

## 2017-02-20 DIAGNOSIS — I10 Essential (primary) hypertension: Secondary | ICD-10-CM | POA: Diagnosis not present

## 2017-02-20 DIAGNOSIS — L989 Disorder of the skin and subcutaneous tissue, unspecified: Secondary | ICD-10-CM

## 2017-02-20 DIAGNOSIS — Z794 Long term (current) use of insulin: Secondary | ICD-10-CM | POA: Diagnosis not present

## 2017-02-20 DIAGNOSIS — R21 Rash and other nonspecific skin eruption: Secondary | ICD-10-CM | POA: Diagnosis not present

## 2017-02-20 DIAGNOSIS — R234 Changes in skin texture: Secondary | ICD-10-CM

## 2017-02-20 DIAGNOSIS — E119 Type 2 diabetes mellitus without complications: Secondary | ICD-10-CM | POA: Insufficient documentation

## 2017-02-20 DIAGNOSIS — T380X5A Adverse effect of glucocorticoids and synthetic analogues, initial encounter: Secondary | ICD-10-CM | POA: Diagnosis not present

## 2017-02-20 DIAGNOSIS — R7989 Other specified abnormal findings of blood chemistry: Secondary | ICD-10-CM

## 2017-02-20 DIAGNOSIS — L298 Other pruritus: Secondary | ICD-10-CM

## 2017-02-20 DIAGNOSIS — E249 Cushing's syndrome, unspecified: Secondary | ICD-10-CM | POA: Insufficient documentation

## 2017-02-20 DIAGNOSIS — Z7952 Long term (current) use of systemic steroids: Secondary | ICD-10-CM | POA: Diagnosis not present

## 2017-02-20 DIAGNOSIS — Z79899 Other long term (current) drug therapy: Secondary | ICD-10-CM | POA: Diagnosis not present

## 2017-02-20 DIAGNOSIS — N926 Irregular menstruation, unspecified: Secondary | ICD-10-CM | POA: Diagnosis not present

## 2017-02-20 DIAGNOSIS — Z7951 Long term (current) use of inhaled steroids: Secondary | ICD-10-CM | POA: Insufficient documentation

## 2017-02-20 LAB — GLUCOSE, POCT (MANUAL RESULT ENTRY): POC Glucose: 268 mg/dl — AB (ref 70–99)

## 2017-02-20 LAB — POCT GLYCOSYLATED HEMOGLOBIN (HGB A1C): Hemoglobin A1C: 7.7

## 2017-02-20 MED ORDER — BETAMETHASONE VALERATE 0.1 % EX OINT: 1.0000 "application " | TOPICAL_OINTMENT | Freq: Two times a day (BID) | CUTANEOUS | 0 refills | Status: DC

## 2017-02-20 MED ORDER — HUMULIN R U-500 KWIKPEN 500 UNIT/ML ~~LOC~~ SOPN: 120.0000 [IU] | PEN_INJECTOR | Freq: Three times a day (TID) | SUBCUTANEOUS | 11 refills | Status: DC

## 2017-02-20 NOTE — Patient Instructions (Addendum)
Icis was seen today for diabetes.  Diagnoses and all orders for this visit:  Uncontrolled type 2 diabetes mellitus without complication, with long-term current use of insulin (HCC) -     POCT glucose (manual entry) -     POCT glycosylated hemoglobin (Hb A1C) -     HUMULIN R U-500 KWIKPEN 500 UNIT/ML kwikpen; Inject 120-180 Units into the skin 3 (three) times daily. 180 U before breakfast, 160 U before lunch, 140 U before dinner  Elevated prolactin level (HCC) -     MR Brain W Wo Contrast; Future  Flaking of scalp  Pruritic erythematous rash -     betamethasone valerate ointment (VALISONE) 0.1 %; Apply 1 application topically 2 (two) times daily. To R lower leg rash  Steroid-induced osteoporosis  your reclast injection will be set up  F/u in 4 weeks to recheck R leg rash and f/u MRI of brain  Dr. Adrian Blackwater

## 2017-02-20 NOTE — Assessment & Plan Note (Signed)
Well controlled Med: compliant Continue current regimen

## 2017-02-20 NOTE — Assessment & Plan Note (Signed)
A: improving Med: compliant P: Increase U 500 lunch and dinner doses

## 2017-02-20 NOTE — Assessment & Plan Note (Signed)
Post menopausal

## 2017-02-20 NOTE — Assessment & Plan Note (Signed)
Yearly reclast infusion set up

## 2017-02-20 NOTE — Assessment & Plan Note (Signed)
A: rash on R anterior leg. Reported vesicles are improving.  DDx: allergic dermatitis or fungal rash P: Topical betamethasone

## 2017-02-20 NOTE — Progress Notes (Signed)
Subjective:  Patient ID: Raven Turner, female    DOB: 09-10-1971  Age: 46 y.o. 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 CHRONIC DIABETES  Disease Monitoring  Blood Sugar Ranges:   Fasting 115-120  Postprandial 250-280, she reports sugars are higher after lunch and dinner    Polyuria: no   Visual problems: no   Medication Compliance: yes, still gaining weight despite tanzeum.  Medication Side Effects  Hypoglycemia: no   Preventitive Health Care  Eye Exam: due   Foot Exam: done recently    2. HTN: compliant with regimen. No swelling. No HA, CP or SOB.   3. R leg redness: x 3 days. started with redness then with itching,. There were also clear fluid filled bilsters.  Burning sensation. No fever or chills. No injury. Now new skin products.   4. Elevated prolactin level: incidental finding. Patient denies nipple discharge. She denies peripheral vision loss and headaches.   Social History  Substance Use Topics  . Smoking status: Never Smoker  . Smokeless tobacco: Never Used  . Alcohol use No   Outpatient Medications Prior to Visit  Medication Sig Dispense Refill  . acetaminophen-codeine (TYLENOL #3) 300-30 MG tablet TAKE 1 TO 2 TABLETS BY MOUTH AT BEDTIME AS NEEDED FOR MODERATE PAIN 60 tablet 2  . Albiglutide (TANZEUM) 50 MG PEN Inject 50 mg into the skin once a week. 4 each 3  . albuterol (PROVENTIL HFA;VENTOLIN HFA) 108 (90 Base) MCG/ACT inhaler Inhale 2 puffs into the lungs every 4 (four) hours as needed for wheezing or shortness of breath (((PLAN B))).    Marland Kitchen albuterol (PROVENTIL) (2.5 MG/3ML) 0.083% nebulizer solution USE 1 VIAL VIA NEBULIZER EVERY 4 HOURS AS NEEDED FOR WHEEZING  OR SHORTNESS OF BREATH 90 mL 0  . aspirin EC 81 MG tablet Take 1 tablet (81 mg total) by mouth daily. 90 tablet 3  . atorvastatin (LIPITOR) 20 MG tablet TAKE 1 TABLET BY MOUTH EVERY DAY 90 tablet 0  . Betamethasone Valerate 0.12 % foam Apply 1 application topically daily. To scalp 100 g 0  . Blood Glucose Monitoring Suppl (ACCU-CHEK AVIVA PLUS) W/DEVICE KIT Used as directed 1 kit 0  . buprenorphine (BUTRANS - DOSED MCG/HR) 5 MCG/HR PTWK patch Place 5 mcg onto the skin once a week.    . cholecalciferol (VITAMIN D) 1000 UNITS tablet Take 1,000 Units by mouth every morning.    . clonazePAM (KLONOPIN) 0.5 MG tablet TAKE 1 TABLET BY MOUTH TWICE DAILY AS NEEDED FOR ANXIETY OR SLEEP 60 tablet 5  . cloNIDine (CATAPRES) 0.2 MG tablet TAKE 1 TABLET BY MOUTH THREE TIMES DAILY 90 tablet 0  . clotrimazole-betamethasone (LOTRISONE) cream Apply to affected area 2 times daily prn 45 g 0  . dextromethorphan-guaiFENesin (MUCINEX DM) 30-600 MG 12hr tablet Take 1 tablet by mouth 2 (two) times daily as needed (with flutter valve for cough and congestion). Reported on 04/01/2016    . ferrous sulfate 325 (65 FE) MG tablet Take 1 tablet (325 mg total) by mouth daily with breakfast. 30 tablet 3  . fluticasone (CUTIVATE) 0.05 % cream Apply topically 2 (two) times daily. 30 g 0  . FOLIC ACID PO Take 1 tablet by mouth every morning.    Marland Kitchen  furosemide (LASIX) 40 MG tablet Take 1 tablet (40 mg total) by mouth every morning. 30 tablet 2  . gabapentin (NEURONTIN) 400 MG capsule TAKE 2 CAPSULES BY MOUTH THREE TIMES DAILY 180 capsule 3  . glucose blood (RELION GLUCOSE TEST STRIPS) test strip 1 each by Other route 4 (four) times daily. ICD code E11.65 400 each 3  . HUMULIN R U-500 KWIKPEN 500 UNIT/ML kwikpen Inject 120-180 Units into the skin 3 (three) times daily. 180 U before breakfast, 140 U before lunch, 120 U before dinner 27 mL 11  . Insulin Pen Needle (B-D ULTRAFINE III SHORT PEN) 31G X 8 MM MISC 1 application by Does not apply  route daily. 100 each 3  . Insulin Syringe-Needle U-100 (BD INSULIN SYRINGE ULTRAFINE) 31G X 15/64" 1 ML MISC 1 each by Does not apply route 3 (three) times daily. 300 each 3  . Lancet Devices (ACCU-CHEK SOFTCLIX) lancets Use as instructed 1 each 0  . losartan (COZAAR) 100 MG tablet Take 1 tablet (100 mg total) by mouth every morning. 90 tablet 3  . metFORMIN (GLUCOPHAGE) 1000 MG tablet TAKE 1 TABLET BY MOUTH TWICE DAILY WITH A MEAL 60 tablet 2  . methocarbamol (ROBAXIN) 750 MG tablet Take 1 tablet (750 mg total) by mouth every 8 (eight) hours as needed for muscle spasms. 90 tablet 11  . methylphenidate (RITALIN LA) 10 MG 24 hr capsule Take 10 mg by mouth daily.    . metoCLOPramide (REGLAN) 10 MG tablet Take 1 tablet (10 mg total) by mouth every 6 (six) hours. 30 tablet 3  . Misc. Devices (CANE) MISC 1 each by Does not apply route daily. 1 each 0  . mometasone-formoterol (DULERA) 200-5 MCG/ACT AERO Inhale 2 puffs into the lungs 2 (two) times daily. 1 Inhaler 5  . montelukast (SINGULAIR) 10 MG tablet TAKE 1 TABLET BY MOUTH EVERY NIGHT AT BEDTIME 30 tablet 3  . mupirocin ointment (BACTROBAN) 2 % Place 1 application into the nose 2 (two) times daily. 22 g 0  . omeprazole (PRILOSEC) 20 MG capsule TAKE 1 CAPSULE BY MOUTH EVERY DAY BEFORE BREAKFAST 30 capsule 0  . [place] Calcium 500 MG TABS Take 1 tablet (1,250 mg total) by mouth daily. 90 tablet 3  . PARoxetine (PAXIL) 20 MG tablet TAKE 1 TABLET BY MOUTH EVERY DAY 30 tablet 3  . predniSONE (DELTASONE) 1 MG tablet TAKE 4 TABLETS BY MOUTH ONCE DAILY WITH BREAKFAST 120 tablet 0  . ranitidine (ZANTAC) 300 MG tablet TAKE 1 TABLET BY MOUTH EVERY NIGHT AT BEDTIME 30 tablet 0  . ReliOn Ultra Thin Lancets MISC 1 each by Does not apply route 4 (four) times daily. ICD code E11.65 400 each 3  . Respiratory Therapy Supplies (FLUTTER) DEVI Use as directed 1 each 0  . Spacer/Aero-Holding Chambers (AEROCHAMBER MV) inhaler Use as instructed 1 each 0  . ULTICARE  MICRO PEN NEEDLES 32G X 4 MM MISC USE AS DIRECTED 100 each 2  . verapamil (CALAN-SR) 180 MG CR tablet Take 2 tablets (360 mg total) by mouth at bedtime. 180 tablet 3   Facility-Administered Medications Prior to Visit  Medication Dose Route Frequency Provider Last Rate Last Dose  . betamethasone acetate-betamethasone sodium phosphate (CELESTONE) injection 3 mg  3 mg Intramuscular Once Gala Lewandowsky M, DPM      . DOBUTamine (DOBUTREX) 1,000 mcg/mL in dextrose 5% 250 mL infusion  30 mcg/kg/min Intravenous Continuous Croitoru, Mihai, MD 145.3 mL/hr at 04/22/15 1300 30 mcg/kg/min at 04/22/15 1300  ROS Review of Systems  Constitutional: Negative for chills and fever.  HENT: Negative for congestion.   Eyes: Negative for visual disturbance.  Respiratory: Negative for cough and shortness of breath.   Cardiovascular: Negative for chest pain.  Gastrointestinal: Negative for abdominal distention, abdominal pain, anal bleeding, blood in stool, constipation, diarrhea, nausea, rectal pain and vomiting.  Musculoskeletal: Positive for arthralgias, back pain and myalgias.  Skin: Positive for rash. Negative for wound.  Allergic/Immunologic: Negative for immunocompromised state.  Hematological: Negative for adenopathy. Does not bruise/bleed easily.  Psychiatric/Behavioral: Negative for dysphoric mood and suicidal ideas.    Objective:  BP 122/74   Pulse 84   Temp 98.3 F (36.8 C) (Oral)   Wt 201 lb 3.2 oz (91.3 kg)   LMP 11/07/2016   SpO2 97%   BMI 42.05 kg/m    BP/Weight 02/20/2017 01/30/2017 4/53/6468  Systolic BP 032 122 482  Diastolic BP 74 82 76  Wt. (Lbs) 201.2 205.2 205  BMI 42.05 42.89 42.85   BP Readings from Last 3 Encounters:  02/20/17 122/74  01/30/17 124/82  01/30/17 134/76   Wt Readings from Last 3 Encounters:  02/20/17 201 lb 3.2 oz (91.3 kg)  01/30/17 205 lb 3.2 oz (93.1 kg)  01/30/17 205 lb (93 kg)    Physical Exam  Constitutional: She is oriented to person, place, and  time. She appears well-developed and well-nourished. No distress.  Obese   HENT:  Head: Normocephalic and atraumatic.  Cardiovascular: Normal rate, regular rhythm, normal heart sounds and intact distal pulses.   Pulmonary/Chest: Effort normal. She has no wheezes.  Abdominal:  Obese   Musculoskeletal: She exhibits no edema.  Neurological: She is alert and oriented to person, place, and time.  Skin: Skin is warm and dry. No rash noted.     Psychiatric: She has a normal mood and affect.   Lab Results  Component Value Date   HGBA1C 8.3 12/19/2016    Lab Results  Component Value Date   HGBA1C 7.7 02/20/2017   Lab Results  Component Value Date   TSH 4.250 12/19/2016    CBG 268 .prola  Assessment & Plan:   George was seen today for diabetes.  Diagnoses and all orders for this visit:  Uncontrolled type 2 diabetes mellitus without complication, with long-term current use of insulin (HCC) -     POCT glucose (manual entry) -     POCT glycosylated hemoglobin (Hb A1C) -     HUMULIN R U-500 KWIKPEN 500 UNIT/ML kwikpen; Inject 120-180 Units into the skin 3 (three) times daily. 180 U before breakfast, 160 U before lunch, 140 U before dinner  Elevated prolactin level (HCC) -     MR Brain W Wo Contrast; Future  Flaking of scalp  Pruritic erythematous rash -     betamethasone valerate ointment (VALISONE) 0.1 %; Apply 1 application topically 2 (two) times daily. To R lower leg rash  Steroid-induced osteoporosis   No orders of the defined types were placed in this encounter.   Follow-up: Return in about 4 weeks (around 03/20/2017) for leg rash/review MRI .   Boykin Nearing MD

## 2017-02-20 NOTE — Assessment & Plan Note (Signed)
Elevated prolactin level x 2 MRI brain with and without contrast ordered

## 2017-03-01 ENCOUNTER — Ambulatory Visit (HOSPITAL_COMMUNITY): Payer: Medicaid Other

## 2017-03-04 ENCOUNTER — Other Ambulatory Visit: Payer: Self-pay | Admitting: Family Medicine

## 2017-03-04 DIAGNOSIS — J45998 Other asthma: Secondary | ICD-10-CM

## 2017-03-06 ENCOUNTER — Ambulatory Visit (HOSPITAL_COMMUNITY)
Admission: RE | Admit: 2017-03-06 | Discharge: 2017-03-06 | Disposition: A | Discharge status: Home / Self Care | Payer: Medicaid Other | Source: Ambulatory Visit | Attending: Family Medicine | Admitting: Family Medicine

## 2017-03-06 DIAGNOSIS — R9089 Other abnormal findings on diagnostic imaging of central nervous system: Secondary | ICD-10-CM | POA: Diagnosis not present

## 2017-03-06 DIAGNOSIS — E237 Disorder of pituitary gland, unspecified: Secondary | ICD-10-CM | POA: Diagnosis not present

## 2017-03-06 DIAGNOSIS — R7989 Other specified abnormal findings of blood chemistry: Secondary | ICD-10-CM

## 2017-03-06 DIAGNOSIS — E221 Hyperprolactinemia: Secondary | ICD-10-CM | POA: Diagnosis present

## 2017-03-06 DIAGNOSIS — E229 Hyperfunction of pituitary gland, unspecified: Secondary | ICD-10-CM

## 2017-03-06 MED ORDER — GADOBENATE DIMEGLUMINE 529 MG/ML IV SOLN
15.0000 mL | Freq: Once | INTRAVENOUS | Status: AC
  Administered 2017-03-06: 15 mL via INTRAVENOUS

## 2017-03-10 ENCOUNTER — Telehealth: Payer: Self-pay | Admitting: Family Medicine

## 2017-03-10 DIAGNOSIS — I10 Essential (primary) hypertension: Secondary | ICD-10-CM

## 2017-03-10 MED ORDER — FUROSEMIDE 40 MG PO TABS: 40.0000 mg | ORAL_TABLET | Freq: Every morning | ORAL | 2 refills | Status: DC

## 2017-03-10 MED ORDER — CLONIDINE HCL 0.2 MG PO TABS: 0.2000 mg | ORAL_TABLET | Freq: Three times a day (TID) | ORAL | 2 refills | Status: DC

## 2017-03-10 NOTE — Telephone Encounter (Signed)
Pt calling to request furosemide  and clonidine. Please f/u with pt. Thank you.

## 2017-03-10 NOTE — Telephone Encounter (Signed)
Requested medications refilled 

## 2017-03-13 ENCOUNTER — Other Ambulatory Visit: Payer: Self-pay | Admitting: Family Medicine

## 2017-03-13 DIAGNOSIS — D352 Benign neoplasm of pituitary gland: Secondary | ICD-10-CM | POA: Insufficient documentation

## 2017-03-13 NOTE — Assessment & Plan Note (Signed)
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 Buddy Duty) for treatment which usually includes medications and trending the prolactin levels

## 2017-03-16 ENCOUNTER — Telehealth: Payer: Self-pay

## 2017-03-16 NOTE — Telephone Encounter (Signed)
Pt was called and informed of lab results.[phone])

## 2017-03-17 ENCOUNTER — Other Ambulatory Visit: Payer: Self-pay | Admitting: Family Medicine

## 2017-03-17 DIAGNOSIS — J45998 Other asthma: Secondary | ICD-10-CM

## 2017-03-27 ENCOUNTER — Other Ambulatory Visit: Payer: Self-pay | Admitting: Family Medicine

## 2017-03-27 ENCOUNTER — Ambulatory Visit: Payer: Medicaid Other | Attending: Family Medicine | Admitting: Family Medicine

## 2017-03-27 ENCOUNTER — Encounter: Payer: Self-pay | Admitting: Family Medicine

## 2017-03-27 VITALS — BP 134/79 | HR 73 | Temp 98.4°F | Resp 18 | Ht <= 58 in | Wt 199.8 lb

## 2017-03-27 DIAGNOSIS — Z794 Long term (current) use of insulin: Secondary | ICD-10-CM | POA: Insufficient documentation

## 2017-03-27 DIAGNOSIS — R51 Headache: Secondary | ICD-10-CM | POA: Insufficient documentation

## 2017-03-27 DIAGNOSIS — E1165 Type 2 diabetes mellitus with hyperglycemia: Secondary | ICD-10-CM | POA: Insufficient documentation

## 2017-03-27 DIAGNOSIS — T380X5A Adverse effect of glucocorticoids and synthetic analogues, initial encounter: Secondary | ICD-10-CM | POA: Diagnosis not present

## 2017-03-27 DIAGNOSIS — E1129 Type 2 diabetes mellitus with other diabetic kidney complication: Secondary | ICD-10-CM

## 2017-03-27 DIAGNOSIS — Z79899 Other long term (current) drug therapy: Secondary | ICD-10-CM | POA: Diagnosis not present

## 2017-03-27 DIAGNOSIS — G8929 Other chronic pain: Secondary | ICD-10-CM

## 2017-03-27 DIAGNOSIS — Z7982 Long term (current) use of aspirin: Secondary | ICD-10-CM | POA: Insufficient documentation

## 2017-03-27 DIAGNOSIS — M545 Low back pain, unspecified: Secondary | ICD-10-CM

## 2017-03-27 DIAGNOSIS — R809 Proteinuria, unspecified: Secondary | ICD-10-CM | POA: Diagnosis not present

## 2017-03-27 DIAGNOSIS — R808 Other proteinuria: Secondary | ICD-10-CM | POA: Diagnosis not present

## 2017-03-27 DIAGNOSIS — I10 Essential (primary) hypertension: Secondary | ICD-10-CM | POA: Insufficient documentation

## 2017-03-27 DIAGNOSIS — IMO0001 Reserved for inherently not codable concepts without codable children: Secondary | ICD-10-CM

## 2017-03-27 DIAGNOSIS — K219 Gastro-esophageal reflux disease without esophagitis: Secondary | ICD-10-CM | POA: Diagnosis not present

## 2017-03-27 DIAGNOSIS — D352 Benign neoplasm of pituitary gland: Secondary | ICD-10-CM | POA: Diagnosis not present

## 2017-03-27 DIAGNOSIS — M549 Dorsalgia, unspecified: Principal | ICD-10-CM

## 2017-03-27 DIAGNOSIS — M818 Other osteoporosis without current pathological fracture: Secondary | ICD-10-CM | POA: Diagnosis not present

## 2017-03-27 LAB — GLUCOSE, POCT (MANUAL RESULT ENTRY): POC Glucose: 275 mg/dL — AB (ref 70–99)

## 2017-03-27 LAB — POCT UA - MICROALBUMIN
Albumin/Creatinine Ratio, Urine, POC: >300
Creatinine, POC: 200 mg/dL
Microalbumin Ur, POC: 150 mg/L

## 2017-03-27 MED ORDER — GABAPENTIN 400 MG PO CAPS: 800.0000 mg | ORAL_CAPSULE | Freq: Three times a day (TID) | ORAL | 3 refills | Status: DC

## 2017-03-27 MED ORDER — FUROSEMIDE 20 MG PO TABS: 20.0000 mg | ORAL_TABLET | Freq: Every morning | ORAL | 2 refills | Status: DC

## 2017-03-27 NOTE — Assessment & Plan Note (Signed)
Refilled lasix 20 mg daily Patient reports she has not taken lasix in 2 months

## 2017-03-27 NOTE — Progress Notes (Signed)
Patient has eaten  Patient has taken her medications Patient took 180 units about 1 hour and a half prior to visit  Patient says the back pain is normal for her since her fall 2 years ago  MA contacted short stay to schedule patients fusion. MA requested a call back to the office as well as informing the patient of the scheduled appt.

## 2017-03-27 NOTE — Assessment & Plan Note (Signed)
Last reclast infusion 7.14.17 Labs ordered today Will schedule another reclast infusion

## 2017-03-27 NOTE — Patient Instructions (Addendum)
Raven Turner was seen today for follow-up.  Diagnoses and all orders for this visit:  Uncontrolled type 2 diabetes mellitus without complication, with long-term current use of insulin (HCC) -     Glucose (CBG) -     POCT UA - Microalbumin  Essential hypertension -     furosemide (LASIX) 20 MG tablet; Take 1 tablet (20 mg total) by mouth every morning.  Chronic midline low back pain without sciatica -     gabapentin (NEURONTIN) 400 MG capsule; Take 2 capsules (800 mg total) by mouth 3 (three) times daily.  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 Adrian Blackwater

## 2017-03-27 NOTE — Progress Notes (Signed)
Subjective:  Patient ID: Raven Turner, female    DOB: [birth year]  Age: 46 y.o. 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 Elevated prolactin level: checked initially to evaluate her complaint of menstrual changes at 12/19/2016 visit..  Increased level on 2 checks. Follow up MRI of brain revealed pituitary microadenoma. She reports increased headache over the past month but denies vision changes. She does admit to work regarding MRI findings. She denies nipple discharge. She has a follow up appointment with her Endocrinologist,  Dr. Buddy Duty on78/20/2018.   2 . R leg redness: clear fluid filled blisters with redness and itching. Resolved with topical betamethasone.   3. Bubbly urine: this worries her. She has diabetes and HTN. No CKD. No dysuria.    Social History  Substance Use Topics  . Smoking status: Never Smoker  . Smokeless tobacco: Never Used  . Alcohol use No   Outpatient Medications Prior to Visit  Medication Sig Dispense Refill  . acetaminophen-codeine (TYLENOL #3) 300-30 MG tablet TAKE 1 TO 2 TABLETS BY MOUTH AT BEDTIME AS NEEDED FOR MODERATE PAIN 60 tablet 2  . Albiglutide (TANZEUM) 50 MG PEN Inject 50 mg into the skin once a week. 4 each 3  . albuterol (PROVENTIL HFA;VENTOLIN HFA) 108 (90 Base) MCG/ACT inhaler Inhale 2 puffs into the lungs every 4 (four) hours as needed for wheezing or shortness of breath (((PLAN B))).    Marland Kitchen albuterol (PROVENTIL) (2.5 MG/3ML) 0.083% nebulizer solution USE 1 VIAL VIA NEBULIZER EVERY 4 HOURS AS NEEDED FOR WHEEZING OR SHORTNESS OF BREATH 90 mL 0  . aspirin EC 81 MG tablet Take 1 tablet (81 mg total) by mouth daily. 90 tablet 3  . atorvastatin (LIPITOR) 20 MG tablet TAKE 1 TABLET BY MOUTH EVERY DAY 90 tablet 0  .  Betamethasone Valerate 0.12 % foam Apply 1 application topically daily. To scalp 100 g 0  . betamethasone valerate ointment (VALISONE) 0.1 % Apply 1 application topically 2 (two) times daily. To R lower leg rash 45 g 0  . Blood Glucose Monitoring Suppl (ACCU-CHEK AVIVA PLUS) W/DEVICE KIT Used as directed 1 kit 0  . buprenorphine (BUTRANS - DOSED MCG/HR) 5 MCG/HR PTWK patch Place 5 mcg onto the skin once a week.    . cholecalciferol (VITAMIN D) 1000 UNITS tablet Take 1,000 Units by mouth every morning.    . clonazePAM (KLONOPIN) 0.5 MG tablet TAKE 1 TABLET BY MOUTH TWICE DAILY AS NEEDED FOR ANXIETY OR SLEEP 60 tablet 5  . cloNIDine (CATAPRES) 0.2 MG tablet Take 1 tablet (0.2 mg total) by mouth 3 (three) times daily. 90 tablet 2  . clotrimazole-betamethasone (LOTRISONE) cream Apply to affected area 2 times daily prn 45 g 0  . dextromethorphan-guaiFENesin (MUCINEX DM) 30-600 MG 12hr tablet Take 1 tablet by mouth 2 (two) times daily as needed (with flutter valve for cough and congestion). Reported on 04/01/2016    . ferrous sulfate 325 (65 FE) MG tablet Take 1 tablet (325 mg total) by mouth daily with breakfast. 30 tablet 3  . fluticasone (CUTIVATE) 0.05 % cream Apply topically 2 (two) times daily. 30 g 0  . FOLIC ACID PO Take 1 tablet by mouth every morning.    . furosemide (LASIX) 40 MG tablet Take 1 tablet (40 mg total) by  mouth every morning. 30 tablet 2  . gabapentin (NEURONTIN) 400 MG capsule TAKE 2 CAPSULES BY MOUTH THREE TIMES DAILY 180 capsule 3  . glucose blood (RELION GLUCOSE TEST STRIPS) test strip 1 each by Other route 4 (four) times daily. ICD code E11.65 400 each 3  . HUMULIN R U-500 KWIKPEN 500 UNIT/ML kwikpen Inject 120-180 Units into the skin 3 (three) times daily. 180 U before breakfast, 160 U before lunch, 140 U before dinner 27 mL 11  . Insulin Pen Needle (B-D ULTRAFINE III SHORT PEN) 31G X 8 MM MISC 1 application by Does not apply route daily. 100 each 3  . Insulin Syringe-Needle  U-100 (BD INSULIN SYRINGE ULTRAFINE) 31G X 15/64" 1 ML MISC 1 each by Does not apply route 3 (three) times daily. 300 each 3  . Lancet Devices (ACCU-CHEK SOFTCLIX) lancets Use as instructed 1 each 0  . levothyroxine (SYNTHROID, LEVOTHROID) 25 MCG tablet Take 25 mcg by mouth daily.  3  . losartan (COZAAR) 100 MG tablet Take 1 tablet (100 mg total) by mouth every morning. 90 tablet 3  . metFORMIN (GLUCOPHAGE) 1000 MG tablet TAKE 1 TABLET BY MOUTH TWICE DAILY WITH A MEAL 60 tablet 2  . methocarbamol (ROBAXIN) 750 MG tablet Take 1 tablet (750 mg total) by mouth every 8 (eight) hours as needed for muscle spasms. 90 tablet 11  . methylphenidate (RITALIN LA) 10 MG 24 hr capsule Take 10 mg by mouth daily.    . metoCLOPramide (REGLAN) 5 MG tablet Take 5 mg by mouth 4 (four) times daily -  with meals and at bedtime.  5  . Misc. Devices (CANE) MISC 1 each by Does not apply route daily. 1 each 0  . mometasone-formoterol (DULERA) 200-5 MCG/ACT AERO Inhale 2 puffs into the lungs 2 (two) times daily. 1 Inhaler 5  . montelukast (SINGULAIR) 10 MG tablet TAKE 1 TABLET BY MOUTH EVERY NIGHT AT BEDTIME 30 tablet 3  . mupirocin ointment (BACTROBAN) 2 % Place 1 application into the nose 2 (two) times daily. 22 g 0  . omeprazole (PRILOSEC) 20 MG capsule TAKE ONE CAPSULE BY MOUTH EVERY DAY BEFORE BREAKFAST 30 capsule 0  . Oyster Shell Calcium 500 MG TABS Take 1 tablet (1,250 mg total) by mouth daily. 90 tablet 3  . PARoxetine (PAXIL) 20 MG tablet TAKE 1 TABLET BY MOUTH EVERY DAY 30 tablet 3  . predniSONE (DELTASONE) 1 MG tablet Take 3 tablets (3 mg total) by mouth daily with breakfast. 90 tablet 5  . ranitidine (ZANTAC) 300 MG tablet TAKE 1 TABLET BY MOUTH EVERY NIGHT AT BEDTIME 30 tablet 5  . ReliOn Ultra Thin Lancets MISC 1 each by Does not apply route 4 (four) times daily. ICD code E11.65 400 each 3  . Respiratory Therapy Supplies (FLUTTER) DEVI Use as directed 1 each 0  . Spacer/Aero-Holding Chambers (AEROCHAMBER MV)  inhaler Use as instructed 1 each 0  . ULTICARE MICRO PEN NEEDLES 32G X 4 MM MISC USE AS DIRECTED 100 each 2  . verapamil (CALAN-SR) 180 MG CR tablet Take 2 tablets (360 mg total) by mouth at bedtime. 180 tablet 3   Facility-Administered Medications Prior to Visit  Medication Dose Route Frequency Provider Last Rate Last Dose  . betamethasone acetate-betamethasone sodium phosphate (CELESTONE) injection 3 mg  3 mg Intramuscular Once Daylene Katayama M, DPM      . DOBUTamine (DOBUTREX) 1,000 mcg/mL in dextrose 5% 250 mL infusion  30 mcg/kg/min Intravenous Continuous Croitoru, Mihai, MD 145.3  mL/hr at 04/22/15 1300 30 mcg/kg/min at 04/22/15 1300    ROS Review of Systems  Constitutional: Negative for chills and fever.  HENT: Negative for congestion.   Eyes: Negative for visual disturbance.  Respiratory: Negative for cough and shortness of breath.   Cardiovascular: Negative for chest pain.  Gastrointestinal: Negative for abdominal distention, abdominal pain, anal bleeding, blood in stool, constipation, diarrhea, nausea, rectal pain and vomiting.  Musculoskeletal: Positive for arthralgias, back pain and myalgias.  Skin: Positive for rash. Negative for wound.  Allergic/Immunologic: Negative for immunocompromised state.  Neurological: Positive for headaches.  Hematological: Negative for adenopathy. Does not bruise/bleed easily.  Psychiatric/Behavioral: Negative for dysphoric mood and suicidal ideas.    Objective:  BP 134/79 (BP Location: Right Arm, Patient Position: Sitting, Cuff Size: Large)   Pulse 73   Temp 98.4 F (36.9 C) (Oral)   Resp 18   Ht '4\' 10"'$  (1.473 m)   Wt 199 lb 12.8 oz (90.6 kg)   LMP 03/03/2017 (Exact Date)   SpO2 95%   BMI 41.76 kg/m   BP/Weight 03/27/2017 02/20/2017 03/14/5008  Systolic BP 381 829 937  Diastolic BP 79 74 82  Wt. (Lbs) 199.8 201.2 205.2  BMI 41.76 42.05 42.89   BP Readings from Last 3 Encounters:  02/20/17 122/74  01/30/17 124/82  01/30/17 134/76    Wt Readings from Last 3 Encounters:  02/20/17 201 lb 3.2 oz (91.3 kg)  01/30/17 205 lb 3.2 oz (93.1 kg)  01/30/17 205 lb (93 kg)    Physical Exam  Constitutional: She is oriented to person, place, and time. She appears well-developed and well-nourished. No distress.  Obese   HENT:  Head: Normocephalic and atraumatic.  Cardiovascular: Normal rate, regular rhythm, normal heart sounds and intact distal pulses.   Pulmonary/Chest: Effort normal. She has no wheezes.  Abdominal:  Obese   Musculoskeletal: She exhibits no edema.  Neurological: She is alert and oriented to person, place, and time.  Skin: Skin is warm and dry. No rash noted.  Psychiatric: She has a normal mood and affect.     Lab Results  Component Value Date   HGBA1C 7.7 02/20/2017   Lab Results  Component Value Date   TSH 4.250 12/19/2016    CBG 268 microalbumin level of 150    Assessment & Plan:   [place] was seen today for follow-up.  Diagnoses and all orders for this visit:  Uncontrolled type 2 diabetes mellitus without complication, with long-term current use of insulin (HCC) -     Glucose (CBG) -     POCT UA - Microalbumin  Essential hypertension -     furosemide (LASIX) 20 MG tablet; Take 1 tablet (20 mg total) by mouth every morning.  Chronic midline low back pain without sciatica -     gabapentin (NEURONTIN) 400 MG capsule; Take 2 capsules (800 mg total) by mouth 3 (three) times daily.  Microalbuminuria due to type 2 diabetes mellitus (HCC)  Pituitary microadenoma (HCC)  Essential (primary) hypertension  Steroid-induced osteoporosis -     CMP and Liver   No orders of the defined types were placed in this encounter.   Follow-up: Return in about 2 months (around 05/28/2017) for meet PCP.   Boykin Nearing MD

## 2017-03-27 NOTE — Assessment & Plan Note (Signed)
Reviewed MRI findings with patient She has endocrinology follow up scheduled

## 2017-03-27 NOTE — Assessment & Plan Note (Addendum)
A: patient with microalbuminuria P: Control CBGs and BP Continue losartan Recent renal ultrasound with normal kidneys

## 2017-03-28 LAB — CMP AND LIVER
ALT: 75 IU/L — ABNORMAL HIGH (ref 0–32)
AST: 69 IU/L — ABNORMAL HIGH (ref 0–40)
Albumin: 3.9 g/dL (ref 3.5–5.5)
Alkaline Phosphatase: 158 IU/L — ABNORMAL HIGH (ref 39–117)
BUN: 12 mg/dL (ref 6–24)
Bilirubin Total: <0.2 mg/dL (ref 0.0–1.2)
Bilirubin, Direct: 0.07 mg/dL (ref 0.00–0.40)
CO2: 24 mmol/L (ref 20–29)
Calcium: 9.6 mg/dL (ref 8.7–10.2)
Chloride: 98 mmol/L (ref 96–106)
Creatinine, Ser: 0.8 mg/dL (ref 0.57–1.00)
GFR calc Af Amer: 104 mL/min/{1.73_m2} (ref >59)
GFR calc non Af Amer: 90 mL/min/{1.73_m2} (ref >59)
Glucose: 210 mg/dL — ABNORMAL HIGH (ref 65–99)
Potassium: 4.5 mmol/L (ref 3.5–5.2)
Sodium: 140 mmol/L (ref 134–144)
Total Protein: 7.3 g/dL (ref 6.0–8.5)

## 2017-04-05 ENCOUNTER — Telehealth: Payer: Self-pay

## 2017-04-05 NOTE — Telephone Encounter (Signed)
Pt was called and informed of lab results. Appointment has been set for 04/17/17@10 :00 am.

## 2017-04-07 ENCOUNTER — Other Ambulatory Visit: Payer: Self-pay | Admitting: Family Medicine

## 2017-04-07 DIAGNOSIS — F411 Generalized anxiety disorder: Secondary | ICD-10-CM

## 2017-04-07 DIAGNOSIS — J45998 Other asthma: Secondary | ICD-10-CM

## 2017-04-10 ENCOUNTER — Ambulatory Visit (INDEPENDENT_AMBULATORY_CARE_PROVIDER_SITE_OTHER): Payer: Medicaid Other | Admitting: Pulmonary Disease

## 2017-04-10 ENCOUNTER — Encounter: Payer: Self-pay | Admitting: Pulmonary Disease

## 2017-04-10 VITALS — BP 142/76 | HR 66 | Ht <= 58 in | Wt 196.0 lb

## 2017-04-10 DIAGNOSIS — R05 Cough: Secondary | ICD-10-CM

## 2017-04-10 DIAGNOSIS — J45998 Other asthma: Secondary | ICD-10-CM | POA: Diagnosis not present

## 2017-04-10 DIAGNOSIS — R058 Other specified cough: Secondary | ICD-10-CM

## 2017-04-10 DIAGNOSIS — T380X5A Adverse effect of glucocorticoids and synthetic analogues, initial encounter: Secondary | ICD-10-CM | POA: Diagnosis not present

## 2017-04-10 DIAGNOSIS — M818 Other osteoporosis without current pathological fracture: Secondary | ICD-10-CM | POA: Diagnosis not present

## 2017-04-10 MED ORDER — PREDNISONE 1 MG PO TABS: 1.0000 mg | ORAL_TABLET | Freq: Every day | ORAL | 2 refills | Status: DC

## 2017-04-10 NOTE — Progress Notes (Signed)
Subjective:    Patient ID: Raven Turner, female    DOB: 1970-12-09, 46 y.o.   MRN: [id number]  Synopsis: Former patient of Dr. Melvyn Novas with recurrent wheeze, shortness of breath and cough felt to be due to either vocal cord dysfunction or asthma.   [facility]uated by [facility] in 2017, no paradoxical movements of the vocal cords noted   HPI Chief Complaint  Patient presents with  . Follow-up    pt c/o some sob with exertion.     She comes to clinic today telling me that she's not been having any trouble breathing. However, approximately 2-3 weeks ago she had some soreness in her throat and a very red throat. She was seen by her primary care physician. This is associated with a cough and some mucus production. She had some intermittent shortness of breath associated with this. However, she has been decreasing the dose of prednisone and she's now on 2 mg daily and has not had any increasing chest tightness, wheezing, or shortness of breath. She's continuing to use Dulera 2 puffs twice a day.  Past Medical History:  Diagnosis Date  . Anemia   . Asthma   . Bilateral hand numbness 06/25/2015  . Bone pain 02/27/2015   Diffuse bone pain in legs mostly but also in arms    . Breast pain, left 03/27/2015  . Callus of heel 12/16/2014  . Compression fracture   . Compression fracture of L1 lumbar vertebra (HCC) 08/26/2014  . Compression fracture of T12 vertebra ([place]) 08/28/2014  . COPD (chronic obstructive pulmonary disease) ([place]) 2013  . Cushing syndrome ([place]) 2013   . Depression   . Diabetes ([place]) 2013   . Diabetes mellitus type 2, uncontrolled ([place]) 08/26/2014   Sees Dr. Katy Fitch.  Last visit 11/27/14.  No diabetic retinopathy    . Diabetes mellitus with neuropathy ([place])   . Diastolic dysfunction without heart failure 10/14/2014   Grade 2 diastolic dysfunction 2/2 pulm HTN   . Generalized anxiety disorder 03/27/2015  . HTN (hypertension) 2005   . Hx of cataract surgery  11/03/2014  . LUQ abdominal pain 07/17/2015  . Medication management 05/12/2015  . Morbid obesity ([place]) 09/08/2014  . 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 Non-English speaking patient 09/08/2014  . OSA (obstructive sleep apnea) 05/20/2015  . Osteopenia 11/19/2013   Dx with osteopenia in lumbar vertebra with partial compression of L1  . Pain due to onychomycosis of toenail 03/27/2015  . Secondary Cushing's syndrome/ iatrogenic   . Skin rash 07/17/2015  . Tachycardia 01/13/2015  . Upper airway cough syndrome 09/05/2014   Followed in Pulmonary clinic/ [facility]/ [place]  - Trial off advair      09/05/2014 >>>  - trial off theoph 09/17/2014 and added flutter  - rec reduce dulera to 100 2 bid 07/13/2015     . Vision changes 06/25/2015  . Vitamin D deficiency 08/28/2014        Review of Systems  Constitutional: Negative for chills, fatigue and fever.  HENT: Negative for postnasal drip, rhinorrhea and sinus pressure.   Respiratory: Negative for cough, shortness of breath and wheezing.   Cardiovascular: Negative for chest pain, palpitations and leg swelling.       Objective:   Physical Exam Vitals:   04/10/17 1434  BP: (!) 142/76  Pulse: 66  SpO2: 97%  Weight: 196 lb (88.9 kg)  Height: 4' 10"  (1.473 m)  RA  Gen: obese but well appearing HENT: OP clear,  TM's clear, neck supple PULM: Upper airway wheeze only B, normal percussion CV: RRR, no mgr, trace edema GI: BS+, soft, nontender Derm: no cyanosis or rash Psyche: normal mood and affect    PFT 11/03/14  PFTs p am dulera 100 on 30 mg prednisone  FEV1 1.56(73%) ratio 85 =  No obst, including fef 25-75%, only abn is reduction in ERV spirometry 03/31/15 1.48 (62%) ratio 82  p am dulera and while symptomatic requesting saba  July 2017 IgE 55 (normal), CBC without eosinophils  Primary care records reviewed were she was cared for for routine health maintenance and was noted to have a pituitary microadenoma on MRI of the brain.       Assessment & Plan:  Asthma ? severity vs VCD/ pseudo asthma  Morbid obesity ([place])  Steroid-induced osteoporosis  Upper airway cough syndrome  Discussion: She has signs and symptoms of poorly controlled asthma but on physical exam she really only has an upper airway wheeze. As I have stated in multiple prior visits she only always ever has a wheeze which is coming from her vocal cords and not from her lung. As stated previously prednisone is contributing to her obesity and osteoporosis and is clearly causing more harm than good. We are slowly weaning her off and this seems to be going fairly well. We are going to continue to watch her very carefully and slowly wean off prednisone.  Plan: For your wheezing: Continue taking Dulera 2 puffs twice a Decrease prednisone to 1 mg daily Follow-up with a nurse practitioner in 6 weeks, if you are not having trouble with chest tightness, wheezing, or shortness of breath by that visit and we will have you take 1 mg every other day for 6 weeks and follow-up with me after that.  For your obesity: Continue to exercise and lose weight The following behaviors have been associated with weight loss: Weigh yourself daily Write down everything you eat Drink a glass of water prior to eating a meal  We will see you back in 6 weeks or sooner if needed    Current Outpatient Prescriptions:  .  acetaminophen-codeine (TYLENOL #3) 300-30 MG tablet, TAKE 1 TO 2 TABLETS BY MOUTH AT BEDTIME AS NEEDED FOR MODERATE PAIN, Disp: 60 tablet, Rfl: 2 .  Albiglutide (TANZEUM) 50 MG PEN, Inject 50 mg into the skin once a week., Disp: 4 each, Rfl: 3 .  albuterol (PROVENTIL HFA;VENTOLIN HFA) 108 (90 Base) MCG/ACT inhaler, Inhale 2 puffs into the lungs every 4 (four) hours as needed for wheezing or shortness of breath (((PLAN B)))., Disp: , Rfl:  .  albuterol (PROVENTIL) (2.5 MG/3ML) 0.083% nebulizer solution, USE 1 VIAL VIA NEBULIZER EVERY 4 HOURS AS NEEDED FOR WHEEZING OR  SHORTNESS OF BREATH, Disp: 90 mL, Rfl: 0 .  aspirin EC 81 MG tablet, Take 1 tablet (81 mg total) by mouth daily., Disp: 90 tablet, Rfl: 3 .  atorvastatin (LIPITOR) 20 MG tablet, TAKE 1 TABLET BY MOUTH EVERY DAY, Disp: 90 tablet, Rfl: 0 .  Betamethasone Valerate 0.12 % foam, Apply 1 application topically daily. To scalp, Disp: 100 g, Rfl: 0 .  betamethasone valerate ointment (VALISONE) 0.1 %, Apply 1 application topically 2 (two) times daily. To R lower leg rash, Disp: 45 g, Rfl: 0 .  Blood Glucose Monitoring Suppl (ACCU-CHEK AVIVA PLUS) W/DEVICE KIT, Used as directed, Disp: 1 kit, Rfl: 0 .  buprenorphine (BUTRANS - DOSED MCG/HR) 5 MCG/HR PTWK patch, Place 5 mcg onto the skin once a  week., Disp: , Rfl:  .  cholecalciferol (VITAMIN D) 1000 UNITS tablet, Take 1,000 Units by mouth every morning., Disp: , Rfl:  .  clonazePAM (KLONOPIN) 0.5 MG tablet, TAKE 1 TABLET BY MOUTH TWICE DAILY AS NEEDED FOR ANXIETY OR SLEEP, Disp: 60 tablet, Rfl: 5 .  cloNIDine (CATAPRES) 0.2 MG tablet, Take 1 tablet (0.2 mg total) by mouth 3 (three) times daily., Disp: 90 tablet, Rfl: 2 .  clotrimazole-betamethasone (LOTRISONE) cream, Apply to affected area 2 times daily prn, Disp: 45 g, Rfl: 0 .  dextromethorphan-guaiFENesin (MUCINEX DM) 30-600 MG 12hr tablet, Take 1 tablet by mouth 2 (two) times daily as needed (with flutter valve for cough and congestion). Reported on 04/01/2016, Disp: , Rfl:  .  ferrous sulfate 325 (65 FE) MG tablet, Take 1 tablet (325 mg total) by mouth daily with breakfast., Disp: 30 tablet, Rfl: 3 .  fluticasone (CUTIVATE) 0.05 % cream, Apply topically 2 (two) times daily., Disp: 30 g, Rfl: 0 .  FOLIC ACID PO, Take 1 tablet by mouth every morning., Disp: , Rfl:  .  furosemide (LASIX) 20 MG tablet, Take 1 tablet (20 mg total) by mouth every morning., Disp: 30 tablet, Rfl: 2 .  gabapentin (NEURONTIN) 400 MG capsule, Take 2 capsules (800 mg total) by mouth 3 (three) times daily., Disp: 180 capsule, Rfl: 3 .   glucose blood (RELION GLUCOSE TEST STRIPS) test strip, 1 each by Other route 4 (four) times daily. ICD code E11.65, Disp: 400 each, Rfl: 3 .  HUMULIN R U-500 KWIKPEN 500 UNIT/ML kwikpen, Inject 120-180 Units into the skin 3 (three) times daily. 180 U before breakfast, 160 U before lunch, 140 U before dinner, Disp: 27 mL, Rfl: 11 .  Insulin Pen Needle (B-D ULTRAFINE III SHORT PEN) 31G X 8 MM MISC, 1 application by Does not apply route daily., Disp: 100 each, Rfl: 3 .  Insulin Syringe-Needle U-100 (BD INSULIN SYRINGE ULTRAFINE) 31G X 15/64" 1 ML MISC, 1 each by Does not apply route 3 (three) times daily., Disp: 300 each, Rfl: 3 .  Lancet Devices (ACCU-CHEK SOFTCLIX) lancets, Use as instructed, Disp: 1 each, Rfl: 0 .  levothyroxine (SYNTHROID, LEVOTHROID) 25 MCG tablet, Take 25 mcg by mouth daily., Disp: , Rfl: 3 .  losartan (COZAAR) 100 MG tablet, Take 1 tablet (100 mg total) by mouth every morning., Disp: 90 tablet, Rfl: 3 .  metFORMIN (GLUCOPHAGE) 1000 MG tablet, TAKE 1 TABLET BY MOUTH TWICE DAILY WITH A MEAL, Disp: 60 tablet, Rfl: 2 .  methocarbamol (ROBAXIN) 750 MG tablet, TAKE ONE TABLET BY MOUTH EVERY EIGHT HOURS AS NEEDED FOR MUSCLE SPASMS, Disp: 90 tablet, Rfl: 0 .  methylphenidate (RITALIN LA) 10 MG 24 hr capsule, Take 10 mg by mouth daily., Disp: , Rfl:  .  metoCLOPramide (REGLAN) 5 MG tablet, Take 5 mg by mouth 4 (four) times daily -  with meals and at bedtime., Disp: , Rfl: 5 .  Misc. Devices (CANE) MISC, 1 each by Does not apply route daily., Disp: 1 each, Rfl: 0 .  mometasone-formoterol (DULERA) 200-5 MCG/ACT AERO, Inhale 2 puffs into the lungs 2 (two) times daily., Disp: 1 Inhaler, Rfl: 5 .  montelukast (SINGULAIR) 10 MG tablet, TAKE 1 TABLET BY MOUTH EVERY NIGHT AT BEDTIME, Disp: 30 tablet, Rfl: 3 .  mupirocin ointment (BACTROBAN) 2 %, Place 1 application into the nose 2 (two) times daily., Disp: 22 g, Rfl: 0 .  omeprazole (PRILOSEC) 20 MG capsule, TAKE ONE CAPSULE BY MOUTH EVERY DAY  BEFORE BREAKFAST, Disp: 90 capsule, Rfl: 0 .  Oyster Shell Calcium 500 MG TABS, Take 1 tablet (1,250 mg total) by mouth daily., Disp: 90 tablet, Rfl: 3 .  PARoxetine (PAXIL) 20 MG tablet, TAKE 1 TABLET BY MOUTH EVERY DAY, Disp: 90 tablet, Rfl: 0 .  predniSONE (DELTASONE) 1 MG tablet, Take 3 tablets (3 mg total) by mouth daily with breakfast., Disp: 90 tablet, Rfl: 5 .  ranitidine (ZANTAC) 300 MG tablet, TAKE 1 TABLET BY MOUTH EVERY NIGHT AT BEDTIME, Disp: 30 tablet, Rfl: 5 .  ReliOn Ultra Thin Lancets MISC, 1 each by Does not apply route 4 (four) times daily. ICD code E11.65, Disp: 400 each, Rfl: 3 .  Respiratory Therapy Supplies (FLUTTER) DEVI, Use as directed, Disp: 1 each, Rfl: 0 .  Spacer/Aero-Holding Chambers (AEROCHAMBER MV) inhaler, Use as instructed, Disp: 1 each, Rfl: 0 .  ULTICARE MICRO PEN NEEDLES 32G X 4 MM MISC, USE AS DIRECTED, Disp: 100 each, Rfl: 2 .  verapamil (CALAN-SR) 180 MG CR tablet, Take 2 tablets (360 mg total) by mouth at bedtime., Disp: 180 tablet, Rfl: 3  Current Facility-Administered Medications:  .  betamethasone acetate-betamethasone sodium phosphate (CELESTONE) injection 3 mg, 3 mg, Intramuscular, Once, Edrick Kins, DPM  Facility-Administered Medications Ordered in Other Visits:  .  DOBUTamine (DOBUTREX) 1,000 mcg/mL in dextrose 5% 250 mL infusion, 30 mcg/kg/min, Intravenous, Continuous, Croitoru, Mihai, MD, Last Rate: 145.3 mL/hr at 04/22/15 1300, 30 mcg/kg/min at 04/22/15 1300

## 2017-04-10 NOTE — Patient Instructions (Signed)
For your wheezing: Continue taking Dulera 2 puffs twice a Decrease prednisone to 1 mg daily Follow-up with a nurse practitioner in 6 weeks, if you are not having trouble with chest tightness, wheezing, or shortness of breath by that visit and we will have you take 1 mg every other day for 6 weeks and follow-up with me after that  For your obesity: Continue to exercise and lose weight The following behaviors have been associated with weight loss: Weigh yourself daily Write down everything you eat Drink a glass of water prior to eating a meal  We will see you back in 6 weeks or sooner if needed

## 2017-04-10 NOTE — Addendum Note (Signed)
Addended by: Len Blalock on: 04/10/2017 03:24 PM   Modules accepted: Orders

## 2017-04-14 ENCOUNTER — Other Ambulatory Visit (HOSPITAL_COMMUNITY): Payer: Self-pay | Admitting: *Deleted

## 2017-04-15 ENCOUNTER — Other Ambulatory Visit: Payer: Self-pay | Admitting: Family Medicine

## 2017-04-15 DIAGNOSIS — J45998 Other asthma: Secondary | ICD-10-CM

## 2017-04-17 ENCOUNTER — Ambulatory Visit (HOSPITAL_COMMUNITY)
Admission: RE | Admit: 2017-04-17 | Discharge: 2017-04-17 | Disposition: A | Discharge status: Home / Self Care | Payer: Medicaid Other | Source: Ambulatory Visit | Attending: Family Medicine | Admitting: Family Medicine

## 2017-04-17 DIAGNOSIS — M818 Other osteoporosis without current pathological fracture: Secondary | ICD-10-CM | POA: Diagnosis present

## 2017-04-17 MED ORDER — ZOLEDRONIC ACID 5 MG/100ML IV SOLN: 5.0000 mg | Freq: Once | INTRAVENOUS | Status: DC

## 2017-04-17 MED ORDER — ZOLEDRONIC ACID 5 MG/100ML IV SOLN
INTRAVENOUS | Status: AC
  Administered 2017-04-17: 5 mg
  Filled 2017-04-17: qty 100

## 2017-04-17 NOTE — Discharge Instructions (Signed)
Zoledronic Acid injection (Paget's Disease, Osteoporosis) °What is this medicine? °ZOLEDRONIC ACID (ZOE le dron ik AS id) lowers the amount of calcium loss from bone. It is used to treat Paget's disease and osteoporosis in women. °This medicine may be used for other purposes; ask your health care provider or pharmacist if you have questions. °COMMON BRAND NAME(S): Reclast, Zometa °What should I tell my health care provider before I take this medicine? °They need to know if you have any of these conditions: °-aspirin-sensitive asthma °-cancer, especially if you are receiving medicines used to treat cancer °-dental disease or wear dentures °-infection °-kidney disease °-low levels of calcium in the blood °-past surgery on the parathyroid gland or intestines °-receiving corticosteroids like dexamethasone or prednisone °-an unusual or allergic reaction to zoledronic acid, other medicines, foods, dyes, or preservatives °-pregnant or trying to get pregnant °-breast-feeding °How should I use this medicine? °This medicine is for infusion into a vein. It is given by a health care professional in a hospital or clinic setting. °Talk to your pediatrician regarding the use of this medicine in children. This medicine is not approved for use in children. °Overdosage: If you think you have taken too much of this medicine contact a poison control center or emergency room at once. °NOTE: This medicine is only for you. Do not share this medicine with others. °What if I miss a dose? °It is important not to miss your dose. Call your doctor or health care professional if you are unable to keep an appointment. °What may interact with this medicine? °-certain antibiotics given by injection °-NSAIDs, medicines for pain and inflammation, like ibuprofen or naproxen °-some diuretics like bumetanide, furosemide °-teriparatide °This list may not describe all possible interactions. Give your health care provider a list of all the medicines,  herbs, non-prescription drugs, or dietary supplements you use. Also tell them if you smoke, drink alcohol, or use illegal drugs. Some items may interact with your medicine. °What should I watch for while using this medicine? °Visit your doctor or health care professional for regular checkups. It may be some time before you see the benefit from this medicine. Do not stop taking your medicine unless your doctor tells you to. Your doctor may order blood tests or other tests to see how you are doing. °Women should inform their doctor if they wish to become pregnant or think they might be pregnant. There is a potential for serious side effects to an unborn child. Talk to your health care professional or pharmacist for more information. °You should make sure that you get enough calcium and vitamin D while you are taking this medicine. Discuss the foods you eat and the vitamins you take with your health care professional. °Some people who take this medicine have severe bone, joint, and/or muscle pain. This medicine may also increase your risk for jaw problems or a broken thigh bone. Tell your doctor right away if you have severe pain in your jaw, bones, joints, or muscles. Tell your doctor if you have any pain that does not go away or that gets worse. °Tell your dentist and dental surgeon that you are taking this medicine. You should not have major dental surgery while on this medicine. See your dentist to have a dental exam and fix any dental problems before starting this medicine. Take good care of your teeth while on this medicine. Make sure you see your dentist for regular follow-up appointments. °What side effects may I notice from receiving this medicine? °  Side effects that you should report to your doctor or health care professional as soon as possible: -allergic reactions like skin rash, itching or hives, swelling of the face, lips, or tongue -anxiety, confusion, or depression -breathing problems -changes in  vision -eye pain -feeling faint or lightheaded, falls -jaw pain, especially after dental work -mouth sores -muscle cramps, stiffness, or weakness -redness, blistering, peeling or loosening of the skin, including inside the mouth -trouble passing urine or change in the amount of urine Side effects that usually do not require medical attention (report to your doctor or health care professional if they continue or are bothersome): -bone, joint, or muscle pain -constipation -diarrhea -fever -hair loss -irritation at site where injected -loss of appetite -nausea, vomiting -stomach upset -trouble sleeping -trouble swallowing -weak or tired This list may not describe all possible side effects. Call your doctor for medical advice about side effects. You may report side effects to FDA at 1-800-FDA-1088. Where should I keep my medicine? This drug is given in a hospital or clinic and will not be stored at home. NOTE: This sheet is a summary. It may not cover all possible information. If you have questions about this medicine, talk to your doctor, pharmacist, or health care provider.  2018 Elsevier/Gold Standard (2014-01-25 14:19:57) Zoledronic Acid injection (Paget's Disease, Osteoporosis) Qu es este medicamento? El CIDO ZOLEDRNICO reduce la prdida del calcio de los [place]. Se utiliza para tratar la enfermedad de Paget y osteoporosis en mujeres. Este medicamento puede ser utilizado para otros usos; si tiene alguna pregunta consulte con su proveedor de atencin mdica o con su farmacutico. MARCAS COMUNES: Reclast, Zometa Qu le debo informar a mi profesional de la salud antes de tomar este medicamento? Necesita saber si usted presenta alguno de los siguientes problemas o situaciones: -asma sensible a la aspirina -cncer, especialmente si est recibiendo medicamentos que se usan para tratar el cncer -enfermedad dental o [place] dentadura postiza -infeccin -enfermedad renal -niveles  bajos de calcio en la sangre -ciruga previa de la glndula paratiroidea o intestinales -est recibiendo corticoesteroides como dexametasona o prednisona -una reaccin alrgica o inusual al cido zoledrnico, a otros medicamentos, alimentos, colorantes o conservantes -si est embarazada o buscando quedar embarazada -si est amamantando a un beb Cmo debo utilizar este medicamento? Este medicamento se administra mediante infusin por va intravenosa. Lo administra un profesional de Technical sales engineer en un hospital o en un entorno clnico. Hable con su pediatra para informarse acerca del uso de este medicamento en nios. Este medicamento no est aprobado para uso en nios. Sobredosis: Pngase en contacto inmediatamente con un centro toxicolgico o una sala de urgencia si usted cree que haya tomado demasiado medicamento. ATENCIN: [employer] es solo para usted. No comparta este medicamento con nadie. Qu sucede si me olvido de una dosis? Es importante no olvidar ninguna dosis. Informe a su mdico o a su profesional de la salud si no puede asistir a Photographer. Qu puede interactuar con este medicamento? -ciertos antibiticos administrados por inyeccin -los AINE, medicamentos para Conservation officer, historic buildings o inflamacin, como ibuprofeno o naproxeno -ciertos diurticos bumetanida o furosemida -teriparatida Puede ser que esta lista no menciona todas las posibles interacciones. Informe a su profesional de [employer]	[place] de [employer] productos a base de hierbas, medicamentos de [place] o suplementos nutritivos que est tomando. Si usted fuma, consume bebidas alcohlicas o si utiliza drogas ilegales, indqueselo tambin a su profesional de [employer]	[place]. Algunas sustancias pueden interactuar con su medicamento. A qu debo estar atento  al usar [employer]? Visite a su mdico o a su profesional de la salud para chequeos peridicos. Puede ser necesario que transcurra cierto tiempo antes de que pueda observar los beneficios de  [place]. No deje de tomar su medicamento excepto si as lo indica su mdico. Su mdico puede pedirle anlisis de [place] u otras pruebas para Chief Technology Officer su evolucin. Las mujeres deben informar a su mdico si estn buscando quedar embarazadas o si creen que estn embarazadas. Existe la posibilidad de efectos secundarios graves a un beb sin nacer. Para ms informacin hable con su profesional de la salud o su farmacutico. Es importante que usted reciba la cantidad [place] de calcio y vitamina D mientras recibe este medicamento. Consulte a su profesional de [employer] alimentos y vitamina que toma. Algunas personas que toman este medicamento experimentan dolor grave de [place], de articulaciones o/y de msculos. Este medicamento tambin puede aumentar el riesgo de problemas de la [place] o un fmur roto. Informe a su mdico inmediatamente si tiene dolor de mandbula, articulaciones o msculos. Si experimenta dolor que no desaparece o que empeora, informe a su mdico. Informe a su dentista y [place] dental que est usando este medicamento. No debera tener Office manager que est [employer]. Visite a su dentista para someterse a un examen dental y arreglar cualquier problema dental antes de comenzar este medicamento. Cuide sus dientes mientras est [employer]. Asegrese de visitar a su dentista para citas de control regulares. Qu efectos secundarios puedo tener al [employer] este medicamento? Efectos secundarios que debe informar a su mdico o a Barrister's clerk de la salud tan pronto como sea posible: Chief of Staff, como erupcin cutnea, comezn/picazn o urticarias, e hinchazn de la cara, los labios o la lengua anxiedad, confusin o depresin problemas respiratorios cambios en la visin dolor ocular sensacin de Youth worker o aturdimiento, cadas dolor de la mandbula, especialmente despus de tratamiento odontolgico llagas en la boca calambres,  rigidez o debilidad muscular enrojecimiento, formacin de ampollas, descamacin o distensin de la piel, incluso dentro de la boca dificultad para orinar o cambios en el volumen de orina Efectos secundarios que generalmente no requieren atencin mdica (infrmelos a su mdico o a Barrister's clerk de la salud si persisten o si son molestos): dolores en los [place], las articulaciones o los msculos estreimiento diarrea fiebre cada del cabello irritacin en el lugar de la inyeccin prdida del apetito nuseas, vmito Higher education careers adviser dificultad para conciliar el sueo problemas para tragar debilidad o cansancio Puede ser que esta lista no menciona todos los posibles efectos secundarios. Comunquese a su mdico por asesoramiento mdico [employer] Usted puede informar los efectos secundarios a la FDA por telfono al 1-800-FDA-1088. Dnde debo guardar mi medicina? Este medicamento se administra en hospitales o clnicas y no necesitar guardarlo en su domicilio. ATENCIN: Este folleto es un resumen. Puede ser que no cubra toda la posible informacin. Si usted tiene preguntas acerca de esta medicina, consulte con su mdico, su farmacutico o su profesional de Technical sales engineer.  2018 Elsevier/Gold Standard (2016-09-29 00:00:00)

## 2017-04-22 IMAGING — NM NM GASTRIC EMPTYING
5 series · 5 of 5 positions shown · non-contrast
Comparison: None

CLINICAL DATA: Chronic bloating, gastritis, history diabetes
mellitus, hypertension, question syndrome, asthma, COPD

EXAM:
NUCLEAR MEDICINE GASTRIC EMPTYING SCAN
TECHNIQUE: After oral ingestion of radiolabeled meal, sequential abdominal
images were obtained for 4 hours. Percentage of activity emptying
the stomach was calculated at 1 hour, 2 hour, 3 hour, and 4 hours.
RADIOPHARMACEUTICALS:  2.0 mCi Oc-CCm sulfur colloid in standardized
meal

[Series 1: 0 min · 4.14mm/px · 1 of 1 slices shown]
[im 1/1]
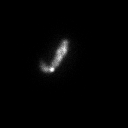

[Series 2: 1 hr · 4.14mm/px · 1 of 1 slices shown]
[im 1/1]
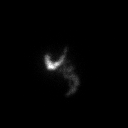

[Series 3: 2 hr · 4.14mm/px · 1 of 1 slices shown]
[im 1/1]
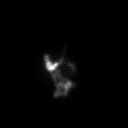

[Series 4: 90 min · 4.14mm/px · 1 of 1 slices shown]
[im 1/1]
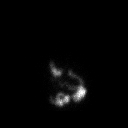

[Series 5: 4 hour · 4.14mm/px · 1 of 1 slices shown]
[im 1/1]
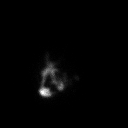

[5 of 5 positions shown; findings below may reference images not displayed]

FINDINGS: Expected location of the stomach in the left upper quadrant.

Ingested meal empties the stomach gradually over the course of the
study.

17% emptied at 1 hr ( normal >= 10%)

33% emptied at 2 hr ( normal >= 40%)

70% emptied at 3 hr ( normal >= 70%)

80% emptied at 4 hr ( normal >= 90%)
IMPRESSION: Mildly delayed gastric emptying as above.

## 2017-05-13 ENCOUNTER — Other Ambulatory Visit: Payer: Self-pay | Admitting: Family Medicine

## 2017-05-13 DIAGNOSIS — F411 Generalized anxiety disorder: Secondary | ICD-10-CM

## 2017-05-16 NOTE — Telephone Encounter (Signed)
Receive request for RF on Clonazepam. Rxn was prescribed by Dr. Adrian Blackwater for anxiety. NCCSD reviewed for 6 mths. Pt has been getting this filled consistently every month at one pharmacy. She has appt with me later this mth. Will give 1 mth RF until she sees me.

## 2017-05-16 NOTE — Telephone Encounter (Signed)
Pt. Called requesting a refill on clonazePAM (KLONOPIN) 0.5 MG tablet  Pt. Has been out of medication for the past dew days. Please f/u with pt.

## 2017-05-22 ENCOUNTER — Telehealth: Payer: Self-pay | Admitting: Pulmonary Disease

## 2017-05-22 ENCOUNTER — Ambulatory Visit (INDEPENDENT_AMBULATORY_CARE_PROVIDER_SITE_OTHER): Payer: Medicaid Other | Admitting: Adult Health

## 2017-05-22 ENCOUNTER — Encounter: Payer: Self-pay | Admitting: Adult Health

## 2017-05-22 DIAGNOSIS — J45998 Other asthma: Secondary | ICD-10-CM | POA: Diagnosis not present

## 2017-05-22 NOTE — Addendum Note (Signed)
Addended by: Parke Poisson E on: 05/22/2017 04:55 PM   Modules accepted: Orders

## 2017-05-22 NOTE — Assessment & Plan Note (Signed)
Cont on weight loss  She is down 10lbs.

## 2017-05-22 NOTE — Telephone Encounter (Signed)
Spoke with pt, she refused to see NP and wanted to wait until BQ next available appt. FYI to BQ to see if this is ok. Appt made for 11/30 @ 3:45pm

## 2017-05-22 NOTE — Telephone Encounter (Signed)
noted 

## 2017-05-22 NOTE — Assessment & Plan Note (Signed)
Stable without flare  Continue to wean steroids slowly   Plan  . Patient Instructions  Decrease Prednisone 1mg  every other day .  Continue on Dulera 2 puffs Twice daily  , rinse after use.  follow up Dr. Lake Bells in 6 weeks and As needed   Please contact office for sooner follow up if symptoms do not improve or worsen or seek emergency care

## 2017-05-22 NOTE — Patient Instructions (Addendum)
Decrease Prednisone 1mg  every other day .  Continue on Dulera 2 puffs Twice daily  , rinse after use.  follow up Dr. Lake Bells in 6 weeks and As needed   Please contact office for sooner follow up if symptoms do not improve or worsen or seek emergency care

## 2017-05-22 NOTE — Progress Notes (Signed)
Reviewed, agree 

## 2017-05-22 NOTE — Progress Notes (Signed)
$'@Patient'R$  ID: Raven Turner, female    DOB: September 26, 1970, 46 y.o.   MRN: [id number]  Chief Complaint  Patient presents with  . 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 Onset was childhood but continued to have "good days and bad days" until 2010 and no good days since then even p pred started and no better since arrived in [place] summer 2015 withno evidence of any airflow obst by pfts 11/03/14   TEST  11/03/14 PFTs p am dulera 100 on 30 mg prednisone FEV1 1.56(73%) ratio 85 = No obst, including fef 25-75%, only abn is reduction in ERV DLCO nml  spirometry 03/31/15 1.48 (62%) ratio 82 p am dulera and while symptomatic requesting saba July 2017 IgE 55 (normal), CBC without eosinophils Echo 2016 Grade 2 DD  Sleep study 2016 and 2018 NEG for OSA   05/22/2017 Follow up : Asthma/VCD/Psuedow[place]zing (visit done with portable interpretor)  Patient presents for a 6 week follow-up. She remains on Dulera twice daily. Last visit. Prednisone was decreased to 1 mg daily . She denies flare of cough or wheezing .  No increased SABA use.    Declines flu shot .    Allergies  Allergen Reactions  . Shellfish Allergy Anaphylaxis  . Septra [Sulfamethoxazole-Trimethoprim] Rash, Other (See Comments) and Itching    Facial redness with pruritus  Facial redness with pruritus   . Hydrocortisone Rash    Immunization History  Administered Date(s) Administered  . Hepatitis B 03/23/2015, 07/06/2015  . Influenza Split 05/25/2015  . Influenza,inj,Quad PF,6+ Mos 05/13/2016  . Pneumococcal-Unspecified 04/12/2012  . Tdap 06/24/2015    Past Medical History:  Diagnosis Date  . Anemia   . Asthma   . Bilateral hand numbness 06/25/2015  . Bone pain 02/27/2015   Diffuse bone pain in legs mostly but also in arms    . Breast pain, left 03/27/2015  . Callus of heel 12/16/2014  . Compression fracture   . Compression fracture of L1 lumbar vertebra ([facility]) 08/26/2014  . Compression fracture of T12 vertebra ([place]) 08/28/2014  . COPD (chronic obstructive pulmonary disease) ([place]) 2013  . Cushing syndrome ([place]) 2013   . Depression   . Diabetes ([place]) 2013   . Diabetes mellitus type 2, uncontrolled ([place]) 08/26/2014   Sees Dr. Katy Fitch.  Last visit 11/27/14.  No diabetic retinopathy    . Diabetes mellitus with neuropathy ([place])   . Diastolic dysfunction without heart failure 10/14/2014   Grade 2 diastolic dysfunction 2/2 pulm HTN   . Generalized anxiety disorder 03/27/2015  . HTN (hypertension) 2005   . Hx of cataract surgery 11/03/2014  . LUQ abdominal pain 07/17/2015  . Medication management 05/12/2015  . Morbid obesity ([place]) 09/08/2014  . 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 Non-English speaking patient 09/08/2014  . OSA (obstructive sleep apnea) 05/20/2015  . Osteopenia 11/19/2013   Dx with osteopenia in lumbar vertebra with partial compression of L1  . Pain due to onychomycosis of toenail 03/27/2015  . Secondary Cushing's syndrome/ iatrogenic   . Skin rash 07/17/2015  . Tachycardia 01/13/2015  . Upper airway cough syndrome 09/05/2014   Followed in Pulmonary clinic/ [facility]/ [facility]  - Trial off advair      09/05/2014 >>>  - trial  off theoph 09/17/2014 and added flutter  - rec reduce dulera to 100 2 bid 07/13/2015     . Vision changes 06/25/2015  . Vitamin D deficiency 08/28/2014    Tobacco History: History  Smoking Status  . Never Smoker  Smokeless Tobacco  . Never Used   Counseling given: Not Answered   Outpatient Encounter Prescriptions as of 05/22/2017  Medication Sig  . acetaminophen-codeine (TYLENOL #3) 300-30 MG tablet TAKE 1 TO 2 TABLETS BY MOUTH AT BEDTIME AS NEEDED FOR MODERATE PAIN  . Albiglutide (TANZEUM) 50 MG PEN Inject 50 mg into the skin once  a week.  Marland Kitchen albuterol (PROVENTIL HFA;VENTOLIN HFA) 108 (90 Base) MCG/ACT inhaler Inhale 2 puffs into the lungs every 4 (four) hours as needed for wheezing or shortness of breath (((PLAN B))).  Marland Kitchen albuterol (PROVENTIL) (2.5 MG/3ML) 0.083% nebulizer solution USE 1 VIAL VIA NEBULIZER EVERY 4 HOURS AS NEEDED FOR WHEEZING OR SHORTNESS OF BREATH  . aspirin EC 81 MG tablet Take 1 tablet (81 mg total) by mouth daily.  Marland Kitchen atorvastatin (LIPITOR) 20 MG tablet TAKE 1 TABLET BY MOUTH EVERY DAY  . Betamethasone Valerate 0.12 % foam Apply 1 application topically daily. To scalp  . betamethasone valerate ointment (VALISONE) 0.1 % Apply 1 application topically 2 (two) times daily. To R lower leg rash  . Blood Glucose Monitoring Suppl (ACCU-CHEK AVIVA PLUS) W/DEVICE KIT Used as directed  . buprenorphine (BUTRANS - DOSED MCG/HR) 5 MCG/HR PTWK patch Place 5 mcg onto the skin once a week.  . cholecalciferol (VITAMIN D) 1000 UNITS tablet Take 1,000 Units by mouth every morning.  . clonazePAM (KLONOPIN) 0.5 MG tablet TAKE 1 TABLET BY MOUTH TWICE DAILY AS NEEDED FOR ANXIETY OR SLEEP  . cloNIDine (CATAPRES) 0.2 MG tablet Take 1 tablet (0.2 mg total) by mouth 3 (three) times daily.  . clotrimazole-betamethasone (LOTRISONE) cream Apply to affected area 2 times daily prn  . dextromethorphan-guaiFENesin (MUCINEX DM) 30-600 MG 12hr tablet Take 1 tablet by mouth 2 (two) times daily as needed (with flutter valve for cough and congestion). Reported on 04/01/2016  . ferrous sulfate 325 (65 FE) MG tablet Take 1 tablet (325 mg total) by mouth daily with breakfast.  . fluticasone (CUTIVATE) 0.05 % cream Apply topically 2 (two) times daily.  Marland Kitchen FOLIC ACID PO Take 1 tablet by mouth every morning.  . furosemide (LASIX) 20 MG tablet Take 1 tablet (20 mg total) by mouth every morning.  . gabapentin (NEURONTIN) 400 MG capsule Take 2 capsules (800 mg total) by mouth 3 (three) times daily.  Marland Kitchen glucose blood (RELION GLUCOSE TEST STRIPS) test strip  1 each by Other route 4 (four) times daily. ICD code E11.65  . HUMULIN R U-500 KWIKPEN 500 UNIT/ML kwikpen Inject 120-180 Units into the skin 3 (three) times daily. 180 U before breakfast, 160 U before lunch, 140 U before dinner  . Insulin Pen Needle (B-D ULTRAFINE III SHORT PEN) 31G X 8 MM MISC 1 application by Does not apply route daily.  . Insulin Syringe-Needle U-100 (BD INSULIN SYRINGE ULTRAFINE) 31G X 15/64" 1 ML MISC 1 each by Does not apply route 3 (three) times daily.  Elmore Guise Devices (ACCU-CHEK SOFTCLIX) lancets Use as instructed  . levothyroxine (SYNTHROID, LEVOTHROID) 25 MCG tablet Take 25 mcg by mouth daily.  Marland Kitchen losartan (COZAAR) 100 MG tablet Take 1 tablet (100 mg total) by mouth every morning.  . metFORMIN (GLUCOPHAGE) 1000 MG tablet TAKE 1 TABLET BY MOUTH TWICE DAILY WITH A MEAL  .  methocarbamol (ROBAXIN) 750 MG tablet TAKE ONE TABLET BY MOUTH EVERY EIGHT HOURS AS NEEDED FOR MUSCLE SPASMS  . methylphenidate (RITALIN LA) 10 MG 24 hr capsule Take 10 mg by mouth daily.  . metoCLOPramide (REGLAN) 5 MG tablet Take 5 mg by mouth 4 (four) times daily -  with meals and at bedtime.  . Misc. Devices (CANE) MISC 1 each by Does not apply route daily.  . mometasone-formoterol (DULERA) 200-5 MCG/ACT AERO Inhale 2 puffs into the lungs 2 (two) times daily.  . montelukast (SINGULAIR) 10 MG tablet TAKE 1 TABLET BY MOUTH EVERY NIGHT AT BEDTIME  . mupirocin ointment (BACTROBAN) 2 % Place 1 application into the nose 2 (two) times daily.  Marland Kitchen omeprazole (PRILOSEC) 20 MG capsule TAKE ONE CAPSULE BY MOUTH EVERY DAY BEFORE BREAKFAST  . Oyster Shell Calcium 500 MG TABS Take 1 tablet (1,250 mg total) by mouth daily.  Marland Kitchen PARoxetine (PAXIL) 20 MG tablet TAKE 1 TABLET BY MOUTH EVERY DAY  . predniSONE (DELTASONE) 1 MG tablet Take 1 tablet (1 mg total) by mouth daily with breakfast.  . ranitidine (ZANTAC) 300 MG tablet TAKE 1 TABLET BY MOUTH EVERY NIGHT AT BEDTIME  . ReliOn Ultra Thin Lancets MISC 1 each by Does  not apply route 4 (four) times daily. ICD code E11.65  . Respiratory Therapy Supplies (FLUTTER) DEVI Use as directed  . Spacer/Aero-Holding Chambers (AEROCHAMBER MV) inhaler Use as instructed  . ULTICARE MICRO PEN NEEDLES 32G X 4 MM MISC USE AS DIRECTED  . verapamil (CALAN-SR) 180 MG CR tablet Take 2 tablets (360 mg total) by mouth at bedtime.   Facility-Administered Encounter Medications as of 05/22/2017  Medication  . betamethasone acetate-betamethasone sodium phosphate (CELESTONE) injection 3 mg  . DOBUTamine (DOBUTREX) 1,000 mcg/mL in dextrose 5% 250 mL infusion     Review of Systems  Constitutional:   No  weight loss, night sweats,  Fevers, chills,  +fatigue, or  lassitude.  HEENT:   No headaches,  Difficulty swallowing,  Tooth/dental problems, or  Sore throat,                No sneezing, itching, ear ache, nasal congestion, post nasal drip,   CV:  No chest pain,  Orthopnea, PND, swelling in lower extremities, anasarca, dizziness, palpitations, syncope.   GI  No heartburn, indigestion, abdominal pain, nausea, vomiting, diarrhea, change in bowel habits, loss of appetite, bloody stools.   Resp:    No chest wall deformity  Skin: no rash or lesions.  GU: no dysuria, change in color of urine, no urgency or frequency.  No flank pain, no hematuria   MS:  No joint pain or swelling.  No decreased range of motion.  No back pain.    Physical Exam  BP 112/68 (BP Location: Left Arm, Cuff Size: Large)   Pulse 60   Ht '4\' 10"'$  (1.473 m)   Wt 195 lb (88.5 kg)   SpO2 98%   BMI 40.76 kg/m   GEN: A/Ox3; pleasant , NAD, obese    HEENT:  [place]/AT,  EACs-clear, TMs-wnl, NOSE-clear, THROAT-clear, no lesions, no postnasal drip or exudate noted.   NECK:  Supple w/ fair ROM; no JVD; normal carotid impulses w/o bruits; no thyromegaly or nodules palpated; no lymphadenopathy.    RESP  Clear  P & A; w/o, wheezes/ rales/ or rhonchi. no accessory muscle use, no dullness to percussion  CARD:  RRR,  no m/r/g, no peripheral edema, pulses intact, no cyanosis or clubbing.  GI:  Soft & nt; nml bowel sounds; no organomegaly or masses detected.   Musco: Warm bil, no deformities or joint swelling noted.   Neuro: alert, no focal deficits noted.    Skin: Warm, no lesions or rashes    Lab Results:  CBC   BNP  Imaging: No results found.   Assessment & Plan:   No problem-specific Assessment & Plan notes found for this encounter.     Rexene Edison, NP 05/22/2017

## 2017-05-29 ENCOUNTER — Ambulatory Visit: Payer: Medicaid Other | Attending: Internal Medicine | Admitting: Internal Medicine

## 2017-05-29 ENCOUNTER — Encounter: Payer: Self-pay | Admitting: Internal Medicine

## 2017-05-29 VITALS — BP 116/76 | HR 72 | Temp 98.5°F | Resp 16 | Wt 193.6 lb

## 2017-05-29 DIAGNOSIS — Z7982 Long term (current) use of aspirin: Secondary | ICD-10-CM | POA: Diagnosis not present

## 2017-05-29 DIAGNOSIS — N760 Acute vaginitis: Secondary | ICD-10-CM | POA: Insufficient documentation

## 2017-05-29 DIAGNOSIS — E559 Vitamin D deficiency, unspecified: Secondary | ICD-10-CM | POA: Insufficient documentation

## 2017-05-29 DIAGNOSIS — I10 Essential (primary) hypertension: Secondary | ICD-10-CM

## 2017-05-29 DIAGNOSIS — L218 Other seborrheic dermatitis: Secondary | ICD-10-CM

## 2017-05-29 DIAGNOSIS — G5603 Carpal tunnel syndrome, bilateral upper limbs: Secondary | ICD-10-CM | POA: Insufficient documentation

## 2017-05-29 DIAGNOSIS — F329 Major depressive disorder, single episode, unspecified: Secondary | ICD-10-CM | POA: Diagnosis present

## 2017-05-29 DIAGNOSIS — M549 Dorsalgia, unspecified: Secondary | ICD-10-CM | POA: Insufficient documentation

## 2017-05-29 DIAGNOSIS — L210 Seborrhea capitis: Secondary | ICD-10-CM

## 2017-05-29 DIAGNOSIS — X58XXXS Exposure to other specified factors, sequela: Secondary | ICD-10-CM | POA: Insufficient documentation

## 2017-05-29 DIAGNOSIS — Z794 Long term (current) use of insulin: Secondary | ICD-10-CM

## 2017-05-29 DIAGNOSIS — E242 Drug-induced Cushing's syndrome: Secondary | ICD-10-CM | POA: Insufficient documentation

## 2017-05-29 DIAGNOSIS — E1165 Type 2 diabetes mellitus with hyperglycemia: Secondary | ICD-10-CM | POA: Diagnosis not present

## 2017-05-29 DIAGNOSIS — Z23 Encounter for immunization: Secondary | ICD-10-CM | POA: Diagnosis not present

## 2017-05-29 DIAGNOSIS — M8008XS Age-related osteoporosis with current pathological fracture, vertebra(e), sequela: Secondary | ICD-10-CM

## 2017-05-29 DIAGNOSIS — G4733 Obstructive sleep apnea (adult) (pediatric): Secondary | ICD-10-CM | POA: Diagnosis not present

## 2017-05-29 DIAGNOSIS — T380X5A Adverse effect of glucocorticoids and synthetic analogues, initial encounter: Secondary | ICD-10-CM | POA: Insufficient documentation

## 2017-05-29 DIAGNOSIS — E118 Type 2 diabetes mellitus with unspecified complications: Secondary | ICD-10-CM

## 2017-05-29 DIAGNOSIS — G8929 Other chronic pain: Secondary | ICD-10-CM | POA: Insufficient documentation

## 2017-05-29 DIAGNOSIS — D352 Benign neoplasm of pituitary gland: Secondary | ICD-10-CM | POA: Diagnosis not present

## 2017-05-29 DIAGNOSIS — M8088XS Other osteoporosis with current pathological fracture, vertebra(e), sequela: Secondary | ICD-10-CM

## 2017-05-29 DIAGNOSIS — E039 Hypothyroidism, unspecified: Secondary | ICD-10-CM | POA: Insufficient documentation

## 2017-05-29 DIAGNOSIS — F32A Depression, unspecified: Secondary | ICD-10-CM

## 2017-05-29 DIAGNOSIS — F419 Anxiety disorder, unspecified: Secondary | ICD-10-CM | POA: Diagnosis not present

## 2017-05-29 DIAGNOSIS — R21 Rash and other nonspecific skin eruption: Secondary | ICD-10-CM | POA: Insufficient documentation

## 2017-05-29 LAB — GLUCOSE, POCT (MANUAL RESULT ENTRY): POC Glucose: 241 mg/dl — AB (ref 70–99)

## 2017-05-29 LAB — POCT GLYCOSYLATED HEMOGLOBIN (HGB A1C): Hemoglobin A1C: 6.5

## 2017-05-29 MED ORDER — CLONAZEPAM 0.5 MG PO TABS: 0.5000 mg | ORAL_TABLET | Freq: Every day | ORAL | 0 refills | Status: DC | PRN

## 2017-05-29 NOTE — Patient Instructions (Addendum)
PURCHASE and use Selsun Blue or Hand and Shoulder Shampoo at [place] and use twice a week.      Influenza Virus Vaccine injection (Fluarix)   What is this medicine? INFLUENZA VIRUS VACCINE (in floo EN zuh VAHY ruhs vak SEEN) helps to reduce the risk of getting influenza also known as the flu. This medicine may be used for other purposes; ask your health care provider or pharmacist if you have questions. COMMON BRAND NAME(S): Fluarix, Fluzone What should I tell my health care provider before I take this medicine? They need to know if you have any of these conditions: -bleeding disorder like hemophilia -fever or infection -Guillain-Barre syndrome or other neurological problems -immune system problems -infection with the human immunodeficiency virus (HIV) or AIDS -low blood platelet counts -multiple sclerosis -an unusual or allergic reaction to influenza virus vaccine, eggs, chicken proteins, latex, gentamicin, other medicines, foods, dyes or preservatives -pregnant or trying to get pregnant -breast-feeding How should I use this medicine? This vaccine is for injection into a muscle. It is given by a health care professional. A copy of Vaccine Information Statements will be given before each vaccination. Read this sheet carefully each time. The sheet may change frequently. Talk to your pediatrician regarding the use of this medicine in children. Special care may be needed. Overdosage: If you think you have taken too much of this medicine contact a poison control center or emergency room at once. NOTE: This medicine is only for you. Do not share this medicine with others. What if I miss a dose? This does not apply. What may interact with this medicine? -chemotherapy or radiation therapy -medicines that lower your immune system like etanercept, anakinra, infliximab, and adalimumab -medicines that treat or prevent blood clots like warfarin -phenytoin -steroid medicines like prednisone  or cortisone -theophylline -vaccines This list may not describe all possible interactions. Give your health care provider a list of all the medicines, herbs, non-prescription drugs, or dietary supplements you use. Also tell them if you smoke, drink alcohol, or use illegal drugs. Some items may interact with your medicine. What should I watch for while using this medicine? Report any side effects that do not go away within 3 days to your doctor or health care professional. Call your health care provider if any unusual symptoms occur within 6 weeks of receiving this vaccine. You may still catch the flu, but the illness is not usually as bad. You cannot get the flu from the vaccine. The vaccine will not protect against colds or other illnesses that may cause fever. The vaccine is needed every year. What side effects may I notice from receiving this medicine? Side effects that you should report to your doctor or health care professional as soon as possible: -allergic reactions like skin rash, itching or hives, swelling of the face, lips, or tongue Side effects that usually do not require medical attention (report to your doctor or health care professional if they continue or are bothersome): -fever -headache -muscle aches and pains -pain, tenderness, redness, or swelling at site where injected -weak or tired This list may not describe all possible side effects. Call your doctor for medical advice about side effects. You may report side effects to FDA at 1-800-FDA-1088. Where should I keep my medicine? This vaccine is only given in a clinic, pharmacy, doctor's office, or other health care setting and will not be stored at home. NOTE: This sheet is a summary. It may not cover all possible information. If you have  questions about this medicine, talk to your doctor, pharmacist, or health care provider.  2018 Elsevier/Gold Standard (2008-03-26 09:30:40)

## 2017-05-29 NOTE — Progress Notes (Signed)
Patient ID: Raven Turner, female    DOB: May 20, 1971  MRN: [id number]  CC: re-establish; Diabetes; Hypertension; and Back Pain   Subjective: Raven Turner is a 46 y.o. female who presents for chronic ds mangement. Last saw Dr. Adrian Blackwater 03/2017 Her concerns today include: pt with history of steroid-induced osteoporosis with history of vertebral compression fractures, steroid-induced Cushing syndrome, obesity, fatty liver, HTN, diabetes type 2 with microalbumin, anxiety disorder ( on clonazepam) and pituitary microadenoma  1. Osteoporosis/hx of compression fractures: -did get Reclast infusion 03/2017 at the hospital per Dr Adrian Blackwater orders.  -also on Vit D and Calcium once a day -sees pain specialist in [place]. On Butrans patch Q wk.  -pulmonary weaning her off Prednisone; currently 1 mg.  Does not think she has true asthma; more upper airway sounds  2.DM/Pituatary Microadenonma with elev Prolactine/Hypothyroid: -sees Dr. Dagmar Hait with [facility]. Last seen 03/2017 -on Humulin R 500 180/140/120 units and Metformin -lows sometimes at nights but not often -BS readings have been good -eating 3 meals a day. 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 HTN Limits salt in foods On Clonidine, Losartan and Verapimil. Compliant  4. Dep/anxiety: -on Paxil for years and Clonazepam. Symptoms controlled  5. Rash on scalp and around ears more so on the right side for several weeks  Patient Active Problem List   Diagnosis Date Noted  . Pituitary microadenoma ([place]) 03/13/2017  . Elevated prolactin level ([place]) 01/19/2017  . Microalbuminuria due to type 2 diabetes mellitus ([place]) 12/19/2016  . Elevated TSH 12/19/2016  . Menstrual changes 12/19/2016  . Flaking of scalp 07/18/2016  . Acral type peeling skin syndrome 07/18/2016  . Non-traumatic compression fracture of vertebral column ([place]) 01/20/2016  . Recurrent abdominal pain 01/11/2016  .  Bilateral carpal tunnel syndrome 12/21/2015  . Chronic back pain 11/05/2015  . NASH (nonalcoholic steatohepatitis) 09/25/2015  . Pruritic erythematous rash 07/17/2015  . Bilateral hand numbness 06/25/2015  . Vision changes 06/25/2015  . OSA (obstructive sleep apnea) 05/20/2015  . Generalized anxiety disorder 03/27/2015  . Pain due to onychomycosis of toenail 03/27/2015  . Breast pain, left 03/27/2015  . Bone pain 02/27/2015  . Callus of heel 12/16/2014  . 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 Hx of cataract surgery 11/03/2014  . Diastolic dysfunction without heart failure 10/14/2014  . Morbid obesity ([place]) 09/08/2014  . Non-English speaking patient 09/08/2014  . Upper airway cough syndrome 09/05/2014  . Asthma ? severity vs VCD/ pseudo asthma 09/05/2014  . Vitamin D deficiency 08/28/2014  . Compression fracture of T12 vertebra ([place]) 08/28/2014  . Diabetes mellitus type 2, uncontrolled ([place]) 08/26/2014  . Compression fracture of L1 lumbar vertebra (HCC) 08/26/2014  . Essential (primary) hypertension   . Secondary Cushing's syndrome/ iatrogenic   . 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     Current Outpatient Prescriptions on File Prior to Visit  Medication Sig Dispense Refill  . Albiglutide (TANZEUM) 50 MG PEN Inject 50 mg into the skin once a week. 4 each 3  . albuterol (PROVENTIL HFA;VENTOLIN HFA) 108 (90 Base) MCG/ACT inhaler Inhale 2 puffs into the lungs every 4 (four) hours as needed for wheezing or shortness of breath (((PLAN B))).    Marland Kitchen albuterol (PROVENTIL) (2.5 MG/3ML) 0.083% nebulizer solution USE 1 VIAL VIA NEBULIZER EVERY 4 HOURS AS NEEDED FOR WHEEZING OR SHORTNESS OF BREATH 90 mL 0  . aspirin EC 81 MG tablet Take 1 tablet (81 mg total) by mouth daily. 90 tablet  3  . atorvastatin (LIPITOR) 20 MG tablet TAKE 1 TABLET BY MOUTH EVERY DAY 90 tablet 0  . Blood Glucose Monitoring Suppl (ACCU-CHEK AVIVA PLUS) W/DEVICE KIT Used as directed 1 kit 0  . buprenorphine (BUTRANS - DOSED  MCG/HR) 5 MCG/HR PTWK patch Place 5 mcg onto the skin once a week.    . cabergoline (DOSTINEX) 0.5 MG tablet Take 0.25 mg by mouth 2 (two) times a week.    . cholecalciferol (VITAMIN D) 1000 UNITS tablet Take 1,000 Units by mouth every morning.    . clonazePAM (KLONOPIN) 0.5 MG tablet TAKE 1 TABLET BY MOUTH TWICE DAILY AS NEEDED FOR ANXIETY OR SLEEP 60 tablet 0  . cloNIDine (CATAPRES) 0.2 MG tablet Take 1 tablet (0.2 mg total) by mouth 3 (three) times daily. 90 tablet 2  . clotrimazole-betamethasone (LOTRISONE) cream Apply to affected area 2 times daily prn 45 g 0  . dextromethorphan-guaiFENesin (MUCINEX DM) 30-600 MG 12hr tablet Take 1 tablet by mouth 2 (two) times daily as needed (with flutter valve for cough and congestion). Reported on 04/01/2016    . ferrous sulfate 325 (65 FE) MG tablet Take 1 tablet (325 mg total) by mouth daily with breakfast. 30 tablet 3  . FOLIC ACID PO Take 1 tablet by mouth every morning.    . furosemide (LASIX) 20 MG tablet Take 1 tablet (20 mg total) by mouth every morning. 30 tablet 2  . gabapentin (NEURONTIN) 400 MG capsule Take 2 capsules (800 mg total) by mouth 3 (three) times daily. 180 capsule 3  . glucose blood (RELION GLUCOSE TEST STRIPS) test strip 1 each by Other route 4 (four) times daily. ICD code E11.65 400 each 3  . HUMULIN R U-500 KWIKPEN 500 UNIT/ML kwikpen Inject 120-180 Units into the skin 3 (three) times daily. 180 U before breakfast, 160 U before lunch, 140 U before dinner 27 mL 11  . Insulin Pen Needle (B-D ULTRAFINE III SHORT PEN) 31G X 8 MM MISC 1 application by Does not apply route daily. 100 each 3  . Insulin Syringe-Needle U-100 (BD INSULIN SYRINGE ULTRAFINE) 31G X 15/64" 1 ML MISC 1 each by Does not apply route 3 (three) times daily. 300 each 3  . Lancet Devices (ACCU-CHEK SOFTCLIX) lancets Use as instructed 1 each 0  . levothyroxine (SYNTHROID, LEVOTHROID) 25 MCG tablet Take 25 mcg by mouth daily.  3  . losartan (COZAAR) 100 MG tablet Take 1  tablet (100 mg total) by mouth every morning. 90 tablet 3  . metFORMIN (GLUCOPHAGE) 1000 MG tablet TAKE 1 TABLET BY MOUTH TWICE DAILY WITH A MEAL 60 tablet 2  . methocarbamol (ROBAXIN) 750 MG tablet TAKE ONE TABLET BY MOUTH EVERY EIGHT HOURS AS NEEDED FOR MUSCLE SPASMS 90 tablet 0  . methylphenidate (RITALIN LA) 10 MG 24 hr capsule Take 10 mg by mouth daily.    . metoCLOPramide (REGLAN) 5 MG tablet Take 5 mg by mouth 4 (four) times daily -  with meals and at bedtime.  5  . Misc. Devices (CANE) MISC 1 each by Does not apply route daily. 1 each 0  . mometasone-formoterol (DULERA) 200-5 MCG/ACT AERO Inhale 2 puffs into the lungs 2 (two) times daily. 1 Inhaler 5  . montelukast (SINGULAIR) 10 MG tablet TAKE 1 TABLET BY MOUTH EVERY NIGHT AT BEDTIME 90 tablet 0  . mupirocin ointment (BACTROBAN) 2 % Place 1 application into the nose 2 (two) times daily. 22 g 0  . omeprazole (PRILOSEC) 20 MG capsule TAKE  ONE CAPSULE BY MOUTH EVERY DAY BEFORE BREAKFAST 90 capsule 0  . Oyster Shell Calcium 500 MG TABS Take 1 tablet (1,250 mg total) by mouth daily. 90 tablet 3  . PARoxetine (PAXIL) 20 MG tablet TAKE 1 TABLET BY MOUTH EVERY DAY 90 tablet 0  . predniSONE (DELTASONE) 1 MG tablet Take 1 tablet (1 mg total) by mouth daily with breakfast. 30 tablet 2  . ranitidine (ZANTAC) 300 MG tablet TAKE 1 TABLET BY MOUTH EVERY NIGHT AT BEDTIME 30 tablet 5  . ReliOn Ultra Thin Lancets MISC 1 each by Does not apply route 4 (four) times daily. ICD code E11.65 400 each 3  . Respiratory Therapy Supplies (FLUTTER) DEVI Use as directed 1 each 0  . Spacer/Aero-Holding Chambers (AEROCHAMBER MV) inhaler Use as instructed 1 each 0  . ULTICARE MICRO PEN NEEDLES 32G X 4 MM MISC USE AS DIRECTED 100 each 2  . verapamil (CALAN-SR) 180 MG CR tablet Take 2 tablets (360 mg total) by mouth at bedtime. 180 tablet 3   Current Facility-Administered Medications on File Prior to Visit  Medication Dose Route Frequency Provider Last Rate Last Dose  .  betamethasone acetate-betamethasone sodium phosphate (CELESTONE) injection 3 mg  3 mg Intramuscular Once Daylene Katayama M, DPM      . DOBUTamine (DOBUTREX) 1,000 mcg/mL in dextrose 5% 250 mL infusion  30 mcg/kg/min Intravenous Continuous Croitoru, Mihai, MD 145.3 mL/hr at 04/22/15 1300 30 mcg/kg/min at 04/22/15 1300    Allergies  Allergen Reactions  . Shellfish Allergy Anaphylaxis  . Septra [Sulfamethoxazole-Trimethoprim] Rash, Other (See Comments) and Itching    Facial redness with pruritus  Facial redness with pruritus   . Hydrocortisone Rash    Social History   Social History  . Marital status: Married    Spouse name: N/A  . Number of children: N/A  . Years of education: N/A   Occupational History  . Not on file.   Social History Main Topics  . Smoking status: Never Smoker  . Smokeless tobacco: Never Used  . Alcohol use No  . Drug use: No  . Sexual activity: Yes    Birth control/ protection: Surgical   Other Topics Concern  . Not on file   Social History Narrative   Lives at home with husband.  No children   She does not work   Highest level of education:  12th grade    Family History  Problem Relation Age of Onset  . Diabetes Mother   . Stroke Father   . Asthma Father   . Hypertension Sister   . Hypertension Brother     Past Surgical History:  Procedure Laterality Date  . CATARACT EXTRACTION Bilateral 2014  . HAND SURGERY Right 12/18/2015  . VAGINAL HYSTERECTOMY      ROS: Review of Systems Negative except as stated above PHYSICAL EXAM: BP 116/76   Pulse 72   Temp 98.5 F (36.9 C) (Oral)   Resp 16   Wt 193 lb 9.6 oz (87.8 kg)   SpO2 98%   BMI 40.46 kg/m   Wt Readings from Last 3 Encounters:  05/29/17 193 lb 9.6 oz (87.8 kg)  05/22/17 195 lb (88.5 kg)  04/17/17 196 lb (88.9 kg)    Physical Exam General appearance - alert, well appearing, obese middle-age female and in no distress Mental status - alert, oriented to person, place, and time,  normal mood, behavior, speech, dress, motor activity, and thought processes Mouth - mucous membranes moist, pharynx normal without lesions  Neck - supple, no significant adenopathy Chest - some upper airway sounds of wheezing Heart - normal rate, regular rhythm, normal S1, S2, no murmurs, rubs, clicks or gallops Extremities - no LE edema, good pulses Skin: white flaky patches on scalp and behind ears  Results for orders placed or performed in visit on 05/29/17  POCT glucose (manual entry)  Result Value Ref Range   POC Glucose 241 (A) 70 - 99 mg/dl  POCT glycosylated hemoglobin (Hb A1C)  Result Value Ref Range   Hemoglobin A1C 6.5     ASSESSMENT AND PLAN: 1. Controlled type 2 diabetes mellitus with complication, with long-term current use of insulin (HCC) -Continue healthy eating habits and exercise as tolerated Followed and managed by endocrinologist Dr. Buddy Duty - POCT glucose (manual entry) - POCT glycosylated hemoglobin (Hb A1C)  2. Essential (primary) hypertension At goal. Cont current meds  3. Pituitary microadenoma ([place]) -Followed by endocrinology. Recently started on Cabergoline  4. Dandruff in adult -vs psoriasis but the former more likely Recommend using head and shoulders or selenium blue shampoo twice a week  5. Fracture of vertebra due to osteoporosis, sequela -on Reclast once a yr -[facility]  6. Anxiety and depression Since she is stable on Paxil I recommend trying to taper her off of clonazepam. Patient is agreeable to trying. She is worried that she may not be able to sleep well at night once off of it. I told her that he can try her with something else to help with sleep that would not be habit-forming. -She is due for her next refill on clonazepam October 10. Prescription given to decrease to one tablet a day at that time. NCCSDRS reviewed and is appropriate. - clonazePAM (KLONOPIN) 0.5 MG tablet; Take 1 tablet (0.5 mg total) by  mouth daily as needed for anxiety.  Dispense: 30 tablet; Refill: 0  7. Need for influenza vaccination - Flu Vaccine QUAD 6+ mos PF IM (Fluarix Quad PF)  Patient was given the opportunity to ask questions.  Patient verbalized understanding of the plan and was able to repeat key elements of the plan.  [employer] interpreter used during this encounter.  Orders Placed This Encounter  Procedures  . Flu Vaccine QUAD 6+ mos PF IM (Fluarix Quad PF)  . POCT glucose (manual entry)  . POCT glycosylated hemoglobin (Hb A1C)     Requested Prescriptions   Signed Prescriptions Disp Refills  . clonazePAM (KLONOPIN) 0.5 MG tablet 30 tablet 0    Sig: Take 1 tablet (0.5 mg total) by mouth daily as needed for anxiety.    Return in about 2 months (around 07/29/2017).  Karle Plumber, MD, FACP

## 2017-06-12 ENCOUNTER — Other Ambulatory Visit: Payer: Self-pay | Admitting: Internal Medicine

## 2017-06-12 DIAGNOSIS — Z1231 Encounter for screening mammogram for malignant neoplasm of breast: Secondary | ICD-10-CM

## 2017-06-16 ENCOUNTER — Ambulatory Visit
Admission: RE | Admit: 2017-06-16 | Discharge: 2017-06-16 | Disposition: A | Discharge status: Home / Self Care | Payer: Medicaid Other | Source: Ambulatory Visit | Attending: Internal Medicine | Admitting: Internal Medicine

## 2017-06-16 DIAGNOSIS — Z1231 Encounter for screening mammogram for malignant neoplasm of breast: Secondary | ICD-10-CM

## 2017-06-19 ENCOUNTER — Ambulatory Visit: Payer: Self-pay | Admitting: Podiatry

## 2017-06-26 ENCOUNTER — Telehealth: Payer: Self-pay | Admitting: Internal Medicine

## 2017-06-26 DIAGNOSIS — G8929 Other chronic pain: Secondary | ICD-10-CM

## 2017-06-26 DIAGNOSIS — M549 Dorsalgia, unspecified: Principal | ICD-10-CM

## 2017-06-26 NOTE — Telephone Encounter (Signed)
Pt called to request a refill for  methocarbamol (ROBAXIN) 750 MG tablet  please sent it to the pharmacy  [facility] [place], [place] AT [address] Please follow up

## 2017-06-27 ENCOUNTER — Encounter: Payer: Self-pay | Admitting: Pharmacist

## 2017-06-27 MED ORDER — METHOCARBAMOL 750 MG PO TABS
ORAL_TABLET | ORAL | 0 refills | Status: DC
Start: 2017-06-27 — End: 2017-07-31

## 2017-06-27 NOTE — Progress Notes (Signed)
Prior authorization completed and approved for Humulin R 500 (concentrated insulin). Approval # [id number]

## 2017-06-27 NOTE — Telephone Encounter (Signed)
Refilled

## 2017-07-03 ENCOUNTER — Ambulatory Visit (INDEPENDENT_AMBULATORY_CARE_PROVIDER_SITE_OTHER): Payer: Medicaid Other | Admitting: Podiatry

## 2017-07-03 DIAGNOSIS — B351 Tinea unguium: Secondary | ICD-10-CM

## 2017-07-03 DIAGNOSIS — M79609 Pain in unspecified limb: Secondary | ICD-10-CM

## 2017-07-03 NOTE — Patient Instructions (Signed)

## 2017-07-05 NOTE — Progress Notes (Signed)
Subjective: Patient presents today for left great toenail pain that has been present for the past several years. She reports associated thickening and elongation of the nail. Wearing shoes increases the pain. She has not done anything to treat the symptoms. Patient presents today for further treatment and evaluation.   Past Medical History:  Diagnosis Date  . Anemia   . Asthma   . Bilateral hand numbness 06/25/2015  . Bone pain 02/27/2015   Diffuse bone pain in legs mostly but also in arms    . Breast pain, left 03/27/2015  . Callus of heel 12/16/2014  . Compression fracture   . Compression fracture of L1 lumbar vertebra (HCC) 08/26/2014  . Compression fracture of T12 vertebra ([place]) 08/28/2014  . COPD (chronic obstructive pulmonary disease) ([place]) 2013  . Cushing syndrome ([place]) 2013   . Depression   . Diabetes ([place]) 2013   . Diabetes mellitus type 2, uncontrolled ([place]) 08/26/2014   Sees Dr. Katy Fitch.  Last visit 11/27/14.  No diabetic retinopathy    . Diabetes mellitus with neuropathy ([place])   . Diastolic dysfunction without heart failure 10/14/2014   Grade 2 diastolic dysfunction 2/2 pulm HTN   . Generalized anxiety disorder 03/27/2015  . HTN (hypertension) 2005   . Hx of cataract surgery 11/03/2014  . LUQ abdominal pain 07/17/2015  . Medication management 05/12/2015  . Morbid obesity ([place]) 09/08/2014  . 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 Non-English speaking patient 09/08/2014  . OSA (obstructive sleep apnea) 05/20/2015  . Osteopenia 11/19/2013   Dx with osteopenia in lumbar vertebra with partial compression of L1  . Pain due to onychomycosis of toenail 03/27/2015  . Secondary Cushing's syndrome/ iatrogenic   . Skin rash 07/17/2015  . Tachycardia 01/13/2015  . Upper airway cough syndrome 09/05/2014   Followed in Pulmonary clinic/ [facility]/ [place]  - Trial off advair      09/05/2014 >>>  - trial off theoph 09/17/2014 and added flutter  - rec reduce dulera to 100 2 bid 07/13/2015     .  Vision changes 06/25/2015  . Vitamin D deficiency 08/28/2014    Objective: Physical Exam General: The patient is alert and oriented x3 in no acute distress.  Dermatology: Hyperkeratotic, discolored, thickened, onychodystrophy of left great toenail.  Skin is warm, dry and supple bilateral lower extremities. Negative for open lesions or macerations.  Vascular: Palpable pedal pulses bilaterally. No edema or erythema noted. Capillary refill within normal limits.  Neurological: Epicritic and protective threshold grossly intact bilaterally.   Musculoskeletal Exam: Range of motion within normal limits to all pedal and ankle joints bilateral. Muscle strength 5/5 in all groups bilateral.   Assessment: #1 painful dystrophic left great toenail  Plan of Care:  1. Patient was evaluated. 2. Discussed treatment alternatives and plan of care. Explained nail avulsion procedure and post procedure course to patient. 3. Patient opted for total temporary nail avulsion.  4. Prior to procedure, local anesthesia infiltration utilized using 3 ml of a 50:50 mixture of 2% plain lidocaine and 0.5% plain marcaine in a normal hallux block fashion and a betadine prep performed.  5. Light dressing applied. 6. Recommended applying topical antifungal as the nail regrows.  7. Return to clinic in 2 weeks.   Edrick Kins, DPM [facility]  Dr. Edrick Kins, DPM    [address]  [place], [place] [address]                Office [phone]  Fax [phone]

## 2017-07-10 ENCOUNTER — Telehealth: Payer: Self-pay | Admitting: Internal Medicine

## 2017-07-10 DIAGNOSIS — J45998 Other asthma: Secondary | ICD-10-CM

## 2017-07-10 DIAGNOSIS — F411 Generalized anxiety disorder: Secondary | ICD-10-CM

## 2017-07-10 NOTE — Telephone Encounter (Signed)
Pt called to request a refill for the following med PARoxetine (PAXIL) 20 MG tablet  atorvastatin (LIPITOR) 20 MG tablet  omeprazole (PRILOSEC) 20 MG capsule   Please sent it to [facility] on [address] Please follow up

## 2017-07-11 MED ORDER — OMEPRAZOLE 20 MG PO CPDR: DELAYED_RELEASE_CAPSULE | ORAL | 0 refills | Status: DC

## 2017-07-11 MED ORDER — ATORVASTATIN CALCIUM 20 MG PO TABS: 20.0000 mg | ORAL_TABLET | Freq: Every day | ORAL | 0 refills | Status: DC

## 2017-07-11 MED ORDER — PAROXETINE HCL 20 MG PO TABS: 20.0000 mg | ORAL_TABLET | Freq: Every day | ORAL | 0 refills | Status: DC

## 2017-07-11 NOTE — Telephone Encounter (Signed)
Refilled

## 2017-07-17 ENCOUNTER — Ambulatory Visit: Payer: Medicaid Other | Admitting: Podiatry

## 2017-07-17 DIAGNOSIS — M79609 Pain in unspecified limb: Secondary | ICD-10-CM | POA: Diagnosis not present

## 2017-07-17 DIAGNOSIS — B351 Tinea unguium: Secondary | ICD-10-CM | POA: Diagnosis not present

## 2017-07-18 ENCOUNTER — Other Ambulatory Visit: Payer: Self-pay | Admitting: Pharmacist

## 2017-07-18 DIAGNOSIS — J45998 Other asthma: Secondary | ICD-10-CM

## 2017-07-18 MED ORDER — MONTELUKAST SODIUM 10 MG PO TABS: 10.0000 mg | ORAL_TABLET | Freq: Every day | ORAL | 0 refills | Status: DC

## 2017-07-20 NOTE — Progress Notes (Signed)
   Subjective: Patient presents today 2 weeks post total temporary nail avulsion procedure of the left great toe. Patient states that the toe and nail fold is feeling much better. She reports mild pain. She has been soaking the toe which seems to help. She is here for further evaluation and treatment.    Past Medical History:  Diagnosis Date  . Anemia   . Asthma   . Bilateral hand numbness 06/25/2015  . Bone pain 02/27/2015   Diffuse bone pain in legs mostly but also in arms    . Breast pain, left 03/27/2015  . Callus of heel 12/16/2014  . Compression fracture   . Compression fracture of L1 lumbar vertebra (HCC) 08/26/2014  . Compression fracture of T12 vertebra ([place]) 08/28/2014  . COPD (chronic obstructive pulmonary disease) ([place]) 2013  . Cushing syndrome ([place]) 2013   . Depression   . Diabetes ([place]) 2013   . Diabetes mellitus type 2, uncontrolled ([place]) 08/26/2014   Sees Dr. Katy Fitch.  Last visit 11/27/14.  No diabetic retinopathy    . Diabetes mellitus with neuropathy ([place])   . Diastolic dysfunction without heart failure 10/14/2014   Grade 2 diastolic dysfunction 2/2 pulm HTN   . Generalized anxiety disorder 03/27/2015  . HTN (hypertension) 2005   . Hx of cataract surgery 11/03/2014  . LUQ abdominal pain 07/17/2015  . Medication management 05/12/2015  . Morbid obesity ([place]) 09/08/2014  . 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 Non-English speaking patient 09/08/2014  . OSA (obstructive sleep apnea) 05/20/2015  . Osteopenia 11/19/2013   Dx with osteopenia in lumbar vertebra with partial compression of L1  . Pain due to onychomycosis of toenail 03/27/2015  . Secondary Cushing's syndrome/ iatrogenic   . Skin rash 07/17/2015  . Tachycardia 01/13/2015  . Upper airway cough syndrome 09/05/2014   Followed in Pulmonary clinic/ [facility]/ [place]  - Trial off advair      09/05/2014 >>>  - trial off theoph 09/17/2014 and added flutter  - rec reduce dulera to 100 2 bid 07/13/2015     . Vision changes  06/25/2015  . Vitamin D deficiency 08/28/2014    Objective: Skin is warm, dry and supple. Nail bed and respective nail fold appears to be healing appropriately. Open wound to the associated nail fold with a granular wound base and moderate amount of fibrotic tissue. Minimal drainage noted.   Assessment: #1 postop temporary total nail avulsion of the left great toenail #2 open wound periungual nail fold and nail bed of respective digit.   Plan of care: #1 patient was evaluated  #2 debridement of open wound was performed to the periungual border and nail fold of the respective toe using a currette. Antibiotic ointment and Band-Aid was applied. #3 patient is to return to clinic on a PRN  basis.   Edrick Kins, DPM [facility]  Dr. Edrick Kins, [place]                                        [place], [place] [address]                Office [phone]  Fax [phone]

## 2017-07-31 ENCOUNTER — Ambulatory Visit: Payer: Medicaid Other | Attending: Internal Medicine | Admitting: Internal Medicine

## 2017-07-31 ENCOUNTER — Encounter: Payer: Self-pay | Admitting: Internal Medicine

## 2017-07-31 VITALS — BP 119/71 | HR 78 | Temp 98.1°F | Resp 18 | Ht <= 58 in | Wt 193.0 lb

## 2017-07-31 DIAGNOSIS — Z882 Allergy status to sulfonamides status: Secondary | ICD-10-CM | POA: Insufficient documentation

## 2017-07-31 DIAGNOSIS — D509 Iron deficiency anemia, unspecified: Secondary | ICD-10-CM | POA: Diagnosis not present

## 2017-07-31 DIAGNOSIS — Z91013 Allergy to seafood: Secondary | ICD-10-CM | POA: Insufficient documentation

## 2017-07-31 DIAGNOSIS — Z888 Allergy status to other drugs, medicaments and biological substances status: Secondary | ICD-10-CM | POA: Insufficient documentation

## 2017-07-31 DIAGNOSIS — F411 Generalized anxiety disorder: Secondary | ICD-10-CM | POA: Insufficient documentation

## 2017-07-31 DIAGNOSIS — E118 Type 2 diabetes mellitus with unspecified complications: Secondary | ICD-10-CM

## 2017-07-31 DIAGNOSIS — F419 Anxiety disorder, unspecified: Secondary | ICD-10-CM | POA: Diagnosis present

## 2017-07-31 DIAGNOSIS — Z9842 Cataract extraction status, left eye: Secondary | ICD-10-CM | POA: Insufficient documentation

## 2017-07-31 DIAGNOSIS — M549 Dorsalgia, unspecified: Secondary | ICD-10-CM

## 2017-07-31 DIAGNOSIS — R21 Rash and other nonspecific skin eruption: Secondary | ICD-10-CM | POA: Insufficient documentation

## 2017-07-31 DIAGNOSIS — Z23 Encounter for immunization: Secondary | ICD-10-CM | POA: Insufficient documentation

## 2017-07-31 DIAGNOSIS — Z825 Family history of asthma and other chronic lower respiratory diseases: Secondary | ICD-10-CM | POA: Insufficient documentation

## 2017-07-31 DIAGNOSIS — Z9841 Cataract extraction status, right eye: Secondary | ICD-10-CM | POA: Diagnosis not present

## 2017-07-31 DIAGNOSIS — Z87311 Personal history of (healed) other pathological fracture: Secondary | ICD-10-CM | POA: Diagnosis not present

## 2017-07-31 DIAGNOSIS — Z7951 Long term (current) use of inhaled steroids: Secondary | ICD-10-CM | POA: Diagnosis not present

## 2017-07-31 DIAGNOSIS — M818 Other osteoporosis without current pathological fracture: Secondary | ICD-10-CM | POA: Diagnosis not present

## 2017-07-31 DIAGNOSIS — E559 Vitamin D deficiency, unspecified: Secondary | ICD-10-CM | POA: Insufficient documentation

## 2017-07-31 DIAGNOSIS — Z90710 Acquired absence of both cervix and uterus: Secondary | ICD-10-CM | POA: Diagnosis not present

## 2017-07-31 DIAGNOSIS — Z823 Family history of stroke: Secondary | ICD-10-CM | POA: Insufficient documentation

## 2017-07-31 DIAGNOSIS — G8929 Other chronic pain: Secondary | ICD-10-CM | POA: Diagnosis not present

## 2017-07-31 DIAGNOSIS — T380X5A Adverse effect of glucocorticoids and synthetic analogues, initial encounter: Secondary | ICD-10-CM | POA: Diagnosis not present

## 2017-07-31 DIAGNOSIS — Z79899 Other long term (current) drug therapy: Secondary | ICD-10-CM | POA: Diagnosis not present

## 2017-07-31 DIAGNOSIS — Z6841 Body Mass Index (BMI) 40.0 and over, adult: Secondary | ICD-10-CM | POA: Insufficient documentation

## 2017-07-31 DIAGNOSIS — Z8249 Family history of ischemic heart disease and other diseases of the circulatory system: Secondary | ICD-10-CM | POA: Insufficient documentation

## 2017-07-31 DIAGNOSIS — G4733 Obstructive sleep apnea (adult) (pediatric): Secondary | ICD-10-CM | POA: Diagnosis not present

## 2017-07-31 DIAGNOSIS — Z7982 Long term (current) use of aspirin: Secondary | ICD-10-CM | POA: Insufficient documentation

## 2017-07-31 DIAGNOSIS — D352 Benign neoplasm of pituitary gland: Secondary | ICD-10-CM | POA: Diagnosis not present

## 2017-07-31 DIAGNOSIS — Z833 Family history of diabetes mellitus: Secondary | ICD-10-CM | POA: Diagnosis not present

## 2017-07-31 DIAGNOSIS — I10 Essential (primary) hypertension: Secondary | ICD-10-CM | POA: Diagnosis not present

## 2017-07-31 DIAGNOSIS — E119 Type 2 diabetes mellitus without complications: Secondary | ICD-10-CM | POA: Insufficient documentation

## 2017-07-31 DIAGNOSIS — Z794 Long term (current) use of insulin: Secondary | ICD-10-CM | POA: Insufficient documentation

## 2017-07-31 LAB — POCT GLYCOSYLATED HEMOGLOBIN (HGB A1C): Hemoglobin A1C: 6.5

## 2017-07-31 LAB — GLUCOSE, POCT (MANUAL RESULT ENTRY): POC Glucose: 227 mg/dl — AB (ref 70–99)

## 2017-07-31 MED ORDER — HYDROXYZINE HCL 25 MG PO TABS: 25.0000 mg | ORAL_TABLET | Freq: Every evening | ORAL | 1 refills | Status: DC | PRN

## 2017-07-31 MED ORDER — CLONAZEPAM 0.5 MG PO TABS: 0.2500 mg | ORAL_TABLET | Freq: Every day | ORAL | 0 refills | Status: DC

## 2017-07-31 MED ORDER — METHOCARBAMOL 750 MG PO TABS: 750.0000 mg | ORAL_TABLET | Freq: Two times a day (BID) | ORAL | 2 refills | Status: DC | PRN

## 2017-07-31 MED ORDER — TRIAMCINOLONE ACETONIDE 0.1 % EX CREA: 1.0000 "application " | TOPICAL_CREAM | Freq: Two times a day (BID) | CUTANEOUS | 0 refills | Status: DC

## 2017-07-31 NOTE — Progress Notes (Signed)
Patient ID: Raven Turner, female    DOB: Jul 18, 1971  MRN: [id number]  CC: No chief complaint on file.   Subjective: Raven Turner is a 46 y.o. female who presents for 2 mth f/u chronic ds management. 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 On tail end of having a cold  2. Anxiety: Clonazepam dec from 0.5 mg BID to 0.5 mg on last visit. She did well with decrease. Has 3 pills left.  States she is taking it mainly at nights now to help with sleep.   3. Request RF on Robaxin for muscle spasms in back. Takes 1-2 x a day. Last rxn 06/27/2017 for 90 tabs.  Still followed by pain specialist.  On buprenorphine patch  4. DM: saw her endocrinologist 3 wks ago. No changes in meds.  Her A1C was 6.7.  -doing ok in controlling amount she eats -not very active due to pain in her back even with pain med -due for eye exam. Wears bifocal. Problem seeing close up  5. HTN: compliant with Verapamil, Clonidine and Losartan  6.  History of anemia.  She is on iron supplement.  Requested to have blood checked today for her levels.  Denies any dizziness or shortness of breath.  Patient Active Problem List   Diagnosis Date Noted  . Pituitary microadenoma ([place]) 03/13/2017  . Elevated prolactin level ([place]) 01/19/2017  . Microalbuminuria due to type 2 diabetes mellitus ([place]) 12/19/2016  . Elevated TSH 12/19/2016  . Menstrual changes 12/19/2016  . Flaking of scalp 07/18/2016  . Acral type peeling skin syndrome 07/18/2016  . Non-traumatic compression fracture of vertebral column ([place]) 01/20/2016  . Recurrent abdominal pain 01/11/2016  . Bilateral carpal tunnel syndrome 12/21/2015  . Chronic back pain 11/05/2015  . NASH (nonalcoholic steatohepatitis)  09/25/2015  . Pruritic erythematous rash 07/17/2015  . Bilateral hand numbness 06/25/2015  . Vision changes 06/25/2015  . OSA (obstructive sleep apnea) 05/20/2015  . Generalized anxiety disorder 03/27/2015  . Pain due to onychomycosis of toenail 03/27/2015  . Breast pain, left 03/27/2015  . Bone pain 02/27/2015  . Callus of heel 12/16/2014  . 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 Hx of cataract surgery 11/03/2014  . Diastolic dysfunction without heart failure 10/14/2014  . Morbid obesity ([place]) 09/08/2014  . Non-English speaking patient 09/08/2014  . Upper airway cough syndrome 09/05/2014  . Asthma ? severity vs VCD/ pseudo asthma 09/05/2014  . Vitamin D deficiency 08/28/2014  . Compression fracture of T12 vertebra ([place]) 08/28/2014  . Diabetes mellitus type 2, uncontrolled ([place]) 08/26/2014  . Compression fracture of L1 lumbar vertebra ([facility]) 08/26/2014  . Essential (primary) hypertension   . Secondary Cushing's syndrome/ iatrogenic   . 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     Current Outpatient Medications on File Prior to Visit  Medication Sig Dispense Refill  . Albiglutide (TANZEUM) 50 MG PEN Inject 50 mg into the skin once a week. 4 each 3  . albuterol (PROVENTIL HFA;VENTOLIN HFA) 108 (90 Base) MCG/ACT inhaler Inhale 2 puffs into the lungs every 4 (four) hours as needed for wheezing or shortness of breath (((PLAN B))).    Marland Kitchen albuterol (PROVENTIL) (2.5 MG/3ML) 0.083% nebulizer solution USE 1 VIAL VIA NEBULIZER EVERY 4 HOURS AS NEEDED FOR WHEEZING OR SHORTNESS OF BREATH 90 mL 0  . aspirin EC 81  MG tablet Take 1 tablet (81 mg total) by mouth daily. 90 tablet 3  . atorvastatin (LIPITOR) 20 MG tablet Take 1 tablet (20 mg total) by mouth daily. 90 tablet 0  . Blood Glucose Monitoring Suppl (ACCU-CHEK AVIVA PLUS) W/DEVICE KIT Used as directed 1 kit 0  . buprenorphine (BUTRANS - DOSED MCG/HR) 5 MCG/HR PTWK patch Place 5 mcg onto the skin once a week.    . cabergoline (DOSTINEX) 0.5 MG tablet  Take 0.25 mg by mouth 2 (two) times a week.    . cholecalciferol (VITAMIN D) 1000 UNITS tablet Take 1,000 Units by mouth every morning.    . clonazePAM (KLONOPIN) 0.5 MG tablet TAKE 1 TABLET BY MOUTH TWICE DAILY AS NEEDED FOR ANXIETY OR SLEEP 60 tablet 0  . clonazePAM (KLONOPIN) 0.5 MG tablet Take 1 tablet (0.5 mg total) by mouth daily as needed for anxiety. 30 tablet 0  . cloNIDine (CATAPRES) 0.2 MG tablet Take 1 tablet (0.2 mg total) by mouth 3 (three) times daily. 90 tablet 2  . clotrimazole-betamethasone (LOTRISONE) cream Apply to affected area 2 times daily prn 45 g 0  . dextromethorphan-guaiFENesin (MUCINEX DM) 30-600 MG 12hr tablet Take 1 tablet by mouth 2 (two) times daily as needed (with flutter valve for cough and congestion). Reported on 04/01/2016    . ferrous sulfate 325 (65 FE) MG tablet Take 1 tablet (325 mg total) by mouth daily with breakfast. 30 tablet 3  . FOLIC ACID PO Take 1 tablet by mouth every morning.    . furosemide (LASIX) 20 MG tablet Take 1 tablet (20 mg total) by mouth every morning. 30 tablet 2  . gabapentin (NEURONTIN) 400 MG capsule Take 2 capsules (800 mg total) by mouth 3 (three) times daily. 180 capsule 3  . glucose blood (RELION GLUCOSE TEST STRIPS) test strip 1 each by Other route 4 (four) times daily. ICD code E11.65 400 each 3  . HUMULIN R U-500 KWIKPEN 500 UNIT/ML kwikpen Inject 120-180 Units into the skin 3 (three) times daily. 180 U before breakfast, 160 U before lunch, 140 U before dinner 27 mL 11  . Insulin Pen Needle (B-D ULTRAFINE III SHORT PEN) 31G X 8 MM MISC 1 application by Does not apply route daily. 100 each 3  . Insulin Syringe-Needle U-100 (BD INSULIN SYRINGE ULTRAFINE) 31G X 15/64" 1 ML MISC 1 each by Does not apply route 3 (three) times daily. 300 each 3  . Lancet Devices (ACCU-CHEK SOFTCLIX) lancets Use as instructed 1 each 0  . levothyroxine (SYNTHROID, LEVOTHROID) 25 MCG tablet Take 25 mcg by mouth daily.  3  . losartan (COZAAR) 100 MG  tablet Take 1 tablet (100 mg total) by mouth every morning. 90 tablet 3  . metFORMIN (GLUCOPHAGE) 1000 MG tablet TAKE 1 TABLET BY MOUTH TWICE DAILY WITH A MEAL 60 tablet 2  . methocarbamol (ROBAXIN) 750 MG tablet TAKE ONE TABLET BY MOUTH EVERY EIGHT HOURS AS NEEDED FOR MUSCLE SPASMS 90 tablet 0  . methylphenidate (RITALIN LA) 10 MG 24 hr capsule Take 10 mg by mouth daily.    . metoCLOPramide (REGLAN) 5 MG tablet Take 5 mg by mouth 4 (four) times daily -  with meals and at bedtime.  5  . Misc. Devices (CANE) MISC 1 each by Does not apply route daily. 1 each 0  . mometasone-formoterol (DULERA) 200-5 MCG/ACT AERO Inhale 2 puffs into the lungs 2 (two) times daily. 1 Inhaler 5  . montelukast (SINGULAIR) 10 MG tablet Take 1  tablet (10 mg total) at bedtime by mouth. 90 tablet 0  . mupirocin ointment (BACTROBAN) 2 % Place 1 application into the nose 2 (two) times daily. 22 g 0  . omeprazole (PRILOSEC) 20 MG capsule TAKE ONE CAPSULE BY MOUTH EVERY DAY BEFORE BREAKFAST 90 capsule 0  . Oyster Shell Calcium 500 MG TABS Take 1 tablet (1,250 mg total) by mouth daily. 90 tablet 3  . PARoxetine (PAXIL) 20 MG tablet Take 1 tablet (20 mg total) by mouth daily. 90 tablet 0  . predniSONE (DELTASONE) 1 MG tablet Take 1 tablet (1 mg total) by mouth daily with breakfast. 30 tablet 2  . ranitidine (ZANTAC) 300 MG tablet TAKE 1 TABLET BY MOUTH EVERY NIGHT AT BEDTIME 30 tablet 5  . ReliOn Ultra Thin Lancets MISC 1 each by Does not apply route 4 (four) times daily. ICD code E11.65 400 each 3  . Respiratory Therapy Supplies (FLUTTER) DEVI Use as directed 1 each 0  . Spacer/Aero-Holding Chambers (AEROCHAMBER MV) inhaler Use as instructed 1 each 0  . ULTICARE MICRO PEN NEEDLES 32G X 4 MM MISC USE AS DIRECTED 100 each 2  . verapamil (CALAN-SR) 180 MG CR tablet Take 2 tablets (360 mg total) by mouth at bedtime. 180 tablet 3   Current Facility-Administered Medications on File Prior to Visit  Medication Dose Route Frequency  Provider Last Rate Last Dose  . betamethasone acetate-betamethasone sodium phosphate (CELESTONE) injection 3 mg  3 mg Intramuscular Once Daylene Katayama M, DPM      . DOBUTamine (DOBUTREX) 1,000 mcg/mL in dextrose 5% 250 mL infusion  30 mcg/kg/min Intravenous Continuous Croitoru, Mihai, MD 145.3 mL/hr at 04/22/15 1300 30 mcg/kg/min at 04/22/15 1300    Allergies  Allergen Reactions  . Shellfish Allergy Anaphylaxis  . Septra [Sulfamethoxazole-Trimethoprim] Rash, Other (See Comments) and Itching    Facial redness with pruritus  Facial redness with pruritus   . Hydrocortisone Rash    Social History   Socioeconomic History  . Marital status: Married    Spouse name: Not on file  . Number of children: Not on file  . Years of education: Not on file  . Highest education level: Not on file  Social Needs  . Financial resource strain: Not on file  . Food insecurity - worry: Not on file  . Food insecurity - inability: Not on file  . Transportation needs - medical: Not on file  . Transportation needs - non-medical: Not on file  Occupational History  . Not on file  Tobacco Use  . Smoking status: Never Smoker  . Smokeless tobacco: Never Used  Substance and Sexual Activity  . Alcohol use: No    Alcohol/week: 0.0 oz  . Drug use: No  . Sexual activity: Yes    Birth control/protection: Surgical  Other Topics Concern  . Not on file  Social History Narrative   Lives at home with husband.  No children   She does not work   Highest level of education:  12th grade    Family History  Problem Relation Age of Onset  . Diabetes Mother   . Stroke Father   . Asthma Father   . Hypertension Sister   . Hypertension Brother     Past Surgical History:  Procedure Laterality Date  . CATARACT EXTRACTION Bilateral 2014  . HAND SURGERY Right 12/18/2015  . VAGINAL HYSTERECTOMY      ROS: Review of Systems Complains of dry peeling rash on the tip of thumb and index finger  on the right hand.  Uses  moisturizing lotion PHYSICAL EXAM: BP 119/71 (BP Location: Left Arm, Patient Position: Sitting, Cuff Size: Large)   Pulse 78   Temp 98.1 F (36.7 C) (Oral)   Resp 18   Ht 4' (1.219 m)   Wt 193 lb (87.5 kg)   SpO2 95%   BMI 58.89 kg/m   Wt Readings from Last 3 Encounters:  07/31/17 193 lb (87.5 kg)  05/29/17 193 lb 9.6 oz (87.8 kg)  05/22/17 195 lb (88.5 kg)    Physical Exam  General appearance - alert, well appearing, and in no distress Mental status - alert, oriented to person, place, and time, normal mood, behavior, speech, dress, motor activity, and thought processes Chest - clear to auscultation, no wheezes, rales or rhonchi, symmetric air entry Heart - normal rate, regular rhythm, normal S1, S2, no murmurs, rubs, clicks or gallops Extremities - peripheral pulses normal, no pedal edema, no clubbing or cyanosis Skin -mild cracking at the tip of the right index and thumb fingers  Results for orders placed or performed in visit on 07/31/17  Glucose (CBG)  Result Value Ref Range   POC Glucose 227 (A) 70 - 99 mg/dl  HgB A1c  Result Value Ref Range   Hemoglobin A1C 6.5    Lab Results  Component Value Date   WBC 10.3 01/30/2017   HGB 10.7 (L) 01/30/2017   HCT 33.0 (L) 01/30/2017   MCV 75 (L) 01/30/2017   PLT 446 (H) 01/30/2017    ASSESSMENT AND PLAN: 1. Generalized anxiety disorder Continue to wean clonazepam.  we will have her do half a tablet daily for the next 30 days then stop -We will then changed to hydroxyzine 25 mg at bedtime as needed for anxiety and insomnia - clonazePAM (KLONOPIN) 0.5 MG tablet; Take 0.5 tablets (0.25 mg total) daily by mouth.  Dispense: 15 tablet; Refill: 0  2. Essential (primary) hypertension At goal.  Continue current medications  3. Controlled type 2 diabetes mellitus with complication, with long-term current use of insulin ([facility]) Controlled.  Continue current medications.  Followed by endocrinology - Ambulatory referral to  Ophthalmology - Glucose (CBG) - HgB A1c  4. Chronic back pain, unspecified back location, unspecified back pain laterality - methocarbamol (ROBAXIN) 750 MG tablet; Take 1 tablet (750 mg total) 2 (two) times daily as needed by mouth for muscle spasms. TAKE ONE TABLET BY MOUTH EVERY EIGHT HOURS AS NEEDED FOR MUSCLE SPASMS  Dispense: 60 tablet; Refill: 2  5. Iron deficiency anemia, unspecified iron deficiency anemia type - Hemoglobin - Hematocrit  6. Need for vaccination against Streptococcus pneumoniae - Pneumococcal polysaccharide vaccine 23-valent greater than or equal to 2yo subcutaneous/IM  7. Rash Triamcinolone cream  Patient was given the opportunity to ask questions.  Patient verbalized understanding of the plan and was able to repeat key elements of the plan.  [employer] interpreter used during this encounter.  No orders of the defined types were placed in this encounter.    Requested Prescriptions    No prescriptions requested or ordered in this encounter    F/u in 3 mths Karle Plumber, MD, Rosalita Chessman

## 2017-08-01 LAB — HEMATOCRIT: Hematocrit: 30.8 % — ABNORMAL LOW (ref 34.0–46.6)

## 2017-08-01 LAB — HEMOGLOBIN: Hemoglobin: 10.1 g/dL — ABNORMAL LOW (ref 11.1–15.9)

## 2017-08-11 ENCOUNTER — Ambulatory Visit: Payer: Medicaid Other | Admitting: Pulmonary Disease

## 2017-08-11 ENCOUNTER — Encounter: Payer: Self-pay | Admitting: Pulmonary Disease

## 2017-08-11 VITALS — BP 122/72 | HR 60 | Ht <= 58 in | Wt 190.8 lb

## 2017-08-11 DIAGNOSIS — K219 Gastro-esophageal reflux disease without esophagitis: Secondary | ICD-10-CM

## 2017-08-11 DIAGNOSIS — M818 Other osteoporosis without current pathological fracture: Secondary | ICD-10-CM

## 2017-08-11 DIAGNOSIS — J45998 Other asthma: Secondary | ICD-10-CM | POA: Diagnosis not present

## 2017-08-11 DIAGNOSIS — T380X5A Adverse effect of glucocorticoids and synthetic analogues, initial encounter: Secondary | ICD-10-CM | POA: Diagnosis not present

## 2017-08-11 DIAGNOSIS — R058 Other specified cough: Secondary | ICD-10-CM

## 2017-08-11 DIAGNOSIS — R05 Cough: Secondary | ICD-10-CM

## 2017-08-11 MED ORDER — PREDNISONE 10 MG PO TABS: 20.0000 mg | ORAL_TABLET | Freq: Every day | ORAL | 0 refills | Status: DC

## 2017-08-11 NOTE — Patient Instructions (Addendum)
Asthma: I am going to prescribe a prescription for prednisone for you to have in case you get sick when you travel Otherwise, continue taking 1 mg every other day until you return to clinic. Continue Dulera 2 puffs twice a day Continue Singulair  Gastroesophageal reflux disease: Continue taking Zantac  If you have a cold I recommend the following medicines: In the event of sinus congestion use saline rinses, Zyrtec, and phenylephrine tablets If you have thick mucus which is difficult to clear use guaifenesin If you have a cough I recommend that you take dextromethorphan (Delsym) twice a day  We will see you back in January with a nurse practitioner and at that visit we will continue to decrease the dose of your prednisone.

## 2017-08-11 NOTE — Progress Notes (Signed)
Subjective:    Patient ID: Raven Turner, female    DOB: 02/21/1971, 46 y.o.   MRN: [id number]  Synopsis: Former patient of Dr. Melvyn Novas with recurrent wheeze, shortness of breath and cough felt to be due to either vocal cord dysfunction or asthma.   Evaluated by [facility] in 2017, no paradoxical movements of the vocal cords noted   HPI Chief Complaint  Patient presents with  . Follow-up    Pt is doing well overall.   Since the last visit Raven Turner has been doing better.  She has not had a severe exacerbation of asthma.  She did have a cold approximately 2 weeks ago when she had a lot of sinus congestion and some shortness of breath and fever.  She said that the symptoms resolved spontaneously.  She took a few over-the-counter medications but she cannot remember the names.  She typically takes an albuterol prior to bed.  She uses albuterol rescue inhaler from time to time but she has not been using that very often recently.  She continues to take [facility].  She has lost 30 pounds since last visit.  She uses 1 mg of prednisone every other day.  She would prefer to not decrease the dose of this further until she returns from her trip to [place] in December.  Past Medical History:  Diagnosis Date  . Anemia   . Asthma   . Bilateral hand numbness 06/25/2015  . Bone pain 02/27/2015   Diffuse bone pain in legs mostly but also in arms    . Breast pain, left 03/27/2015  . Callus of heel 12/16/2014  . Compression fracture   . Compression fracture of L1 lumbar vertebra (HCC) 08/26/2014  . Compression fracture of T12 vertebra ([place]) 08/28/2014  . COPD (chronic obstructive pulmonary disease) ([place]) 2013  . Cushing syndrome ([place]) 2013   . Depression   . Diabetes ([place]) 2013   . Diabetes mellitus type 2, uncontrolled ([place]) 08/26/2014   Sees Dr. Katy Fitch.  Last visit 11/27/14.  No diabetic retinopathy    . Diabetes mellitus with neuropathy ([place])   . Diastolic dysfunction  without heart failure 10/14/2014   Grade 2 diastolic dysfunction 2/2 pulm HTN   . Generalized anxiety disorder 03/27/2015  . HTN (hypertension) 2005   . Hx of cataract surgery 11/03/2014  . LUQ abdominal pain 07/17/2015  . Medication management 05/12/2015  . Morbid obesity ([place]) 09/08/2014  . 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 Non-English speaking patient 09/08/2014  . OSA (obstructive sleep apnea) 05/20/2015  . Osteopenia 11/19/2013   Dx with osteopenia in lumbar vertebra with partial compression of L1  . Pain due to onychomycosis of toenail 03/27/2015  . Secondary Cushing's syndrome/ iatrogenic   . Skin rash 07/17/2015  . Tachycardia 01/13/2015  . Upper airway cough syndrome 09/05/2014   Followed in Pulmonary clinic/ [facility]/ [place]  - Trial off advair      09/05/2014 >>>  - trial off theoph 09/17/2014 and added flutter  - rec reduce dulera to 100 2 bid 07/13/2015     . Vision changes 06/25/2015  . Vitamin D deficiency 08/28/2014        Review of Systems  Constitutional: Negative for chills, fatigue and fever.  HENT: Negative for postnasal drip, rhinorrhea and sinus pressure.   Respiratory: Negative for cough, shortness of breath and wheezing.   Cardiovascular: Negative for chest pain, palpitations and leg swelling.       Objective:  Physical Exam Vitals:   08/11/17 1155  BP: 122/72  Pulse: 60  SpO2: 99%  Weight: 190 lb 12.8 oz (86.5 kg)  Height: _0  (1.473 m)  RA  Gen: well appearing HENT: OP clear, TM's clear, neck supple PULM: CTA B, normal percussion CV: RRR, no mgr, trace edema GI: BS+, soft, nontender Derm: no cyanosis or rash Psyche: normal mood and affect   PFT 11/03/14  PFTs p am dulera 100 on 30 mg prednisone  FEV1 1.56(73%) ratio 85 =  No obst, including fef 25-75%, only abn is reduction in ERV spirometry 03/31/15 1.48 (62%) ratio 82  p am dulera and while symptomatic requesting saba  July 2017 IgE 55 (normal), CBC without eosinophils  Records  reviewed where she saw our nurse practitioner a few weeks ago and was advised to continue decreasing the dose of prednisone      Assessment & Plan:  Asthma ? severity vs VCD/ pseudo asthma  Steroid-induced osteoporosis  Upper airway cough syndrome  Gastroesophageal reflux disease, esophagitis presence not specified  Discussion: I am pleased with her progress so far as we are continuing to try to wean her off of the chronic prednisone she has been taking for over a decade.  She is lost 30 pounds and her symptoms have improved.  She is taking prednisone every other day.  She had a mild cold but she did not have a flareup requiring prednisone burst.  She wants to continue to decrease the dose of prednisone but she wants to wait until after she returns from her visit to [place] in December.  I think this is fine.  As stated previously I feel that most of her symptoms are vocal cord related dysfunction exacerbated by gastroesophageal reflux disease and postnasal drip.  Today I reviewed my favorite medicines which she should take in the event of a cold.  When she comes back to see [place] in January I think we should decrease the dose of prednisone to 1 mg every third day for 2 weeks and then stop altogether.  Plan: Asthma: I am going to prescribe a prescription for prednisone for you to have in case you get sick when you travel Otherwise, continue taking 1 mg every other day until you return to clinic. Continue Dulera 2 puffs twice a day Continue Singulair  Gastroesophageal reflux disease: Continue taking Zantac  If you have a cold I recommend the following medicines: In the event of sinus congestion use saline rinses, Zyrtec, and phenylephrine tablets If you have thick mucus which is difficult to clear use guaifenesin If you have a cough I recommend that you take dextromethorphan (Delsym) twice a day  We will see you back in January with a nurse practitioner and at that visit we will  continue to decrease the dose of your prednisone.  > 50% of this 28 minute visit spent face to face   Current Outpatient Medications:  .  Albiglutide (TANZEUM) 50 MG PEN, Inject 50 mg into the skin once a week., Disp: 4 each, Rfl: 3 .  albuterol (PROVENTIL HFA;VENTOLIN HFA) 108 (90 Base) MCG/ACT inhaler, Inhale 2 puffs into the lungs every 4 (four) hours as needed for wheezing or shortness of breath (((PLAN B)))., Disp: , Rfl:  .  albuterol (PROVENTIL) (2.5 MG/3ML) 0.083% nebulizer solution, USE 1 VIAL VIA NEBULIZER EVERY 4 HOURS AS NEEDED FOR WHEEZING OR SHORTNESS OF BREATH, Disp: 90 mL, Rfl: 0 .  aspirin EC 81 MG tablet, Take 1 tablet (81  mg total) by mouth daily., Disp: 90 tablet, Rfl: 3 .  atorvastatin (LIPITOR) 20 MG tablet, Take 1 tablet (20 mg total) by mouth daily., Disp: 90 tablet, Rfl: 0 .  Blood Glucose Monitoring Suppl (ACCU-CHEK AVIVA PLUS) W/DEVICE KIT, Used as directed, Disp: 1 kit, Rfl: 0 .  buprenorphine (BUTRANS - DOSED MCG/HR) 5 MCG/HR PTWK patch, Place 5 mcg onto the skin once a week., Disp: , Rfl:  .  cabergoline (DOSTINEX) 0.5 MG tablet, Take 0.25 mg by mouth 2 (two) times a week., Disp: , Rfl:  .  cholecalciferol (VITAMIN D) 1000 UNITS tablet, Take 1,000 Units by mouth every morning., Disp: , Rfl:  .  clonazePAM (KLONOPIN) 0.5 MG tablet, Take 1 tablet (0.5 mg total) by mouth daily as needed for anxiety., Disp: 30 tablet, Rfl: 0 .  clonazePAM (KLONOPIN) 0.5 MG tablet, Take 0.5 tablets (0.25 mg total) daily by mouth., Disp: 15 tablet, Rfl: 0 .  cloNIDine (CATAPRES) 0.2 MG tablet, Take 1 tablet (0.2 mg total) by mouth 3 (three) times daily., Disp: 90 tablet, Rfl: 2 .  clotrimazole-betamethasone (LOTRISONE) cream, Apply to affected area 2 times daily prn, Disp: 45 g, Rfl: 0 .  dextromethorphan-guaiFENesin (MUCINEX DM) 30-600 MG 12hr tablet, Take 1 tablet by mouth 2 (two) times daily as needed (with flutter valve for cough and congestion). Reported on 04/01/2016, Disp: , Rfl:  .   ferrous sulfate 325 (65 FE) MG tablet, Take 1 tablet (325 mg total) by mouth daily with breakfast., Disp: 30 tablet, Rfl: 3 .  FOLIC ACID PO, Take 1 tablet by mouth every morning., Disp: , Rfl:  .  furosemide (LASIX) 20 MG tablet, Take 1 tablet (20 mg total) by mouth every morning., Disp: 30 tablet, Rfl: 2 .  gabapentin (NEURONTIN) 400 MG capsule, Take 2 capsules (800 mg total) by mouth 3 (three) times daily., Disp: 180 capsule, Rfl: 3 .  glucose blood (RELION GLUCOSE TEST STRIPS) test strip, 1 each by Other route 4 (four) times daily. ICD code E11.65, Disp: 400 each, Rfl: 3 .  HUMULIN R U-500 KWIKPEN 500 UNIT/ML kwikpen, Inject 120-180 Units into the skin 3 (three) times daily. 180 U before breakfast, 160 U before lunch, 140 U before dinner, Disp: 27 mL, Rfl: 11 .  [START ON 08/25/2017] hydrOXYzine (ATARAX/VISTARIL) 25 MG tablet, Take 1 tablet (25 mg total) at bedtime as needed by mouth for anxiety. Start after you finish current Clonazepam prescription, Disp: 30 tablet, Rfl: 1 .  Insulin Pen Needle (B-D ULTRAFINE III SHORT PEN) 31G X 8 MM MISC, 1 application by Does not apply route daily., Disp: 100 each, Rfl: 3 .  Insulin Syringe-Needle U-100 (BD INSULIN SYRINGE ULTRAFINE) 31G X 15/64" 1 ML MISC, 1 each by Does not apply route 3 (three) times daily., Disp: 300 each, Rfl: 3 .  Lancet Devices (ACCU-CHEK SOFTCLIX) lancets, Use as instructed, Disp: 1 each, Rfl: 0 .  levothyroxine (SYNTHROID, LEVOTHROID) 25 MCG tablet, Take 25 mcg by mouth daily., Disp: , Rfl: 3 .  losartan (COZAAR) 100 MG tablet, Take 1 tablet (100 mg total) by mouth every morning., Disp: 90 tablet, Rfl: 3 .  metFORMIN (GLUCOPHAGE) 1000 MG tablet, TAKE 1 TABLET BY MOUTH TWICE DAILY WITH A MEAL, Disp: 60 tablet, Rfl: 2 .  methocarbamol (ROBAXIN) 750 MG tablet, Take 1 tablet (750 mg total) 2 (two) times daily as needed by mouth for muscle spasms. TAKE ONE TABLET BY MOUTH EVERY EIGHT HOURS AS NEEDED FOR MUSCLE SPASMS, Disp: 60 tablet, Rfl:  2 .  methylphenidate (RITALIN LA) 10 MG 24 hr capsule, Take 10 mg by mouth daily., Disp: , Rfl:  .  metoCLOPramide (REGLAN) 5 MG tablet, Take 5 mg by mouth 4 (four) times daily -  with meals and at bedtime., Disp: , Rfl: 5 .  Misc. Devices (CANE) MISC, 1 each by Does not apply route daily., Disp: 1 each, Rfl: 0 .  mometasone-formoterol (DULERA) 200-5 MCG/ACT AERO, Inhale 2 puffs into the lungs 2 (two) times daily., Disp: 1 Inhaler, Rfl: 5 .  montelukast (SINGULAIR) 10 MG tablet, Take 1 tablet (10 mg total) at bedtime by mouth., Disp: 90 tablet, Rfl: 0 .  mupirocin ointment (BACTROBAN) 2 %, Place 1 application into the nose 2 (two) times daily., Disp: 22 g, Rfl: 0 .  omeprazole (PRILOSEC) 20 MG capsule, TAKE ONE CAPSULE BY MOUTH EVERY DAY BEFORE BREAKFAST, Disp: 90 capsule, Rfl: 0 .  Oyster Shell Calcium 500 MG TABS, Take 1 tablet (1,250 mg total) by mouth daily., Disp: 90 tablet, Rfl: 3 .  PARoxetine (PAXIL) 20 MG tablet, Take 1 tablet (20 mg total) by mouth daily., Disp: 90 tablet, Rfl: 0 .  predniSONE (DELTASONE) 1 MG tablet, Take 1 tablet (1 mg total) by mouth daily with breakfast., Disp: 30 tablet, Rfl: 2 .  ranitidine (ZANTAC) 300 MG tablet, TAKE 1 TABLET BY MOUTH EVERY NIGHT AT BEDTIME, Disp: 30 tablet, Rfl: 5 .  ReliOn Ultra Thin Lancets MISC, 1 each by Does not apply route 4 (four) times daily. ICD code E11.65, Disp: 400 each, Rfl: 3 .  Respiratory Therapy Supplies (FLUTTER) DEVI, Use as directed, Disp: 1 each, Rfl: 0 .  Spacer/Aero-Holding Chambers (AEROCHAMBER MV) inhaler, Use as instructed, Disp: 1 each, Rfl: 0 .  triamcinolone cream (KENALOG) 0.1 %, Apply 1 application 2 (two) times daily topically., Disp: 30 g, Rfl: 0 .  ULTICARE MICRO PEN NEEDLES 32G X 4 MM MISC, USE AS DIRECTED, Disp: 100 each, Rfl: 2 .  verapamil (CALAN-SR) 180 MG CR tablet, Take 2 tablets (360 mg total) by mouth at bedtime., Disp: 180 tablet, Rfl: 3  Current Facility-Administered Medications:  .  betamethasone  acetate-betamethasone sodium phosphate (CELESTONE) injection 3 mg, 3 mg, Intramuscular, Once, Edrick Kins, DPM  Facility-Administered Medications Ordered in Other Visits:  .  DOBUTamine (DOBUTREX) 1,000 mcg/mL in dextrose 5% 250 mL infusion, 30 mcg/kg/min, Intravenous, Continuous, Croitoru, Mihai, MD, Last Rate: 145.3 mL/hr at 04/22/15 1300, 30 mcg/kg/min at 04/22/15 1300

## 2017-08-25 ENCOUNTER — Other Ambulatory Visit: Payer: Self-pay | Admitting: Internal Medicine

## 2017-08-25 DIAGNOSIS — F411 Generalized anxiety disorder: Secondary | ICD-10-CM

## 2017-08-25 DIAGNOSIS — J45998 Other asthma: Secondary | ICD-10-CM

## 2017-08-28 ENCOUNTER — Other Ambulatory Visit: Payer: Self-pay | Admitting: Pharmacist

## 2017-08-28 DIAGNOSIS — G8929 Other chronic pain: Secondary | ICD-10-CM

## 2017-08-28 DIAGNOSIS — M545 Low back pain, unspecified: Secondary | ICD-10-CM

## 2017-08-28 DIAGNOSIS — I10 Essential (primary) hypertension: Secondary | ICD-10-CM

## 2017-08-28 MED ORDER — GABAPENTIN 400 MG PO CAPS: 800.0000 mg | ORAL_CAPSULE | Freq: Three times a day (TID) | ORAL | 0 refills | Status: DC

## 2017-08-28 MED ORDER — VERAPAMIL HCL ER 180 MG PO TBCR: 360.0000 mg | EXTENDED_RELEASE_TABLET | Freq: Every day | ORAL | 0 refills | Status: DC

## 2017-09-13 ENCOUNTER — Ambulatory Visit: Payer: Medicaid Other | Admitting: Adult Health

## 2017-09-13 ENCOUNTER — Encounter: Payer: Self-pay | Admitting: Adult Health

## 2017-09-13 DIAGNOSIS — M818 Other osteoporosis without current pathological fracture: Secondary | ICD-10-CM | POA: Diagnosis not present

## 2017-09-13 DIAGNOSIS — J45998 Other asthma: Secondary | ICD-10-CM

## 2017-09-13 DIAGNOSIS — T380X5A Adverse effect of glucocorticoids and synthetic analogues, initial encounter: Secondary | ICD-10-CM | POA: Diagnosis not present

## 2017-09-13 NOTE — Patient Instructions (Addendum)
Decrease Prednisone 1mg  on Tuesday and Saturday for 2 weeks then stop.  Continue on Dulera 2 puffs Twice daily  , rinse after use.  Ventolin Inhaler 2 puffs every 4hr as needed -this is your rescue inhaler .  Follow up with Dr. Lake Bells in 2 months and As needed   Please contact office for sooner follow up if symptoms do not improve or worsen or seek emergency care

## 2017-09-13 NOTE — Assessment & Plan Note (Signed)
Continue with weight loss. 

## 2017-09-13 NOTE — Assessment & Plan Note (Signed)
Doing well on current regimen We will continue to wean off of prednisone.  Patient is to taper to off over the next 2 weeks. She will continue on Dulera twice daily.  She is to follow-up here in 6-8 weeks or sooner if needed.  Plan  Patient Instructions  Decrease Prednisone 1mg  on Tuesday and Saturday for 2 weeks then stop.  Continue on Dulera 2 puffs Twice daily  , rinse after use.  Ventolin Inhaler 2 puffs every 4hr as needed -this is your rescue inhaler .  Follow up with Dr. Lake Bells in 2 months and As needed   Please contact office for sooner follow up if symptoms do not improve or worsen or seek emergency care

## 2017-09-13 NOTE — Assessment & Plan Note (Signed)
Wean off of prednisone as directed

## 2017-09-13 NOTE — Progress Notes (Signed)
$'@Patient'u$  ID: Raven Turner, female    DOB: 1971/06/10, 47 y.o.   MRN: [id number]  Chief Complaint  Patient presents with  . 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 Onset was childhood but continued to have "good days and bad days" until 2010 and no good days since then even p pred started and no better since arrived in [place] summer 2015 withno evidence of any airflow obst by pfts 11/03/14   TEST  11/03/14 PFTs p am dulera 100 on 30 mg prednisone FEV1 1.56(73%) ratio 85 = No obst, including fef 25-75%, only abn is reduction in ERV DLCO nml  spirometry 03/31/15 1.48 (62%) ratio 82 p am dulera and while symptomatic requesting saba July 2017 IgE 55 (normal), CBC without eosinophils Echo 2016 Grade 2 DD  Sleep study 2016 and 2018 NEG for OSA   09/13/2017 Follow up ; Asthma/VCD Patient returns for a 6-week follow-up.  She says that she is doing well overall.  She denies any flare of cough or wheezing.  She has had steroid dependent asthma for many years.  We have been slowly working on weaning her off of her prednisone.  She is currently on 1 mg every other day.  She denies increased use of albuterol.  She is recently traveled to [place] and says that she did very well.  She remains on Dulera twice daily.  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  Patient has lost over 30 pounds in the last year since decreasing her prednisone.  Interpreter [place]  present for today's visit.   Allergies  Allergen Reactions  . Shellfish Allergy Anaphylaxis  . Septra [Sulfamethoxazole-Trimethoprim] Rash, Other (See Comments) and Itching    Facial redness with pruritus  Facial redness with  pruritus   . Hydrocortisone Rash    Immunization History  Administered Date(s) Administered  . Hepatitis B 03/23/2015, 07/06/2015  . Influenza Split 05/25/2015  . Influenza,inj,Quad PF,6+ Mos 05/13/2016, 05/29/2017  . Pneumococcal Polysaccharide-23 07/31/2017  . Pneumococcal-Unspecified 04/12/2012  . Tdap 06/24/2015    Past Medical History:  Diagnosis Date  . Anemia   . Asthma   . Bilateral hand numbness 06/25/2015  . Bone pain 02/27/2015   Diffuse bone pain in legs mostly but also in arms    . Breast pain, left 03/27/2015  . Callus of heel 12/16/2014  . Compression fracture   . Compression fracture of L1 lumbar vertebra (HCC) 08/26/2014  . Compression fracture of T12 vertebra ([place]) 08/28/2014  . COPD (chronic obstructive pulmonary disease) ([place]) 2013  . Cushing syndrome ([place]) 2013   . Depression   . Diabetes ([place]) 2013   . Diabetes mellitus type 2, uncontrolled ([place]) 08/26/2014   Sees Dr. Katy Fitch.  Last visit 11/27/14.  No diabetic retinopathy    . Diabetes mellitus with neuropathy ([place])   . Diastolic dysfunction without heart failure 10/14/2014   Grade 2 diastolic dysfunction 2/2 pulm HTN   . Generalized anxiety disorder 03/27/2015  . HTN (hypertension) 2005   . Hx of cataract surgery 11/03/2014  . LUQ abdominal pain 07/17/2015  . Medication management 05/12/2015  . Morbid obesity ([place]) 09/08/2014  . 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 Non-English speaking patient 09/08/2014  .  OSA (obstructive sleep apnea) 05/20/2015  . Osteopenia 11/19/2013   Dx with osteopenia in lumbar vertebra with partial compression of L1  . Pain due to onychomycosis of toenail 03/27/2015  . Secondary Cushing's syndrome/ iatrogenic   . Skin rash 07/17/2015  . Tachycardia 01/13/2015  . Upper airway cough syndrome 09/05/2014   Followed in Pulmonary clinic/ [facility]  - Trial off advair      09/05/2014 >>>  - trial off theoph 09/17/2014 and added flutter  - rec reduce dulera to 100 2 bid 07/13/2015       . Vision changes 06/25/2015  . Vitamin D deficiency 08/28/2014    Tobacco History: Social History   Tobacco Use  Smoking Status Never Smoker  Smokeless Tobacco Never Used   Counseling given: Not Answered   Outpatient Encounter Medications as of 09/13/2017  Medication Sig  . Albiglutide (TANZEUM) 50 MG PEN Inject 50 mg into the skin once a week.  Marland Kitchen albuterol (PROVENTIL HFA;VENTOLIN HFA) 108 (90 Base) MCG/ACT inhaler Inhale 2 puffs into the lungs every 4 (four) hours as needed for wheezing or shortness of breath (((PLAN B))).  Marland Kitchen albuterol (PROVENTIL) (2.5 MG/3ML) 0.083% nebulizer solution USE 1 VIAL VIA NEBULIZER EVERY 4 HOURS AS NEEDED FOR WHEEZING OR SHORTNESS OF BREATH  . aspirin EC 81 MG tablet Take 1 tablet (81 mg total) by mouth daily.  Marland Kitchen atorvastatin (LIPITOR) 20 MG tablet Take 1 tablet (20 mg total) by mouth daily.  . Blood Glucose Monitoring Suppl (ACCU-CHEK AVIVA PLUS) W/DEVICE KIT Used as directed  . buprenorphine (BUTRANS - DOSED MCG/HR) 5 MCG/HR PTWK patch Place 5 mcg onto the skin once a week.  . cabergoline (DOSTINEX) 0.5 MG tablet Take 0.25 mg by mouth 2 (two) times a week.  . cholecalciferol (VITAMIN D) 1000 UNITS tablet Take 1,000 Units by mouth every morning.  . cloNIDine (CATAPRES) 0.2 MG tablet Take 1 tablet (0.2 mg total) by mouth 3 (three) times daily.  . clotrimazole-betamethasone (LOTRISONE) cream Apply to affected area 2 times daily prn  . dextromethorphan-guaiFENesin (MUCINEX DM) 30-600 MG 12hr tablet Take 1 tablet by mouth 2 (two) times daily as needed (with flutter valve for cough and congestion). Reported on 04/01/2016  . ferrous sulfate 325 (65 FE) MG tablet Take 1 tablet (325 mg total) by mouth daily with breakfast.  . FOLIC ACID PO Take 1 tablet by mouth every morning.  . furosemide (LASIX) 20 MG tablet Take 1 tablet (20 mg total) by mouth every morning.  . gabapentin (NEURONTIN) 400 MG capsule Take 2 capsules (800 mg total) by mouth 3 (three) times daily.   Marland Kitchen glucose blood (RELION GLUCOSE TEST STRIPS) test strip 1 each by Other route 4 (four) times daily. ICD code E11.65  . HUMULIN R U-500 KWIKPEN 500 UNIT/ML kwikpen Inject 120-180 Units into the skin 3 (three) times daily. 180 U before breakfast, 160 U before lunch, 140 U before dinner  . hydrOXYzine (ATARAX/VISTARIL) 25 MG tablet Take 1 tablet (25 mg total) at bedtime as needed by mouth for anxiety. Start after you finish current Clonazepam prescription  . Insulin Pen Needle (B-D ULTRAFINE III SHORT PEN) 31G X 8 MM MISC 1 application by Does not apply route daily.  . Insulin Syringe-Needle U-100 (BD INSULIN SYRINGE ULTRAFINE) 31G X 15/64" 1 ML MISC 1 each by Does not apply route 3 (three) times daily.  Elmore Guise Devices (ACCU-CHEK SOFTCLIX) lancets Use as instructed  . levothyroxine (SYNTHROID, LEVOTHROID) 25 MCG tablet Take 25 mcg  by mouth daily.  Marland Kitchen losartan (COZAAR) 100 MG tablet Take 1 tablet (100 mg total) by mouth every morning.  . metFORMIN (GLUCOPHAGE) 1000 MG tablet TAKE 1 TABLET BY MOUTH TWICE DAILY WITH A MEAL  . methocarbamol (ROBAXIN) 750 MG tablet Take 1 tablet (750 mg total) 2 (two) times daily as needed by mouth for muscle spasms. TAKE ONE TABLET BY MOUTH EVERY EIGHT HOURS AS NEEDED FOR MUSCLE SPASMS  . Misc. Devices (CANE) MISC 1 each by Does not apply route daily.  . mometasone-formoterol (DULERA) 200-5 MCG/ACT AERO Inhale 2 puffs into the lungs 2 (two) times daily.  . montelukast (SINGULAIR) 10 MG tablet Take 1 tablet (10 mg total) at bedtime by mouth.  . mupirocin ointment (BACTROBAN) 2 % Place 1 application into the nose 2 (two) times daily.  Marland Kitchen omeprazole (PRILOSEC) 20 MG capsule TAKE ONE CAPSULE BY MOUTH EVERY DAY BEFORE BREAKFAST  . Oyster Shell Calcium 500 MG TABS Take 1 tablet (1,250 mg total) by mouth daily.  Marland Kitchen PARoxetine (PAXIL) 20 MG tablet Take 1 tablet (20 mg total) by mouth daily.  . predniSONE (DELTASONE) 10 MG tablet Take 10 mg by mouth daily with breakfast.  .  ranitidine (ZANTAC) 300 MG tablet TAKE 1 TABLET BY MOUTH EVERY NIGHT AT BEDTIME  . ReliOn Ultra Thin Lancets MISC 1 each by Does not apply route 4 (four) times daily. ICD code E11.65  . Respiratory Therapy Supplies (FLUTTER) DEVI Use as directed  . Spacer/Aero-Holding Chambers (AEROCHAMBER MV) inhaler Use as instructed  . triamcinolone cream (KENALOG) 0.1 % Apply 1 application 2 (two) times daily topically.  Marland Kitchen ULTICARE MICRO PEN NEEDLES 32G X 4 MM MISC USE AS DIRECTED  . verapamil (CALAN-SR) 180 MG CR tablet Take 2 tablets (360 mg total) by mouth at bedtime.  . [DISCONTINUED] clonazePAM (KLONOPIN) 0.5 MG tablet Take 1 tablet (0.5 mg total) by mouth daily as needed for anxiety.  . [DISCONTINUED] clonazePAM (KLONOPIN) 0.5 MG tablet Take 0.5 tablets (0.25 mg total) daily by mouth.  . [DISCONTINUED] methylphenidate (RITALIN LA) 10 MG 24 hr capsule Take 10 mg by mouth daily.  . [DISCONTINUED] metoCLOPramide (REGLAN) 5 MG tablet Take 5 mg by mouth 4 (four) times daily -  with meals and at bedtime.  . [DISCONTINUED] predniSONE (DELTASONE) 1 MG tablet Take 1 tablet (1 mg total) by mouth daily with breakfast.  . [DISCONTINUED] predniSONE (DELTASONE) 10 MG tablet Take 2 tablets (20 mg total) by mouth daily with breakfast.   Facility-Administered Encounter Medications as of 09/13/2017  Medication  . betamethasone acetate-betamethasone sodium phosphate (CELESTONE) injection 3 mg  . DOBUTamine (DOBUTREX) 1,000 mcg/mL in dextrose 5% 250 mL infusion     Review of Systems  Constitutional:   No  weight loss, night sweats,  Fevers, chills, fatigue, or  lassitude.  HEENT:   No headaches,  Difficulty swallowing,  Tooth/dental problems, or  Sore throat,                No sneezing, itching, ear ache, nasal congestion, post nasal drip,   CV:  No chest pain,  Orthopnea, PND, swelling in lower extremities, anasarca, dizziness, palpitations, syncope.   GI  No heartburn, indigestion, abdominal pain, nausea,  vomiting, diarrhea, change in bowel habits, loss of appetite, bloody stools.   Resp: No shortness of breath with exertion or at rest.  No excess mucus, no productive cough,  No non-productive cough,  No coughing up of blood.  No change in color of mucus.  No  wheezing.  No chest wall deformity  Skin: no rash or lesions.  GU: no dysuria, change in color of urine, no urgency or frequency.  No flank pain, no hematuria       Physical Exam  BP 130/76   Pulse 75   Ht '4\' 10"'$  (1.473 m)   Wt 192 lb 4 oz (87.2 kg)   SpO2 97%   BMI 40.18 kg/m   GEN: A/Ox3; pleasant , NAD, obese walks with a cane   HEENT:  [place]/AT,  EACs-clear, TMs-wnl, NOSE-clear, THROAT-clear, no lesions, no postnasal drip or exudate noted.   NECK:  Supple w/ fair ROM; no JVD; normal carotid impulses w/o bruits; no thyromegaly or nodules palpated; no lymphadenopathy.    RESP  Clear  P & A; w/o, wheezes/ rales/ or rhonchi. no accessory muscle use, no dullness to percussion  CARD:  RRR, no m/r/g, no peripheral edema, pulses intact, no cyanosis or clubbing.  GI:   Soft & nt; nml bowel sounds; no organomegaly or masses detected.   Musco: Warm bil, no deformities or joint swelling noted.   Neuro: alert, no focal deficits noted.    Skin: Warm, no lesions or rashes    Lab Results:     BNP Imaging: No results found.   Assessment & Plan:   Asthma ? severity vs VCD/ pseudo asthma Doing well on current regimen We will continue to wean off of prednisone.  Patient is to taper to off over the next 2 weeks. She will continue on Dulera twice daily.  She is to follow-up here in 6-8 weeks or sooner if needed.  Plan  Patient Instructions  Decrease Prednisone '1mg'$  on Tuesday and Saturday for 2 weeks then stop.  Continue on Dulera 2 puffs Twice daily  , rinse after use.  Ventolin Inhaler 2 puffs every 4hr as needed -this is your rescue inhaler .  Follow up with Dr. [place] Bells in 2 months and As needed   Please contact  office for sooner follow up if symptoms do not improve or worsen or seek emergency care       Steroid-induced osteoporosis Wean off of prednisone as directed  Morbid obesity ([place]) Continue with weight loss     Rexene Edison, NP 09/13/2017

## 2017-09-13 NOTE — Progress Notes (Signed)
Reviewed, agree 

## 2017-09-27 ENCOUNTER — Telehealth: Payer: Self-pay | Admitting: Internal Medicine

## 2017-09-27 DIAGNOSIS — J45998 Other asthma: Secondary | ICD-10-CM

## 2017-09-27 NOTE — Telephone Encounter (Signed)
Pt called to request a refill on  -montelukast (SINGULAIR) 10 MG tablet  If approved please send to  -[facility] [place] - [address] AT [place] Please follow up

## 2017-09-28 MED ORDER — MONTELUKAST SODIUM 10 MG PO TABS: 10.0000 mg | ORAL_TABLET | Freq: Every day | ORAL | 0 refills | Status: DC

## 2017-09-28 NOTE — Addendum Note (Signed)
Addended by: Rica Mast on: 09/28/2017 08:05 AM   Modules accepted: Orders

## 2017-09-28 NOTE — Telephone Encounter (Signed)
Refilled

## 2017-10-10 ENCOUNTER — Ambulatory Visit (HOSPITAL_COMMUNITY)
Admission: EM | Admit: 2017-10-10 | Discharge: 2017-10-10 | Disposition: A | Discharge status: Home / Self Care | Payer: Medicaid Other | Attending: Family Medicine | Admitting: Family Medicine

## 2017-10-10 ENCOUNTER — Encounter (HOSPITAL_COMMUNITY): Payer: Self-pay | Admitting: Emergency Medicine

## 2017-10-10 DIAGNOSIS — J4531 Mild persistent asthma with (acute) exacerbation: Secondary | ICD-10-CM | POA: Diagnosis not present

## 2017-10-10 MED ORDER — PREDNISONE 10 MG (21) PO TBPK: ORAL_TABLET | Freq: Every day | ORAL | 0 refills | Status: DC

## 2017-10-10 NOTE — ED Triage Notes (Signed)
PRoductive cough and congestion started yesterday,.

## 2017-10-11 ENCOUNTER — Ambulatory Visit (INDEPENDENT_AMBULATORY_CARE_PROVIDER_SITE_OTHER)
Admission: RE | Admit: 2017-10-11 | Discharge: 2017-10-11 | Disposition: A | Discharge status: Home / Self Care | Payer: Medicaid Other | Source: Ambulatory Visit | Attending: Adult Health | Admitting: Adult Health

## 2017-10-11 ENCOUNTER — Other Ambulatory Visit (INDEPENDENT_AMBULATORY_CARE_PROVIDER_SITE_OTHER): Payer: Medicaid Other

## 2017-10-11 ENCOUNTER — Ambulatory Visit: Payer: Medicaid Other | Admitting: Adult Health

## 2017-10-11 ENCOUNTER — Encounter: Payer: Self-pay | Admitting: Adult Health

## 2017-10-11 DIAGNOSIS — J45998 Other asthma: Secondary | ICD-10-CM | POA: Diagnosis not present

## 2017-10-11 DIAGNOSIS — R058 Other specified cough: Secondary | ICD-10-CM

## 2017-10-11 DIAGNOSIS — R05 Cough: Secondary | ICD-10-CM

## 2017-10-11 LAB — CBC WITH DIFFERENTIAL/PLATELET
Basophils Absolute: 0.1 10*3/uL (ref 0.0–0.1)
Basophils Relative: 0.9 % (ref 0.0–3.0)
Eosinophils Absolute: 0.2 10*3/uL (ref 0.0–0.7)
Eosinophils Relative: 1.7 % (ref 0.0–5.0)
HCT: 32 % — ABNORMAL LOW (ref 36.0–46.0)
Hemoglobin: 10.3 g/dL — ABNORMAL LOW (ref 12.0–15.0)
Lymphocytes Relative: 23 % (ref 12.0–46.0)
Lymphs Abs: 2.8 10*3/uL (ref 0.7–4.0)
MCHC: 32.3 g/dL (ref 30.0–36.0)
MCV: 75 fl — ABNORMAL LOW (ref 78.0–100.0)
Monocytes Absolute: 1.1 10*3/uL — ABNORMAL HIGH (ref 0.1–1.0)
Monocytes Relative: 8.8 % (ref 3.0–12.0)
Neutro Abs: 8 10*3/uL — ABNORMAL HIGH (ref 1.4–7.7)
Neutrophils Relative %: 65.6 % (ref 43.0–77.0)
Platelets: 453 10*3/uL — ABNORMAL HIGH (ref 150.0–400.0)
RBC: 4.27 Mil/uL (ref 3.87–5.11)
RDW: 16.9 % — ABNORMAL HIGH (ref 11.5–15.5)
WBC: 12.1 10*3/uL — ABNORMAL HIGH (ref 4.0–10.5)

## 2017-10-11 NOTE — Progress Notes (Signed)
$'@Patient'G$  ID: Raven Turner, female    DOB: Jan 22, 1971, 47 y.o.   MRN: [id number]  Chief Complaint  Patient presents with  . 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 Onset was childhood but continued to have "good days and bad days" until 2010 and no good days since then even p pred started and no better since arrived in [place] summer 2015 withno evidence of any airflow obst by pfts 11/03/14   TEST  11/03/14 PFTs p am dulera 100 on 30 mg prednisone FEV1 1.56(73%) ratio 85 = No obst, including fef 25-75%, only abn is reduction in ERV DLCO nml  spirometry 03/31/15 1.48 (62%) ratio 82 p am dulera and while symptomatic requesting saba July 2017 IgE 55 (normal), CBC without eosinophils Echo 2016 Grade 2 DD Sleep study 2016 and 2018 NEG for OSA  10/11/2017 Acute OV : Asthma  Online Interpretor present for visit.  Pt presents for an acute office visit . She complains of increase cough, wheezing, sore throat , chills  and shortness of breath for days . No fever or discolored mucus .  Remains on Dulera Twice daily  . Uses ventolin inhaler on occasion. Not using any albuterol nebs at home.  Went to ER yesterday , was given prednisone taper but has not started. Trying not to take any prednisone as she just tapered off completely 1 week ago. She has been on long term steroids .     Allergies  Allergen Reactions  . Shellfish Allergy Anaphylaxis  . Septra [Sulfamethoxazole-Trimethoprim] Rash, Other (See Comments) and Itching    Facial redness with pruritus  Facial redness with pruritus   . Hydrocortisone Rash    Immunization History  Administered Date(s) Administered  . Hepatitis B 03/23/2015, 07/06/2015  . Influenza Split  05/25/2015  . Influenza,inj,Quad PF,6+ Mos 05/13/2016, 05/29/2017  . Pneumococcal Polysaccharide-23 07/31/2017  . Pneumococcal-Unspecified 04/12/2012  . Tdap 06/24/2015    Past Medical History:  Diagnosis Date  . Anemia   . Asthma   . Bilateral hand numbness 06/25/2015  . Bone pain 02/27/2015   Diffuse bone pain in legs mostly but also in arms    . Breast pain, left 03/27/2015  . Callus of heel 12/16/2014  . Compression fracture   . Compression fracture of L1 lumbar vertebra (HCC) 08/26/2014  . Compression fracture of T12 vertebra ([place]) 08/28/2014  . COPD (chronic obstructive pulmonary disease) ([place]) 2013  . Cushing syndrome ([place]) 2013   . Depression   . Diabetes ([place]) 2013   . Diabetes mellitus type 2, uncontrolled ([place]) 08/26/2014   Sees Dr. Katy Fitch.  Last visit 11/27/14.  No diabetic retinopathy    . Diabetes mellitus with neuropathy ([place])   . Diastolic dysfunction without heart failure 10/14/2014   Grade 2 diastolic dysfunction 2/2 pulm HTN   . Generalized anxiety disorder 03/27/2015  . HTN (hypertension) 2005   . Hx of cataract surgery 11/03/2014  . LUQ abdominal pain 07/17/2015  . Medication management 05/12/2015  . Morbid obesity ([place]) 09/08/2014  . 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 Non-English speaking patient 09/08/2014  . OSA (obstructive sleep apnea) 05/20/2015  . Osteopenia 11/19/2013   Dx with osteopenia in lumbar vertebra with partial compression of  L1  . Pain due to onychomycosis of toenail 03/27/2015  . Secondary Cushing's syndrome/ iatrogenic   . Skin rash 07/17/2015  . Tachycardia 01/13/2015  . Upper airway cough syndrome 09/05/2014   Followed in Pulmonary clinic/ [facility]/ [facility]  - Trial off advair      09/05/2014 >>>  - trial off theoph 09/17/2014 and added flutter  - rec reduce dulera to 100 2 bid 07/13/2015     . Vision changes 06/25/2015  . Vitamin D deficiency 08/28/2014    Tobacco History: Social History   Tobacco Use  Smoking Status Never Smoker    Smokeless Tobacco Never Used   Counseling given: Not Answered   Outpatient Encounter Medications as of 10/11/2017  Medication Sig  . Albiglutide (TANZEUM) 50 MG PEN Inject 50 mg into the skin once a week.  Marland Kitchen albuterol (PROVENTIL HFA;VENTOLIN HFA) 108 (90 Base) MCG/ACT inhaler Inhale 2 puffs into the lungs every 4 (four) hours as needed for wheezing or shortness of breath (((PLAN B))).  Marland Kitchen albuterol (PROVENTIL) (2.5 MG/3ML) 0.083% nebulizer solution USE 1 VIAL VIA NEBULIZER EVERY 4 HOURS AS NEEDED FOR WHEEZING OR SHORTNESS OF BREATH  . aspirin EC 81 MG tablet Take 1 tablet (81 mg total) by mouth daily.  Marland Kitchen atorvastatin (LIPITOR) 20 MG tablet Take 1 tablet (20 mg total) by mouth daily.  . Blood Glucose Monitoring Suppl (ACCU-CHEK AVIVA PLUS) W/DEVICE KIT Used as directed  . buprenorphine (BUTRANS - DOSED MCG/HR) 5 MCG/HR PTWK patch Place 5 mcg onto the skin once a week.  . cabergoline (DOSTINEX) 0.5 MG tablet Take 0.25 mg by mouth 2 (two) times a week.  . cholecalciferol (VITAMIN D) 1000 UNITS tablet Take 1,000 Units by mouth every morning.  . cloNIDine (CATAPRES) 0.2 MG tablet Take 1 tablet (0.2 mg total) by mouth 3 (three) times daily.  . clotrimazole-betamethasone (LOTRISONE) cream Apply to affected area 2 times daily prn  . dextromethorphan-guaiFENesin (MUCINEX DM) 30-600 MG 12hr tablet Take 1 tablet by mouth 2 (two) times daily as needed (with flutter valve for cough and congestion). Reported on 04/01/2016  . ferrous sulfate 325 (65 FE) MG tablet Take 1 tablet (325 mg total) by mouth daily with breakfast.  . FOLIC ACID PO Take 1 tablet by mouth every morning.  . furosemide (LASIX) 20 MG tablet Take 1 tablet (20 mg total) by mouth every morning.  . gabapentin (NEURONTIN) 400 MG capsule Take 2 capsules (800 mg total) by mouth 3 (three) times daily.  Marland Kitchen glucose blood (RELION GLUCOSE TEST STRIPS) test strip 1 each by Other route 4 (four) times daily. ICD code E11.65  . HUMULIN R U-500 KWIKPEN 500  UNIT/ML kwikpen Inject 120-180 Units into the skin 3 (three) times daily. 180 U before breakfast, 160 U before lunch, 140 U before dinner  . hydrOXYzine (ATARAX/VISTARIL) 25 MG tablet Take 1 tablet (25 mg total) at bedtime as needed by mouth for anxiety. Start after you finish current Clonazepam prescription  . Insulin Pen Needle (B-D ULTRAFINE III SHORT PEN) 31G X 8 MM MISC 1 application by Does not apply route daily.  . Insulin Syringe-Needle U-100 (BD INSULIN SYRINGE ULTRAFINE) 31G X 15/64" 1 ML MISC 1 each by Does not apply route 3 (three) times daily.  Elmore Guise Devices (ACCU-CHEK SOFTCLIX) lancets Use as instructed  . levothyroxine (SYNTHROID, LEVOTHROID) 25 MCG tablet Take 25 mcg by mouth daily.  Marland Kitchen losartan (COZAAR) 100 MG tablet Take 1 tablet (100 mg total) by mouth every morning.  Marland Kitchen  metFORMIN (GLUCOPHAGE) 1000 MG tablet TAKE 1 TABLET BY MOUTH TWICE DAILY WITH A MEAL  . methocarbamol (ROBAXIN) 750 MG tablet Take 1 tablet (750 mg total) 2 (two) times daily as needed by mouth for muscle spasms. TAKE ONE TABLET BY MOUTH EVERY EIGHT HOURS AS NEEDED FOR MUSCLE SPASMS  . Misc. Devices (CANE) MISC 1 each by Does not apply route daily.  . mometasone-formoterol (DULERA) 200-5 MCG/ACT AERO Inhale 2 puffs into the lungs 2 (two) times daily.  . montelukast (SINGULAIR) 10 MG tablet Take 1 tablet (10 mg total) by mouth at bedtime.  . mupirocin ointment (BACTROBAN) 2 % Place 1 application into the nose 2 (two) times daily.  Marland Kitchen omeprazole (PRILOSEC) 20 MG capsule TAKE ONE CAPSULE BY MOUTH EVERY DAY BEFORE BREAKFAST  . Oyster Shell Calcium 500 MG TABS Take 1 tablet (1,250 mg total) by mouth daily.  Marland Kitchen PARoxetine (PAXIL) 20 MG tablet Take 1 tablet (20 mg total) by mouth daily.  . predniSONE (DELTASONE) 10 MG tablet Take 10 mg by mouth daily with breakfast.  . predniSONE (STERAPRED UNI-PAK 21 TAB) 10 MG (21) TBPK tablet Take by mouth daily. Take as directed.  . ranitidine (ZANTAC) 300 MG tablet TAKE 1 TABLET BY  MOUTH EVERY NIGHT AT BEDTIME  . ReliOn Ultra Thin Lancets MISC 1 each by Does not apply route 4 (four) times daily. ICD code E11.65  . Respiratory Therapy Supplies (FLUTTER) DEVI Use as directed  . Spacer/Aero-Holding Chambers (AEROCHAMBER MV) inhaler Use as instructed  . triamcinolone cream (KENALOG) 0.1 % Apply 1 application 2 (two) times daily topically.  Marland Kitchen ULTICARE MICRO PEN NEEDLES 32G X 4 MM MISC USE AS DIRECTED  . verapamil (CALAN-SR) 180 MG CR tablet Take 2 tablets (360 mg total) by mouth at bedtime.   Facility-Administered Encounter Medications as of 10/11/2017  Medication  . betamethasone acetate-betamethasone sodium phosphate (CELESTONE) injection 3 mg  . DOBUTamine (DOBUTREX) 1,000 mcg/mL in dextrose 5% 250 mL infusion     Review of Systems  Constitutional:   No  weight loss, night sweats,  Fevers, chills, fatigue, or  lassitude.  HEENT:   No headaches,  Difficulty swallowing,  Tooth/dental problems, or  Sore throat,                No sneezing, itching, ear ache,  +nasal congestion, post nasal drip,   CV:  No chest pain,  Orthopnea, PND, swelling in lower extremities, anasarca, dizziness, palpitations, syncope.   GI  No heartburn, indigestion, abdominal pain, nausea, vomiting, diarrhea, change in bowel habits, loss of appetite, bloody stools.   Resp:   No chest wall deformity  Skin: no rash or lesions.  GU: no dysuria, change in color of urine, no urgency or frequency.  No flank pain, no hematuria   MS:  No joint pain or swelling.  No decreased range of motion.  No back pain.    Physical Exam  BP 138/82 (BP Location: Right Arm, Cuff Size: Normal)   Pulse 80   Temp 98.9 F (37.2 C) (Oral)   Ht '4\' 10"'$  (1.473 m)   Wt 191 lb 9.6 oz (86.9 kg)   SpO2 95%   BMI 40.04 kg/m   GEN: A/Ox3; pleasant , NAD, obese    HEENT:  [place]/AT,  EACs-clear, TMs-wnl, NOSE-clear, THROAT-clear, no lesions, no postnasal drip or exudate noted.   NECK:  Supple w/ fair ROM; no JVD;  normal carotid impulses w/o bruits; no thyromegaly or nodules palpated; no lymphadenopathy.  RESP  Few trace wheezes , speaks in full sentences , no accessory muscle use, no dullness to percussion  CARD:  RRR, no m/r/g, no peripheral edema, pulses intact, no cyanosis or clubbing.  GI:   Soft & nt; nml bowel sounds; no organomegaly or masses detected.   Musco: Warm bil, no deformities or joint swelling noted.   Neuro: alert, no focal deficits noted.    Skin: Warm, no lesions or rashes    Lab Results:  CBC  BNP  Imaging: No results found.   Assessment & Plan:   Asthma ? severity vs VCD/ pseudo asthma Exacerbation will try to hold off on prednisone for now  Expand asthma regimen , utilize neb meds  While she is off prednisone , check cbc w/ diff and IgE .  Check cxr  Add Spriiva   Plan  . Patient Instructions  Continue on Dulera 2 puffs twice daily Add Spiriva 2 puffs daily Mucinex DM twice daily as needed for cough and congestion. Chest x-ray today Labs today .  Use albuterol nebulizer twice daily for the next 1 week . May use every 4hr as needed for wheezing .  Follow up with Dr. [place] Bells in 4 weeks as planned and As needed   If symptoms are not improving in next 1-2 days or worsen , begin prednisone taper as directed.  Please contact office for sooner follow up if symptoms do not improve or worsen or seek emergency care        Upper airway cough syndrome Control for triggers  Check Ige/RAST .      Rexene Edison, NP 10/11/2017

## 2017-10-11 NOTE — Progress Notes (Signed)
Reviewed, agree 

## 2017-10-11 NOTE — Assessment & Plan Note (Signed)
Control for triggers  Check Ige/RAST .

## 2017-10-11 NOTE — Assessment & Plan Note (Signed)
Exacerbation will try to hold off on prednisone for now  Expand asthma regimen , utilize neb meds  While she is off prednisone , check cbc w/ diff and IgE .  Check cxr  Add Spriiva   Plan  . Patient Instructions  Continue on Dulera 2 puffs twice daily Add Spiriva 2 puffs daily Mucinex DM twice daily as needed for cough and congestion. Chest x-ray today Labs today .  Use albuterol nebulizer twice daily for the next 1 week . May use every 4hr as needed for wheezing .  Follow up with Dr. Lake Bells in 4 weeks as planned and As needed   If symptoms are not improving in next 1-2 days or worsen , begin prednisone taper as directed.  Please contact office for sooner follow up if symptoms do not improve or worsen or seek emergency care

## 2017-10-11 NOTE — ED Provider Notes (Signed)
[facility]   [id number] 10/10/17 Arrival Time: [place]:  1. Mild persistent asthma with exacerbation    Likely early URI trigger.  Meds ordered this encounter  Medications  . predniSONE (STERAPRED UNI-PAK 21 TAB) 10 MG (21) TBPK tablet    Sig: Take by mouth daily. Take as directed.    Dispense:  21 tablet    Refill:  0   Discussed typical duration of symptoms. ED if abrupt worsening. May f/u with PCP or here as needed.  Reviewed expectations re: course of current medical issues. Questions answered. Outlined signs and symptoms indicating need for more acute intervention. Patient verbalized understanding. After Visit Summary given.   SUBJECTIVE: History from: patient and family.  Raven Turner is a 47 y.o. female who presents with complaint of mild nasal congestion, post-nasal drainage, and a persistent dry cough. Wheezing much more today. Onset abrupt, approximately 1 day ago. Mildly fatigued. SOB: none. Wheezing: moderate when present. Fever: no. Overall normal PO intake without n/v. Sick contacts: no. OTC treatment: none.  Received flu shot this year: no.  Social History   Tobacco Use  Smoking Status Never Smoker  Smokeless Tobacco Never Used    ROS: As per HPI.   OBJECTIVE:  Vitals:   10/10/17 1930  BP: (!) 150/71  Pulse: 81  Resp: 16  Temp: 98.5 F (36.9 C)  TempSrc: Oral  SpO2: 97%  Weight: 192 lb (87.1 kg)     General appearance: alert; appears fatigued HEENT: nasal congestion; clear runny nose; throat irritation secondary to post-nasal drainage Neck: supple without LAD Lungs: unlabored respirations, symmetrical air entry with bilateral expiratory wheezing; cough: moderate; no respiratory distress Skin: warm and dry Psychological: alert and cooperative; normal mood and affect  Allergies  Allergen Reactions  . Shellfish Allergy Anaphylaxis  . Septra [Sulfamethoxazole-Trimethoprim] Rash, Other (See Comments)  and Itching    Facial redness with pruritus  Facial redness with pruritus   . Hydrocortisone Rash    Past Medical History:  Diagnosis Date  . Anemia   . Asthma   . Bilateral hand numbness 06/25/2015  . Bone pain 02/27/2015   Diffuse bone pain in legs mostly but also in arms    . Breast pain, left 03/27/2015  . Callus of heel 12/16/2014  . Compression fracture   . Compression fracture of L1 lumbar vertebra ([facility]) 08/26/2014  . Compression fracture of T12 vertebra ([place]) 08/28/2014  . COPD (chronic obstructive pulmonary disease) ([place]) 2013  . Cushing syndrome ([place]) 2013   . Depression   . Diabetes ([place]) 2013   . Diabetes mellitus type 2, uncontrolled ([place]) 08/26/2014   Sees Dr. Katy Fitch.  Last visit 11/27/14.  No diabetic retinopathy    . Diabetes mellitus with neuropathy ([place])   . Diastolic dysfunction without heart failure 10/14/2014   Grade 2 diastolic dysfunction 2/2 pulm HTN   . Generalized anxiety disorder 03/27/2015  . HTN (hypertension) 2005   . Hx of cataract surgery 11/03/2014  . LUQ abdominal pain 07/17/2015  . Medication management 05/12/2015  . Morbid obesity ([place]) 09/08/2014  . 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 Non-English speaking patient 09/08/2014  . OSA (obstructive sleep apnea) 05/20/2015  . Osteopenia 11/19/2013   Dx with osteopenia in lumbar vertebra with partial compression of L1  . Pain due to onychomycosis of toenail 03/27/2015  . Secondary Cushing's syndrome/ iatrogenic   . Skin rash 07/17/2015  . Tachycardia 01/13/2015  . Upper airway cough syndrome 09/05/2014   Followed  in Pulmonary clinic/ [facility]/ [place]  - Trial off advair      09/05/2014 >>>  - trial off theoph 09/17/2014 and added flutter  - rec reduce dulera to 100 2 bid 07/13/2015     . Vision changes 06/25/2015  . Vitamin D deficiency 08/28/2014   Family History  Problem Relation Age of Onset  . Diabetes Mother   . Stroke Father   . Asthma Father   . Hypertension Sister   . Hypertension Brother     Social History   Socioeconomic History  . Marital status: Married    Spouse name: Not on file  . Number of children: Not on file  . Years of education: Not on file  . Highest education level: Not on file  Social Needs  . Financial resource strain: Not on file  . Food insecurity - worry: Not on file  . Food insecurity - inability: Not on file  . Transportation needs - medical: Not on file  . Transportation needs - non-medical: Not on file  Occupational History  . Not on file  Tobacco Use  . Smoking status: Never Smoker  . Smokeless tobacco: Never Used  Substance and Sexual Activity  . Alcohol use: No    Alcohol/week: 0.0 oz  . Drug use: No  . Sexual activity: Yes    Birth control/protection: Surgical  Other Topics Concern  . Not on file  Social History Narrative   Lives at home with husband.  No children   She does not work   Highest level of education:  12th grade           Vanessa Kick, MD 10/11/17 [id number]

## 2017-10-11 NOTE — Patient Instructions (Addendum)
Continue on Dulera 2 puffs twice daily Add Spiriva 2 puffs daily Mucinex DM twice daily as needed for cough and congestion. Chest x-ray today Labs today .  Use albuterol nebulizer twice daily for the next 1 week . May use every 4hr as needed for wheezing .  Follow up with Dr. Lake Bells in 4 weeks as planned and As needed   If symptoms are not improving in next 1-2 days or worsen , begin prednisone taper as directed.  Please contact office for sooner follow up if symptoms do not improve or worsen or seek emergency care   Instrucciones Continuar en [place]. Agrega Spiriva 2 inhalaciones diarias. Mucinex DM dos veces al da segn sea necesario para la tos y la congestin. Radiografa de trax hoy [employer] [facility] [place]. Use nebulizador de albuterol dos veces al da durante la prxima semana. Puede usar cada 4 horas segn sea necesario para sibilancias. Haga un seguimiento con el Dr. Lake Bells en 4 semanas segn lo planeado y segn sea necesario Si los sntomas no mejoran en los prximos 1 o 2 das o [place], comience a Armed forces technical officer prednisona segn lo indicado. Comunquese con la oficina para un seguimiento ms rpido si los sntomas no mejoran o empeoran o si buscan atencin de [employer]

## 2017-10-12 LAB — RESPIRATORY ALLERGY PROFILE REGION II ~~LOC~~
Allergen, A. alternata, m6: <0.1 kU/L
Allergen, Cedar tree, t12: <0.1 kU/L
Allergen, Comm Silver Birch, t9: <0.1 kU/L
Allergen, Cottonwood, t14: <0.1 kU/L
Allergen, D pternoyssinus,d7: 23.7 kU/L — ABNORMAL HIGH
Allergen, Mouse Urine Protein, e78: <0.1 kU/L
Allergen, Mulberry, t76: <0.1 kU/L
Allergen, Oak,t7: <0.1 kU/L
Allergen, P. notatum, m1: <0.1 kU/L
Aspergillus fumigatus, m3: <0.1 kU/L
Bermuda Grass: <0.1 kU/L
Box Elder IgE: <0.1 kU/L
CLADOSPORIUM HERBARUM (M2) IGE: <0.1 kU/L
COMMON RAGWEED (SHORT) (W1) IGE: <0.1 kU/L
Cat Dander: 0.17 kU/L — ABNORMAL HIGH
Class: 0
Class: 0
Class: 0
Class: 0
Class: 0
Class: 0
Class: 0
Class: 0
Class: 0
Class: 0
Class: 0
Class: 0
Class: 0
Class: 0
Class: 0
Class: 0
Class: 0
Class: 0
Class: 0
Class: 0
Class: 0
Class: 1
Class: 4
Class: 4
Cockroach: 0.13 kU/L — ABNORMAL HIGH
D. farinae: 23.8 kU/L — ABNORMAL HIGH
Dog Dander: 0.42 kU/L — ABNORMAL HIGH
Elm IgE: <0.1 kU/L
IgE (Immunoglobulin E), Serum: 94 kU/L (ref <=114)
Johnson Grass: <0.1 kU/L
Pecan/Hickory Tree IgE: <0.1 kU/L
Rough Pigweed  IgE: <0.1 kU/L
Sheep Sorrel IgE: <0.1 kU/L
Timothy Grass: <0.1 kU/L

## 2017-10-12 LAB — INTERPRETATION:

## 2017-10-12 MED ORDER — TIOTROPIUM BROMIDE MONOHYDRATE 2.5 MCG/ACT IN AERS: 2.0000 | INHALATION_SPRAY | Freq: Every day | RESPIRATORY_TRACT | 0 refills | Status: DC

## 2017-10-12 MED ORDER — LEVALBUTEROL HCL 0.63 MG/3ML IN NEBU
0.6300 mg | INHALATION_SOLUTION | Freq: Once | RESPIRATORY_TRACT | Status: AC
  Administered 2017-10-11: 0.63 mg via RESPIRATORY_TRACT

## 2017-10-12 NOTE — Addendum Note (Signed)
Addended by: Parke Poisson E on: 10/12/2017 03:26 PM   Modules accepted: Orders

## 2017-10-12 NOTE — Addendum Note (Signed)
Addended by: Parke Poisson E on: 10/12/2017 03:21 PM   Modules accepted: Orders

## 2017-10-12 NOTE — Progress Notes (Signed)
Patient seen in the office today and instructed on use of Spiriva Respimat 2.30mcg.  Patient expressed understanding and demonstrated technique. Parke Poisson, CMA 10/12/17

## 2017-10-16 ENCOUNTER — Telehealth: Payer: Self-pay | Admitting: Adult Health

## 2017-10-16 ENCOUNTER — Telehealth: Payer: Self-pay | Admitting: Internal Medicine

## 2017-10-16 DIAGNOSIS — F411 Generalized anxiety disorder: Secondary | ICD-10-CM

## 2017-10-16 DIAGNOSIS — J45998 Other asthma: Secondary | ICD-10-CM

## 2017-10-16 MED ORDER — OMEPRAZOLE 20 MG PO CPDR: DELAYED_RELEASE_CAPSULE | ORAL | 0 refills | Status: DC

## 2017-10-16 MED ORDER — PAROXETINE HCL 20 MG PO TABS: 20.0000 mg | ORAL_TABLET | Freq: Every day | ORAL | 0 refills | Status: DC

## 2017-10-16 MED ORDER — ATORVASTATIN CALCIUM 20 MG PO TABS: 20.0000 mg | ORAL_TABLET | Freq: Every day | ORAL | 0 refills | Status: DC

## 2017-10-16 MED FILL — ?OMEPRAZOLE 20 MG CPDR: 20 | 30 days supply | Qty: 30 | Fill #0

## 2017-10-16 MED FILL — ?ATORVASTATIN 20MG TABL: 20 | 30 days supply | Qty: 30 | Fill #0

## 2017-10-16 MED FILL — ?PAROXETINE HCL 20 MG TABLE: 20 | 30 days supply | Qty: 30 | Fill #0

## 2017-10-16 NOTE — Telephone Encounter (Signed)
Refilled

## 2017-10-16 NOTE — Telephone Encounter (Signed)
Tried contacting patients spouse x 2 still cannot reach. LMTCB

## 2017-10-16 NOTE — Telephone Encounter (Signed)
Called patients husband, unable to reach, left message for them to give [place] a call back regarding sick message.

## 2017-10-16 NOTE — Telephone Encounter (Signed)
Pt called to get refill for PARoxetine (PAXIL) 20 MG tablet  omeprazole (PRILOSEC) 20 MG capsule atorvastatin (LIPITOR) 20 MG tablet Please sent it to [facility] [place] [facility] [address] Please follow up

## 2017-10-17 MED ORDER — AZITHROMYCIN 250 MG PO TABS: ORAL_TABLET | ORAL | 0 refills | Status: DC

## 2017-10-17 NOTE — Telephone Encounter (Signed)
Per TP: okay for zpak #1 take as directed.  If develops wheezing, please call back for prednisone.  Mucinex DM twice daily as needed for cough/congestion.  If no better or if symptoms worsen, please call the office or seek emergency attention.  Thanks.

## 2017-10-17 NOTE — Telephone Encounter (Signed)
Called and spoke with pt's son, Raven Turner. Jose states pt has prod cough (pt is unsure of color) & chest congestion x1w Denies fever, chills sweats, body aches or other resp symptoms.  TP please advise. Thanks

## 2017-10-17 NOTE — Telephone Encounter (Signed)
Spoke with patient's son. He is aware of TP's recs. Will go ahead and call in zpak. Nothing else needed at time of call.

## 2017-10-19 ENCOUNTER — Telehealth: Payer: Self-pay | Admitting: Internal Medicine

## 2017-10-19 NOTE — Telephone Encounter (Signed)
I have removed [facility] from her list of preferred pharmacies

## 2017-10-19 NOTE — Telephone Encounter (Signed)
Pt only wants her medications sent to [facility],she will pick the ones already ordered at our pharmacy, but she requested to have our pharmacy be taken off her chart. Please follow up

## 2017-10-26 ENCOUNTER — Telehealth: Payer: Self-pay | Admitting: Pulmonary Disease

## 2017-10-26 MED ORDER — PREDNISONE 20 MG PO TABS: 20.0000 mg | ORAL_TABLET | Freq: Every day | ORAL | 0 refills | Status: DC

## 2017-10-26 MED ORDER — TIOTROPIUM BROMIDE MONOHYDRATE 2.5 MCG/ACT IN AERS: 2.0000 | INHALATION_SPRAY | Freq: Every day | RESPIRATORY_TRACT | 3 refills | Status: DC

## 2017-10-26 MED ORDER — BENZONATATE 200 MG PO CAPS: 200.0000 mg | ORAL_CAPSULE | Freq: Three times a day (TID) | ORAL | 1 refills | Status: DC | PRN

## 2017-10-26 NOTE — Telephone Encounter (Signed)
Prednisone 82m daily x 5 days Tessalon 202mpo tid prn cough, disp 45 ref 1

## 2017-10-26 NOTE — Telephone Encounter (Signed)
Spoke with pt's son, he states she is still coughing and having a lot more congestion. Raven Turner states she is  not feeling better. Denies fever, body aches, or sore throat. (Communication barrier). She is not coughing up any mucus but the cough is making her tired. Can we send in something for pt? It looks like a taper would be called in if not better. Please specify taper.     Patient Instructions  Continue on Dulera 2 puffs twice daily Add Spiriva 2 puffs daily Mucinex DM twice daily as needed for cough and congestion. Chest x-ray today Labs today .  Use albuterol nebulizer twice daily for the next 1 week . May use every 4hr as needed for wheezing .  Follow up with Dr. Lake Bells in 4 weeks as planned and As needed   If symptoms are not improving in next 1-2 days or worsen , begin prednisone taper as directed.  Please contact office for sooner follow up if symptoms do not improve or worsen or seek emergency care

## 2017-10-26 NOTE — Telephone Encounter (Signed)
Pt is aware of below message and voiced her understanding.  Rx for tessalon and prednisone has been sent to preferred pharmacy. Nothing further is needed.

## 2017-10-31 ENCOUNTER — Encounter: Payer: Self-pay | Admitting: Internal Medicine

## 2017-10-31 ENCOUNTER — Ambulatory Visit: Payer: Medicaid Other | Attending: Internal Medicine | Admitting: Internal Medicine

## 2017-10-31 VITALS — BP 140/68 | HR 65 | Temp 97.9°F | Resp 16 | Ht 59.0 in | Wt 194.2 lb

## 2017-10-31 DIAGNOSIS — G4733 Obstructive sleep apnea (adult) (pediatric): Secondary | ICD-10-CM | POA: Diagnosis not present

## 2017-10-31 DIAGNOSIS — K7581 Nonalcoholic steatohepatitis (NASH): Secondary | ICD-10-CM | POA: Insufficient documentation

## 2017-10-31 DIAGNOSIS — Z7952 Long term (current) use of systemic steroids: Secondary | ICD-10-CM | POA: Insufficient documentation

## 2017-10-31 DIAGNOSIS — E1140 Type 2 diabetes mellitus with diabetic neuropathy, unspecified: Secondary | ICD-10-CM | POA: Insufficient documentation

## 2017-10-31 DIAGNOSIS — I10 Essential (primary) hypertension: Secondary | ICD-10-CM | POA: Insufficient documentation

## 2017-10-31 DIAGNOSIS — IMO0001 Reserved for inherently not codable concepts without codable children: Secondary | ICD-10-CM

## 2017-10-31 DIAGNOSIS — F411 Generalized anxiety disorder: Secondary | ICD-10-CM | POA: Diagnosis not present

## 2017-10-31 DIAGNOSIS — J45998 Other asthma: Secondary | ICD-10-CM | POA: Diagnosis not present

## 2017-10-31 DIAGNOSIS — E559 Vitamin D deficiency, unspecified: Secondary | ICD-10-CM | POA: Insufficient documentation

## 2017-10-31 DIAGNOSIS — Z7982 Long term (current) use of aspirin: Secondary | ICD-10-CM | POA: Diagnosis not present

## 2017-10-31 DIAGNOSIS — F329 Major depressive disorder, single episode, unspecified: Secondary | ICD-10-CM | POA: Diagnosis not present

## 2017-10-31 DIAGNOSIS — I272 Pulmonary hypertension, unspecified: Secondary | ICD-10-CM | POA: Insufficient documentation

## 2017-10-31 DIAGNOSIS — Z79899 Other long term (current) drug therapy: Secondary | ICD-10-CM | POA: Insufficient documentation

## 2017-10-31 DIAGNOSIS — Z91013 Allergy to seafood: Secondary | ICD-10-CM | POA: Insufficient documentation

## 2017-10-31 DIAGNOSIS — Z79890 Hormone replacement therapy (postmenopausal): Secondary | ICD-10-CM | POA: Insufficient documentation

## 2017-10-31 DIAGNOSIS — D352 Benign neoplasm of pituitary gland: Secondary | ICD-10-CM | POA: Insufficient documentation

## 2017-10-31 DIAGNOSIS — Z794 Long term (current) use of insulin: Secondary | ICD-10-CM | POA: Diagnosis not present

## 2017-10-31 DIAGNOSIS — M545 Low back pain, unspecified: Secondary | ICD-10-CM

## 2017-10-31 DIAGNOSIS — Z882 Allergy status to sulfonamides status: Secondary | ICD-10-CM | POA: Diagnosis not present

## 2017-10-31 DIAGNOSIS — E229 Hyperfunction of pituitary gland, unspecified: Secondary | ICD-10-CM

## 2017-10-31 DIAGNOSIS — J449 Chronic obstructive pulmonary disease, unspecified: Secondary | ICD-10-CM | POA: Diagnosis not present

## 2017-10-31 DIAGNOSIS — E039 Hypothyroidism, unspecified: Secondary | ICD-10-CM

## 2017-10-31 DIAGNOSIS — E1165 Type 2 diabetes mellitus with hyperglycemia: Secondary | ICD-10-CM | POA: Insufficient documentation

## 2017-10-31 DIAGNOSIS — Z6839 Body mass index (BMI) 39.0-39.9, adult: Secondary | ICD-10-CM | POA: Diagnosis not present

## 2017-10-31 DIAGNOSIS — E1121 Type 2 diabetes mellitus with diabetic nephropathy: Secondary | ICD-10-CM | POA: Insufficient documentation

## 2017-10-31 DIAGNOSIS — M8588 Other specified disorders of bone density and structure, other site: Secondary | ICD-10-CM | POA: Insufficient documentation

## 2017-10-31 DIAGNOSIS — R946 Abnormal results of thyroid function studies: Secondary | ICD-10-CM | POA: Diagnosis not present

## 2017-10-31 DIAGNOSIS — G8929 Other chronic pain: Secondary | ICD-10-CM | POA: Diagnosis not present

## 2017-10-31 DIAGNOSIS — R7989 Other specified abnormal findings of blood chemistry: Secondary | ICD-10-CM

## 2017-10-31 LAB — GLUCOSE, POCT (MANUAL RESULT ENTRY): POC Glucose: 188 mg/dl — AB (ref 70–99)

## 2017-10-31 MED ORDER — HYDROXYZINE HCL 25 MG PO TABS: 25.0000 mg | ORAL_TABLET | Freq: Every evening | ORAL | 1 refills | Status: DC | PRN

## 2017-10-31 MED ORDER — PAROXETINE HCL 20 MG PO TABS: 20.0000 mg | ORAL_TABLET | Freq: Every day | ORAL | 0 refills | Status: DC

## 2017-10-31 MED ORDER — ALBUTEROL SULFATE (2.5 MG/3ML) 0.083% IN NEBU: INHALATION_SOLUTION | RESPIRATORY_TRACT | 0 refills | Status: DC

## 2017-10-31 MED ORDER — ALBUTEROL SULFATE HFA 108 (90 BASE) MCG/ACT IN AERS: 2.0000 | INHALATION_SPRAY | RESPIRATORY_TRACT | 6 refills | Status: DC | PRN

## 2017-10-31 MED ORDER — ASPIRIN EC 81 MG PO TBEC: 81.0000 mg | DELAYED_RELEASE_TABLET | Freq: Every day | ORAL | 3 refills | Status: AC

## 2017-10-31 MED ORDER — LOSARTAN POTASSIUM 100 MG PO TABS: 100.0000 mg | ORAL_TABLET | Freq: Every morning | ORAL | 3 refills | Status: DC

## 2017-10-31 MED ORDER — METFORMIN HCL 1000 MG PO TABS: ORAL_TABLET | ORAL | 2 refills | Status: DC

## 2017-10-31 MED ORDER — VERAPAMIL HCL ER 180 MG PO TBCR: 360.0000 mg | EXTENDED_RELEASE_TABLET | Freq: Every day | ORAL | 0 refills | Status: DC

## 2017-10-31 MED ORDER — GABAPENTIN 400 MG PO CAPS: 800.0000 mg | ORAL_CAPSULE | Freq: Three times a day (TID) | ORAL | 0 refills | Status: DC

## 2017-10-31 MED ORDER — GLUCOSE BLOOD VI STRP: 1.0000 | ORAL_STRIP | Freq: Four times a day (QID) | 3 refills | Status: AC

## 2017-10-31 MED ORDER — OMEPRAZOLE 20 MG PO CPDR: DELAYED_RELEASE_CAPSULE | ORAL | 0 refills | Status: DC

## 2017-10-31 MED ORDER — ATORVASTATIN CALCIUM 20 MG PO TABS: 20.0000 mg | ORAL_TABLET | Freq: Every day | ORAL | 0 refills | Status: DC

## 2017-10-31 MED ORDER — UMECLIDINIUM BROMIDE 62.5 MCG/INH IN AEPB: 1.0000 | INHALATION_SPRAY | Freq: Every day | RESPIRATORY_TRACT | 6 refills | Status: DC

## 2017-10-31 MED ORDER — CABERGOLINE 0.5 MG PO TABS: 0.2500 mg | ORAL_TABLET | ORAL | 3 refills | Status: DC

## 2017-10-31 MED ORDER — CLONIDINE HCL 0.2 MG PO TABS: 0.2000 mg | ORAL_TABLET | Freq: Three times a day (TID) | ORAL | 2 refills | Status: DC

## 2017-10-31 MED ORDER — MONTELUKAST SODIUM 10 MG PO TABS: 10.0000 mg | ORAL_TABLET | Freq: Every day | ORAL | 0 refills | Status: DC

## 2017-10-31 MED ORDER — UMECLIDINIUM BROMIDE 62.5 MCG/INH IN AEPB: 1.0000 | INHALATION_SPRAY | Freq: Every day | RESPIRATORY_TRACT | Status: DC

## 2017-10-31 MED ORDER — FUROSEMIDE 20 MG PO TABS: 20.0000 mg | ORAL_TABLET | Freq: Every morning | ORAL | 2 refills | Status: AC

## 2017-10-31 MED FILL — ACCU-CHEK AVIVA PLUS TEST S: 25 days supply | Qty: 100 | Fill #0

## 2017-10-31 MED FILL — metFORMIN HCL 1000 MG TABS: 1000 | 30 days supply | Qty: 60 | Fill #0

## 2017-10-31 MED FILL — GABAPENTIN 400 MG CAP: 400 | 30 days supply | Qty: 180 | Fill #0

## 2017-10-31 MED FILL — FUROSEMIDE 20 MG TABLET: 20 | 30 days supply | Qty: 30 | Fill #0

## 2017-10-31 MED FILL — CABERGOLINE 0.5 MG TABLET: 0.5 | 28 days supply | Qty: 4 | Fill #0

## 2017-10-31 MED FILL — VERAPAMIL ER 180 MG TABLET: 180 | 90 days supply | Qty: 180 | Fill #0

## 2017-10-31 MED FILL — PROVENTIL HFA 108 (90 BASE): 108 (90 BAS | 16 days supply | Qty: 7 | Fill #0

## 2017-10-31 MED FILL — ALBUTEROL 0.083% INHAL SOLN: (2.5 MG/3ML | 5 days supply | Qty: 90 | Fill #0

## 2017-10-31 MED FILL — ATORVASTATIN 20 MG TABLET: 20 | 30 days supply | Qty: 30 | Fill #0

## 2017-10-31 NOTE — Progress Notes (Signed)
Subjective:  Patient ID: Raven Turner, female    DOB: [birth year]  Age: 47 y.o. MRN: [id number]  CC: Diabetes and Hypertension   HPI Raven Turner is a 47 y/o female with medical hx of Asthma, HTN, DM, steroid induced Cushing's, NASH, OSA, Pituitary Adenoma, Anxiety, and Chronic Pain. She presents today for 3 month f/u on chronic conditions and c/o hand swelling.   Asthma: Patient was recently treated for an upper respiratory infection by Pulmonology. Reports resolution of symptoms since the completion of antibiotics and prednisone. She is still using her Albuterol nebulizer twice a day for intermittent cough and wheezing. Denies productive cough, fever, or chills. She reports increased fatigue and dyspnea with minimal activity, such as showering in the mornings. Report symptoms resolve with rest. Denies chest tightness, palpitations, dizziness, or near syncope. She is requesting medication refills; to change her Spiriva to another medication due to insurance coverage. She has a pending Pulmonology appointment at the end month.   HTN: Patient is adherent to medication regimen and low salt diet. She does not check her blood pressure regularly. Denies recurrent headaches, chest pain, or peripheral edema. Reports pain (5/10) behind her L knee that radiates to her L hip, while sitting. The pain resolves with movement. Denies claudication. Denies changes in vision. She is scheduled to see an Ophthalmologist every 3 months for her pituitary  adenoma. Reports not abnormalities reports from her Opthalmologist regarding vessel nicking or hemorrhages.   DM: Patient is adherent to medication regimen and low-carb/low-sugar diet. Denies episodes pf hypoglycemia and hyperglycemia. She does not check her glucose levels daily, but at least once per week. Her normal ranges are from 110-120's. She is followed by Endocrinology. Reports baseline paresthesia with worsening leg sensations described as  cold  water running down her legs, bilaterally. Denies reduced sensantion in lower extremities. Denies n/v/d, polyuria, polyphagia, or polydipsia.   Anxiety: Patient is adherent to Hydroxyzine medication last prescribed and has discontinued the Klonopin. Reports improved sleeping with medication and sleep hygiene recommendations. She is sleeping approximately 8 hours per night. She states she still feels unrest and lack of energy upon awakening. Her energy level improves throughout the day. Denies confusion, hallucinations, decreased concentration, ideations of self-harm or harming of others.    Hand Swelling:  Reports increased swelling in bilateral hands with pain and stiffness. Denies radiating pain or swelling to multiple joints. She states that she swelling resolves when she is on prednisone and returns a few days after completion of the prednisone. Denies intolerance to heat/cold temperatures. Denies changes to hair or skin.   Past Medical History:  Diagnosis Date  . Anemia   . Asthma   . Bilateral hand numbness 06/25/2015  . Bone pain 02/27/2015   Diffuse bone pain in legs mostly but also in arms    . Breast pain, left 03/27/2015  . Callus of heel 12/16/2014  . Compression fracture   . Compression fracture of L1 lumbar vertebra (HCC) 08/26/2014  . Compression fracture of T12 vertebra ([place]) 08/28/2014  . COPD (chronic obstructive pulmonary disease) ([place]) 2013  . Cushing syndrome ([place]) 2013   . Depression   . Diabetes ([place]) 2013   . Diabetes mellitus type 2, uncontrolled ([place]) 08/26/2014   Sees Dr. Katy Fitch.  Last visit 11/27/14.  No diabetic retinopathy    . Diabetes mellitus with neuropathy ([place])   . Diastolic dysfunction without heart failure 10/14/2014   Grade 2 diastolic dysfunction 2/2 pulm HTN   . Generalized anxiety  disorder 03/27/2015  . HTN (hypertension) 2005   . Hx of cataract surgery 11/03/2014  . LUQ abdominal pain 07/17/2015  . Medication management 05/12/2015  . Morbid obesity  ([place]) 09/08/2014  . 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 Non-English speaking patient 09/08/2014  . OSA (obstructive sleep apnea) 05/20/2015  . Osteopenia 11/19/2013   Dx with osteopenia in lumbar vertebra with partial compression of L1  . Pain due to onychomycosis of toenail 03/27/2015  . Secondary Cushing's syndrome/ iatrogenic   . Skin rash 07/17/2015  . Tachycardia 01/13/2015  . Upper airway cough syndrome 09/05/2014   Followed in Pulmonary clinic/ [facility]/ [place]  - Trial off advair      09/05/2014 >>>  - trial off theoph 09/17/2014 and added flutter  - rec reduce dulera to 100 2 bid 07/13/2015     . Vision changes 06/25/2015  . Vitamin D deficiency 08/28/2014   Past Surgical History:  Procedure Laterality Date  . CATARACT EXTRACTION Bilateral 2014  . HAND SURGERY Right 12/18/2015  . VAGINAL HYSTERECTOMY     Patient Active Problem List   Diagnosis Date Noted  . Pituitary microadenoma ([place]) 03/13/2017  . Elevated prolactin level ([place]) 01/19/2017  . Microalbuminuria due to type 2 diabetes mellitus ([place]) 12/19/2016  . Elevated TSH 12/19/2016  . Menstrual changes 12/19/2016  . Flaking of scalp 07/18/2016  . Acral type peeling skin syndrome 07/18/2016  . Non-traumatic compression fracture of vertebral column ([place]) 01/20/2016  . Recurrent abdominal pain 01/11/2016  . Bilateral carpal tunnel syndrome 12/21/2015  . Chronic back pain 11/05/2015  . NASH (nonalcoholic steatohepatitis) 09/25/2015  . Pruritic erythematous rash 07/17/2015  . Bilateral hand numbness 06/25/2015  . Vision changes 06/25/2015  . OSA (obstructive sleep apnea) 05/20/2015  . Generalized anxiety disorder 03/27/2015  . Pain due to onychomycosis of toenail 03/27/2015  . Breast pain, left 03/27/2015  . Bone pain 02/27/2015  . Callus of heel 12/16/2014  . 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 Hx of cataract surgery 11/03/2014  . Diastolic dysfunction without heart failure 10/14/2014  . Morbid obesity ([place]) 09/08/2014    . Non-English speaking patient 09/08/2014  . Upper airway cough syndrome 09/05/2014  . Asthma ? severity vs VCD/ pseudo asthma 09/05/2014  . Vitamin D deficiency 08/28/2014  . Compression fracture of T12 vertebra ([place]) 08/28/2014  . Diabetes mellitus type 2, uncontrolled ([place]) 08/26/2014  . Compression fracture of L1 lumbar vertebra (HCC) 08/26/2014  . Essential (primary) hypertension   . Secondary Cushing's syndrome/ iatrogenic   . 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 Marital status: Married    Spouse name: Not on file  . Number of children: Not on file  . Years of education: Not on file  . Highest education level: Not on file  Social Needs  . Financial resource strain: Not on file  . Food insecurity - worry: Not on file  . Food insecurity - inability: Not on file  . Transportation needs - medical: Not on file  . Transportation needs - non-medical: Not on file  Occupational History  . Not on file  Tobacco Use  . Smoking status: Never Smoker  . Smokeless tobacco: Never Used  Substance and Sexual Activity  . Alcohol use: No    Alcohol/week: 0.0 oz  . Drug use: No  . Sexual activity: Yes    Birth control/protection: Surgical  Other Topics Concern  . Not on file  Social History Narrative   Lives at home with  husband.  No children   She does not work   Highest level of education:  12th grade   Outpatient Medications Prior to Visit  Medication Sig Dispense Refill  . Albiglutide (TANZEUM) 50 MG PEN Inject 50 mg into the skin once a week. 4 each 3  . azithromycin (ZITHROMAX) 250 MG tablet Take 2 tablets on first day, then 1 tablet daily until finished. 6 tablet 0  . benzonatate (TESSALON) 200 MG capsule Take 1 capsule (200 mg total) by mouth 3 (three) times daily as needed for cough. 45 capsule 1  . Blood Glucose Monitoring Suppl (ACCU-CHEK AVIVA PLUS) W/DEVICE KIT Used as directed 1 kit 0  . buprenorphine (BUTRANS - DOSED  MCG/HR) 5 MCG/HR PTWK patch Place 5 mcg onto the skin once a week.    . cholecalciferol (VITAMIN D) 1000 UNITS tablet Take 1,000 Units by mouth every morning.    . clotrimazole-betamethasone (LOTRISONE) cream Apply to affected area 2 times daily prn 45 g 0  . dextromethorphan-guaiFENesin (MUCINEX DM) 30-600 MG 12hr tablet Take 1 tablet by mouth 2 (two) times daily as needed (with flutter valve for cough and congestion). Reported on 04/01/2016    . ferrous sulfate 325 (65 FE) MG tablet Take 1 tablet (325 mg total) by mouth daily with breakfast. 30 tablet 3  . FOLIC ACID PO Take 1 tablet by mouth every morning.    Marland Kitchen HUMULIN R U-500 KWIKPEN 500 UNIT/ML kwikpen Inject 120-180 Units into the skin 3 (three) times daily. 180 U before breakfast, 160 U before lunch, 140 U before dinner 27 mL 11  . Insulin Pen Needle (B-D ULTRAFINE III SHORT PEN) 31G X 8 MM MISC 1 application by Does not apply route daily. 100 each 3  . Insulin Syringe-Needle U-100 (BD INSULIN SYRINGE ULTRAFINE) 31G X 15/64" 1 ML MISC 1 each by Does not apply route 3 (three) times daily. 300 each 3  . Lancet Devices (ACCU-CHEK SOFTCLIX) lancets Use as instructed 1 each 0  . levothyroxine (SYNTHROID, LEVOTHROID) 25 MCG tablet Take 25 mcg by mouth daily.  3  . methocarbamol (ROBAXIN) 750 MG tablet Take 1 tablet (750 mg total) 2 (two) times daily as needed by mouth for muscle spasms. TAKE ONE TABLET BY MOUTH EVERY EIGHT HOURS AS NEEDED FOR MUSCLE SPASMS 60 tablet 2  . Misc. Devices (CANE) MISC 1 each by Does not apply route daily. 1 each 0  . mometasone-formoterol (DULERA) 200-5 MCG/ACT AERO Inhale 2 puffs into the lungs 2 (two) times daily. 1 Inhaler 5  . mupirocin ointment (BACTROBAN) 2 % Place 1 application into the nose 2 (two) times daily. 22 g 0  . Oyster Shell Calcium 500 MG TABS Take 1 tablet (1,250 mg total) by mouth daily. 90 tablet 3  . predniSONE (DELTASONE) 10 MG tablet Take 10 mg by mouth daily with breakfast.    . predniSONE  (DELTASONE) 20 MG tablet Take 1 tablet (20 mg total) by mouth daily with breakfast. 5 tablet 0  . predniSONE (STERAPRED UNI-PAK 21 TAB) 10 MG (21) TBPK tablet Take by mouth daily. Take as directed. 21 tablet 0  . ranitidine (ZANTAC) 300 MG tablet TAKE 1 TABLET BY MOUTH EVERY NIGHT AT BEDTIME 30 tablet 5  . ReliOn Ultra Thin Lancets MISC 1 each by Does not apply route 4 (four) times daily. ICD code E11.65 400 each 3  . Respiratory Therapy Supplies (FLUTTER) DEVI Use as directed 1 each 0  . Spacer/Aero-Holding Chambers (AEROCHAMBER MV) inhaler Use  as instructed 1 each 0  . Tiotropium Bromide Monohydrate (SPIRIVA RESPIMAT) 2.5 MCG/ACT AERS Inhale 2 puffs into the lungs daily. 1 Inhaler 3  . triamcinolone cream (KENALOG) 0.1 % Apply 1 application 2 (two) times daily topically. 30 g 0  . ULTICARE MICRO PEN NEEDLES 32G X 4 MM MISC USE AS DIRECTED 100 each 2  . albuterol (PROVENTIL HFA;VENTOLIN HFA) 108 (90 Base) MCG/ACT inhaler Inhale 2 puffs into the lungs every 4 (four) hours as needed for wheezing or shortness of breath (((PLAN B))).    Marland Kitchen albuterol (PROVENTIL) (2.5 MG/3ML) 0.083% nebulizer solution USE 1 VIAL VIA NEBULIZER EVERY 4 HOURS AS NEEDED FOR WHEEZING OR SHORTNESS OF BREATH 90 mL 0  . aspirin EC 81 MG tablet Take 1 tablet (81 mg total) by mouth daily. 90 tablet 3  . atorvastatin (LIPITOR) 20 MG tablet Take 1 tablet (20 mg total) by mouth daily. 90 tablet 0  . cabergoline (DOSTINEX) 0.5 MG tablet Take 0.25 mg by mouth 2 (two) times a week.    . cloNIDine (CATAPRES) 0.2 MG tablet Take 1 tablet (0.2 mg total) by mouth 3 (three) times daily. 90 tablet 2  . furosemide (LASIX) 20 MG tablet Take 1 tablet (20 mg total) by mouth every morning. 30 tablet 2  . gabapentin (NEURONTIN) 400 MG capsule Take 2 capsules (800 mg total) by mouth 3 (three) times daily. 180 capsule 0  . glucose blood (RELION GLUCOSE TEST STRIPS) test strip 1 each by Other route 4 (four) times daily. ICD code E11.65 400 each 3  .  hydrOXYzine (ATARAX/VISTARIL) 25 MG tablet Take 1 tablet (25 mg total) at bedtime as needed by mouth for anxiety. Start after you finish current Clonazepam prescription 30 tablet 1  . losartan (COZAAR) 100 MG tablet Take 1 tablet (100 mg total) by mouth every morning. 90 tablet 3  . metFORMIN (GLUCOPHAGE) 1000 MG tablet TAKE 1 TABLET BY MOUTH TWICE DAILY WITH A MEAL 60 tablet 2  . montelukast (SINGULAIR) 10 MG tablet Take 1 tablet (10 mg total) by mouth at bedtime. 90 tablet 0  . omeprazole (PRILOSEC) 20 MG capsule TAKE ONE CAPSULE BY MOUTH EVERY DAY BEFORE BREAKFAST 90 capsule 0  . PARoxetine (PAXIL) 20 MG tablet Take 1 tablet (20 mg total) by mouth daily. 90 tablet 0  . verapamil (CALAN-SR) 180 MG CR tablet Take 2 tablets (360 mg total) by mouth at bedtime. 180 tablet 0   Facility-Administered Medications Prior to Visit  Medication Dose Route Frequency Provider Last Rate Last Dose  . betamethasone acetate-betamethasone sodium phosphate (CELESTONE) injection 3 mg  3 mg Intramuscular Once Daylene Katayama M, DPM      . DOBUTamine (DOBUTREX) 1,000 mcg/mL in dextrose 5% 250 mL infusion  30 mcg/kg/min Intravenous Continuous Croitoru, Mihai, MD 145.3 mL/hr at 04/22/15 1300 30 mcg/kg/min at 04/22/15 1300   Allergies  Allergen Reactions  . Shellfish Allergy Anaphylaxis  . Septra [Sulfamethoxazole-Trimethoprim] Rash, Other (See Comments) and Itching    Facial redness with pruritus  Facial redness with pruritus   . Hydrocortisone Rash   ROS Review of Systems  Constitutional: Positive for fatigue. Negative for activity change, appetite change, chills, fever and unexpected weight change.  Eyes: Negative for visual disturbance.  Respiratory: Positive for cough, shortness of breath and wheezing. Negative for chest tightness.   Cardiovascular: Negative for chest pain, palpitations and leg swelling.  Gastrointestinal: Negative for abdominal distention, abdominal pain, diarrhea, nausea and vomiting.    Endocrine: Negative.  Negative for  cold intolerance, heat intolerance, polydipsia, polyphagia and polyuria.  Musculoskeletal: Positive for arthralgias and joint swelling.  Skin: Negative for color change, rash and wound.  Neurological: Positive for numbness. Negative for dizziness, syncope, weakness, light-headedness and headaches.  Psychiatric/Behavioral: Negative for confusion, decreased concentration, dysphoric mood, hallucinations and sleep disturbance. The patient is not nervous/anxious.   All other systems reviewed and are negative.  Objective:  BP (!) 155/74   Pulse 65   Temp 97.9 F (36.6 C) (Oral)   Resp 16   Ht _0  (1.499 m)   Wt 194 lb 3.2 oz (88.1 kg)   LMP 10/07/2017   SpO2 99%   BMI 39.22 kg/m   BP/Weight 10/31/2017 10/11/2017 03/09/3661  Systolic BP 947 654 650  Diastolic BP 74 82 71  Wt. (Lbs) 194.2 191.6 192  BMI 39.22 40.04 40.13   Lab Results  Component Value Date   TSH 4.250 12/19/2016   Lab Results  Component Value Date   HGBA1C 6.5 07/31/2017   Physical Exam  Constitutional: She is oriented to person, place, and time. She appears well-developed and well-nourished. No distress.  HENT:  Head: Normocephalic and atraumatic.  Eyes: Conjunctivae and EOM are normal.  Neck: Normal range of motion. Neck supple. No JVD present. Carotid bruit is not present.  Cardiovascular: Normal rate and regular rhythm. PMI is not displaced. Exam reveals no gallop.  Murmur (mild systolic murmur) heard.  Systolic murmur is present with a grade of 1/6. Pulses:      Radial pulses are 2+ on the right side, and 2+ on the left side.       Dorsalis pedis pulses are 2+ on the right side, and 2+ on the left side.  Pulmonary/Chest: Effort normal. She has wheezes (mild wheezing at the base, bilaterally). She exhibits no tenderness.  Abdominal: Soft. Bowel sounds are normal. There is no tenderness.  Musculoskeletal: Normal range of motion.  Patient was wearing her back brace.   Lymphadenopathy:  Negative head and cervical lymphadenopathy  Neurological: She is alert and oriented to person, place, and time.  Skin: Skin is warm, dry and intact. No rash noted. No erythema.  Psychiatric: She has a normal mood and affect. Her speech is normal and behavior is normal. Judgment and thought content normal.  Nursing note and vitals reviewed.  Assessment & Plan:   1. Asthma ? severity vs VCD/ pseudo asthma, controlled - Patient is recovering from an acute respiratory infection; she is currently well controlled on medication regimen. She is being followed by Pulmonology.  - Continue medication regimen and f/u with Pulmonology as scheduled.  - F/u in 3 months.  - umeclidinium bromide (INCRUSE ELLIPTA) 62.5 MCG/INH AEPB; Inhale 1 puff into the lungs daily.  Dispense: 1 each; Refill: 6 - albuterol (PROVENTIL HFA;VENTOLIN HFA) 108 (90 Base) MCG/ACT inhaler; Inhale 2 puffs into the lungs every 4 (four) hours as needed for wheezing or shortness of breath (((PLAN B))).  Dispense: 18 g; Refill: 6 - albuterol (PROVENTIL) (2.5 MG/3ML) 0.083% nebulizer solution; USE 1 VIAL VIA NEBULIZER EVERY 4 HOURS AS NEEDED FOR WHEEZING OR SHORTNESS OF BREATH  Dispense: 90 mL; Refill: 0 - montelukast (SINGULAIR) 10 MG tablet; Take 1 tablet (10 mg total) by mouth at bedtime.  Dispense: 90 tablet; Refill: 0 - omeprazole (PRILOSEC) 20 MG capsule; TAKE ONE CAPSULE BY MOUTH EVERY DAY BEFORE BREAKFAST  Dispense: 90 capsule; Refill: 0 - umeclidinium bromide (INCRUSE ELLIPTA) 62.5 MCG/INH 1 puff  2. 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 Patient is well controlled with medication regimen and diet. She is being followed by Endocrinology.  - Continue with medication regimen and f/u with Endocrinology.  - F/u in 3 months. - POCT glucose (manual entry) - metFORMIN (GLUCOPHAGE) 1000 MG tablet; TAKE 1 TABLET BY MOUTH TWICE DAILY WITH A MEAL  Dispense:  60 tablet; Refill: 2 - glucose blood (RELION GLUCOSE TEST STRIPS) test strip; 1 each by Other route 4 (four) times daily. ICD code E11.65  Dispense: 400 each; Refill: 3  3. Long term current use of aspirin - Patient well controlled with medication regimen. Requested a refill.  - Will continue medication regimen and f/u in 3 months. - aspirin EC 81 MG tablet; Take 1 tablet (81 mg total) by mouth daily.  Dispense: 90 tablet; Refill: 3  4. Essential hypertension, controlled  - Patient's in clinic repeat BP was 140/68. She is well controlled with medication regimen. - Continue medication regimen and f/u in 4 months.  - atorvastatin (LIPITOR) 20 MG tablet; Take 1 tablet (20 mg total) by mouth daily.  Dispense: 90 tablet; Refill: 0 - cloNIDine (CATAPRES) 0.2 MG tablet; Take 1 tablet (0.2 mg total) by mouth 3 (three) times daily.  Dispense: 90 tablet; Refill: 2 - furosemide (LASIX) 20 MG tablet; Take 1 tablet (20 mg total) by mouth every morning.  Dispense: 30 tablet; Refill: 2 - losartan (COZAAR) 100 MG tablet; Take 1 tablet (100 mg total) by mouth every morning.  Dispense: 90 tablet; Refill: 3 - verapamil (CALAN-SR) 180 MG CR tablet; Take 2 tablets (360 mg total) by mouth at bedtime.  Dispense: 180 tablet; Refill: 0  5. Chronic midline low back pain without sciatica, uncontrolled - Patient reports increased discomfort in her legs, bilaterally. She needed a refill on medication. Medication refilled and will continue to monitor.  - F/u in 3 months.  - gabapentin (NEURONTIN) 400 MG capsule; Take 2 capsules (800 mg total) by mouth 3 (three) times daily.  Dispense: 180 capsule; Refill: 0  6. Generalized anxiety disorder, controlled - Patient reports improvement with her sleep using Hydroxyzine. She is well controlled with Paxil; denies episodes of anxiety or panic attacks. - Will continue medication regimen and f/u in 4 months.  - hydrOXYzine (ATARAX/VISTARIL) 25 MG tablet; Take 1 tablet (25 mg total)  by mouth at bedtime as needed for anxiety. Start after you finish current Clonazepam prescription  Dispense: 30 tablet; Refill: 1 - PARoxetine (PAXIL) 20 MG tablet; Take 1 tablet (20 mg total) by mouth daily.  Dispense: 90 tablet; Refill: 0  7. Hypothyroidism, unspecified type - Patient is adherent to medication regimen. She reports increased swelling in hands, bilaterally. Ordered TSH to evaluate management of thyroid levels in relation to hand swelling. - Will continue to monitor, pending TSH results. - F/u in 3 months.   - TSH  8. Elevated prolactin level ([place]) - Patient requested medication refills.  - Will continue management with Endocrinology.  - cabergoline (DOSTINEX) 0.5 MG tablet; Take 0.5 tablets (0.25 mg total) by mouth 2 (two) times a week.  Dispense: 10 tablet; Refill: 3  Meds ordered this encounter  Medications  . umeclidinium bromide (INCRUSE ELLIPTA) 62.5 MCG/INH AEPB    Sig: Inhale 1 puff into the lungs daily.    Dispense:  1 each    Refill:  6  . albuterol (PROVENTIL HFA;VENTOLIN HFA) 108 (90 Base) MCG/ACT inhaler    Sig: Inhale 2 puffs into the lungs every  4 (four) hours as needed for wheezing or shortness of breath (((PLAN B))).    Dispense:  18 g    Refill:  6  . albuterol (PROVENTIL) (2.5 MG/3ML) 0.083% nebulizer solution    Sig: USE 1 VIAL VIA NEBULIZER EVERY 4 HOURS AS NEEDED FOR WHEEZING OR SHORTNESS OF BREATH    Dispense:  90 mL    Refill:  0  . aspirin EC 81 MG tablet    Sig: Take 1 tablet (81 mg total) by mouth daily.    Dispense:  90 tablet    Refill:  3  . atorvastatin (LIPITOR) 20 MG tablet    Sig: Take 1 tablet (20 mg total) by mouth daily.    Dispense:  90 tablet    Refill:  0  . cabergoline (DOSTINEX) 0.5 MG tablet    Sig: Take 0.5 tablets (0.25 mg total) by mouth 2 (two) times a week.    Dispense:  10 tablet    Refill:  3  . cloNIDine (CATAPRES) 0.2 MG tablet    Sig: Take 1 tablet (0.2 mg total) by mouth 3 (three) times daily.    Dispense:   90 tablet    Refill:  2  . furosemide (LASIX) 20 MG tablet    Sig: Take 1 tablet (20 mg total) by mouth every morning.    Dispense:  30 tablet    Refill:  2  . gabapentin (NEURONTIN) 400 MG capsule    Sig: Take 2 capsules (800 mg total) by mouth 3 (three) times daily.    Dispense:  180 capsule    Refill:  0  . hydrOXYzine (ATARAX/VISTARIL) 25 MG tablet    Sig: Take 1 tablet (25 mg total) by mouth at bedtime as needed for anxiety. Start after you finish current Clonazepam prescription    Dispense:  30 tablet    Refill:  1  . losartan (COZAAR) 100 MG tablet    Sig: Take 1 tablet (100 mg total) by mouth every morning.    Dispense:  90 tablet    Refill:  3  . metFORMIN (GLUCOPHAGE) 1000 MG tablet    Sig: TAKE 1 TABLET BY MOUTH TWICE DAILY WITH A MEAL    Dispense:  60 tablet    Refill:  2  . montelukast (SINGULAIR) 10 MG tablet    Sig: Take 1 tablet (10 mg total) by mouth at bedtime.    Dispense:  90 tablet    Refill:  0  . omeprazole (PRILOSEC) 20 MG capsule    Sig: TAKE ONE CAPSULE BY MOUTH EVERY DAY BEFORE BREAKFAST    Dispense:  90 capsule    Refill:  0  . verapamil (CALAN-SR) 180 MG CR tablet    Sig: Take 2 tablets (360 mg total) by mouth at bedtime.    Dispense:  180 tablet    Refill:  0  . PARoxetine (PAXIL) 20 MG tablet    Sig: Take 1 tablet (20 mg total) by mouth daily.    Dispense:  90 tablet    Refill:  0  . glucose blood (RELION GLUCOSE TEST STRIPS) test strip    Sig: 1 each by Other route 4 (four) times daily. ICD code E11.65    Dispense:  400 each    Refill:  3  . umeclidinium bromide (INCRUSE ELLIPTA) 62.5 MCG/INH 1 puff    Follow-up: Return in about 3 months (around 01/28/2018).   Atley [place] H. Hulan Fray, AGDNP-student

## 2017-11-01 LAB — TSH: TSH: 3.51 u[IU]/mL (ref 0.450–4.500)

## 2017-11-09 ENCOUNTER — Encounter: Payer: Self-pay | Admitting: Pulmonary Disease

## 2017-11-09 ENCOUNTER — Other Ambulatory Visit (INDEPENDENT_AMBULATORY_CARE_PROVIDER_SITE_OTHER): Payer: Medicaid Other

## 2017-11-09 ENCOUNTER — Ambulatory Visit (INDEPENDENT_AMBULATORY_CARE_PROVIDER_SITE_OTHER)
Admission: RE | Admit: 2017-11-09 | Discharge: 2017-11-09 | Disposition: A | Discharge status: Home / Self Care | Payer: Medicaid Other | Source: Ambulatory Visit | Attending: Pulmonary Disease | Admitting: Pulmonary Disease

## 2017-11-09 ENCOUNTER — Ambulatory Visit: Payer: Medicaid Other | Admitting: Pulmonary Disease

## 2017-11-09 VITALS — BP 126/74 | HR 107 | Temp 100.0°F | Ht <= 58 in | Wt 190.0 lb

## 2017-11-09 DIAGNOSIS — J45998 Other asthma: Secondary | ICD-10-CM

## 2017-11-09 DIAGNOSIS — R05 Cough: Secondary | ICD-10-CM

## 2017-11-09 DIAGNOSIS — J209 Acute bronchitis, unspecified: Secondary | ICD-10-CM

## 2017-11-09 DIAGNOSIS — R059 Cough, unspecified: Secondary | ICD-10-CM

## 2017-11-09 DIAGNOSIS — J440 Chronic obstructive pulmonary disease with acute lower respiratory infection: Secondary | ICD-10-CM | POA: Diagnosis not present

## 2017-11-09 LAB — CBC WITH DIFFERENTIAL/PLATELET
Basophils Absolute: 0.1 10*3/uL (ref 0.0–0.1)
Basophils Relative: 0.7 % (ref 0.0–3.0)
Eosinophils Absolute: 0.4 10*3/uL (ref 0.0–0.7)
Eosinophils Relative: 3.8 % (ref 0.0–5.0)
HCT: 32.7 % — ABNORMAL LOW (ref 36.0–46.0)
Hemoglobin: 10.8 g/dL — ABNORMAL LOW (ref 12.0–15.0)
Lymphocytes Relative: 35.7 % (ref 12.0–46.0)
Lymphs Abs: 3.4 10*3/uL (ref 0.7–4.0)
MCHC: 33 g/dL (ref 30.0–36.0)
MCV: 74.1 fl — ABNORMAL LOW (ref 78.0–100.0)
Monocytes Absolute: 0.8 10*3/uL (ref 0.1–1.0)
Monocytes Relative: 8.5 % (ref 3.0–12.0)
Neutro Abs: 4.9 10*3/uL (ref 1.4–7.7)
Neutrophils Relative %: 51.3 % (ref 43.0–77.0)
Platelets: 439 10*3/uL — ABNORMAL HIGH (ref 150.0–400.0)
RBC: 4.41 Mil/uL (ref 3.87–5.11)
RDW: 17 % — ABNORMAL HIGH (ref 11.5–15.5)
WBC: 9.5 10*3/uL (ref 4.0–10.5)

## 2017-11-09 MED ORDER — MOMETASONE FURO-FORMOTEROL FUM 200-5 MCG/ACT IN AERO: 2.0000 | INHALATION_SPRAY | Freq: Two times a day (BID) | RESPIRATORY_TRACT | 5 refills | Status: DC

## 2017-11-09 MED ORDER — DOXYCYCLINE HYCLATE 100 MG PO TABS: 100.0000 mg | ORAL_TABLET | Freq: Two times a day (BID) | ORAL | 0 refills | Status: DC

## 2017-11-09 MED ORDER — PREDNISONE 10 MG PO TABS: ORAL_TABLET | ORAL | 0 refills | Status: DC

## 2017-11-09 NOTE — Progress Notes (Signed)
Subjective:    Patient ID: Raven Turner, female    DOB: 08-Jan-1971, 47 y.o.   MRN: [id number]  Synopsis: Former patient of Dr. Melvyn Novas with recurrent wheeze, shortness of breath and cough felt to be due to either vocal cord dysfunction or asthma.  Evaluated by [facility] in 2017, no paradoxical movements of the vocal cords noted   HPI Chief Complaint  Patient presents with  . Follow-up    pt c/o chest tightness, rattling cough X4 days.  denies mucus production, fever, sinus congestion.     She was off of prednisone for 5 days, quit last Tuesday and by Sunday she had cough, and feels hot.  She denies headache no recent feeling of fever or chills.  No body aches.  She has predominantly chest congestion cough and shortness of breath.  She has been taking her inhalers.  She says that this is the way she feels whenever she stops taking prednisone.  No recent changes in her environment.  No sick contacts.  Past Medical History:  Diagnosis Date  . Anemia   . Asthma   . Bilateral hand numbness 06/25/2015  . Bone pain 02/27/2015   Diffuse bone pain in legs mostly but also in arms    . Breast pain, left 03/27/2015  . Callus of heel 12/16/2014  . Compression fracture   . Compression fracture of L1 lumbar vertebra (HCC) 08/26/2014  . Compression fracture of T12 vertebra (HCC) 08/28/2014  . COPD (chronic obstructive pulmonary disease) (HCC) 2013  . Cushing syndrome (HCC) 2013   . Depression   . Diabetes (HCC) 2013   . Diabetes mellitus type 2, uncontrolled (HCC) 08/26/2014   Sees Dr. Groat.  Last visit 11/27/14.  No diabetic retinopathy    . Diabetes mellitus with neuropathy (HCC)   . Diastolic dysfunction without heart failure 10/14/2014   Grade 2 diastolic dysfunction 2/2 pulm HTN   . Generalized anxiety disorder 03/27/2015  . HTN (hypertension) 2005   . Hx of cataract surgery 11/03/2014  . LUQ abdominal pain 07/17/2015  . Medication management 05/12/2015  . Morbid  obesity (HCC) 09/08/2014  . 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 Non-English speaking patient 09/08/2014  . OSA (obstructive sleep apnea) 05/20/2015  . Osteopenia 11/19/2013   Dx with osteopenia in lumbar vertebra with partial compression of L1  . Pain due to onychomycosis of toenail 03/27/2015  . Secondary Cushing's syndrome/ iatrogenic   . Skin rash 07/17/2015  . Tachycardia 01/13/2015  . Upper airway cough syndrome 09/05/2014   Followed in Pulmonary clinic/ [facility]/ [place]  - Trial off advair      12 /25/2015 >>>  - trial off theoph 09/17/2014 and added flutter  - rec reduce dulera to 100 2 bid 07/13/2015     . Vision changes 06/25/2015  . Vitamin D deficiency 08/28/2014        Review of Systems  Constitutional: Negative for chills, fatigue and fever.  HENT: Positive for rhinorrhea. Negative for postnasal drip and sinus pressure.   Respiratory: Positive for cough, shortness of breath and wheezing.   Cardiovascular: Negative for chest pain, palpitations and leg swelling.  Hematological: Bruises/bleeds easily: .dbmfu.       Objective:   Physical Exam Vitals:   11/09/17 1000  BP: 126/74  Pulse: (!) 107  Temp: 100 F (37.8 C)  TempSrc: Oral  SpO2: 96%  Weight: 190 lb (86.2 kg)  Height: 4' 10"  (1.473 m)  RA  Gen: chronically ill appearing  HENT: OP clear, TM's clear, neck supple PULM: Rhonchi and wheezing bilaterally B, normal percussion CV: RRR, no mgr, trace edema GI: BS+, soft, nontender Derm: no cyanosis or rash Psyche: normal mood and affect  PFT 11/03/14  PFTs p am dulera 100 on 30 mg prednisone  FEV1 1.56(73%) ratio 85 =  No obst, including fef 25-75%, only abn is reduction in ERV spirometry 03/31/15 1.48 (62%) ratio 82  p am dulera and while symptomatic requesting saba  July 2017 IgE 55 (normal), CBC without eosinophils  Records reviewed where she saw our nurse practitioner a few weeks ago and was advised to continue decreasing the dose of prednisone        Assessment & Plan:  Cough - Plan: DG Chest 2 View, CBC w/Diff, IgE  severe persistent asthma  Acute bronchitis with COPD [facility])  Discussion: 47 year old female with steroid-dependent asthma.  We stopped steroids because she was doing well after a very long slow taper.  However, she is now worse with increasing chest congestion and wheeze.  She has a low-grade temperature today but no other flulike symptoms and no sick contacts.  I would really like to keep her off of steroids but unfortunately were going to need to give her a taper today.  I would like to check her blood for serum eosinophils and an elevated serum IgE to see if she would benefit from biologic therapy.  We also need to get a chest x-ray to make sure there is nothing else going on.  Asthma exacerbation with low-grade fever: We will get a chest x-ray to make sure there is no evidence of pneumonia Because of acute bronchitis symptoms will give you doxycycline for 5 days Take the prednisone taper as prescribed We will check your blood for serum eosinophils and serum IgE level to see if you have evidence of allergy If these lab tests are abnormal then you may benefit from a new class of medications called Biologics I when she to come back and see our nurse practitioner in 7-10 days to go over the results of the lab work and sort out at that point if we can start you on a biologic therapy.  At that point he will likely need to be continued on a continued dose of prednisone  We will see you back in 7-10 days  Current Outpatient Medications:  .  Albiglutide (TANZEUM) 50 MG PEN, Inject 50 mg into the skin once a week., Disp: 4 each, Rfl: 3 .  albuterol (PROVENTIL HFA;VENTOLIN HFA) 108 (90 Base) MCG/ACT inhaler, Inhale 2 puffs into the lungs every 4 (four) hours as needed for wheezing or shortness of breath (((PLAN B)))., Disp: 18 g, Rfl: 6 .  albuterol (PROVENTIL) (2.5 MG/3ML) 0.083% nebulizer solution, USE 1 VIAL VIA NEBULIZER EVERY  4 HOURS AS NEEDED FOR WHEEZING OR SHORTNESS OF BREATH, Disp: 90 mL, Rfl: 0 .  aspirin EC 81 MG tablet, Take 1 tablet (81 mg total) by mouth daily., Disp: 90 tablet, Rfl: 3 .  atorvastatin (LIPITOR) 20 MG tablet, Take 1 tablet (20 mg total) by mouth daily., Disp: 90 tablet, Rfl: 0 .  Blood Glucose Monitoring Suppl (ACCU-CHEK AVIVA PLUS) W/DEVICE KIT, Used as directed, Disp: 1 kit, Rfl: 0 .  buprenorphine (BUTRANS - DOSED MCG/HR) 5 MCG/HR PTWK patch, Place 5 mcg onto the skin once a week., Disp: , Rfl:  .  cabergoline (DOSTINEX) 0.5 MG tablet, Take 0.5 tablets (0.25 mg total) by mouth 2 (two) times a week., Disp:  10 tablet, Rfl: 3 .  cholecalciferol (VITAMIN D) 1000 UNITS tablet, Take 1,000 Units by mouth every morning., Disp: , Rfl:  .  cloNIDine (CATAPRES) 0.2 MG tablet, Take 1 tablet (0.2 mg total) by mouth 3 (three) times daily., Disp: 90 tablet, Rfl: 2 .  clotrimazole-betamethasone (LOTRISONE) cream, Apply to affected area 2 times daily prn, Disp: 45 g, Rfl: 0 .  dextromethorphan-guaiFENesin (MUCINEX DM) 30-600 MG 12hr tablet, Take 1 tablet by mouth 2 (two) times daily as needed (with flutter valve for cough and congestion). Reported on 04/01/2016, Disp: , Rfl:  .  ferrous sulfate 325 (65 FE) MG tablet, Take 1 tablet (325 mg total) by mouth daily with breakfast., Disp: 30 tablet, Rfl: 3 .  FOLIC ACID PO, Take 1 tablet by mouth every morning., Disp: , Rfl:  .  furosemide (LASIX) 20 MG tablet, Take 1 tablet (20 mg total) by mouth every morning., Disp: 30 tablet, Rfl: 2 .  gabapentin (NEURONTIN) 400 MG capsule, Take 2 capsules (800 mg total) by mouth 3 (three) times daily., Disp: 180 capsule, Rfl: 0 .  glucose blood (RELION GLUCOSE TEST STRIPS) test strip, 1 each by Other route 4 (four) times daily. ICD code E11.65, Disp: 400 each, Rfl: 3 .  HUMULIN R U-500 KWIKPEN 500 UNIT/ML kwikpen, Inject 120-180 Units into the skin 3 (three) times daily. 180 U before breakfast, 160 U before lunch, 140 U before  dinner, Disp: 27 mL, Rfl: 11 .  hydrOXYzine (ATARAX/VISTARIL) 25 MG tablet, Take 1 tablet (25 mg total) by mouth at bedtime as needed for anxiety. Start after you finish current Clonazepam prescription, Disp: 30 tablet, Rfl: 1 .  Insulin Pen Needle (B-D ULTRAFINE III SHORT PEN) 31G X 8 MM MISC, 1 application by Does not apply route daily., Disp: 100 each, Rfl: 3 .  Insulin Syringe-Needle U-100 (BD INSULIN SYRINGE ULTRAFINE) 31G X 15/64" 1 ML MISC, 1 each by Does not apply route 3 (three) times daily., Disp: 300 each, Rfl: 3 .  Lancet Devices (ACCU-CHEK SOFTCLIX) lancets, Use as instructed, Disp: 1 each, Rfl: 0 .  levothyroxine (SYNTHROID, LEVOTHROID) 25 MCG tablet, Take 25 mcg by mouth daily., Disp: , Rfl: 3 .  losartan (COZAAR) 100 MG tablet, Take 1 tablet (100 mg total) by mouth every morning., Disp: 90 tablet, Rfl: 3 .  metFORMIN (GLUCOPHAGE) 1000 MG tablet, TAKE 1 TABLET BY MOUTH TWICE DAILY WITH A MEAL, Disp: 60 tablet, Rfl: 2 .  methocarbamol (ROBAXIN) 750 MG tablet, Take 1 tablet (750 mg total) 2 (two) times daily as needed by mouth for muscle spasms. TAKE ONE TABLET BY MOUTH EVERY EIGHT HOURS AS NEEDED FOR MUSCLE SPASMS, Disp: 60 tablet, Rfl: 2 .  Misc. Devices (CANE) MISC, 1 each by Does not apply route daily., Disp: 1 each, Rfl: 0 .  mometasone-formoterol (DULERA) 200-5 MCG/ACT AERO, Inhale 2 puffs into the lungs 2 (two) times daily., Disp: 1 Inhaler, Rfl: 5 .  montelukast (SINGULAIR) 10 MG tablet, Take 1 tablet (10 mg total) by mouth at bedtime., Disp: 90 tablet, Rfl: 0 .  mupirocin ointment (BACTROBAN) 2 %, Place 1 application into the nose 2 (two) times daily., Disp: 22 g, Rfl: 0 .  omeprazole (PRILOSEC) 20 MG capsule, TAKE ONE CAPSULE BY MOUTH EVERY DAY BEFORE BREAKFAST, Disp: 90 capsule, Rfl: 0 .  Oyster Shell Calcium 500 MG TABS, Take 1 tablet (1,250 mg total) by mouth daily., Disp: 90 tablet, Rfl: 3 .  PARoxetine (PAXIL) 20 MG tablet, Take 1 tablet (20  mg total) by mouth daily., Disp:  90 tablet, Rfl: 0 .  ranitidine (ZANTAC) 300 MG tablet, TAKE 1 TABLET BY MOUTH EVERY NIGHT AT BEDTIME, Disp: 30 tablet, Rfl: 5 .  ReliOn Ultra Thin Lancets MISC, 1 each by Does not apply route 4 (four) times daily. ICD code E11.65, Disp: 400 each, Rfl: 3 .  Respiratory Therapy Supplies (FLUTTER) DEVI, Use as directed, Disp: 1 each, Rfl: 0 .  Spacer/Aero-Holding Chambers (AEROCHAMBER MV) inhaler, Use as instructed, Disp: 1 each, Rfl: 0 .  triamcinolone cream (KENALOG) 0.1 %, Apply 1 application 2 (two) times daily topically., Disp: 30 g, Rfl: 0 .  ULTICARE MICRO PEN NEEDLES 32G X 4 MM MISC, USE AS DIRECTED, Disp: 100 each, Rfl: 2 .  verapamil (CALAN-SR) 180 MG CR tablet, Take 2 tablets (360 mg total) by mouth at bedtime., Disp: 180 tablet, Rfl: 0 .  doxycycline (VIBRA-TABS) 100 MG tablet, Take 1 tablet (100 mg total) by mouth 2 (two) times daily., Disp: 10 tablet, Rfl: 0 .  predniSONE (DELTASONE) 10 MG tablet, 71mX3 days, 388mX3 days, 2037m2 days, 62m24mdays, then stop., Disp: 27 tablet, Rfl: 0  Current Facility-Administered Medications:  .  betamethasone acetate-betamethasone sodium phosphate (CELESTONE) injection 3 mg, 3 mg, Intramuscular, Once, Evans, Brent M, DPM .  umeclidinium bromide (INCRUSE ELLIPTA) 62.5 MCG/INH 1 puff, 1 puff, Inhalation, Daily, JohnLadell Pier  Facility-Administered Medications Ordered in Other Visits:  .  DOBUTamine (DOBUTREX) 1,000 mcg/mL in dextrose 5% 250 mL infusion, 30 mcg/kg/min, Intravenous, Continuous, Croitoru, Mihai, MD, Last Rate: 145.3 mL/hr at 04/22/15 1300, 30 mcg/kg/min at 04/22/15 1300

## 2017-11-09 NOTE — Addendum Note (Signed)
Addended by: Len Blalock on: 11/09/2017 11:31 AM   Modules accepted: Orders

## 2017-11-09 NOTE — Patient Instructions (Signed)
Asthma exacerbation with low-grade fever: We will get a chest x-ray to make sure there is no evidence of pneumonia Because of acute bronchitis symptoms will give you doxycycline for 5 days Take the prednisone taper as prescribed We will check your blood for serum eosinophils and serum IgE level to see if you have evidence of allergy If these lab tests are abnormal then you may benefit from a new class of medications called Biologics I when she to come back and see our nurse practitioner in 7-10 days to go over the results of the lab work and sort out at that point if we can start you on a biologic therapy.  At that point he will likely need to be continued on a continued dose of prednisone  We will see you back in 7-10 days

## 2017-11-10 LAB — IGE: IgE (Immunoglobulin E), Serum: 80 kU/L (ref <=114)

## 2017-11-11 ENCOUNTER — Other Ambulatory Visit: Payer: Self-pay | Admitting: Internal Medicine

## 2017-11-11 DIAGNOSIS — F411 Generalized anxiety disorder: Secondary | ICD-10-CM

## 2017-11-13 MED FILL — hydrOXYzine HCL 25 MG TABS: 25 | 30 days supply | Qty: 30 | Fill #0

## 2017-11-16 ENCOUNTER — Encounter: Payer: Self-pay | Admitting: Adult Health

## 2017-11-16 ENCOUNTER — Ambulatory Visit: Payer: Medicaid Other | Admitting: Adult Health

## 2017-11-16 DIAGNOSIS — R05 Cough: Secondary | ICD-10-CM | POA: Diagnosis not present

## 2017-11-16 DIAGNOSIS — J45998 Other asthma: Secondary | ICD-10-CM

## 2017-11-16 DIAGNOSIS — R058 Other specified cough: Secondary | ICD-10-CM

## 2017-11-16 DIAGNOSIS — R059 Cough, unspecified: Secondary | ICD-10-CM

## 2017-11-16 MED FILL — OMEPRAZOLE DR 20 MG CAPSULE: 20 | 30 days supply | Qty: 30 | Fill #1

## 2017-11-16 MED FILL — PARoxetine HCL 20 MG TABS: 20 | 30 days supply | Qty: 30 | Fill #1

## 2017-11-16 NOTE — Assessment & Plan Note (Addendum)
Cont on trigger control  Check HRCT chest .

## 2017-11-16 NOTE — Assessment & Plan Note (Signed)
Difficult to control asthma with recurrent exacerbations with chronic steroid use.  Unfortunately patient had a recurrent extubation after tapering off of prednisone.  Will decrease prednisone down to 5 mg at hold at this dose.  We will look at [place] (will need to see if covered from insurance ) .  Check HRCT chest for possible scarring, ILD changes.   Plan  Patient Instructions  Decrease prednisone 10 mg daily for 2 weeks then 5 mg daily and hold at this dose.  Continue on Dulera 2 puffs twice daily, rinse after use Mucinex DM twice daily as needed for cough and congestion Set up for a high resolution CT chest Follow-up with Dr. Lake Bells in 6 weeks with PFT  Will be in touch for Asthma Biologic medication  Please contact office for sooner follow up if symptoms do not improve or worsen or seek emergency care

## 2017-11-16 NOTE — Patient Instructions (Addendum)
Decrease prednisone 10 mg daily for 2 weeks then 5 mg daily and hold at this dose.  Continue on Dulera 2 puffs twice daily, rinse after use Mucinex DM twice daily as needed for cough and congestion Set up for a high resolution CT chest Follow-up with Dr. Lake Bells in 6 weeks with PFT  Will be in touch for Asthma Biologic medication  Please contact office for sooner follow up if symptoms do not improve or worsen or seek emergency care

## 2017-11-16 NOTE — Progress Notes (Signed)
@Patient  ID: Raven Turner, female    DOB: 11-Jun-1971, 47 y.o.   MRN: [id number]  Chief Complaint  Patient presents with  . 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 Onset was childhood but continued to have "good days and bad days" until 2010 and no good days since then even p pred started and no better since arrived in [place] summer 2015 withno evidence of any airflow obst by pfts 11/03/14   TEST  11/03/14 PFTs p am dulera 100 on 30 mg prednisone FEV1 1.56(73%) ratio 85 = No obst, including fef 25-75%, only abn is reduction in ERV DLCO nml  spirometry 03/31/15 1.48 (62%) ratio 82 p am dulera and while symptomatic requesting saba July 2017 IgE 55 , eosinophils 300 Echo 2016 Grade 2 DD Sleep study 2016 and 2018 NEG for OSA  11/16/2017 Follow up : Asthma  Patient returns for a one-week follow-up.  She was seen last visit with an asthmatic bronchitic exacerbation.  She was treated with doxycycline and a prednisone taper.  Patient has been on a very slow prednisone taper over the last 6 months and was tapered off of prednisone in January 2019.  After being on chronic steroids for many years.  Shortly after tapering off prednisone as her symptoms started to progressively increase with cough and shortness of breath and wheezing. Chest x-ray done in January and last month showed chronic bronchitic changes. She says since last visit she is feeling slightly improved.  However continues to have intermittent cough and wheezing.  Lab work last visit showed IgE at 80 and CBC showed eosinophils at 400  Patient is accompanied by a interpreter today for her visit.  She has had asthma since childhood.  She has a dog.  She denies any  unusual hobbies or travel.  She does not have any birds or chickens.  Denies any known chemical exposures.. She is from [place].   Allergies  Allergen Reactions  . Shellfish Allergy Anaphylaxis  . Septra [Sulfamethoxazole-Trimethoprim] Rash, Other (See Comments) and Itching    Facial redness with pruritus  Facial redness with pruritus   . Hydrocortisone Rash    Immunization History  Administered Date(s) Administered  . Hepatitis B 03/23/2015, 07/06/2015  . Influenza Split 05/25/2015  . Influenza,inj,Quad PF,6+ Mos 05/13/2016, 05/29/2017  . Pneumococcal Polysaccharide-23 07/31/2017  . Pneumococcal-Unspecified 04/12/2012  . Tdap 06/24/2015    Past Medical History:  Diagnosis Date  . Anemia   . Asthma   . Bilateral hand numbness 06/25/2015  . Bone pain 02/27/2015   Diffuse bone pain in legs mostly but also in arms    . Breast pain, left 03/27/2015  . Callus of heel 12/16/2014  . Compression fracture   . Compression fracture of L1 lumbar vertebra ([facility]) 08/26/2014  . Compression fracture of T12 vertebra ([facility]) 08/28/2014  . COPD (chronic obstructive pulmonary disease) ([facility]) 2013  . Cushing syndrome ([facility]) 2013   . Depression   . Diabetes ([facility]) 2013   . Diabetes mellitus type 2, uncontrolled ([facility]) 08/26/2014   Sees Dr. Katy Fitch.  Last visit 11/27/14.  No diabetic retinopathy    . Diabetes mellitus with neuropathy ([facility])   . Diastolic dysfunction without heart failure 10/14/2014  Grade 2 diastolic dysfunction 2/2 pulm HTN   . Generalized anxiety disorder 03/27/2015  . HTN (hypertension) 2005   . Hx of cataract surgery 11/03/2014  . LUQ abdominal pain 07/17/2015  . Medication management 05/12/2015  . Morbid obesity ([facility]) 09/08/2014  . 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 Non-English speaking patient 09/08/2014  . OSA (obstructive sleep apnea) 05/20/2015  . Osteopenia 11/19/2013   Dx with osteopenia in lumbar vertebra with partial compression of L1  . Pain due to onychomycosis of toenail  03/27/2015  . Secondary Cushing's syndrome/ iatrogenic   . Skin rash 07/17/2015  . Tachycardia 01/13/2015  . Upper airway cough syndrome 09/05/2014   Followed in Pulmonary clinic/ [facility]/ [place]  - Trial off advair      09/05/2014 >>>  - trial off theoph 09/17/2014 and added flutter  - rec reduce dulera to 100 2 bid 07/13/2015     . Vision changes 06/25/2015  . Vitamin D deficiency 08/28/2014    Tobacco History: Social History   Tobacco Use  Smoking Status Never Smoker  Smokeless Tobacco Never Used   Counseling given: Not Answered   Outpatient Encounter Medications as of 11/16/2017  Medication Sig  . Albiglutide (TANZEUM) 50 MG PEN Inject 50 mg into the skin once a week.  Marland Kitchen albuterol (PROVENTIL HFA;VENTOLIN HFA) 108 (90 Base) MCG/ACT inhaler Inhale 2 puffs into the lungs every 4 (four) hours as needed for wheezing or shortness of breath (((PLAN B))).  Marland Kitchen albuterol (PROVENTIL) (2.5 MG/3ML) 0.083% nebulizer solution USE 1 VIAL VIA NEBULIZER EVERY 4 HOURS AS NEEDED FOR WHEEZING OR SHORTNESS OF BREATH  . aspirin EC 81 MG tablet Take 1 tablet (81 mg total) by mouth daily.  Marland Kitchen atorvastatin (LIPITOR) 20 MG tablet Take 1 tablet (20 mg total) by mouth daily.  . Blood Glucose Monitoring Suppl (ACCU-CHEK AVIVA PLUS) W/DEVICE KIT Used as directed  . buprenorphine (BUTRANS - DOSED MCG/HR) 5 MCG/HR PTWK patch Place 5 mcg onto the skin once a week.  . cabergoline (DOSTINEX) 0.5 MG tablet Take 0.5 tablets (0.25 mg total) by mouth 2 (two) times a week.  . cholecalciferol (VITAMIN D) 1000 UNITS tablet Take 1,000 Units by mouth every morning.  . cloNIDine (CATAPRES) 0.2 MG tablet Take 1 tablet (0.2 mg total) by mouth 3 (three) times daily.  . clotrimazole-betamethasone (LOTRISONE) cream Apply to affected area 2 times daily prn  . dextromethorphan-guaiFENesin (MUCINEX DM) 30-600 MG 12hr tablet Take 1 tablet by mouth 2 (two) times daily as needed (with flutter valve for cough and congestion). Reported  on 04/01/2016  . doxycycline (VIBRA-TABS) 100 MG tablet Take 1 tablet (100 mg total) by mouth 2 (two) times daily.  . ferrous sulfate 325 (65 FE) MG tablet Take 1 tablet (325 mg total) by mouth daily with breakfast.  . FOLIC ACID PO Take 1 tablet by mouth every morning.  . furosemide (LASIX) 20 MG tablet Take 1 tablet (20 mg total) by mouth every morning.  . gabapentin (NEURONTIN) 400 MG capsule Take 2 capsules (800 mg total) by mouth 3 (three) times daily.  Marland Kitchen glucose blood (RELION GLUCOSE TEST STRIPS) test strip 1 each by Other route 4 (four) times daily. ICD code E11.65  . HUMULIN R U-500 KWIKPEN 500 UNIT/ML kwikpen Inject 120-180 Units into the skin 3 (three) times daily. 180 U before breakfast, 160 U before lunch, 140 U before dinner  . hydrOXYzine (ATARAX/VISTARIL) 25 MG tablet TAKE 1 TABLET BY MOUTH AT BEDTIME AS NEEDED FOR ANXIETY. START AFTER  YOU FINISH CURRENT CLONAZEPAM PRESCRIPTION.  Marland Kitchen Insulin Pen Needle (B-D ULTRAFINE III SHORT PEN) 31G X 8 MM MISC 1 application by Does not apply route daily.  . Insulin Syringe-Needle U-100 (BD INSULIN SYRINGE ULTRAFINE) 31G X 15/64" 1 ML MISC 1 each by Does not apply route 3 (three) times daily.  Elmore Guise Devices (ACCU-CHEK SOFTCLIX) lancets Use as instructed  . levothyroxine (SYNTHROID, LEVOTHROID) 25 MCG tablet Take 25 mcg by mouth daily.  Marland Kitchen losartan (COZAAR) 100 MG tablet Take 1 tablet (100 mg total) by mouth every morning.  . metFORMIN (GLUCOPHAGE) 1000 MG tablet TAKE 1 TABLET BY MOUTH TWICE DAILY WITH A MEAL  . methocarbamol (ROBAXIN) 750 MG tablet Take 1 tablet (750 mg total) 2 (two) times daily as needed by mouth for muscle spasms. TAKE ONE TABLET BY MOUTH EVERY EIGHT HOURS AS NEEDED FOR MUSCLE SPASMS  . Misc. Devices (CANE) MISC 1 each by Does not apply route daily.  . mometasone-formoterol (DULERA) 200-5 MCG/ACT AERO Inhale 2 puffs into the lungs 2 (two) times daily.  . montelukast (SINGULAIR) 10 MG tablet Take 1 tablet (10 mg total) by mouth  at bedtime.  . mupirocin ointment (BACTROBAN) 2 % Place 1 application into the nose 2 (two) times daily.  Marland Kitchen omeprazole (PRILOSEC) 20 MG capsule TAKE ONE CAPSULE BY MOUTH EVERY DAY BEFORE BREAKFAST  . Oyster Shell Calcium 500 MG TABS Take 1 tablet (1,250 mg total) by mouth daily.  Marland Kitchen PARoxetine (PAXIL) 20 MG tablet Take 1 tablet (20 mg total) by mouth daily.  . ranitidine (ZANTAC) 300 MG tablet TAKE 1 TABLET BY MOUTH EVERY NIGHT AT BEDTIME  . ReliOn Ultra Thin Lancets MISC 1 each by Does not apply route 4 (four) times daily. ICD code E11.65  . Respiratory Therapy Supplies (FLUTTER) DEVI Use as directed  . Spacer/Aero-Holding Chambers (AEROCHAMBER MV) inhaler Use as instructed  . triamcinolone cream (KENALOG) 0.1 % Apply 1 application 2 (two) times daily topically.  Marland Kitchen ULTICARE MICRO PEN NEEDLES 32G X 4 MM MISC USE AS DIRECTED  . verapamil (CALAN-SR) 180 MG CR tablet Take 2 tablets (360 mg total) by mouth at bedtime.  . [DISCONTINUED] predniSONE (DELTASONE) 10 MG tablet 33mX3 days, 357mX3 days, 2041m2 days, 65m33mdays, then stop.   Facility-Administered Encounter Medications as of 11/16/2017  Medication  . betamethasone acetate-betamethasone sodium phosphate (CELESTONE) injection 3 mg  . DOBUTamine (DOBUTREX) 1,000 mcg/mL in dextrose 5% 250 mL infusion  . umeclidinium bromide (INCRUSE ELLIPTA) 62.5 MCG/INH 1 puff     Review of Systems  Constitutional:   No  weight loss, night sweats,  Fevers, chills,  +fatigue, or  lassitude.  HEENT:   No headaches,  Difficulty swallowing,  Tooth/dental problems, or  Sore throat,                No sneezing, itching, ear ache, + nasal congestion, post nasal drip,   CV:  No chest pain,  Orthopnea, PND, swelling in lower extremities, anasarca, dizziness, palpitations, syncope.   GI  No heartburn, indigestion, abdominal pain, nausea, vomiting, diarrhea, change in bowel habits, loss of appetite, bloody stools.   Resp:   No chest wall deformity  Skin: no  rash or lesions.  GU: no dysuria, change in color of urine, no urgency or frequency.  No flank pain, no hematuria   MS:  No joint pain or swelling.  No decreased range of motion.  No back pain.    Physical Exam  BP 138/80 (BP Location:  Left Arm, Cuff Size: Normal)   Pulse 65   Ht 4' 10"  (1.473 m)   Wt 193 lb (87.5 kg)   LMP 11/03/2017   SpO2 100%   BMI 40.34 kg/m   GEN: A/Ox3; pleasant , NAD, morbidly obese   HEENT:  [place]/AT,  EACs-clear, TMs-wnl, NOSE-clear, THROAT-clear, no lesions, no postnasal drip or exudate noted.  Class 2 MP Airway   NECK:  Supple w/ fair ROM; no JVD; normal carotid impulses w/o bruits; no thyromegaly or nodules palpated; no lymphadenopathy.    RESP few scattered rhonchi , no accessory muscle use, no dullness to percussion  CARD:  RRR, no m/r/g, no peripheral edema, pulses intact, no cyanosis or clubbing.  GI:   Soft & nt; nml bowel sounds; no organomegaly or masses detected.   Musco: Warm bil, no deformities or joint swelling noted.   Neuro: alert, no focal deficits noted.    Skin: Warm, no lesions or rashes    Lab Results:  CBC    Component Value Date/Time   WBC 9.5 11/09/2017 1052   RBC 4.41 11/09/2017 1052   HGB 10.8 (L) 11/09/2017 1052   HGB 10.1 (L) 07/31/2017 1632   HCT 32.7 (L) 11/09/2017 1052   HCT 30.8 (L) 07/31/2017 1632   PLT 439.0 (H) 11/09/2017 1052   PLT 446 (H) 01/30/2017 1227   MCV 74.1 (L) 11/09/2017 1052   MCV 75 (L) 01/30/2017 1227   MCH 24.3 (L) 01/30/2017 1227   MCH 25.8 (L) 01/02/2016 2233   MCHC 33.0 11/09/2017 1052   RDW 17.0 (H) 11/09/2017 1052   RDW 15.8 (H) 01/30/2017 1227   LYMPHSABS 3.4 11/09/2017 1052   MONOABS 0.8 11/09/2017 1052   EOSABS 0.4 11/09/2017 1052   BASOSABS 0.1 11/09/2017 1052    BMET    Component Value Date/Time   NA 140 03/27/2017 1520   K 4.5 03/27/2017 1520   CL 98 03/27/2017 1520   CO2 24 03/27/2017 1520   GLUCOSE 210 (H) 03/27/2017 1520   GLUCOSE 307 (H) 11/07/2016 1641    BUN 12 03/27/2017 1520   CREATININE 0.80 03/27/2017 1520   CREATININE 0.84 11/07/2016 1641   CALCIUM 9.6 03/27/2017 1520   GFRNONAA 90 03/27/2017 1520   GFRNONAA 85 11/07/2016 1641   GFRAA 104 03/27/2017 1520   GFRAA >89 11/07/2016 1641    BNP    Component Value Date/Time   BNP 53.3 09/08/2014 2213    ProBNP    Component Value Date/Time   PROBNP 28.0 02/16/2015 0930    Imaging: Dg Chest 2 View  Result Date: 11/09/2017 CLINICAL DATA:  Chest tightness, cough, shortness of breath over the last 4 days EXAM: CHEST  2 VIEW COMPARISON:  Chest x-ray of 10/11/2017 FINDINGS: No pneumonia or effusion is seen. The lungs are not optimally aerated creating some compression of vessels and markings at the lung bases. Mediastinal and hilar contours are unremarkable. Mild cardiomegaly is stable. And old lower thoracic compression deformity is present. IMPRESSION: Suboptimal inspiration with some compression of vessels and markings at the lung bases. Stable mild cardiomegaly. Electronically Signed   By: Ivar Drape M.D.   On: 11/09/2017 11:48     Assessment & Plan:   No problem-specific Assessment & Plan notes found for this encounter.     Rexene Edison, NP 11/16/2017

## 2017-11-20 ENCOUNTER — Other Ambulatory Visit: Payer: Self-pay

## 2017-11-20 MED ORDER — PREDNISONE 10 MG PO TABS: 10.0000 mg | ORAL_TABLET | Freq: Every day | ORAL | 1 refills | Status: DC

## 2017-11-20 NOTE — Progress Notes (Signed)
Reviewed, agree 

## 2017-11-24 ENCOUNTER — Ambulatory Visit (INDEPENDENT_AMBULATORY_CARE_PROVIDER_SITE_OTHER)
Admission: RE | Admit: 2017-11-24 | Discharge: 2017-11-24 | Disposition: A | Discharge status: Home / Self Care | Payer: Medicaid Other | Source: Ambulatory Visit | Attending: Adult Health | Admitting: Adult Health

## 2017-11-24 DIAGNOSIS — R059 Cough, unspecified: Secondary | ICD-10-CM

## 2017-11-24 DIAGNOSIS — R05 Cough: Secondary | ICD-10-CM | POA: Diagnosis not present

## 2017-11-28 ENCOUNTER — Telehealth: Payer: Self-pay | Admitting: Pulmonary Disease

## 2017-11-28 MED ORDER — PREDNISONE 10 MG PO TABS: ORAL_TABLET | ORAL | 0 refills | Status: DC

## 2017-11-28 MED ORDER — PREDNISONE 5 MG PO TABS: 5.0000 mg | ORAL_TABLET | Freq: Every day | ORAL | 0 refills | Status: DC

## 2017-11-28 NOTE — Telephone Encounter (Signed)
Prednisone 24m daily x 5 days then resume 534mdaily

## 2017-11-28 NOTE — Telephone Encounter (Signed)
Spoke with pt's husband and he states pt is still coughing and congested. She has been sick for a while but she doesn't bring up any mucus. They would like to know what to do next. They saw TP on 11/14/17, instructions below. CT chest was sone but it was negative for any respiratory illness. BQ please advise. He stated it was ok to leave message because he is working and will not be able to pick up the phone.     Patient Instructions  Decrease prednisone 10 mg daily for 2 weeks then 5 mg daily and hold at this dose.  Continue on Dulera 2 puffs twice daily, rinse after use Mucinex DM twice daily as needed for cough and congestion Set up for a high resolution CT chest Follow-up with Dr. [place] Bells in 6 weeks with PFT  Will be in touch for Asthma Biologic medication  Please contact office for sooner follow up if symptoms do not improve or worsen or seek emergency care

## 2017-11-28 NOTE — Telephone Encounter (Signed)
Spoke with patient's husband [place]. He is aware of BQ's recs. Will go ahead and call in medication. Nothing else needed at time of call.

## 2017-11-29 ENCOUNTER — Telehealth: Payer: Self-pay | Admitting: Internal Medicine

## 2017-11-29 NOTE — Telephone Encounter (Signed)
Anderson Malta from [facility] called requesting for a prior authorization for patient. YH#[id number] Anderson Malta

## 2017-11-30 ENCOUNTER — Telehealth: Payer: Self-pay | Admitting: Internal Medicine

## 2017-11-30 NOTE — Telephone Encounter (Signed)
16 page, paperwork reciebed through fax 11-30-17.

## 2017-12-04 ENCOUNTER — Encounter: Payer: Self-pay | Admitting: Internal Medicine

## 2017-12-04 ENCOUNTER — Ambulatory Visit: Payer: Medicaid Other | Admitting: Internal Medicine

## 2017-12-04 VITALS — BP 124/78 | HR 66 | Ht <= 58 in | Wt 197.2 lb

## 2017-12-04 DIAGNOSIS — J4551 Severe persistent asthma with (acute) exacerbation: Secondary | ICD-10-CM

## 2017-12-04 DIAGNOSIS — R002 Palpitations: Secondary | ICD-10-CM | POA: Diagnosis not present

## 2017-12-04 DIAGNOSIS — I5189 Other ill-defined heart diseases: Secondary | ICD-10-CM

## 2017-12-04 DIAGNOSIS — I10 Essential (primary) hypertension: Secondary | ICD-10-CM | POA: Diagnosis not present

## 2017-12-04 DIAGNOSIS — I2510 Atherosclerotic heart disease of native coronary artery without angina pectoris: Secondary | ICD-10-CM

## 2017-12-04 MED ORDER — SPIRONOLACTONE 25 MG PO TABS: 12.5000 mg | ORAL_TABLET | Freq: Every day | ORAL | 3 refills | Status: DC

## 2017-12-04 NOTE — Progress Notes (Signed)
Cardiology Office Note   Date:  12/04/2017   ID:  Raven Turner, DOB [birth year], MRN [id number]  PCP:  Ladell Pier, MD  Cardiologist:   Dorris Carnes, MD   Pt presetns for f/u of SOB , chest pressure     History of Present Illness: Raven Turner is a 47 y.o. female with a history of chest pressure and HTN  Also history of DM and COPD/aastham   I saw her in May 2018   Myovue in Aug 2016 showed no ischemia WHen I saw her lasat spring I felt her pulmomary problems may be drinving spells I referred her to pulmonary clinic   She has been followed by Darlys Gales and B McQuaid  Since seen her breathing is a little better  She is on steroids now BP is labile at night 150s to 190s     Notes some chest tightness intermitt   Pt says everything has been fine but has pain in chest  Pressing Squeezing then lets go  Last week had all the time  Today didn't have    Doesn't do much during day  Breatihg doesn't allow her to   Current Meds  Medication Sig  . Albiglutide (TANZEUM) 50 MG PEN Inject 50 mg into the skin once a week.  Marland Kitchen albuterol (PROVENTIL HFA;VENTOLIN HFA) 108 (90 Base) MCG/ACT inhaler Inhale 2 puffs into the lungs every 4 (four) hours as needed for wheezing or shortness of breath (((PLAN B))).  Marland Kitchen albuterol (PROVENTIL) (2.5 MG/3ML) 0.083% nebulizer solution USE 1 VIAL VIA NEBULIZER EVERY 4 HOURS AS NEEDED FOR WHEEZING OR SHORTNESS OF BREATH  . aspirin EC 81 MG tablet Take 1 tablet (81 mg total) by mouth daily.  Marland Kitchen atorvastatin (LIPITOR) 20 MG tablet Take 1 tablet (20 mg total) by mouth daily.  . Blood Glucose Monitoring Suppl (ACCU-CHEK AVIVA PLUS) W/DEVICE KIT Used as directed  . buprenorphine (BUTRANS - DOSED MCG/HR) 5 MCG/HR PTWK patch Place 5 mcg onto the skin once a week.  . cabergoline (DOSTINEX) 0.5 MG tablet Take 0.5 tablets (0.25 mg total) by mouth 2 (two) times a week.  . cholecalciferol (VITAMIN D) 1000 UNITS tablet Take 1,000 Units by mouth every  morning.  . cloNIDine (CATAPRES) 0.2 MG tablet Take 1 tablet (0.2 mg total) by mouth 3 (three) times daily.  . clotrimazole-betamethasone (LOTRISONE) cream Apply to affected area 2 times daily prn  . dextromethorphan-guaiFENesin (MUCINEX DM) 30-600 MG 12hr tablet Take 1 tablet by mouth 2 (two) times daily as needed (with flutter valve for cough and congestion). Reported on 04/01/2016  . doxycycline (VIBRA-TABS) 100 MG tablet Take 1 tablet (100 mg total) by mouth 2 (two) times daily.  . ferrous sulfate 325 (65 FE) MG tablet Take 1 tablet (325 mg total) by mouth daily with breakfast.  . FOLIC ACID PO Take 1 tablet by mouth every morning.  . furosemide (LASIX) 20 MG tablet Take 1 tablet (20 mg total) by mouth every morning.  . gabapentin (NEURONTIN) 400 MG capsule Take 2 capsules (800 mg total) by mouth 3 (three) times daily.  Marland Kitchen glucose blood (RELION GLUCOSE TEST STRIPS) test strip 1 each by Other route 4 (four) times daily. ICD code E11.65  . HUMULIN R U-500 KWIKPEN 500 UNIT/ML kwikpen Inject 120-180 Units into the skin 3 (three) times daily. 180 U before breakfast, 160 U before lunch, 140 U before dinner  . hydrOXYzine (ATARAX/VISTARIL) 25 MG tablet TAKE 1 TABLET BY MOUTH AT BEDTIME AS  NEEDED FOR ANXIETY. START AFTER YOU FINISH CURRENT CLONAZEPAM PRESCRIPTION.  Marland Kitchen Insulin Pen Needle (B-D ULTRAFINE III SHORT PEN) 31G X 8 MM MISC 1 application by Does not apply route daily.  . Insulin Syringe-Needle U-100 (BD INSULIN SYRINGE ULTRAFINE) 31G X 15/64" 1 ML MISC 1 each by Does not apply route 3 (three) times daily.  Elmore Guise Devices (ACCU-CHEK SOFTCLIX) lancets Use as instructed  . levothyroxine (SYNTHROID, LEVOTHROID) 25 MCG tablet Take 25 mcg by mouth daily.  Marland Kitchen losartan (COZAAR) 100 MG tablet Take 1 tablet (100 mg total) by mouth every morning.  . metFORMIN (GLUCOPHAGE) 1000 MG tablet TAKE 1 TABLET BY MOUTH TWICE DAILY WITH A MEAL  . methocarbamol (ROBAXIN) 750 MG tablet Take 1 tablet (750 mg total) 2  (two) times daily as needed by mouth for muscle spasms. TAKE ONE TABLET BY MOUTH EVERY EIGHT HOURS AS NEEDED FOR MUSCLE SPASMS  . Misc. Devices (CANE) MISC 1 each by Does not apply route daily.  . mometasone-formoterol (DULERA) 200-5 MCG/ACT AERO Inhale 2 puffs into the lungs 2 (two) times daily.  . montelukast (SINGULAIR) 10 MG tablet Take 1 tablet (10 mg total) by mouth at bedtime.  . mupirocin ointment (BACTROBAN) 2 % Place 1 application into the nose 2 (two) times daily.  Marland Kitchen omeprazole (PRILOSEC) 20 MG capsule TAKE ONE CAPSULE BY MOUTH EVERY DAY BEFORE BREAKFAST  . Oyster Shell Calcium 500 MG TABS Take 1 tablet (1,250 mg total) by mouth daily.  Marland Kitchen PARoxetine (PAXIL) 20 MG tablet Take 1 tablet (20 mg total) by mouth daily.  . predniSONE (DELTASONE) 10 MG tablet Take 2 tablets daily for 5 days.  . ranitidine (ZANTAC) 300 MG tablet TAKE 1 TABLET BY MOUTH EVERY NIGHT AT BEDTIME  . ReliOn Ultra Thin Lancets MISC 1 each by Does not apply route 4 (four) times daily. ICD code E11.65  . Respiratory Therapy Supplies (FLUTTER) DEVI Use as directed  . Spacer/Aero-Holding Chambers (AEROCHAMBER MV) inhaler Use as instructed  . triamcinolone cream (KENALOG) 0.1 % Apply 1 application 2 (two) times daily topically.  Marland Kitchen ULTICARE MICRO PEN NEEDLES 32G X 4 MM MISC USE AS DIRECTED  . verapamil (CALAN-SR) 180 MG CR tablet Take 2 tablets (360 mg total) by mouth at bedtime.   Current Facility-Administered Medications for the 12/04/17 encounter (Office Visit) with Fay Records, MD  Medication  . betamethasone acetate-betamethasone sodium phosphate (CELESTONE) injection 3 mg  . umeclidinium bromide (INCRUSE ELLIPTA) 62.5 MCG/INH 1 puff     Allergies:   Shellfish allergy; Septra [sulfamethoxazole-trimethoprim]; and Hydrocortisone   Past Medical History:  Diagnosis Date  . Anemia   . Asthma   . Bilateral hand numbness 06/25/2015  . Bone pain 02/27/2015   Diffuse bone pain in legs mostly but also in arms    .  Breast pain, left 03/27/2015  . Callus of heel 12/16/2014  . Compression fracture   . Compression fracture of L1 lumbar vertebra (HCC) 08/26/2014  . Compression fracture of T12 vertebra ([place]) 08/28/2014  . COPD (chronic obstructive pulmonary disease) ([place]) 2013  . Cushing syndrome ([place]) 2013   . Depression   . Diabetes ([place]) 2013   . Diabetes mellitus type 2, uncontrolled ([place]) 08/26/2014   Sees Dr. Katy Fitch.  Last visit 11/27/14.  No diabetic retinopathy    . Diabetes mellitus with neuropathy ([place])   . Diastolic dysfunction without heart failure 10/14/2014   Grade 2 diastolic dysfunction 2/2 pulm HTN   . Generalized anxiety disorder 03/27/2015  .  HTN (hypertension) 2005   . Hx of cataract surgery 11/03/2014  . LUQ abdominal pain 07/17/2015  . Medication management 05/12/2015  . Morbid obesity ([place]) 09/08/2014  . 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 Non-English speaking patient 09/08/2014  . OSA (obstructive sleep apnea) 05/20/2015  . Osteopenia 11/19/2013   Dx with osteopenia in lumbar vertebra with partial compression of L1  . Pain due to onychomycosis of toenail 03/27/2015  . Secondary Cushing's syndrome/ iatrogenic   . Skin rash 07/17/2015  . Tachycardia 01/13/2015  . Upper airway cough syndrome 09/05/2014   Followed in Pulmonary clinic/ [facility]/ [place]  - Trial off advair      09/05/2014 >>>  - trial off theoph 09/17/2014 and added flutter  - rec reduce dulera to 100 2 bid 07/13/2015     . Vision changes 06/25/2015  . Vitamin D deficiency 08/28/2014    Past Surgical History:  Procedure Laterality Date  . CATARACT EXTRACTION Bilateral 2014  . HAND SURGERY Right 12/18/2015  . VAGINAL HYSTERECTOMY       Social History:  The patient  reports that she has never smoked. She has never used smokeless tobacco. She reports that she does not drink alcohol or use drugs.   Family History:  The patient's family history includes Asthma in her father; Diabetes in her mother; Hypertension in her  brother and sister; Stroke in her father.    ROS:  Please see the history of present illness. All other systems are reviewed and  Negative to the above problem except as noted.    PHYSICAL EXAM: VS:  BP 124/78   Pulse 66   Ht 4' 10"  (1.473 m)   Wt 197 lb 3.2 oz (89.4 kg)   LMP 11/23/2017   SpO2 98%   BMI 41.21 kg/m   GEN: Morbidly obese 47 yo in no acute distress  HEENT: normal  Neck: no JVD,  No carotid bruits, Neck full Cardiac: RRR; no murmurs, rubs, or gallops,Tr edema  Respiratory: Diffuse wheezes  No ralers    GI: soft, nontender, nondistended, + BS  No hepatomegaly  MS: no deformity Moving all extremities   Skin: warm and dry, no rash Neuro:  Strength and sensation are intact Psych: euthymic mood, full affect   EKG:  EKG is ordered today.  SR 66 bnp      Lipid Panel    Component Value Date/Time   CHOL 163 12/15/2014 1051   TRIG 192.0 (H) 12/15/2014 1051   HDL 45.90 12/15/2014 1051   CHOLHDL 4 12/15/2014 1051   VLDL 38.4 12/15/2014 1051   LDLCALC 79 12/15/2014 1051      Wt Readings from Last 3 Encounters:  12/04/17 197 lb 3.2 oz (89.4 kg)  11/16/17 193 lb (87.5 kg)  11/09/17 190 lb (86.2 kg)      ASSESSMENT AND PLAN:  1  Chest tightness/ SOB  I feel most of pt's symptoms are related to reacitve airways  Continue prednisone  Has f/u in pulmonary  2  CAD  Recent CT scan shows coronary artery calcifications   WIll need to treat chol   Follow BP   2  Pulm  Appt this PM  3   Diastolic dysfunciton  Volume status is OK    5  HTN  BP is high at times   Will add aldactone 12.5 mg   CHeck BP in 8 wks       Current medicines are reviewed at length with the patient today.  The  patient does not have concerns regarding medicines.  Signed, Dorris Carnes, MD  12/04/2017 12:24 PM    [facility] [address], [place], [place]  [address] Phone: [phone]; Fax: [phone]

## 2017-12-04 NOTE — Patient Instructions (Addendum)
Your physician has recommended you make the following change in your medication:  1.) start aldactone 12.5 mg once a day for blood pressure  Your physician recommends that you return for lab work today (bmet), and in 2 weeks (BMET)   Your physician wants you to follow-up in: 2 months with BP check with nurse on a day Dr. Harrington Challenger is in clinic.    Your physician wants you to follow-up in: 6 months with Dr. Harrington Challenger.  You will receive a reminder letter in the mail two months in advance. If you don't receive a letter, please call our office to schedule the follow-up appointment.

## 2017-12-05 LAB — LIPID PANEL
Chol/HDL Ratio: 2.4 ratio (ref 0.0–4.4)
Cholesterol, Total: 169 mg/dL (ref 100–199)
HDL: 69 mg/dL (ref >39)
LDL Calculated: 81 mg/dL (ref 0–99)
Triglycerides: 93 mg/dL (ref 0–149)
VLDL Cholesterol Cal: 19 mg/dL (ref 5–40)

## 2017-12-05 LAB — BASIC METABOLIC PANEL
BUN/Creatinine Ratio: 20 (ref 9–23)
BUN: 20 mg/dL (ref 6–24)
CO2: 23 mmol/L (ref 20–29)
Calcium: 9.2 mg/dL (ref 8.7–10.2)
Chloride: 103 mmol/L (ref 96–106)
Creatinine, Ser: 0.99 mg/dL (ref 0.57–1.00)
GFR calc Af Amer: 80 mL/min/{1.73_m2} (ref >59)
GFR calc non Af Amer: 69 mL/min/{1.73_m2} (ref >59)
Glucose: 109 mg/dL — ABNORMAL HIGH (ref 65–99)
Potassium: 4.1 mmol/L (ref 3.5–5.2)
Sodium: 143 mmol/L (ref 134–144)

## 2017-12-07 ENCOUNTER — Telehealth: Payer: Self-pay | Admitting: *Deleted

## 2017-12-07 DIAGNOSIS — E785 Hyperlipidemia, unspecified: Secondary | ICD-10-CM

## 2017-12-07 MED ORDER — ATORVASTATIN CALCIUM 40 MG PO TABS: 40.0000 mg | ORAL_TABLET | Freq: Every day | ORAL | 3 refills | Status: DC

## 2017-12-07 NOTE — Telephone Encounter (Signed)
Spoke with patient and family member who translated. Pt will pick up new prescription for lipitor 40 mg and take one tablet daily. She will come on June 14 for blood work. Informed of lab hours.

## 2017-12-07 NOTE — Telephone Encounter (Signed)
-----   Message from Fay Records, MD sent at 12/05/2017  2:01 PM EDT ----- Electrolytes and kidney function are OK Lipids :   LDL is  81   With CAD on CT scan needs to be 70 or below  I would recomm increasing lipitor to 40 daily  F/U lipis in 2 months

## 2017-12-11 MED FILL — hydrOXYzine HCL 25 MG TABS: 25 | 30 days supply | Qty: 30 | Fill #1

## 2017-12-18 MED FILL — CABERGOLINE 0.5 MG TABLET: 0.5 | 28 days supply | Qty: 4 | Fill #1

## 2017-12-18 MED FILL — OMEPRAZOLE DR 20 MG CAPSULE: 20 | 30 days supply | Qty: 30 | Fill #2

## 2017-12-18 MED FILL — PARoxetine HCL 20 MG TABS: 20 | 30 days supply | Qty: 30 | Fill #2

## 2017-12-19 ENCOUNTER — Other Ambulatory Visit: Payer: Medicaid Other

## 2017-12-19 DIAGNOSIS — I5189 Other ill-defined heart diseases: Secondary | ICD-10-CM

## 2017-12-19 DIAGNOSIS — I10 Essential (primary) hypertension: Secondary | ICD-10-CM

## 2017-12-19 LAB — BASIC METABOLIC PANEL
BUN/Creatinine Ratio: 15 (ref 9–23)
BUN: 16 mg/dL (ref 6–24)
CO2: 25 mmol/L (ref 20–29)
Calcium: 9.4 mg/dL (ref 8.7–10.2)
Chloride: 99 mmol/L (ref 96–106)
Creatinine, Ser: 1.05 mg/dL — ABNORMAL HIGH (ref 0.57–1.00)
GFR calc Af Amer: 74 mL/min/{1.73_m2} (ref >59)
GFR calc non Af Amer: 64 mL/min/{1.73_m2} (ref >59)
Glucose: 218 mg/dL — ABNORMAL HIGH (ref 65–99)
Potassium: 4.5 mmol/L (ref 3.5–5.2)
Sodium: 139 mmol/L (ref 134–144)

## 2018-01-01 ENCOUNTER — Other Ambulatory Visit: Payer: Self-pay | Admitting: Pharmacist

## 2018-01-01 DIAGNOSIS — I10 Essential (primary) hypertension: Secondary | ICD-10-CM

## 2018-01-01 MED ORDER — LOSARTAN POTASSIUM 100 MG PO TABS: 100.0000 mg | ORAL_TABLET | Freq: Every morning | ORAL | 0 refills | Status: DC

## 2018-01-15 ENCOUNTER — Telehealth: Payer: Self-pay | Admitting: Internal Medicine

## 2018-01-15 ENCOUNTER — Other Ambulatory Visit: Payer: Self-pay

## 2018-01-15 DIAGNOSIS — G8929 Other chronic pain: Secondary | ICD-10-CM

## 2018-01-15 DIAGNOSIS — F411 Generalized anxiety disorder: Secondary | ICD-10-CM

## 2018-01-15 DIAGNOSIS — M545 Low back pain, unspecified: Secondary | ICD-10-CM

## 2018-01-15 DIAGNOSIS — J45998 Other asthma: Secondary | ICD-10-CM

## 2018-01-15 MED ORDER — MONTELUKAST SODIUM 10 MG PO TABS: 10.0000 mg | ORAL_TABLET | Freq: Every day | ORAL | 0 refills | Status: DC

## 2018-01-15 MED ORDER — GABAPENTIN 400 MG PO CAPS: 800.0000 mg | ORAL_CAPSULE | Freq: Three times a day (TID) | ORAL | 0 refills | Status: DC

## 2018-01-15 MED ORDER — OMEPRAZOLE 20 MG PO CPDR: DELAYED_RELEASE_CAPSULE | ORAL | 0 refills | Status: DC

## 2018-01-15 MED ORDER — HYDROXYZINE HCL 25 MG PO TABS: ORAL_TABLET | ORAL | 0 refills | Status: DC

## 2018-01-15 MED ORDER — PAROXETINE HCL 20 MG PO TABS: 20.0000 mg | ORAL_TABLET | Freq: Every day | ORAL | 0 refills | Status: DC

## 2018-01-15 NOTE — Telephone Encounter (Signed)
Patient called requesting a refill on the following medications:  hydrOXYzine (ATARAX/VISTARIL) 25 MG tablet omeprazole (PRILOSEC) 20 MG capsule  PARoxetine (PAXIL) 20 MG tablet montelukast (SINGULAIR) 10 MG tablet  Gabapentin   Pt. Uses [facility] on [address]. Please f/u

## 2018-01-15 NOTE — Telephone Encounter (Signed)
rxs have been sent.  

## 2018-01-16 ENCOUNTER — Other Ambulatory Visit: Payer: Self-pay | Admitting: Pulmonary Disease

## 2018-01-16 DIAGNOSIS — R05 Cough: Secondary | ICD-10-CM

## 2018-01-16 DIAGNOSIS — R058 Other specified cough: Secondary | ICD-10-CM

## 2018-01-17 ENCOUNTER — Ambulatory Visit: Payer: Medicaid Other | Admitting: Pulmonary Disease

## 2018-01-17 ENCOUNTER — Encounter: Payer: Self-pay | Admitting: Pulmonary Disease

## 2018-01-17 DIAGNOSIS — J4550 Severe persistent asthma, uncomplicated: Secondary | ICD-10-CM

## 2018-01-17 DIAGNOSIS — K219 Gastro-esophageal reflux disease without esophagitis: Secondary | ICD-10-CM | POA: Diagnosis not present

## 2018-01-17 NOTE — Progress Notes (Signed)
Subjective:    Patient ID: Raven Turner, female    DOB: 01-01-1971, 47 y.o.   MRN: [id number]  Synopsis: Former patient of Dr. Melvyn Turner with recurrent wheeze, shortness of breath and cough felt to be due to either vocal cord dysfunction or asthma.  Evaluated by Raven Turner voice Turner in 2017, no paradoxical movements of the vocal cords noted   HPI Chief Complaint  Patient presents with  . Follow-up    ROV   She says she is doing well, she is taking 64m prednisone daily.   She says that when she started taking the prednisone again her and symptoms improved.  She had previously experienced some chest tightness congestion and mucus production but when she started taking prednisone again it was helpful.  She denies any sinus symptoms right now.  She is compliant with her inhaled therapies. She says that she was a little concerned about the finding of some coronary disease but she says she has discussed this with a cardiologist in the past.  She is interested in seeing a new primary care physician.  Past Medical History:  Diagnosis Date  . Anemia   . Asthma   . Bilateral hand numbness 06/25/2015  . Bone pain 02/27/2015   Diffuse bone pain in legs mostly but also in arms    . Breast pain, left 03/27/2015  . Callus of heel 12/16/2014  . Compression fracture   . Compression fracture of L1 lumbar vertebra (HCC) 08/26/2014  . Compression fracture of T12 vertebra ([place] 08/28/2014  . COPD (chronic obstructive pulmonary disease) ([place] 2013  . Cushing syndrome ([place] 2013   . Depression   . Diabetes ([place] 2013   . Diabetes mellitus type 2, uncontrolled ([place] 08/26/2014   Sees Dr. GKaty Turner  Last visit 11/27/14.  No diabetic retinopathy    . Diabetes mellitus with neuropathy ([place]   . Diastolic dysfunction without heart failure 10/14/2014   Grade 2 diastolic dysfunction 2/2 pulm HTN   . Generalized anxiety disorder 03/27/2015  . HTN (hypertension) 2005   . Hx of cataract surgery  11/03/2014  . LUQ abdominal pain 07/17/2015  . Medication management 05/12/2015  . Morbid obesity ([place] 09/08/2014  . 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 Non-English speaking patient 09/08/2014  . OSA (obstructive sleep apnea) 05/20/2015  . Osteopenia 11/19/2013   Dx with osteopenia in lumbar vertebra with partial compression of L1  . Pain due to onychomycosis of toenail 03/27/2015  . Secondary Cushing's syndrome/ iatrogenic   . Skin rash 07/17/2015  . Tachycardia 01/13/2015  . Upper airway cough syndrome 09/05/2014   Followed in Pulmonary clinic/ Raven Turner/ Raven Turner  - Trial off advair      09/05/2014 >>>  - trial off theoph 09/17/2014 and added flutter  - rec reduce dulera to 100 2 bid 07/13/2015     . Vision changes 06/25/2015  . Vitamin D deficiency 08/28/2014        Review of Systems  Constitutional: Negative for chills, fatigue and fever.  HENT: Positive for rhinorrhea. Negative for postnasal drip and sinus pressure.   Respiratory: Positive for cough, shortness of breath and wheezing.   Cardiovascular: Negative for chest pain, palpitations and leg swelling.  Hematological: Bruises/bleeds easily: .dbmfu.       Objective:   Physical Exam Vitals:   01/17/18 1007  BP: (!) 152/90  Pulse: 78  SpO2: 98%  Weight: 203 lb (92.1 kg)  Height: 4' 10" (1.473 m)  RA  Gen: obese but  well appearing HENT: OP clear, TM's clear, neck supple PULM: CTA B, normal percussion CV: RRR, no mgr, trace edema GI: BS+, soft, nontender Derm: no cyanosis or rash Psyche: normal mood and affect    PFT 11/03/14  PFTs p am dulera 100 on 30 mg prednisone  FEV1 1.56(73%) ratio 85 =  No obst, including fef 25-75%, only abn is reduction in ERV spirometry 03/31/15 1.48 (62%) ratio 82  p am dulera and while symptomatic requesting saba  Labs July 2017 IgE 55 (normal), CBC without eosinophils February 2019 CBC with differential showed 400 eosinophils per deciliter  Chest imaging: March 2019 high-resolution  CT scan of the chest images independently reviewed showing some mild air trapping but no interstitial lung disease.      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  Her eosinophil count in February was elevated at 400 cells per deciliter.  We have tested her for vocal cord disease and this was normal.  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  Because of all the complications of prednisone I think it would be best to move her to a biologic agent.  We will make efforts to apply for Raven Turner.  Plan: Severe persistent asthma, allergic, eosinophilic: Continue prednisone 5 mg daily for now We will start the process for Raven Turner, an injectable medicine for asthma Continue Dulera and Singulair Continue albuterol as needed  We will see you back in 6 to 8 weeks or sooner if needed   Current Outpatient Medications:  .  Albiglutide (TANZEUM) 50 MG PEN, Inject 50 mg into the skin once a week., Disp: 4 each, Rfl: 3 .  albuterol (PROVENTIL HFA;VENTOLIN HFA) 108 (90 Base) MCG/ACT inhaler, Inhale 2 puffs into the lungs every 4 (four) hours as needed for wheezing or shortness of breath (((PLAN B)))., Disp: 18 g, Rfl: 6 .  albuterol (PROVENTIL) (2.5 MG/3ML) 0.083% nebulizer solution, USE 1 VIAL VIA NEBULIZER EVERY 4 HOURS AS NEEDED FOR WHEEZING OR SHORTNESS OF BREATH, Disp: 90 mL, Rfl: 0 .  aspirin EC 81 MG tablet, Take 1 tablet (81 mg total) by mouth daily., Disp: 90 tablet, Rfl: 3 .  atorvastatin (LIPITOR) 40 MG tablet, Take 1 tablet (40 mg total) by mouth daily., Disp: 90 tablet, Rfl: 3 .  Blood Glucose Monitoring Suppl (ACCU-CHEK AVIVA PLUS) W/DEVICE KIT, Used as directed, Disp: 1 kit, Rfl:  0 .  buprenorphine (BUTRANS - DOSED MCG/HR) 5 MCG/HR PTWK patch, Place 5 mcg onto the skin once a week., Disp: , Rfl:  .  cabergoline (DOSTINEX) 0.5 MG tablet, Take 0.5 tablets (0.25 mg total) by mouth 2 (two) times a week., Disp: 10 tablet, Rfl: 3 .  cholecalciferol (VITAMIN D) 1000 UNITS tablet, Take 1,000 Units by mouth every morning., Disp: , Rfl:  .  cloNIDine (CATAPRES) 0.2 MG tablet, Take 1 tablet (0.2 mg total) by mouth 3 (three) times daily., Disp: 90 tablet, Rfl: 2 .  clotrimazole-betamethasone (LOTRISONE) cream, Apply to affected area 2 times daily prn, Disp: 45 g, Rfl: 0 .  dextromethorphan-guaiFENesin (MUCINEX DM) 30-600 MG 12hr tablet, Take 1 tablet by mouth 2 (two) times daily as needed (with flutter valve for cough and congestion). Reported on 04/01/2016, Disp: , Rfl:  .  doxycycline (VIBRA-TABS) 100 MG tablet,  Take 1 tablet (100 mg total) by mouth 2 (two) times daily., Disp: 10 tablet, Rfl: 0 .  ferrous sulfate 325 (65 FE) MG tablet, Take 1 tablet (325 mg total) by mouth daily with breakfast., Disp: 30 tablet, Rfl: 3 .  FOLIC ACID PO, Take 1 tablet by mouth every morning., Disp: , Rfl:  .  furosemide (LASIX) 20 MG tablet, Take 1 tablet (20 mg total) by mouth every morning., Disp: 30 tablet, Rfl: 2 .  gabapentin (NEURONTIN) 400 MG capsule, Take 2 capsules (800 mg total) by mouth 3 (three) times daily., Disp: 180 capsule, Rfl: 0 .  glucose blood (RELION GLUCOSE TEST STRIPS) test strip, 1 each by Other route 4 (four) times daily. ICD code E11.65, Disp: 400 each, Rfl: 3 .  HUMULIN R U-500 KWIKPEN 500 UNIT/ML kwikpen, Inject 120-180 Units into the skin 3 (three) times daily. 180 U before breakfast, 160 U before lunch, 140 U before dinner, Disp: 27 mL, Rfl: 11 .  hydrOXYzine (ATARAX/VISTARIL) 25 MG tablet, TAKE 1 TABLET BY MOUTH AT BEDTIME AS NEEDED FOR ANXIETY. START AFTER YOU FINISH CURRENT CLONAZEPAM PRESCRIPTION., Disp: 30 tablet, Rfl: 0 .  Insulin Pen Needle (B-D ULTRAFINE III SHORT  PEN) 31G X 8 MM MISC, 1 application by Does not apply route daily., Disp: 100 each, Rfl: 3 .  Insulin Syringe-Needle U-100 (BD INSULIN SYRINGE ULTRAFINE) 31G X 15/64" 1 ML MISC, 1 each by Does not apply route 3 (three) times daily., Disp: 300 each, Rfl: 3 .  Lancet Devices (ACCU-CHEK SOFTCLIX) lancets, Use as instructed, Disp: 1 each, Rfl: 0 .  levothyroxine (SYNTHROID, LEVOTHROID) 25 MCG tablet, Take 25 mcg by mouth daily., Disp: , Rfl: 3 .  losartan (COZAAR) 100 MG tablet, Take 1 tablet (100 mg total) by mouth every morning., Disp: 30 tablet, Rfl: 0 .  metFORMIN (GLUCOPHAGE) 1000 MG tablet, TAKE 1 TABLET BY MOUTH TWICE DAILY WITH A MEAL, Disp: 60 tablet, Rfl: 2 .  methocarbamol (ROBAXIN) 750 MG tablet, Take 1 tablet (750 mg total) 2 (two) times daily as needed by mouth for muscle spasms. TAKE ONE TABLET BY MOUTH EVERY EIGHT HOURS AS NEEDED FOR MUSCLE SPASMS, Disp: 60 tablet, Rfl: 2 .  Misc. Devices (CANE) MISC, 1 each by Does not apply route daily., Disp: 1 each, Rfl: 0 .  mometasone-formoterol (DULERA) 200-5 MCG/ACT AERO, Inhale 2 puffs into the lungs 2 (two) times daily., Disp: 1 Inhaler, Rfl: 5 .  montelukast (SINGULAIR) 10 MG tablet, Take 1 tablet (10 mg total) by mouth at bedtime., Disp: 90 tablet, Rfl: 0 .  mupirocin ointment (BACTROBAN) 2 %, Place 1 application into the nose 2 (two) times daily., Disp: 22 g, Rfl: 0 .  omeprazole (PRILOSEC) 20 MG capsule, TAKE ONE CAPSULE BY MOUTH EVERY DAY BEFORE BREAKFAST, Disp: 90 capsule, Rfl: 0 .  Oyster Shell Calcium 500 MG TABS, Take 1 tablet (1,250 mg total) by mouth daily., Disp: 90 tablet, Rfl: 3 .  PARoxetine (PAXIL) 20 MG tablet, Take 1 tablet (20 mg total) by mouth daily., Disp: 90 tablet, Rfl: 0 .  predniSONE (DELTASONE) 10 MG tablet, Take 2 tablets daily for 5 days. (Patient taking differently: 5 mg daily with breakfast. ), Disp: 20 tablet, Rfl: 0 .  ranitidine (ZANTAC) 300 MG tablet, TAKE 1 TABLET BY MOUTH EVERY NIGHT AT BEDTIME, Disp: 30  tablet, Rfl: 5 .  ReliOn Ultra Thin Lancets MISC, 1 each by Does not apply route 4 (four) times daily. ICD code E11.65, Disp: 400 each, Rfl:  3 .  Respiratory Therapy Supplies (FLUTTER) DEVI, Use as directed, Disp: 1 each, Rfl: 0 .  Spacer/Aero-Holding Chambers (AEROCHAMBER MV) inhaler, Use as instructed, Disp: 1 each, Rfl: 0 .  spironolactone (ALDACTONE) 25 MG tablet, Take 0.5 tablets (12.5 mg total) by mouth daily., Disp: 45 tablet, Rfl: 3 .  triamcinolone cream (KENALOG) 0.1 %, Apply 1 application 2 (two) times daily topically., Disp: 30 g, Rfl: 0 .  ULTICARE MICRO PEN NEEDLES 32G X 4 MM MISC, USE AS DIRECTED, Disp: 100 each, Rfl: 2 .  verapamil (CALAN-SR) 180 MG CR tablet, Take 2 tablets (360 mg total) by mouth at bedtime., Disp: 180 tablet, Rfl: 0  Current Facility-Administered Medications:  .  betamethasone acetate-betamethasone sodium phosphate (CELESTONE) injection 3 mg, 3 mg, Intramuscular, Once, Evans, Brent M, DPM .  umeclidinium bromide (INCRUSE ELLIPTA) 62.5 MCG/INH 1 puff, 1 puff, Inhalation, Daily, Ladell Pier, MD  Facility-Administered Medications Ordered in Other Visits:  .  DOBUTamine (DOBUTREX) 1,000 mcg/mL in dextrose 5% 250 mL infusion, 30 mcg/kg/min, Intravenous, Continuous, Croitoru, Mihai, MD, Last Rate: 145.3 mL/hr at 04/22/15 1300, 30 mcg/kg/min at 04/22/15 1300

## 2018-01-17 NOTE — Patient Instructions (Signed)
Severe persistent asthma, allergic, eosinophilic: Continue prednisone 5 mg daily for now We will start the process for Dupixent, an injectable medicine for asthma Continue Dulera and Singulair Continue albuterol as needed  I recommend you contact the [facility] office: [phone]. You may want to see Dr. Sharlet Salina or Quay Burow  We will see you back in 6 to 8 weeks or sooner if needed

## 2018-01-24 ENCOUNTER — Telehealth: Payer: Self-pay | Admitting: Pulmonary Disease

## 2018-01-24 NOTE — Telephone Encounter (Signed)
Returned call, got a busy signal. Will try again later.

## 2018-01-25 ENCOUNTER — Other Ambulatory Visit: Payer: Self-pay

## 2018-01-25 DIAGNOSIS — I10 Essential (primary) hypertension: Secondary | ICD-10-CM

## 2018-01-25 MED ORDER — CLONIDINE HCL 0.2 MG PO TABS: 0.2000 mg | ORAL_TABLET | Freq: Three times a day (TID) | ORAL | 1 refills | Status: DC

## 2018-01-26 ENCOUNTER — Telehealth: Payer: Self-pay | Admitting: Pulmonary Disease

## 2018-01-26 MED ORDER — PREDNISONE 5 MG PO TABS: 5.0000 mg | ORAL_TABLET | Freq: Every day | ORAL | 0 refills | Status: DC

## 2018-01-26 NOTE — Telephone Encounter (Signed)
Called and spoke with patient, she is requesting a refill on the prednisone. Per patients last OV note BQ states that patient needs to continue on prednisone 5 mg daily.   Refill sent to pharmacy. Nothing further needed.

## 2018-01-26 NOTE — Telephone Encounter (Signed)
Pt is calling back [phone]

## 2018-01-30 ENCOUNTER — Ambulatory Visit: Payer: Medicaid Other | Attending: Internal Medicine | Admitting: Internal Medicine

## 2018-01-30 ENCOUNTER — Encounter: Payer: Self-pay | Admitting: Internal Medicine

## 2018-01-30 VITALS — BP 125/76 | HR 70 | Temp 98.4°F | Resp 16 | Wt 207.2 lb

## 2018-01-30 DIAGNOSIS — R600 Localized edema: Secondary | ICD-10-CM | POA: Diagnosis not present

## 2018-01-30 DIAGNOSIS — Z7982 Long term (current) use of aspirin: Secondary | ICD-10-CM | POA: Insufficient documentation

## 2018-01-30 DIAGNOSIS — Z87442 Personal history of urinary calculi: Secondary | ICD-10-CM | POA: Diagnosis not present

## 2018-01-30 DIAGNOSIS — T380X5A Adverse effect of glucocorticoids and synthetic analogues, initial encounter: Secondary | ICD-10-CM | POA: Diagnosis not present

## 2018-01-30 DIAGNOSIS — Z79890 Hormone replacement therapy (postmenopausal): Secondary | ICD-10-CM | POA: Insufficient documentation

## 2018-01-30 DIAGNOSIS — E118 Type 2 diabetes mellitus with unspecified complications: Secondary | ICD-10-CM | POA: Diagnosis not present

## 2018-01-30 DIAGNOSIS — M8000XS Age-related osteoporosis with current pathological fracture, unspecified site, sequela: Secondary | ICD-10-CM | POA: Insufficient documentation

## 2018-01-30 DIAGNOSIS — Z794 Long term (current) use of insulin: Secondary | ICD-10-CM

## 2018-01-30 DIAGNOSIS — F411 Generalized anxiety disorder: Secondary | ICD-10-CM | POA: Diagnosis not present

## 2018-01-30 DIAGNOSIS — Z6841 Body Mass Index (BMI) 40.0 and over, adult: Secondary | ICD-10-CM | POA: Diagnosis not present

## 2018-01-30 DIAGNOSIS — Z8249 Family history of ischemic heart disease and other diseases of the circulatory system: Secondary | ICD-10-CM | POA: Diagnosis not present

## 2018-01-30 DIAGNOSIS — R300 Dysuria: Secondary | ICD-10-CM | POA: Diagnosis not present

## 2018-01-30 DIAGNOSIS — R809 Proteinuria, unspecified: Secondary | ICD-10-CM

## 2018-01-30 DIAGNOSIS — E242 Drug-induced Cushing's syndrome: Secondary | ICD-10-CM | POA: Insufficient documentation

## 2018-01-30 DIAGNOSIS — Z79899 Other long term (current) drug therapy: Secondary | ICD-10-CM | POA: Diagnosis not present

## 2018-01-30 DIAGNOSIS — K7581 Nonalcoholic steatohepatitis (NASH): Secondary | ICD-10-CM | POA: Diagnosis not present

## 2018-01-30 DIAGNOSIS — I10 Essential (primary) hypertension: Secondary | ICD-10-CM | POA: Diagnosis not present

## 2018-01-30 DIAGNOSIS — E1129 Type 2 diabetes mellitus with other diabetic kidney complication: Secondary | ICD-10-CM

## 2018-01-30 DIAGNOSIS — D352 Benign neoplasm of pituitary gland: Secondary | ICD-10-CM | POA: Insufficient documentation

## 2018-01-30 DIAGNOSIS — G4733 Obstructive sleep apnea (adult) (pediatric): Secondary | ICD-10-CM | POA: Diagnosis not present

## 2018-01-30 DIAGNOSIS — IMO0002 Reserved for concepts with insufficient information to code with codable children: Secondary | ICD-10-CM

## 2018-01-30 DIAGNOSIS — J45909 Unspecified asthma, uncomplicated: Secondary | ICD-10-CM | POA: Diagnosis not present

## 2018-01-30 DIAGNOSIS — E1165 Type 2 diabetes mellitus with hyperglycemia: Secondary | ICD-10-CM | POA: Diagnosis not present

## 2018-01-30 LAB — POCT URINALYSIS DIPSTICK
Bilirubin, UA: NEGATIVE
Glucose, UA: NEGATIVE
Ketones, UA: NEGATIVE
Leukocytes, UA: NEGATIVE
Nitrite, UA: NEGATIVE
Protein, UA: POSITIVE — AB
Spec Grav, UA: 1.025 (ref 1.010–1.025)
Urobilinogen, UA: 0.2 E.U./dL
pH, UA: 5.5 (ref 5.0–8.0)

## 2018-01-30 LAB — POCT GLYCOSYLATED HEMOGLOBIN (HGB A1C): HbA1c, POC (controlled diabetic range): 7.9 % — AB (ref 0.0–7.0)

## 2018-01-30 LAB — GLUCOSE, POCT (MANUAL RESULT ENTRY): POC Glucose: 206 mg/dl — AB (ref 70–99)

## 2018-01-30 NOTE — Patient Instructions (Signed)
Diabetes mellitus y nutrición  Diabetes Mellitus and Nutrition  Si sufre de diabetes (diabetes mellitus), es muy importante tener hábitos alimenticios saludables debido a que sus niveles de azúcar en la sangre (glucosa) se ven afectados en gran medida por lo que come y bebe. Comer alimentos saludables en las cantidades adecuadas, aproximadamente a la misma hora todos los días, lo ayudará a:  · Controlar la glucemia.  · Disminuir el riesgo de sufrir una enfermedad cardíaca.  · Mejorar la presión arterial.  · Alcanzar o mantener un peso saludable.    Todas las personas que sufren de diabetes son diferentes y cada una tiene necesidades diferentes en cuanto a un plan de alimentación. El médico puede recomendarle que trabaje con un especialista en dietas y nutrición (nutricionista) para elaborar el mejor plan para usted. Su plan de alimentación puede variar según factores como:  · Las calorías que necesita.  · Los medicamentos que toma.  · Su peso.  · Sus niveles de glucemia, presión arterial y colesterol.  · Su nivel de actividad.  · Otras afecciones que tenga, como enfermedades cardíacas o renales.    ¿Cómo me afectan los carbohidratos?  Los carbohidratos afectan el nivel de glucemia más que cualquier otro tipo de alimento. La ingesta de carbohidratos naturalmente aumenta la cantidad glucosa en la sangre. El recuento de carbohidratos es un método destinado a llevar un registro de la cantidad de carbohidratos que se ingieren. El recuento de carbohidratos es importante para mantener la glucemia a un nivel saludable, en especial si utiliza insulina o toma determinados medicamentos por vía oral para la diabetes.  Es importante saber la cantidad de carbohidratos que se pueden ingerir en cada comida sin correr ningún riesgo. Esto es diferente en cada persona. El nutricionista puede ayudarlo a calcular la cantidad de carbohidratos que debe ingerir en cada comida y colación.   Los alimentos que contienen carbohidratos incluyen:  · Pan, cereal, arroz, pasta y galletas.  · Papas y maíz.  · Guisantes, frijoles y lentejas.  · Leche y yogur.  · Frutas y jugo.  · Postres, como pasteles, galletitas, helado y caramelos.    ¿Cómo me afecta el alcohol?  El alcohol puede provocar disminuciones súbitas de la glucemia (hipoglucemia), en especial si utiliza insulina o toma determinados medicamentos por vía oral para la diabetes. La hipoglucemia es una afección potencialmente mortal. Los síntomas de la hipoglucemia (somnolencia, mareos y confusión) son similares a los síntomas de haber consumido demasiado alcohol.  Si el médico afirma que el alcohol es seguro para usted, siga estas pautas:  · Limite el consumo de alcohol a no más de 1 medida por día si es mujer y no está embarazada, y a 2 medidas si es hombre. Una medida equivale a 12 oz (355 ml) de cerveza, 5 oz (148 ml) de vino o 1½ oz (44 ml) de bebidas de alta graduación alcohólica.  · No beba con el estómago vacío.  · Manténgase hidratado con agua, gaseosas dietéticas o té helado sin azúcar.  · Tenga en cuenta que las gaseosas comunes, los jugos y otros refrescos pueden contener mucha azúcar y se deben contar como carbohidratos.    Consejos para seguir este plan  Leer las etiquetas de los alimentos  · Comience por controlar el tamaño de la porción en la etiqueta. La cantidad de calorías, carbohidratos, grasas y otros nutrientes mencionados en la etiqueta se basan en una porción del alimento. Muchos alimentos contienen más de una porción por envase.  · Verifique la cantidad total de gramos (g)   de carbohidratos totales en una porción. Puede calcular la cantidad de porciones de carbohidratos al dividir el total de carbohidratos por 15. Por ejemplo, si un alimento posee un total de 30 g de carbohidratos, equivale a 2 porciones de carbohidratos.  · Verifique la cantidad de gramos (g) de grasas saturadas y grasas trans  en una porción. Escoja alimentos que no contengan grasa o que tengan un bajo contenido.  · Controle la cantidad de miligramos (mg) de sodio en una porción. La mayoría de las personas deben limitar la ingesta de sodio total a menos de 2300 mg por día.  · Siempre consulte la información nutricional de los alimentos etiquetados como “con bajo contenido de grasa” o “sin grasa”. Estos alimentos pueden ser más altos en azúcar agregada o en carbohidratos refinados y deben evitarse.  · Hable con el nutricionista para identificar sus objetivos diarios en cuanto a los nutrientes mencionados en la etiqueta.  De compras  · Evite comprar alimentos procesados, enlatados o prehechos. Estos alimentos tienden a tener mayor cantidad de grasa, sodio y azúcar agregada.  · Compre en la zona exterior de la tienda de comestibles. Esta incluye frutas y vegetales frescos, granos a granel, carnes frescas y productos lácteos frescos.  Cocción  · Utilice métodos de cocción a baja temperatura, como hornear, en lugar de métodos de cocción a alta temperatura, como freír en abundante aceite.  · Cocine con aceites saludables, como el aceite de oliva, canola o girasol.  · Evite cocinar con manteca, crema o carnes con alto contenido de grasa.  Planificación de las comidas  · Consuma las comidas y las colaciones de forma regular, preferentemente a la misma hora todos los días. Evite pasar largos períodos de tiempo sin comer.  · Consuma alimentos ricos en fibra, como frutas frescas, verduras, frijoles y cereales integrales. Consulte al nutricionista sobre cuántas porciones de carbohidratos puede consumir en cada comida.  · Consuma entre 4 y 6 onzas de proteínas magras por día, como carnes magras, pollo, pescado, huevos o tofu. 1 onza equivale a 1 onza de carne, pollo o pescado, 1 huevo, o 1/4 taza de tofu.  · Coma algunos alimentos por día que contengan grasas saludables, como aguacates, frutos secos, semillas y pescado.  Estilo de vida     · Controle su nivel de glucemia con regularidad.  · Haga ejercicio al menos 30 minutos, 5 días o más por semana, o como se lo haya indicado el médico.  · Tome los medicamentos como se lo haya indicado el médico.  · No consuma ningún producto que contenga nicotina o tabaco, como cigarrillos y cigarrillos electrónicos. Si necesita ayuda para dejar de fumar, consulte al médico.  · Trabaje con un asesor o instructor en diabetes para identificar estrategias para controlar el estrés y cualquier desafío emocional y social.  ¿Cuáles son algunas de las preguntas que puedo hacerle a mi médico?  · ¿Es necesario que me reúna con un instructor en diabetes?  · ¿Es necesario que me reúna con un nutricionista?  · ¿A qué número puedo llamar si tengo preguntas?  · ¿Cuáles son los mejores momentos para controlar la glucemia?  Dónde encontrar más información:  · Asociación Americana de la Diabetes (American Diabetes Association): diabetes.org/food-and-fitness/food  · Academia de Nutrición y Dietética (Academy of Nutrition and Dietetics): www.eatright.org/resources/health/diseases-and-conditions/diabetes  · Instituto Nacional de la Diabetes y las Enfermedades Digestivas y Renales (National Institute of Diabetes and Digestive and Kidney Diseases) (Institutos Nacionales de Salud, NIH): www.niddk.nih.gov/health-information/diabetes/overview/diet-eating-physical-activity  Resumen  · Un plan de alimentación saludable   lo ayudará a controlar la glucemia y mantener un estilo de vida saludable.  · Trabajar con un especialista en dietas y nutrición (nutricionista) puede ayudarlo a elaborar el mejor plan de alimentación para usted.  · Tenga en cuenta que los carbohidratos y el alcohol tienen efectos inmediatos en sus niveles de glucemia. Es importante contar los carbohidratos y consumir alcohol con prudencia.  Esta información no tiene como fin reemplazar el consejo del médico. Asegúrese de hacerle al médico cualquier pregunta que tenga.   Document Released: 12/06/2007 Document Revised: 12/19/2016 Document Reviewed: 12/19/2016  Elsevier Interactive Patient Education © 2018 Elsevier Inc.

## 2018-01-30 NOTE — Progress Notes (Signed)
Patient ID: Raven Turner, female    DOB: 1970-10-14  MRN: [id number]  CC: Diabetes and Hypertension   Subjective: Raven Turner is a 47 y.o. female who presents for chronic ds management.  Spouse is with her. 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 Waunita Schooner recently.  Plan to start her on Dupixent.  Awaiting approval from her insurance.  She continues her current inhalers and low dose oral steroid.  Feels breathing is stable  HTN:  Has a wrist device.  Checks BP 2-3 x a wk.  Gives range of 120-150/80. -limits salt intake -reports compliance with meds including Cozaar, Calan, Spironolactone.  DM:  Had urine microalbumine through endocrine.  Level was 95. She has copy of lab that she wanted me to see.  Pt is on ARB Saw Dr. Buddy Duty 12/04/2017.  A1C was 7.5 per her report -checks BS 4 x a day.  A.m range 160-180; before dinner 230-250 Takes Humulin R 180 in a.m/140 units with lunch /120 units with dinner.  Off Tanzeum.  Has f/u with endocrine in June. -gained 10 lbs in past 2 mths.  Trying to control eating habits  C/o pain increase pain in lower back with urination x 2 wks.  Alternates sides.  No dysuria.  No blood in urine or fever. Small BL renal calculi on CT 2017  Saw cardiologist recently.  Dr. Harrington Challenger refers to a chest or cardiac CT showing coronary calcifications.  Lipitor dose increased.    Osteoporosis:  Last Reclast infusion was 03/2017.  Had a bone scan 2017.  Patient Active Problem List   Diagnosis Date Noted  . Pituitary microadenoma ([place]) 03/13/2017  . Elevated prolactin level ([place]) 01/19/2017  . Microalbuminuria due to type 2 diabetes mellitus ([place]) 12/19/2016  . Elevated TSH 12/19/2016  . Menstrual  changes 12/19/2016  . Flaking of scalp 07/18/2016  . Acral type peeling skin syndrome 07/18/2016  . Non-traumatic compression fracture of vertebral column ([place]) 01/20/2016  . Recurrent abdominal pain 01/11/2016  . Bilateral carpal tunnel syndrome 12/21/2015  . Chronic back pain 11/05/2015  . NASH (nonalcoholic steatohepatitis) 09/25/2015  . Pruritic erythematous rash 07/17/2015  . Bilateral hand numbness 06/25/2015  . Vision changes 06/25/2015  . OSA (obstructive sleep apnea) 05/20/2015  . Generalized anxiety disorder 03/27/2015  . Pain due to onychomycosis of toenail 03/27/2015  . Breast pain, left 03/27/2015  . Bone pain 02/27/2015  . Callus of heel 12/16/2014  . 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 Hx of cataract surgery 11/03/2014  . Diastolic dysfunction without heart failure 10/14/2014  . Morbid obesity ([place]) 09/08/2014  . Non-English speaking patient 09/08/2014  . Upper airway cough syndrome 09/05/2014  . Asthma ? severity vs VCD/ pseudo asthma 09/05/2014  . Vitamin D deficiency 08/28/2014  . Compression fracture of T12 vertebra ([place]) 08/28/2014  . Diabetes mellitus type 2, uncontrolled ([place]) 08/26/2014  . Compression fracture of L1 lumbar vertebra ([facility]) 08/26/2014  . Essential (primary) hypertension   . Secondary Cushing's syndrome/ iatrogenic   . 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     Current Outpatient Medications on File Prior to Visit  Medication Sig Dispense Refill  . albuterol (PROVENTIL HFA;VENTOLIN HFA) 108 (90 Base) MCG/ACT inhaler Inhale 2 puffs into the lungs every 4 (four) hours as needed for wheezing or shortness of breath (((PLAN B))). 18 g  6  . albuterol (PROVENTIL) (2.5 MG/3ML) 0.083% nebulizer solution USE 1 VIAL VIA NEBULIZER EVERY 4 HOURS AS NEEDED FOR WHEEZING OR SHORTNESS OF BREATH 90 mL 0  . aspirin EC 81 MG tablet Take 1 tablet (81 mg total) by mouth daily. 90 tablet 3  . atorvastatin (LIPITOR) 40 MG tablet Take 1 tablet (40 mg total) by mouth  daily. 90 tablet 3  . Blood Glucose Monitoring Suppl (ACCU-CHEK AVIVA PLUS) W/DEVICE KIT Used as directed 1 kit 0  . buprenorphine (BUTRANS - DOSED MCG/HR) 5 MCG/HR PTWK patch Place 5 mcg onto the skin once a week.    . cabergoline (DOSTINEX) 0.5 MG tablet Take 0.5 tablets (0.25 mg total) by mouth 2 (two) times a week. 10 tablet 3  . cholecalciferol (VITAMIN D) 1000 UNITS tablet Take 1,000 Units by mouth every morning.    . cloNIDine (CATAPRES) 0.2 MG tablet Take 1 tablet (0.2 mg total) by mouth 3 (three) times daily. 90 tablet 1  . clotrimazole-betamethasone (LOTRISONE) cream Apply to affected area 2 times daily prn 45 g 0  . dextromethorphan-guaiFENesin (MUCINEX DM) 30-600 MG 12hr tablet Take 1 tablet by mouth 2 (two) times daily as needed (with flutter valve for cough and congestion). Reported on 04/01/2016    . doxycycline (VIBRA-TABS) 100 MG tablet Take 1 tablet (100 mg total) by mouth 2 (two) times daily. 10 tablet 0  . ferrous sulfate 325 (65 FE) MG tablet Take 1 tablet (325 mg total) by mouth daily with breakfast. 30 tablet 3  . FOLIC ACID PO Take 1 tablet by mouth every morning.    . furosemide (LASIX) 20 MG tablet Take 1 tablet (20 mg total) by mouth every morning. 30 tablet 2  . gabapentin (NEURONTIN) 400 MG capsule Take 2 capsules (800 mg total) by mouth 3 (three) times daily. 180 capsule 0  . glucose blood (RELION GLUCOSE TEST STRIPS) test strip 1 each by Other route 4 (four) times daily. ICD code E11.65 400 each 3  . HUMULIN R U-500 KWIKPEN 500 UNIT/ML kwikpen Inject 120-180 Units into the skin 3 (three) times daily. 180 U before breakfast, 160 U before lunch, 140 U before dinner 27 mL 11  . hydrOXYzine (ATARAX/VISTARIL) 25 MG tablet TAKE 1 TABLET BY MOUTH AT BEDTIME AS NEEDED FOR ANXIETY. START AFTER YOU FINISH CURRENT CLONAZEPAM PRESCRIPTION. 30 tablet 0  . Insulin Pen Needle (B-D ULTRAFINE III SHORT PEN) 31G X 8 MM MISC 1 application by Does not apply route daily. 100 each 3  .  Insulin Syringe-Needle U-100 (BD INSULIN SYRINGE ULTRAFINE) 31G X 15/64" 1 ML MISC 1 each by Does not apply route 3 (three) times daily. 300 each 3  . Lancet Devices (ACCU-CHEK SOFTCLIX) lancets Use as instructed 1 each 0  . levothyroxine (SYNTHROID, LEVOTHROID) 25 MCG tablet Take 25 mcg by mouth daily.  3  . losartan (COZAAR) 100 MG tablet Take 1 tablet (100 mg total) by mouth every morning. 30 tablet 0  . metFORMIN (GLUCOPHAGE) 1000 MG tablet TAKE 1 TABLET BY MOUTH TWICE DAILY WITH A MEAL 60 tablet 2  . methocarbamol (ROBAXIN) 750 MG tablet Take 1 tablet (750 mg total) 2 (two) times daily as needed by mouth for muscle spasms. TAKE ONE TABLET BY MOUTH EVERY EIGHT HOURS AS NEEDED FOR MUSCLE SPASMS 60 tablet 2  . Misc. Devices (CANE) MISC 1 each by Does not apply route daily. 1 each 0  . mometasone-formoterol (DULERA) 200-5 MCG/ACT AERO Inhale 2 puffs into the lungs  2 (two) times daily. 1 Inhaler 5  . montelukast (SINGULAIR) 10 MG tablet Take 1 tablet (10 mg total) by mouth at bedtime. 90 tablet 0  . mupirocin ointment (BACTROBAN) 2 % Place 1 application into the nose 2 (two) times daily. 22 g 0  . omeprazole (PRILOSEC) 20 MG capsule TAKE ONE CAPSULE BY MOUTH EVERY DAY BEFORE BREAKFAST 90 capsule 0  . Oyster Shell Calcium 500 MG TABS Take 1 tablet (1,250 mg total) by mouth daily. 90 tablet 3  . PARoxetine (PAXIL) 20 MG tablet Take 1 tablet (20 mg total) by mouth daily. 90 tablet 0  . predniSONE (DELTASONE) 5 MG tablet Take 1 tablet (5 mg total) by mouth daily with breakfast. 30 tablet 0  . ranitidine (ZANTAC) 300 MG tablet TAKE 1 TABLET BY MOUTH EVERY NIGHT AT BEDTIME 30 tablet 5  . ReliOn Ultra Thin Lancets MISC 1 each by Does not apply route 4 (four) times daily. ICD code E11.65 400 each 3  . Respiratory Therapy Supplies (FLUTTER) DEVI Use as directed 1 each 0  . Spacer/Aero-Holding Chambers (AEROCHAMBER MV) inhaler Use as instructed 1 each 0  . spironolactone (ALDACTONE) 25 MG tablet Take 0.5  tablets (12.5 mg total) by mouth daily. 45 tablet 3  . triamcinolone cream (KENALOG) 0.1 % Apply 1 application 2 (two) times daily topically. 30 g 0  . ULTICARE MICRO PEN NEEDLES 32G X 4 MM MISC USE AS DIRECTED 100 each 2  . verapamil (CALAN-SR) 180 MG CR tablet Take 2 tablets (360 mg total) by mouth at bedtime. 180 tablet 0   Current Facility-Administered Medications on File Prior to Visit  Medication Dose Route Frequency Provider Last Rate Last Dose  . betamethasone acetate-betamethasone sodium phosphate (CELESTONE) injection 3 mg  3 mg Intramuscular Once Daylene Katayama M, DPM      . DOBUTamine (DOBUTREX) 1,000 mcg/mL in dextrose 5% 250 mL infusion  30 mcg/kg/min Intravenous Continuous Croitoru, Mihai, MD 145.3 mL/hr at 04/22/15 1300 30 mcg/kg/min at 04/22/15 1300  . umeclidinium bromide (INCRUSE ELLIPTA) 62.5 MCG/INH 1 puff  1 puff Inhalation Daily Ladell Pier, MD        Allergies  Allergen Reactions  . Shellfish Allergy Anaphylaxis  . Septra [Sulfamethoxazole-Trimethoprim] Rash, Other (See Comments) and Itching    Facial redness with pruritus  Facial redness with pruritus   . Hydrocortisone Rash    Social History   Socioeconomic History  . Marital status: Married    Spouse name: Not on file  . Number of children: Not on file  . Years of education: Not on file  . Highest education level: Not on file  Occupational History  . Not on file  Social Needs  . Financial resource strain: Not on file  . Food insecurity:    Worry: Not on file    Inability: Not on file  . Transportation needs:    Medical: Not on file    Non-medical: Not on file  Tobacco Use  . Smoking status: Never Smoker  . Smokeless tobacco: Never Used  Substance and Sexual Activity  . Alcohol use: No    Alcohol/week: 0.0 oz  . Drug use: No  . Sexual activity: Yes    Birth control/protection: Surgical  Lifestyle  . Physical activity:    Days per week: Not on file    Minutes per session: Not on file    . Stress: Not on file  Relationships  . Social connections:    Talks on phone: Not on  file    Gets together: Not on file    Attends religious service: Not on file    Active member of club or organization: Not on file    Attends meetings of clubs or organizations: Not on file    Relationship status: Not on file  . Intimate partner violence:    Fear of current or ex partner: Not on file    Emotionally abused: Not on file    Physically abused: Not on file    Forced sexual activity: Not on file  Other Topics Concern  . Not on file  Social History Narrative   Lives at home with husband.  No children   She does not work   Highest level of education:  12th grade    Family History  Problem Relation Age of Onset  . Diabetes Mother   . Stroke Father   . Asthma Father   . Hypertension Sister   . Hypertension Brother     Past Surgical History:  Procedure Laterality Date  . CATARACT EXTRACTION Bilateral 2014  . HAND SURGERY Right 12/18/2015  . VAGINAL HYSTERECTOMY      ROS: Review of Systems Neg except as above  PHYSICAL EXAM: BP 125/76   Pulse 70   Temp 98.4 F (36.9 C) (Oral)   Resp 16   Wt 207 lb 3.2 oz (94 kg)   SpO2 98%   BMI 43.30 kg/m   Wt Readings from Last 3 Encounters:  01/30/18 207 lb 3.2 oz (94 kg)  01/17/18 203 lb (92.1 kg)  12/04/17 197 lb 3.2 oz (89.4 kg)    Physical Exam  General appearance - alert, well appearing, and in no distress Mental status - normal mood, behavior, speech, dress, motor activity, and thought processes Neck - supple, no significant adenopathy Chest - clear to auscultation, no wheezes, rales or rhonchi, symmetric air entry Heart - normal rate, regular rhythm, normal S1, S2, + 2/6 SEM LT upper sternal boarder Abdomen - nl bowel sounds, soft, NT Musculoskeletal - no tenderness on palpation of flank BL Extremities -no  LE edema Results for orders placed or performed in visit on 01/30/18  POCT glucose (manual entry)  Result  Value Ref Range   POC Glucose 206 (A) 70 - 99 mg/dl  POCT glycosylated hemoglobin (Hb A1C)  Result Value Ref Range   Hemoglobin A1C  4.0 - 5.6 %   HbA1c, POC (prediabetic range)  5.7 - 6.4 %   HbA1c, POC (controlled diabetic range) 7.9 (A) 0.0 - 7.0 %  POCT urinalysis dipstick  Result Value Ref Range   Color, UA yellow    Clarity, UA clear    Glucose, UA Negative Negative   Bilirubin, UA negative    Ketones, UA negative    Spec Grav, UA 1.025 1.010 - 1.025   Blood, UA moderate    pH, UA 5.5 5.0 - 8.0   Protein, UA Positive (A) Negative   Urobilinogen, UA 0.2 0.2 or 1.0 E.U./dL   Nitrite, UA negative    Leukocytes, UA Negative Negative   Appearance     Odor      ASSESSMENT AND PLAN: 1. Dysuria UA dip neg for UTI.  + for blood.  Pt reports menses ended this past wkend.  Could be residual blood from that vs stone.  Pt given precautions for f/u if pain increases or becomes constant localizing to one side, fever  or hematuria - POCT urinalysis dipstick  2. Uncontrolled type 2 diabetes mellitus with  complication, with long-term current use of insulin ([facility]) -dietary counseling given.  Keep appt with Dr. Buddy Duty next mth  3. Microalbuminuria due to type 2 diabetes mellitus ([facility]) Pt on Cozaar - POCT glucose (manual entry) - POCT glycosylated hemoglobin (Hb A1C)  4. Essential hypertension At goal.  Continue Spironolactone and Cozaar  5. Class 3 severe obesity due to excess calories with serious comorbidity and body mass index (BMI) of 40.0 to 44.9 in adult [facility]) See #1 above  6. Osteoporosis with current pathological fracture, unspecified osteoporosis type, sequela -will get bone density.  Pt advised to ask Dr. Buddy Duty about ordering Reclast for July. - DG Bone Density; Future   Patient was given the opportunity to ask questions.  Patient verbalized understanding of the plan and was able to repeat key elements of the plan.  [employer] interpreter used during this encounter.  Orders  Placed This Encounter  Procedures  . DG Bone Density  . POCT glucose (manual entry)  . POCT glycosylated hemoglobin (Hb A1C)  . POCT urinalysis dipstick      Requested Prescriptions    No prescriptions requested or ordered in this encounter    Return in about 3 months (around 05/02/2018).  Karle Plumber, MD, FACP

## 2018-02-01 ENCOUNTER — Ambulatory Visit (INDEPENDENT_AMBULATORY_CARE_PROVIDER_SITE_OTHER): Payer: Medicaid Other | Admitting: Pulmonary Disease

## 2018-02-01 DIAGNOSIS — R058 Other specified cough: Secondary | ICD-10-CM

## 2018-02-01 DIAGNOSIS — R05 Cough: Secondary | ICD-10-CM | POA: Diagnosis not present

## 2018-02-01 LAB — PULMONARY FUNCTION TEST
DL/VA % pred: 169 %
DL/VA: 5.55 ml/min/mmHg/L
DLCO unc % pred: 131 %
DLCO unc: 15.96 ml/min/mmHg
FEF 25-75 Post: 2.22 L/sec
FEF 25-75 Pre: 1.76 L/sec
FEF2575-%Change-Post: 25 %
FEF2575-%Pred-Post: 90 %
FEF2575-%Pred-Pre: 72 %
FEV1-%Change-Post: 5 %
FEV1-%Pred-Post: 81 %
FEV1-%Pred-Pre: 78 %
FEV1-Post: 1.73 L
FEV1-Pre: 1.65 L
FEV1FVC-%Change-Post: 6 %
FEV1FVC-%Pred-Pre: 100 %
FEV6-%Change-Post: -1 %
FEV6-%Pred-Post: 78 %
FEV6-%Pred-Pre: 79 %
FEV6-Post: 1.99 L
FEV6-Pre: 2.03 L
FEV6FVC-%Pred-Post: 102 %
FEV6FVC-%Pred-Pre: 102 %
FVC-%Change-Post: -1 %
FVC-%Pred-Post: 76 %
FVC-%Pred-Pre: 78 %
FVC-Post: 1.99 L
FVC-Pre: 2.03 L
Post FEV1/FVC ratio: 87 %
Post FEV6/FVC ratio: 100 %
Pre FEV1/FVC ratio: 81 %
Pre FEV6/FVC Ratio: 100 %
RV % pred: 139 %
RV: 1.74 L
TLC % pred: 103 %
TLC: 3.83 L

## 2018-02-01 NOTE — Progress Notes (Signed)
PFT done today. 

## 2018-02-02 ENCOUNTER — Telehealth: Payer: Self-pay | Admitting: Internal Medicine

## 2018-02-02 ENCOUNTER — Other Ambulatory Visit: Payer: Self-pay

## 2018-02-02 DIAGNOSIS — I10 Essential (primary) hypertension: Secondary | ICD-10-CM

## 2018-02-02 DIAGNOSIS — R7989 Other specified abnormal findings of blood chemistry: Secondary | ICD-10-CM

## 2018-02-02 DIAGNOSIS — E229 Hyperfunction of pituitary gland, unspecified: Principal | ICD-10-CM

## 2018-02-02 MED ORDER — LOSARTAN POTASSIUM 100 MG PO TABS: 100.0000 mg | ORAL_TABLET | Freq: Every morning | ORAL | 6 refills | Status: AC

## 2018-02-02 MED ORDER — CABERGOLINE 0.5 MG PO TABS: 0.2500 mg | ORAL_TABLET | ORAL | 3 refills | Status: AC

## 2018-02-02 MED ORDER — VERAPAMIL HCL ER 180 MG PO TBCR: 360.0000 mg | EXTENDED_RELEASE_TABLET | Freq: Every day | ORAL | 2 refills | Status: AC

## 2018-02-02 MED ORDER — LOSARTAN POTASSIUM 100 MG PO TABS: 100.0000 mg | ORAL_TABLET | Freq: Every morning | ORAL | 2 refills | Status: DC

## 2018-02-02 NOTE — Telephone Encounter (Signed)
Requested Rfs sent.

## 2018-02-02 NOTE — Telephone Encounter (Signed)
Tammy please advise if this matter was handled. Thanks.

## 2018-02-02 NOTE — Telephone Encounter (Signed)
Pt called to request a medication refill  -verapamil (CALAN-SR) 180 MG CR tablet  -losartan (COZAAR) 100 MG tablet  -cabergoline (DOSTINEX) 0.5 MG tablet  To  -[facility] -[address], [place], [place] [address] Please follow up

## 2018-02-06 ENCOUNTER — Telehealth: Payer: Self-pay | Admitting: Pulmonary Disease

## 2018-02-06 NOTE — Telephone Encounter (Signed)
Spoke with [facility]-PA was approved and they have reached out to patient for cost and approval to ship to our office.   I spoke with patient and her husband -gave phone number to Jessamine to call back today to get everything taken care of.    [facility] will contact our office to schedule shipment. Nothing more needed in this phone note.

## 2018-02-06 NOTE — Telephone Encounter (Signed)
lmtcb for pt.  

## 2018-02-07 NOTE — Telephone Encounter (Signed)
Error

## 2018-02-08 ENCOUNTER — Telehealth: Payer: Self-pay | Admitting: Pulmonary Disease

## 2018-02-08 MED ORDER — EPINEPHRINE 0.3 MG/0.3ML IJ SOAJ: 0.3000 mg | Freq: Once | INTRAMUSCULAR | 11 refills | Status: AC

## 2018-02-08 NOTE — Telephone Encounter (Signed)
Routing to TS about dupixent

## 2018-02-08 NOTE — Telephone Encounter (Signed)
[facility] called pt to set up del. For 02/13/18. Pt. Called to make an appt. (02/19/18). I sent Epi-pen rx to [facility] on [address] Pt. Is aware of 2 hr wait. I called [facility] to confirm del. And our address. Will put order in dupixent smart text. Nothing further needed.

## 2018-02-08 NOTE — Telephone Encounter (Signed)
Rx sent 

## 2018-02-09 ENCOUNTER — Other Ambulatory Visit: Payer: Self-pay

## 2018-02-09 ENCOUNTER — Ambulatory Visit: Payer: Medicaid Other | Admitting: Internal Medicine

## 2018-02-09 ENCOUNTER — Telehealth: Payer: Self-pay | Admitting: Pulmonary Disease

## 2018-02-09 ENCOUNTER — Ambulatory Visit (INDEPENDENT_AMBULATORY_CARE_PROVIDER_SITE_OTHER): Payer: Medicaid Other

## 2018-02-09 DIAGNOSIS — Z79899 Other long term (current) drug therapy: Secondary | ICD-10-CM

## 2018-02-09 LAB — BASIC METABOLIC PANEL
BUN/Creatinine Ratio: 15 (ref 9–23)
BUN: 17 mg/dL (ref 6–24)
CO2: 25 mmol/L (ref 20–29)
Calcium: 9.4 mg/dL (ref 8.7–10.2)
Chloride: 98 mmol/L (ref 96–106)
Creatinine, Ser: 1.12 mg/dL — ABNORMAL HIGH (ref 0.57–1.00)
GFR calc Af Amer: 69 mL/min/{1.73_m2} (ref >59)
GFR calc non Af Amer: 59 mL/min/{1.73_m2} — ABNORMAL LOW (ref >59)
Glucose: 128 mg/dL — ABNORMAL HIGH (ref 65–99)
Potassium: 4.4 mmol/L (ref 3.5–5.2)
Sodium: 138 mmol/L (ref 134–144)

## 2018-02-09 NOTE — Telephone Encounter (Signed)
1 prefilled syringe Ordered Date: 02/08/18 Shipping Date: 02/12/18

## 2018-02-09 NOTE — Telephone Encounter (Signed)
ERROR

## 2018-02-09 NOTE — Patient Instructions (Signed)
Medication Instructions:  Your physician recommends that you continue on your current medications as directed. Please refer to the Current Medication list given to you today.  -- If you need a refill on your cardiac medications before your next appointment, please call your pharmacy. --  Labwork: BMET Today  Testing/Procedures: None ordered  Follow-Up: Your physician wants you to follow-up in September with Dr Theressa Stamps will receive a reminder letter in the mail two months in advance. If you don't receive a letter, please call our office to schedule the follow-up appointment.  Thank you for choosing [facility]!!    Any Other Special Instructions Will Be Listed Below (If Applicable).

## 2018-02-09 NOTE — Progress Notes (Signed)
Patient came in for Nurse Visit to check BP after starting Aldactone 2 months ago.  Her automatic BP- 141/83 and manual BP- 130/76.  No changes with medication, but BMET was drawn to check Potassium level.

## 2018-02-14 NOTE — Telephone Encounter (Signed)
Dupixent came in today. It was one of 40 that didn't ship. Med was supposed to come in 02/13/18.  It came in today. 1 prefilled syringe Arrival Date: 02/14/18 Lot #:[id number] Exp date: 04/2019

## 2018-02-16 ENCOUNTER — Telehealth: Payer: Self-pay | Admitting: Internal Medicine

## 2018-02-16 DIAGNOSIS — J45998 Other asthma: Secondary | ICD-10-CM

## 2018-02-16 DIAGNOSIS — F411 Generalized anxiety disorder: Secondary | ICD-10-CM

## 2018-02-16 MED ORDER — RANITIDINE HCL 300 MG PO TABS: 300.0000 mg | ORAL_TABLET | Freq: Every day | ORAL | 5 refills | Status: DC

## 2018-02-16 MED ORDER — HYDROXYZINE HCL 25 MG PO TABS: ORAL_TABLET | ORAL | 6 refills | Status: DC

## 2018-02-16 NOTE — Telephone Encounter (Signed)
Dr. Wynetta Emery you haven't filled her zantac before

## 2018-02-16 NOTE — Telephone Encounter (Signed)
Patient called requesting a refill on the following medications:   ranitidine (ZANTAC) 300 MG tablet hydrOXYzine (ATARAX/VISTARIL) 25 MG tablet  Patient uses [facility] on [address].

## 2018-02-19 ENCOUNTER — Telehealth: Payer: Self-pay | Admitting: Pulmonary Disease

## 2018-02-19 ENCOUNTER — Telehealth: Payer: Self-pay | Admitting: *Deleted

## 2018-02-19 ENCOUNTER — Ambulatory Visit (INDEPENDENT_AMBULATORY_CARE_PROVIDER_SITE_OTHER): Payer: Medicaid Other

## 2018-02-19 DIAGNOSIS — J45998 Other asthma: Secondary | ICD-10-CM | POA: Diagnosis not present

## 2018-02-19 NOTE — Telephone Encounter (Signed)
1 prefilled syringe Ordered Date: 02/19/18 Shipping Date: 02/26/18

## 2018-02-19 NOTE — Telephone Encounter (Signed)
Gave pt first dupixent inj : 600 mg, SubQ, L & RA. (2 injs) Monitored pt every 20 mins for two hrs.. No local reaction. Pt. Did bring Epi-pen, I showed her how to use it, and explained when she would need to use it. She did not need it while she was here.  Nothing further needed.

## 2018-02-20 MED ORDER — DUPILUMAB 300 MG/2ML ~~LOC~~ SOSY
300.0000 mg | PREFILLED_SYRINGE | Freq: Once | SUBCUTANEOUS | Status: AC
  Administered 2018-02-19: 300 mg via SUBCUTANEOUS

## 2018-02-20 NOTE — Progress Notes (Signed)
Documentation of medication administration and charges of Dupixent have been completed by Desmond Dike, CMA based on the [place] documentation sheet completed by [facility].

## 2018-02-23 ENCOUNTER — Telehealth: Payer: Self-pay | Admitting: Internal Medicine

## 2018-02-23 ENCOUNTER — Other Ambulatory Visit: Payer: Medicaid Other | Admitting: *Deleted

## 2018-02-23 DIAGNOSIS — Z8781 Personal history of (healed) traumatic fracture: Secondary | ICD-10-CM

## 2018-02-23 DIAGNOSIS — E785 Hyperlipidemia, unspecified: Secondary | ICD-10-CM

## 2018-02-23 LAB — LIPID PANEL
Chol/HDL Ratio: 2.5 ratio (ref 0.0–4.4)
Cholesterol, Total: 130 mg/dL (ref 100–199)
HDL: 52 mg/dL (ref >39)
LDL Calculated: 59 mg/dL (ref 0–99)
Triglycerides: 95 mg/dL (ref 0–149)
VLDL Cholesterol Cal: 19 mg/dL (ref 5–40)

## 2018-02-23 NOTE — Telephone Encounter (Signed)
Will forward to [place]

## 2018-02-23 NOTE — Telephone Encounter (Signed)
-----   Message from Gayland Curry sent at 02/22/2018  4:57 PM EDT ----- Regarding: New order request  Good afternoon,   My name is Baxter Kail, Water engineer at [facility]. The intent of this message is to request a new order for the above patient. Unfortunately we are unable to use the diagnoses provided if the she has not had a previous DEXA. We can use, vertebral fractures, estrogen deficient or osteopenia if any of these match her history.   Thank you in advance for your help.

## 2018-03-01 NOTE — Telephone Encounter (Signed)
1 prefilled syringe Arrival Date: 02/27/18 Lot #:[id number] Exp date: 07/2019

## 2018-03-02 ENCOUNTER — Other Ambulatory Visit: Payer: Self-pay | Admitting: *Deleted

## 2018-03-02 ENCOUNTER — Telehealth: Payer: Self-pay | Admitting: *Deleted

## 2018-03-02 ENCOUNTER — Telehealth: Payer: Self-pay | Admitting: Internal Medicine

## 2018-03-02 DIAGNOSIS — G8929 Other chronic pain: Secondary | ICD-10-CM

## 2018-03-02 DIAGNOSIS — M545 Low back pain, unspecified: Secondary | ICD-10-CM

## 2018-03-02 DIAGNOSIS — IMO0001 Reserved for inherently not codable concepts without codable children: Secondary | ICD-10-CM

## 2018-03-02 DIAGNOSIS — M549 Dorsalgia, unspecified: Secondary | ICD-10-CM

## 2018-03-02 DIAGNOSIS — E1165 Type 2 diabetes mellitus with hyperglycemia: Principal | ICD-10-CM

## 2018-03-02 MED ORDER — METHOCARBAMOL 750 MG PO TABS: 750.0000 mg | ORAL_TABLET | Freq: Two times a day (BID) | ORAL | 1 refills | Status: DC | PRN

## 2018-03-02 MED ORDER — GABAPENTIN 400 MG PO CAPS: 800.0000 mg | ORAL_CAPSULE | Freq: Three times a day (TID) | ORAL | 0 refills | Status: DC

## 2018-03-02 NOTE — Telephone Encounter (Signed)
Patient called requesting a refill on the following medications:   gabapentin (NEURONTIN) 400 MG capsule   methocarbamol (ROBAXIN) 750 MG tablet   Patient uses [facility] on [address]. Please f/u

## 2018-03-04 ENCOUNTER — Other Ambulatory Visit: Payer: Self-pay | Admitting: Internal Medicine

## 2018-03-04 DIAGNOSIS — G8929 Other chronic pain: Secondary | ICD-10-CM

## 2018-03-04 DIAGNOSIS — M549 Dorsalgia, unspecified: Principal | ICD-10-CM

## 2018-03-04 MED ORDER — METHOCARBAMOL 750 MG PO TABS: 750.0000 mg | ORAL_TABLET | Freq: Two times a day (BID) | ORAL | 1 refills | Status: DC | PRN

## 2018-03-06 ENCOUNTER — Ambulatory Visit (INDEPENDENT_AMBULATORY_CARE_PROVIDER_SITE_OTHER): Payer: Medicaid Other

## 2018-03-06 DIAGNOSIS — J4550 Severe persistent asthma, uncomplicated: Secondary | ICD-10-CM | POA: Diagnosis not present

## 2018-03-07 ENCOUNTER — Ambulatory Visit: Payer: Medicaid Other | Admitting: Pulmonary Disease

## 2018-03-07 MED ORDER — DUPILUMAB 300 MG/2ML ~~LOC~~ SOSY
300.0000 mg | PREFILLED_SYRINGE | Freq: Once | SUBCUTANEOUS | Status: AC
  Administered 2018-03-06: 300 mg via SUBCUTANEOUS

## 2018-03-14 ENCOUNTER — Telehealth: Payer: Self-pay | Admitting: Pulmonary Disease

## 2018-03-14 NOTE — Telephone Encounter (Signed)
#   prefilled syringes:  Ordered date:03/19/2018 Shipping Date:03/20/2018  Routing to [employer] to follow up on.

## 2018-03-21 ENCOUNTER — Ambulatory Visit (INDEPENDENT_AMBULATORY_CARE_PROVIDER_SITE_OTHER): Payer: Medicaid Other

## 2018-03-21 ENCOUNTER — Ambulatory Visit: Payer: Medicaid Other

## 2018-03-21 DIAGNOSIS — J4551 Severe persistent asthma with (acute) exacerbation: Secondary | ICD-10-CM | POA: Diagnosis not present

## 2018-03-21 NOTE — Telephone Encounter (Signed)
1 prefilled syringe Arrival Date: 03/21/18 Lot #:[id number] Exp date: 07/2019

## 2018-03-22 DIAGNOSIS — J4551 Severe persistent asthma with (acute) exacerbation: Secondary | ICD-10-CM

## 2018-03-22 MED ORDER — DUPILUMAB 300 MG/2ML ~~LOC~~ SOSY
300.0000 mg | PREFILLED_SYRINGE | Freq: Once | SUBCUTANEOUS | Status: AC
  Administered 2018-03-22: 300 mg via SUBCUTANEOUS

## 2018-03-22 NOTE — Progress Notes (Signed)
Documentation of medication administration and charges of Dupxient have been completed by Catherin Doorn, CMA based on the Dupxient documentation sheet completed by Tammy Scott.   

## 2018-03-29 ENCOUNTER — Other Ambulatory Visit: Payer: Self-pay | Admitting: Pharmacist

## 2018-03-29 MED ORDER — METHOCARBAMOL 750 MG PO TABS: 750.0000 mg | ORAL_TABLET | Freq: Three times a day (TID) | ORAL | 0 refills | Status: DC

## 2018-04-03 ENCOUNTER — Ambulatory Visit: Payer: Medicaid Other

## 2018-04-05 ENCOUNTER — Ambulatory Visit (INDEPENDENT_AMBULATORY_CARE_PROVIDER_SITE_OTHER): Payer: Medicaid Other

## 2018-04-05 ENCOUNTER — Encounter: Payer: Self-pay | Admitting: Pulmonary Disease

## 2018-04-05 ENCOUNTER — Ambulatory Visit (INDEPENDENT_AMBULATORY_CARE_PROVIDER_SITE_OTHER): Payer: Medicaid Other | Admitting: Pulmonary Disease

## 2018-04-05 VITALS — BP 130/80 | HR 80 | Ht <= 58 in | Wt 212.0 lb

## 2018-04-05 DIAGNOSIS — J4550 Severe persistent asthma, uncomplicated: Secondary | ICD-10-CM

## 2018-04-05 MED ORDER — PREDNISONE 1 MG PO TABS: ORAL_TABLET | ORAL | 1 refills | Status: DC

## 2018-04-05 NOTE — Patient Instructions (Signed)
Severe persistent asthma, allergic, eosinophilic: Continue Dulera and Singulair Continue albuterol as needed for chest tightness wheezing or shortness of breath Continue using Dupixent, it is okay for you to do the injections at home Decrease prednisone from 5 mg daily to 4 mg daily When you come back in 6 to 8 weeks if you are not having problems with chest tightness or wheezing we will decrease prednisone further (to 3 mg). In general, I would like to try to get you off of prednisone over the next 6 to 12 months.  We will see you back in 6-8 weeks or sooner if needed

## 2018-04-05 NOTE — Progress Notes (Signed)
Subjective:    Patient ID: Raven Turner, female    DOB: November 06, 1970, 47 y.o.   MRN: [id number]  Synopsis: Former patient of Dr. Melvyn Novas with recurrent wheeze, shortness of breath and cough felt to be due to either vocal cord dysfunction or asthma.  [facility]uated by [facility] in 2017, no paradoxical movements of the vocal cords noted   HPI Chief Complaint  Patient presents with  . Follow-up    pt states breathing has improved since last OV. c/o sob with exertion, chest tightness & wheezing   She says she has been doing well since the last visit but unfortunately she has been gaining weight this year.  She is picked up 9 or 10 pounds.  She says that in general her breathing has improved.  She has not had too much trouble with cough.  She says that her wheezing is improved and she is not having as much trouble with chest tightness or pressure.  However, the hot weather has still been bothersome for her and minimal activity such as bathing or just going to the bathroom has made her short of breath when it so hot outside.  That being said, the shortness of breath does not seem to be due to her asthma symptoms as she is only really needed to use albuterol for 5 times over the last several months.  She has not been sick with something like bronchitis or pneumonia.  She is using her injectable dupilumab regularly.  Past Medical History:  Diagnosis Date  . Anemia   . Asthma   . Bilateral hand numbness 06/25/2015  . Bone pain 02/27/2015   Diffuse bone pain in legs mostly but also in arms    . Breast pain, left 03/27/2015  . Callus of heel 12/16/2014  . Compression fracture   . Compression fracture of L1 lumbar vertebra (HCC) 08/26/2014  . Compression fracture of T12 vertebra ([place]) 08/28/2014  . COPD (chronic obstructive pulmonary disease) ([place]) 2013  . Cushing syndrome ([place]) 2013   . Depression   . Diabetes ([place]) 2013   . Diabetes mellitus type 2, uncontrolled ([place])  08/26/2014   Sees Dr. Katy Fitch.  Last visit 11/27/14.  No diabetic retinopathy    . Diabetes mellitus with neuropathy ([place])   . Diastolic dysfunction without heart failure 10/14/2014   Grade 2 diastolic dysfunction 2/2 pulm HTN   . Generalized anxiety disorder 03/27/2015  . HTN (hypertension) 2005   . Hx of cataract surgery 11/03/2014  . LUQ abdominal pain 07/17/2015  . Medication management 05/12/2015  . Morbid obesity ([place]) 09/08/2014  . 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 Non-English speaking patient 09/08/2014  . OSA (obstructive sleep apnea) 05/20/2015  . Osteopenia 11/19/2013   Dx with osteopenia in lumbar vertebra with partial compression of L1  . Pain due to onychomycosis of toenail 03/27/2015  . Secondary Cushing's syndrome/ iatrogenic   . Skin rash 07/17/2015  . Tachycardia 01/13/2015  . Upper airway cough syndrome 09/05/2014   Followed in Pulmonary clinic/ [facility]/ [place]  - Trial off advair      09/05/2014 >>>  - trial off theoph 09/17/2014 and added flutter  - rec reduce dulera to 100 2 bid 07/13/2015     . Vision changes 06/25/2015  . Vitamin D deficiency 08/28/2014        Review of Systems  Constitutional: Negative for chills, fatigue and fever.  HENT: Positive for rhinorrhea. Negative for postnasal drip and sinus pressure.  Respiratory: Positive for cough, shortness of breath and wheezing.   Cardiovascular: Negative for chest pain, palpitations and leg swelling.  Hematological: Bruises/bleeds easily: .dbmfu.       Objective:   Physical Exam Vitals:   04/05/18 0932  BP: 130/80  Pulse: 80  SpO2: 98%  Weight: 212 lb (96.2 kg)  Height: 4' 10"  (1.473 m)  RA  Gen: obese, chronically ill appearing HENT: OP clear, TM's clear, neck supple PULM: CTA B, normal percussion CV: RRR, no mgr, trace edema GI: BS+, soft, nontender Derm: no cyanosis or rash Psyche: normal mood and affect   PFT 11/03/14  PFTs p am dulera 100 on 30 mg prednisone  FEV1 1.56(73%) ratio 85 =  No  obst, including fef 25-75%, only abn is reduction in ERV spirometry 03/31/15 1.48 (62%) ratio 82  p am dulera and while symptomatic requesting saba  Labs July 2017 IgE 55 (normal), CBC without eosinophils February 2019 CBC with differential showed 400 eosinophils per deciliter  Chest imaging: March 2019 high-resolution CT scan of the chest images independently reviewed showing some mild air trapping but no interstitial lung disease.      Assessment & Plan:  Severe persistent asthma, unspecified whether complicated  Discussion: She has been doing well since we started the IL-4/13 inhibitor antibody which will hopefully help her get off of prednisone.  She still has some shortness of breath which I think is due predominantly to weight gain and obesity as she does not describe any problems with asthma control.  She is asking about using her injectable antibody at home, I have no problem with this at this point as she seems to be stable and she is very comfortable with injections of insulin already.  We need to start slowly backing down on her prednisone dose as this is contributing to her obesity.  Plan: Severe persistent asthma, allergic, eosinophilic: Continue Dulera and Singulair Continue albuterol as needed for chest tightness wheezing or shortness of breath Continue using Dupixent, it is okay for you to do the injections at home Decrease prednisone from 5 mg daily to 4 mg daily When you come back in 6 to 8 weeks if you are not having problems with chest tightness or wheezing we will decrease prednisone further (to 3 mg). In general, I would like to try to get you off of prednisone over the next 6 to 12 months.  We will see you back in 6-8 weeks or sooner if needed  > 15 minutes spent with her, 25 min visit   Current Outpatient Medications:  .  albuterol (PROVENTIL HFA;VENTOLIN HFA) 108 (90 Base) MCG/ACT inhaler, Inhale 2 puffs into the lungs every 4 (four) hours as needed for  wheezing or shortness of breath (((PLAN B)))., Disp: 18 g, Rfl: 6 .  albuterol (PROVENTIL) (2.5 MG/3ML) 0.083% nebulizer solution, USE 1 VIAL VIA NEBULIZER EVERY 4 HOURS AS NEEDED FOR WHEEZING OR SHORTNESS OF BREATH, Disp: 90 mL, Rfl: 0 .  aspirin EC 81 MG tablet, Take 1 tablet (81 mg total) by mouth daily., Disp: 90 tablet, Rfl: 3 .  Blood Glucose Monitoring Suppl (ACCU-CHEK AVIVA PLUS) W/DEVICE KIT, Used as directed, Disp: 1 kit, Rfl: 0 .  buprenorphine (BUTRANS - DOSED MCG/HR) 5 MCG/HR PTWK patch, Place 5 mcg onto the skin once a week., Disp: , Rfl:  .  cabergoline (DOSTINEX) 0.5 MG tablet, Take 0.5 tablets (0.25 mg total) by mouth 2 (two) times a week., Disp: 10 tablet, Rfl: 3 .  cholecalciferol (VITAMIN  D) 1000 UNITS tablet, Take 1,000 Units by mouth every morning., Disp: , Rfl:  .  cloNIDine (CATAPRES) 0.2 MG tablet, Take 1 tablet (0.2 mg total) by mouth 3 (three) times daily., Disp: 90 tablet, Rfl: 1 .  clotrimazole-betamethasone (LOTRISONE) cream, Apply to affected area 2 times daily prn, Disp: 45 g, Rfl: 0 .  dextromethorphan-guaiFENesin (MUCINEX DM) 30-600 MG 12hr tablet, Take 1 tablet by mouth 2 (two) times daily as needed (with flutter valve for cough and congestion). Reported on 04/01/2016, Disp: , Rfl:  .  doxycycline (VIBRA-TABS) 100 MG tablet, Take 1 tablet (100 mg total) by mouth 2 (two) times daily., Disp: 10 tablet, Rfl: 0 .  ferrous sulfate 325 (65 FE) MG tablet, Take 1 tablet (325 mg total) by mouth daily with breakfast., Disp: 30 tablet, Rfl: 3 .  FOLIC ACID PO, Take 1 tablet by mouth every morning., Disp: , Rfl:  .  furosemide (LASIX) 20 MG tablet, Take 1 tablet (20 mg total) by mouth every morning., Disp: 30 tablet, Rfl: 2 .  gabapentin (NEURONTIN) 400 MG capsule, Take 2 capsules (800 mg total) by mouth 3 (three) times daily., Disp: 180 capsule, Rfl: 0 .  glucose blood (RELION GLUCOSE TEST STRIPS) test strip, 1 each by Other route 4 (four) times daily. ICD code E11.65, Disp: 400  each, Rfl: 3 .  HUMULIN R U-500 KWIKPEN 500 UNIT/ML kwikpen, Inject 120-180 Units into the skin 3 (three) times daily. 180 U before breakfast, 160 U before lunch, 140 U before dinner, Disp: 27 mL, Rfl: 11 .  hydrOXYzine (ATARAX/VISTARIL) 25 MG tablet, TAKE 1 TABLET BY MOUTH AT BEDTIME AS NEEDED FOR ANXIETY., Disp: 30 tablet, Rfl: 6 .  Insulin Pen Needle (B-D ULTRAFINE III SHORT PEN) 31G X 8 MM MISC, 1 application by Does not apply route daily., Disp: 100 each, Rfl: 3 .  Insulin Syringe-Needle U-100 (BD INSULIN SYRINGE ULTRAFINE) 31G X 15/64" 1 ML MISC, 1 each by Does not apply route 3 (three) times daily., Disp: 300 each, Rfl: 3 .  Lancet Devices (ACCU-CHEK SOFTCLIX) lancets, Use as instructed, Disp: 1 each, Rfl: 0 .  levothyroxine (SYNTHROID, LEVOTHROID) 25 MCG tablet, Take 25 mcg by mouth daily., Disp: , Rfl: 3 .  losartan (COZAAR) 100 MG tablet, Take 1 tablet (100 mg total) by mouth every morning., Disp: 30 tablet, Rfl: 6 .  metFORMIN (GLUCOPHAGE) 1000 MG tablet, TAKE 1 TABLET BY MOUTH TWICE DAILY WITH A MEAL, Disp: 60 tablet, Rfl: 2 .  methocarbamol (ROBAXIN) 750 MG tablet, Take 1 tablet (750 mg total) by mouth 3 (three) times daily., Disp: 60 tablet, Rfl: 0 .  Misc. Devices (CANE) MISC, 1 each by Does not apply route daily., Disp: 1 each, Rfl: 0 .  mometasone-formoterol (DULERA) 200-5 MCG/ACT AERO, Inhale 2 puffs into the lungs 2 (two) times daily., Disp: 1 Inhaler, Rfl: 5 .  montelukast (SINGULAIR) 10 MG tablet, Take 1 tablet (10 mg total) by mouth at bedtime., Disp: 90 tablet, Rfl: 0 .  mupirocin ointment (BACTROBAN) 2 %, Place 1 application into the nose 2 (two) times daily., Disp: 22 g, Rfl: 0 .  omeprazole (PRILOSEC) 20 MG capsule, TAKE ONE CAPSULE BY MOUTH EVERY DAY BEFORE BREAKFAST, Disp: 90 capsule, Rfl: 0 .  Oyster Shell Calcium 500 MG TABS, Take 1 tablet (1,250 mg total) by mouth daily., Disp: 90 tablet, Rfl: 3 .  PARoxetine (PAXIL) 20 MG tablet, Take 1 tablet (20 mg total) by mouth  daily., Disp: 90 tablet, Rfl: 0 .  ranitidine (ZANTAC) 300 MG tablet, Take 1 tablet (300 mg total) by mouth at bedtime., Disp: 30 tablet, Rfl: 5 .  ReliOn Ultra Thin Lancets MISC, 1 each by Does not apply route 4 (four) times daily. ICD code E11.65, Disp: 400 each, Rfl: 3 .  Respiratory Therapy Supplies (FLUTTER) DEVI, Use as directed, Disp: 1 each, Rfl: 0 .  Spacer/Aero-Holding Chambers (AEROCHAMBER MV) inhaler, Use as instructed, Disp: 1 each, Rfl: 0 .  spironolactone (ALDACTONE) 25 MG tablet, Take 0.5 tablets (12.5 mg total) by mouth daily., Disp: 45 tablet, Rfl: 3 .  triamcinolone cream (KENALOG) 0.1 %, Apply 1 application 2 (two) times daily topically., Disp: 30 g, Rfl: 0 .  ULTICARE MICRO PEN NEEDLES 32G X 4 MM MISC, USE AS DIRECTED, Disp: 100 each, Rfl: 2 .  verapamil (CALAN-SR) 180 MG CR tablet, Take 2 tablets (360 mg total) by mouth at bedtime., Disp: 180 tablet, Rfl: 2 .  atorvastatin (LIPITOR) 40 MG tablet, Take 1 tablet (40 mg total) by mouth daily., Disp: 90 tablet, Rfl: 3 .  predniSONE (DELTASONE) 1 MG tablet, 4 tabs (40m) daily., Disp: 120 tablet, Rfl: 1  Current Facility-Administered Medications:  .  betamethasone acetate-betamethasone sodium phosphate (CELESTONE) injection 3 mg, 3 mg, Intramuscular, Once, Evans, Brent M, DPM .  umeclidinium bromide (INCRUSE ELLIPTA) 62.5 MCG/INH 1 puff, 1 puff, Inhalation, Daily, JLadell Pier MD  Facility-Administered Medications Ordered in Other Visits:  .  DOBUTamine (DOBUTREX) 1,000 mcg/mL in dextrose 5% 250 mL infusion, 30 mcg/kg/min, Intravenous, Continuous, Croitoru, Mihai, MD, Last Rate: 145.3 mL/hr at 04/22/15 1300, 30 mcg/kg/min at 04/22/15 1300

## 2018-04-09 NOTE — Telephone Encounter (Signed)
Message created in error

## 2018-04-10 ENCOUNTER — Ambulatory Visit
Admission: RE | Admit: 2018-04-10 | Discharge: 2018-04-10 | Disposition: A | Discharge status: Home / Self Care | Payer: Medicaid Other | Source: Ambulatory Visit | Attending: Internal Medicine | Admitting: Internal Medicine

## 2018-04-10 DIAGNOSIS — Z8781 Personal history of (healed) traumatic fracture: Secondary | ICD-10-CM

## 2018-04-10 MED ORDER — DUPILUMAB 300 MG/2ML ~~LOC~~ SOSY
300.0000 mg | PREFILLED_SYRINGE | Freq: Once | SUBCUTANEOUS | Status: AC
  Administered 2018-04-05: 300 mg via SUBCUTANEOUS

## 2018-04-10 NOTE — Progress Notes (Signed)
Documentation of medication administration and charges of Dupxient have been completed by Desmond Dike, CMA based on the Dupxient documentation sheet completed by [facility].

## 2018-04-11 ENCOUNTER — Telehealth: Payer: Self-pay

## 2018-04-11 ENCOUNTER — Encounter: Payer: Self-pay | Admitting: Internal Medicine

## 2018-04-11 ENCOUNTER — Telehealth: Payer: Self-pay | Admitting: Pulmonary Disease

## 2018-04-11 DIAGNOSIS — M8000XS Age-related osteoporosis with current pathological fracture, unspecified site, sequela: Secondary | ICD-10-CM

## 2018-04-11 DIAGNOSIS — M810 Age-related osteoporosis without current pathological fracture: Secondary | ICD-10-CM

## 2018-04-11 NOTE — Progress Notes (Signed)
Received results of BMD.  Will forward copy to Dr Buddy Duty to request his eval for ongoing Reclast infusion vs another agent.

## 2018-04-11 NOTE — Telephone Encounter (Signed)
[place] interpreters [place] [phone]  contacted pt to go over bone density results pt is aware and doesn't have any questions or concerns

## 2018-04-11 NOTE — Telephone Encounter (Signed)
Returned Raven Turner's call, made her aware pt has her inj. For 04/19/18. She will need to reorder before next appt. 05/03/18. Lelan Pons said she would have it here on 04/24/18. Nothing further needed.

## 2018-04-17 ENCOUNTER — Other Ambulatory Visit: Payer: Self-pay | Admitting: Internal Medicine

## 2018-04-17 DIAGNOSIS — G8929 Other chronic pain: Secondary | ICD-10-CM

## 2018-04-17 DIAGNOSIS — F411 Generalized anxiety disorder: Secondary | ICD-10-CM

## 2018-04-17 DIAGNOSIS — M545 Low back pain, unspecified: Secondary | ICD-10-CM

## 2018-04-17 DIAGNOSIS — J45998 Other asthma: Secondary | ICD-10-CM

## 2018-04-17 MED ORDER — PAROXETINE HCL 20 MG PO TABS: 20.0000 mg | ORAL_TABLET | Freq: Every day | ORAL | 0 refills | Status: DC

## 2018-04-17 NOTE — Telephone Encounter (Signed)
RX sent to [facility] on [address]

## 2018-04-17 NOTE — Telephone Encounter (Signed)
Patient called requesting medication refill for PARoxetine (PAXIL) 20 MG tablet [[id number]] . Patient is aware of her appointment on 05/07/18 but would like medications until her appointment date. Please follow upwith patient.

## 2018-04-19 ENCOUNTER — Ambulatory Visit (INDEPENDENT_AMBULATORY_CARE_PROVIDER_SITE_OTHER): Payer: Medicaid Other

## 2018-04-19 ENCOUNTER — Telehealth: Payer: Self-pay | Admitting: Pulmonary Disease

## 2018-04-19 DIAGNOSIS — J4550 Severe persistent asthma, uncomplicated: Secondary | ICD-10-CM | POA: Diagnosis not present

## 2018-04-19 MED ORDER — OMEPRAZOLE 20 MG PO CPDR: DELAYED_RELEASE_CAPSULE | ORAL | 0 refills | Status: DC

## 2018-04-19 MED ORDER — MONTELUKAST SODIUM 10 MG PO TABS: 10.0000 mg | ORAL_TABLET | Freq: Every day | ORAL | 0 refills | Status: DC

## 2018-04-19 NOTE — Telephone Encounter (Signed)
Patient called requesting a refill on the following medications:   omeprazole (PRILOSEC) 20 MG capsule  montelukast (SINGULAIR) 10 MG tablet gabapentin (NEURONTIN) 400 MG capsule   Patient uses [facility] on [address]

## 2018-04-19 NOTE — Telephone Encounter (Signed)
Pt. Will be ordering and giving her injections from home now, on BQ's indication in his last ov notes. I will call Lelan Pons at [facility] and make her aware so their not sending her Dupixent to [place] anymore. I spoke with Lelan Pons and told her the pt will be calling them to order her Dupixent 04/26/18. Lelan Pons said they([facility], pt may need a translator) would call the pt., they need to speak with her first.  Pt. Has all the info she needs. Nothing further needed.

## 2018-04-20 MED ORDER — DUPILUMAB 300 MG/2ML ~~LOC~~ SOSY
300.0000 mg | PREFILLED_SYRINGE | Freq: Once | SUBCUTANEOUS | Status: AC
  Administered 2018-04-19: 300 mg via SUBCUTANEOUS

## 2018-04-20 MED ORDER — GABAPENTIN 400 MG PO CAPS: 800.0000 mg | ORAL_CAPSULE | Freq: Three times a day (TID) | ORAL | 3 refills | Status: AC

## 2018-04-20 NOTE — Progress Notes (Signed)
Documentation of medication administration and charges of Dupxient have been completed by Desmond Dike, CMA based on the Dupxient documentation sheet completed by [facility].

## 2018-04-20 NOTE — Telephone Encounter (Signed)
Patient called requesting a refill on Gabapentin. Pt. Uses [facility] pharmacy on [address].

## 2018-04-27 ENCOUNTER — Telehealth: Payer: Self-pay | Admitting: Internal Medicine

## 2018-04-27 DIAGNOSIS — I10 Essential (primary) hypertension: Secondary | ICD-10-CM

## 2018-04-27 MED ORDER — CLONIDINE HCL 0.2 MG PO TABS: 0.2000 mg | ORAL_TABLET | Freq: Three times a day (TID) | ORAL | 0 refills | Status: DC

## 2018-04-27 NOTE — Telephone Encounter (Signed)
Patient called requesting a refill on cloNIDine (CATAPRES) 0.2 MG tablet  Patient uses [facility]. Please f/u

## 2018-05-03 ENCOUNTER — Ambulatory Visit: Payer: Medicaid Other | Admitting: Internal Medicine

## 2018-05-09 ENCOUNTER — Ambulatory Visit: Payer: Medicaid Other | Attending: Family Medicine | Admitting: Family Medicine

## 2018-05-09 ENCOUNTER — Encounter

## 2018-05-09 VITALS — BP 109/69 | HR 88 | Temp 98.5°F | Ht <= 58 in | Wt 215.6 lb

## 2018-05-09 DIAGNOSIS — R42 Dizziness and giddiness: Secondary | ICD-10-CM | POA: Diagnosis not present

## 2018-05-09 DIAGNOSIS — J4550 Severe persistent asthma, uncomplicated: Secondary | ICD-10-CM | POA: Insufficient documentation

## 2018-05-09 DIAGNOSIS — R3129 Other microscopic hematuria: Secondary | ICD-10-CM | POA: Insufficient documentation

## 2018-05-09 DIAGNOSIS — G4733 Obstructive sleep apnea (adult) (pediatric): Secondary | ICD-10-CM | POA: Insufficient documentation

## 2018-05-09 DIAGNOSIS — Z794 Long term (current) use of insulin: Secondary | ICD-10-CM | POA: Insufficient documentation

## 2018-05-09 DIAGNOSIS — F411 Generalized anxiety disorder: Secondary | ICD-10-CM

## 2018-05-09 DIAGNOSIS — F329 Major depressive disorder, single episode, unspecified: Secondary | ICD-10-CM | POA: Insufficient documentation

## 2018-05-09 DIAGNOSIS — E1140 Type 2 diabetes mellitus with diabetic neuropathy, unspecified: Secondary | ICD-10-CM | POA: Insufficient documentation

## 2018-05-09 DIAGNOSIS — D649 Anemia, unspecified: Secondary | ICD-10-CM | POA: Insufficient documentation

## 2018-05-09 DIAGNOSIS — J449 Chronic obstructive pulmonary disease, unspecified: Secondary | ICD-10-CM | POA: Insufficient documentation

## 2018-05-09 DIAGNOSIS — E11649 Type 2 diabetes mellitus with hypoglycemia without coma: Secondary | ICD-10-CM

## 2018-05-09 DIAGNOSIS — G8929 Other chronic pain: Secondary | ICD-10-CM

## 2018-05-09 DIAGNOSIS — Z7982 Long term (current) use of aspirin: Secondary | ICD-10-CM | POA: Insufficient documentation

## 2018-05-09 DIAGNOSIS — D6489 Other specified anemias: Secondary | ICD-10-CM

## 2018-05-09 DIAGNOSIS — I110 Hypertensive heart disease with heart failure: Secondary | ICD-10-CM | POA: Diagnosis not present

## 2018-05-09 DIAGNOSIS — I272 Pulmonary hypertension, unspecified: Secondary | ICD-10-CM | POA: Insufficient documentation

## 2018-05-09 DIAGNOSIS — E242 Drug-induced Cushing's syndrome: Secondary | ICD-10-CM | POA: Insufficient documentation

## 2018-05-09 DIAGNOSIS — M545 Low back pain: Secondary | ICD-10-CM | POA: Insufficient documentation

## 2018-05-09 DIAGNOSIS — Z79899 Other long term (current) drug therapy: Secondary | ICD-10-CM | POA: Diagnosis not present

## 2018-05-09 LAB — POCT GLYCOSYLATED HEMOGLOBIN (HGB A1C): Hemoglobin A1C: 8.3 % — AB (ref 4.0–5.6)

## 2018-05-09 LAB — GLUCOSE, POCT (MANUAL RESULT ENTRY): POC Glucose: 226 mg/dl — AB (ref 70–99)

## 2018-05-09 MED ORDER — OMEPRAZOLE 20 MG PO CPDR: DELAYED_RELEASE_CAPSULE | ORAL | 6 refills | Status: DC

## 2018-05-09 MED ORDER — MONTELUKAST SODIUM 10 MG PO TABS: 10.0000 mg | ORAL_TABLET | Freq: Every day | ORAL | 6 refills | Status: DC

## 2018-05-09 MED ORDER — PAROXETINE HCL 20 MG PO TABS: 20.0000 mg | ORAL_TABLET | Freq: Every day | ORAL | 6 refills | Status: DC

## 2018-05-09 NOTE — Progress Notes (Signed)
Subjective:  Patient ID: Raven Turner, female    DOB: 09/12/1971  Age: 47 y.o. MRN: [id number]  CC: Diabetes   HPI Raven Turner is a 47 year old female with a history of Asthma, Type 2 Diabetes Mellitus (A1c 8.8), Anxiety, chronic low back pain from vertebral compression fractures, steroid induced Cushing's, steroid induced Osteoporosis, Diastolic CHF (Ef 32-99%) who presents today to establish care with me. She was previously followed by Dr Wynetta Emery. She complains of dizziness on lying down but denies vertigo, otalgia, tinnitus, headaches, nausea, visual symptoms; she has a history of anemia. Her Diabetes is managed by Endocrine and she sees pain management for her low back pain Recently seen by Pulmonary, Dr Lake Bells on 04/05/18 and denies asthma exacerbation . Doing well on Dupixent, Prednisone, Dulera, Singulair. Seen by Cardiology, Dr Harrington Challenger in 11/2017 and was started on Aldactone. She brings in urinalysis result from her Endocrinologist which revealed the presence of rbc,1+proteinuria, wbc and she would like some interpretation. I explained to [place] that she has microalbuminuria from her Diabetes and we  Will repeat her urine test to evaluate for hematuria .  Past Medical History:  Diagnosis Date  . Anemia   . Asthma   . Bilateral hand numbness 06/25/2015  . Bone pain 02/27/2015   Diffuse bone pain in legs mostly but also in arms    . Breast pain, left 03/27/2015  . Callus of heel 12/16/2014  . Compression fracture   . Compression fracture of L1 lumbar vertebra (HCC) 08/26/2014  . Compression fracture of T12 vertebra ([place]) 08/28/2014  . COPD (chronic obstructive pulmonary disease) ([place]) 2013  . Cushing syndrome ([place]) 2013   . Depression   . Diabetes ([place]) 2013   . Diabetes mellitus type 2, uncontrolled ([place]) 08/26/2014   Sees Dr. Katy Fitch.  Last visit 11/27/14.  No diabetic retinopathy    . Diabetes mellitus with neuropathy ([place])   . Diastolic dysfunction without  heart failure 10/14/2014   Grade 2 diastolic dysfunction 2/2 pulm HTN   . Generalized anxiety disorder 03/27/2015  . HTN (hypertension) 2005   . Hx of cataract surgery 11/03/2014  . LUQ abdominal pain 07/17/2015  . Medication management 05/12/2015  . Morbid obesity ([place]) 09/08/2014  . 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 Non-English speaking patient 09/08/2014  . OSA (obstructive sleep apnea) 05/20/2015  . Osteopenia 11/19/2013   Dx with osteopenia in lumbar vertebra with partial compression of L1  . Pain due to onychomycosis of toenail 03/27/2015  . Secondary Cushing's syndrome/ iatrogenic   . Skin rash 07/17/2015  . Tachycardia 01/13/2015  . Upper airway cough syndrome 09/05/2014   Followed in Pulmonary clinic/ [facility]/ [place]  - Trial off advair      09/05/2014 >>>  - trial off theoph 09/17/2014 and added flutter  - rec reduce dulera to 100 2 bid 07/13/2015     . Vision changes 06/25/2015  . Vitamin D deficiency 08/28/2014   Past Surgical History:  Procedure Laterality Date  . CATARACT EXTRACTION Bilateral 2014  . HAND SURGERY Right 12/18/2015  . VAGINAL HYSTERECTOMY     Allergies  Allergen Reactions  . Shellfish Allergy Anaphylaxis  . Septra [Sulfamethoxazole-Trimethoprim] Rash, Other (See Comments) and Itching    Facial redness with pruritus  Facial redness with pruritus   . Hydrocortisone Rash     Outpatient Medications Prior to Visit  Medication Sig Dispense Refill  . albuterol (PROVENTIL HFA;VENTOLIN HFA) 108 (90 Base) MCG/ACT inhaler Inhale 2 puffs into the  lungs every 4 (four) hours as needed for wheezing or shortness of breath (((PLAN B))). 18 g 6  . albuterol (PROVENTIL) (2.5 MG/3ML) 0.083% nebulizer solution USE 1 VIAL VIA NEBULIZER EVERY 4 HOURS AS NEEDED FOR WHEEZING OR SHORTNESS OF BREATH 90 mL 0  . aspirin EC 81 MG tablet Take 1 tablet (81 mg total) by mouth daily. 90 tablet 3  . Blood Glucose Monitoring Suppl (ACCU-CHEK AVIVA PLUS) W/DEVICE KIT Used as directed 1 kit  0  . buprenorphine (BUTRANS - DOSED MCG/HR) 5 MCG/HR PTWK patch Place 5 mcg onto the skin once a week.    . cabergoline (DOSTINEX) 0.5 MG tablet Take 0.5 tablets (0.25 mg total) by mouth 2 (two) times a week. 10 tablet 3  . cholecalciferol (VITAMIN D) 1000 UNITS tablet Take 1,000 Units by mouth every morning.    . cloNIDine (CATAPRES) 0.2 MG tablet Take 1 tablet (0.2 mg total) by mouth 3 (three) times daily. MUST KEEP APPT ON 05/09/18 FOR FURTHER REFILLS 40 tablet 0  . doxycycline (VIBRA-TABS) 100 MG tablet Take 1 tablet (100 mg total) by mouth 2 (two) times daily. 10 tablet 0  . ferrous sulfate 325 (65 FE) MG tablet Take 1 tablet (325 mg total) by mouth daily with breakfast. 30 tablet 3  . FOLIC ACID PO Take 1 tablet by mouth every morning.    . furosemide (LASIX) 20 MG tablet Take 1 tablet (20 mg total) by mouth every morning. 30 tablet 2  . gabapentin (NEURONTIN) 400 MG capsule Take 2 capsules (800 mg total) by mouth 3 (three) times daily. 180 capsule 3  . glucose blood (RELION GLUCOSE TEST STRIPS) test strip 1 each by Other route 4 (four) times daily. ICD code E11.65 400 each 3  . HUMULIN R U-500 KWIKPEN 500 UNIT/ML kwikpen Inject 120-180 Units into the skin 3 (three) times daily. 180 U before breakfast, 160 U before lunch, 140 U before dinner 27 mL 11  . hydrOXYzine (ATARAX/VISTARIL) 25 MG tablet TAKE 1 TABLET BY MOUTH AT BEDTIME AS NEEDED FOR ANXIETY. 30 tablet 6  . Insulin Pen Needle (B-D ULTRAFINE III SHORT PEN) 31G X 8 MM MISC 1 application by Does not apply route daily. 100 each 3  . Insulin Syringe-Needle U-100 (BD INSULIN SYRINGE ULTRAFINE) 31G X 15/64" 1 ML MISC 1 each by Does not apply route 3 (three) times daily. 300 each 3  . Lancet Devices (ACCU-CHEK SOFTCLIX) lancets Use as instructed 1 each 0  . levothyroxine (SYNTHROID, LEVOTHROID) 25 MCG tablet Take 25 mcg by mouth daily.  3  . losartan (COZAAR) 100 MG tablet Take 1 tablet (100 mg total) by mouth every morning. 30 tablet 6  .  metFORMIN (GLUCOPHAGE) 1000 MG tablet TAKE 1 TABLET BY MOUTH TWICE DAILY WITH A MEAL 60 tablet 2  . methocarbamol (ROBAXIN) 750 MG tablet Take 1 tablet (750 mg total) by mouth 3 (three) times daily. 60 tablet 0  . Misc. Devices (CANE) MISC 1 each by Does not apply route daily. 1 each 0  . mometasone-formoterol (DULERA) 200-5 MCG/ACT AERO Inhale 2 puffs into the lungs 2 (two) times daily. 1 Inhaler 5  . mupirocin ointment (BACTROBAN) 2 % Place 1 application into the nose 2 (two) times daily. 22 g 0  . Oyster Shell Calcium 500 MG TABS Take 1 tablet (1,250 mg total) by mouth daily. 90 tablet 3  . predniSONE (DELTASONE) 1 MG tablet 4 tabs ('4mg'$ ) daily. 120 tablet 1  . ranitidine (ZANTAC) 300 MG  tablet Take 1 tablet (300 mg total) by mouth at bedtime. 30 tablet 5  . ReliOn Ultra Thin Lancets MISC 1 each by Does not apply route 4 (four) times daily. ICD code E11.65 400 each 3  . Respiratory Therapy Supplies (FLUTTER) DEVI Use as directed 1 each 0  . Spacer/Aero-Holding Chambers (AEROCHAMBER MV) inhaler Use as instructed 1 each 0  . spironolactone (ALDACTONE) 25 MG tablet Take 0.5 tablets (12.5 mg total) by mouth daily. 45 tablet 3  . triamcinolone cream (KENALOG) 0.1 % Apply 1 application 2 (two) times daily topically. 30 g 0  . ULTICARE MICRO PEN NEEDLES 32G X 4 MM MISC USE AS DIRECTED 100 each 2  . verapamil (CALAN-SR) 180 MG CR tablet Take 2 tablets (360 mg total) by mouth at bedtime. 180 tablet 2  . montelukast (SINGULAIR) 10 MG tablet Take 1 tablet (10 mg total) by mouth at bedtime. 30 tablet 0  . omeprazole (PRILOSEC) 20 MG capsule TAKE ONE CAPSULE BY MOUTH EVERY DAY BEFORE BREAKFAST 30 capsule 0  . PARoxetine (PAXIL) 20 MG tablet Take 1 tablet (20 mg total) by mouth daily. 30 tablet 0  . atorvastatin (LIPITOR) 40 MG tablet Take 1 tablet (40 mg total) by mouth daily. 90 tablet 3  . clotrimazole-betamethasone (LOTRISONE) cream Apply to affected area 2 times daily prn (Patient not taking: Reported on  05/09/2018) 45 g 0  . dextromethorphan-guaiFENesin (MUCINEX DM) 30-600 MG 12hr tablet Take 1 tablet by mouth 2 (two) times daily as needed (with flutter valve for cough and congestion). Reported on 04/01/2016    . DUPIXENT 300 MG/2ML SOSY Inject 1 pen/syringe subcutaneously every other week STARTING ON DAY 15  6   Facility-Administered Medications Prior to Visit  Medication Dose Route Frequency Provider Last Rate Last Dose  . betamethasone acetate-betamethasone sodium phosphate (CELESTONE) injection 3 mg  3 mg Intramuscular Once Daylene Katayama M, DPM      . DOBUTamine (DOBUTREX) 1,000 mcg/mL in dextrose 5% 250 mL infusion  30 mcg/kg/min Intravenous Continuous Croitoru, Mihai, MD 145.3 mL/hr at 04/22/15 1300 30 mcg/kg/min at 04/22/15 1300  . umeclidinium bromide (INCRUSE ELLIPTA) 62.5 MCG/INH 1 puff  1 puff Inhalation Daily Ladell Pier, MD        ROS Review of Systems  Constitutional: Negative for activity change, appetite change and fatigue.  HENT: Negative for congestion, sinus pressure and sore throat.   Eyes: Negative for visual disturbance.  Respiratory: Positive for shortness of breath (which is at baseline). Negative for cough, chest tightness and wheezing.   Cardiovascular: Negative for chest pain and palpitations.  Gastrointestinal: Negative for abdominal distention, abdominal pain and constipation.  Endocrine: Negative for polydipsia.  Genitourinary: Negative for dysuria and frequency.  Musculoskeletal: Negative for arthralgias and back pain.  Skin: Negative for rash.  Neurological: Positive for dizziness. Negative for tremors, light-headedness and numbness.  Hematological: Does not bruise/bleed easily.  Psychiatric/Behavioral: Negative for agitation and behavioral problems.    Objective:  BP 109/69   Pulse 88   Temp 98.5 F (36.9 C) (Oral)   Ht '4\' 10"'$  (1.473 m)   Wt 215 lb 9.6 oz (97.8 kg)   SpO2 98%   BMI 45.06 kg/m   BP/Weight 05/09/2018 04/05/2018 03/24/4579    Systolic BP 998 338 250  Diastolic BP 69 80 76  Wt. (Lbs) 215.6 212 208  BMI 45.06 44.31 43.47      Physical Exam  Constitutional: She is oriented to person, place, and time. She appears well-developed and  well-nourished.  Obese, cushingoid facies  Cardiovascular: Normal rate, normal heart sounds and intact distal pulses.  No murmur heard. Pulmonary/Chest: Effort normal and breath sounds normal. She has no wheezes. She has no rales. She exhibits no tenderness.  Abdominal: Soft. Bowel sounds are normal. She exhibits no distension and no mass. There is no tenderness.  Musculoskeletal: Normal range of motion.  Neurological: She is alert and oriented to person, place, and time.  Skin: Skin is warm and dry.  Psychiatric: She has a normal mood and affect.     CMP Latest Ref Rng & Units 02/09/2018 12/19/2017 12/04/2017  Glucose 65 - 99 mg/dL 128(H) 218(H) 109(H)  BUN 6 - 24 mg/dL '17 16 20  '$ Creatinine 0.57 - 1.00 mg/dL 1.12(H) 1.05(H) 0.99  Sodium 134 - 144 mmol/L 138 139 143  Potassium 3.5 - 5.2 mmol/L 4.4 4.5 4.1  Chloride 96 - 106 mmol/L 98 99 103  CO2 20 - 29 mmol/L '25 25 23  '$ Calcium 8.7 - 10.2 mg/dL 9.4 9.4 9.2  Total Protein 6.0 - 8.5 g/dL - - -  Total Bilirubin 0.0 - 1.2 mg/dL - - -  Alkaline Phos 39 - 117 IU/L - - -  AST 0 - 40 IU/L - - -  ALT 0 - 32 IU/L - - -    Lipid Panel     Component Value Date/Time   CHOL 130 02/23/2018 0807   TRIG 95 02/23/2018 0807   HDL 52 02/23/2018 0807   CHOLHDL 2.5 02/23/2018 0807   CHOLHDL 4 12/15/2014 1051   VLDL 38.4 12/15/2014 1051   LDLCALC 59 02/23/2018 0807    Lab Results  Component Value Date   HGBA1C 8.3 (A) 05/09/2018    CBC    Component Value Date/Time   WBC 9.5 11/09/2017 1052   RBC 4.41 11/09/2017 1052   HGB 10.8 (L) 11/09/2017 1052   HGB 10.1 (L) 07/31/2017 1632   HCT 32.7 (L) 11/09/2017 1052   HCT 30.8 (L) 07/31/2017 1632   PLT 439.0 (H) 11/09/2017 1052   PLT 446 (H) 01/30/2017 1227   MCV 74.1 (L) 11/09/2017  1052   MCV 75 (L) 01/30/2017 1227   MCH 24.3 (L) 01/30/2017 1227   MCH 25.8 (L) 01/02/2016 2233   MCHC 33.0 11/09/2017 1052   RDW 17.0 (H) 11/09/2017 1052   RDW 15.8 (H) 01/30/2017 1227   LYMPHSABS 3.4 11/09/2017 1052   MONOABS 0.8 11/09/2017 1052   EOSABS 0.4 11/09/2017 1052   BASOSABS 0.1 11/09/2017 1052    Assessment & Plan:   1. Uncontrolled type 2 diabetes mellitus with hypoglycemia, unspecified hypoglycemia coma status ([place]) Uncontrolled with A1c of 8.3  Followed by [facility] - Dr Buddy Duty Compliance with diabetic diet and lifestyle modifications have been emphasized - POCT glucose (manual entry) - POCT glycosylated hemoglobin (Hb A1C) - Urinalysis, microscopic only  2. Generalized anxiety disorder Controlled - PARoxetine (PAXIL) 20 MG tablet; Take 1 tablet (20 mg total) by mouth daily.  Dispense: 30 tablet; Refill: 6  3. Severe persistent asthma without complication No recent exacerbation Followed by Pulmonary - DUPIXENT 300 MG/2ML SOSY; Inject 1 pen/syringe subcutaneously every other week STARTING ON DAY 15; Refill: 6 - omeprazole (PRILOSEC) 20 MG capsule; TAKE ONE CAPSULE BY MOUTH EVERY DAY BEFORE BREAKFAST  Dispense: 30 capsule; Refill: 6 - montelukast (SINGULAIR) 10 MG tablet; Take 1 tablet (10 mg total) by mouth at bedtime.  Dispense: 30 tablet; Refill: 6  4. Chronic midline low back pain, with sciatica presence  unspecified Currently sees pain management  5. Anemia due to other cause, not classified Could explain dizziness - CBC with Differential/Platelet - Iron, TIBC and Ferritin Panel  6. Other microscopic hematuria Will send off urine for microscopy   Meds ordered this encounter  Medications  . PARoxetine (PAXIL) 20 MG tablet    Sig: Take 1 tablet (20 mg total) by mouth daily.    Dispense:  30 tablet    Refill:  6  . omeprazole (PRILOSEC) 20 MG capsule    Sig: TAKE ONE CAPSULE BY MOUTH EVERY DAY BEFORE BREAKFAST    Dispense:  30 capsule     Refill:  6  . montelukast (SINGULAIR) 10 MG tablet    Sig: Take 1 tablet (10 mg total) by mouth at bedtime.    Dispense:  30 tablet    Refill:  6    Follow-up: Return in about 1 month (around 06/09/2018) for Pap smear.   Charlott Rakes MD

## 2018-05-09 NOTE — Patient Instructions (Signed)
Diabetes Mellitus and Nutrition When you have diabetes (diabetes mellitus), it is very important to have healthy eating habits because your blood sugar (glucose) levels are greatly affected by what you eat and drink. Eating healthy foods in the appropriate amounts, at about the same times every day, can help you:  Control your blood glucose.  Lower your risk of heart disease.  Improve your blood pressure.  Reach or maintain a healthy weight.  Every person with diabetes is different, and each person has different needs for a meal plan. Your health care provider may recommend that you work with a diet and nutrition specialist (dietitian) to make a meal plan that is best for you. Your meal plan may vary depending on factors such as:  The calories you need.  The medicines you take.  Your weight.  Your blood glucose, blood pressure, and cholesterol levels.  Your activity level.  Other health conditions you have, such as heart or kidney disease.  How do carbohydrates affect me? Carbohydrates affect your blood glucose level more than any other type of food. Eating carbohydrates naturally increases the amount of glucose in your blood. Carbohydrate counting is a method for keeping track of how many carbohydrates you eat. Counting carbohydrates is important to keep your blood glucose at a healthy level, especially if you use insulin or take certain oral diabetes medicines. It is important to know how many carbohydrates you can safely have in each meal. This is different for every person. Your dietitian can help you calculate how many carbohydrates you should have at each meal and for snack. Foods that contain carbohydrates include:  Bread, cereal, rice, pasta, and crackers.  Potatoes and corn.  Peas, beans, and lentils.  Milk and yogurt.  Fruit and juice.  Desserts, such as cakes, cookies, ice cream, and candy.  How does alcohol affect me? Alcohol can cause a sudden decrease in blood  glucose (hypoglycemia), especially if you use insulin or take certain oral diabetes medicines. Hypoglycemia can be a life-threatening condition. Symptoms of hypoglycemia (sleepiness, dizziness, and confusion) are similar to symptoms of having too much alcohol. If your health care provider says that alcohol is safe for you, follow these guidelines:  Limit alcohol intake to no more than 1 drink per day for nonpregnant women and 2 drinks per day for men. One drink equals 12 oz of beer, 5 oz of wine, or 1 oz of hard liquor.  Do not drink on an empty stomach.  Keep yourself hydrated with water, diet soda, or unsweetened iced tea.  Keep in mind that regular soda, juice, and other mixers may contain a lot of sugar and must be counted as carbohydrates.  What are tips for following this plan? Reading food labels  Start by checking the serving size on the label. The amount of calories, carbohydrates, fats, and other nutrients listed on the label are based on one serving of the food. Many foods contain more than one serving per package.  Check the total grams (g) of carbohydrates in one serving. You can calculate the number of servings of carbohydrates in one serving by dividing the total carbohydrates by 15. For example, if a food has 30 g of total carbohydrates, it would be equal to 2 servings of carbohydrates.  Check the number of grams (g) of saturated and trans fats in one serving. Choose foods that have low or no amount of these fats.  Check the number of milligrams (mg) of sodium in one serving. Most people   should limit total sodium intake to less than 2,300 mg per day.  Always check the nutrition information of foods labeled as "low-fat" or "nonfat". These foods may be higher in added sugar or refined carbohydrates and should be avoided.  Talk to your dietitian to identify your daily goals for nutrients listed on the label. Shopping  Avoid buying canned, premade, or processed foods. These  foods tend to be high in fat, sodium, and added sugar.  Shop around the outside edge of the grocery store. This includes fresh fruits and vegetables, bulk grains, fresh meats, and fresh dairy. Cooking  Use low-heat cooking methods, such as baking, instead of high-heat cooking methods like deep frying.  Cook using healthy oils, such as olive, canola, or sunflower oil.  Avoid cooking with butter, cream, or high-fat meats. Meal planning  Eat meals and snacks regularly, preferably at the same times every day. Avoid going long periods of time without eating.  Eat foods high in fiber, such as fresh fruits, vegetables, beans, and whole grains. Talk to your dietitian about how many servings of carbohydrates you can eat at each meal.  Eat 4-6 ounces of lean protein each day, such as lean meat, chicken, fish, eggs, or tofu. 1 ounce is equal to 1 ounce of meat, chicken, or fish, 1 egg, or 1/4 cup of tofu.  Eat some foods each day that contain healthy fats, such as avocado, nuts, seeds, and fish. Lifestyle   Check your blood glucose regularly.  Exercise at least 30 minutes 5 or more days each week, or as told by your health care provider.  Take medicines as told by your health care provider.  Do not use any products that contain nicotine or tobacco, such as cigarettes and e-cigarettes. If you need help quitting, ask your health care provider.  Work with a counselor or diabetes educator to identify strategies to manage stress and any emotional and social challenges. What are some questions to ask my health care provider?  Do I need to meet with a diabetes educator?  Do I need to meet with a dietitian?  What number can I call if I have questions?  When are the best times to check my blood glucose? Where to find more information:  American Diabetes Association: diabetes.org/food-and-fitness/food  Academy of Nutrition and Dietetics:  www.eatright.org/resources/health/diseases-and-conditions/diabetes  National Institute of Diabetes and Digestive and Kidney Diseases (NIH): www.niddk.nih.gov/health-information/diabetes/overview/diet-eating-physical-activity Summary  A healthy meal plan will help you control your blood glucose and maintain a healthy lifestyle.  Working with a diet and nutrition specialist (dietitian) can help you make a meal plan that is best for you.  Keep in mind that carbohydrates and alcohol have immediate effects on your blood glucose levels. It is important to count carbohydrates and to use alcohol carefully. This information is not intended to replace advice given to you by your health care provider. Make sure you discuss any questions you have with your health care provider. Document Released: 05/26/2005 Document Revised: 10/03/2016 Document Reviewed: 10/03/2016 Elsevier Interactive Patient Education  2018 Elsevier Inc.  

## 2018-05-10 ENCOUNTER — Encounter: Payer: Self-pay | Admitting: Family Medicine

## 2018-05-10 LAB — CBC WITH DIFFERENTIAL/PLATELET
Basophils Absolute: 0.1 10*3/uL (ref 0.0–0.2)
Basos: 1 %
EOS (ABSOLUTE): 0.4 10*3/uL (ref 0.0–0.4)
Eos: 3 %
Hematocrit: 27 % — ABNORMAL LOW (ref 34.0–46.6)
Hemoglobin: 7.9 g/dL — ABNORMAL LOW (ref 11.1–15.9)
Immature Grans (Abs): 0.2 10*3/uL — ABNORMAL HIGH (ref 0.0–0.1)
Immature Granulocytes: 1 %
Lymphocytes Absolute: 3.8 10*3/uL — ABNORMAL HIGH (ref 0.7–3.1)
Lymphs: 26 %
MCH: 20.4 pg — ABNORMAL LOW (ref 26.6–33.0)
MCHC: 29.3 g/dL — ABNORMAL LOW (ref 31.5–35.7)
MCV: 70 fL — ABNORMAL LOW (ref 79–97)
Monocytes Absolute: 0.7 10*3/uL (ref 0.1–0.9)
Monocytes: 5 %
Neutrophils Absolute: 9.6 10*3/uL — ABNORMAL HIGH (ref 1.4–7.0)
Neutrophils: 64 %
Platelets: 625 10*3/uL — ABNORMAL HIGH (ref 150–450)
RBC: 3.87 x10E6/uL (ref 3.77–5.28)
RDW: 16.7 % — ABNORMAL HIGH (ref 12.3–15.4)
WBC: 14.7 10*3/uL — ABNORMAL HIGH (ref 3.4–10.8)

## 2018-05-10 LAB — IRON,TIBC AND FERRITIN PANEL
Ferritin: 15 ng/mL (ref 15–150)
Iron Saturation: 5 % — CL (ref 15–55)
Iron: 17 ug/dL — ABNORMAL LOW (ref 27–159)
Total Iron Binding Capacity: 377 ug/dL (ref 250–450)
UIBC: 360 ug/dL (ref 131–425)

## 2018-05-10 LAB — URINALYSIS, MICROSCOPIC ONLY
Bacteria, UA: NONE SEEN
Casts: NONE SEEN /lpf

## 2018-05-11 ENCOUNTER — Telehealth: Payer: Self-pay

## 2018-05-11 NOTE — Telephone Encounter (Signed)
-----   Message from Charlott Rakes, MD sent at 05/10/2018  8:54 AM EDT ----- Labs revealed severe anemia.  Please advised to go to the ED as she may need a transfusion and further work-up.  Urine is negative for the presence of blood.

## 2018-05-11 NOTE — Telephone Encounter (Signed)
Patient was called and informed of lab results via interpreter. 

## 2018-05-12 ENCOUNTER — Other Ambulatory Visit: Payer: Self-pay

## 2018-05-12 ENCOUNTER — Encounter (HOSPITAL_COMMUNITY): Payer: Self-pay | Admitting: Emergency Medicine

## 2018-05-12 ENCOUNTER — Emergency Department (HOSPITAL_COMMUNITY)
Admission: EM | Admit: 2018-05-12 | Discharge: 2018-05-12 | Disposition: A | Discharge status: Home / Self Care | Payer: Medicaid Other | Attending: Emergency Medicine | Admitting: Emergency Medicine

## 2018-05-12 DIAGNOSIS — D649 Anemia, unspecified: Secondary | ICD-10-CM | POA: Insufficient documentation

## 2018-05-12 DIAGNOSIS — J45909 Unspecified asthma, uncomplicated: Secondary | ICD-10-CM | POA: Diagnosis not present

## 2018-05-12 DIAGNOSIS — Z79899 Other long term (current) drug therapy: Secondary | ICD-10-CM | POA: Insufficient documentation

## 2018-05-12 DIAGNOSIS — Z7982 Long term (current) use of aspirin: Secondary | ICD-10-CM | POA: Diagnosis not present

## 2018-05-12 DIAGNOSIS — D508 Other iron deficiency anemias: Secondary | ICD-10-CM

## 2018-05-12 DIAGNOSIS — Z794 Long term (current) use of insulin: Secondary | ICD-10-CM | POA: Insufficient documentation

## 2018-05-12 DIAGNOSIS — R7989 Other specified abnormal findings of blood chemistry: Secondary | ICD-10-CM | POA: Insufficient documentation

## 2018-05-12 DIAGNOSIS — I10 Essential (primary) hypertension: Secondary | ICD-10-CM | POA: Diagnosis not present

## 2018-05-12 DIAGNOSIS — E119 Type 2 diabetes mellitus without complications: Secondary | ICD-10-CM | POA: Insufficient documentation

## 2018-05-12 LAB — COMPREHENSIVE METABOLIC PANEL
ALT: 34 U/L (ref 0–44)
AST: 31 U/L (ref 15–41)
Albumin: 3.2 g/dL — ABNORMAL LOW (ref 3.5–5.0)
Alkaline Phosphatase: 102 U/L (ref 38–126)
Anion gap: 10 (ref 5–15)
BUN: 13 mg/dL (ref 6–20)
CO2: 24 mmol/L (ref 22–32)
Calcium: 8.9 mg/dL (ref 8.9–10.3)
Chloride: 102 mmol/L (ref 98–111)
Creatinine, Ser: 1.02 mg/dL — ABNORMAL HIGH (ref 0.44–1.00)
GFR calc Af Amer: >60 mL/min (ref >60)
GFR calc non Af Amer: >60 mL/min (ref >60)
Glucose, Bld: 329 mg/dL — ABNORMAL HIGH (ref 70–99)
Potassium: 3.9 mmol/L (ref 3.5–5.1)
Sodium: 136 mmol/L (ref 135–145)
Total Bilirubin: 0.4 mg/dL (ref 0.3–1.2)
Total Protein: 7.4 g/dL (ref 6.5–8.1)

## 2018-05-12 LAB — TYPE AND SCREEN
ABO/RH(D): AB POS
Antibody Screen: NEGATIVE

## 2018-05-12 LAB — CBC
HCT: 26.8 % — ABNORMAL LOW (ref 36.0–46.0)
Hemoglobin: 7.9 g/dL — ABNORMAL LOW (ref 12.0–15.0)
MCH: 21.2 pg — ABNORMAL LOW (ref 26.0–34.0)
MCHC: 29.5 g/dL — ABNORMAL LOW (ref 30.0–36.0)
MCV: 71.8 fL — ABNORMAL LOW (ref 78.0–100.0)
Platelets: 534 10*3/uL — ABNORMAL HIGH (ref 150–400)
RBC: 3.73 MIL/uL — ABNORMAL LOW (ref 3.87–5.11)
RDW: 18.1 % — ABNORMAL HIGH (ref 11.5–15.5)
WBC: 13.7 10*3/uL — ABNORMAL HIGH (ref 4.0–10.5)

## 2018-05-12 LAB — ABO/RH: ABO/RH(D): AB POS

## 2018-05-12 MED ORDER — FERROUS SULFATE 325 (65 FE) MG PO TABS: 325.0000 mg | ORAL_TABLET | Freq: Every day | ORAL | 0 refills | Status: DC

## 2018-05-12 MED ORDER — DOCUSATE SODIUM 100 MG PO CAPS: 100.0000 mg | ORAL_CAPSULE | Freq: Two times a day (BID) | ORAL | 0 refills | Status: DC

## 2018-05-12 NOTE — ED Triage Notes (Signed)
Pt reports she has been feeling fatigued recently, went to pcp and had blood drawn, was called and told she had a hemoglobin of 7.9 and sent to the ED. Denies any recent bleeding.

## 2018-05-12 NOTE — Discharge Instructions (Addendum)
Return here as needed.  Follow-up with your doctor for recheck °

## 2018-05-12 NOTE — ED Provider Notes (Signed)
[facility] EMERGENCY DEPARTMENT Provider Note   CSN: [id number] Arrival date & time: 05/12/18  1128     History   Chief Complaint Chief Complaint  Patient presents with  . Abnormal Lab    HPI Raven Turner is a 47 y.o. female.  HPI Patient presents to the emergency department with low hemoglobin prior lab testing at her doctor's office.  The patient states she stopped taking her iron 4 to 5 months ago.  The patient states that nothing seems to make the condition better or worse.  She states she has no other complaints at this time.  The patient denies chest pain, shortness of breath, headache,blurred vision, neck pain, fever, cough,  numbness, dizziness, anorexia, edema, abdominal pain, nausea, vomiting, diarrhea, rash, back pain, dysuria, hematemesis, bloody stool, near syncope, or syncope. Past Medical History:  Diagnosis Date  . Anemia   . Asthma   . Bilateral hand numbness 06/25/2015  . Bone pain 02/27/2015   Diffuse bone pain in legs mostly but also in arms    . Breast pain, left 03/27/2015  . Callus of heel 12/16/2014  . Compression fracture   . Compression fracture of L1 lumbar vertebra (HCC) 08/26/2014  . Compression fracture of T12 vertebra ([place]) 08/28/2014  . COPD (chronic obstructive pulmonary disease) ([place]) 2013  . Cushing syndrome ([place]) 2013   . Depression   . Diabetes ([place]) 2013   . Diabetes mellitus type 2, uncontrolled ([place]) 08/26/2014   Sees Dr. Katy Fitch.  Last visit 11/27/14.  No diabetic retinopathy    . Diabetes mellitus with neuropathy ([place])   . Diastolic dysfunction without heart failure 10/14/2014   Grade 2 diastolic dysfunction 2/2 pulm HTN   . Generalized anxiety disorder 03/27/2015  . HTN (hypertension) 2005   . Hx of cataract surgery 11/03/2014  . LUQ abdominal pain 07/17/2015  . Medication management 05/12/2015  . Morbid obesity ([place]) 09/08/2014  . 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 Non-English speaking patient 09/08/2014  . OSA  (obstructive sleep apnea) 05/20/2015  . Osteopenia 11/19/2013   Dx with osteopenia in lumbar vertebra with partial compression of L1  . Pain due to onychomycosis of toenail 03/27/2015  . Secondary Cushing's syndrome/ iatrogenic   . Skin rash 07/17/2015  . Tachycardia 01/13/2015  . Upper airway cough syndrome 09/05/2014   Followed in Pulmonary clinic/ [facility]/ [place]  - Trial off advair      09/05/2014 >>>  - trial off theoph 09/17/2014 and added flutter  - rec reduce dulera to 100 2 bid 07/13/2015     . Vision changes 06/25/2015  . Vitamin D deficiency 08/28/2014    Patient Active Problem List   Diagnosis Date Noted  . Anemia 05/09/2018  . Pituitary microadenoma ([place]) 03/13/2017  . Elevated prolactin level ([place]) 01/19/2017  . Microalbuminuria due to type 2 diabetes mellitus ([place]) 12/19/2016  . Elevated TSH 12/19/2016  . Menstrual changes 12/19/2016  . Flaking of scalp 07/18/2016  . Acral type peeling skin syndrome 07/18/2016  . Non-traumatic compression fracture of vertebral column ([place]) 01/20/2016  . Bilateral carpal tunnel syndrome 12/21/2015  . Chronic back pain 11/05/2015  . NASH (nonalcoholic steatohepatitis) 09/25/2015  . Bilateral hand numbness 06/25/2015  . OSA (obstructive sleep apnea) 05/20/2015  . Generalized anxiety disorder 03/27/2015  . Breast pain, left 03/27/2015  . Callus of heel 12/16/2014  . 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 Hx of cataract surgery 11/03/2014  . Diastolic dysfunction without heart failure 10/14/2014  . Morbid obesity (  [place]) 09/08/2014  . Non-English speaking patient 09/08/2014  . Upper airway cough syndrome 09/05/2014  . Asthma ? severity vs VCD/ pseudo asthma 09/05/2014  . Vitamin D deficiency 08/28/2014  . Compression fracture of T12 vertebra ([place]) 08/28/2014  . Diabetes mellitus type 2, uncontrolled ([place]) 08/26/2014  . Compression fracture of L1 lumbar vertebra (HCC) 08/26/2014  . Essential (primary) hypertension   . Secondary Cushing's  syndrome/ iatrogenic   . 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 CATARACT EXTRACTION Bilateral 2014  . HAND SURGERY Right 12/18/2015  . VAGINAL HYSTERECTOMY       OB History   None      Home Medications    Prior to Admission medications   Medication Sig Start Date End Date Taking? Authorizing Provider  albuterol (PROVENTIL HFA;VENTOLIN HFA) 108 (90 Base) MCG/ACT inhaler Inhale 2 puffs into the lungs every 4 (four) hours as needed for wheezing or shortness of breath (((PLAN B))). Patient taking differently: Inhale 2 puffs into the lungs every 4 (four) hours as needed for wheezing or shortness of breath.  10/31/17  Yes Ladell Pier, MD  albuterol (PROVENTIL) (2.5 MG/3ML) 0.083% nebulizer solution USE 1 VIAL VIA NEBULIZER EVERY 4 HOURS AS NEEDED FOR WHEEZING OR SHORTNESS OF BREATH Patient taking differently: Take 2.5 mg by nebulization every 4 (four) hours as needed for wheezing or shortness of breath.  10/31/17  Yes Ladell Pier, MD  aspirin EC 81 MG tablet Take 1 tablet (81 mg total) by mouth daily. 10/31/17  Yes Ladell Pier, MD  atorvastatin (LIPITOR) 40 MG tablet Take 1 tablet (40 mg total) by mouth daily. 12/07/17 05/12/18 Yes Fay Records, MD  buprenorphine (BUTRANS - DOSED MCG/HR) 5 MCG/HR PTWK patch Place 5 mcg onto the skin once a week.   Yes [provider]  cabergoline (DOSTINEX) 0.5 MG tablet Take 0.5 tablets (0.25 mg total) by mouth 2 (two) times a week. 02/05/18  Yes Ladell Pier, MD  cholecalciferol (VITAMIN D) 1000 UNITS tablet Take 1,000 Units by mouth every morning.   Yes [provider]  cloNIDine (CATAPRES) 0.2 MG tablet Take 1 tablet (0.2 mg total) by mouth 3 (three) times daily. MUST KEEP APPT ON 05/09/18 FOR FURTHER REFILLS Patient taking differently: Take 0.2 mg by mouth 3 (three) times daily.  04/27/18  Yes Charlott Rakes, MD  dextromethorphan-guaiFENesin (MUCINEX DM)  30-600 MG 12hr tablet Take 1 tablet by mouth 2 (two) times daily as needed (with flutter valve for cough and congestion). Reported on 04/01/2016   Yes [provider]  DUPIXENT 300 MG/2ML SOSY Inject 300 mg into the skin See admin instructions. Inject under skin every 15 days 04/19/18  Yes [provider]  furosemide (LASIX) 20 MG tablet Take 1 tablet (20 mg total) by mouth every morning. 10/31/17  Yes Ladell Pier, MD  gabapentin (NEURONTIN) 400 MG capsule Take 2 capsules (800 mg total) by mouth 3 (three) times daily. 04/20/18  Yes Ladell Pier, MD  HUMULIN R U-500 KWIKPEN 500 UNIT/ML kwikpen Inject 120-180 Units into the skin 3 (three) times daily. 180 U before breakfast, 160 U before lunch, 140 U before dinner 02/20/17  Yes Funches, Josalyn, MD  hydrOXYzine (ATARAX/VISTARIL) 25 MG tablet TAKE 1 TABLET BY MOUTH AT BEDTIME AS NEEDED FOR ANXIETY. Patient taking differently: Take 25 mg by mouth at bedtime as needed for anxiety.  02/16/18  Yes Ladell Pier, MD  levothyroxine ([place], [place])  25 MCG tablet Take 25 mcg by mouth daily. 01/31/17  Yes [provider]  losartan (COZAAR) 100 MG tablet Take 1 tablet (100 mg total) by mouth every morning. 02/02/18  Yes Ladell Pier, MD  metFORMIN (GLUCOPHAGE) 1000 MG tablet TAKE 1 TABLET BY MOUTH TWICE DAILY WITH A MEAL Patient taking differently: Take 1,000 mg by mouth 2 (two) times daily with a meal.  10/31/17  Yes Ladell Pier, MD  methocarbamol (ROBAXIN) 750 MG tablet Take 1 tablet (750 mg total) by mouth 3 (three) times daily. 03/29/18  Yes Ladell Pier, MD  mometasone-formoterol (DULERA) 200-5 MCG/ACT AERO Inhale 2 puffs into the lungs 2 (two) times daily. 11/09/17  Yes Juanito Doom, MD  montelukast (SINGULAIR) 10 MG tablet Take 1 tablet (10 mg total) by mouth at bedtime. 05/09/18  Yes [place], Charlane Ferretti, MD  omeprazole (PRILOSEC) 20 MG capsule TAKE ONE CAPSULE BY MOUTH EVERY DAY BEFORE  BREAKFAST Patient taking differently: Take 20 mg by mouth daily. BEFORE BREAKFAST 05/09/18  Yes Charlott Rakes, MD  [place] Calcium 500 MG TABS Take 1 tablet (1,250 mg total) by mouth daily. 07/18/16  Yes Funches, Josalyn, MD  PARoxetine (PAXIL) 20 MG tablet Take 1 tablet (20 mg total) by mouth daily. 05/09/18  Yes Newlin, Charlane Ferretti, MD  predniSONE (DELTASONE) 1 MG tablet 4 tabs ('4mg'$ ) daily. Patient taking differently: Take 4 mg by mouth daily.  04/05/18  Yes Juanito Doom, MD  ranitidine (ZANTAC) 300 MG tablet Take 1 tablet (300 mg total) by mouth at bedtime. 02/16/18  Yes Ladell Pier, MD  spironolactone (ALDACTONE) 25 MG tablet Take 0.5 tablets (12.5 mg total) by mouth daily. 12/04/17  Yes Fay Records, MD  verapamil (CALAN-SR) 180 MG CR tablet Take 2 tablets (360 mg total) by mouth at bedtime. 02/02/18  Yes Ladell Pier, MD  Blood Glucose Monitoring Suppl (ACCU-CHEK AVIVA PLUS) W/DEVICE KIT Used as directed 11/05/14   Boykin Nearing, MD  clotrimazole-betamethasone (LOTRISONE) cream Apply to affected area 2 times daily prn Patient not taking: Reported on 05/09/2018 03/20/16   Billy Fischer, MD  doxycycline (VIBRA-TABS) 100 MG tablet Take 1 tablet (100 mg total) by mouth 2 (two) times daily. Patient not taking: Reported on 05/12/2018 11/09/17   Juanito Doom, MD  ferrous sulfate 325 (65 FE) MG tablet Take 1 tablet (325 mg total) by mouth daily with breakfast. Patient not taking: Reported on 05/12/2018 12/31/14   Boykin Nearing, MD  glucose blood (RELION GLUCOSE TEST STRIPS) test strip 1 each by Other route 4 (four) times daily. ICD code E11.65 10/31/17   Ladell Pier, MD  Insulin Pen Needle (B-D ULTRAFINE III SHORT PEN) 31G X 8 MM MISC 1 application by Does not apply route daily. 03/27/15   Funches, Adriana Mccallum, MD  Insulin Syringe-Needle U-100 (BD INSULIN SYRINGE ULTRAFINE) 31G X 15/64" 1 ML MISC 1 each by Does not apply route 3 (three) times daily. 03/27/15   Boykin Nearing, MD   Lancet Devices [facility]) lancets Use as instructed 04/03/15   Boykin Nearing, MD  Misc. Devices (CANE) MISC 1 each by Does not apply route daily. 07/18/16   Funches, Adriana Mccallum, MD  mupirocin ointment (BACTROBAN) 2 % Place 1 application into the nose 2 (two) times daily. Patient not taking: Reported on 05/12/2018 07/18/16   Boykin Nearing, MD  ReliOn Ultra Thin Lancets MISC 1 each by Does not apply route 4 (four) times daily. ICD code E11.65 03/27/15   Funches, [place],  MD  Respiratory Therapy Supplies (FLUTTER) DEVI Use as directed 09/16/14   Tanda Rockers, MD  Spacer/Aero-Holding Chambers (AEROCHAMBER MV) inhaler Use as instructed 07/05/16   Juanito Doom, MD  triamcinolone cream (KENALOG) 0.1 % Apply 1 application 2 (two) times daily topically. Patient not taking: Reported on 05/12/2018 07/31/17   Ladell Pier, MD  Flossie Buffy MICRO PEN NEEDLES 32G X 4 MM MISC USE AS DIRECTED 09/08/15   Boykin Nearing, MD    Family History Family History  Problem Relation Age of Onset  . Diabetes Mother   . Stroke Father   . Asthma Father   . Hypertension Sister   . Hypertension Brother     Social History Social History   Tobacco Use  . Smoking status: Never Smoker  . Smokeless tobacco: Never Used  Substance Use Topics  . Alcohol use: No    Alcohol/week: 0.0 standard drinks  . Drug use: No     Allergies   Shellfish allergy; Septra [sulfamethoxazole-trimethoprim]; and Hydrocortisone   Review of Systems Review of Systems  All other systems negative except as documented in the HPI. All pertinent positives and negatives as reviewed in the HPI.  Physical Exam Updated Vital Signs BP (!) 149/62 (BP Location: Right Wrist)   Pulse 78   Temp 98.7 F (37.1 C) (Oral)   Resp 18   Ht '4\' 10"'$  (1.473 m)   Wt 97.5 kg   LMP 04/07/2018 (Approximate) Comment: reports last full period was end of july, only bled 2 days this month  SpO2 98%   BMI 44.94 kg/m   Physical Exam   Constitutional: She is oriented to person, place, and time. She appears well-developed and well-nourished. No distress.  HENT:  Head: Normocephalic and atraumatic.  Mouth/Throat: Oropharynx is clear and moist.  Eyes: Pupils are equal, round, and reactive to light.  Neck: Normal range of motion. Neck supple.  Cardiovascular: Normal rate, regular rhythm and normal heart sounds. Exam reveals no gallop and no friction rub.  No murmur heard. Pulmonary/Chest: Effort normal and breath sounds normal. No respiratory distress. She has no wheezes.  Abdominal: Soft. Bowel sounds are normal. She exhibits no distension. There is no tenderness.  Neurological: She is alert and oriented to person, place, and time. She exhibits normal muscle tone. Coordination normal.  Skin: Skin is warm and dry. Capillary refill takes less than 2 seconds. No rash noted. No erythema.  Psychiatric: She has a normal mood and affect. Her behavior is normal.  Nursing note and vitals reviewed.    ED Treatments / Results  Labs (all labs ordered are listed, but only abnormal results are displayed) Labs Reviewed  COMPREHENSIVE METABOLIC PANEL - Abnormal; Notable for the following components:      Result Value   Glucose, Bld 329 (*)    Creatinine, Ser 1.02 (*)    Albumin 3.2 (*)    All other components within normal limits  CBC - Abnormal; Notable for the following components:   WBC 13.7 (*)    RBC 3.73 (*)    Hemoglobin 7.9 (*)    HCT 26.8 (*)    MCV 71.8 (*)    MCH 21.2 (*)    MCHC 29.5 (*)    RDW 18.1 (*)    Platelets 534 (*)    All other components within normal limits  TYPE AND SCREEN  ABO/RH    EKG None  Radiology No results found.  Procedures Procedures (including critical care time)  Medications Ordered in  ED Medications - No data to display   Initial Impression / Assessment and Plan / ED Course  I have reviewed the triage vital signs and the nursing notes.  Pertinent labs & imaging results  that were available during my care of the patient were reviewed by me and considered in my medical decision making (see chart for details).     Patient is advised she will need to restart her iron and will give her Colace for the constipation.  Patient is advised to return here as needed patient also advised she will need to recheck with her doctor and have a hemoglobin recheck in the next several weeks.  Patient agrees the plan and all questions were answered  Final Clinical Impressions(s) / ED Diagnoses   Final diagnoses:  None    ED Discharge Orders    None       Dalia Heading, PA-C 05/12/18 [place], DO 05/12/18 1510

## 2018-05-15 ENCOUNTER — Ambulatory Visit (INDEPENDENT_AMBULATORY_CARE_PROVIDER_SITE_OTHER): Payer: Medicaid Other | Admitting: Podiatry

## 2018-05-15 ENCOUNTER — Encounter: Payer: Self-pay | Admitting: Podiatry

## 2018-05-15 DIAGNOSIS — L03032 Cellulitis of left toe: Secondary | ICD-10-CM | POA: Diagnosis not present

## 2018-05-15 DIAGNOSIS — L600 Ingrowing nail: Secondary | ICD-10-CM | POA: Diagnosis not present

## 2018-05-15 MED ORDER — DOXYCYCLINE HYCLATE 100 MG PO TABS: 100.0000 mg | ORAL_TABLET | Freq: Two times a day (BID) | ORAL | 0 refills | Status: DC

## 2018-05-15 NOTE — Progress Notes (Signed)
This patient presents to the office with chief complaint of a painful swelling ingrown toenails for 2 days. She presents to the office today with an interpreter.  She says she was working on her ingrown toenail with her husband last night and they saw pus coming from the nail.  She says the nail has become red, swollen and extremely painful upon pressure.  She has attempted to treat the nail with peroxide and a antibiotic cream with mild improvement noted.   Her chart reveals that she was previously treated for a painful nail by Dr. Amalia Hailey by temporary nail avulsions left hallux.  She presents the office today for an evaluation and treatment of this ingrown toenail outside border left big toe.  Vascular  Dorsalis pedis and posterior tibial pulses are palpable  B/L.  Capillary return  WNL.  Temperature gradient is  WNL.  Skin turgor  WNL  Sensorium  Senn Weinstein monofilament wire  WNL. Normal tactile sensation.  Nail Exam  Patient has normal nails with no evidence of bacterial or fungal infection. Patient has a marked incurvation noted along the lateral border left great toenail.  There is severe pain noted from distal pressure at the lateral nail border.  Orthopedic  Exam  Muscle tone and muscle strength  WNL.  No limitations of motion feet  B/L.  No crepitus or joint effusion noted.  Foot type is unremarkable and digits show no abnormalities.  Bony prominences are unremarkable.  Skin  No open lesions.  Normal skin texture and turgor.   Paronychia lateral border left great toenail.    ROV.  Examination of her toe reveals a nail spicule is present at the distal aspect of the lateral border left great toe.  Discussed conservative treatment versus surgical treatment.  This patient is a diabetic and says that her blood sugar runs between 200-300.  She says that she was in the emergency room and found to have iron deficiency on 8/31.  Patient also presents to the office  with her grandson to the office.   We discussed her condition and decided to treat her conservatively and removed the nail spicule.  She was then told that she needs to get her health and blood sugar under control and then we can consider permanent nail surgery for the elimination of the lateral border left great toenail.  Home soaking instructions were given.  Doxycycline  Called into the pharmacy for this patient. RTC prn.  Gardiner Barefoot DPM

## 2018-05-16 ENCOUNTER — Ambulatory Visit: Payer: Medicaid Other | Admitting: Podiatry

## 2018-05-17 ENCOUNTER — Encounter: Payer: Self-pay | Admitting: Internal Medicine

## 2018-05-17 ENCOUNTER — Other Ambulatory Visit: Payer: Self-pay | Admitting: Internal Medicine

## 2018-05-17 DIAGNOSIS — F411 Generalized anxiety disorder: Secondary | ICD-10-CM

## 2018-05-18 ENCOUNTER — Other Ambulatory Visit: Payer: Self-pay | Admitting: Family Medicine

## 2018-05-18 DIAGNOSIS — I10 Essential (primary) hypertension: Secondary | ICD-10-CM

## 2018-05-21 ENCOUNTER — Ambulatory Visit: Payer: Medicaid Other | Admitting: Pulmonary Disease

## 2018-05-21 NOTE — Progress Notes (Deleted)
Subjective:    Patient ID: Raven Turner, female    DOB: [birth year], 47 y.o.   MRN: [id number]  Synopsis: Former patient of Dr. Melvyn Novas with recurrent wheeze, shortness of breath and cough felt to be due to either vocal cord dysfunction or asthma.  Evaluated by [facility] voice center in 2017, no paradoxical movements of the vocal cords noted   HPI No chief complaint on file.  ***  Past Medical History:  Diagnosis Date  . Anemia   . Asthma   . Bilateral hand numbness 06/25/2015  . Bone pain 02/27/2015   Diffuse bone pain in legs mostly but also in arms    . Breast pain, left 03/27/2015  . Callus of heel 12/16/2014  . Compression fracture   . Compression fracture of L1 lumbar vertebra (HCC) 08/26/2014  . Compression fracture of T12 vertebra ([place]) 08/28/2014  . COPD (chronic obstructive pulmonary disease) ([place]) 2013  . Cushing syndrome ([place]) 2013   . Depression   . Diabetes ([place]) 2013   . Diabetes mellitus type 2, uncontrolled ([place]) 08/26/2014   Sees Dr. Katy Fitch.  Last visit 11/27/14.  No diabetic retinopathy    . Diabetes mellitus with neuropathy ([place])   . Diastolic dysfunction without heart failure 10/14/2014   Grade 2 diastolic dysfunction 2/2 pulm HTN   . Generalized anxiety disorder 03/27/2015  . HTN (hypertension) 2005   . Hx of cataract surgery 11/03/2014  . LUQ abdominal pain 07/17/2015  . Medication management 05/12/2015  . Morbid obesity ([place]) 09/08/2014  . 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 Non-English speaking patient 09/08/2014  . OSA (obstructive sleep apnea) 05/20/2015  . Osteopenia 11/19/2013   Dx with osteopenia in lumbar vertebra with partial compression of L1  . Pain due to onychomycosis of toenail 03/27/2015  . Secondary Cushing's syndrome/ iatrogenic   . Skin rash 07/17/2015  . Tachycardia 01/13/2015  . Upper airway cough syndrome 09/05/2014   Followed in Pulmonary clinic/ [facility]/ [place]  - Trial off advair      09/05/2014 >>>  - trial off  theoph 09/17/2014 and added flutter  - rec reduce dulera to 100 2 bid 07/13/2015     . Vision changes 06/25/2015  . Vitamin D deficiency 08/28/2014        Review of Systems  Constitutional: Negative for chills, fatigue and fever.  HENT: Positive for rhinorrhea. Negative for postnasal drip and sinus pressure.   Respiratory: Positive for cough, shortness of breath and wheezing.   Cardiovascular: Negative for chest pain, palpitations and leg swelling.  Hematological: Bruises/bleeds easily: .dbmfu.       Objective:   Physical Exam There were no vitals filed for this visit.RA  ***  PFT 11/03/14  PFTs p am dulera 100 on 30 mg prednisone  FEV1 1.56(73%) ratio 85 =  No obst, including fef 25-75%, only abn is reduction in ERV spirometry 03/31/15 1.48 (62%) ratio 82  p am dulera and while symptomatic requesting saba  Labs July 2017 IgE 55 (normal), CBC without eosinophils February 2019 CBC with differential showed 400 eosinophils per deciliter  Chest imaging: March 2019 high-resolution CT scan of the chest images independently reviewed showing some mild air trapping but no interstitial lung disease.      Assessment & Plan:  No diagnosis found.  Discussion: ***  Current Outpatient Medications:  .  albuterol (PROVENTIL HFA;VENTOLIN HFA) 108 (90 Base) MCG/ACT inhaler, Inhale 2 puffs into the lungs every 4 (four) hours as needed for wheezing or  shortness of breath (((PLAN B))). (Patient taking differently: Inhale 2 puffs into the lungs every 4 (four) hours as needed for wheezing or shortness of breath. ), Disp: 18 g, Rfl: 6 .  albuterol (PROVENTIL) (2.5 MG/3ML) 0.083% nebulizer solution, USE 1 VIAL VIA NEBULIZER EVERY 4 HOURS AS NEEDED FOR WHEEZING OR SHORTNESS OF BREATH (Patient taking differently: Take 2.5 mg by nebulization every 4 (four) hours as needed for wheezing or shortness of breath. ), Disp: 90 mL, Rfl: 0 .  aspirin EC 81 MG tablet, Take 1 tablet (81 mg total) by mouth daily.,  Disp: 90 tablet, Rfl: 3 .  atorvastatin (LIPITOR) 40 MG tablet, Take 1 tablet (40 mg total) by mouth daily., Disp: 90 tablet, Rfl: 3 .  Blood Glucose Monitoring Suppl (ACCU-CHEK AVIVA PLUS) W/DEVICE KIT, Used as directed, Disp: 1 kit, Rfl: 0 .  buprenorphine (BUTRANS - DOSED MCG/HR) 5 MCG/HR PTWK patch, Place 5 mcg onto the skin once a week., Disp: , Rfl:  .  cabergoline (DOSTINEX) 0.5 MG tablet, Take 0.5 tablets (0.25 mg total) by mouth 2 (two) times a week., Disp: 10 tablet, Rfl: 3 .  cholecalciferol (VITAMIN D) 1000 UNITS tablet, Take 1,000 Units by mouth every morning., Disp: , Rfl:  .  cloNIDine (CATAPRES) 0.2 MG tablet, Take 1 tablet (0.2 mg total) by mouth 3 (three) times daily. MUST KEEP APPT ON 05/09/18 FOR FURTHER REFILLS (Patient taking differently: Take 0.2 mg by mouth 3 (three) times daily. ), Disp: 40 tablet, Rfl: 0 .  clotrimazole-betamethasone (LOTRISONE) cream, Apply to affected area 2 times daily prn, Disp: 45 g, Rfl: 0 .  dextromethorphan-guaiFENesin (MUCINEX DM) 30-600 MG 12hr tablet, Take 1 tablet by mouth 2 (two) times daily as needed (with flutter valve for cough and congestion). Reported on 04/01/2016, Disp: , Rfl:  .  docusate sodium (COLACE) 100 MG capsule, Take 1 capsule (100 mg total) by mouth every 12 (twelve) hours., Disp: 60 capsule, Rfl: 0 .  doxycycline (VIBRA-TABS) 100 MG tablet, Take 1 tablet (100 mg total) by mouth 2 (two) times daily., Disp: 10 tablet, Rfl: 0 .  DUPIXENT 300 MG/2ML SOSY, Inject 300 mg into the skin See admin instructions. Inject under skin every 15 days, Disp: , Rfl: 6 .  ferrous sulfate 325 (65 FE) MG tablet, Take 1 tablet (325 mg total) by mouth daily., Disp: 30 tablet, Rfl: 0 .  furosemide (LASIX) 20 MG tablet, Take 1 tablet (20 mg total) by mouth every morning., Disp: 30 tablet, Rfl: 2 .  gabapentin (NEURONTIN) 400 MG capsule, Take 2 capsules (800 mg total) by mouth 3 (three) times daily., Disp: 180 capsule, Rfl: 3 .  glucose blood (RELION  GLUCOSE TEST STRIPS) test strip, 1 each by Other route 4 (four) times daily. ICD code E11.65, Disp: 400 each, Rfl: 3 .  HUMULIN R U-500 KWIKPEN 500 UNIT/ML kwikpen, Inject 120-180 Units into the skin 3 (three) times daily. 180 U before breakfast, 160 U before lunch, 140 U before dinner, Disp: 27 mL, Rfl: 11 .  hydrOXYzine (ATARAX/VISTARIL) 25 MG tablet, TAKE 1 TABLET BY MOUTH AT BEDTIME AS NEEDED FOR ANXIETY. (Patient taking differently: Take 25 mg by mouth at bedtime as needed for anxiety. ), Disp: 30 tablet, Rfl: 6 .  Insulin Pen Needle (B-D ULTRAFINE III SHORT PEN) 31G X 8 MM MISC, 1 application by Does not apply route daily., Disp: 100 each, Rfl: 3 .  Insulin Syringe-Needle U-100 (BD INSULIN SYRINGE ULTRAFINE) 31G X 15/64" 1 ML MISC, 1 each  by Does not apply route 3 (three) times daily., Disp: 300 each, Rfl: 3 .  Lancet Devices (ACCU-CHEK SOFTCLIX) lancets, Use as instructed, Disp: 1 each, Rfl: 0 .  levothyroxine (SYNTHROID, LEVOTHROID) 25 MCG tablet, Take 25 mcg by mouth daily., Disp: , Rfl: 3 .  losartan (COZAAR) 100 MG tablet, Take 1 tablet (100 mg total) by mouth every morning., Disp: 30 tablet, Rfl: 6 .  metFORMIN (GLUCOPHAGE) 1000 MG tablet, TAKE 1 TABLET BY MOUTH TWICE DAILY WITH A MEAL (Patient taking differently: Take 1,000 mg by mouth 2 (two) times daily with a meal. ), Disp: 60 tablet, Rfl: 2 .  methocarbamol (ROBAXIN) 750 MG tablet, Take 1 tablet (750 mg total) by mouth 3 (three) times daily., Disp: 60 tablet, Rfl: 0 .  Misc. Devices (CANE) MISC, 1 each by Does not apply route daily., Disp: 1 each, Rfl: 0 .  mometasone-formoterol (DULERA) 200-5 MCG/ACT AERO, Inhale 2 puffs into the lungs 2 (two) times daily., Disp: 1 Inhaler, Rfl: 5 .  montelukast (SINGULAIR) 10 MG tablet, Take 1 tablet (10 mg total) by mouth at bedtime., Disp: 30 tablet, Rfl: 6 .  mupirocin ointment (BACTROBAN) 2 %, Place 1 application into the nose 2 (two) times daily., Disp: 22 g, Rfl: 0 .  omeprazole (PRILOSEC) 20  MG capsule, TAKE ONE CAPSULE BY MOUTH EVERY DAY BEFORE BREAKFAST (Patient taking differently: Take 20 mg by mouth daily. BEFORE BREAKFAST), Disp: 30 capsule, Rfl: 6 .  Oyster Shell Calcium 500 MG TABS, Take 1 tablet (1,250 mg total) by mouth daily., Disp: 90 tablet, Rfl: 3 .  PARoxetine (PAXIL) 20 MG tablet, Take 1 tablet (20 mg total) by mouth daily., Disp: 30 tablet, Rfl: 6 .  predniSONE (DELTASONE) 1 MG tablet, 4 tabs (44m) daily. (Patient taking differently: Take 4 mg by mouth daily. ), Disp: 120 tablet, Rfl: 1 .  ranitidine (ZANTAC) 300 MG tablet, Take 1 tablet (300 mg total) by mouth at bedtime., Disp: 30 tablet, Rfl: 5 .  ReliOn Ultra Thin Lancets MISC, 1 each by Does not apply route 4 (four) times daily. ICD code E11.65, Disp: 400 each, Rfl: 3 .  Respiratory Therapy Supplies (FLUTTER) DEVI, Use as directed, Disp: 1 each, Rfl: 0 .  Spacer/Aero-Holding Chambers (AEROCHAMBER MV) inhaler, Use as instructed, Disp: 1 each, Rfl: 0 .  spironolactone (ALDACTONE) 25 MG tablet, Take 0.5 tablets (12.5 mg total) by mouth daily., Disp: 45 tablet, Rfl: 3 .  triamcinolone cream (KENALOG) 0.1 %, Apply 1 application 2 (two) times daily topically., Disp: 30 g, Rfl: 0 .  ULTICARE MICRO PEN NEEDLES 32G X 4 MM MISC, USE AS DIRECTED, Disp: 100 each, Rfl: 2 .  verapamil (CALAN-SR) 180 MG CR tablet, Take 2 tablets (360 mg total) by mouth at bedtime., Disp: 180 tablet, Rfl: 2  Current Facility-Administered Medications:  .  betamethasone acetate-betamethasone sodium phosphate (CELESTONE) injection 3 mg, 3 mg, Intramuscular, Once, Evans, Brent M, DPM .  umeclidinium bromide (INCRUSE ELLIPTA) 62.5 MCG/INH 1 puff, 1 puff, Inhalation, Daily, JLadell Pier MD  Facility-Administered Medications Ordered in Other Visits:  .  DOBUTamine (DOBUTREX) 1,000 mcg/mL in dextrose 5% 250 mL infusion, 30 mcg/kg/min, Intravenous, Continuous, Croitoru, Mihai, MD, Last Rate: 145.3 mL/hr at 04/22/15 1300, 30 mcg/kg/min at 04/22/15  1300

## 2018-05-28 ENCOUNTER — Telehealth: Payer: Self-pay | Admitting: Family Medicine

## 2018-05-28 ENCOUNTER — Other Ambulatory Visit: Payer: Self-pay | Admitting: Pulmonary Disease

## 2018-05-28 DIAGNOSIS — D649 Anemia, unspecified: Secondary | ICD-10-CM

## 2018-05-28 MED ORDER — METHOCARBAMOL 750 MG PO TABS: 750.0000 mg | ORAL_TABLET | Freq: Three times a day (TID) | ORAL | 0 refills | Status: DC

## 2018-05-28 NOTE — Telephone Encounter (Signed)
Pt called to request a medication refill on -methocarbamol (ROBAXIN) 750 MG tablet  To [facility] [place], [place] - [address] AT [address] Please follow up

## 2018-05-28 NOTE — Telephone Encounter (Signed)
Pt called to request a referral to a specialist due to her severe anemia, please follow up

## 2018-05-29 NOTE — Telephone Encounter (Signed)
Referral has been placed. 

## 2018-05-29 NOTE — Telephone Encounter (Signed)
Will route to PCP for review. 

## 2018-05-30 ENCOUNTER — Other Ambulatory Visit: Payer: Medicaid Other | Admitting: Family Medicine

## 2018-06-01 ENCOUNTER — Ambulatory Visit: Payer: Medicaid Other | Admitting: Internal Medicine

## 2018-06-01 ENCOUNTER — Encounter: Payer: Self-pay | Admitting: Internal Medicine

## 2018-06-01 ENCOUNTER — Telehealth: Payer: Self-pay | Admitting: Hematology and Oncology

## 2018-06-01 VITALS — BP 146/70 | HR 92 | Ht <= 58 in | Wt 213.0 lb

## 2018-06-01 DIAGNOSIS — I10 Essential (primary) hypertension: Secondary | ICD-10-CM | POA: Diagnosis not present

## 2018-06-01 DIAGNOSIS — R0789 Other chest pain: Secondary | ICD-10-CM

## 2018-06-01 DIAGNOSIS — D649 Anemia, unspecified: Secondary | ICD-10-CM

## 2018-06-01 DIAGNOSIS — I5189 Other ill-defined heart diseases: Secondary | ICD-10-CM | POA: Diagnosis not present

## 2018-06-01 DIAGNOSIS — J45998 Other asthma: Secondary | ICD-10-CM

## 2018-06-01 DIAGNOSIS — I2510 Atherosclerotic heart disease of native coronary artery without angina pectoris: Secondary | ICD-10-CM

## 2018-06-01 LAB — CBC
Hematocrit: 29.7 % — ABNORMAL LOW (ref 34.0–46.6)
Hemoglobin: 8.9 g/dL — ABNORMAL LOW (ref 11.1–15.9)
MCH: 20.7 pg — ABNORMAL LOW (ref 26.6–33.0)
MCHC: 30 g/dL — ABNORMAL LOW (ref 31.5–35.7)
MCV: 69 fL — ABNORMAL LOW (ref 79–97)
Platelets: 568 10*3/uL — ABNORMAL HIGH (ref 150–450)
RBC: 4.29 x10E6/uL (ref 3.77–5.28)
RDW: 19.9 % — ABNORMAL HIGH (ref 12.3–15.4)
WBC: 16.4 10*3/uL — ABNORMAL HIGH (ref 3.4–10.8)

## 2018-06-01 NOTE — Patient Instructions (Addendum)
Your physician recommends that you continue on your current medications as directed. Please refer to the Current Medication list given to you today.  Your physician recommends that you return for lab work CBC.  Your physician wants you to follow-up in: April, 2020 with Dr. Harrington Challenger.  You will receive a reminder letter in the mail two months in advance. If you don't receive a letter, please call our office to schedule the follow-up appointment.

## 2018-06-01 NOTE — Progress Notes (Addendum)
Cardiology Office Note   Date:  06/01/2018   ID:  Raven Turner, Raven Turner December 24, 1970, MRN [id number]  PCP:  Charlott Rakes, MD  Cardiologist:   Dorris Carnes, MD   Pt presetns for f/u of SOB , chest pressure     History of Present Illness: Raven Turner is a 47 y.o. female with a history of chest pressure and HTN  Also history of DM, severre COPD/reactive airway dz and morbid obesity      Myovue in Aug 2016 showed no ischemia  She has been followed by Darlys Gales and B McQuaid  Since I saw her last in clinic she says her breathing is OK  For 2 days she has had chest pressure   Constant   Worse with breathing   Says that she is anemic   Now on irorn    Current Meds  Medication Sig  . albuterol (PROVENTIL HFA;VENTOLIN HFA) 108 (90 Base) MCG/ACT inhaler Inhale 2 puffs into the lungs every 4 (four) hours as needed for wheezing or shortness of breath (((PLAN B))). (Patient taking differently: Inhale 2 puffs into the lungs every 4 (four) hours as needed for wheezing or shortness of breath. )  . albuterol (PROVENTIL) (2.5 MG/3ML) 0.083% nebulizer solution USE 1 VIAL VIA NEBULIZER EVERY 4 HOURS AS NEEDED FOR WHEEZING OR SHORTNESS OF BREATH (Patient taking differently: Take 2.5 mg by nebulization every 4 (four) hours as needed for wheezing or shortness of breath. )  . aspirin EC 81 MG tablet Take 1 tablet (81 mg total) by mouth daily.  . Blood Glucose Monitoring Suppl (ACCU-CHEK AVIVA PLUS) W/DEVICE KIT Used as directed  . buprenorphine (BUTRANS - DOSED MCG/HR) 5 MCG/HR PTWK patch Place 5 mcg onto the skin once a week.  . cabergoline (DOSTINEX) 0.5 MG tablet Take 0.5 tablets (0.25 mg total) by mouth 2 (two) times a week.  . cholecalciferol (VITAMIN D) 1000 UNITS tablet Take 1,000 Units by mouth every morning.  . cloNIDine (CATAPRES) 0.2 MG tablet TAKE 1 TABLET(0.2 MG) BY MOUTH THREE TIMES DAILY  . DULERA 200-5 MCG/ACT AERO INHALE 2 PUFFS INTO THE LUNGS TWICE DAILY  . DUPIXENT 300  MG/2ML SOSY Inject 300 mg into the skin See admin instructions. Inject under skin every 15 days  . ferrous sulfate 325 (65 FE) MG tablet Take 1 tablet (325 mg total) by mouth daily.  . furosemide (LASIX) 20 MG tablet Take 1 tablet (20 mg total) by mouth every morning.  . gabapentin (NEURONTIN) 400 MG capsule Take 2 capsules (800 mg total) by mouth 3 (three) times daily.  Marland Kitchen glucose blood (RELION GLUCOSE TEST STRIPS) test strip 1 each by Other route 4 (four) times daily. ICD code E11.65  . HUMULIN R U-500 KWIKPEN 500 UNIT/ML kwikpen Inject 120-180 Units into the skin 3 (three) times daily. 180 U before breakfast, 160 U before lunch, 140 U before dinner  . hydrOXYzine (ATARAX/VISTARIL) 25 MG tablet TAKE 1 TABLET BY MOUTH AT BEDTIME AS NEEDED FOR ANXIETY. (Patient taking differently: Take 25 mg by mouth at bedtime as needed for anxiety. )  . Insulin Pen Needle (B-D ULTRAFINE III SHORT PEN) 31G X 8 MM MISC 1 application by Does not apply route daily.  . Insulin Syringe-Needle U-100 (BD INSULIN SYRINGE ULTRAFINE) 31G X 15/64" 1 ML MISC 1 each by Does not apply route 3 (three) times daily.  Elmore Guise Devices (ACCU-CHEK SOFTCLIX) lancets Use as instructed  . levothyroxine (SYNTHROID, LEVOTHROID) 25 MCG tablet Take 25 mcg  by mouth daily.  Marland Kitchen losartan (COZAAR) 100 MG tablet Take 1 tablet (100 mg total) by mouth every morning.  . metFORMIN (GLUCOPHAGE) 1000 MG tablet TAKE 1 TABLET BY MOUTH TWICE DAILY WITH A MEAL (Patient taking differently: Take 1,000 mg by mouth 2 (two) times daily with a meal. )  . methocarbamol (ROBAXIN) 750 MG tablet Take 1 tablet (750 mg total) by mouth 3 (three) times daily.  . Misc. Devices (CANE) MISC 1 each by Does not apply route daily.  . montelukast (SINGULAIR) 10 MG tablet Take 1 tablet (10 mg total) by mouth at bedtime.  Marland Kitchen omeprazole (PRILOSEC) 20 MG capsule TAKE ONE CAPSULE BY MOUTH EVERY DAY BEFORE BREAKFAST (Patient taking differently: Take 20 mg by mouth daily. BEFORE  BREAKFAST)  . Oyster Shell Calcium 500 MG TABS Take 1 tablet (1,250 mg total) by mouth daily.  Marland Kitchen PARoxetine (PAXIL) 20 MG tablet Take 1 tablet (20 mg total) by mouth daily.  . predniSONE (DELTASONE) 1 MG tablet 4 tabs (56m) daily. (Patient taking differently: Take 4 mg by mouth daily. )  . ranitidine (ZANTAC) 300 MG tablet Take 1 tablet (300 mg total) by mouth at bedtime.  . ReliOn Ultra Thin Lancets MISC 1 each by Does not apply route 4 (four) times daily. ICD code E11.65  . Respiratory Therapy Supplies (FLUTTER) DEVI Use as directed  . Spacer/Aero-Holding Chambers (AEROCHAMBER MV) inhaler Use as instructed  . spironolactone (ALDACTONE) 25 MG tablet Take 0.5 tablets (12.5 mg total) by mouth daily.  .Marland KitchenULTICARE MICRO PEN NEEDLES 32G X 4 MM MISC USE AS DIRECTED  . verapamil (CALAN-SR) 180 MG CR tablet Take 2 tablets (360 mg total) by mouth at bedtime.   Current Facility-Administered Medications for the 06/01/18 encounter (Office Visit) with RFay Records MD  Medication  . betamethasone acetate-betamethasone sodium phosphate (CELESTONE) injection 3 mg  . umeclidinium bromide (INCRUSE ELLIPTA) 62.5 MCG/INH 1 puff     Allergies:   Shellfish allergy; Septra [sulfamethoxazole-trimethoprim]; and Hydrocortisone   Past Medical History:  Diagnosis Date  . Anemia   . Asthma   . Bilateral hand numbness 06/25/2015  . Bone pain 02/27/2015   Diffuse bone pain in legs mostly but also in arms    . Breast pain, left 03/27/2015  . Callus of heel 12/16/2014  . Compression fracture   . Compression fracture of L1 lumbar vertebra (HCC) 08/26/2014  . Compression fracture of T12 vertebra ([place] 08/28/2014  . COPD (chronic obstructive pulmonary disease) ([place] 2013  . [place]ushing syndrome ([place] 2013   . Depression   . Diabetes ([place] 2013   . Diabetes mellitus type 2, uncontrolled ([place] 08/26/2014   Sees Dr. GKaty Fitch  Last visit 11/27/14.  No diabetic retinopathy    . Diabetes mellitus with neuropathy ([place]   .  Diastolic dysfunction without heart failure 10/14/2014   Grade 2 diastolic dysfunction 2/2 pulm HTN   . Generalized anxiety disorder 03/27/2015  . HTN (hypertension) 2005   . Hx of cataract surgery 11/03/2014  . LUQ abdominal pain 07/17/2015  . Medication management 05/12/2015  . Morbid obesity ([place] 09/08/2014  . 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 Non-English speaking patient 09/08/2014  . OSA (obstructive sleep apnea) 05/20/2015  . Osteopenia 11/19/2013   Dx with osteopenia in lumbar vertebra with partial compression of L1  . Pain due to onychomycosis of toenail 03/27/2015  . Secondary Cushing's syndrome/ iatrogenic   . Skin rash 07/17/2015  . Tachycardia 01/13/2015  . Upper airway cough syndrome 09/05/2014  Followed in Pulmonary clinic/ [facility]/ [facility]  - Trial off advair      09/05/2014 >>>  - trial off theoph 09/17/2014 and added flutter  - rec reduce dulera to 100 2 bid 07/13/2015     . Vision changes 06/25/2015  . Vitamin D deficiency 08/28/2014    Past Surgical History:  Procedure Laterality Date  . CATARACT EXTRACTION Bilateral 2014  . HAND SURGERY Right 12/18/2015  . VAGINAL HYSTERECTOMY       Social History:  The patient  reports that she has never smoked. She has never used smokeless tobacco. She reports that she does not drink alcohol or use drugs.   Family History:  The patient's family history includes Asthma in her father; Diabetes in her mother; Hypertension in her brother and sister; Stroke in her father.    ROS:  Please see the history of present illness. All other systems are reviewed and  Negative to the above problem except as noted.    PHYSICAL EXAM: VS:  BP (!) 146/70   Pulse 92   Ht 4' 10" (1.473 m)   Wt 213 lb (96.6 kg)   SpO2 96%   BMI 44.52 kg/m   GEN: Morbidly obese 47 yo in no acute distress  HEENT: normal  Neck: no JVD,  No carotid bruits, Neck full Cardiac: RRR; no murmurs, rubs, or gallops,No signif  edema  Respiratory:  Upper airway wheeze   Moving air below  GI: soft, nontender, nondistended, + BS  No hepatomegaly  MS: no deformity Moving all extremities   Skin: warm and dry, no rash Neuro:  Strength and sensation are intact Psych: euthymic mood, full affect   EKG:  EKG is ordered today    SR 70   Lipid Panel    Component Value Date/Time   CHOL 130 02/23/2018 0807   TRIG 95 02/23/2018 0807   HDL 52 02/23/2018 0807   CHOLHDL 2.5 02/23/2018 0807   CHOLHDL 4 12/15/2014 1051   VLDL 38.4 12/15/2014 1051   LDLCALC 59 02/23/2018 0807      Wt Readings from Last 3 Encounters:  06/01/18 213 lb (96.6 kg)  05/12/18 215 lb (97.5 kg)  05/09/18 215 lb 9.6 oz (97.8 kg)      ASSESSMENT AND PLAN:  1  Chest tightness/ SOB  Pleuritic    I think related to COPD   Moving air OK  2  CAD  Recent CT scan shows coronary artery calcifications  I am not convinced of active ischemia      2  Pulm  Appt this PM  3   Diastolic dysfunciton  Volume status is OK    5  HTN  BP is fair   I was better in Aug   I would not push meds furtehr   6  Anemia  Will check CBC    Hgb should be improving   Keep on asa for now    F/U in April 2020  Current medicines are reviewed at length with the patient today.  The patient does not have concerns regarding medicines.  Signed, Dorris Carnes, MD  06/01/2018 10:46 AM    [address], [place], [place]  [address] Phone: [phone]; Fax: [phone]

## 2018-06-01 NOTE — Telephone Encounter (Signed)
Error in previous msg. Pt has been scheduled to see Dr. Audelia Hives, not Dr. Lindi Adie, on 9/25 at 11am. Pt aware to arrive 30 minutes early.

## 2018-06-01 NOTE — Telephone Encounter (Deleted)
New referral received from Dr. Marlou Starks for dx of breast cancer. Pt has been scheduled to see Dr. Lindi Adie on 10/1 at 1pm. Pt aware to arrive early to be checked in on time. Letter mailed.

## 2018-06-05 NOTE — Progress Notes (Signed)
[facility] Initial Consultation  Patient Name:  Raven Turner  DOB: 1971-04-11  Date of Service: June 06, 2018  Referring Provider: Charlott Rakes, [place], [place] [address]   Consulting Physician: Henreitta Leber, MD Hematology/Oncology  Patient Care Team: Charlott Rakes, MD ([facility])   Reason for Referral: In the setting of long-standing anemia over a lengthy, but indeterminate period of time secondary to, at least iron deficiency, associated with persistent thrombocytosis and leukocytosis of unclear etiology, she presents now for further diagnostic and therapeutic recommendations.  History Present Illness: Raven Turner is a 47 year old gravida 3 para 3 premenopausal, resident of [place], originally from [place], his past medical history is significant for insulin requiring diabetes mellitus; primary hypertension for the past 20 years; dyslipidemia; coronary artery disease; hypothyroid disease for the past 18 months; bronchial asthma since childhood; nonalcoholic hepatosteatosis; vitamin D deficiency on replacement therapy; pituitary microadenoma; elevated BMI; obstructive sleep apnea; gastroesophageal reflux disease; sleep disturbance; peripheral neuropathy involving the hands and fingers; degenerative joint disease involving the neck, lower back, knees bilaterally, fingers/hands bilaterally; anxiety disorder; mild depression; osteopenia; and iron deficiency anemia.  She understands and speaks a very minimal amount of [place].  She checks her sugar with glucometer 4 times daily.  She is accompanied by an official [employer], Elmo Putt.  Raven Turner has been aware of her anemia for many years.  She has been taking iron supplements for at least the past year consisting at present, of ferrous sulfate: 325 mg once daily.  While presenting to the emergency department at  [facility] on May 12, 2018 a complete blood count showed hemoglobin 7.9 hematocrit 26.8 MCV 71.8 MCH 21.2 RDW 18.1; platelets 534,000.  On an outpatient basis on June 01, 2018 complete blood count showed hemoglobin 8.9 hematocrit 29.7 MCV 69 MCH 20.7 RDW 19.9 WBC 16.4; platelets 568,000.  A look back much further into her medical record from August 26, 2014 a complete blood count showed hemoglobin 13.0 hematocrit 38.8 MCV 78 MCH 26 RDW 16.5 WBC 14.7; platelets 444,000.  On September 08, 2014 an HIV antibody was negative.  On September 15, 2014 a complete blood count showed hemoglobin 13.4 hematocrit 39.4 WBC 18.5 with 90% neutrophils 6% lymphocytes 4% monocytes; platelets 441,000.  On September 13, 2015 a complete blood count showed hemoglobin 13.0 hematocrit 39.8 WBC 11.1; platelets 453,000.  More recently on October 11, 2017 a complete blood count showed hemoglobin 10.3 hematocrit 32.0 MCV 75 RDW 16.9 WBC 12.1 with 66% neutrophils 23% lymphocytes 9% monocytes 2% eosinophils 1% basophil; platelets 453,000.  Prior to today's visit her most recent complete blood count shows hemoglobin 8.9 hematocrit 29.7 MCV 69 MCH 20.7 RDW 19.9 WBC 16.4; platelets 568,000.    She was hospitalized in 2015 for bronchitis.  In 2016 she was hospitalized for cholelithiasis.  She has chronic degenerative joint disease involving the neck, lower back, fingers, and knees.  She has compression fractures involving both the thoracic and lumbar spine.  She denies rheumatoid or gouty arthritis.  She reports no peripheral arterial or venous thromboembolic disease.  The results of her CT imaging from November 24, 2017 suggests extensive coronary artery disease/calcification.  She has no viral hepatitis, inflammatory bowel disease, or symptomatic diverticulosis.  She has never had a screening colonoscopy.  Over the past 2 months, she complains that her weight has increased 20 pounds.  Her energy level is chronically low.  He complains of  frequent  episodes of dizziness and lightheadedness without vertigo.  She denies headache syncope or near syncopal episodes.  She reports no unusual cough, sore throat, or orthopnea.  She has chronic but stable exertional dyspnea unchanged over her usual baseline.  She denies pain or difficulty in swallowing.  She has no fever, shaking chills, sweats, or flulike symptoms.  She denies heartburn or indigestion.  She has no nausea, vomiting, diarrhea, or constipation.  She moves her bowels once daily.  Her dyspepsia is controlled with protein pump inhibitor therapy.  She reports no melena or bright red blood per rectum.  Her ankles swell slightly during the daytime and received overnight.  She complains of paresthesias involving the hands bilaterally.  There are no new focal areas of bone, joint, muscle pain.  It is with this background she presents now for further diagnostic and therapeutic recommendations in the setting of long-standing anemia associated with at least a component of iron deficiency with thrombocytosis and leukocytosis of unclear etiology as outlined above.  Past Medical History:  Diagnosis Date  . Anemia   . Asthma   . Bilateral hand numbness 06/25/2015  . Bone pain 02/27/2015   Diffuse bone pain in legs mostly but also in arms    . Breast pain, left 03/27/2015  . Callus of heel 12/16/2014  . Compression fracture   . Compression fracture of L1 lumbar vertebra (HCC) 08/26/2014  . Compression fracture of T12 vertebra ([place]) 08/28/2014  . COPD (chronic obstructive pulmonary disease) ([place]) 2013  . Cushing syndrome ([place]) 2013   . Depression   . Diabetes ([place]) 2013   . Diabetes mellitus type 2, uncontrolled ([place]) 08/26/2014   Sees Dr. Katy Fitch.  Last visit 11/27/14.  No diabetic retinopathy    . Diabetes mellitus with neuropathy ([place])   . Diastolic dysfunction without heart failure 10/14/2014   Grade 2 diastolic dysfunction 2/2 pulm HTN   . Generalized anxiety disorder 03/27/2015  . HTN  (hypertension) 2005   . Hx of cataract surgery 11/03/2014  . LUQ abdominal pain 07/17/2015  . Medication management 05/12/2015  . Morbid obesity ([place]) 09/08/2014  . 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 Non-English speaking patient 09/08/2014  . OSA (obstructive sleep apnea) 05/20/2015  . Osteopenia 11/19/2013   Dx with osteopenia in lumbar vertebra with partial compression of L1  . Pain due to onychomycosis of toenail 03/27/2015  . Secondary Cushing's syndrome/ iatrogenic   . Skin rash 07/17/2015  . Tachycardia 01/13/2015  . Upper airway cough syndrome 09/05/2014   Followed in Pulmonary clinic/ [facility]/ [place]  - Trial off advair      09/05/2014 >>>  - trial off theoph 09/17/2014 and added flutter  - rec reduce dulera to 100 2 bid 07/13/2015     . Vision changes 06/25/2015  . Vitamin D deficiency 08/28/2014   Gynecologic History: Her menarche was at age 70 years. She is a gravida 3 para 3. She continues to menstruate every 28 days. She has 5 days of flow with 2 heavy days initially. Her first pregnancy was at age 22 years. Her last pregnancy was at age 49 years.   She has no history of oral contraception.  Past Surgical History:  Procedure Laterality Date  . CATARACT EXTRACTION Bilateral 2014  . HAND SURGERY Right 12/18/2015          Family History  Problem Relation Age of Onset  . Diabetes Mother   . Stroke Father   . Asthma Father   . Hypertension Sister   .  Hypertension Brother   Brother: (1): No known major medical problems Sisters (4): Primary hypertension; kidney disorder. There is no history of blood disorders in the immediate family. Her paternal grandfather has prostate cancer. Her maternal grandmother has a "gynecologic" cancer.  Social History   Tobacco Use  . Smoking status: Never Smoker  . Smokeless tobacco: Never Used  Substance Use Topics  . Alcohol use: No    Alcohol/week: 0.0 standard drinks  . Drug use: No  Raven Turner is married for the second time. She  worked previously as a Engineer, site. Currently she is not employed. She is married now for the past 18 years She has 3 healthy children ranging in ages from 20 to 36 years. She is a lifetime non-smoker. In the distant past she did consume alcoholic beverages infrequently, now none. She reports no use of recreational drugs. She has not used e-cigarettes.  Transfusion History: None  Exposure History: She has no known exposure to toxic chemicals, radiation, or pesticides  Allergies  Allergen Reactions  . Shellfish Allergy Anaphylaxis  . Septra [Sulfamethoxazole-Trimethoprim] Rash, Other (See Comments) and Itching    Facial redness with pruritus  Facial redness with pruritus   . Hydrocortisone Rash  No seasonal allergies  Current Outpatient Medications on File Prior to Visit  Medication Sig  . albuterol (PROVENTIL HFA;VENTOLIN HFA) 108 (90 Base) MCG/ACT inhaler Inhale 2 puffs into the lungs every 4 (four) hours as needed for wheezing or shortness of breath (((PLAN B))). (Patient taking differently: Inhale 2 puffs into the lungs every 4 (four) hours as needed for wheezing or shortness of breath. )  . albuterol (PROVENTIL) (2.5 MG/3ML) 0.083% nebulizer solution USE 1 VIAL VIA NEBULIZER EVERY 4 HOURS AS NEEDED FOR WHEEZING OR SHORTNESS OF BREATH (Patient taking differently: Take 2.5 mg by nebulization every 4 (four) hours as needed for wheezing or shortness of breath. )  . aspirin EC 81 MG tablet Take 1 tablet (81 mg total) by mouth daily.  Marland Kitchen atorvastatin (LIPITOR) 40 MG tablet Take 1 tablet (40 mg total) by mouth daily.  . Blood Glucose Monitoring Suppl (ACCU-CHEK AVIVA PLUS) W/DEVICE KIT Used as directed  . buprenorphine (BUTRANS - DOSED MCG/HR) 5 MCG/HR PTWK patch Place 5 mcg onto the skin once a week.  . cabergoline (DOSTINEX) 0.5 MG tablet Take 0.5 tablets (0.25 mg total) by mouth 2 (two) times a week.  . cholecalciferol (VITAMIN D) 1000 UNITS tablet Take 1,000 Units by mouth  every morning.  . cloNIDine (CATAPRES) 0.2 MG tablet TAKE 1 TABLET(0.2 MG) BY MOUTH THREE TIMES DAILY  . DULERA 200-5 MCG/ACT AERO INHALE 2 PUFFS INTO THE LUNGS TWICE DAILY  . DUPIXENT 300 MG/2ML SOSY Inject 300 mg into the skin See admin instructions. Inject under skin every 15 days  . ferrous sulfate 325 (65 FE) MG tablet Take 1 tablet (325 mg total) by mouth daily.  . furosemide (LASIX) 20 MG tablet Take 1 tablet (20 mg total) by mouth every morning.  . gabapentin (NEURONTIN) 400 MG capsule Take 2 capsules (800 mg total) by mouth 3 (three) times daily.  Marland Kitchen glucose blood (RELION GLUCOSE TEST STRIPS) test strip 1 each by Other route 4 (four) times daily. ICD code E11.65  . HUMULIN R U-500 KWIKPEN 500 UNIT/ML kwikpen Inject 120-180 Units into the skin 3 (three) times daily. 180 U before breakfast, 160 U before lunch, 140 U before dinner  . hydrOXYzine (ATARAX/VISTARIL) 25 MG tablet TAKE 1 TABLET BY MOUTH AT BEDTIME AS NEEDED FOR  ANXIETY. (Patient taking differently: Take 25 mg by mouth at bedtime as needed for anxiety. )  . Insulin Pen Needle (B-D ULTRAFINE III SHORT PEN) 31G X 8 MM MISC 1 application by Does not apply route daily.  . Insulin Syringe-Needle U-100 (BD INSULIN SYRINGE ULTRAFINE) 31G X 15/64" 1 ML MISC 1 each by Does not apply route 3 (three) times daily.  Elmore Guise Devices (ACCU-CHEK SOFTCLIX) lancets Use as instructed  . levothyroxine (SYNTHROID, LEVOTHROID) 25 MCG tablet Take 25 mcg by mouth daily.  Marland Kitchen losartan (COZAAR) 100 MG tablet Take 1 tablet (100 mg total) by mouth every morning.  . metFORMIN (GLUCOPHAGE) 1000 MG tablet TAKE 1 TABLET BY MOUTH TWICE DAILY WITH A MEAL (Patient taking differently: Take 1,000 mg by mouth 2 (two) times daily with a meal. )  . methocarbamol (ROBAXIN) 750 MG tablet Take 1 tablet (750 mg total) by mouth 3 (three) times daily.  . Misc. Devices (CANE) MISC 1 each by Does not apply route daily.  . montelukast (SINGULAIR) 10 MG tablet Take 1 tablet (10 mg  total) by mouth at bedtime.  Marland Kitchen omeprazole (PRILOSEC) 20 MG capsule TAKE ONE CAPSULE BY MOUTH EVERY DAY BEFORE BREAKFAST (Patient taking differently: Take 20 mg by mouth daily. BEFORE BREAKFAST)  . Oyster Shell Calcium 500 MG TABS Take 1 tablet (1,250 mg total) by mouth daily.  Marland Kitchen PARoxetine (PAXIL) 20 MG tablet Take 1 tablet (20 mg total) by mouth daily.  . predniSONE (DELTASONE) 1 MG tablet 4 tabs (67m) daily. (Patient taking differently: Take 4 mg by mouth daily. )  . ranitidine (ZANTAC) 300 MG tablet Take 1 tablet (300 mg total) by mouth at bedtime.  . ReliOn Ultra Thin Lancets MISC 1 each by Does not apply route 4 (four) times daily. ICD code E11.65  . Respiratory Therapy Supplies (FLUTTER) DEVI Use as directed  . Spacer/Aero-Holding Chambers (AEROCHAMBER MV) inhaler Use as instructed  . spironolactone (ALDACTONE) 25 MG tablet Take 0.5 tablets (12.5 mg total) by mouth daily.  .Marland KitchenULTICARE MICRO PEN NEEDLES 32G X 4 MM MISC USE AS DIRECTED  . verapamil (CALAN-SR) 180 MG CR tablet Take 2 tablets (360 mg total) by mouth at bedtime.   Current Facility-Administered Medications on File Prior to Visit  Medication  . betamethasone acetate-betamethasone sodium phosphate (CELESTONE) injection 3 mg  . DOBUTamine (DOBUTREX) 1,000 mcg/mL in dextrose 5% 250 mL infusion  . umeclidinium bromide (INCRUSE ELLIPTA) 62.5 MCG/INH 1 puff    Review of Systems: Constitutional: No fever, sweats, or shaking chills.  No appetite or weight deficit.  She has a 20 pound weight loss over the past 18 months.  BMI is elevated.  Energy level is low; hydroxyzine to help sleep. Skin: No rash, scaling, sores, lumps, or jaundice. HEENT: No visual changes or hearing deficit; no macular degeneration or glaucoma. Pulmonary: No unusual cough, sore throat, or orthopnea; obstructive sleep apnea; bronchial asthma; DOE. Breasts: Fibrocystic breast disease. Cardiovascular: Coronary artery disease; no angina or myocardial infarction.  No  cardiac dysrhythmia; essential hypertension and dyslipidemia; occasional subjective palpitations. Gastrointestinal: No indigestion, dysphagia, abdominal pain, diarrhea, or constipation.  No nausea or vomiting.  No pain or difficulty in swallowing.  No change in bowel habits; NASH; GERD; normal brown stool while on ferrous sulfate; EGD 2 years ago; no prior colonoscopy. Genitourinary: No urinary frequency, urgency, gross hematuria, or dysuria. Musculoskeletal: Diffuse arthralgias and myalgias including the neck, lower back, fingers/hands, and knees; no joint swelling or deformity or instability. Hematologic: No bleeding  tendency or easy bruisability. Endocrine: No intolerance to heat or cold; hypothyroid disease and insulin requiring diabetes mellitus. Vascular: No peripheral arterial or venous thromboembolic disease. Psychological: Chronic anxiety; chronic depression without suicidal ideation; no new mood changes. Neurological: Frequent dizziness and lightheadedness; no headache, syncope, or near syncopal episodes; numbness or tingling in the hands. She ambulates with a cane due to her lower back compression fractures; sometimes uses a walker for distance.  Physical Examination: Vital Signs: Body surface area is 2 meters squared.  BP 149/70     BMI 44.94    WT 215 lbs.   HT 4' 10 '     T 98.9 degrees    HR 91    RR 18    O2 Sat 98% Constitutional:  Raven Turner is fully nourished and developed albeit overweight. She looks age appropriate. She is friendly and cooperative without respiratory compromise at rest. Skin: No rashes, scaling, dryness, jaundice, or itching. HEENT: Head is normocephalic and atraumatic.  Pupils are equal round and reactive to light and accommodation.  Sclerae are anicteric.  Conjunctivae are pale.  No sinus tenderness nor oropharyngeal lesions.  Lips without cracking or peeling; tongue without mass, inflammation, or nodularity.  Mucous membranes are moist.  Dentition  is poor. Neck: Supple and symmetric.  No jugular venous distention or thyromegaly.  Trachea is midline. Lymphatics: No cervical or supraclavicular lymphadenopathy.  No epitrochlear, axillary, or inguinal lymphadenopathy is appreciated. Breasts: No mass, discharge, dimpling, or retraction. Respiratory/chest: Thorax is symmetrical.  Breath sounds are clear to auscultation and percussion.  Normal excursion and respiratory effort. Back: Symmetric without deformity or tenderness. Cardiovascular: Heart rate and rhythm are regular without murmurs or rubs. Gastrointestinal: Abdomen is soft, obese, nontender; no organomegaly.  Bowel sounds are normoactive.  No masses are appreciated. Rectal examination: Not performed. Extremities: Her legs are thick and large, but not edematous.  In the lower extremities, there is no asymmetric swelling, erythema, tenderness, or cord formation.  No clubbing, cyanosis, nor edema. Hematologic: No petechiae, hematomas, or ecchymoses. Psychological She is oriented to person, place, and time; normal affect. Neurological: There are no gross neurologic deficits.  Laboratory Results: I have reviewed the data as listed:  Ref Range & Units 14:48  WBC Count 3.9 - 10.3 K/uL 14.7High    RBC 3.70 - 5.45 MIL/uL 4.19   Hemoglobin 11.6 - 15.9 g/dL 9.0Low    HCT 34.8 - 46.6 % 29.7Low    MCV 79.5 - 101.0 fL 70.9Low    MCH 25.1 - 34.0 pg 21.5Low    MCHC 31.5 - 36.0 g/dL 30.3Low    RDW 11.2 - 14.5 % 21.9High    Platelet Count 145 - 400 K/uL 537High    Neutrophils Relative % % 75   Neutro Abs 1.5 - 6.5 K/uL 11.1High    Lymphocytes Relative % 18   Lymphs Abs 0.9 - 3.3 K/uL 2.6   Monocytes Relative % 6   Monocytes Absolute 0.1 - 0.9 K/uL 0.8   Eosinophils Relative % 1   Eosinophils Absolute 0.0 - 0.5 K/uL 0.2   Basophils Relative % 0   Basophils Absolute 0.0 - 0.1 K/uL 0.1   Ferritin 17 (11-307) Reticulocyte count 2.0%  Ref Range & Units 13:41  Sodium 135 - 145 mmol/L 141    Potassium 3.5 - 5.1 mmol/L 3.9   Chloride 98 - 111 mmol/L 103   CO2 22 - 32 mmol/L 28   Glucose, Bld 70 - 99 mg/dL 121High  BUN 6 - 20 mg/dL 13   Creatinine, Ser 0.44 - 1.00 mg/dL 1.02High    Calcium 8.9 - 10.3 mg/dL 9.7   Total Protein 6.5 - 8.1 g/dL 8.1   Albumin 3.5 - 5.0 g/dL 3.4Low    AST 15 - 41 U/L 28   ALT 0 - 44 U/L 39   Alkaline Phosphatase 38 - 126 U/L 122   Total Bilirubin 0.3 - 1.2 mg/dL <0.2Low    GFR calc non Af Amer >60 mL/min >60   GFR calc Af Amer >60 mL/min >60   LDH 191 Vitamin B12 371 (180-914)   Ref Range & Units 13:35  Color, Urine YELLOW YELLOW   APPearance CLEAR HAZYAbnormal    Specific Gravity, Urine 1.005 - 1.030 1.023   pH 5.0 - 8.0 6.0   Glucose, UA NEGATIVE mg/dL NEGATIVE   Hgb urine dipstick NEGATIVE NEGATIVE   Bilirubin Urine NEGATIVE NEGATIVE   Ketones, ur NEGATIVE mg/dL 5Abnormal    Protein, ur NEGATIVE mg/dL 30Abnormal    Nitrite NEGATIVE NEGATIVE   Leukocytes, UA NEGATIVE TRACEAbnormal    RBC / HPF 0 - 5 RBC/hpf 0-5   WBC, UA 0 - 5 WBC/hpf 6-10   Bacteria, UA NONE SEEN NONE SEEN   Squamous Epithelial / LPF 0 - 5 0-5   Mucus  PRESENT   Hyaline Casts, UA  PRESENT    Diagnostic/Imaging Studies: November 24, 2017 CT CHEST WITHOUT CONTRAST  COMPARISON:  No priors.  FINDINGS: Cardiovascular: Heart size is borderline enlarged. There is no significant pericardial fluid, thickening or pericardial calcification. There is aortic atherosclerosis, as well as atherosclerosis of the great vessels of the mediastinum and the coronary arteries, including calcified atherosclerotic plaque in the left anterior descending coronary artery.  Mediastinum/Nodes: No pathologically enlarged mediastinal or hilar lymph nodes. Please note that accurate exclusion of hilar adenopathy is limited on noncontrast CT scans. Esophagus is unremarkable in appearance. No axillary lymphadenopathy.  Lungs/Pleura: High-resolution images demonstrate no  significant regions of ground-glass attenuation, subpleural reticulation, parenchymal banding, traction bronchiectasis or frank honeycombing. Inspiratory and expiratory imaging demonstrates mild air trapping indicative of small airways disease. No acute consolidative airspace disease. No pleural effusions. No suspicious appearing pulmonary nodules or masses.  Upper Abdomen: Visualized portions of the upper abdomen demonstrate diffuse low attenuation throughout the visualized portions of the hepatic parenchyma, indicative of hepatic steatosis.  Musculoskeletal: Several chronic appearing compression fractures of inferior endplate of T3, superior endplate of T6, T7 vertebral body, superior endplate of T8, superior and inferior endplates of T9 and superior and inferior endplates of D17, most severe at T9 where there is 60% loss of central vertebral body height. Multiple old healed bilateral rib fractures. There are no aggressive appearing lytic or blastic lesions noted in the visualized portions of the skeleton.  IMPRESSION: 1. No findings to suggest interstitial lung disease. 2. Mild air trapping indicative of mild small airways disease. 3. Aortic atherosclerosis, in addition to left anterior descending coronary artery disease. Please note that although the presence of coronary artery calcium documents the presence of coronary artery disease, the severity of this disease and any potential stenosis cannot be assessed on this non-gated CT examination. Assessment for potential risk factor modification, dietary therapy or pharmacologic therapy may be warranted, if clinically indicated. 4. Hepatic steatosis. 5. Additional incidental findings, as above.  Aortic Atherosclerosis (ICD10-I70.0).  Vinnie Langton M.D. 11/24/2017 15:25  June 19, 2017 DIGITAL SCREENING BILATERAL MAMMOGRAM WITH CAD COMPARISON:  Previous exam(s).  ACR Breast Density  Category c: The breast tissue is  heterogeneously dense, which may obscure small masses.  FINDINGS: There are no findings suspicious for malignancy. Images were processed with CAD.  IMPRESSION: No mammographic evidence of malignancy. A result letter of this screening mammogram will be mailed directly to the patient.  RECOMMENDATION: Screening mammogram in one year. (Code:SM-B-01Y)  BI-RADS CATEGORY  1: Negative.  Nolon Nations M.D. 06/19/2017 09:05  Summary/Assessment: 1.  In the setting of long-standing anemia over a lengthy, but indeterminate period of time, secondary to, at least iron deficiency, associated with persistent thrombocytosis and leukocytosis of unclear etiology, she presents now for further diagnostic and therapeutic recommendations.  2. Raven Turner has been aware of her anemia for many years.  Apparently she was not aware of either thrombocytosis or leukocytosis.  She has been taking iron supplements for at least the past 10 years consisting at present, of ferrous sulfate: 325 mg once daily.  While [facility] on [facility] 12, 2018 a complete blood count showed hemoglobin 7.9 hematocrit 26.8 MCV 71.8 MCH 21.2 RDW 18.1; platelets 534,000.  On an outpatient basis on June 01, 2018 complete blood count showed hemoglobin 8.9 hematocrit 29.7 MCV 69 MCH 20.7 RDW 19.9 WBC 16.4; platelets 568,000.   3.  Prior to today's visit on June 01, 2018 her most recent complete blood count shows hemoglobin 8.9 hematocrit 29.7 MCV 69 MCH 20.7 RDW 19.9 WBC 16.4; platelets 568,000.   4.  At present she has no active infectious symptoms. She was hospitalized in 2015 for bronchitis. In 2016 she was hospitalized for cholelithiasis.  No surgery was performed.  She has chronic degenerative joint disease involving the neck, lower back, fingers, and knees.  She has compression fractures involving both the thoracic and lumbar spine.  She denies rheumatoid or gouty arthritis.  She  reports no peripheral arterial or venous thromboembolic disease.  The results of her CT imaging from November 24, 2017 suggests extensive coronary artery disease/calcification.  She has no viral hepatitis, inflammatory bowel disease, or symptomatic diverticulosis.  She has never had a screening colonoscopy.  5. Over the past 2 months, she complains that her weight has increased 20 pounds.  Her energy level is chronically low.  He complains of frequent episodes of dizziness and lightheadedness without vertigo.  She denies headache syncope or near syncopal episodes.  She reports no unusual cough, sore throat, or orthopnea.  She has chronic but stable exertional dyspnea unchanged over her usual baseline.  She denies pain or difficulty in swallowing.  She has no fever, shaking chills, sweats, or flulike symptoms.  She denies heartburn or indigestion.  She has no nausea, vomiting, diarrhea, or constipation.  She moves her bowels once daily.  Her dyspepsia is controlled with protein pump inhibitor therapy.  She reports no melena or bright red blood per rectum.  Her ankles swell slightly during the daytime and received overnight.  She complains of paresthesias involving the hands bilaterally.  There are no new focal areas of bone, joint, muscle pain.  6.  Her other comorbid problems include insulin requiring diabetes mellitus; primary hypertension for the past 20 years; dyslipidemia; coronary artery disease; hypothyroid disease for the past 18 months; bronchial asthma since childhood; nonalcoholic hepatosteatosis; vitamin D deficiency on replacement therapy; pituitary microadenoma; elevated BMI; obstructive sleep apnea; gastroesophageal reflux disease; sleep disturbance; peripheral neuropathy involving the hands and fingers; degenerative joint disease involving the neck, lower back, knees bilaterally, fingers/hands bilaterally; anxiety disorder; mild depression;  osteopenia; and iron deficiency anemia.    Recommendation/Plan: 1.  We discussed in detail the importance of identifying the cause or causes of her underlying anemia, thrombocytosis and leukocytosis.  Not infrequently, iron deficiency anemia is associated with a reactive thrombocytosis.  If the anemia and iron deficiency is corrected, generally the thrombocytosis self corrects.  2.  The results of her laboratory studies from today were not available at the time of discharge.  Some of those results are detailed above.  3.  While her iron studies from May 09, 2018 suggest iron deficiency anemia, there are other potential confounding contributing factors.  Those former results include serum iron 17 TIBC 377 iron saturation 5% ferritin 15.  A repeat ferritin from today is 17.  4.  A battery of laboratory studies were obtained today to exclude an underlying hemolytic process, paraproteinemia, myeloproliferative disorder, nutritional deficit, metabolic explanation, or autoimmune phenomenon.  Those results are not available at present, but will be discussed in detail at the time of her next visit.  5.  It is anticipated that her iron deficit will be replaced parenterally.  It is clear that oral iron has not replenished her iron stores.  6.  Because she menstruates every 28 days on a regular basis, unchanged over her usual baseline, it may be necessary to replace her with iron and follow her serially in the future.  I discussed with her also the possibility of endoscopic evaluation given her complex history, and prolonged iron deficiency uncorrected with ferrous sulfate.  She did have an esophagogastroduodenoscopy 2 years earlier which failed to reveal any evidence of bleeding tendency.  It may be useful to repeat that study if a colonoscopy is non-contributory.  This will be discussed in thorough detail at the time of her next visit.  7.  It is recommended that she continue ongoing yearly cancer screening with bilateral screening  mammography.  8.  Barring any unforeseen complications, her next scheduled doctor visit is on October 14 to discuss the results of the above.  The total time spent discussing the results of her former laboratory studies, importance of evaluating anemia, thrombocytosis, and leukocytosis,  preliminary considerations and differential diagnoses with recommendations and tentative plan to include iron replacement parenterally was 60 minutes.  At least 50% of that time was spent in discussion, reviewing outside records, laboratory evaluation, counseling, and answering questions. All questions were answered to her apparent satisfaction. She knows to call me with any problems, questions, or concerns.  This note was dictated using voice activated technology/software.  Unfortunately, typographical errors are not uncommon, and transcription is subject to mistakes and regrettably misinterpretation.  If necessary, clarification of the above information can be discussed with me at any time.  Thank you Dr. Margarita Rana for allowing my participation in the care of Raven Turner. I will keep you closely informed as the results of her laboratory evaluation become available.  Please do not hesitate to call should any questions arise regarding this initial consultation and discussion.  FOLLOW UP: AS DIRECTED   cc:   Charlott Rakes, MD   Henreitta Leber, MD  Hematology/Oncology  [facility] [address] [place], [place] [address] Office: [phone] OMBT: [phone]

## 2018-06-06 ENCOUNTER — Encounter: Payer: Self-pay | Admitting: Hematology and Oncology

## 2018-06-06 ENCOUNTER — Inpatient Hospital Stay: Payer: Medicaid Other | Attending: Hematology and Oncology | Admitting: Hematology and Oncology

## 2018-06-06 ENCOUNTER — Telehealth: Payer: Self-pay | Admitting: Hematology and Oncology

## 2018-06-06 ENCOUNTER — Telehealth: Payer: Self-pay

## 2018-06-06 ENCOUNTER — Inpatient Hospital Stay: Payer: Medicaid Other

## 2018-06-06 VITALS — BP 149/70 | HR 91 | Temp 98.9°F | Resp 18 | Ht <= 58 in | Wt 215.0 lb

## 2018-06-06 DIAGNOSIS — E039 Hypothyroidism, unspecified: Secondary | ICD-10-CM

## 2018-06-06 DIAGNOSIS — E559 Vitamin D deficiency, unspecified: Secondary | ICD-10-CM

## 2018-06-06 DIAGNOSIS — I2510 Atherosclerotic heart disease of native coronary artery without angina pectoris: Secondary | ICD-10-CM | POA: Diagnosis not present

## 2018-06-06 DIAGNOSIS — E11649 Type 2 diabetes mellitus with hypoglycemia without coma: Secondary | ICD-10-CM

## 2018-06-06 DIAGNOSIS — J449 Chronic obstructive pulmonary disease, unspecified: Secondary | ICD-10-CM

## 2018-06-06 DIAGNOSIS — Z79899 Other long term (current) drug therapy: Secondary | ICD-10-CM | POA: Diagnosis not present

## 2018-06-06 DIAGNOSIS — D72829 Elevated white blood cell count, unspecified: Secondary | ICD-10-CM

## 2018-06-06 DIAGNOSIS — M545 Low back pain, unspecified: Secondary | ICD-10-CM

## 2018-06-06 DIAGNOSIS — I10 Essential (primary) hypertension: Secondary | ICD-10-CM

## 2018-06-06 DIAGNOSIS — D509 Iron deficiency anemia, unspecified: Secondary | ICD-10-CM

## 2018-06-06 DIAGNOSIS — E785 Hyperlipidemia, unspecified: Secondary | ICD-10-CM

## 2018-06-06 DIAGNOSIS — G5603 Carpal tunnel syndrome, bilateral upper limbs: Secondary | ICD-10-CM

## 2018-06-06 DIAGNOSIS — I272 Pulmonary hypertension, unspecified: Secondary | ICD-10-CM | POA: Diagnosis not present

## 2018-06-06 DIAGNOSIS — D75839 Thrombocytosis, unspecified: Secondary | ICD-10-CM

## 2018-06-06 DIAGNOSIS — K760 Fatty (change of) liver, not elsewhere classified: Secondary | ICD-10-CM

## 2018-06-06 DIAGNOSIS — G4733 Obstructive sleep apnea (adult) (pediatric): Secondary | ICD-10-CM

## 2018-06-06 DIAGNOSIS — S22080A Wedge compression fracture of T11-T12 vertebra, initial encounter for closed fracture: Secondary | ICD-10-CM

## 2018-06-06 DIAGNOSIS — E119 Type 2 diabetes mellitus without complications: Secondary | ICD-10-CM | POA: Diagnosis not present

## 2018-06-06 DIAGNOSIS — F418 Other specified anxiety disorders: Secondary | ICD-10-CM | POA: Diagnosis not present

## 2018-06-06 DIAGNOSIS — G4730 Sleep apnea, unspecified: Secondary | ICD-10-CM

## 2018-06-06 DIAGNOSIS — D473 Essential (hemorrhagic) thrombocythemia: Secondary | ICD-10-CM

## 2018-06-06 DIAGNOSIS — J45909 Unspecified asthma, uncomplicated: Secondary | ICD-10-CM | POA: Diagnosis not present

## 2018-06-06 DIAGNOSIS — M8580 Other specified disorders of bone density and structure, unspecified site: Secondary | ICD-10-CM | POA: Diagnosis not present

## 2018-06-06 DIAGNOSIS — I700 Atherosclerosis of aorta: Secondary | ICD-10-CM | POA: Diagnosis not present

## 2018-06-06 DIAGNOSIS — Z7982 Long term (current) use of aspirin: Secondary | ICD-10-CM

## 2018-06-06 DIAGNOSIS — R42 Dizziness and giddiness: Secondary | ICD-10-CM | POA: Diagnosis not present

## 2018-06-06 DIAGNOSIS — F411 Generalized anxiety disorder: Secondary | ICD-10-CM

## 2018-06-06 DIAGNOSIS — K7581 Nonalcoholic steatohepatitis (NASH): Secondary | ICD-10-CM

## 2018-06-06 DIAGNOSIS — K219 Gastro-esophageal reflux disease without esophagitis: Secondary | ICD-10-CM

## 2018-06-06 DIAGNOSIS — G8929 Other chronic pain: Secondary | ICD-10-CM

## 2018-06-06 DIAGNOSIS — S32010S Wedge compression fracture of first lumbar vertebra, sequela: Secondary | ICD-10-CM

## 2018-06-06 LAB — URINALYSIS, COMPLETE (UACMP) WITH MICROSCOPIC
Bacteria, UA: NONE SEEN
Bilirubin Urine: NEGATIVE
Glucose, UA: NEGATIVE mg/dL
Hgb urine dipstick: NEGATIVE
Ketones, ur: 5 mg/dL — AB
Nitrite: NEGATIVE
Protein, ur: 30 mg/dL — AB
Specific Gravity, Urine: 1.023 (ref 1.005–1.030)
pH: 6 (ref 5.0–8.0)

## 2018-06-06 LAB — CBC WITH DIFFERENTIAL (CANCER CENTER ONLY)
Basophils Absolute: 0.1 10*3/uL (ref 0.0–0.1)
Basophils Relative: 0 %
Eosinophils Absolute: 0.2 10*3/uL (ref 0.0–0.5)
Eosinophils Relative: 1 %
HCT: 29.7 % — ABNORMAL LOW (ref 34.8–46.6)
Hemoglobin: 9 g/dL — ABNORMAL LOW (ref 11.6–15.9)
Lymphocytes Relative: 18 %
Lymphs Abs: 2.6 10*3/uL (ref 0.9–3.3)
MCH: 21.5 pg — ABNORMAL LOW (ref 25.1–34.0)
MCHC: 30.3 g/dL — ABNORMAL LOW (ref 31.5–36.0)
MCV: 70.9 fL — ABNORMAL LOW (ref 79.5–101.0)
Monocytes Absolute: 0.8 10*3/uL (ref 0.1–0.9)
Monocytes Relative: 6 %
Neutro Abs: 11.1 10*3/uL — ABNORMAL HIGH (ref 1.5–6.5)
Neutrophils Relative %: 75 %
Platelet Count: 537 10*3/uL — ABNORMAL HIGH (ref 145–400)
RBC: 4.19 MIL/uL (ref 3.70–5.45)
RDW: 21.9 % — ABNORMAL HIGH (ref 11.2–14.5)
WBC Count: 14.7 10*3/uL — ABNORMAL HIGH (ref 3.9–10.3)

## 2018-06-06 LAB — COMPREHENSIVE METABOLIC PANEL
ALT: 39 U/L (ref 0–44)
AST: 28 U/L (ref 15–41)
Albumin: 3.4 g/dL — ABNORMAL LOW (ref 3.5–5.0)
Alkaline Phosphatase: 122 U/L (ref 38–126)
Anion gap: 10 (ref 5–15)
BUN: 13 mg/dL (ref 6–20)
CO2: 28 mmol/L (ref 22–32)
Calcium: 9.7 mg/dL (ref 8.9–10.3)
Chloride: 103 mmol/L (ref 98–111)
Creatinine, Ser: 1.02 mg/dL — ABNORMAL HIGH (ref 0.44–1.00)
GFR calc Af Amer: >60 mL/min (ref >60)
GFR calc non Af Amer: >60 mL/min (ref >60)
Glucose, Bld: 121 mg/dL — ABNORMAL HIGH (ref 70–99)
Potassium: 3.9 mmol/L (ref 3.5–5.1)
Sodium: 141 mmol/L (ref 135–145)
Total Bilirubin: <0.2 mg/dL — ABNORMAL LOW (ref 0.3–1.2)
Total Protein: 8.1 g/dL (ref 6.5–8.1)

## 2018-06-06 LAB — RETICULOCYTES
RBC.: 4.17 MIL/uL (ref 3.70–5.45)
Retic Count, Absolute: 83.4 10*3/uL (ref 33.7–90.7)
Retic Ct Pct: 2 % (ref 0.7–2.1)

## 2018-06-06 LAB — FOLATE: Folate: 52 ng/mL (ref >5.9)

## 2018-06-06 LAB — VITAMIN B12: Vitamin B-12: 371 pg/mL (ref 180–914)

## 2018-06-06 LAB — LACTATE DEHYDROGENASE: LDH: 191 U/L (ref 98–192)

## 2018-06-06 LAB — DIRECT ANTIGLOBULIN TEST (NOT AT ARMC)
DAT, IgG: NEGATIVE
DAT, complement: NEGATIVE

## 2018-06-06 LAB — C-REACTIVE PROTEIN: CRP: 4.4 mg/dL — ABNORMAL HIGH (ref <1.0)

## 2018-06-06 LAB — FERRITIN: Ferritin: 17 ng/mL (ref 11–307)

## 2018-06-06 NOTE — Patient Instructions (Signed)
June 06, 2018 We discussed in detail the meaning and significance of anemia (low hemoglobin/red blood cells).  We mentioned also that your platelet count and white blood cell count have been elevated intermittently in the distant past.  The potential causes are many.  At present, only iron deficiency is identifiable.  The laboratory studies done previously confirm the presence of iron deficiency anemia.  A battery of laboratory studies are performed today to exclude any potential confounding factors contributing to your anemia and elevated platelets and white blood cell count.  Those results are not available at present but will be reviewed in detail at the time of your next visit.  It is anticipated that iron replacement will be done intravenously once all of the lab work is reviewed.  It is obvious that the iron you are taking at present is not adequate.  You are scheduled now for return visit on October 14.  Please do not hesitate to call should any questions or problems arise in the interim.

## 2018-06-06 NOTE — Telephone Encounter (Signed)
Printed avs and calender of upcoming appointment. Per 9/25 los 

## 2018-06-06 NOTE — Telephone Encounter (Signed)
Per 9/25 los.  Called patient with next appt, 10/14 @ 10:30.  Mailed calendar to patient.

## 2018-06-07 LAB — ERYTHROPOIETIN: Erythropoietin: 59 m[IU]/mL — ABNORMAL HIGH (ref 2.6–18.5)

## 2018-06-07 LAB — URINE CULTURE: Culture: 70000 — AB

## 2018-06-07 LAB — PROTEIN ELECTROPHORESIS, SERUM
A/G Ratio: 0.8 (ref 0.7–1.7)
Albumin ELP: 3.4 g/dL (ref 2.9–4.4)
Alpha-1-Globulin: 0.3 g/dL (ref 0.0–0.4)
Alpha-2-Globulin: 1 g/dL (ref 0.4–1.0)
Beta Globulin: 1.2 g/dL (ref 0.7–1.3)
Gamma Globulin: 1.6 g/dL (ref 0.4–1.8)
Globulin, Total: 4.1 g/dL — ABNORMAL HIGH (ref 2.2–3.9)
Total Protein ELP: 7.5 g/dL (ref 6.0–8.5)

## 2018-06-07 LAB — ANA: Anti Nuclear Antibody(ANA): NEGATIVE

## 2018-06-07 LAB — IGG, IGA, IGM
IgA: 383 mg/dL — ABNORMAL HIGH (ref 87–352)
IgG (Immunoglobin G), Serum: 1541 mg/dL (ref 700–1600)
IgM (Immunoglobulin M), Srm: 133 mg/dL (ref 26–217)

## 2018-06-07 LAB — KAPPA/LAMBDA LIGHT CHAINS
Kappa free light chain: 34 mg/L — ABNORMAL HIGH (ref 3.3–19.4)
Kappa, lambda light chain ratio: 1.22 (ref 0.26–1.65)
Lambda free light chains: 27.8 mg/L — ABNORMAL HIGH (ref 5.7–26.3)

## 2018-06-07 LAB — PATHOLOGIST SMEAR REVIEW

## 2018-06-08 ENCOUNTER — Telehealth: Payer: Self-pay | Admitting: Pulmonary Disease

## 2018-06-08 ENCOUNTER — Ambulatory Visit: Payer: Medicaid Other | Admitting: Pulmonary Disease

## 2018-06-08 MED ORDER — PREDNISONE 1 MG PO TABS: 4.0000 mg | ORAL_TABLET | Freq: Every day | ORAL | 2 refills | Status: DC

## 2018-06-08 NOTE — Progress Notes (Deleted)
Subjective:    Patient ID: Raven Turner, female    DOB: 03-04-1971, 47 y.o.   MRN: [id number]  Synopsis: Former patient of Dr. Melvyn Novas with recurrent wheeze, shortness of breath and cough felt to be due to either vocal cord dysfunction or asthma.  Evaluated by [facility] in 2017, no paradoxical movements of the vocal cords noted   HPI No chief complaint on file.  ***  Past Medical History:  Diagnosis Date  . Anemia   . Asthma   . Bilateral hand numbness 06/25/2015  . Bone pain 02/27/2015   Diffuse bone pain in legs mostly but also in arms    . Breast pain, left 03/27/2015  . Callus of heel 12/16/2014  . Compression fracture   . Compression fracture of L1 lumbar vertebra (HCC) 08/26/2014  . Compression fracture of T12 vertebra ([place]) 08/28/2014  . COPD (chronic obstructive pulmonary disease) ([place]) 2013  . Cushing syndrome ([place]) 2013   . Depression   . Diabetes ([place]) 2013   . Diabetes mellitus type 2, uncontrolled ([place]) 08/26/2014   Sees Dr. Katy Fitch.  Last visit 11/27/14.  No diabetic retinopathy    . Diabetes mellitus with neuropathy ([place])   . Diastolic dysfunction without heart failure 10/14/2014   Grade 2 diastolic dysfunction 2/2 pulm HTN   . Generalized anxiety disorder 03/27/2015  . HTN (hypertension) 2005   . Hx of cataract surgery 11/03/2014  . LUQ abdominal pain 07/17/2015  . Medication management 05/12/2015  . Morbid obesity ([place]) 09/08/2014  . 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 Non-English speaking patient 09/08/2014  . OSA (obstructive sleep apnea) 05/20/2015  . Osteopenia 11/19/2013   Dx with osteopenia in lumbar vertebra with partial compression of L1  . Pain due to onychomycosis of toenail 03/27/2015  . Secondary Cushing's syndrome/ iatrogenic   . Skin rash 07/17/2015  . Tachycardia 01/13/2015  . Upper airway cough syndrome 09/05/2014   Followed in Pulmonary clinic/  [facility]/ [facility]  - Trial off advair      09/05/2014 >>>  - trial off  theoph 09/17/2014 and added flutter  - rec reduce dulera to 100 2 bid 07/13/2015     . Vision changes 06/25/2015  . Vitamin D deficiency 08/28/2014        Review of Systems  Constitutional: Negative for chills, fatigue and fever.  HENT: Positive for rhinorrhea. Negative for postnasal drip and sinus pressure.   Respiratory: Positive for cough, shortness of breath and wheezing.   Cardiovascular: Negative for chest pain, palpitations and leg swelling.  Hematological: Bruises/bleeds easily: .dbmfu.       Objective:   Physical Exam There were no vitals filed for this visit.RA  ***  PFT 11/03/14  PFTs p am dulera 100 on 30 mg prednisone  FEV1 1.56(73%) ratio 85 =  No obst, including fef 25-75%, only abn is reduction in ERV spirometry 03/31/15 1.48 (62%) ratio 82  p am dulera and while symptomatic requesting saba  Labs July 2017 IgE 55 (normal), CBC without eosinophils February 2019 CBC with differential showed 400 eosinophils per deciliter  Chest imaging: March 2019 high-resolution CT scan of the chest images independently reviewed showing some mild air trapping but no interstitial lung disease.      Assessment & Plan:  No diagnosis found.  Discussion: ***  Current Outpatient Medications:  .  albuterol (PROVENTIL HFA;VENTOLIN HFA) 108 (90 Base) MCG/ACT inhaler, Inhale 2 puffs into the lungs every 4 (four) hours as needed for wheezing or  shortness of breath (((PLAN B))). (Patient taking differently: Inhale 2 puffs into the lungs every 4 (four) hours as needed for wheezing or shortness of breath. ), Disp: 18 g, Rfl: 6 .  albuterol (PROVENTIL) (2.5 MG/3ML) 0.083% nebulizer solution, USE 1 VIAL VIA NEBULIZER EVERY 4 HOURS AS NEEDED FOR WHEEZING OR SHORTNESS OF BREATH (Patient taking differently: Take 2.5 mg by nebulization every 4 (four) hours as needed for wheezing or shortness of breath. ), Disp: 90 mL, Rfl: 0 .  aspirin EC 81 MG tablet, Take 1 tablet (81 mg total) by mouth daily.,  Disp: 90 tablet, Rfl: 3 .  atorvastatin (LIPITOR) 40 MG tablet, Take 1 tablet (40 mg total) by mouth daily., Disp: 90 tablet, Rfl: 3 .  Blood Glucose Monitoring Suppl (ACCU-CHEK AVIVA PLUS) W/DEVICE KIT, Used as directed, Disp: 1 kit, Rfl: 0 .  buprenorphine (BUTRANS - DOSED MCG/HR) 5 MCG/HR PTWK patch, Place 5 mcg onto the skin once a week., Disp: , Rfl:  .  cabergoline (DOSTINEX) 0.5 MG tablet, Take 0.5 tablets (0.25 mg total) by mouth 2 (two) times a week., Disp: 10 tablet, Rfl: 3 .  cholecalciferol (VITAMIN D) 1000 UNITS tablet, Take 1,000 Units by mouth every morning., Disp: , Rfl:  .  cloNIDine (CATAPRES) 0.2 MG tablet, TAKE 1 TABLET(0.2 MG) BY MOUTH THREE TIMES DAILY, Disp: 90 tablet, Rfl: 2 .  DULERA 200-5 MCG/ACT AERO, INHALE 2 PUFFS INTO THE LUNGS TWICE DAILY, Disp: 13 g, Rfl: 0 .  DUPIXENT 300 MG/2ML SOSY, Inject 300 mg into the skin See admin instructions. Inject under skin every 15 days, Disp: , Rfl: 6 .  ferrous sulfate 325 (65 FE) MG tablet, Take 1 tablet (325 mg total) by mouth daily., Disp: 30 tablet, Rfl: 0 .  furosemide (LASIX) 20 MG tablet, Take 1 tablet (20 mg total) by mouth every morning., Disp: 30 tablet, Rfl: 2 .  gabapentin (NEURONTIN) 400 MG capsule, Take 2 capsules (800 mg total) by mouth 3 (three) times daily., Disp: 180 capsule, Rfl: 3 .  glucose blood (RELION GLUCOSE TEST STRIPS) test strip, 1 each by Other route 4 (four) times daily. ICD code E11.65, Disp: 400 each, Rfl: 3 .  HUMULIN R U-500 KWIKPEN 500 UNIT/ML kwikpen, Inject 120-180 Units into the skin 3 (three) times daily. 180 U before breakfast, 160 U before lunch, 140 U before dinner, Disp: 27 mL, Rfl: 11 .  hydrOXYzine (ATARAX/VISTARIL) 25 MG tablet, TAKE 1 TABLET BY MOUTH AT BEDTIME AS NEEDED FOR ANXIETY. (Patient taking differently: Take 25 mg by mouth at bedtime as needed for anxiety. ), Disp: 30 tablet, Rfl: 6 .  Insulin Pen Needle (B-D ULTRAFINE III SHORT PEN) 31G X 8 MM MISC, 1 application by Does not apply  route daily., Disp: 100 each, Rfl: 3 .  Insulin Syringe-Needle U-100 (BD INSULIN SYRINGE ULTRAFINE) 31G X 15/64" 1 ML MISC, 1 each by Does not apply route 3 (three) times daily., Disp: 300 each, Rfl: 3 .  Lancet Devices (ACCU-CHEK SOFTCLIX) lancets, Use as instructed, Disp: 1 each, Rfl: 0 .  levothyroxine (SYNTHROID, LEVOTHROID) 25 MCG tablet, Take 25 mcg by mouth daily., Disp: , Rfl: 3 .  losartan (COZAAR) 100 MG tablet, Take 1 tablet (100 mg total) by mouth every morning., Disp: 30 tablet, Rfl: 6 .  metFORMIN (GLUCOPHAGE) 1000 MG tablet, TAKE 1 TABLET BY MOUTH TWICE DAILY WITH A MEAL (Patient taking differently: Take 1,000 mg by mouth 2 (two) times daily with a meal. ), Disp: 60 tablet, Rfl:  2 .  methocarbamol (ROBAXIN) 750 MG tablet, Take 1 tablet (750 mg total) by mouth 3 (three) times daily., Disp: 60 tablet, Rfl: 0 .  Misc. Devices (CANE) MISC, 1 each by Does not apply route daily., Disp: 1 each, Rfl: 0 .  montelukast (SINGULAIR) 10 MG tablet, Take 1 tablet (10 mg total) by mouth at bedtime., Disp: 30 tablet, Rfl: 6 .  omeprazole (PRILOSEC) 20 MG capsule, TAKE ONE CAPSULE BY MOUTH EVERY DAY BEFORE BREAKFAST (Patient taking differently: Take 20 mg by mouth daily. BEFORE BREAKFAST), Disp: 30 capsule, Rfl: 6 .  Oyster Shell Calcium 500 MG TABS, Take 1 tablet (1,250 mg total) by mouth daily., Disp: 90 tablet, Rfl: 3 .  PARoxetine (PAXIL) 20 MG tablet, Take 1 tablet (20 mg total) by mouth daily., Disp: 30 tablet, Rfl: 6 .  predniSONE (DELTASONE) 1 MG tablet, 4 tabs (72m) daily. (Patient taking differently: Take 4 mg by mouth daily. ), Disp: 120 tablet, Rfl: 1 .  ranitidine (ZANTAC) 300 MG tablet, Take 1 tablet (300 mg total) by mouth at bedtime., Disp: 30 tablet, Rfl: 5 .  ReliOn Ultra Thin Lancets MISC, 1 each by Does not apply route 4 (four) times daily. ICD code E11.65, Disp: 400 each, Rfl: 3 .  Respiratory Therapy Supplies (FLUTTER) DEVI, Use as directed, Disp: 1 each, Rfl: 0 .   Spacer/Aero-Holding Chambers (AEROCHAMBER MV) inhaler, Use as instructed, Disp: 1 each, Rfl: 0 .  spironolactone (ALDACTONE) 25 MG tablet, Take 0.5 tablets (12.5 mg total) by mouth daily., Disp: 45 tablet, Rfl: 3 .  ULTICARE MICRO PEN NEEDLES 32G X 4 MM MISC, USE AS DIRECTED, Disp: 100 each, Rfl: 2 .  verapamil (CALAN-SR) 180 MG CR tablet, Take 2 tablets (360 mg total) by mouth at bedtime., Disp: 180 tablet, Rfl: 2  Current Facility-Administered Medications:  .  betamethasone acetate-betamethasone sodium phosphate (CELESTONE) injection 3 mg, 3 mg, Intramuscular, Once, Evans, Brent M, DPM .  umeclidinium bromide (INCRUSE ELLIPTA) 62.5 MCG/INH 1 puff, 1 puff, Inhalation, Daily, JLadell Pier MD  Facility-Administered Medications Ordered in Other Visits:  .  DOBUTamine (DOBUTREX) 1,000 mcg/mL in dextrose 5% 250 mL infusion, 30 mcg/kg/min, Intravenous, Continuous, Croitoru, Mihai, MD, Last Rate: 145.3 mL/hr at 04/22/15 1300, 30 mcg/kg/min at 04/22/15 1300

## 2018-06-08 NOTE — Telephone Encounter (Signed)
Spoke with pt. She is needing a refill on Prednisone 68m. Rx has been sent in. Nothing further was needed.

## 2018-06-11 ENCOUNTER — Other Ambulatory Visit: Payer: Self-pay | Admitting: Hematology and Oncology

## 2018-06-11 ENCOUNTER — Other Ambulatory Visit: Payer: Self-pay | Admitting: *Deleted

## 2018-06-11 ENCOUNTER — Telehealth: Payer: Self-pay | Admitting: *Deleted

## 2018-06-11 ENCOUNTER — Ambulatory Visit: Payer: Medicaid Other | Admitting: Podiatry

## 2018-06-11 DIAGNOSIS — M1990 Unspecified osteoarthritis, unspecified site: Secondary | ICD-10-CM

## 2018-06-11 DIAGNOSIS — L600 Ingrowing nail: Secondary | ICD-10-CM

## 2018-06-11 DIAGNOSIS — D500 Iron deficiency anemia secondary to blood loss (chronic): Secondary | ICD-10-CM

## 2018-06-11 DIAGNOSIS — D6489 Other specified anemias: Secondary | ICD-10-CM

## 2018-06-11 DIAGNOSIS — N3000 Acute cystitis without hematuria: Secondary | ICD-10-CM

## 2018-06-11 LAB — HEMOGLOBINOPATHY EVALUATION
Hgb A2 Quant: 1.9 % (ref 1.8–3.2)
Hgb A: 98.1 % (ref 96.4–98.8)
Hgb C: 0 %
Hgb F Quant: 0 % (ref 0.0–2.0)
Hgb S Quant: 0 %
Hgb Variant: 0 %

## 2018-06-11 MED ORDER — CEPHALEXIN 500 MG PO CAPS: 500.0000 mg | ORAL_CAPSULE | Freq: Two times a day (BID) | ORAL | 0 refills | Status: DC

## 2018-06-11 MED ORDER — CEPHALEXIN 500 MG PO CAPS: 500.0000 mg | ORAL_CAPSULE | Freq: Two times a day (BID) | ORAL | 0 refills | Status: AC

## 2018-06-11 MED ORDER — GENTAMICIN SULFATE 0.1 % EX CREA: 1.0000 "application " | TOPICAL_CREAM | Freq: Two times a day (BID) | CUTANEOUS | 1 refills | Status: DC

## 2018-06-11 NOTE — Progress Notes (Signed)
Subjective: Patient presents today for evaluation of pain to the lateral border of the left great toe. Patient is concerned for possible ingrown nail. Patient presents today for further treatment and evaluation.  Patient recently finished oral doxycycline as prescribed by Dr. Prudence Davidson on last visit on 05/15/2018.  Past Medical History:  Diagnosis Date  . Anemia   . Asthma   . Bilateral hand numbness 06/25/2015  . Bone pain 02/27/2015   Diffuse bone pain in legs mostly but also in arms    . Breast pain, left 03/27/2015  . Callus of heel 12/16/2014  . Compression fracture   . Compression fracture of L1 lumbar vertebra (HCC) 08/26/2014  . Compression fracture of T12 vertebra ([place]) 08/28/2014  . COPD (chronic obstructive pulmonary disease) ([place]) 2013  . Cushing syndrome ([place]) 2013   . Depression   . Diabetes ([place]) 2013   . Diabetes mellitus type 2, uncontrolled ([place]) 08/26/2014   Sees Dr. Katy Fitch.  Last visit 11/27/14.  No diabetic retinopathy    . Diabetes mellitus with neuropathy ([place])   . Diastolic dysfunction without heart failure 10/14/2014   Grade 2 diastolic dysfunction 2/2 pulm HTN   . Generalized anxiety disorder 03/27/2015  . HTN (hypertension) 2005   . Hx of cataract surgery 11/03/2014  . LUQ abdominal pain 07/17/2015  . Medication management 05/12/2015  . Morbid obesity ([place]) 09/08/2014  . 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 Non-English speaking patient 09/08/2014  . OSA (obstructive sleep apnea) 05/20/2015  . Osteopenia 11/19/2013   Dx with osteopenia in lumbar vertebra with partial compression of L1  . Pain due to onychomycosis of toenail 03/27/2015  . Secondary Cushing's syndrome/ iatrogenic   . Skin rash 07/17/2015  . Tachycardia 01/13/2015  . Upper airway cough syndrome 09/05/2014   Followed in Pulmonary clinic/  Health[facility]/ [facility]  - Trial off advair      09/05/2014 >>>  - trial off theoph 09/17/2014 and added flutter  - rec reduce dulera to 100 2 bid 07/13/2015     . Vision changes  06/25/2015  . Vitamin D deficiency 08/28/2014    Objective:  General: Well developed, nourished, in no acute distress, alert and oriented x3   Dermatology: Skin is warm, dry and supple bilateral.  Lateral border of the left great toe appears to be erythematous with evidence of an ingrowing nail. Pain on palpation noted to the border of the nail fold. The remaining nails appear unremarkable at this time. There are no open sores, lesions.  Vascular: Dorsalis Pedis artery and Posterior Tibial artery pedal pulses palpable. No lower extremity edema noted.   Neruologic: Grossly intact via light touch bilateral.  Musculoskeletal: Muscular strength within normal limits in all groups bilateral. Normal range of motion noted to all pedal and ankle joints.   Assesement: #1 Paronychia with ingrowing nail lateral border left great toe #2 Pain in toe #3 Incurvated nail  Plan of Care:  1. Patient evaluated.  2. Discussed treatment alternatives and plan of care. Explained nail avulsion procedure and post procedure course to patient. 3. Patient opted for permanent partial nail avulsion.  4. Prior to procedure, local anesthesia infiltration utilized using 3 ml of a 50:50 mixture of 2% plain lidocaine and 0.5% plain marcaine in a normal hallux block fashion and a betadine prep performed.  5. Partial permanent nail avulsion with chemical matrixectomy performed using [id number] applications of phenol followed by alcohol flush.  6. Light dressing applied. 7.  Prescription for gentamicin cream to be applied  2 times daily  8.  Return to clinic in 2 weeks.   Edrick Kins, DPM [facility]  Dr. Edrick Kins, [place]                                        [place],  [address]                Office [phone]  Fax [phone]

## 2018-06-11 NOTE — Telephone Encounter (Signed)
Pt notified of message below via interpreter, Almyra Free. Verbalized understanding

## 2018-06-11 NOTE — Telephone Encounter (Signed)
-----   Message from Henreitta Leber, MD sent at 06/11/2018  9:42 AM EDT ----- Regarding: UTI; needs a prescription and phone call. Hello again Deliana Avalos, This patient understands only very little [place]. She has group B strep in her urine. She is diabetic and without GU symptoms.  Her CRP is very high. I want to call in the prescription for cephalexin: 500 mg twice daily for 5 days.  I will give her only 10 capsules, without refills.  She is not penicillin allergic. Please inform her.  Telephone number [phone]. I will write the prescription.  It is my first time so I may need some coaching!  I am a willing student!! She is scheduled to return on October 14.  I put in lab orders for that particular day.  Let her know to get blood work before I see her on October 14. Thanks Shanice Poznanski. Sorry to bother you with these things. As usual, if she needs to talk to me I willing to give her a call.

## 2018-06-11 NOTE — Patient Instructions (Signed)

## 2018-06-20 ENCOUNTER — Other Ambulatory Visit: Payer: Self-pay | Admitting: Internal Medicine

## 2018-06-20 ENCOUNTER — Other Ambulatory Visit: Payer: Self-pay | Admitting: Family Medicine

## 2018-06-20 DIAGNOSIS — Z1231 Encounter for screening mammogram for malignant neoplasm of breast: Secondary | ICD-10-CM

## 2018-06-20 LAB — JAK2 (INCLUDING V617F AND EXON 12), MPL,& CALR W/RFL MPN PANEL (NGS)

## 2018-06-20 LAB — BCR ABL1 FISH (GENPATH)

## 2018-06-23 NOTE — Progress Notes (Signed)
[facility] Outpatient Progress Note  Patient Name:  Raven Turner  DOB: Dec 18, 1970   Date of Service: June 25, 2018  Referring Provider: Charlott Rakes, MD  [place], [place] [address]    Consulting Physician: Henreitta Leber, MD Hematology/Oncology  Reason for Visit: In the setting of long-standing anemia over a lengthy, but indeterminate period of time secondary to iron deficiency, associated with persistent thrombocytosis and leukocytosis of unclear etiology, she presents now for the results of her preliminary laboratory evaluation and recommendations.  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  She understands and speaks a very minimal amount of [place].  She checks her sugar with glucometer 4 times daily.  She is accompanied by her husband who is a speaks [place].  [name] has been aware of her anemia for many years.  She has been taking iron supplements for at least the past year consisting at present, of ferrous sulfate: 325 mg once daily.  While presenting to the emergency department at [facility] on May 12, 2018 a complete blood count showed hemoglobin 7.9  hematocrit 26.8 MCV 71.8 MCH 21.2 RDW 18.1; platelets 534,000.  On an outpatient basis on June 01, 2018 complete blood count showed hemoglobin 8.9 hematocrit 29.7 MCV 69 MCH 20.7 RDW 19.9 WBC 16.4; platelets 568,000.  A look back much further into her medical record from August 26, 2014 a complete blood count showed hemoglobin 13.0 hematocrit 38.8 MCV 78 MCH 26 RDW 16.5 WBC 14.7; platelets 444,000.  On September 08, 2014 an HIV antibody was negative.  On September 15, 2014 a complete blood count showed hemoglobin 13.4 hematocrit 39.4 WBC 18.5 with 90% neutrophils 6% lymphocytes 4% monocytes; platelets 441,000.  On September 13, 2015 a complete blood count showed hemoglobin 13.0 hematocrit 39.8 WBC 11.1; platelets 453,000.  More recently on October 11, 2017 a complete blood count showed hemoglobin 10.3 hematocrit 32.0 MCV 75 RDW 16.9 WBC 12.1 with 66% neutrophils 23% lymphocytes 9% monocytes 2% eosinophils 1% basophil; platelets 453,000.  Prior to today's visit her most recent complete blood count shows hemoglobin 8.9 hematocrit 29.7 MCV 69 MCH 20.7 RDW 19.9 WBC 16.4; platelets 568,000.    She was hospitalized in 2015 for bronchitis.  In 2016 she was hospitalized for cholelithiasis.  She has chronic degenerative joint disease involving the neck, lower back, fingers, and knees.  She has compression fractures involving both the thoracic and lumbar spine. The results of her CT imaging from November 24, 2017 suggests extensive coronary artery disease/calcification.    At the time of her initial visit, we discussed also the importance of identifying a cause or causes of her underlying anemia, thrombocytosis, and leukocytosis.  Not infrequently iron deficiency anemia is associated with a reactive thrombocytosis.  If the anemia and the iron deficiency is corrected, generally the thrombosis self-corrects. At the time of her initial visit on September 25, a battery of laboratory studies were obtained  to exclude any  additional contributing factors to her anemia such as an underlying hemolytic process, paraproteinemia, myeloproliferative disorder, nutritional deficit, metabolic explanation, or autoimmune phenomenon.    Urine was obtained for urinalysis and urine culture and sensitivity.  Her urine culture was positive for group B Streptococcus (70,000 per high-power field).  She was started on cephalexin 500 mg twice daily for 5 days.  We discussed the possibility of having a diagnostic colonoscopy, which she has never had.  Given her complex history, a colonoscopy is warranted due to her protracted iron deficiency anemia.  She had an esophagogastroduodenoscopy 2 years earlier which failed to reveal any evidence of active bleeding. It is with this background she presents now for the results of her preliminary laboratory evaluation with recommendations in the setting of long-standing anemia of iron deficiency with likely reactive thrombocytosis as outlined above.  Interval History: In the interim since she reports no new problems or complaints. Her weight has increased 20 pounds over the past 2 months. Her energy level is chronically low.  He complains of frequent episodes of dizziness and lightheadedness without vertigo.  She denies headache syncope or near syncopal episodes.  She reports no unusual cough, sore throat, or orthopnea.  She has chronic but stable exertional dyspnea unchanged over her usual baseline.  She denies pain or difficulty in swallowing. She has no fever, shaking chills, sweats, or flulike symptoms.  She denies heartburn or indigestion. She has no nausea, vomiting, diarrhea, or constipation.  She moves her bowels once daily.  Her dyspepsia is controlled with protein pump inhibitor therapy.  She reports no melena or bright red blood per rectum.  Her ankles swell slightly during the daytime and received overnight.  She complains of paresthesias involving the hands bilaterally.  There are no new focal  areas of bone, joint, muscle pain.   Past Medical History Reviewed        Family History Reviewed       Social History Reviewed :  Allergies  Allergen Reactions  . Shellfish Allergy Anaphylaxis  . Septra [Sulfamethoxazole-Trimethoprim] Rash, Other (See Comments) and Itching    Facial redness with pruritus  Facial redness with pruritus   . Hydrocortisone Rash  No seasonal allergies   Current Outpatient Medications on File Prior to Visit  Medication Sig  . albuterol (PROVENTIL HFA;VENTOLIN HFA) 108 (90 Base) MCG/ACT inhaler Inhale 2 puffs into the lungs every 4 (four) hours as needed for wheezing or shortness of breath (((PLAN B))). (Patient taking differently: Inhale 2 puffs into the lungs every 4 (four) hours as needed for wheezing or shortness of breath. )  . albuterol (PROVENTIL) (2.5 MG/3ML) 0.083% nebulizer solution USE 1 VIAL VIA NEBULIZER EVERY 4 HOURS AS NEEDED FOR WHEEZING OR SHORTNESS OF BREATH (Patient taking differently: Take 2.5 mg by nebulization every 4 (four) hours as needed for wheezing or shortness of breath. )  . aspirin EC 81 MG tablet Take 1 tablet (81 mg total) by mouth daily.  Marland Kitchen atorvastatin (LIPITOR) 40 MG tablet Take 1 tablet (40 mg total) by mouth daily.  . Blood Glucose Monitoring Suppl (ACCU-CHEK AVIVA PLUS) W/DEVICE KIT Used as directed  . buprenorphine (BUTRANS - DOSED MCG/HR) 5 MCG/HR PTWK patch Place 5 mcg onto the skin once a week.  . cabergoline (DOSTINEX) 0.5 MG tablet Take 0.5 tablets (0.25 mg total) by mouth 2 (two) times a week.  . cholecalciferol (VITAMIN D) 1000 UNITS tablet Take 1,000 Units by mouth every morning.  . cloNIDine (CATAPRES)  0.2 MG tablet TAKE 1 TABLET(0.2 MG) BY MOUTH THREE TIMES DAILY  . DULERA 200-5 MCG/ACT AERO INHALE 2 PUFFS INTO THE LUNGS TWICE DAILY  . DUPIXENT 300 MG/2ML SOSY Inject 300 mg into the skin See admin instructions. Inject under skin every 15 days  . ferrous sulfate 325 (65 FE) MG tablet Take 1 tablet (325 mg total) by  mouth daily.  . furosemide (LASIX) 20 MG tablet Take 1 tablet (20 mg total) by mouth every morning.  . gabapentin (NEURONTIN) 400 MG capsule Take 2 capsules (800 mg total) by mouth 3 (three) times daily.  Marland Kitchen gentamicin cream (GARAMYCIN) 0.1 % Apply 1 application topically 2 (two) times daily.  Marland Kitchen glucose blood (RELION GLUCOSE TEST STRIPS) test strip 1 each by Other route 4 (four) times daily. ICD code E11.65  . HUMULIN R U-500 KWIKPEN 500 UNIT/ML kwikpen Inject 120-180 Units into the skin 3 (three) times daily. 180 U before breakfast, 160 U before lunch, 140 U before dinner  . hydrOXYzine (ATARAX/VISTARIL) 25 MG tablet TAKE 1 TABLET BY MOUTH AT BEDTIME AS NEEDED FOR ANXIETY. (Patient taking differently: Take 25 mg by mouth at bedtime as needed for anxiety. )  . Insulin Pen Needle (B-D ULTRAFINE III SHORT PEN) 31G X 8 MM MISC 1 application by Does not apply route daily.  . Insulin Syringe-Needle U-100 (BD INSULIN SYRINGE ULTRAFINE) 31G X 15/64" 1 ML MISC 1 each by Does not apply route 3 (three) times daily.  Elmore Guise Devices (ACCU-CHEK SOFTCLIX) lancets Use as instructed  . levothyroxine (SYNTHROID, LEVOTHROID) 25 MCG tablet Take 25 mcg by mouth daily.  Marland Kitchen losartan (COZAAR) 100 MG tablet Take 1 tablet (100 mg total) by mouth every morning.  . metFORMIN (GLUCOPHAGE) 1000 MG tablet TAKE 1 TABLET BY MOUTH TWICE DAILY WITH A MEAL (Patient taking differently: Take 1,000 mg by mouth 2 (two) times daily with a meal. )  . methocarbamol (ROBAXIN) 750 MG tablet Take 1 tablet (750 mg total) by mouth 3 (three) times daily.  . Misc. Devices (CANE) MISC 1 each by Does not apply route daily.  . montelukast (SINGULAIR) 10 MG tablet Take 1 tablet (10 mg total) by mouth at bedtime.  Marland Kitchen omeprazole (PRILOSEC) 20 MG capsule TAKE ONE CAPSULE BY MOUTH EVERY DAY BEFORE BREAKFAST (Patient taking differently: Take 20 mg by mouth daily. BEFORE BREAKFAST)  . Oyster Shell Calcium 500 MG TABS Take 1 tablet (1,250 mg total) by mouth  daily.  Marland Kitchen PARoxetine (PAXIL) 20 MG tablet Take 1 tablet (20 mg total) by mouth daily.  . predniSONE (DELTASONE) 1 MG tablet Take 4 tablets (4 mg total) by mouth daily.  . ranitidine (ZANTAC) 300 MG tablet Take 1 tablet (300 mg total) by mouth at bedtime.  . ReliOn Ultra Thin Lancets MISC 1 each by Does not apply route 4 (four) times daily. ICD code E11.65  . Respiratory Therapy Supplies (FLUTTER) DEVI Use as directed  . Spacer/Aero-Holding Chambers (AEROCHAMBER MV) inhaler Use as instructed  . spironolactone (ALDACTONE) 25 MG tablet Take 0.5 tablets (12.5 mg total) by mouth daily.  Marland Kitchen ULTICARE MICRO PEN NEEDLES 32G X 4 MM MISC USE AS DIRECTED  . verapamil (CALAN-SR) 180 MG CR tablet Take 2 tablets (360 mg total) by mouth at bedtime.   Current Facility-Administered Medications on File Prior to Visit  Medication  . betamethasone acetate-betamethasone sodium phosphate (CELESTONE) injection 3 mg  . DOBUTamine (DOBUTREX) 1,000 mcg/mL in dextrose 5% 250 mL infusion  . umeclidinium bromide (INCRUSE ELLIPTA) 62.5  MCG/INH 1 puff    Review of Systems: Constitutional: No fever, sweats, or shaking chills.  No appetite or weight deficit.  She has a 20 pound weight loss over the past 18 months.  BMI is elevated.  Energy level is low; hydroxyzine to help sleep. Skin: No rash, scaling, sores, lumps, or jaundice. HEENT: No visual changes or hearing deficit; no macular degeneration or glaucoma. Pulmonary: No unusual cough, sore throat, or orthopnea; obstructive sleep apnea; bronchial asthma; DOE. Breasts: Fibrocystic breast disease. Cardiovascular: Coronary artery disease; no angina or myocardial infarction.  No cardiac dysrhythmia; essential hypertension and dyslipidemia; occasional subjective palpitations. Gastrointestinal: No indigestion, dysphagia, abdominal pain, diarrhea, or constipation.  No nausea or vomiting.  No pain or difficulty in swallowing.  No change in bowel habits; NASH; GERD; normal brown  stool while on ferrous sulfate; EGD 2 years ago; no prior colonoscopy. Genitourinary: No urinary frequency, urgency, gross hematuria, or dysuria; previous strep UTI. Musculoskeletal: Diffuse arthralgias and myalgias including the neck, lower back, fingers/hands, and knees; no joint swelling or deformity or instability. Hematologic: No bleeding tendency or easy bruisability. Endocrine: No intolerance to heat or cold; hypothyroid disease and insulin requiring diabetes mellitus. Vascular: No peripheral arterial or venous thromboembolic disease. Psychological: Chronic anxiety; chronic depression without suicidal ideation; no new mood changes. Neurological: Frequent dizziness and lightheadedness; no headache, syncope, or near syncopal episodes; numbness or tingling in the hands. She ambulates with a cane due to her lower back compression fractures; sometimes uses a walker for distance.Marland Kitchen  Physical Examination: Vital Signs: Body surface area is 1.99 meters squared.  Vitals:   06/25/18 1118  BP: 131/65  Pulse: 67  Resp: 20  Temp: 98.4 F (36.9 C)  SpO2: 100%    Filed Weights   06/25/18 1118  Weight: 214 lb (97.1 kg)  Body mass index is 44.73 kg/m. Constitutional:  Raven Turner is fully nourished and developed albeit overweight. She looks age appropriate. She is friendly and cooperative without respiratory compromise at rest. Skin: No rashes, scaling, dryness, jaundice, or itching. HEENT: Head is normocephalic and atraumatic.  Pupils are equal round and reactive to light and accommodation.  Sclerae are anicteric.  Conjunctivae are pale.  No sinus tenderness nor oropharyngeal lesions.  Lips without cracking or peeling; tongue without mass, inflammation, or nodularity.  Mucous membranes are moist.  Dentition is poor. Neck: Supple and symmetric.  No jugular venous distention or thyromegaly.  Trachea is midline. Lymphatics: No cervical or supraclavicular lymphadenopathy.  No epitrochlear,  axillary, or inguinal lymphadenopathy is appreciated. Respiratory/chest: Thorax is symmetrical.  Breath sounds are clear to auscultation and percussion.  Normal excursion and respiratory effort. Back: Symmetric without deformity or tenderness. Cardiovascular: Heart rate and rhythm are regular without murmurs or rubs. Gastrointestinal: Abdomen is soft, obese, nontender; no organomegaly.  Bowel sounds are normoactive.  No masses are appreciated. Rectal examination: Not performed. Extremities: Her legs are thick and large, but not edematous.  In the lower extremities, there is no asymmetric swelling, erythema, tenderness, or cord formation.  No clubbing, cyanosis, nor edema. Hematologic: No petechiae, hematomas, or ecchymoses. Psychological She is oriented to person, place, and time; normal affect. Neurological: There are no gross neurologic deficits.  Laboratory Results: June 06, 2018  Ref Range & Units 2wk ago (06/06/18) 3wk ago (06/01/18) 66moago (05/12/18) 1662mogo (05/09/18) 62m81moo (11/09/17)  WBC Count 3.9 - 10.3 K/uL 14.7High   16.4High  R 13.7High  R 14.7High  R 9.5 R  RBC 3.70 - 5.45 MIL/uL  4.19  4.29 R 3.73Low  R 3.87 R 4.41 R  Hemoglobin 11.6 - 15.9 g/dL 9.0Low   8.9Low  R 7.9Low  R 7.9Low  R 10.8Low  R  HCT 34.8 - 46.6 % 29.7Low   29.7Low  R 26.8Low  R 27.0Low  R 32.7Low  R  MCV 79.5 - 101.0 fL 70.9Low   69Low  R 71.8Low  R 70Low  R 74.1Low  R  MCH 25.1 - 34.0 pg 21.5Low   20.7Low  R 21.2Low  R 20.4Low  R   MCHC 31.5 - 36.0 g/dL 30.3Low   30.0Low  R 29.5Low  R 29.3Low  R 33.0 R  RDW 11.2 - 14.5 % 21.9High   19.9High  R 18.1High  R 16.7High  R 17.0High  R  Platelet Count 145 - 400 K/uL 537High   568High  R 534High  R, CM 625High  R 439.0High  R  Neutrophils Relative % % 75    64 R 51.3 R  Neutro Abs 1.5 - 6.5 K/uL 11.1High     9.6High  R 4.9 R  Lymphocytes Relative % 18     35.7 R  Lymphs Abs 0.9 - 3.3 K/uL 2.6    3.8High  R 3.4 R  Monocytes Relative % 6     8.5 R  Monocytes  Absolute 0.1 - 0.9 K/uL 0.8     0.8 R  Eosinophils Relative % 1     3.8 R  Eosinophils Absolute 0.0 - 0.5 K/uL 0.2     0.4 R  Basophils Relative % 0     0.7 R  Basophils Absolute 0.0 - 0.1 K/uL 0.1    0.1 R 0.1   Comment: Performed at [facility], [place] [address] [place], [place] [address]   2wk ago (06/06/18) 548moago (05/12/18) 472mogo (02/09/18) 48m30moo (12/19/17) 48mo74mo (12/04/17)    Sodium 135 - 145 mmol/L 141  136  138 R 139 R 143 R  Potassium 3.5 - 5.1 mmol/L 3.9  3.9  4.4 R 4.5 R 4.1 R  Chloride 98 - 111 mmol/L 103  102  98 R 99 R 103 R  CO2 22 - 32 mmol/L 28  24  25  R 25 R 23 R  Glucose, Bld 70 - 99 mg/dL 121High   329High   128High  R 218High  R 109High  R  BUN 6 - 20 mg/dL 13  13  17  R 16 R 20 R  Creatinine, Ser 0.44 - 1.00 mg/dL 1.02High   1.02High   1.12High  R 1.05High  R 0.99 R  Calcium 8.9 - 10.3 mg/dL 9.7  8.9  9.4 R 9.4 R 9.2 R  Total Protein 6.5 - 8.1 g/dL 8.1  7.4      Albumin 3.5 - 5.0 g/dL 3.4Low   3.2Low       AST 15 - 41 U/L 28  31      ALT 0 - 44 U/L 39  34      Alkaline Phosphatase 38 - 126 U/L 122  102      Total Bilirubin 0.3 - 1.2 mg/dL <0.2Low   0.4      GFR calc non Af Amer >60 mL/min >60  >60  59Low  R 64 R 69 R  GFR calc Af Amer >60 mL/min >60  >60 CM 69 R 74 R 80 R   Path Review Reviewed By BassViolet BaldyD.   Comment: 09.26.19  MICROCYTIC ANEMIA.  LEUKOCYTOSIS, WITH NEUTROPHILIA AND MILD LEFT SHIFT.  THROMBOCYTOSIS.   Reticulocyte count 8.4% DAT negative Folic acid 66.5 Vitamin B12 371 Ferritin 17 Iron/TIBC CRP 4.4 (<1.0) LDH 191 Serum protein electrophoresis, immunofixation, and light chain analysis: There was no monoclonal M spike identified on electrophoresis. Quantitative immunoglobulins:  IgG 1541 IgA 383 (87-252) IgM 133 Kappa free light chain 34 Lambda free light chain 27.8  Kappa/lambda light chain ratio 1.22 ANA negative Hemoglobin electrophoresis Hgb A2 Quant 1.8 - 3.2 % 1.9   Hgb F Quant 0.0 -  2.0 % 0.0   Hgb S Quant 0.0 % 0.0   Hgb C 0.0 % 0.0 VC  Hgb A 96.4 - 98.8 % 98.1   Hgb Variant 0.0 % 0.0 VC  Please Note:  Comment VC  Comment: (NOTE)  Normal adult hemoglobin present.   JAK2 V617F, CALR and MPL: Not identified BCR-ABL1 translocation by [facility] t(9;22): Not identified  Diagnostic/Imaging Studies: November 24, 2017 CT CHEST WITHOUT CONTRAST  COMPARISON: No priors.  FINDINGS: Cardiovascular: Heart size is borderline enlarged. There is no significant pericardial fluid, thickening or pericardial calcification. There is aortic atherosclerosis, as well as atherosclerosis of the great vessels of the mediastinum and the coronary arteries, including calcified atherosclerotic plaque in the left anterior descending coronary artery.  Mediastinum/Nodes: No pathologically enlarged mediastinal or hilar lymph nodes. Please note that accurate exclusion of hilar adenopathy is limited on noncontrast CT scans. Esophagus is unremarkable in appearance. No axillary lymphadenopathy.  Lungs/Pleura: High-resolution images demonstrate no significant regions of ground-glass attenuation, subpleural reticulation, parenchymal banding, traction bronchiectasis or frank honeycombing. Inspiratory and expiratory imaging demonstrates mild air trapping indicative of small airways disease. No acute consolidative airspace disease. No pleural effusions. No suspicious appearing pulmonary nodules or masses.  Upper Abdomen: Visualized portions of the upper abdomen demonstrate diffuse low attenuation throughout the visualized portions of the hepatic parenchyma, indicative of hepatic steatosis.  Musculoskeletal: Several chronic appearing compression fractures of inferior endplate of T3, superior endplate of T6, T7 vertebral body, superior endplate of T8, superior and inferior endplates of T9 and superior and inferior endplates of L93, most severe at T9 where there is 60% loss of central vertebral  body height. Multiple old healed bilateral rib fractures. There are no aggressive appearing lytic or blastic lesions noted in the visualized portions of the skeleton.  IMPRESSION: 1. No findings to suggest interstitial lung disease. 2. Mild air trapping indicative of mild small airways disease. 3. Aortic atherosclerosis, in addition to left anterior descending coronary artery disease. Please note that although the presence of coronary artery calcium documents the presence of coronary artery disease, the severity of this disease and any potential stenosis cannot be assessed on this non-gated CT examination. Assessment for potential risk factor modification, dietary therapy or pharmacologic therapy may be warranted, if clinically indicated. 4. Hepatic steatosis. 5. Additional incidental findings, as above.  Aortic Atherosclerosis (ICD10-I70.0).  Vinnie Langton M.D. 11/24/2017 15:25  June 19, 2017 DIGITAL SCREENING BILATERAL MAMMOGRAM WITH CAD COMPARISON: Previous exam(s).  ACR Breast Density Category c: The breast tissue is heterogeneously dense, which may obscure small masses.  FINDINGS: There are no findings suspicious for malignancy. Images were processed with CAD.  IMPRESSION: No mammographic evidence of malignancy. A result letter of this screening mammogram will be mailed directly to the patient.  RECOMMENDATION: Screening mammogram in one year. (Code:[id number]-B-01Y)  BI-RADS CATEGORY 1: Negative.  Nolon Nations M.D. 06/19/2017 09:05  Summary/Assessment: In the setting of long-standing anemia over a lengthy, but  indeterminate period of time secondary to iron deficiency, associated with persistent thrombocytosis and leukocytosis of unclear etiology, she presents now for the results of her preliminary laboratory evaluation and recommendations.  Carnelia has been aware of her anemia for many years.  She has been sporadically taking iron supplements  for at least the past year consisting at present, of ferrous sulfate: 325 mg once daily.  It gives her intractable constipation.  While presenting to the emergency department at [facility] on May 12, 2018 a complete blood count showed hemoglobin 7.9 hematocrit 26.8 MCV 71.8 MCH 21.2 RDW 18.1; platelets 534,000.  On an outpatient basis on June 01, 2018 complete blood count showed hemoglobin 8.9 hematocrit 29.7 MCV 69 MCH 20.7 RDW 19.9 WBC 16.4; platelets 568,000.  A look back much further into her medical record from August 26, 2014 a complete blood count showed hemoglobin 13.0 hematocrit 38.8 MCV 78 MCH 26 RDW 16.5 WBC 14.7; platelets 444,000.    On October 11, 2017 a complete blood count showed hemoglobin 10.3 hematocrit 32.0 MCV 75 RDW 16.9 WBC 12.1 with 66% neutrophils 23% lymphocytes 9% monocytes 2% eosinophils 1% basophil; platelets 453,000.  Prior to today's visit her most recent complete blood count shows hemoglobin 8.9 hematocrit 29.7 MCV 69 MCH 20.7 RDW 19.9 WBC 16.4; platelets 568,000.      She was hospitalized in 2015 for bronchitis.  In 2016 she was hospitalized for cholelithiasis.  She has chronic degenerative joint disease involving the neck, lower back, fingers, and knees.  She has compression fractures involving both the thoracic and lumbar spine. The results of her CT imaging from November 24, 2017 suggests extensive coronary artery disease/calcification.       At the time of her initial visit on September 25, a battery of laboratory studies were obtained to exclude any additional contributing factors to her anemia such as an underlying hemolytic process, paraproteinemia, myeloproliferative disorder, nutritional deficit, metabolic explanation, or autoimmune phenomenon.  Those results are detailed above.  We discussed initially the importance of identifying a cause or causes of her underlying anemia, thrombocytosis, and leukocytosis.  Not infrequently in the presence of iron deficiency  anemia, there is a reactive thrombocytosis. If the anemia and the iron deficiency is corrected, generally the thrombosis self-corrects.   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   Recommendation/Plan: The results of her laboratory studies were reviewed and discussed in detail.  Those results are outlined above.  Her iron studies confirm the presence of iron deficiency anemia.  Very importantly, there is no evidence of a myeloproliferative process such as polycythemia vera rubra or chronic myelogenous leukemia.  There was no evidence of hemolysis either in the laboratory studies or on peripheral blood smear.  There is no paraprotein.  She has a mildly elevated immunoglobulin IgA is likely reactive. Her urinalysis from September 30 prompted a urine culture.  The results of that culture revealed 70,000 colonies per high-power field of group B Streptococcus.  Given her underlying comorbidities, she was started on cephalexin: 500 mg twice daily for 5 days.Both vitamin K44 and folic  acid were within normal limits but on the low side of normal.  Because of this, methylmalonic acid was requested. The ANA was negative.  There was no evidence of thalassemia minor or intermedia.    Given her degree of anemia, her reticulocyte count at 2% is low, and suggests an inappropriate bone marrow response to her degree  of anemia.  This can be the hallmark of an underlying chronic inflammatory/infectious/neoplastic process which is suppressing her bone marrow.  Ironically, her serum erythropoietin is high at 59.0.  Her electrolytes were within normal range.  There is no hypercalcemia.  The random glucose was mildly elevated at 121.  Her serum creatinine was 1.02.  The calculated GFR was normal.  Of concern was the elevated CRP at 4.4.  Although very sensitive, the CRP is nonspecific.  It does justify, however, a more extensive search or explanation of an underlying process contributing to her feeling so poorly.  We discussed previously that, given her complex history, having never had a colonoscopy in the past, and being iron deficient, endoscopic evaluation is warranted.  Although she menstruates on a regular basis, and has had no changes in her flow either duration or volume, she may be losing blood from a gastrointestinal source in addition.  She had an esophagogastroduodenoscopy 2 years earlier which failed to reveal any evidence of active bleeding.  She should discuss this with Dr. Margarita Rana with appropriate referral to gastroenterology, as she deems appropriate.  I strongly recommend an endoscopic evaluation.  Because of her symptomatic iron deficiency, and previous lack of response, parenteral iron will be administered beginning next week, following authorization.  She is now scheduled for ferumoxytol, beginning October 21.  She was advised also pending the results of methylmalonic acid, to begin a vitamin B 50 complex in the setting of anticipated brisk erythropoiesis.  Her most recent mammogram was 1 year earlier.  I recommend yearly surveillance.  This can be scheduled for Dr. Smitty Pluck office.   If she has not had a recent bimanual examination and/or Pap smear, it should be done.   He was advised to stay up-to-date on all of her immunizations including the seasonal influenza vaccine.  Barring any unforeseen  complications, her next scheduled doctor visit with laboratory studies is on July 23, 2018.  Questions were answered to her satisfaction.  The total time spent discussing the results of her laboratory studies, role and rationale for parenteral iron replacement, schedule, and potential but uncommon side effects, and mechanics of receiving parenteral iron was 40 minutes. At least 50% of that time was spent in discussion, reviewing outside records, laboratory evaluation, counseling, and answering questions.  This note was dictated using voice activated technology/software.  Unfortunately, typographical errors are not uncommon, and transcription is subject to mistakes and regrettably misinterpretation.  If necessary, clarification of the above information can be discussed with me at any time.  FOLLOW UP: AS DIRECTED   cc:   Raven Rakes, MD    Henreitta Leber, MD  Hematology/Oncology  [place] [address] [place], [place] [address] Office: [phone] TRVU: [phone]

## 2018-06-25 ENCOUNTER — Inpatient Hospital Stay: Payer: Medicaid Other | Attending: Hematology and Oncology | Admitting: Hematology and Oncology

## 2018-06-25 ENCOUNTER — Other Ambulatory Visit: Payer: Self-pay | Admitting: Hematology and Oncology

## 2018-06-25 ENCOUNTER — Encounter: Payer: Self-pay | Admitting: Hematology and Oncology

## 2018-06-25 ENCOUNTER — Inpatient Hospital Stay: Payer: Medicaid Other

## 2018-06-25 ENCOUNTER — Telehealth: Payer: Self-pay | Admitting: Hematology and Oncology

## 2018-06-25 VITALS — BP 131/65 | HR 67 | Temp 98.4°F | Resp 20 | Ht <= 58 in | Wt 214.0 lb

## 2018-06-25 DIAGNOSIS — E039 Hypothyroidism, unspecified: Secondary | ICD-10-CM | POA: Insufficient documentation

## 2018-06-25 DIAGNOSIS — E785 Hyperlipidemia, unspecified: Secondary | ICD-10-CM | POA: Diagnosis not present

## 2018-06-25 DIAGNOSIS — Z794 Long term (current) use of insulin: Secondary | ICD-10-CM

## 2018-06-25 DIAGNOSIS — I10 Essential (primary) hypertension: Secondary | ICD-10-CM

## 2018-06-25 DIAGNOSIS — I700 Atherosclerosis of aorta: Secondary | ICD-10-CM | POA: Diagnosis not present

## 2018-06-25 DIAGNOSIS — I2510 Atherosclerotic heart disease of native coronary artery without angina pectoris: Secondary | ICD-10-CM | POA: Diagnosis not present

## 2018-06-25 DIAGNOSIS — R635 Abnormal weight gain: Secondary | ICD-10-CM | POA: Diagnosis not present

## 2018-06-25 DIAGNOSIS — N3000 Acute cystitis without hematuria: Secondary | ICD-10-CM

## 2018-06-25 DIAGNOSIS — D509 Iron deficiency anemia, unspecified: Secondary | ICD-10-CM | POA: Diagnosis not present

## 2018-06-25 DIAGNOSIS — D539 Nutritional anemia, unspecified: Secondary | ICD-10-CM

## 2018-06-25 DIAGNOSIS — D500 Iron deficiency anemia secondary to blood loss (chronic): Secondary | ICD-10-CM

## 2018-06-25 DIAGNOSIS — Z79899 Other long term (current) drug therapy: Secondary | ICD-10-CM | POA: Diagnosis not present

## 2018-06-25 DIAGNOSIS — K760 Fatty (change of) liver, not elsewhere classified: Secondary | ICD-10-CM | POA: Diagnosis not present

## 2018-06-25 DIAGNOSIS — Z7982 Long term (current) use of aspirin: Secondary | ICD-10-CM | POA: Diagnosis not present

## 2018-06-25 DIAGNOSIS — N390 Urinary tract infection, site not specified: Secondary | ICD-10-CM | POA: Insufficient documentation

## 2018-06-25 DIAGNOSIS — M1990 Unspecified osteoarthritis, unspecified site: Secondary | ICD-10-CM

## 2018-06-25 DIAGNOSIS — G4730 Sleep apnea, unspecified: Secondary | ICD-10-CM | POA: Diagnosis not present

## 2018-06-25 DIAGNOSIS — E119 Type 2 diabetes mellitus without complications: Secondary | ICD-10-CM | POA: Insufficient documentation

## 2018-06-25 DIAGNOSIS — D6489 Other specified anemias: Secondary | ICD-10-CM

## 2018-06-25 DIAGNOSIS — M7989 Other specified soft tissue disorders: Secondary | ICD-10-CM | POA: Insufficient documentation

## 2018-06-25 LAB — C-REACTIVE PROTEIN: CRP: 4.7 mg/dL — ABNORMAL HIGH (ref <1.0)

## 2018-06-25 NOTE — Progress Notes (Signed)
No RN assessment at this time, MD [facility] aware.  Pt with husband and ambulatory w/steady gait using cane.  Pt requires a Patent attorney for future appts.

## 2018-06-25 NOTE — Patient Instructions (Signed)
We discussed in detail the results of your laboratory studies from October 14.  A urinary tract infection was treated for 5 days with cephalexin.  This may be contributing to your elevated white blood cell count.  There is also a nonspecific marker in the blood (CRP) which suggests a high-grade infection or inflammation.  We do not know the source of this problem.  For that reason, and the fact that you are not having heavy periods any longer, I recommend that you discuss a endoscopic evaluation (colonoscopy/EGD) with your primary care physician, Dr. Margarita Rana.  We are concerned that the blood loss is from the gastrointestinal tract and not from your menstruation.  Your iron level was low which may account for your anemia and perhaps elevated platelet count.  It was recommended that you start an iron infusion beginning October 21.  Because you have been taking oral iron (which tends to constipate you) for the past 10 years without benefit, it is not necessary to continue taking it.  It is recommended that you obtain vitamin B 50 complex which is a multivitamin available in all health food stores.  Barring any unforeseen complications, your next scheduled doctor visit with laboratory studies before is on November 11.  Please do not hesitate to call should any questions or problems arise in the interim.  Thank you! Ladona Ridgel, MD Hematology/oncology

## 2018-06-25 NOTE — Telephone Encounter (Signed)
Scheduled appt per 10/14 los - per patient request scheduled iron on 10/22 and f/u on 11/11 at 10 am .  Gave patient AVS and calender per los.

## 2018-06-26 LAB — IRON AND TIBC
Iron: 22 ug/dL — ABNORMAL LOW (ref 28–170)
Saturation Ratios: 5 % — ABNORMAL LOW (ref 10.4–31.8)
TIBC: 427 ug/dL (ref 250–450)
UIBC: 405 ug/dL

## 2018-06-26 LAB — RHEUMATOID FACTOR: Rhuematoid fact SerPl-aCnc: <10 IU/mL (ref 0.0–13.9)

## 2018-06-26 LAB — HOMOCYSTEINE: Homocysteine: 12.3 umol/L (ref 0.0–15.0)

## 2018-06-27 ENCOUNTER — Ambulatory Visit: Payer: Medicaid Other | Admitting: Podiatry

## 2018-06-27 DIAGNOSIS — L600 Ingrowing nail: Secondary | ICD-10-CM

## 2018-06-27 LAB — METHYLMALONIC ACID, SERUM: Methylmalonic Acid, Quantitative: 211 nmol/L (ref 0–378)

## 2018-06-28 ENCOUNTER — Telehealth: Payer: Self-pay

## 2018-06-28 NOTE — Telephone Encounter (Signed)
Per 10/17 vm. Spoke with patient and she needed to r/s her appointment to Monday.

## 2018-06-29 ENCOUNTER — Other Ambulatory Visit: Payer: Self-pay | Admitting: Pulmonary Disease

## 2018-07-02 ENCOUNTER — Inpatient Hospital Stay: Payer: Medicaid Other

## 2018-07-02 ENCOUNTER — Other Ambulatory Visit: Payer: Self-pay | Admitting: Hematology and Oncology

## 2018-07-02 VITALS — BP 127/75 | HR 76 | Temp 98.9°F | Resp 18

## 2018-07-02 DIAGNOSIS — D509 Iron deficiency anemia, unspecified: Secondary | ICD-10-CM

## 2018-07-02 MED ORDER — SODIUM CHLORIDE 0.9 % IV SOLN
Freq: Once | INTRAVENOUS | Status: AC
  Administered 2018-07-02: 09:00:00 via INTRAVENOUS
  Filled 2018-07-02: qty 250

## 2018-07-02 MED ORDER — SODIUM CHLORIDE 0.9 % IV SOLN
510.0000 mg | Freq: Once | INTRAVENOUS | Status: AC
  Administered 2018-07-02: 510 mg via INTRAVENOUS
  Filled 2018-07-02: qty 17

## 2018-07-02 NOTE — Patient Instructions (Signed)
Ferric carboxymaltose injection What is this medicine? FERRIC CARBOXYMALTOSE (ferr-ik car-box-ee-mol-toes) is an iron complex. Iron is used to make healthy red blood cells, which carry oxygen and nutrients throughout the body. This medicine is used to treat anemia in people with chronic kidney disease or people who cannot take iron by mouth. This medicine may be used for other purposes; ask your health care provider or pharmacist if you have questions. COMMON BRAND NAME(S): Injectafer What should I tell my health care provider before I take this medicine? They need to know if you have any of these conditions: -anemia not caused by low iron levels -high levels of iron in the blood -liver disease -an unusual or allergic reaction to iron, other medicines, foods, dyes, or preservatives -pregnant or trying to get pregnant -breast-feeding How should I use this medicine? This medicine is for infusion into a vein. It is given by a health care professional in a hospital or clinic setting. Talk to your pediatrician regarding the use of this medicine in children. Special care may be needed. Overdosage: If you think you have taken too much of this medicine contact a poison control center or emergency room at once. NOTE: This medicine is only for you. Do not share this medicine with others. What if I miss a dose? It is important not to miss your dose. Call your doctor or health care professional if you are unable to keep an appointment. What may interact with this medicine? Do not take this medicine with any of the following medications: -deferoxamine -dimercaprol -other iron products This medicine may also interact with the following medications: -chloramphenicol -deferasirox This list may not describe all possible interactions. Give your health care provider a list of all the medicines, herbs, non-prescription drugs, or dietary supplements you use. Also tell them if you smoke, drink alcohol, or use  illegal drugs. Some items may interact with your medicine. What should I watch for while using this medicine? Visit your doctor or health care professional regularly. Tell your doctor if your symptoms do not start to get better or if they get worse. You may need blood work done while you are taking this medicine. You may need to follow a special diet. Talk to your doctor. Foods that contain iron include: whole grains/cereals, dried fruits, beans, or peas, leafy green vegetables, and organ meats (liver, kidney). What side effects may I notice from receiving this medicine? Side effects that you should report to your doctor or health care professional as soon as possible: -allergic reactions like skin rash, itching or hives, swelling of the face, lips, or tongue -breathing problems -changes in blood pressure -feeling faint or lightheaded, falls -flushing, sweating, or hot feelings Side effects that usually do not require medical attention (report to your doctor or health care professional if they continue or are bothersome): -changes in taste -constipation -dizziness -headache -nausea -pain, redness, or irritation at site where injected -vomiting This list may not describe all possible side effects. Call your doctor for medical advice about side effects. You may report side effects to FDA at 1-800-FDA-1088. Where should I keep my medicine? This drug is given in a hospital or clinic and will not be stored at home. NOTE: This sheet is a summary. It may not cover all possible information. If you have questions about this medicine, talk to your doctor, pharmacist, or health care provider.  2018 Elsevier/Gold Standard (2015-10-01 11:20:47)  

## 2018-07-03 ENCOUNTER — Inpatient Hospital Stay: Payer: Medicaid Other

## 2018-07-05 NOTE — Progress Notes (Signed)
   Subjective: Patient presents today 2 weeks post ingrown nail permanent nail avulsion procedure of the lateral border of the left hallux. Patient states that the toe and nail fold is feeling much better. She reports only minimal continued soreness. Patient is here for further evaluation and treatment.   Past Medical History:  Diagnosis Date  . Anemia   . Asthma   . Bilateral hand numbness 06/25/2015  . Bone pain 02/27/2015   Diffuse bone pain in legs mostly but also in arms    . Breast pain, left 03/27/2015  . Callus of heel 12/16/2014  . Compression fracture   . Compression fracture of L1 lumbar vertebra (HCC) 08/26/2014  . Compression fracture of T12 vertebra ([place]) 08/28/2014  . COPD (chronic obstructive pulmonary disease) ([place]) 2013  . Cushing syndrome ([place]) 2013   . Depression   . Diabetes ([place]) 2013   . Diabetes mellitus type 2, uncontrolled ([place]) 08/26/2014   Sees Dr. Katy Fitch.  Last visit 11/27/14.  No diabetic retinopathy    . Diabetes mellitus with neuropathy ([place])   . Diastolic dysfunction without heart failure 10/14/2014   Grade 2 diastolic dysfunction 2/2 pulm HTN   . Generalized anxiety disorder 03/27/2015  . HTN (hypertension) 2005   . Hx of cataract surgery 11/03/2014  . LUQ abdominal pain 07/17/2015  . Medication management 05/12/2015  . Morbid obesity ([place]) 09/08/2014  . 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 Non-English speaking patient 09/08/2014  . OSA (obstructive sleep apnea) 05/20/2015  . Osteopenia 11/19/2013   Dx with osteopenia in lumbar vertebra with partial compression of L1  . Pain due to onychomycosis of toenail 03/27/2015  . Secondary Cushing's syndrome/ iatrogenic   . Skin rash 07/17/2015  . Tachycardia 01/13/2015  . Upper airway cough syndrome 09/05/2014   Followed in Pulmonary clinic/ [facility] [place]  - Trial off advair      09/05/2014 >>>  - trial off theoph 09/17/2014 and added flutter  - rec reduce dulera to 100 2 bid 07/13/2015     . Vision changes  06/25/2015  . Vitamin D deficiency 08/28/2014    Objective: Skin is warm, dry and supple. Nail and respective nail fold appears to be healing appropriately. Open wound to the associated nail fold with a granular wound base and moderate amount of fibrotic tissue. Minimal drainage noted. Mild erythema around the periungual region likely due to phenol chemical matricectomy.  Assessment: #1 postop permanent partial nail avulsion lateral border left hallux  #2 open wound periungual nail fold of respective digit.   Plan of care: #1 patient was evaluated  #2 debridement of open wound was performed to the periungual border of the respective toe using a currette. Antibiotic ointment and Band-Aid was applied. #3 patient is to return to clinic on a PRN basis.   Edrick Kins, DPM [facility]  Dr. Edrick Kins, [place]                                        [place], [place] [address]                Office [phone]  Fax [phone]

## 2018-07-13 ENCOUNTER — Telehealth: Payer: Self-pay | Admitting: Pulmonary Disease

## 2018-07-13 ENCOUNTER — Ambulatory Visit (INDEPENDENT_AMBULATORY_CARE_PROVIDER_SITE_OTHER): Payer: Medicaid Other | Admitting: Pulmonary Disease

## 2018-07-13 ENCOUNTER — Encounter: Payer: Self-pay | Admitting: Pulmonary Disease

## 2018-07-13 VITALS — BP 138/70 | HR 90 | Ht <= 58 in | Wt 213.0 lb

## 2018-07-13 DIAGNOSIS — Z23 Encounter for immunization: Secondary | ICD-10-CM | POA: Diagnosis not present

## 2018-07-13 DIAGNOSIS — K219 Gastro-esophageal reflux disease without esophagitis: Secondary | ICD-10-CM | POA: Diagnosis not present

## 2018-07-13 DIAGNOSIS — R05 Cough: Secondary | ICD-10-CM

## 2018-07-13 DIAGNOSIS — R058 Other specified cough: Secondary | ICD-10-CM

## 2018-07-13 DIAGNOSIS — J4550 Severe persistent asthma, uncomplicated: Secondary | ICD-10-CM

## 2018-07-13 MED ORDER — PREDNISONE 1 MG PO TABS: 3.0000 mg | ORAL_TABLET | Freq: Every day | ORAL | 2 refills | Status: DC

## 2018-07-13 NOTE — Progress Notes (Signed)
Subjective:    Patient ID: Raven Turner, female    DOB: 10-Jun-1971, 47 y.o.   MRN: [id number]  Synopsis: Former patient of Dr. Melvyn Novas with recurrent wheeze, shortness of breath and cough felt to be due to either vocal cord dysfunction or asthma.  [facility] in 2017, no paradoxical movements of the vocal cords noted   HPI Chief Complaint  Patient presents with  . Follow-up    reports feeling same   She says that she has been doing well since the last visit.  She does have some exertional dyspnea but she has not been sick with bronchitis or pneumonia since the last visit.  She is a bit frustrated however because she says that there was a problem with drug delivery for the [place] and she went about a month without it.  She ended up picking up 2 doses from a pharmacy yesterday.  She has been taking prednisone 4 mg daily.  She continues to take her other medicines.  Past Medical History:  Diagnosis Date  . Anemia   . Asthma   . Bilateral hand numbness 06/25/2015  . Bone pain 02/27/2015   Diffuse bone pain in legs mostly but also in arms    . Breast pain, left 03/27/2015  . Callus of heel 12/16/2014  . Compression fracture   . Compression fracture of L1 lumbar vertebra (HCC) 08/26/2014  . Compression fracture of T12 vertebra ([place]) 08/28/2014  . COPD (chronic obstructive pulmonary disease) ([place]) 2013  . Cushing syndrome ([place]) 2013   . Depression   . Diabetes ([place]) 2013   . Diabetes mellitus type 2, uncontrolled ([place]) 08/26/2014   Sees Dr. Katy Fitch.  Last visit 11/27/14.  No diabetic retinopathy    . Diabetes mellitus with neuropathy ([place])   . Diastolic dysfunction without heart failure 10/14/2014   Grade 2 diastolic dysfunction 2/2 pulm HTN   . Generalized anxiety disorder 03/27/2015  . HTN (hypertension) 2005   . Hx of cataract surgery 11/03/2014  . LUQ abdominal pain 07/17/2015  . Medication management 05/12/2015  . Morbid obesity ([place]) 09/08/2014   . 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 Non-English speaking patient 09/08/2014  . OSA (obstructive sleep apnea) 05/20/2015  . Osteopenia 11/19/2013   Dx with osteopenia in lumbar vertebra with partial compression of L1  . Pain due to onychomycosis of toenail 03/27/2015  . Secondary Cushing's syndrome/ iatrogenic   . Skin rash 07/17/2015  . Tachycardia 01/13/2015  . Upper airway cough syndrome 09/05/2014   Followed in Pulmonary clinic/ [facility]/ [place]  - Trial off advair      09/05/2014 >>>  - trial off theoph 09/17/2014 and added flutter  - rec reduce dulera to 100 2 bid 07/13/2015     . Vision changes 06/25/2015  . Vitamin D deficiency 08/28/2014        Review of Systems  Constitutional: Negative for chills, fatigue and fever.  HENT: Negative for postnasal drip, rhinorrhea and sinus pressure.   Respiratory: Positive for shortness of breath. Negative for cough and wheezing.   Cardiovascular: Negative for chest pain, palpitations and leg swelling.  Hematological: Bruises/bleeds easily: .dbmfu.       Objective:   Physical Exam Vitals:   07/13/18 0955  BP: 138/70  Pulse: 90  SpO2: 97%  Weight: 213 lb (96.6 kg)  Height: 4' 6.72" (1.39 m)  RA  Gen: obese but well appearing HENT: OP clear, TM's clear, neck supple PULM: CTA B, normal  percussion CV: RRR, no mgr, trace edema GI: BS+, soft, nontender Derm: no cyanosis or rash Psyche: normal mood and affect   PFT 11/03/14  PFTs p am dulera 100 on 30 mg prednisone  FEV1 1.56(73%) ratio 85 =  No obst, including fef 25-75%, only abn is reduction in ERV spirometry 03/31/15 1.48 (62%) ratio 82  p am dulera and while symptomatic requesting saba  Labs July 2017 IgE 55 (normal), CBC without eosinophils February 2019 CBC with differential showed 400 eosinophils per deciliter  Chest imaging: March 2019 high-resolution CT scan of the chest images independently reviewed showing some mild air trapping but no interstitial lung  disease.      Assessment & Plan:  Severe persistent asthma without complication  Upper airway cough syndrome  Morbid obesity (HCC)  Gastroesophageal reflux disease, esophagitis presence not specified  Discussion: This has been a stable interval for her.  She is concerned about the lack of coverage or at least a lost dose of Dupixent because the medicine was not mailed to her.  I would like for her to talk to someone in our office today before she leaves to make sure that she has a good plan moving forward to make sure she is still receiving this medicine in the mail.  Plan: Severe persistent asthma with recurrent exacerbations: Continue to pick since We will decrease prednisone from 4 mg daily to 3 mg daily today, on the next visit if you are stable we will decrease further to 2 mg daily Flu shot today Continue Dulera and Singulair Practice good hand hygiene Stay active  We will see you back in late December or early January  Greater than 15 minutes spent with patient, 30-minute visit     Current Outpatient Medications:  .  albuterol (PROVENTIL HFA;VENTOLIN HFA) 108 (90 Base) MCG/ACT inhaler, Inhale 2 puffs into the lungs every 4 (four) hours as needed for wheezing or shortness of breath (((PLAN B))). (Patient taking differently: Inhale 2 puffs into the lungs every 4 (four) hours as needed for wheezing or shortness of breath. ), Disp: 18 g, Rfl: 6 .  albuterol (PROVENTIL) (2.5 MG/3ML) 0.083% nebulizer solution, USE 1 VIAL VIA NEBULIZER EVERY 4 HOURS AS NEEDED FOR WHEEZING OR SHORTNESS OF BREATH (Patient taking differently: Take 2.5 mg by nebulization every 4 (four) hours as needed for wheezing or shortness of breath. ), Disp: 90 mL, Rfl: 0 .  aspirin EC 81 MG tablet, Take 1 tablet (81 mg total) by mouth daily., Disp: 90 tablet, Rfl: 3 .  Blood Glucose Monitoring Suppl (ACCU-CHEK AVIVA PLUS) W/DEVICE KIT, Used as directed, Disp: 1 kit, Rfl: 0 .  buprenorphine (BUTRANS - DOSED  MCG/HR) 5 MCG/HR PTWK patch, Place 5 mcg onto the skin once a week., Disp: , Rfl:  .  cabergoline (DOSTINEX) 0.5 MG tablet, Take 0.5 tablets (0.25 mg total) by mouth 2 (two) times a week., Disp: 10 tablet, Rfl: 3 .  cholecalciferol (VITAMIN D) 1000 UNITS tablet, Take 1,000 Units by mouth every morning., Disp: , Rfl:  .  cloNIDine (CATAPRES) 0.2 MG tablet, TAKE 1 TABLET(0.2 MG) BY MOUTH THREE TIMES DAILY, Disp: 90 tablet, Rfl: 2 .  DULERA 200-5 MCG/ACT AERO, INHALE 2 PUFFS INTO THE LUNGS TWICE DAILY, Disp: 13 g, Rfl: 3 .  DUPIXENT 300 MG/2ML SOSY, Inject 300 mg into the skin See admin instructions. Inject under skin every 15 days, Disp: , Rfl: 6 .  ferrous sulfate 325 (65 FE) MG tablet, Take 1 tablet (325 mg  total) by mouth daily., Disp: 30 tablet, Rfl: 0 .  furosemide (LASIX) 20 MG tablet, Take 1 tablet (20 mg total) by mouth every morning., Disp: 30 tablet, Rfl: 2 .  gabapentin (NEURONTIN) 400 MG capsule, Take 2 capsules (800 mg total) by mouth 3 (three) times daily., Disp: 180 capsule, Rfl: 3 .  gentamicin cream (GARAMYCIN) 0.1 %, Apply 1 application topically 2 (two) times daily., Disp: 30 g, Rfl: 1 .  glucose blood (RELION GLUCOSE TEST STRIPS) test strip, 1 each by Other route 4 (four) times daily. ICD code E11.65, Disp: 400 each, Rfl: 3 .  HUMULIN R U-500 KWIKPEN 500 UNIT/ML kwikpen, Inject 120-180 Units into the skin 3 (three) times daily. 180 U before breakfast, 160 U before lunch, 140 U before dinner, Disp: 27 mL, Rfl: 11 .  hydrOXYzine (ATARAX/VISTARIL) 25 MG tablet, TAKE 1 TABLET BY MOUTH AT BEDTIME AS NEEDED FOR ANXIETY. (Patient taking differently: Take 25 mg by mouth at bedtime as needed for anxiety. ), Disp: 30 tablet, Rfl: 6 .  Insulin Pen Needle (B-D ULTRAFINE III SHORT PEN) 31G X 8 MM MISC, 1 application by Does not apply route daily., Disp: 100 each, Rfl: 3 .  Insulin Syringe-Needle U-100 (BD INSULIN SYRINGE ULTRAFINE) 31G X 15/64" 1 ML MISC, 1 each by Does not apply route 3 (three)  times daily., Disp: 300 each, Rfl: 3 .  Lancet Devices (ACCU-CHEK SOFTCLIX) lancets, Use as instructed, Disp: 1 each, Rfl: 0 .  levothyroxine (SYNTHROID, LEVOTHROID) 25 MCG tablet, Take 25 mcg by mouth daily., Disp: , Rfl: 3 .  losartan (COZAAR) 100 MG tablet, Take 1 tablet (100 mg total) by mouth every morning., Disp: 30 tablet, Rfl: 6 .  metFORMIN (GLUCOPHAGE) 1000 MG tablet, TAKE 1 TABLET BY MOUTH TWICE DAILY WITH A MEAL (Patient taking differently: Take 1,000 mg by mouth 2 (two) times daily with a meal. ), Disp: 60 tablet, Rfl: 2 .  methocarbamol (ROBAXIN) 750 MG tablet, Take 1 tablet (750 mg total) by mouth 3 (three) times daily., Disp: 60 tablet, Rfl: 0 .  Misc. Devices (CANE) MISC, 1 each by Does not apply route daily., Disp: 1 each, Rfl: 0 .  montelukast (SINGULAIR) 10 MG tablet, Take 1 tablet (10 mg total) by mouth at bedtime., Disp: 30 tablet, Rfl: 6 .  omeprazole (PRILOSEC) 20 MG capsule, TAKE ONE CAPSULE BY MOUTH EVERY DAY BEFORE BREAKFAST (Patient taking differently: Take 20 mg by mouth daily. BEFORE BREAKFAST), Disp: 30 capsule, Rfl: 6 .  Oyster Shell Calcium 500 MG TABS, Take 1 tablet (1,250 mg total) by mouth daily., Disp: 90 tablet, Rfl: 3 .  PARoxetine (PAXIL) 20 MG tablet, Take 1 tablet (20 mg total) by mouth daily., Disp: 30 tablet, Rfl: 6 .  predniSONE (DELTASONE) 1 MG tablet, Take 3 tablets (3 mg total) by mouth daily., Disp: 90 tablet, Rfl: 2 .  ranitidine (ZANTAC) 300 MG tablet, Take 1 tablet (300 mg total) by mouth at bedtime., Disp: 30 tablet, Rfl: 5 .  ReliOn Ultra Thin Lancets MISC, 1 each by Does not apply route 4 (four) times daily. ICD code E11.65, Disp: 400 each, Rfl: 3 .  Respiratory Therapy Supplies (FLUTTER) DEVI, Use as directed, Disp: 1 each, Rfl: 0 .  Spacer/Aero-Holding Chambers (AEROCHAMBER MV) inhaler, Use as instructed, Disp: 1 each, Rfl: 0 .  spironolactone (ALDACTONE) 25 MG tablet, Take 0.5 tablets (12.5 mg total) by mouth daily., Disp: 45 tablet, Rfl: 3 .   ULTICARE MICRO PEN NEEDLES 32G X 4 MM  MISC, USE AS DIRECTED, Disp: 100 each, Rfl: 2 .  verapamil (CALAN-SR) 180 MG CR tablet, Take 2 tablets (360 mg total) by mouth at bedtime., Disp: 180 tablet, Rfl: 2 .  atorvastatin (LIPITOR) 40 MG tablet, Take 1 tablet (40 mg total) by mouth daily., Disp: 90 tablet, Rfl: 3  Current Facility-Administered Medications:  .  betamethasone acetate-betamethasone sodium phosphate (CELESTONE) injection 3 mg, 3 mg, Intramuscular, Once, Evans, Brent M, DPM .  umeclidinium bromide (INCRUSE ELLIPTA) 62.5 MCG/INH 1 puff, 1 puff, Inhalation, Daily, Ladell Pier, MD  Facility-Administered Medications Ordered in Other Visits:  .  DOBUTamine (DOBUTREX) 1,000 mcg/mL in dextrose 5% 250 mL infusion, 30 mcg/kg/min, Intravenous, Continuous, Croitoru, Mihai, MD, Last Rate: 145.3 mL/hr at 04/22/15 1300, 30 mcg/kg/min at 04/22/15 1300

## 2018-07-13 NOTE — Patient Instructions (Signed)
Severe persistent asthma with recurrent exacerbations: Continue to pick since We will decrease prednisone from 4 mg daily to 3 mg daily today, on the next visit if you are stable we will decrease further to 2 mg daily Flu shot today Continue Dulera and Singulair Practice good hand hygiene Stay active  We will see you back in late December or early January

## 2018-07-13 NOTE — Telephone Encounter (Signed)
Pt. Saw McQuaid today, he sent her to me to call the pharm.[facility] [place] said they call her but when they do it always says this cruiut is busy. Loletha Grayer (rep) asked if she could give them another #. She gave them her husband's # the last time she called. They still had her home # as the primary #, he changed it so there should be no more problem. Nothing further needed.

## 2018-07-16 ENCOUNTER — Telehealth: Payer: Self-pay | Admitting: Family Medicine

## 2018-07-16 DIAGNOSIS — D6489 Other specified anemias: Secondary | ICD-10-CM

## 2018-07-16 NOTE — Telephone Encounter (Signed)
Will route to PCP for review. 

## 2018-07-16 NOTE — Telephone Encounter (Signed)
Patient called requesting to be referred out to the Gastroenterology because her Oncologist wants to prevent a bleed. Patient called her old [facility] and was told that she needed a referral from her PCP. Patient would like to see Dr. Anson Fret. Please f/u

## 2018-07-17 NOTE — Telephone Encounter (Signed)
Referral placed.

## 2018-07-22 ENCOUNTER — Other Ambulatory Visit: Payer: Self-pay | Admitting: Hematology and Oncology

## 2018-07-22 DIAGNOSIS — D509 Iron deficiency anemia, unspecified: Secondary | ICD-10-CM

## 2018-07-23 ENCOUNTER — Inpatient Hospital Stay: Payer: Medicaid Other | Attending: Hematology and Oncology | Admitting: Hematology and Oncology

## 2018-07-23 ENCOUNTER — Encounter: Payer: Self-pay | Admitting: Hematology and Oncology

## 2018-07-23 ENCOUNTER — Telehealth: Payer: Self-pay | Admitting: Hematology and Oncology

## 2018-07-23 ENCOUNTER — Inpatient Hospital Stay: Payer: Medicaid Other

## 2018-07-23 VITALS — BP 132/77 | HR 68 | Temp 98.2°F | Resp 18 | Ht <= 58 in | Wt 213.7 lb

## 2018-07-23 DIAGNOSIS — I10 Essential (primary) hypertension: Secondary | ICD-10-CM | POA: Insufficient documentation

## 2018-07-23 DIAGNOSIS — E119 Type 2 diabetes mellitus without complications: Secondary | ICD-10-CM | POA: Diagnosis not present

## 2018-07-23 DIAGNOSIS — R1013 Epigastric pain: Secondary | ICD-10-CM | POA: Insufficient documentation

## 2018-07-23 DIAGNOSIS — G629 Polyneuropathy, unspecified: Secondary | ICD-10-CM

## 2018-07-23 DIAGNOSIS — D509 Iron deficiency anemia, unspecified: Secondary | ICD-10-CM | POA: Diagnosis not present

## 2018-07-23 DIAGNOSIS — E039 Hypothyroidism, unspecified: Secondary | ICD-10-CM | POA: Diagnosis not present

## 2018-07-23 DIAGNOSIS — R609 Edema, unspecified: Secondary | ICD-10-CM

## 2018-07-23 DIAGNOSIS — M1990 Unspecified osteoarthritis, unspecified site: Secondary | ICD-10-CM

## 2018-07-23 DIAGNOSIS — F418 Other specified anxiety disorders: Secondary | ICD-10-CM | POA: Insufficient documentation

## 2018-07-23 DIAGNOSIS — Z79899 Other long term (current) drug therapy: Secondary | ICD-10-CM | POA: Diagnosis not present

## 2018-07-23 DIAGNOSIS — K219 Gastro-esophageal reflux disease without esophagitis: Secondary | ICD-10-CM

## 2018-07-23 DIAGNOSIS — R202 Paresthesia of skin: Secondary | ICD-10-CM | POA: Insufficient documentation

## 2018-07-23 DIAGNOSIS — Z7982 Long term (current) use of aspirin: Secondary | ICD-10-CM | POA: Diagnosis not present

## 2018-07-23 DIAGNOSIS — M7989 Other specified soft tissue disorders: Secondary | ICD-10-CM | POA: Diagnosis not present

## 2018-07-23 DIAGNOSIS — J45909 Unspecified asthma, uncomplicated: Secondary | ICD-10-CM

## 2018-07-23 DIAGNOSIS — I2510 Atherosclerotic heart disease of native coronary artery without angina pectoris: Secondary | ICD-10-CM | POA: Insufficient documentation

## 2018-07-23 DIAGNOSIS — I700 Atherosclerosis of aorta: Secondary | ICD-10-CM | POA: Insufficient documentation

## 2018-07-23 DIAGNOSIS — G4730 Sleep apnea, unspecified: Secondary | ICD-10-CM | POA: Diagnosis not present

## 2018-07-23 DIAGNOSIS — E785 Hyperlipidemia, unspecified: Secondary | ICD-10-CM | POA: Insufficient documentation

## 2018-07-23 DIAGNOSIS — K760 Fatty (change of) liver, not elsewhere classified: Secondary | ICD-10-CM | POA: Diagnosis not present

## 2018-07-23 DIAGNOSIS — M8580 Other specified disorders of bone density and structure, unspecified site: Secondary | ICD-10-CM | POA: Insufficient documentation

## 2018-07-23 DIAGNOSIS — R0609 Other forms of dyspnea: Secondary | ICD-10-CM | POA: Insufficient documentation

## 2018-07-23 DIAGNOSIS — Z794 Long term (current) use of insulin: Secondary | ICD-10-CM

## 2018-07-23 LAB — CBC WITH DIFFERENTIAL (CANCER CENTER ONLY)
Abs Immature Granulocytes: 0.07 10*3/uL (ref 0.00–0.07)
Basophils Absolute: 0.1 10*3/uL (ref 0.0–0.1)
Basophils Relative: 1 %
Eosinophils Absolute: 0.4 10*3/uL (ref 0.0–0.5)
Eosinophils Relative: 4 %
HCT: 32.4 % — ABNORMAL LOW (ref 36.0–46.0)
Hemoglobin: 9.7 g/dL — ABNORMAL LOW (ref 12.0–15.0)
Immature Granulocytes: 1 %
Lymphocytes Relative: 36 %
Lymphs Abs: 3.8 10*3/uL (ref 0.7–4.0)
MCH: 22.8 pg — ABNORMAL LOW (ref 26.0–34.0)
MCHC: 29.9 g/dL — ABNORMAL LOW (ref 30.0–36.0)
MCV: 76.2 fL — ABNORMAL LOW (ref 80.0–100.0)
Monocytes Absolute: 0.7 10*3/uL (ref 0.1–1.0)
Monocytes Relative: 6 %
Neutro Abs: 5.6 10*3/uL (ref 1.7–7.7)
Neutrophils Relative %: 52 %
Platelet Count: 425 10*3/uL — ABNORMAL HIGH (ref 150–400)
RBC: 4.25 MIL/uL (ref 3.87–5.11)
RDW: 27.7 % — ABNORMAL HIGH (ref 11.5–15.5)
WBC Count: 10.6 10*3/uL — ABNORMAL HIGH (ref 4.0–10.5)
nRBC: 0 % (ref 0.0–0.2)

## 2018-07-23 LAB — BASIC METABOLIC PANEL - CANCER CENTER ONLY
Anion gap: 10 (ref 5–15)
BUN: 13 mg/dL (ref 6–20)
CO2: 27 mmol/L (ref 22–32)
Calcium: 9.5 mg/dL (ref 8.9–10.3)
Chloride: 106 mmol/L (ref 98–111)
Creatinine: 1.09 mg/dL — ABNORMAL HIGH (ref 0.44–1.00)
GFR, Est AFR Am: >60 mL/min (ref >60)
GFR, Estimated: 60 mL/min — ABNORMAL LOW (ref >60)
Glucose, Bld: 91 mg/dL (ref 70–99)
Potassium: 3.9 mmol/L (ref 3.5–5.1)
Sodium: 143 mmol/L (ref 135–145)

## 2018-07-23 LAB — FERRITIN: Ferritin: 163 ng/mL (ref 11–307)

## 2018-07-23 NOTE — Progress Notes (Signed)
Hematology/Oncology Outpatient Progress Note  Patient Name:  Raven Turner  DOB: 1970/10/10   Date of Service: July 23, 2018  Referring Provider: Charlott Rakes, MD  [place], [place] [address]    Consulting Physician: Henreitta Leber, MD Hematology/Oncology  Reason for Visit: In the setting of long-standing anemia over a lengthy, but indeterminate period of time secondary to iron deficiency, associated with persistent thrombocytosis and leukocytosis; status post parenteral iron on October 21, she presents now for the results of her lab work from today and recommendations.  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  She understands and speaks a very minimal amount of [place]. There is a Optometrist present at this visit.    Raven Turner has been aware of her anemia for many years.  She has been taking iron supplements for at least the past year consisting at present, of ferrous sulfate: 325 mg once daily.  While presenting to the emergency department at [facility] on May 12, 2018 a complete blood count showed hemoglobin 7.9 hematocrit 26.8 MCV 71.8 MCH 21.2 RDW 18.1; platelets 534,000.  On an outpatient basis on  June 01, 2018 complete blood count showed hemoglobin 8.9 hematocrit 29.7 MCV 69 MCH 20.7 RDW 19.9 WBC 16.4; platelets 568,000.  A look back much further into her medical record from August 26, 2014 a complete blood count showed hemoglobin 13.0 hematocrit 38.8 MCV 78 MCH 26 RDW 16.5 WBC 14.7; platelets 444,000.  On September 08, 2014 an HIV antibody was negative.  On September 15, 2014 a complete blood count showed hemoglobin 13.4 hematocrit 39.4 WBC 18.5 with 90% neutrophils 6% lymphocytes 4% monocytes; platelets 441,000.  On September 13, 2015 a complete blood count showed hemoglobin 13.0 hematocrit 39.8 WBC 11.1; platelets 453,000.  More recently on October 11, 2017 a complete blood count showed hemoglobin 10.3 hematocrit 32.0 MCV 75 RDW 16.9 WBC 12.1 with 66% neutrophils 23% lymphocytes 9% monocytes 2% eosinophils 1% basophil; platelets 453,000.  Prior to today's visit her most recent complete blood count shows hemoglobin 8.9 hematocrit 29.7 MCV 69 MCH 20.7 RDW 19.9 WBC 16.4; platelets 568,000.    She was hospitalized in 2015 for bronchitis.  In 2016 she was hospitalized for cholelithiasis.  She has chronic degenerative joint disease involving the neck, lower back, fingers, and knees.  She has compression fractures involving both the thoracic and lumbar spine. The results of her CT imaging from November 24, 2017 suggests extensive coronary artery disease/calcification.    On September 25, a battery of laboratory studies were obtained to exclude any additional contributing factors to her anemia such as an underlying hemolytic process, paraproteinemia, myeloproliferative disorder, nutritional deficit, metabolic explanation, or autoimmune phenomenon.    At the time of her initial visit, we discussed also the importance of identifying a cause or causes of her underlying anemia, thrombocytosis, and leukocytosis.  Not infrequently iron deficiency anemia is associated with a reactive thrombocytosis.  If the anemia  and the iron deficiency is corrected, generally the thrombocytosis self-corrects.  That is exactly what has happened.  Her first dose of parenteral iron was on July 02, 2018.  Following that treatment, her dizziness and lightheadedness have improved.  Her overall energy level remains low but stable.  She continues on daily prednisone now for several years because of her bronchial asthma.  Urine was obtained for urinalysis and urine culture and sensitivity.  Her urine culture was positive for group B Streptococcus (70,000 per high-power field).  She was started on cephalexin 500 mg twice daily for 5 days.    We discussed the possibility of having a diagnostic colonoscopy, which she has never had.  Given her complex history, a colonoscopy is warranted due to her protracted iron deficiency anemia.  Her menses continue but are relatively moderate in flow.  She had an esophagogastroduodenoscopy 2 years earlier which failed to reveal any evidence of active bleeding.  It is with this background she presents now for repeat laboratory evaluation with recommendations in the setting of long-standing anemia of iron deficiency with likely reactive thrombocytosis as outlined above.  Interval History: In the interim since she reports no new problems or complaints. Her weight has increased 20 pounds over the past 2 months. Her energy level is chronically low.  She has no dizziness, lightheadedness, syncope, near syncopal episodes. She reports no unusual cough, sore throat, or orthopnea.  She has chronic but stable exertional dyspnea unchanged over her usual baseline. She denies pain or difficulty in swallowing. She has no fever, shaking chills, sweats, or flulike symptoms.  She denies heartburn or indigestion. She has no nausea, vomiting, diarrhea, or constipation.  She moves her bowels once daily.  Her dyspepsia is controlled with protein pump inhibitor therapy.  She reports no melena or bright red blood per rectum.  Her  ankles swell slightly during the daytime and received overnight.  She complains of paresthesias involving the hands bilaterally.  There are no new focal areas of bone, joint, muscle pain.   Past Medical History Reviewed        Family History Reviewed       Social History Reviewed  Allergies  Allergen Reactions  . Shellfish Allergy Anaphylaxis  . Septra [Sulfamethoxazole-Trimethoprim] Rash, Other (See Comments) and Itching    Facial redness with pruritus  Facial redness with pruritus   . Hydrocortisone Rash  No seasonal allergies  Current Outpatient Medications on File Prior to Visit  Medication Sig  . albuterol (PROVENTIL HFA;VENTOLIN HFA) 108 (90 Base) MCG/ACT inhaler Inhale 2 puffs into the lungs every 4 (four) hours as needed for wheezing or shortness of breath (((PLAN B))). (Patient taking differently: Inhale 2 puffs into the lungs every 4 (four) hours as needed for wheezing or shortness of breath. )  . albuterol (PROVENTIL) (2.5 MG/3ML) 0.083% nebulizer solution USE 1 VIAL VIA NEBULIZER EVERY 4 HOURS AS NEEDED FOR WHEEZING OR SHORTNESS OF BREATH (Patient taking differently: Take 2.5 mg by nebulization every 4 (four) hours as needed for wheezing or shortness of breath. )  . aspirin EC 81 MG tablet Take 1 tablet (81 mg total) by mouth daily.  Marland Kitchen atorvastatin (LIPITOR) 40 MG tablet Take 1 tablet (40 mg total) by mouth daily.  . Blood Glucose Monitoring Suppl (ACCU-CHEK AVIVA PLUS) W/DEVICE KIT Used as directed  . buprenorphine (BUTRANS - DOSED MCG/HR) 5 MCG/HR PTWK patch Place 5 mcg onto the skin once a week.  . cabergoline (DOSTINEX) 0.5 MG tablet Take 0.5 tablets (  0.25 mg total) by mouth 2 (two) times a week.  . cholecalciferol (VITAMIN D) 1000 UNITS tablet Take 1,000 Units by mouth every morning.  . cloNIDine (CATAPRES) 0.2 MG tablet TAKE 1 TABLET(0.2 MG) BY MOUTH THREE TIMES DAILY  . DULERA 200-5 MCG/ACT AERO INHALE 2 PUFFS INTO THE LUNGS TWICE DAILY  . DUPIXENT 300 MG/2ML SOSY Inject  300 mg into the skin See admin instructions. Inject under skin every 15 days  . ferrous sulfate 325 (65 FE) MG tablet Take 1 tablet (325 mg total) by mouth daily.  . furosemide (LASIX) 20 MG tablet Take 1 tablet (20 mg total) by mouth every morning.  . gabapentin (NEURONTIN) 400 MG capsule Take 2 capsules (800 mg total) by mouth 3 (three) times daily.  Marland Kitchen gentamicin cream (GARAMYCIN) 0.1 % Apply 1 application topically 2 (two) times daily.  Marland Kitchen glucose blood (RELION GLUCOSE TEST STRIPS) test strip 1 each by Other route 4 (four) times daily. ICD code E11.65  . HUMULIN R U-500 KWIKPEN 500 UNIT/ML kwikpen Inject 120-180 Units into the skin 3 (three) times daily. 180 U before breakfast, 160 U before lunch, 140 U before dinner  . hydrOXYzine (ATARAX/VISTARIL) 25 MG tablet TAKE 1 TABLET BY MOUTH AT BEDTIME AS NEEDED FOR ANXIETY. (Patient taking differently: Take 25 mg by mouth at bedtime as needed for anxiety. )  . Insulin Pen Needle (B-D ULTRAFINE III SHORT PEN) 31G X 8 MM MISC 1 application by Does not apply route daily.  . Insulin Syringe-Needle U-100 (BD INSULIN SYRINGE ULTRAFINE) 31G X 15/64" 1 ML MISC 1 each by Does not apply route 3 (three) times daily.  Elmore Guise Devices (ACCU-CHEK SOFTCLIX) lancets Use as instructed  . levothyroxine (SYNTHROID, LEVOTHROID) 25 MCG tablet Take 25 mcg by mouth daily.  Marland Kitchen losartan (COZAAR) 100 MG tablet Take 1 tablet (100 mg total) by mouth every morning.  . metFORMIN (GLUCOPHAGE) 1000 MG tablet TAKE 1 TABLET BY MOUTH TWICE DAILY WITH A MEAL (Patient taking differently: Take 1,000 mg by mouth 2 (two) times daily with a meal. )  . methocarbamol (ROBAXIN) 750 MG tablet Take 1 tablet (750 mg total) by mouth 3 (three) times daily.  . Misc. Devices (CANE) MISC 1 each by Does not apply route daily.  . montelukast (SINGULAIR) 10 MG tablet Take 1 tablet (10 mg total) by mouth at bedtime.  Marland Kitchen omeprazole (PRILOSEC) 20 MG capsule TAKE ONE CAPSULE BY MOUTH EVERY DAY BEFORE BREAKFAST  (Patient taking differently: Take 20 mg by mouth daily. BEFORE BREAKFAST)  . Oyster Shell Calcium 500 MG TABS Take 1 tablet (1,250 mg total) by mouth daily.  Marland Kitchen PARoxetine (PAXIL) 20 MG tablet Take 1 tablet (20 mg total) by mouth daily.  . predniSONE (DELTASONE) 1 MG tablet Take 3 tablets (3 mg total) by mouth daily.  . ranitidine (ZANTAC) 300 MG tablet Take 1 tablet (300 mg total) by mouth at bedtime.  . ReliOn Ultra Thin Lancets MISC 1 each by Does not apply route 4 (four) times daily. ICD code E11.65  . Respiratory Therapy Supplies (FLUTTER) DEVI Use as directed  . Spacer/Aero-Holding Chambers (AEROCHAMBER MV) inhaler Use as instructed  . spironolactone (ALDACTONE) 25 MG tablet Take 0.5 tablets (12.5 mg total) by mouth daily.  Marland Kitchen ULTICARE MICRO PEN NEEDLES 32G X 4 MM MISC USE AS DIRECTED  . verapamil (CALAN-SR) 180 MG CR tablet Take 2 tablets (360 mg total) by mouth at bedtime.   Current Facility-Administered Medications on File Prior to Visit  Medication  .  betamethasone acetate-betamethasone sodium phosphate (CELESTONE) injection 3 mg  . DOBUTamine (DOBUTREX) 1,000 mcg/mL in dextrose 5% 250 mL infusion  . umeclidinium bromide (INCRUSE ELLIPTA) 62.5 MCG/INH 1 puff    Review of Systems: Constitutional: No fever, sweats, or shaking chills.  No appetite or weight deficit.  She has a 20 pound weight loss over the past 18 months.  BMI is elevated.  Energy level is low; hydroxyzine to help sleep. Skin: No rash, scaling, sores, lumps, or jaundice. HEENT: No visual changes or hearing deficit; no macular degeneration or glaucoma. Pulmonary: No unusual cough, sore throat, or orthopnea; obstructive sleep apnea; bronchial asthma; DOE. Breasts: Fibrocystic breast disease. Cardiovascular: Coronary artery disease; no angina or myocardial infarction.  No cardiac dysrhythmia; essential hypertension and dyslipidemia; occasional subjective palpitations. Gastrointestinal: No indigestion, dysphagia, abdominal  pain, diarrhea, or constipation.  No nausea or vomiting.  No pain or difficulty in swallowing.  No change in bowel habits; NASH; GERD; normal brown stool while on ferrous sulfate; EGD 2 years ago; no prior colonoscopy. Genitourinary: No urinary frequency, urgency, gross hematuria, or dysuria; previous strep UTI. Musculoskeletal: Diffuse arthralgias and myalgias including the neck, lower back, fingers/hands, and knees; no joint swelling or deformity or instability. Hematologic: No bleeding tendency or easy bruisability. Endocrine: No intolerance to heat or cold; hypothyroid disease and insulin requiring diabetes mellitus. Vascular: No peripheral arterial or venous thromboembolic disease. Psychological: Chronic anxiety; chronic depression without suicidal ideation; no new mood changes. Neurological:  No headache, dizziness, lightheadedness, syncope, or near syncopal episodes; numbness or tingling in the hands. She ambulates with a cane due to her lower back compression fractures; sometimes uses a walker for distance.Marland Kitchen  Physical Examination: Vital Signs: Body surface area is 1.93 meters squared.  Vitals:   07/23/18 1104  BP: 132/77  Pulse: 68  Resp: 18  Temp: 98.2 F (36.8 C)  SpO2: 98%    Filed Weights   07/23/18 1104  Weight: 213 lb 11.2 oz (96.9 kg)  Body mass index is 50.18 kg/m. Constitutional:  Raven Turner is fully nourished and developed albeit overweight. She looks age appropriate. She is friendly and cooperative without respiratory compromise at rest. Skin: No rashes, scaling, dryness, jaundice, or itching. HEENT: Head is normocephalic and atraumatic.  Pupils are equal round and reactive to light and accommodation.  Sclerae are anicteric.  Conjunctivae are pale.  No sinus tenderness nor oropharyngeal lesions.  Lips without cracking or peeling; tongue without mass, inflammation, or nodularity.  Mucous membranes are moist.  Dentition is poor. Neck: Supple and symmetric.  No  jugular venous distention or thyromegaly.  Trachea is midline. Lymphatics: No cervical or supraclavicular lymphadenopathy.  No epitrochlear, axillary, or inguinal lymphadenopathy is appreciated. Respiratory/chest: Thorax is symmetrical.  Breath sounds are clear to auscultation and percussion.  Normal excursion and respiratory effort. Back: Symmetric without deformity or tenderness. Cardiovascular: Heart rate and rhythm are regular without murmurs or rubs. Gastrointestinal: Abdomen is soft, obese, nontender; no organomegaly.  Bowel sounds are normoactive.  No masses are appreciated. Extremities: Her legs are thick and large, but not edematous.  In the lower extremities, there is no asymmetric swelling, erythema, tenderness, or cord formation.  No clubbing, cyanosis, nor edema. Hematologic: No petechiae, hematomas, or ecchymoses. Psychological She is oriented to person, place, and time; normal affect. Neurological: There are no gross neurologic deficits.  Laboratory Results: July 23, 2018  Ref Range & Units 10:26 (07/23/18) 43moago (06/06/18) 123mogo (06/01/18) 59m46moo (05/12/18) 59mo69mo (05/09/18)  WBC Count 4.0 -  10.5 K/uL 10.6High   14.7High  R 16.4High  R 13.7High   14.7High  R  RBC 3.87 - 5.11 MIL/uL 4.25  4.19 R 4.29 R 3.73Low   3.87 R  Hemoglobin 12.0 - 15.0 g/dL 9.7Low   9.0Low  R 8.9Low  R 7.9Low   7.9Low  R  Comment: Reticulocyte Hemoglobin testing  may be clinically indicated,  consider ordering this additional  test [id number]   HCT 36.0 - 46.0 % 32.4Low   29.7Low  R 29.7Low  R 26.8Low   27.0Low  R  MCV 80.0 - 100.0 fL 76.2Low   70.9Low  R 69Low  R 71.8Low  R 70Low  R  MCH 26.0 - 34.0 pg 22.8Low   21.5Low  R 20.7Low  R 21.2Low   20.4Low  R  MCHC 30.0 - 36.0 g/dL 29.9Low   30.3Low  R 30.0Low  R 29.5Low   29.3Low  R  RDW 11.5 - 15.5 % 27.7High   21.9High  R 19.9High  R 18.1High   16.7High  R  Platelet Count 150 - 400 K/uL 425High   537High  R 568High  R 534High  CM 625High  R    nRBC 0.0 - 0.2 % 0.0         2wk ago (06/06/18) 10moago (05/12/18) 453mogo (02/09/18) 17m73moo (12/19/17) 17mo71mo (12/04/17)    Sodium 135 - 145 mmol/L 141  136  138 R 139 R 143 R  Potassium 3.5 - 5.1 mmol/L 3.9  3.9  4.4 R 4.5 R 4.1 R  Chloride 98 - 111 mmol/L 103  102  98 R 99 R 103 R  CO2 22 - 32 mmol/L _0 R 25 R 23 R  Glucose, Bld 70 - 99 mg/dL 121High   329High   128High  R 218High  R 109High  R  BUN 6 - 20 mg/dL _1 R 16 R 20 R  Creatinine, Ser 0.44 - 1.00 mg/dL 1.02High   1.02High   1.12High  R 1.05High  R 0.99 R  Calcium 8.9 - 10.3 mg/dL 9.7  8.9  9.4 R 9.4 R 9.2 R  Total Protein 6.5 - 8.1 g/dL 8.1  7.4      Albumin 3.5 - 5.0 g/dL 3.4Low   3.2Low       AST 15 - 41 U/L 28  31      ALT 0 - 44 U/L 39  34      Alkaline Phosphatase 38 - 126 U/L 122  102      Total Bilirubin 0.3 - 1.2 mg/dL <0.2Low   0.4      GFR calc non Af Amer >60 mL/min >60  >60  59Low  R 64 R 69 R  GFR calc Af Amer >60 mL/min >60  >60 CM 69 R 74 R 80 R   Path Review Reviewed By BassViolet BaldyD.   Comment: 09.26.19  MICROCYTIC ANEMIA.  LEUKOCYTOSIS, WITH NEUTROPHILIA AND MILD LEFT SHIFT.  THROMBOCYTOSIS.   Reticulocyte count 2.0%2.4% negative Folic acid 52.049.7amin B12 371 Ferritin 17 Iron/TIBC CRP 4.4 (<1.0) LDH 191 Serum protein electrophoresis, immunofixation, and light chain analysis: There was no monoclonal M spike identified on electrophoresis. Quantitative immunoglobulins:  IgG 1541 IgA 383 (87-252) IgM 133 Kappa free light chain 34 Lambda free light chain 27.8  Kappa/lambda light chain ratio 1.22 ANA negative Hemoglobin electrophoresis Hgb A2 Quant 1.8 - 3.2 % 1.9   Hgb  F Quant 0.0 - 2.0 % 0.0   Hgb S Quant 0.0 % 0.0   Hgb C 0.0 % 0.0 VC  Hgb A 96.4 - 98.8 % 98.1   Hgb Variant 0.0 % 0.0 VC  Please Note:  Comment VC  Comment: (NOTE)  Normal adult hemoglobin present.   JAK2 [id number], CALR and MPL: Not identified BCR-ABL1 translocation by [facility] t(9;22): Not  identified  Diagnostic/Imaging Studies: November 24, 2017 CT CHEST WITHOUT CONTRAST  COMPARISON: No priors.  FINDINGS: Cardiovascular: Heart size is borderline enlarged. There is no significant pericardial fluid, thickening or pericardial calcification. There is aortic atherosclerosis, as well as atherosclerosis of the great vessels of the mediastinum and the coronary arteries, including calcified atherosclerotic plaque in the left anterior descending coronary artery.  Mediastinum/Nodes: No pathologically enlarged mediastinal or hilar lymph nodes. Please note that accurate exclusion of hilar adenopathy is limited on noncontrast CT scans. Esophagus is unremarkable in appearance. No axillary lymphadenopathy.  Lungs/Pleura: High-resolution images demonstrate no significant regions of ground-glass attenuation, subpleural reticulation, parenchymal banding, traction bronchiectasis or frank honeycombing. Inspiratory and expiratory imaging demonstrates mild air trapping indicative of small airways disease. No acute consolidative airspace disease. No pleural effusions. No suspicious appearing pulmonary nodules or masses.  Upper Abdomen: Visualized portions of the upper abdomen demonstrate diffuse low attenuation throughout the visualized portions of the hepatic parenchyma, indicative of hepatic steatosis.  Musculoskeletal: Several chronic appearing compression fractures of inferior endplate of T3, superior endplate of T6, T7 vertebral body, superior endplate of T8, superior and inferior endplates of T9 and superior and inferior endplates of Q22, most severe at T9 where there is 60% loss of central vertebral body height. Multiple old healed bilateral rib fractures. There are no aggressive appearing lytic or blastic lesions noted in the visualized portions of the skeleton.  IMPRESSION: 1. No findings to suggest interstitial lung disease. 2. Mild air trapping indicative of mild  small airways disease. 3. Aortic atherosclerosis, in addition to left anterior descending coronary artery disease. Please note that although the presence of coronary artery calcium documents the presence of coronary artery disease, the severity of this disease and any potential stenosis cannot be assessed on this non-gated CT examination. Assessment for potential risk factor modification, dietary therapy or pharmacologic therapy may be warranted, if clinically indicated. 4. Hepatic steatosis. 5. Additional incidental findings, as above.  Aortic Atherosclerosis (ICD10-I70.0).  Vinnie Langton M.D. 11/24/2017 15:25  June 19, 2017 DIGITAL SCREENING BILATERAL MAMMOGRAM WITH CAD COMPARISON: Previous exam(s).  ACR Breast Density Category c: The breast tissue is heterogeneously dense, which may obscure small masses.  FINDINGS: There are no findings suspicious for malignancy. Images were processed with CAD.  IMPRESSION: No mammographic evidence of malignancy. A result letter of this screening mammogram will be mailed directly to the patient.  RECOMMENDATION: Screening mammogram in one year. (Code:[id number]-B-01Y)  BI-RADS CATEGORY 1: Negative.  Nolon Nations M.D. 06/19/2017 09:05  Summary/Assessment: In the setting of long-standing anemia over a lengthy, but indeterminate period of time secondary to iron deficiency, associated with persistent thrombocytosis and leukocytosis; status post parenteral iron on October 21, she presents now for the results of her lab work from today and recommendations.  Raven Turner has been aware of her anemia for many years.  She has been sporadically taking iron supplements for at least the past year consisting at present, of ferrous sulfate: 325 mg once daily.  It gives her intractable constipation.  While presenting to the emergency department at [facility] on May 12, 2018 a  complete blood count showed hemoglobin 7.9 hematocrit 26.8 MCV  71.8 MCH 21.2 RDW 18.1; platelets 534,000.    On an outpatient basis on June 01, 2018 complete blood count showed hemoglobin 8.9 hematocrit 29.7 MCV 69 MCH 20.7 RDW 19.9 WBC 16.4; platelets 568,000.  A look back much further into her medical record from August 26, 2014 a complete blood count showed hemoglobin 13.0 hematocrit 38.8 MCV 78 MCH 26 RDW 16.5 WBC 14.7; platelets 444,000.    On October 11, 2017 a complete blood count showed hemoglobin 10.3 hematocrit 32.0 MCV 75 RDW 16.9 WBC 12.1 with 66% neutrophils 23% lymphocytes 9% monocytes 2% eosinophils 1% basophil; platelets 453,000.  Prior to today's visit her most recent complete blood count shows hemoglobin 8.9 hematocrit 29.7 MCV 69 MCH 20.7 RDW 19.9 WBC 16.4; platelets 568,000.      She was hospitalized in 2015 for bronchitis.  In 2016 she was hospitalized for cholelithiasis.  She has chronic degenerative joint disease involving the neck, lower back, fingers, and knees.  She has compression fractures involving both the thoracic and lumbar spine. The results of her CT imaging from November 24, 2017 suggests extensive coronary artery disease/calcification.       At the time of her initial visit on September 25, a battery of laboratory studies were obtained to exclude any additional contributing factors to her anemia such as an underlying hemolytic process, paraproteinemia, myeloproliferative disorder, nutritional deficit, metabolic explanation, or autoimmune phenomenon.  Those results are detailed above.  We discussed initially the importance of identifying a cause or causes of her underlying anemia, thrombocytosis, and leukocytosis.  Not infrequently in the presence of iron deficiency anemia, there is a reactive thrombocytosis. If the anemia and the iron deficiency is corrected, generally the thrombocytopenia self-corrects.  That is exactly what has happened.  Her first dose of parenteral iron was on July 02, 2018.  Following that treatment, her  dizziness and lightheadedness have improved.  Her overall energy level remains low but stable.  She continues on daily prednisone now for several years because of her bronchial asthma.  In the interim since she reports no new problems or complaints. Her weight has increased 20 pounds over the past 2 months. Her energy level is chronically low.  She has no dizziness, lightheadedness, syncope, near syncopal episodes. She reports no unusual cough, sore throat, or orthopnea.  She has chronic but stable exertional dyspnea unchanged over her usual baseline. She denies pain or difficulty in swallowing. She has no fever, shaking chills, sweats, or flulike symptoms.  She denies heartburn or indigestion. She has no nausea, vomiting, diarrhea, or constipation.  She moves her bowels once daily.  Her dyspepsia is controlled with protein pump inhibitor therapy.  She reports no melena or bright red blood per rectum.  Her ankles swell slightly during the daytime and received overnight.  She complains of paresthesias involving the hands bilaterally.  There are no new focal areas of bone, joint, muscle pain.   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   Recommendation/Plan: The results of her laboratory studies were  reviewed and discussed in detail.  Those results are outlined above.  Her iron studies confirm the presence  of iron deficiency anemia.  Very importantly, there is no evidence of a myeloproliferative process such as polycythemia vera rubra or chronic myelogenous leukemia.  The results of her lab work from today suggest a modest improvement to 9.7 g/dL. Her white blood cell count is now within normal range, 10.6.  Likewise her platelet count is decreasing appropriately after infusional iron.  Both vitamin F74 and folic acid were within normal limits but on the low side of normal.  Both methylmalonic acid and serum homocysteine were normal. The ANA was negative. There was no evidence of thalassemia minor or intermedia.    Given her degree of anemia, her reticulocyte count at 2% is low, and suggests an inappropriate bone marrow response to her degree of anemia.  Ironically, her serum erythropoietin is high at 59.0. Her electrolytes were within normal range.  There was no hypercalcemia. The calculated GFR was normal.  The CRP was 4.7. Although very sensitive, the CRP is nonspecific.  We discussed previously that, given her complex history, having never had a colonoscopy in the past, and being iron deficient, endoscopic evaluation is warranted.  Although she menstruates on a regular basis, and has had no changes in her flow either duration or volume, she may be losing blood from a gastrointestinal source in addition.  She had an esophagogastroduodenoscopy 2 years earlier which failed to reveal any evidence of active bleeding. She should discuss a diagnostic colonoscopy with Dr. Margarita Rana with appropriate referral to gastroenterology, as she deems appropriate. I strongly recommend an endoscopic evaluation.  Because of her symptomatic iron deficiency, and previous lack of response to oral iron, parenteral iron will be administered later this week. She is now scheduled for ferumoxytol, beginning November 15.  She was also started vitamin B 50 complex in the setting of anticipated brisk erythropoiesis.  Her most recent  mammogram was 1 year earlier.  I recommend yearly surveillance.  This can be scheduled for Dr. Smitty Pluck office.   If she has not had a recent bimanual examination and/or Pap smear, it should be done.   He was advised to stay up-to-date on all of her immunizations including the seasonal influenza vaccine.  Barring any unforeseen complications, her next scheduled doctor visit with laboratory studies is on August 20, 2018.  Questions were answered to her satisfaction.  The total time spent discussing the results of her laboratory studies, role and rationale for continued parenteral iron replacement, schedule, and potential but uncommon side effects was 25 minutes. At least 50% of that time was spent in face-to-face discussion, reviewing outside records, laboratory evaluation, counseling, and answering questions.  There was ample time allotted to answer questions.  This note was dictated using voice activated technology/software.  Unfortunately, typographical errors are not uncommon, and transcription is subject to mistakes and regrettably misinterpretation.  If necessary, clarification of the above information can be discussed with me at any time.  FOLLOW UP: AS DIRECTED   cc:   Charlott Rakes, MD   Henreitta Leber, MD  Hematology/Oncology  [facility] [address] [place], [place] [address] Office: [phone] ZLDJ: [phone]

## 2018-07-23 NOTE — Telephone Encounter (Signed)
Scheduled appt per 11/11 los - gave patient AVS and calender per los.   

## 2018-07-23 NOTE — Patient Instructions (Signed)
We reviewed in detail the results of your laboratory studies from your last visit and today. The hemoglobin is increasing appropriately and the platelet count is decreasing toward normal.  The results of your iron studies are not yet available.  Based on your response so far, a repeat iron infusion has been scheduled on November 15.  You will be called for a time for that appointment.  As we discussed previously, it is recommended that you call Dr. Smitty Pluck office (nurse) to obtain a referral for a diagnostic colonoscopy given your iron deficiency.  Barring any unforeseen complications, your neck scheduled doctor visit with laboratory studies is on August 20, 2018.  Please do not hesitate to call should any new or untoward problems arise.

## 2018-07-27 ENCOUNTER — Inpatient Hospital Stay: Payer: Medicaid Other

## 2018-07-27 ENCOUNTER — Other Ambulatory Visit: Payer: Self-pay | Admitting: Hematology and Oncology

## 2018-07-27 ENCOUNTER — Ambulatory Visit
Admission: RE | Admit: 2018-07-27 | Discharge: 2018-07-27 | Disposition: A | Discharge status: Home / Self Care | Payer: Medicaid Other | Source: Ambulatory Visit | Attending: Family Medicine | Admitting: Family Medicine

## 2018-07-27 VITALS — BP 132/77 | HR 62 | Temp 98.6°F | Resp 19

## 2018-07-27 DIAGNOSIS — D509 Iron deficiency anemia, unspecified: Secondary | ICD-10-CM

## 2018-07-27 DIAGNOSIS — Z1231 Encounter for screening mammogram for malignant neoplasm of breast: Secondary | ICD-10-CM

## 2018-07-27 MED ORDER — SODIUM CHLORIDE 0.9 % IV SOLN
510.0000 mg | Freq: Once | INTRAVENOUS | Status: AC
  Administered 2018-07-27: 510 mg via INTRAVENOUS
  Filled 2018-07-27: qty 17

## 2018-07-27 MED ORDER — SODIUM CHLORIDE 0.9 % IV SOLN
Freq: Once | INTRAVENOUS | Status: AC
  Administered 2018-07-27: 09:00:00 via INTRAVENOUS
  Filled 2018-07-27: qty 250

## 2018-07-27 NOTE — Patient Instructions (Addendum)
Ferumoxytol injection Qu es este medicamento? El FERUMOXYTOL es un complejo de hierro. El hierro se utiliza para la produccin de glbulos rojos sanos, los cuales transportan el oxgeno y los nutrientes hacia todo el cuerpo. Este medicamento se utiliza para tratar la anemia por falta de hierro a las personas con enfermedad renal crnica. Este medicamento puede ser utilizado para otros usos; si tiene alguna pregunta consulte con su proveedor de atencin mdica o con su farmacutico. MARCAS COMUNES: Feraheme Qu le debo informar a mi profesional de la salud antes de tomar este medicamento? Necesita saber si usted presenta alguno de los siguientes problemas o situaciones: -anemia no provocada por niveles bajos de hierro -niveles altos de hierro en la sangre -examen de imgenes por resonancia magntica (IRM) programado -una reaccin alrgica o inusual al hierro, otros medicamentos, alimentos, colorantes o conservantes -si est embarazada o buscando quedar embarazada -si est amamantando a un beb Cmo debo utilizar este medicamento? Este medicamento se administra mediante inyeccin por va intravenosa. Lo administra un profesional de la salud en un hospital o en un entorno clnico. Hable con su pediatra para informarse acerca del uso de este medicamento en nios. Puede requerir atencin especial. Sobredosis: Pngase en contacto inmediatamente con un centro toxicolgico o una sala de urgencia si usted cree que haya tomado demasiado medicamento. ATENCIN: Este medicamento es solo para usted. No comparta este medicamento con nadie. Qu sucede si me olvido de una dosis? Es importante no olvidar ninguna dosis. Informe a su mdico o a su profesional de la salud si no puede asistir a una cita. Qu puede interactuar con este medicamento? Esta medicina puede interactuar con los siguientes medicamentos: -otros productos de hierro Puede ser que esta lista no menciona todas las posibles interacciones.  Informe a su profesional de la salud de todos los productos a base de hierbas, medicamentos de venta libre o suplementos nutritivos que est tomando. Si usted fuma, consume bebidas alcohlicas o si utiliza drogas ilegales, indqueselo tambin a su profesional de la salud. Algunas sustancias pueden interactuar con su medicamento. A qu debo estar atento al usar este medicamento? Visite a su mdico o a su profesional de la salud de manera regular. Si los sntomas no comienzan a mejorar o si empeoran, consulte con su mdico o con su profesional de la salud. Tal vez necesita realizarse anlisis de sangre mientras recibe este medicamento. Tal vez necesita seguir una dieta especial. Consulte a su mdico. Los alimentos que contienen hierro incluyen: alimentos integrales o con cereales, frutas secas, frijoles o arvejas, vegetales de hoja verde y carne que proviene de rganos (hgado, rin). Qu efectos secundarios puedo tener al utilizar este medicamento? Efectos secundarios que debe informar a su mdico o a su profesional de la salud tan pronto como sea posible: reacciones alrgicas, como erupcin cutnea, picazn o urticarias, e hinchazn de la cara, los labios o la lengua problemas respiratorios cambios en la presin sangunea sensacin de desmayos o aturdimiento, cadas fiebre o escalofros enrojecimiento, sudoracin o sensacin de calor hinchazn de tobillos o pies Efectos secundarios que generalmente no requieren atencin mdica (infrmelos a su mdico o a su profesional de la salud si persisten o si son molestos): diarrea dolor de cabeza nuseas, vmito dolor estomacal Puede ser que esta lista no menciona todos los posibles efectos secundarios. Comunquese a su mdico por asesoramiento mdico sobre los efectos secundarios. Usted puede informar los efectos secundarios a la FDA por telfono al 1-800-FDA-1088. Dnde debo guardar mi medicina? Este medicamento se administra en hospitales   o clnicas y no  necesitar guardarlo en su domicilio. ATENCIN: Este folleto es un resumen. Puede ser que no cubra toda la posible informacin. Si usted tiene preguntas acerca de esta medicina, consulte con su mdico, su farmacutico o su profesional de la salud.  2018 [employer]vier/[employer] Standard (2015-12-14 00:00:00)  

## 2018-08-01 ENCOUNTER — Telehealth: Payer: Self-pay | Admitting: Family Medicine

## 2018-08-01 MED ORDER — METHOCARBAMOL 750 MG PO TABS: 750.0000 mg | ORAL_TABLET | Freq: Three times a day (TID) | ORAL | 0 refills | Status: DC

## 2018-08-01 NOTE — Telephone Encounter (Signed)
1) Medication(s) Requested (by name): methocarbamol 2) Pharmacy of Choice:  [facility] [address]

## 2018-08-06 ENCOUNTER — Telehealth: Payer: Self-pay | Admitting: Family Medicine

## 2018-08-06 DIAGNOSIS — M545 Low back pain, unspecified: Secondary | ICD-10-CM

## 2018-08-06 DIAGNOSIS — G8929 Other chronic pain: Secondary | ICD-10-CM

## 2018-08-06 NOTE — Telephone Encounter (Signed)
Patient called because she would like to be referred to a new pain management clinic. Patient says current pain clinic closed on 11/11. Please follow up with patient.

## 2018-08-06 NOTE — Telephone Encounter (Signed)
Will route to PCP for review. 

## 2018-08-07 NOTE — Telephone Encounter (Signed)
Referral has been placed. 

## 2018-08-08 ENCOUNTER — Telehealth: Payer: Self-pay | Admitting: Pulmonary Disease

## 2018-08-08 NOTE — Telephone Encounter (Signed)
I called pt # is not in service. I will look into this further Fri.Marland Kitchen

## 2018-08-08 NOTE — Telephone Encounter (Signed)
Called the patient and unable to reach. Unable to leave voicemail. Will route to TS to follow up on.

## 2018-08-10 NOTE — Telephone Encounter (Signed)
Called pt again today, # is not in service. That's the only ph. # we have for her.

## 2018-08-15 ENCOUNTER — Encounter: Payer: Self-pay | Admitting: *Deleted

## 2018-08-15 NOTE — Telephone Encounter (Signed)
I called pt's spouse and his # is also out of service. I sent pt a My chart message.. If that doesn't work I'm going to send her an e-mail.

## 2018-08-15 NOTE — Telephone Encounter (Signed)
Tammy, please advise if you were ever able to reach pt to get Dupixent taken care of. Thanks!

## 2018-08-17 NOTE — Telephone Encounter (Signed)
I sent pt another my chart message. Tell her again I have checked both # several times and can not get thru. I explained to her we will have to communicate thru my chart and she can communicate with [facility] thru e-mail. Their e-mal is: [email or web address].. Will wait to hear from pt.Marland Kitchen

## 2018-08-20 NOTE — Telephone Encounter (Signed)
I called patient through the interpreter ID [phone]. Was able to get ahold of patient. I let her know there is a 2nd message in [place] from [facility], but the email was not placed in the MyChart message, so I sent another MyChart message with the email TS meant for patient to use for assistance.   However, after speaking with patient on phone she states she has been getting her medication since the first MyChart message sent by TS.  Nothing further needed. Let patient know that if she needed anything further to call our Office or send a MyChart message.

## 2018-08-21 ENCOUNTER — Inpatient Hospital Stay: Payer: Medicaid Other

## 2018-08-21 ENCOUNTER — Telehealth: Payer: Self-pay

## 2018-08-21 ENCOUNTER — Encounter: Payer: Self-pay | Admitting: Hematology and Oncology

## 2018-08-21 ENCOUNTER — Inpatient Hospital Stay: Payer: Medicaid Other | Attending: Hematology and Oncology | Admitting: Hematology and Oncology

## 2018-08-21 VITALS — BP 120/58 | HR 76 | Temp 98.9°F | Resp 18 | Ht <= 58 in | Wt 212.4 lb

## 2018-08-21 DIAGNOSIS — K7689 Other specified diseases of liver: Secondary | ICD-10-CM

## 2018-08-21 DIAGNOSIS — D72829 Elevated white blood cell count, unspecified: Secondary | ICD-10-CM

## 2018-08-21 DIAGNOSIS — R202 Paresthesia of skin: Secondary | ICD-10-CM | POA: Diagnosis not present

## 2018-08-21 DIAGNOSIS — E119 Type 2 diabetes mellitus without complications: Secondary | ICD-10-CM

## 2018-08-21 DIAGNOSIS — I10 Essential (primary) hypertension: Secondary | ICD-10-CM | POA: Diagnosis not present

## 2018-08-21 DIAGNOSIS — D509 Iron deficiency anemia, unspecified: Secondary | ICD-10-CM | POA: Diagnosis present

## 2018-08-21 DIAGNOSIS — E039 Hypothyroidism, unspecified: Secondary | ICD-10-CM | POA: Diagnosis not present

## 2018-08-21 DIAGNOSIS — I2510 Atherosclerotic heart disease of native coronary artery without angina pectoris: Secondary | ICD-10-CM | POA: Insufficient documentation

## 2018-08-21 DIAGNOSIS — K760 Fatty (change of) liver, not elsewhere classified: Secondary | ICD-10-CM | POA: Insufficient documentation

## 2018-08-21 DIAGNOSIS — M1990 Unspecified osteoarthritis, unspecified site: Secondary | ICD-10-CM | POA: Insufficient documentation

## 2018-08-21 DIAGNOSIS — M8580 Other specified disorders of bone density and structure, unspecified site: Secondary | ICD-10-CM | POA: Insufficient documentation

## 2018-08-21 DIAGNOSIS — D473 Essential (hemorrhagic) thrombocythemia: Secondary | ICD-10-CM | POA: Diagnosis not present

## 2018-08-21 DIAGNOSIS — E785 Hyperlipidemia, unspecified: Secondary | ICD-10-CM | POA: Diagnosis not present

## 2018-08-21 DIAGNOSIS — N200 Calculus of kidney: Secondary | ICD-10-CM | POA: Insufficient documentation

## 2018-08-21 DIAGNOSIS — G4700 Insomnia, unspecified: Secondary | ICD-10-CM | POA: Insufficient documentation

## 2018-08-21 DIAGNOSIS — I700 Atherosclerosis of aorta: Secondary | ICD-10-CM | POA: Insufficient documentation

## 2018-08-21 DIAGNOSIS — Z79899 Other long term (current) drug therapy: Secondary | ICD-10-CM

## 2018-08-21 DIAGNOSIS — R1013 Epigastric pain: Secondary | ICD-10-CM | POA: Insufficient documentation

## 2018-08-21 DIAGNOSIS — R42 Dizziness and giddiness: Secondary | ICD-10-CM | POA: Diagnosis not present

## 2018-08-21 DIAGNOSIS — N920 Excessive and frequent menstruation with regular cycle: Secondary | ICD-10-CM | POA: Diagnosis not present

## 2018-08-21 DIAGNOSIS — E559 Vitamin D deficiency, unspecified: Secondary | ICD-10-CM | POA: Insufficient documentation

## 2018-08-21 DIAGNOSIS — J45909 Unspecified asthma, uncomplicated: Secondary | ICD-10-CM

## 2018-08-21 DIAGNOSIS — F329 Major depressive disorder, single episode, unspecified: Secondary | ICD-10-CM | POA: Insufficient documentation

## 2018-08-21 DIAGNOSIS — D539 Nutritional anemia, unspecified: Secondary | ICD-10-CM

## 2018-08-21 DIAGNOSIS — Z7982 Long term (current) use of aspirin: Secondary | ICD-10-CM | POA: Insufficient documentation

## 2018-08-21 DIAGNOSIS — M7989 Other specified soft tissue disorders: Secondary | ICD-10-CM

## 2018-08-21 DIAGNOSIS — Z794 Long term (current) use of insulin: Secondary | ICD-10-CM | POA: Diagnosis not present

## 2018-08-21 LAB — CBC WITH DIFFERENTIAL (CANCER CENTER ONLY)
Abs Immature Granulocytes: 0.13 10*3/uL — ABNORMAL HIGH (ref 0.00–0.07)
Basophils Absolute: 0.1 10*3/uL (ref 0.0–0.1)
Basophils Relative: 1 %
Eosinophils Absolute: 0.5 10*3/uL (ref 0.0–0.5)
Eosinophils Relative: 4 %
HCT: 35.2 % — ABNORMAL LOW (ref 36.0–46.0)
Hemoglobin: 10.8 g/dL — ABNORMAL LOW (ref 12.0–15.0)
Immature Granulocytes: 1 %
Lymphocytes Relative: 33 %
Lymphs Abs: 4.4 10*3/uL — ABNORMAL HIGH (ref 0.7–4.0)
MCH: 24.5 pg — ABNORMAL LOW (ref 26.0–34.0)
MCHC: 30.7 g/dL (ref 30.0–36.0)
MCV: 79.8 fL — ABNORMAL LOW (ref 80.0–100.0)
Monocytes Absolute: 0.8 10*3/uL (ref 0.1–1.0)
Monocytes Relative: 6 %
Neutro Abs: 7.5 10*3/uL (ref 1.7–7.7)
Neutrophils Relative %: 55 %
Platelet Count: 400 10*3/uL (ref 150–400)
RBC: 4.41 MIL/uL (ref 3.87–5.11)
RDW: 25.5 % — ABNORMAL HIGH (ref 11.5–15.5)
WBC Count: 13.4 10*3/uL — ABNORMAL HIGH (ref 4.0–10.5)
nRBC: 0 % (ref 0.0–0.2)

## 2018-08-21 LAB — URINALYSIS, COMPLETE (UACMP) WITH MICROSCOPIC
Bilirubin Urine: NEGATIVE
Glucose, UA: NEGATIVE mg/dL
Ketones, ur: NEGATIVE mg/dL
Leukocytes, UA: NEGATIVE
Nitrite: NEGATIVE
Protein, ur: 30 mg/dL — AB
Specific Gravity, Urine: 1.011 (ref 1.005–1.030)
pH: 5 (ref 5.0–8.0)

## 2018-08-21 LAB — FERRITIN: Ferritin: 273 ng/mL (ref 11–307)

## 2018-08-21 NOTE — Patient Instructions (Signed)
We discussed in detail through a translator the results of your laboratory studies from today.  Your hemoglobin has improved but is still low.  Your iron studies are somewhat inconclusive since you received iron on November 15.  You are given copies of the labs for your review.  Because her white blood cell count is elevated, a urine specimen will be obtained for urinalysis and urine culture and sensitivity.  Call us for those results within 24 hours unless notified sooner.  Ask for Dr. Payton Spark nurse for those results and instructions.  A follow-up appointment has been scheduled for December 23 to meet your new provider since I am leaving the practice.  At that time lab work will be obtained.  Please do not hesitate to call should any new or untoward problems arise.  Ladona Ridgel, MD Hematology/Oncology

## 2018-08-21 NOTE — Progress Notes (Signed)
Hematology/Oncology Outpatient Progress Note  Patient Name:  Raven Turner  DOB: 1971-03-21   Date of Service: August 21, 2018  Referring Provider: Charlott Rakes, MD  [place], [place] [address]    Consulting Physician: Henreitta Leber, MD Hematology/Oncology  Reason for Visit: In the setting of long-standing anemia over a lengthy, but indeterminate period of time secondary to iron deficiency, associated with persistent thrombocytosis and leukocytosis; status post parenteral iron on October 21 and November 15, she presents now for the results of her lab work from today and recommendations.  Brief History: Raven Turner is a 47 year old gravida 3 para 3 premenopausal, resident of [place], originally from [place], whose past medical history is significant for insulin requiring diabetes mellitus; primary hypertension for the past 20 years; dyslipidemia; coronary artery disease; hypothyroid disease for the past 18 months; bronchial asthma since childhood; nonalcoholic hepatosteatosis; vitamin D deficiency on replacement therapy; pituitary microadenoma; elevated BMI; obstructive sleep apnea; gastroesophageal reflux disease; sleep disturbance; peripheral neuropathy involving the hands and fingers; degenerative joint disease involving the neck, lower back, knees bilaterally, fingers/hands bilaterally; anxiety disorder; mild depression; osteopenia; and iron deficiency anemia.  She understands and speaks a very minimal amount of [place]. There is a Optometrist by way of Skype present at this visit, Leanna Sato.    Raven Turner has been aware of her anemia for many years.  She has been taking iron supplements for at least the past year consisting at present, of ferrous sulfate: 325 mg once daily.  While presenting to the emergency department at [facility] on May 12, 2018 a complete blood count showed hemoglobin 7.9 hematocrit 26.8 MCV 71.8 MCH 21.2 RDW 18.1;  platelets 534,000.  On an outpatient basis on June 01, 2018 complete blood count showed hemoglobin 8.9 hematocrit 29.7 MCV 69 MCH 20.7 RDW 19.9 WBC 16.4; platelets 568,000.    A look back much further into her medical record from August 26, 2014 a complete blood count showed hemoglobin 13.0 hematocrit 38.8 MCV 78 MCH 26 RDW 16.5 WBC 14.7; platelets 444,000.  On September 08, 2014 an HIV antibody was negative.  On September 15, 2014 a complete blood count showed hemoglobin 13.4 hematocrit 39.4 WBC 18.5 with 90% neutrophils 6% lymphocytes 4% monocytes; platelets 441,000.  On September 13, 2015 a complete blood count showed hemoglobin 13.0 hematocrit 39.8 WBC 11.1; platelets 453,000.  More recently on October 11, 2017 a complete blood count showed hemoglobin 10.3 hematocrit 32.0 MCV 75 RDW 16.9 WBC 12.1 with 66% neutrophils 23% lymphocytes 9% monocytes 2% eosinophils 1% basophil; platelets 453,000.  Prior to today's visit her most recent complete blood count shows hemoglobin 8.9 hematocrit 29.7 MCV 69 MCH 20.7 RDW 19.9 WBC 16.4; platelets 568,000. She was hospitalized in 2015 for bronchitis.  In 2016 she was hospitalized for cholelithiasis. She has chronic degenerative joint disease involving the neck, lower back, fingers, and knees. She has compression fractures involving both the thoracic and lumbar spine. The results of her CT imaging from November 24, 2017 suggests extensive coronary artery disease/calcification.    On September 25, a battery of laboratory studies were obtained to exclude any additional contributing factors to her anemia such as an underlying hemolytic process, paraproteinemia, myeloproliferative disorder, nutritional deficit, metabolic explanation, or autoimmune phenomenon.    At the time of her initial visit, we discussed also the importance of identifying a cause or causes of her underlying anemia, thrombocytosis, and leukocytosis.  Not infrequently iron deficiency anemia is associated  with a reactive  thrombocytosis.  If the anemia and the iron deficiency is corrected, generally the thrombocytosis self-corrects.  That is exactly what has happened.  Her first dose of parenteral iron was on July 02, 2018.  Following that treatment, her dizziness and lightheadedness have improved.  Her overall energy level remains low but stable.  She continues on daily prednisone now for several years because of her bronchial asthma.  Her first infusion with ferumoxytol was on October 21. Her second infusion was on November 15.  She had no adverse reaction. She felt only slightly better following her second treatment. Since her last visit, she was started on hydrocodone/APAP: 5/325: 1 tablet twice daily. She notices only a minimal improvement in her overall sense of well-being.  We discussed the possibility of having a diagnostic colonoscopy, which she has never had.  Given her complex history, a colonoscopy is warranted due to her protracted iron deficiency anemia.  Her menses continue but are relatively heavy in flow lasting 7-8 days.  She had an esophagogastroduodenoscopy 2 years earlier which failed to reveal any evidence of active bleeding.  It is with this background she presents now for repeat laboratory evaluation with recommendations in the setting of long-standing anemia of iron deficiency with likely transient reactive thrombocytosis as outlined above.  Interval History: In the interim since she reports no new problems or complaints. Her appetite and weight remain stable. Her energy level is chronically low. Her last menstruation started on December 5.  It lasted 7-8 days. Her first 5 days are heavy. She has no dizziness, lightheadedness, syncope, near syncopal episodes. She reports no unusual cough, sore throat, or orthopnea.  She has chronic but stable exertional dyspnea unchanged over her usual baseline. She denies pain or difficulty in swallowing. She has no fever, shaking chills, sweats, or  flulike symptoms.  She denies heartburn or indigestion. She has no nausea, vomiting, diarrhea, or constipation. She moves her bowels once daily.  Her dyspepsia is controlled with protein pump inhibitor therapy. She reports no melena or bright red blood per rectum. Her ankles swell slightly during the daytime and received overnight. She complains of paresthesias involving the hands bilaterally.  There are no new focal areas of bone, joint, muscle pain.   Past Medical History Reviewed        Family History Reviewed       Social History Reviewed  Allergies  Allergen Reactions  . Shellfish Allergy Anaphylaxis  . Septra [Sulfamethoxazole-Trimethoprim] Rash, Other (See Comments) and Itching    Facial redness with pruritus  Facial redness with pruritus   . Hydrocortisone Rash  No seasonal allergies  Current Outpatient Medications on File Prior to Visit  Medication Sig  . albuterol (PROVENTIL HFA;VENTOLIN HFA) 108 (90 Base) MCG/ACT inhaler Inhale 2 puffs into the lungs every 4 (four) hours as needed for wheezing or shortness of breath (((PLAN B))). (Patient taking differently: Inhale 2 puffs into the lungs every 4 (four) hours as needed for wheezing or shortness of breath. )  . albuterol (PROVENTIL) (2.5 MG/3ML) 0.083% nebulizer solution USE 1 VIAL VIA NEBULIZER EVERY 4 HOURS AS NEEDED FOR WHEEZING OR SHORTNESS OF BREATH (Patient taking differently: Take 2.5 mg by nebulization every 4 (four) hours as needed for wheezing or shortness of breath. )  . aspirin EC 81 MG tablet Take 1 tablet (81 mg total) by mouth daily.  Marland Kitchen atorvastatin (LIPITOR) 40 MG tablet Take 1 tablet (40 mg total) by mouth daily.  . Blood Glucose Monitoring Suppl (ACCU-CHEK AVIVA PLUS)  W/DEVICE KIT Used as directed  . buprenorphine (BUTRANS - DOSED MCG/HR) 5 MCG/HR PTWK patch Place 5 mcg onto the skin once a week.  . cabergoline (DOSTINEX) 0.5 MG tablet Take 0.5 tablets (0.25 mg total) by mouth 2 (two) times a week.  . cholecalciferol  (VITAMIN D) 1000 UNITS tablet Take 1,000 Units by mouth every morning.  . cloNIDine (CATAPRES) 0.2 MG tablet TAKE 1 TABLET(0.2 MG) BY MOUTH THREE TIMES DAILY  . DULERA 200-5 MCG/ACT AERO INHALE 2 PUFFS INTO THE LUNGS TWICE DAILY  . DUPIXENT 300 MG/2ML SOSY Inject 300 mg into the skin See admin instructions. Inject under skin every 15 days  . furosemide (LASIX) 20 MG tablet Take 1 tablet (20 mg total) by mouth every morning.  . gabapentin (NEURONTIN) 400 MG capsule Take 2 capsules (800 mg total) by mouth 3 (three) times daily.  Marland Kitchen gentamicin cream (GARAMYCIN) 0.1 % Apply 1 application topically 2 (two) times daily.  Marland Kitchen glucose blood (RELION GLUCOSE TEST STRIPS) test strip 1 each by Other route 4 (four) times daily. ICD code E11.65  . HUMULIN R U-500 KWIKPEN 500 UNIT/ML kwikpen Inject 120-180 Units into the skin 3 (three) times daily. 180 U before breakfast, 160 U before lunch, 140 U before dinner  . hydrOXYzine (ATARAX/VISTARIL) 25 MG tablet TAKE 1 TABLET BY MOUTH AT BEDTIME AS NEEDED FOR ANXIETY. (Patient taking differently: Take 25 mg by mouth at bedtime as needed for anxiety. )  . Insulin Pen Needle (B-D ULTRAFINE III SHORT PEN) 31G X 8 MM MISC 1 application by Does not apply route daily.  . Insulin Syringe-Needle U-100 (BD INSULIN SYRINGE ULTRAFINE) 31G X 15/64" 1 ML MISC 1 each by Does not apply route 3 (three) times daily.  Elmore Guise Devices (ACCU-CHEK SOFTCLIX) lancets Use as instructed  . levothyroxine (SYNTHROID, LEVOTHROID) 25 MCG tablet Take 25 mcg by mouth daily.  Marland Kitchen losartan (COZAAR) 100 MG tablet Take 1 tablet (100 mg total) by mouth every morning.  . metFORMIN (GLUCOPHAGE) 1000 MG tablet TAKE 1 TABLET BY MOUTH TWICE DAILY WITH A MEAL (Patient taking differently: Take 1,000 mg by mouth 2 (two) times daily with a meal. )  . methocarbamol (ROBAXIN) 750 MG tablet Take 1 tablet (750 mg total) by mouth 3 (three) times daily. MUST MAKE APPT FOR FURTHER REFILLS  . Misc. Devices (CANE) MISC 1 each  by Does not apply route daily.  . montelukast (SINGULAIR) 10 MG tablet Take 1 tablet (10 mg total) by mouth at bedtime.  Marland Kitchen omeprazole (PRILOSEC) 20 MG capsule TAKE ONE CAPSULE BY MOUTH EVERY DAY BEFORE BREAKFAST (Patient taking differently: Take 20 mg by mouth daily. BEFORE BREAKFAST)  . Oyster Shell Calcium 500 MG TABS Take 1 tablet (1,250 mg total) by mouth daily.  Marland Kitchen PARoxetine (PAXIL) 20 MG tablet Take 1 tablet (20 mg total) by mouth daily.  . predniSONE (DELTASONE) 1 MG tablet Take 3 tablets (3 mg total) by mouth daily.  . ranitidine (ZANTAC) 300 MG tablet Take 1 tablet (300 mg total) by mouth at bedtime.  . ReliOn Ultra Thin Lancets MISC 1 each by Does not apply route 4 (four) times daily. ICD code E11.65  . Respiratory Therapy Supplies (FLUTTER) DEVI Use as directed  . Spacer/Aero-Holding Chambers (AEROCHAMBER MV) inhaler Use as instructed  . spironolactone (ALDACTONE) 25 MG tablet Take 0.5 tablets (12.5 mg total) by mouth daily.  Marland Kitchen ULTICARE MICRO PEN NEEDLES 32G X 4 MM MISC USE AS DIRECTED  . verapamil (CALAN-SR) 180 MG CR  tablet Take 2 tablets (360 mg total) by mouth at bedtime.   Current Facility-Administered Medications on File Prior to Visit  Medication  . betamethasone acetate-betamethasone sodium phosphate (CELESTONE) injection 3 mg  . DOBUTamine (DOBUTREX) 1,000 mcg/mL in dextrose 5% 250 mL infusion  . umeclidinium bromide (INCRUSE ELLIPTA) 62.5 MCG/INH 1 puff    Review of Systems: Constitutional: No fever, sweats, or shaking chills.  No appetite or weight deficit.  She has a 20 pound weight loss over the past 18 months.  BMI is elevated.  Energy level is low; hydroxyzine to help sleep. Skin: No rash, scaling, sores, lumps, or jaundice. HEENT: No visual changes or hearing deficit; no macular degeneration or glaucoma. Pulmonary: No unusual cough, sore throat, or orthopnea; obstructive sleep apnea; bronchial asthma; DOE. Breasts: Fibrocystic breast disease. Cardiovascular:  Coronary artery disease; no angina or myocardial infarction.  No cardiac dysrhythmia; essential hypertension and dyslipidemia; occasional subjective palpitations. Gastrointestinal: No indigestion, dysphagia, abdominal pain, diarrhea, or constipation.  No nausea or vomiting.  No pain or difficulty in swallowing.  No change in bowel habits; NASH; GERD; normal brown stool while on ferrous sulfate; EGD 2 years ago; no prior colonoscopy. Genitourinary: No urinary frequency, urgency, gross hematuria, or dysuria; previous strep UTI. Musculoskeletal: Diffuse arthralgias and myalgias including the neck, lower back, fingers/hands, and knees; no joint swelling or deformity or instability. Hematologic: No bleeding tendency or easy bruisability. Endocrine: No intolerance to heat or cold; hypothyroid disease and insulin requiring diabetes mellitus. Vascular: No peripheral arterial or venous thromboembolic disease. Psychological: Chronic anxiety; chronic depression without suicidal ideation; no new mood changes. Neurological:  No headache, dizziness, lightheadedness, syncope, or near syncopal episodes; numbness or tingling in the hands. She ambulates with a cane due to her lower back compression fractures; sometimes uses a walker for distance.Marland Kitchen  Physical Examination: Vital Signs: Body surface area is 1.92 meters squared.  Vitals:   08/21/18 0948  BP: (!) 120/58  Pulse: 76  Resp: 18  Temp: 98.9 F (37.2 C)  SpO2: 99%    Filed Weights   08/21/18 0948  Weight: 212 lb 6.4 oz (96.3 kg)  Body mass index is 51.21 kg/m. Constitutional:  Raven Turner is fully nourished and developed albeit overweight. She looks age appropriate. She is friendly and cooperative without respiratory compromise at rest. Skin: No rashes, scaling, dryness, jaundice, or itching. HEENT: Head is normocephalic and atraumatic. Pupils are equal round and reactive to light and accommodation.  Sclerae are anicteric.  Conjunctivae are  pale.  No sinus tenderness nor oropharyngeal lesions.  Lips without cracking or peeling; tongue without mass, inflammation, or nodularity.  Mucous membranes are moist.  Dentition is poor. Neck: Supple and symmetric.  No jugular venous distention or thyromegaly.  Trachea is midline. Lymphatics: No cervical or supraclavicular lymphadenopathy.  No epitrochlear, axillary, or inguinal lymphadenopathy is appreciated. Respiratory/chest: Thorax is symmetrical.  Breath sounds are clear to auscultation and percussion.  Normal excursion and respiratory effort. Back: Symmetric without deformity or tenderness. Cardiovascular: Heart rate and rhythm are regular without murmurs or rubs. Gastrointestinal: Abdomen is soft, obese, nontender; no organomegaly.  Bowel sounds are normoactive.  No masses are appreciated. Extremities: Her legs are thick and large, but not edematous.  In the lower extremities, there is no asymmetric swelling, erythema, tenderness, or cord formation. No clubbing, cyanosis, nor edema. Hematologic: No petechiae, hematomas, or ecchymoses. Psychological She is oriented to person, place, and time; normal affect. Neurological: There are no gross neurologic deficits.  Laboratory Results: August 21, 2018  Ref Range & Units 09:28 (08/21/18) 4wk ago (07/23/18) 77moago (06/06/18) 229mogo (06/01/18) 79m60moo (05/12/18)  WBC Count 4.0 - 10.5 K/uL 13.4High   10.6High   14.7High  R 16.4High  R 13.7High    RBC 3.87 - 5.11 MIL/uL 4.41  4.25  4.19 R 4.29 R 3.73Low    Hemoglobin 12.0 - 15.0 g/dL 10.8Low   9.7Low  CM 9.0Low  R 8.9Low  R 7.9Low    HCT 36.0 - 46.0 % 35.2Low   32.4Low   29.7Low  R 29.7Low  R 26.8Low    MCV 80.0 - 100.0 fL 79.8Low   76.2Low   70.9Low  R 69Low  R 71.8Low  R  MCH 26.0 - 34.0 pg 24.5Low   22.8Low   21.5Low  R 20.7Low  R 21.2Low    MCHC 30.0 - 36.0 g/dL 30.7  29.9Low   30.3Low  R 30.0Low  R 29.5Low    RDW 11.5 - 15.5 % 25.5High   27.7High   21.9High  R 19.9High  R 18.1High      Platelet Count 150 - 400 K/uL 400  425High   537High  R 568High  R 534High  CM  nRBC 0.0 - 0.2 % 0.0  0.0      Neutrophils Relative % % 55  52  75     Neutro Abs 1.7 - 7.7 K/uL 7.5  5.6  11.1High  R    Lymphocytes Relative % 33  36  18     Lymphs Abs 0.7 - 4.0 K/uL 4.4High   3.8  2.6 R    Monocytes Relative % _0 Monocytes Absolute 0.1 - 1.0 K/uL 0.8  0.7  0.8 R    Eosinophils Relative % _1 Eosinophils Absolute 0.0 - 0.5 K/uL 0.5  0.4  0.2     Basophils Relative % 1  1  0     Basophils Absolute 0.0 - 0.1 K/uL 0.1  0.1  0.1 CM    WBC Morphology  OC META AND MYELOCYTE       RBC Morphology  OC RBC FRAGMENT       Immature Granulocytes % 1  1      Abs Immature Granulocytes 0.00 - 0.07 K/uL 0.13High   0.07      Ovalocytes  PRESENT  PRESENT CM     Ferritin 273  Ref Range & Units 11:35  Color, Urine YELLOW YELLOW   APPearance CLEAR CLEAR   Specific Gravity, Urine 1.005 - 1.030 1.011   pH 5.0 - 8.0 5.0   Glucose, UA NEGATIVE mg/dL NEGATIVE   Hgb urine dipstick NEGATIVE LARGEAbnormal    Bilirubin Urine NEGATIVE NEGATIVE   Ketones, ur NEGATIVE mg/dL NEGATIVE   Protein, ur NEGATIVE mg/dL 30Abnormal    Nitrite NEGATIVE NEGATIVE   Leukocytes, UA NEGATIVE NEGATIVE   RBC / HPF 0 - 5 RBC/hpf 11-20   WBC, UA 0 - 5 WBC/hpf 0-5   Bacteria, UA NONE SEEN RAREAbnormal    Squamous Epithelial / LPF 0 - 5 0-5   Mucus  PRESENT    July 23, 2018  Ref Range & Units 4wk ago  Sodium 135 - 145 mmol/L 143   Potassium 3.5 - 5.1 mmol/L 3.9   Chloride 98 - 111 mmol/L 106   CO2 22 - 32 mmol/L 27   Glucose, Bld 70 - 99 mg/dL 91   BUN  6 - 20 mg/dL 13   Creatinine 0.44 - 1.00 mg/dL 1.09High    Calcium 8.9 - 10.3 mg/dL 9.5   GFR, Est Non Af Am >60 mL/min 60Low    GFR, Est AFR Am >60 mL/min >60    Path Review Reviewed By Violet Baldy, M.D.   Comment: 09.26.19  MICROCYTIC ANEMIA.  LEUKOCYTOSIS, WITH NEUTROPHILIA AND MILD LEFT SHIFT.  THROMBOCYTOSIS.   Reticulocyte count 2.0% DAT  negative Folic acid 81.8 Vitamin B12 371 Ferritin 17 Iron/TIBC CRP 4.4 (<1.0) LDH 191 Serum protein electrophoresis, immunofixation, and light chain analysis: There was no monoclonal M spike identified on electrophoresis. Quantitative immunoglobulins:  IgG 1541 IgA 383 (87-252) IgM 133 Kappa free light chain 34 Lambda free light chain 27.8  Kappa/lambda light chain ratio 1.22 ANA negative Hemoglobin electrophoresis Hgb A2 Quant 1.8 - 3.2 % 1.9   Hgb F Quant 0.0 - 2.0 % 0.0   Hgb S Quant 0.0 % 0.0   Hgb C 0.0 % 0.0 VC  Hgb A 96.4 - 98.8 % 98.1   Hgb Variant 0.0 % 0.0 VC  Please Note:  Comment VC  Comment: (NOTE)  Normal adult hemoglobin present.   JAK2 V617F, CALR and MPL: Not identified BCR-ABL1 translocation by [facility] t(9;22): Not identified  Diagnostic/Imaging Studies: November 24, 2017 CT CHEST WITHOUT CONTRAST  COMPARISON: No priors.  FINDINGS: Cardiovascular: Heart size is borderline enlarged. There is no significant pericardial fluid, thickening or pericardial calcification. There is aortic atherosclerosis, as well as atherosclerosis of the great vessels of the mediastinum and the coronary arteries, including calcified atherosclerotic plaque in the left anterior descending coronary artery.  Mediastinum/Nodes: No pathologically enlarged mediastinal or hilar lymph nodes. Please note that accurate exclusion of hilar adenopathy is limited on noncontrast CT scans. Esophagus is unremarkable in appearance. No axillary lymphadenopathy.  Lungs/Pleura: High-resolution images demonstrate no significant regions of ground-glass attenuation, subpleural reticulation, parenchymal banding, traction bronchiectasis or frank honeycombing. Inspiratory and expiratory imaging demonstrates mild air trapping indicative of small airways disease. No acute consolidative airspace disease. No pleural effusions. No suspicious appearing pulmonary nodules or masses.  Upper Abdomen:  Visualized portions of the upper abdomen demonstrate diffuse low attenuation throughout the visualized portions of the hepatic parenchyma, indicative of hepatic steatosis.  Musculoskeletal: Several chronic appearing compression fractures of inferior endplate of T3, superior endplate of T6, T7 vertebral body, superior endplate of T8, superior and inferior endplates of T9 and superior and inferior endplates of H63, most severe at T9 where there is 60% loss of central vertebral body height. Multiple old healed bilateral rib fractures. There are no aggressive appearing lytic or blastic lesions noted in the visualized portions of the skeleton.  IMPRESSION: 1. No findings to suggest interstitial lung disease. 2. Mild air trapping indicative of mild small airways disease. 3. Aortic atherosclerosis, in addition to left anterior descending coronary artery disease. Please note that although the presence of coronary artery calcium documents the presence of coronary artery disease, the severity of this disease and any potential stenosis cannot be assessed on this non-gated CT examination. Assessment for potential risk factor modification, dietary therapy or pharmacologic therapy may be warranted, if clinically indicated. 4. Hepatic steatosis. 5. Additional incidental findings, as above.  Aortic Atherosclerosis (ICD10-I70.0).  Vinnie Langton M.D. 11/24/2017 15:25  June 19, 2017 DIGITAL SCREENING BILATERAL MAMMOGRAM WITH CAD COMPARISON: Previous exam(s).  ACR Breast Density Category c: The breast tissue is heterogeneously dense, which may obscure small masses.  FINDINGS: There are no findings suspicious for  malignancy. Images were processed with CAD.  IMPRESSION: No mammographic evidence of malignancy. A result letter of this screening mammogram will be mailed directly to the patient.  RECOMMENDATION: Screening mammogram in one year. (Code:[id number]-01Y)  BI-RADS  CATEGORY 1: Negative.  Nolon Nations M.D. 06/19/2017 09:05  Summary/Assessment: In the setting of long-standing anemia over a lengthy, but indeterminate period of time secondary to iron deficiency, associated with persistent thrombocytosis and leukocytosis; status post parenteral iron on October 21 and again on November 15, she presents now for the results of her lab work from today and recommendations.   Raven Turner has been aware of her anemia for many years.  She has been taking iron supplements for at least the past year consisting at present, of ferrous sulfate: 325 mg once daily.  While presenting to the emergency department at [facility] on May 12, 2018 a complete blood count showed hemoglobin 7.9 hematocrit 26.8 MCV 71.8 MCH 21.2 RDW 18.1; platelets 534,000.  On an outpatient basis on June 01, 2018 complete blood count showed hemoglobin 8.9 hematocrit 29.7 MCV 69 MCH 20.7 RDW 19.9 WBC 16.4; platelets 568,000.    A look back much further into her medical record from August 26, 2014 a complete blood count showed hemoglobin 13.0 hematocrit 38.8 MCV 78 MCH 26 RDW 16.5 WBC 14.7; platelets 444,000.  On September 08, 2014 an HIV antibody was negative. On September 15, 2014 a complete blood count showed hemoglobin 13.4 hematocrit 39.4 WBC 18.5 with 90% neutrophils 6% lymphocytes 4% monocytes; platelets 441,000. On September 13, 2015 a complete blood count showed hemoglobin 13.0 hematocrit 39.8 WBC 11.1; platelets 453,000.  More recently on October 11, 2017 a complete blood count showed hemoglobin 10.3 hematocrit 32.0 MCV 75 RDW 16.9 WBC 12.1 with 66% neutrophils 23% lymphocytes 9% monocytes 2% eosinophils 1% basophil; platelets 453,000.  Prior to today's visit her most recent complete blood count shows hemoglobin 8.9 hematocrit 29.7 MCV 69 MCH 20.7 RDW 19.9 WBC 16.4; platelets 568,000.    She was hospitalized in 2015 for bronchitis.    In 2016 she was hospitalized for cholelithiasis.  She has  chronic degenerative joint disease involving the neck, lower back, fingers, and knees.  She has compression fractures involving both the thoracic and lumbar spine. The results of her CT imaging from November 24, 2017 suggests extensive coronary artery disease/calcification.    On September 25, a battery of laboratory studies were obtained to exclude any additional contributing factors to her anemia such as an underlying hemolytic process, paraproteinemia, myeloproliferative disorder, nutritional deficit, metabolic explanation, or autoimmune phenomenon. Those results are detailed above.  At the time of her initial visit, we discussed also the importance of identifying a cause or causes of her underlying anemia, thrombocytosis, and leukocytosis. Not infrequently iron deficiency anemia is associated with a reactive thrombocytosis.  If the anemia and the iron deficiency is corrected, generally the thrombocytosis self-corrects. That is exactly what has happened.  Her first dose of parenteral iron was on July 02, 2018.  Following that treatment, her dizziness and lightheadedness have improved.  Her overall energy level remains low but stable. She continues on daily prednisone now for several years because of her bronchial asthma.  Her first infusion with ferumoxytol was on October 21.  Her second infusion was on November 15. She felt only slightly better following her second treatment.  Since her last visit, she was started on hydrocodone/APAP: 5/325: 1 tablet twice daily.  She notices only a minimal improvement.  We discussed the  possibility of having a diagnostic colonoscopy, which she has never had.  Given her complex history, a colonoscopy is warranted due to her protracted iron deficiency anemia.  Her menses continue but are relatively moderate in flow.  She had an esophagogastroduodenoscopy 2 years earlier which failed to reveal any evidence of active bleeding.    In the interim since she reports no new  problems or complaints. Her appetite and weight remain stable. Her energy level is chronically low. Her last menstruation started on December 5.  It lasted 7-8 days. Her first 5 days are heavy. She has no dizziness, lightheadedness, syncope, near syncopal episodes. She reports no unusual cough, sore throat, or orthopnea.  She has chronic but stable exertional dyspnea unchanged over her usual baseline. She denies pain or difficulty in swallowing. She has no fever, shaking chills, sweats, or flulike symptoms.  She denies heartburn or indigestion. She has no nausea, vomiting, diarrhea, or constipation.  She moves her bowels once daily. Her dyspepsia is controlled with protein pump inhibitor therapy.  She reports no melena or bright red blood per rectum. Her ankles swell slightly during the daytime and received overnight.  She complains of paresthesias involving the hands bilaterally.  There are no new focal areas of bone, joint, muscle pain.  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   Recommendation/Plan: The results of her laboratory studies were reviewed and discussed in detail.  Those results are outlined above.  Her serum ferritin is normal.  Although her hemoglobin has increased, she is still anemic. The results of her lab work from today suggest an improvement from 9.7 to 10.8 g/dL.  Her white blood  cell count has increased to 13.4.  The platelet count is decreasing appropriately after infusional iron, 400,000.  Initially her laboratory studies were consistent with iron deficiency anemia. Very importantly, there is no evidence of a myeloproliferative process such as polycythemia vera rubra or chronic myelogenous leukemia.  Because of leukocytosis with left shift on today's lab work, a urine specimen for urinalysis and urine culture and sensitivity was requested. Those results are pending. She was advised to call the nursing service for those results within 48 hours unless notified sooner.  Both vitamin A21 and folic acid were within normal limits but on the low side of normal.  Both methylmalonic acid and serum homocysteine were normal. The ANA was negative. There was no evidence of thalassemia minor or intermedia.    Given her degree of anemia, her reticulocyte count at 2% is low, and suggests an inappropriate bone marrow response to her degree of anemia.  Ironically, her serum erythropoietin is high at 59.0. Her electrolytes were within normal range.  There was no hypercalcemia. The calculated GFR was normal.  The CRP was 4.7. Although very sensitive, the CRP is nonspecific.  We discussed previously that, given her complex history, having never had a colonoscopy in the past, and being iron deficient, endoscopic evaluation is warranted.  Although she menstruates on a regular basis, and has had no changes in her flow either duration or volume, she may be  losing blood from a gastrointestinal source in addition.  She had an esophagogastroduodenoscopy 2 years earlier which failed to reveal any evidence of active bleeding. She should discuss a diagnostic colonoscopy with Dr. Margarita Rana with appropriate referral to gastroenterology, as she deems appropriate. I strongly recommend an endoscopic evaluation.  If she has not had a recent gynecologic examination or bimanual examination and/or Pap smear, it should  be done given her heavy menses when, at her age, it generally starts to taper and decrease.   Barring any unforeseen complications, her next scheduled doctor visit with laboratory studies is on December 23. Questions were answered to her satisfaction.  This note was dictated using voice activated technology/software.  Unfortunately, typographical errors are not uncommon, and transcription is subject to mistakes and regrettably misinterpretation.  If necessary, clarification of the above information can be discussed with me at any time.  FOLLOW UP: AS DIRECTED   cc:   Charlott Rakes, MD   Henreitta Leber, MD  Hematology/Oncology  [facility] [address] [place], [place] [address] Office: [phone] EKBT: [phone]

## 2018-08-21 NOTE — Telephone Encounter (Signed)
Printed avs and calender of upcoming appointment. Per 12/10 los 

## 2018-08-22 ENCOUNTER — Telehealth: Payer: Self-pay | Admitting: Hematology and Oncology

## 2018-08-22 ENCOUNTER — Other Ambulatory Visit: Payer: Self-pay | Admitting: Hematology and Oncology

## 2018-08-22 LAB — URINE CULTURE: Culture: 60000 — AB

## 2018-08-22 MED ORDER — CEPHALEXIN 500 MG PO CAPS: 500.0000 mg | ORAL_CAPSULE | Freq: Two times a day (BID) | ORAL | 0 refills | Status: AC

## 2018-08-22 NOTE — Telephone Encounter (Signed)
Raven Turner called to get the results of her urine culture. There are 60,000 colonies of strep B in her urine. A prescription for cephalexin: 500 mg twice daily was called to [facility].   She was instructed to take the cephalexin for 5 days. She verbalized understanding.

## 2018-08-24 ENCOUNTER — Other Ambulatory Visit: Payer: Self-pay | Admitting: Pulmonary Disease

## 2018-08-24 DIAGNOSIS — J4550 Severe persistent asthma, uncomplicated: Secondary | ICD-10-CM

## 2018-09-06 ENCOUNTER — Ambulatory Visit (HOSPITAL_COMMUNITY)
Admission: EM | Admit: 2018-09-06 | Discharge: 2018-09-06 | Disposition: A | Discharge status: Home / Self Care | Payer: Medicaid Other | Attending: Family Medicine | Admitting: Family Medicine

## 2018-09-06 ENCOUNTER — Encounter (HOSPITAL_COMMUNITY): Payer: Self-pay | Admitting: Emergency Medicine

## 2018-09-06 ENCOUNTER — Emergency Department (HOSPITAL_COMMUNITY)
Admission: EM | Admit: 2018-09-06 | Discharge: 2018-09-06 | Disposition: A | Discharge status: Home / Self Care | Payer: Medicaid Other | Attending: Emergency Medicine | Admitting: Emergency Medicine

## 2018-09-06 ENCOUNTER — Emergency Department (HOSPITAL_COMMUNITY): Payer: Medicaid Other

## 2018-09-06 ENCOUNTER — Other Ambulatory Visit: Payer: Self-pay

## 2018-09-06 DIAGNOSIS — K7581 Nonalcoholic steatohepatitis (NASH): Secondary | ICD-10-CM | POA: Diagnosis not present

## 2018-09-06 DIAGNOSIS — I10 Essential (primary) hypertension: Secondary | ICD-10-CM | POA: Insufficient documentation

## 2018-09-06 DIAGNOSIS — R1031 Right lower quadrant pain: Secondary | ICD-10-CM | POA: Diagnosis not present

## 2018-09-06 DIAGNOSIS — Z79899 Other long term (current) drug therapy: Secondary | ICD-10-CM | POA: Diagnosis not present

## 2018-09-06 DIAGNOSIS — E119 Type 2 diabetes mellitus without complications: Secondary | ICD-10-CM | POA: Diagnosis not present

## 2018-09-06 DIAGNOSIS — J449 Chronic obstructive pulmonary disease, unspecified: Secondary | ICD-10-CM | POA: Insufficient documentation

## 2018-09-06 DIAGNOSIS — R112 Nausea with vomiting, unspecified: Secondary | ICD-10-CM | POA: Diagnosis not present

## 2018-09-06 DIAGNOSIS — Z7982 Long term (current) use of aspirin: Secondary | ICD-10-CM | POA: Insufficient documentation

## 2018-09-06 DIAGNOSIS — R1012 Left upper quadrant pain: Secondary | ICD-10-CM | POA: Diagnosis not present

## 2018-09-06 LAB — CBC
HCT: 39 % (ref 36.0–46.0)
Hemoglobin: 12.2 g/dL (ref 12.0–15.0)
MCH: 25.3 pg — ABNORMAL LOW (ref 26.0–34.0)
MCHC: 31.3 g/dL (ref 30.0–36.0)
MCV: 80.7 fL (ref 80.0–100.0)
Platelets: 463 10*3/uL — ABNORMAL HIGH (ref 150–400)
RBC: 4.83 MIL/uL (ref 3.87–5.11)
RDW: 22.5 % — ABNORMAL HIGH (ref 11.5–15.5)
WBC: 10.8 10*3/uL — ABNORMAL HIGH (ref 4.0–10.5)
nRBC: 0 % (ref 0.0–0.2)

## 2018-09-06 LAB — URINALYSIS, ROUTINE W REFLEX MICROSCOPIC
Bilirubin Urine: NEGATIVE
Glucose, UA: NEGATIVE mg/dL
Ketones, ur: 20 mg/dL — AB
Nitrite: NEGATIVE
Protein, ur: 30 mg/dL — AB
Specific Gravity, Urine: 1.014 (ref 1.005–1.030)
pH: 8 (ref 5.0–8.0)

## 2018-09-06 LAB — COMPREHENSIVE METABOLIC PANEL
ALT: 62 U/L — ABNORMAL HIGH (ref 0–44)
AST: 53 U/L — ABNORMAL HIGH (ref 15–41)
Albumin: 3.7 g/dL (ref 3.5–5.0)
Alkaline Phosphatase: 86 U/L (ref 38–126)
Anion gap: 7 (ref 5–15)
BUN: 8 mg/dL (ref 6–20)
CO2: 30 mmol/L (ref 22–32)
Calcium: 9.4 mg/dL (ref 8.9–10.3)
Chloride: 102 mmol/L (ref 98–111)
Creatinine, Ser: 1.09 mg/dL — ABNORMAL HIGH (ref 0.44–1.00)
GFR calc Af Amer: >60 mL/min (ref >60)
GFR calc non Af Amer: >60 mL/min (ref >60)
Glucose, Bld: 130 mg/dL — ABNORMAL HIGH (ref 70–99)
Potassium: 4 mmol/L (ref 3.5–5.1)
Sodium: 139 mmol/L (ref 135–145)
Total Bilirubin: 0.6 mg/dL (ref 0.3–1.2)
Total Protein: 8 g/dL (ref 6.5–8.1)

## 2018-09-06 LAB — POCT I-STAT, CHEM 8
BUN: 10 mg/dL (ref 6–20)
Calcium, Ion: 1.17 mmol/L (ref 1.15–1.40)
Chloride: 103 mmol/L (ref 98–111)
Creatinine, Ser: 0.9 mg/dL (ref 0.44–1.00)
Glucose, Bld: 147 mg/dL — ABNORMAL HIGH (ref 70–99)
HCT: 40 % (ref 36.0–46.0)
Hemoglobin: 13.6 g/dL (ref 12.0–15.0)
Potassium: 3.9 mmol/L (ref 3.5–5.1)
Sodium: 140 mmol/L (ref 135–145)
TCO2: 29 mmol/L (ref 22–32)

## 2018-09-06 LAB — LIPASE, BLOOD: Lipase: 24 U/L (ref 11–51)

## 2018-09-06 MED ORDER — IOHEXOL 300 MG/ML  SOLN
100.0000 mL | Freq: Once | INTRAMUSCULAR | Status: AC | PRN
  Administered 2018-09-06: 100 mL via INTRAVENOUS

## 2018-09-06 MED ORDER — HYDROMORPHONE HCL 1 MG/ML IJ SOLN
1.0000 mg | Freq: Once | INTRAMUSCULAR | Status: AC
  Administered 2018-09-06: 1 mg via INTRAVENOUS
  Filled 2018-09-06: qty 1

## 2018-09-06 MED ORDER — ONDANSETRON HCL 4 MG/2ML IJ SOLN
4.0000 mg | Freq: Once | INTRAMUSCULAR | Status: AC
  Administered 2018-09-06: 4 mg via INTRAVENOUS
  Filled 2018-09-06: qty 2

## 2018-09-06 NOTE — ED Triage Notes (Signed)
Pt reports LUQabdominal pain x 2 days. Pt reports nausea, denies vomiting, diarrhea, fever, sick contact or urinary symptoms.

## 2018-09-06 NOTE — ED Triage Notes (Addendum)
Pt presents to [facility] for assessment of upper abdominal pain with nausea.  Denies vomiting, denies constipation or diarrhea.  Pt with a hx of diabetes, states she does not check her sugars at home.  Pt has tried Zofran at home without relief.

## 2018-09-06 NOTE — Discharge Instructions (Addendum)
Follow up with your doctor, return to ER for new or worsening symptoms.  °

## 2018-09-06 NOTE — Discharge Instructions (Addendum)
I believe your pain and nausea are coming from fatty liver problem.  Generally, we have people stay on clear liquids and Zofran (your current nausea medicine).  If your pain worsens or if the nausea and vomiting continue, call your gastroenterologist for further advice.

## 2018-09-06 NOTE — ED Provider Notes (Signed)
[facility]    CSN: [id number] Arrival date & time: 09/06/18  1108     History   Chief Complaint Chief Complaint  Patient presents with  . Abdominal Pain    HPI Raven Turner is a 47 y.o. female.   47 year old established obese woman. Pt presents to [facility] for assessment of upper abdominal pain with nausea.  Denies vomiting, denies constipation or diarrhea.  Pt with a hx of diabetes, states she does not check her sugars at home.  Pt has tried Zofran at home without relief.    Patient has been diagnosed with NASH.  She cannot recall her gastroenterologist name.  Note from hematologist 08/21/18: Raven Turner is a 47 year old gravida 3 para 3premenopausal,resident of [place], originally from [place], whose past medical history is significant for insulin requiring diabetes mellitus; primary hypertension for the past 20 years; dyslipidemia; coronary artery disease; hypothyroid disease for the past 18 months; bronchial asthma since childhood; nonalcoholic hepatosteatosis;vitamin D deficiency on replacement therapy; pituitary microadenoma; elevated BMI; obstructive sleep apnea;gastroesophageal reflux disease; sleep disturbance; peripheral neuropathy involving the hands and fingers;degenerative joint disease involving the neck, lower back, knees bilaterally, fingers/hands bilaterally;anxiety disorder; mild depression; osteopenia; and iron deficiency anemia.She understands and speaks a very minimal amount of [place]. There is a Optometrist by way of Skype present at this visit, Raven Turner.    Raven Turner has been aware of her anemia for many years. She has been taking iron supplementsfor at least the past year consisting at present, of ferrous sulfate: 325 mg once daily. While presenting to the emergency department at [facility] on May 12, 2018 a complete blood count showed hemoglobin 7.9 hematocrit 26.8 MCV 71.8 MCH 21.2 RDW 18.1; platelets 534,000. On an  outpatient basis on June 01, 2018 complete blood count showed hemoglobin 8.9 hematocrit 29.7 MCV 69 MCH 20.7 RDW 19.9 WBC 16.4; platelets 568,000.      Past Medical History:  Diagnosis Date  . Anemia   . Asthma   . Bilateral hand numbness 06/25/2015  . Bone pain 02/27/2015   Diffuse bone pain in legs mostly but also in arms    . Breast pain, left 03/27/2015  . Callus of heel 12/16/2014  . Compression fracture   . Compression fracture of L1 lumbar vertebra (HCC) 08/26/2014  . Compression fracture of T12 vertebra ([place]) 08/28/2014  . COPD (chronic obstructive pulmonary disease) ([place]) 2013  . Cushing syndrome ([place]) 2013   . Depression   . Diabetes ([place]) 2013   . Diabetes mellitus type 2, uncontrolled ([place]) 08/26/2014   Sees Dr. Katy Fitch.  Last visit 11/27/14.  No diabetic retinopathy    . Diabetes mellitus with neuropathy ([place])   . Diastolic dysfunction without heart failure 10/14/2014   Grade 2 diastolic dysfunction 2/2 pulm HTN   . Generalized anxiety disorder 03/27/2015  . HTN (hypertension) 2005   . Hx of cataract surgery 11/03/2014  . LUQ abdominal pain 07/17/2015  . Medication management 05/12/2015  . Morbid obesity ([place]) 09/08/2014  . 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 Non-English speaking patient 09/08/2014  . OSA (obstructive sleep apnea) 05/20/2015  . Osteopenia 11/19/2013   Dx with osteopenia in lumbar vertebra with partial compression of L1  . Pain due to onychomycosis of toenail 03/27/2015  . Secondary Cushing's syndrome/ iatrogenic   . Skin rash 07/17/2015  . Tachycardia 01/13/2015  . Upper airway cough syndrome 09/05/2014   Followed in Pulmonary clinic/ [facility]/ [place]  - Trial off advair      09/05/2014 >>>  -  trial off theoph 09/17/2014 and added flutter  - rec reduce dulera to 100 2 bid 07/13/2015     . Vision changes 06/25/2015  . Vitamin D deficiency 08/28/2014    Patient Active Problem List   Diagnosis Date Noted  . Iron deficiency anemia 06/25/2018  . Anemia  05/09/2018  . Pituitary microadenoma ([place]) 03/13/2017  . Elevated prolactin level ([place]) 01/19/2017  . Microalbuminuria due to type 2 diabetes mellitus ([place]) 12/19/2016  . Elevated TSH 12/19/2016  . Menstrual changes 12/19/2016  . Flaking of scalp 07/18/2016  . Acral type peeling skin syndrome 07/18/2016  . Non-traumatic compression fracture of vertebral column ([place]) 01/20/2016  . Bilateral carpal tunnel syndrome 12/21/2015  . Chronic back pain 11/05/2015  . NASH (nonalcoholic steatohepatitis) 09/25/2015  . Bilateral hand numbness 06/25/2015  . OSA (obstructive sleep apnea) 05/20/2015  . Generalized anxiety disorder 03/27/2015  . Breast pain, left 03/27/2015  . Callus of heel 12/16/2014  . 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 Hx of cataract surgery 11/03/2014  . Diastolic dysfunction without heart failure 10/14/2014  . Morbid obesity ([place]) 09/08/2014  . Non-English speaking patient 09/08/2014  . Upper airway cough syndrome 09/05/2014  . Asthma ? severity vs VCD/ pseudo asthma 09/05/2014  . Vitamin D deficiency 08/28/2014  . Compression fracture of T12 vertebra ([place]) 08/28/2014  . Diabetes mellitus type 2, uncontrolled ([place]) 08/26/2014  . Compression fracture of L1 lumbar vertebra (HCC) 08/26/2014  . Essential (primary) hypertension   . Secondary Cushing's syndrome/ iatrogenic   . 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 CATARACT EXTRACTION Bilateral 2014  . HAND SURGERY Right 12/18/2015  . VAGINAL HYSTERECTOMY      OB History   No obstetric history on file.      Home Medications    Prior to Admission medications   Medication Sig Start Date End Date Taking? Authorizing Provider  albuterol (PROVENTIL HFA;VENTOLIN HFA) 108 (90 Base) MCG/ACT inhaler Inhale 2 puffs into the lungs every 4 (four) hours as needed for wheezing or shortness of breath (((PLAN B))). Patient taking differently: Inhale 2 puffs into the lungs every  4 (four) hours as needed for wheezing or shortness of breath.  10/31/17   Ladell Pier, MD  albuterol (PROVENTIL) (2.5 MG/3ML) 0.083% nebulizer solution USE 1 VIAL VIA NEBULIZER EVERY 4 HOURS AS NEEDED FOR WHEEZING OR SHORTNESS OF BREATH Patient taking differently: Take 2.5 mg by nebulization every 4 (four) hours as needed for wheezing or shortness of breath.  10/31/17   Ladell Pier, MD  aspirin EC 81 MG tablet Take 1 tablet (81 mg total) by mouth daily. 10/31/17   Ladell Pier, MD  atorvastatin (LIPITOR) 40 MG tablet Take 1 tablet (40 mg total) by mouth daily. 12/07/17 05/12/18  Fay Records, MD  Blood Glucose Monitoring Suppl (ACCU-CHEK AVIVA PLUS) W/DEVICE KIT Used as directed 11/05/14   Boykin Nearing, MD  buprenorphine (BUTRANS - DOSED MCG/HR) 5 MCG/HR PTWK patch Place 5 mcg onto the skin once a week.    [provider]  cabergoline (DOSTINEX) 0.5 MG tablet Take 0.5 tablets (0.25 mg total) by mouth 2 (two) times a week. 02/05/18   Ladell Pier, MD  cholecalciferol (VITAMIN D) 1000 UNITS tablet Take 1,000 Units by mouth every morning.    [provider]  cloNIDine (CATAPRES) 0.2 MG tablet TAKE 1 TABLET(0.2 MG) BY MOUTH THREE TIMES DAILY 05/21/18   Charlott Rakes, MD  DULERA 200-5 MCG/ACT AERO  INHALE 2 PUFFS INTO THE LUNGS TWICE DAILY 06/29/18   Juanito Doom, MD  DUPIXENT 300 MG/2ML SOSY Inject 1 pen/syringe subcutaneously every other week STARTING ON DAY 15 08/27/18   Juanito Doom, MD  furosemide (LASIX) 20 MG tablet Take 1 tablet (20 mg total) by mouth every morning. 10/31/17   Ladell Pier, MD  gabapentin (NEURONTIN) 400 MG capsule Take 2 capsules (800 mg total) by mouth 3 (three) times daily. 04/20/18   Ladell Pier, MD  gentamicin cream (GARAMYCIN) 0.1 % Apply 1 application topically 2 (two) times daily. 06/11/18   Edrick Kins, DPM  glucose blood (RELION GLUCOSE TEST STRIPS) test strip 1 each by Other route 4 (four) times daily. ICD  code E11.65 10/31/17   Ladell Pier, MD  HUMULIN R U-500 KWIKPEN 500 UNIT/ML kwikpen Inject 120-180 Units into the skin 3 (three) times daily. 180 U before breakfast, 160 U before lunch, 140 U before dinner 02/20/17   Funches, Josalyn, MD  hydrOXYzine (ATARAX/VISTARIL) 25 MG tablet TAKE 1 TABLET BY MOUTH AT BEDTIME AS NEEDED FOR ANXIETY. Patient taking differently: Take 25 mg by mouth at bedtime as needed for anxiety.  02/16/18   Ladell Pier, MD  Insulin Pen Needle (B-D ULTRAFINE III SHORT PEN) 31G X 8 MM MISC 1 application by Does not apply route daily. 03/27/15   Funches, Adriana Mccallum, MD  Insulin Syringe-Needle U-100 (BD INSULIN SYRINGE ULTRAFINE) 31G X 15/64" 1 ML MISC 1 each by Does not apply route 3 (three) times daily. 03/27/15   Boykin Nearing, MD  Lancet Devices [facility]) lancets Use as instructed 04/03/15   Boykin Nearing, MD  levothyroxine (SYNTHROID, LEVOTHROID) 25 MCG tablet Take 25 mcg by mouth daily. 01/31/17   [provider]  losartan (COZAAR) 100 MG tablet Take 1 tablet (100 mg total) by mouth every morning. 02/02/18   Ladell Pier, MD  metFORMIN (GLUCOPHAGE) 1000 MG tablet TAKE 1 TABLET BY MOUTH TWICE DAILY WITH A MEAL Patient taking differently: Take 1,000 mg by mouth 2 (two) times daily with a meal.  10/31/17   Ladell Pier, MD  methocarbamol (ROBAXIN) 750 MG tablet Take 1 tablet (750 mg total) by mouth 3 (three) times daily. MUST MAKE APPT FOR FURTHER REFILLS 08/01/18   Charlott Rakes, MD  Misc. Devices (CANE) MISC 1 each by Does not apply route daily. 07/18/16   Funches, Adriana Mccallum, MD  montelukast (SINGULAIR) 10 MG tablet Take 1 tablet (10 mg total) by mouth at bedtime. 05/09/18   Charlott Rakes, MD  omeprazole (PRILOSEC) 20 MG capsule TAKE ONE CAPSULE BY MOUTH EVERY DAY BEFORE BREAKFAST Patient taking differently: Take 20 mg by mouth daily. BEFORE BREAKFAST 05/09/18   Charlott Rakes, MD  [place] Calcium 500 MG TABS Take 1 tablet (1,250 mg  total) by mouth daily. 07/18/16   Funches, Adriana Mccallum, MD  PARoxetine (PAXIL) 20 MG tablet Take 1 tablet (20 mg total) by mouth daily. 05/09/18   Charlott Rakes, MD  predniSONE (DELTASONE) 1 MG tablet Take 3 tablets (3 mg total) by mouth daily. 07/13/18   Juanito Doom, MD  ranitidine (ZANTAC) 300 MG tablet Take 1 tablet (300 mg total) by mouth at bedtime. 02/16/18   Ladell Pier, MD  ReliOn Ultra Thin Lancets MISC 1 each by Does not apply route 4 (four) times daily. ICD code E11.65 03/27/15   Boykin Nearing, MD  Respiratory Therapy Supplies (FLUTTER) DEVI Use as directed 09/16/14   Tanda Rockers, MD  Spacer/Aero-Holding Chambers (AEROCHAMBER MV) inhaler Use as instructed 07/05/16   Juanito Doom, MD  spironolactone (ALDACTONE) 25 MG tablet Take 0.5 tablets (12.5 mg total) by mouth daily. 12/04/17   Fay Records, MD  ULTICARE MICRO PEN NEEDLES 32G X 4 MM MISC USE AS DIRECTED 09/08/15   Boykin Nearing, MD  verapamil (CALAN-SR) 180 MG CR tablet Take 2 tablets (360 mg total) by mouth at bedtime. 02/02/18   Ladell Pier, MD    Family History Family History  Problem Relation Age of Onset  . Diabetes Mother   . Stroke Father   . Asthma Father   . Hypertension Sister   . Hypertension Brother     Social History Social History   Tobacco Use  . Smoking status: Never Smoker  . Smokeless tobacco: Never Used  Substance Use Topics  . Alcohol use: No    Alcohol/week: 0.0 standard drinks  . Drug use: No     Allergies   Shellfish allergy; Septra [sulfamethoxazole-trimethoprim]; and Hydrocortisone   Review of Systems Review of Systems   Physical Exam Triage Vital Signs ED Triage Vitals [09/06/18 1237]  Enc Vitals Group     BP (!) 157/78     Pulse Rate 78     Resp 16     Temp 98.7 F (37.1 C)     Temp Source Oral     SpO2 99 %     Weight      Height      Head Circumference      Peak Flow      Pain Score 7     Pain Loc      Pain Edu?      Excl. in [place]?    No  data found.  Updated Vital Signs BP (!) 157/78 (BP Location: Left Arm)   Pulse 78   Temp 98.7 F (37.1 C) (Oral)   Resp 16   SpO2 99%    Physical Exam Vitals signs and nursing note reviewed.  Constitutional:      Appearance: She is well-developed. She is obese. She is ill-appearing.  HENT:     Head: Normocephalic.     Mouth/Throat:     Mouth: Mucous membranes are moist.  Eyes:     Extraocular Movements: Extraocular movements intact.  Cardiovascular:     Rate and Rhythm: Normal rate.     Heart sounds: Normal heart sounds.  Pulmonary:     Effort: Pulmonary effort is normal.     Breath sounds: Normal breath sounds.  Abdominal:     General: Abdomen is protuberant. Bowel sounds are decreased.     Tenderness: There is abdominal tenderness in the left lower quadrant. There is guarding.  Skin:    General: Skin is warm and dry.  Neurological:     General: No focal deficit present.     Mental Status: She is alert.  Psychiatric:        Mood and Affect: Mood normal.        Behavior: Behavior normal.      UC Treatments / Results  Labs (all labs ordered are listed, but only abnormal results are displayed) Labs Reviewed  POCT I-STAT, CHEM 8 - Abnormal; Notable for the following components:      Result Value   Glucose, Bld 147 (*)    All other components within normal limits  I-STAT CHEM 8, ED    EKG None  Radiology No results found.  Procedures Procedures (including critical care  time)  Medications Ordered in UC Medications - No data to display  Initial Impression / Assessment and Plan / UC Course  I have reviewed the triage vital signs and the nursing notes.  Pertinent labs & imaging results that were available during my care of the patient were reviewed by me and considered in my medical decision making (see chart for details).    Final Clinical Impressions(s) / UC Diagnoses   Final diagnoses:  NASH (nonalcoholic steatohepatitis)  Right lower quadrant  abdominal pain  Intractable vomiting with nausea, unspecified vomiting type     Discharge Instructions     I believe your pain and nausea are coming from fatty liver problem.  Generally, we have people stay on clear liquids and Zofran (your current nausea medicine).  If your pain worsens or if the nausea and vomiting continue, call your gastroenterologist for further advice.    ED Prescriptions    None     Controlled Substance Prescriptions [place] Controlled Substance Registry consulted? Not Applicable   Robyn Haber, MD 09/06/18 1331

## 2018-09-06 NOTE — ED Notes (Signed)
ED Provider at bedside. 

## 2018-09-06 NOTE — ED Provider Notes (Signed)
[facility] Provider Note   CSN: [id number] Arrival date & time: 09/06/18  1727     History   Chief Complaint Chief Complaint  Patient presents with  . Abdominal Pain    HPI Raven Turner is a 47 y.o. female.  47 year old female presents with complaint of abdominal pain x2 days with nausea and vomiting. Pain is located primarily in the LUQ, radiates to diffuse abdominal area, is constant.  Patient states that she is unable to eat anything secondary to pain and nausea.  Nonbloody emesis, reports normal nonbloody stools.  Previous abdominal surgeries include tubal ligation.  Patient was seen at urgent care 2 days ago when her pain started, advised clear liquid diet and Zofran, pain thought to be related to her NASH. Patient takes 1m of ASA daily, no NSAIDs.     Past Medical History:  Diagnosis Date  . Anemia   . Asthma   . Bilateral hand numbness 06/25/2015  . Bone pain 02/27/2015   Diffuse bone pain in legs mostly but also in arms    . Breast pain, left 03/27/2015  . Callus of heel 12/16/2014  . Compression fracture   . Compression fracture of L1 lumbar vertebra (HCC) 08/26/2014  . Compression fracture of T12 vertebra ([facility] 08/28/2014  . COPD (chronic obstructive pulmonary disease) ([place] 2013  . Cushing syndrome ([place] 2013   . Depression   . Diabetes ([facility] 2013   . Diabetes mellitus type 2, uncontrolled (HP[place] [facility] 08/26/2014   Sees Dr. GKaty Fitch  Last visit 11/27/14.  No diabetic retinopathy    . Diabetes mellitus with neuropathy ([place]   . Diastolic dysfunction without heart failure 10/14/2014   Grade 2 diastolic dysfunction 2/2 pulm HTN   . Generalized anxiety disorder 03/27/2015  . HTN (hypertension) 2005   . Hx of cataract surgery 11/03/2014  . LUQ abdominal pain 07/17/2015  . Medication management 05/12/2015  . Morbid obesity ([place] 09/08/2014  . 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 Non-English speaking patient 09/08/2014  . OSA  (obstructive sleep apnea) 05/20/2015  . Osteopenia 11/19/2013   Dx with osteopenia in lumbar vertebra with partial compression of L1  . Pain due to onychomycosis of toenail 03/27/2015  . Secondary Cushing's syndrome/ iatrogenic   . Skin rash 07/17/2015  . Tachycardia 01/13/2015  . Upper airway cough syndrome 09/05/2014   Followed in Pulmonary clinic/ [facility]/ [place]  - Trial off advair      09/05/2014 >>>  - trial off theoph 09/17/2014 and added flutter  - rec reduce dulera to 100 2 bid 07/13/2015     . Vision changes 06/25/2015  . Vitamin D deficiency 08/28/2014    Patient Active Problem List   Diagnosis Date Noted  . Iron deficiency anemia 06/25/2018  . Anemia 05/09/2018  . Pituitary microadenoma (HT[place] 03/13/2017  . Elevated prolactin level (H[place] 01/19/2017  . Microalbuminuria due to type 2 diabetes mellitus (H[place] 12/19/2016  . Elevated TSH 12/19/2016  . Menstrual changes 12/19/2016  . Flaking of scalp 07/18/2016  . Acral type peeling skin syndrome 07/18/2016  . Non-traumatic compression fracture of vertebral column ([place] 01/20/2016  . Bilateral carpal tunnel syndrome 12/21/2015  . Chronic back pain 11/05/2015  . NASH (nonalcoholic steatohepatitis) 09/25/2015  . Bilateral hand numbness 06/25/2015  . OSA (obstructive sleep apnea) 05/20/2015  . Generalized anxiety disorder 03/27/2015  . Breast pain, left 03/27/2015  . Callus of heel 12/16/2014  . 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 Hx of cataract surgery 11/03/2014  .  Diastolic dysfunction without heart failure 10/14/2014  . Morbid obesity ([place]) 09/08/2014  . Non-English speaking patient 09/08/2014  . Upper airway cough syndrome 09/05/2014  . Asthma ? severity vs VCD/ pseudo asthma 09/05/2014  . Vitamin D deficiency 08/28/2014  . Compression fracture of T12 vertebra ([place]) 08/28/2014  . Diabetes mellitus type 2, uncontrolled ([place]) 08/26/2014  . Compression fracture of L1 lumbar vertebra (HCC) 08/26/2014  . Essential (primary)  hypertension   . Secondary Cushing's syndrome/ iatrogenic   . 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 CATARACT EXTRACTION Bilateral 2014  . HAND SURGERY Right 12/18/2015  . VAGINAL HYSTERECTOMY       OB History   No obstetric history on file.      Home Medications    Prior to Admission medications   Medication Sig Start Date End Date Taking? Authorizing Provider  albuterol (PROVENTIL HFA;VENTOLIN HFA) 108 (90 Base) MCG/ACT inhaler Inhale 2 puffs into the lungs every 4 (four) hours as needed for wheezing or shortness of breath (((PLAN B))). Patient taking differently: Inhale 2 puffs into the lungs every 4 (four) hours as needed for wheezing or shortness of breath.  10/31/17   Ladell Pier, MD  albuterol (PROVENTIL) (2.5 MG/3ML) 0.083% nebulizer solution USE 1 VIAL VIA NEBULIZER EVERY 4 HOURS AS NEEDED FOR WHEEZING OR SHORTNESS OF BREATH Patient taking differently: Take 2.5 mg by nebulization every 4 (four) hours as needed for wheezing or shortness of breath.  10/31/17   Ladell Pier, MD  aspirin EC 81 MG tablet Take 1 tablet (81 mg total) by mouth daily. 10/31/17   Ladell Pier, MD  atorvastatin (LIPITOR) 40 MG tablet Take 1 tablet (40 mg total) by mouth daily. 12/07/17 05/12/18  Fay Records, MD  Blood Glucose Monitoring Suppl (ACCU-CHEK AVIVA PLUS) W/DEVICE KIT Used as directed 11/05/14   Boykin Nearing, MD  buprenorphine (BUTRANS - DOSED MCG/HR) 5 MCG/HR PTWK patch Place 5 mcg onto the skin once a week.    [provider]  cabergoline (DOSTINEX) 0.5 MG tablet Take 0.5 tablets (0.25 mg total) by mouth 2 (two) times a week. 02/05/18   Ladell Pier, MD  cholecalciferol (VITAMIN D) 1000 UNITS tablet Take 1,000 Units by mouth every morning.    [provider]  cloNIDine (CATAPRES) 0.2 MG tablet TAKE 1 TABLET(0.2 MG) BY MOUTH THREE TIMES DAILY 05/21/18   Charlott Rakes, MD  DULERA 200-5 MCG/ACT  AERO INHALE 2 PUFFS INTO THE LUNGS TWICE DAILY 06/29/18   Juanito Doom, MD  DUPIXENT 300 MG/2ML SOSY Inject 1 pen/syringe subcutaneously every other week STARTING ON DAY 15 08/27/18   Juanito Doom, MD  furosemide (LASIX) 20 MG tablet Take 1 tablet (20 mg total) by mouth every morning. 10/31/17   Ladell Pier, MD  gabapentin (NEURONTIN) 400 MG capsule Take 2 capsules (800 mg total) by mouth 3 (three) times daily. 04/20/18   Ladell Pier, MD  gentamicin cream (GARAMYCIN) 0.1 % Apply 1 application topically 2 (two) times daily. 06/11/18   Edrick Kins, DPM  glucose blood (RELION GLUCOSE TEST STRIPS) test strip 1 each by Other route 4 (four) times daily. ICD code E11.65 10/31/17   Ladell Pier, MD  HUMULIN R U-500 KWIKPEN 500 UNIT/ML kwikpen Inject 120-180 Units into the skin 3 (three) times daily. 180 U before breakfast, 160 U before lunch, 140 U before dinner 02/20/17   Boykin Nearing, MD  hydrOXYzine (ATARAX/VISTARIL)  25 MG tablet TAKE 1 TABLET BY MOUTH AT BEDTIME AS NEEDED FOR ANXIETY. Patient taking differently: Take 25 mg by mouth at bedtime as needed for anxiety.  02/16/18   Ladell Pier, MD  Insulin Pen Needle (B-D ULTRAFINE III SHORT PEN) 31G X 8 MM MISC 1 application by Does not apply route daily. 03/27/15   Funches, Adriana Mccallum, MD  Insulin Syringe-Needle U-100 (BD INSULIN SYRINGE ULTRAFINE) 31G X 15/64" 1 ML MISC 1 each by Does not apply route 3 (three) times daily. 03/27/15   Boykin Nearing, MD  Lancet Devices [facility]) lancets Use as instructed 04/03/15   Boykin Nearing, MD  levothyroxine (SYNTHROID, LEVOTHROID) 25 MCG tablet Take 25 mcg by mouth daily. 01/31/17   [provider]  losartan (COZAAR) 100 MG tablet Take 1 tablet (100 mg total) by mouth every morning. 02/02/18   Ladell Pier, MD  metFORMIN (GLUCOPHAGE) 1000 MG tablet TAKE 1 TABLET BY MOUTH TWICE DAILY WITH A MEAL Patient taking differently: Take 1,000 mg by mouth 2 (two) times  daily with a meal.  10/31/17   Ladell Pier, MD  methocarbamol (ROBAXIN) 750 MG tablet Take 1 tablet (750 mg total) by mouth 3 (three) times daily. MUST MAKE APPT FOR FURTHER REFILLS 08/01/18   Charlott Rakes, MD  Misc. Devices (CANE) MISC 1 each by Does not apply route daily. 07/18/16   Funches, Adriana Mccallum, MD  montelukast (SINGULAIR) 10 MG tablet Take 1 tablet (10 mg total) by mouth at bedtime. 05/09/18   Charlott Rakes, MD  omeprazole (PRILOSEC) 20 MG capsule TAKE ONE CAPSULE BY MOUTH EVERY DAY BEFORE BREAKFAST Patient taking differently: Take 20 mg by mouth daily. BEFORE BREAKFAST 05/09/18   Charlott Rakes, MD  [place] Calcium 500 MG TABS Take 1 tablet (1,250 mg total) by mouth daily. 07/18/16   Funches, Adriana Mccallum, MD  PARoxetine (PAXIL) 20 MG tablet Take 1 tablet (20 mg total) by mouth daily. 05/09/18   Charlott Rakes, MD  predniSONE (DELTASONE) 1 MG tablet Take 3 tablets (3 mg total) by mouth daily. 07/13/18   Juanito Doom, MD  ranitidine (ZANTAC) 300 MG tablet Take 1 tablet (300 mg total) by mouth at bedtime. 02/16/18   Ladell Pier, MD  ReliOn Ultra Thin Lancets MISC 1 each by Does not apply route 4 (four) times daily. ICD code E11.65 03/27/15   Boykin Nearing, MD  Respiratory Therapy Supplies (FLUTTER) DEVI Use as directed 09/16/14   Tanda Rockers, MD  Spacer/Aero-Holding Chambers (AEROCHAMBER MV) inhaler Use as instructed 07/05/16   Juanito Doom, MD  spironolactone (ALDACTONE) 25 MG tablet Take 0.5 tablets (12.5 mg total) by mouth daily. 12/04/17   Fay Records, MD  ULTICARE MICRO PEN NEEDLES 32G X 4 MM MISC USE AS DIRECTED 09/08/15   Boykin Nearing, MD  verapamil (CALAN-SR) 180 MG CR tablet Take 2 tablets (360 mg total) by mouth at bedtime. 02/02/18   Ladell Pier, MD    Family History Family History  Problem Relation Age of Onset  . Diabetes Mother   . Stroke Father   . Asthma Father   . Hypertension Sister   . Hypertension Brother     Social  History Social History   Tobacco Use  . Smoking status: Never Smoker  . Smokeless tobacco: Never Used  Substance Use Topics  . Alcohol use: No    Alcohol/week: 0.0 standard drinks  . Drug use: No     Allergies   Shellfish allergy; Septra [sulfamethoxazole-trimethoprim];  and Hydrocortisone   Review of Systems Review of Systems  Constitutional: Negative for appetite change, chills and fever.  Respiratory: Negative for shortness of breath.   Cardiovascular: Negative for chest pain.  Gastrointestinal: Positive for abdominal pain, nausea and vomiting. Negative for blood in stool, constipation and diarrhea.  Genitourinary: Negative for difficulty urinating, dysuria, frequency and urgency.  Musculoskeletal: Negative for back pain.  Skin: Negative for rash and wound.  Allergic/Immunologic: Positive for immunocompromised state.  Neurological: Negative for dizziness and weakness.  Hematological: Does not bruise/bleed easily.  Psychiatric/Behavioral: Negative for confusion.  All other systems reviewed and are negative.    Physical Exam Updated Vital Signs BP (!) 145/58 (BP Location: Right Arm)   Pulse 79   Temp 98.5 F (36.9 C) (Oral)   Resp 16   SpO2 99%   Physical Exam Vitals signs and nursing note reviewed.  Constitutional:      General: She is not in acute distress.    Appearance: She is well-developed. She is not diaphoretic.  HENT:     Head: Normocephalic and atraumatic.  Cardiovascular:     Rate and Rhythm: Normal rate and regular rhythm.     Heart sounds: Normal heart sounds. No murmur.  Pulmonary:     Effort: Pulmonary effort is normal.     Breath sounds: Normal breath sounds.  Abdominal:     Palpations: Abdomen is soft.     Tenderness: There is generalized abdominal tenderness. There is no right CVA tenderness or left CVA tenderness.  Skin:    General: Skin is warm and dry.     Findings: No rash.  Neurological:     Mental Status: She is alert and oriented  to person, place, and time.  Psychiatric:        Behavior: Behavior normal.      ED Treatments / Results  Labs (all labs ordered are listed, but only abnormal results are displayed) Labs Reviewed  COMPREHENSIVE METABOLIC PANEL - Abnormal; Notable for the following components:      Result Value   Glucose, Bld 130 (*)    Creatinine, Ser 1.09 (*)    AST 53 (*)    ALT 62 (*)    All other components within normal limits  CBC - Abnormal; Notable for the following components:   WBC 10.8 (*)    MCH 25.3 (*)    RDW 22.5 (*)    Platelets 463 (*)    All other components within normal limits  URINALYSIS, ROUTINE W REFLEX MICROSCOPIC - Abnormal; Notable for the following components:   APPearance HAZY (*)    Hgb urine dipstick MODERATE (*)    Ketones, ur 20 (*)    Protein, ur 30 (*)    Leukocytes, UA SMALL (*)    Bacteria, UA RARE (*)    All other components within normal limits  LIPASE, BLOOD    EKG None  Radiology Ct Abdomen Pelvis W Contrast  Result Date: 09/06/2018 CLINICAL DATA:  Upper abdominal pain and nausea EXAM: CT ABDOMEN AND PELVIS WITH CONTRAST TECHNIQUE: Multidetector CT imaging of the abdomen and pelvis was performed using the standard protocol following bolus administration of intravenous contrast. CONTRAST:  160m OMNIPAQUE IOHEXOL 300 MG/ML  SOLN COMPARISON:  January 03, 2016 FINDINGS: Lower chest: There is bibasilar atelectasis. Hepatobiliary: There is hepatic steatosis. Liver measures 20.1 cm in length. No focal liver lesions are appreciable. Gallbladder wall is not appreciably thickened. There is no biliary duct dilatation. Pancreas: No pancreatic mass or inflammatory  focus. 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 No other splenic lesions are appreciable. Adrenals/Urinary Tract: Adrenals bilaterally appear unremarkable. Kidneys bilaterally show no evident mass or hydronephrosis on either side. There is a 1 mm  calculus in the lower pole left kidney. There is a 1 mm calculus in the mid right kidney with a 2 mm calculus in the lower pole right kidney. There is no ureteral calculus on either side. Urinary bladder is midline with wall thickness within normal limits. Stomach/Bowel: There is no appreciable bowel wall or mesenteric thickening. There is no appreciable bowel obstruction. There is no free air or portal venous air. Vascular/Lymphatic: Aorta is tortuous without aneurysm. No focal vascular lesions are evident. No adenopathy is appreciable in the abdomen or pelvis. Reproductive: Uterus is anteverted. There is no appreciable pelvic mass. Other: Appendix appears normal. There is no abscess or ascites in the abdomen or pelvis. There is thinning of the rectus muscle in the midline. Musculoskeletal: There is anterior wedging of multiple lower thoracic and lumbar vertebral bodies, essentially stable. No blastic or lytic bone lesions are evident. No intramuscular lesions are appreciable. IMPRESSION: 1. Small nonobstructing calculi in each kidney. No hydronephrosis or ureteral calculus on either side. 2. No evident bowel obstruction. No abscess in the abdomen or pelvis. Appendix appears normal. 3.  Prominent liver with hepatic steatosis. 4. Persistent wedging of multiple thoracic and lumbar vertebral bodies. Electronically Signed   By: Lowella Grip III M.D.   On: 09/06/2018 21:29    Procedures Procedures (including critical care time)  Medications Ordered in ED Medications  HYDROmorphone (DILAUDID) injection 1 mg (1 mg Intravenous Given 09/06/18 2034)  ondansetron (ZOFRAN) injection 4 mg (4 mg Intravenous Given 09/06/18 2033)  iohexol (OMNIPAQUE) 300 MG/ML solution 100 mL (100 mLs Intravenous Contrast Given 09/06/18 2058)     Initial Impression / Assessment and Plan / ED Course  I have reviewed the triage vital signs and the nursing notes.  Pertinent labs & imaging results that were available during my  care of the patient were reviewed by me and considered in my medical decision making (see chart for details).  Clinical Course as of Sep 06 2202  Thu Sep 06, 3629  [id number] 47 year old female presents with complaint of left upper quadrant abdominal pain radiates to diffuse abdomen onset 2 days ago.  Patient has a history of Karlene Lineman, seen by urgent care and advised to do clear liquids and take her Zofran.  Patient has tried this at home however pain has gotten progressively worse.  Patient denies any blood in her emesis, denies changes in stools, denies fevers or chills, denies shortness of breath or chest pain. On exam, diffuse abdominal tenderness. DDX peptic ulcer, gastritis, bowel obstruction. CT abdomen/pelvis without acute findings, CBC and CMP without significant findings, lipase WNL, UA with modeate hgb, ketones, protein, small leukocytes (contaminated sample without urinary symptoms). Patient is feeling better, offered GI cocktail, patient declines (does not like the taste of liquid medicines) and will take an antacid at home. Patient to follow up with her PCP office tomorrow regarding her referral to GI, will return to ER for new or worsening symptoms.    [LM]    Clinical Course User Index [LM] Tacy Learn, PA-C   Final Clinical Impressions(s) / ED Diagnoses   Final diagnoses:  Left upper quadrant pain    ED Discharge Orders    None       Percell Miller,  Hewitt Shorts, PA-C 09/06/18 2204    Julianne Rice, MD 09/10/18 1755

## 2018-09-10 ENCOUNTER — Other Ambulatory Visit: Payer: Self-pay | Admitting: Hematology

## 2018-09-10 DIAGNOSIS — D509 Iron deficiency anemia, unspecified: Secondary | ICD-10-CM

## 2018-09-10 NOTE — Progress Notes (Signed)
[place] OFFICE PROGRESS NOTE  Patient Care Team: Charlott Rakes, MD as PCP - General (Family Medicine)  HEME/ONC OVERVIEW: 1. Iron deficiency anemia secondary to menorrhagia   TREATMENT REGIMEN:  PRN IV iron, last in 06/2018   ASSESSMENT & PLAN:   Iron deficiency anemia -Secondary to blood loss from menorrhagia -Hgb 12.1 today; iron profile adequate  -No indication for IV iron today -I counseled the patient on importance of age-appropriate cancer screening, including colonoscopy -Furthermore, given the new onset menorrhagia, I counseled the patient on importance of following up with her PCP and referral to gynecology as needed for further evaluation  Menorrhagia -Patient reports menorrhagia with heavier blood passage and longer duration for the past 4 months -As discussed above, I emphasized the importance of follow-up with her PCP and referral to gynecology for further evaluation  Intermittent dizziness -Patient reports that her dizziness has improved significantly since IV iron infusion, but she still has occasional dizziness -She denies any syncope, chest pain, dyspnea, or diaphoresis -As her hemoglobin is now normal, I do not think the dizziness is related to anemia, and I encouraged patient to follow-up with her PCP for further work-up  Orders Placed This Encounter  Procedures  . CBC with Differential (Cancer Center Only)    Standing Status:   Future    Standing Expiration Date:   10/16/2019  . Ferritin    Standing Status:   Future    Standing Expiration Date:   10/16/2019  . Iron and TIBC    Standing Status:   Future    Standing Expiration Date:   10/16/2019   All questions were answered. The patient knows to call the clinic with any problems, questions or concerns. No barriers to learning was detected.  A total of more than 15 minutes were spent face-to-face with the patient during this encounter and over half of that time was spent on counseling and  coordination of care as outlined above.   Return in 6 months for labs and clinic follow-up.  Tish Men, MD 09/11/2018 12:08 PM  CHIEF COMPLAINT: "I am doing well"  INTERVAL HISTORY: [name] presents to clinic for follow-up for iron deficiency anemia.  The patient does not speak English, and the history was obtained via Spanish interpreter via [employer].  She presented to the ER over the Christmas holiday for evaluation of abdominal pain.  CT abdomen/pelvis did not reveal any acute intra-abdominal pathologies, and patient was discharged from the ER with supportive measures.  Patient reports that she still has occasional transient dizziness, but it has improved significantly since IV iron infusion.  She reports that over the past 74-month she has had new menorrhagia, with heavier blood flow and clots for the first 3 to 4 days, and the cycle lasts 7 to 8 days.  She has not been evaluated by her PCP or gynecologist.  She denies any hematemesis, hemoptysis, hematochezia, melena, or hematuria.  REVIEW OF SYSTEMS:   Constitutional: ( - ) fevers, ( - )  chills , ( - ) night sweats Eyes: ( - ) blurriness of vision, ( - ) double vision, ( - ) watery eyes Ears, nose, mouth, throat, and face: ( - ) mucositis, ( - ) sore throat Respiratory: ( - ) cough, ( - ) dyspnea, ( - ) wheezes Cardiovascular: ( - ) palpitation, ( - ) chest discomfort, ( - ) lower extremity swelling Gastrointestinal:  ( - ) nausea, ( - ) heartburn, ( - ) change in  bowel habits Skin: ( - ) abnormal skin rashes Lymphatics: ( - ) new lymphadenopathy, ( - ) easy bruising Neurological: ( - ) numbness, ( - ) tingling, ( - ) new weaknesses Behavioral/Psych: ( - ) mood change, ( - ) new changes  All other systems were reviewed with the patient and are negative.  I have reviewed the past medical history, past surgical history, social history and family history with the patient and they are unchanged from previous note.  ALLERGIES:  is  allergic to shellfish allergy; septra [sulfamethoxazole-trimethoprim]; and hydrocortisone.  MEDICATIONS:  Current Outpatient Medications  Medication Sig Dispense Refill  . albuterol (PROVENTIL HFA;VENTOLIN HFA) 108 (90 Base) MCG/ACT inhaler Inhale 2 puffs into the lungs every 4 (four) hours as needed for wheezing or shortness of breath (((PLAN B))). (Patient taking differently: Inhale 2 puffs into the lungs every 4 (four) hours as needed for wheezing or shortness of breath. ) 18 g 6  . albuterol (PROVENTIL) (2.5 MG/3ML) 0.083% nebulizer solution USE 1 VIAL VIA NEBULIZER EVERY 4 HOURS AS NEEDED FOR WHEEZING OR SHORTNESS OF BREATH (Patient taking differently: Take 2.5 mg by nebulization every 4 (four) hours as needed for wheezing or shortness of breath. ) 90 mL 0  . aspirin EC 81 MG tablet Take 1 tablet (81 mg total) by mouth daily. 90 tablet 3  . Blood Glucose Monitoring Suppl (ACCU-CHEK AVIVA PLUS) W/DEVICE KIT Used as directed 1 kit 0  . buprenorphine (BUTRANS - DOSED MCG/HR) 5 MCG/HR PTWK patch Place 5 mcg onto the skin once a week.    . cabergoline (DOSTINEX) 0.5 MG tablet Take 0.5 tablets (0.25 mg total) by mouth 2 (two) times a week. 10 tablet 3  . cholecalciferol (VITAMIN D) 1000 UNITS tablet Take 1,000 Units by mouth every morning.    . cloNIDine (CATAPRES) 0.2 MG tablet TAKE 1 TABLET(0.2 MG) BY MOUTH THREE TIMES DAILY 90 tablet 2  . DULERA 200-5 MCG/ACT AERO INHALE 2 PUFFS INTO THE LUNGS TWICE DAILY 13 g 3  . DUPIXENT 300 MG/2ML SOSY Inject 1 pen/syringe subcutaneously every other week STARTING ON DAY 15 4 mL 5  . furosemide (LASIX) 20 MG tablet Take 1 tablet (20 mg total) by mouth every morning. 30 tablet 2  . gabapentin (NEURONTIN) 400 MG capsule Take 2 capsules (800 mg total) by mouth 3 (three) times daily. 180 capsule 3  . glucose blood (RELION GLUCOSE TEST STRIPS) test strip 1 each by Other route 4 (four) times daily. ICD code E11.65 400 each 3  . HUMULIN R U-500 KWIKPEN 500 UNIT/ML  kwikpen Inject 120-180 Units into the skin 3 (three) times daily. 180 U before breakfast, 160 U before lunch, 140 U before dinner 27 mL 11  . hydrOXYzine (ATARAX/VISTARIL) 25 MG tablet TAKE 1 TABLET BY MOUTH AT BEDTIME AS NEEDED FOR ANXIETY. (Patient taking differently: Take 25 mg by mouth at bedtime as needed for anxiety. ) 30 tablet 6  . Insulin Pen Needle (B-D ULTRAFINE III SHORT PEN) 31G X 8 MM MISC 1 application by Does not apply route daily. 100 each 3  . Insulin Syringe-Needle U-100 (BD INSULIN SYRINGE ULTRAFINE) 31G X 15/64" 1 ML MISC 1 each by Does not apply route 3 (three) times daily. 300 each 3  . Lancet Devices (ACCU-CHEK SOFTCLIX) lancets Use as instructed 1 each 0  . levothyroxine (SYNTHROID, LEVOTHROID) 25 MCG tablet Take 25 mcg by mouth daily.  3  . losartan (COZAAR) 100 MG tablet Take 1 tablet (100 mg  total) by mouth every morning. 30 tablet 6  . metFORMIN (GLUCOPHAGE) 1000 MG tablet TAKE 1 TABLET BY MOUTH TWICE DAILY WITH A MEAL (Patient taking differently: Take 1,000 mg by mouth 2 (two) times daily with a meal. ) 60 tablet 2  . methocarbamol (ROBAXIN) 750 MG tablet Take 1 tablet (750 mg total) by mouth 3 (three) times daily. MUST MAKE APPT FOR FURTHER REFILLS 60 tablet 0  . Misc. Devices (CANE) MISC 1 each by Does not apply route daily. 1 each 0  . montelukast (SINGULAIR) 10 MG tablet Take 1 tablet (10 mg total) by mouth at bedtime. 30 tablet 6  . omeprazole (PRILOSEC) 20 MG capsule TAKE ONE CAPSULE BY MOUTH EVERY DAY BEFORE BREAKFAST (Patient taking differently: Take 20 mg by mouth daily. BEFORE BREAKFAST) 30 capsule 6  . Oyster Shell Calcium 500 MG TABS Take 1 tablet (1,250 mg total) by mouth daily. 90 tablet 3  . PARoxetine (PAXIL) 20 MG tablet Take 1 tablet (20 mg total) by mouth daily. 30 tablet 6  . predniSONE (DELTASONE) 1 MG tablet Take 3 tablets (3 mg total) by mouth daily. 90 tablet 2  . ranitidine (ZANTAC) 300 MG tablet Take 1 tablet (300 mg total) by mouth at bedtime.  30 tablet 5  . ReliOn Ultra Thin Lancets MISC 1 each by Does not apply route 4 (four) times daily. ICD code E11.65 400 each 3  . Respiratory Therapy Supplies (FLUTTER) DEVI Use as directed 1 each 0  . Spacer/Aero-Holding Chambers (AEROCHAMBER MV) inhaler Use as instructed 1 each 0  . spironolactone (ALDACTONE) 25 MG tablet Take 0.5 tablets (12.5 mg total) by mouth daily. 45 tablet 3  . ULTICARE MICRO PEN NEEDLES 32G X 4 MM MISC USE AS DIRECTED 100 each 2  . verapamil (CALAN-SR) 180 MG CR tablet Take 2 tablets (360 mg total) by mouth at bedtime. 180 tablet 2  . atorvastatin (LIPITOR) 40 MG tablet Take 1 tablet (40 mg total) by mouth daily. 90 tablet 3   Current Facility-Administered Medications  Medication Dose Route Frequency Provider Last Rate Last Dose  . betamethasone acetate-betamethasone sodium phosphate (CELESTONE) injection 3 mg  3 mg Intramuscular Once Evans, Brent M, DPM      . umeclidinium bromide (INCRUSE ELLIPTA) 62.5 MCG/INH 1 puff  1 puff Inhalation Daily Ladell Pier, MD       Facility-Administered Medications Ordered in Other Visits  Medication Dose Route Frequency Provider Last Rate Last Dose  . DOBUTamine (DOBUTREX) 1,000 mcg/mL in dextrose 5% 250 mL infusion  30 mcg/kg/min Intravenous Continuous Croitoru, Mihai, MD 145.3 mL/hr at 04/22/15 1300 30 mcg/kg/min at 04/22/15 1300    PHYSICAL EXAMINATION: ECOG PERFORMANCE STATUS: 1 - Symptomatic but completely ambulatory  Today's Vitals   09/11/18 1121 09/11/18 1125  BP: 135/77   Pulse: 84   Resp: 20   Temp: 98.4 F (36.9 C)   TempSrc: Oral   SpO2: 96%   Weight: 199 lb 12.8 oz (90.6 kg)   Height: '4\' 6"'$  (1.372 m)   PainSc:  0-No pain   Body mass index is 48.17 kg/m.  Filed Weights   09/11/18 1121  Weight: 199 lb 12.8 oz (90.6 kg)    GENERAL: alert, no distress and comfortable, obese SKIN: skin color, texture, turgor are normal, no rashes or significant lesions EYES: conjunctiva are pink and non-injected,  sclera clear OROPHARYNX: no exudate, no erythema; lips, buccal mucosa, and tongue normal  NECK: supple, non-tender LYMPH:  no palpable lymphadenopathy in the  cervical or axillary  LUNGS: clear to auscultation and percussion with normal breathing effort HEART: regular rate & rhythm and no murmurs and no lower extremity edema ABDOMEN: soft, non-tender, non-distended, normal bowel sounds Musculoskeletal: no cyanosis of digits and no clubbing  PSYCH: alert & oriented x 3, fluent speech  LABORATORY DATA:  I have reviewed the data as listed    Component Value Date/Time   NA 139 09/06/2018 1749   NA 138 02/09/2018 1121   K 4.0 09/06/2018 1749   CL 102 09/06/2018 1749   CO2 30 09/06/2018 1749   GLUCOSE 130 (H) 09/06/2018 1749   BUN 8 09/06/2018 1749   BUN 17 02/09/2018 1121   CREATININE 1.09 (H) 09/06/2018 1749   CREATININE 1.09 (H) 07/23/2018 1026   CREATININE 0.84 11/07/2016 1641   CALCIUM 9.4 09/06/2018 1749   PROT 8.0 09/06/2018 1749   PROT 7.3 03/27/2017 1520   ALBUMIN 3.7 09/06/2018 1749   ALBUMIN 3.9 03/27/2017 1520   AST 53 (H) 09/06/2018 1749   ALT 62 (H) 09/06/2018 1749   ALKPHOS 86 09/06/2018 1749   BILITOT 0.6 09/06/2018 1749   BILITOT <0.2 03/27/2017 1520   GFRNONAA >60 09/06/2018 1749   GFRNONAA 60 (L) 07/23/2018 1026   GFRNONAA 85 11/07/2016 1641   GFRAA >60 09/06/2018 1749   GFRAA >60 07/23/2018 1026   GFRAA >89 11/07/2016 1641    No results found for: SPEP, UPEP  Lab Results  Component Value Date   WBC 9.9 09/11/2018   NEUTROABS 5.6 09/11/2018   HGB 12.1 09/11/2018   HCT 38.4 09/11/2018   MCV 81.0 09/11/2018   PLT 399 09/11/2018      Chemistry      Component Value Date/Time   NA 139 09/06/2018 1749   NA 138 02/09/2018 1121   K 4.0 09/06/2018 1749   CL 102 09/06/2018 1749   CO2 30 09/06/2018 1749   BUN 8 09/06/2018 1749   BUN 17 02/09/2018 1121   CREATININE 1.09 (H) 09/06/2018 1749   CREATININE 1.09 (H) 07/23/2018 1026   CREATININE 0.84  11/07/2016 1641      Component Value Date/Time   CALCIUM 9.4 09/06/2018 1749   ALKPHOS 86 09/06/2018 1749   AST 53 (H) 09/06/2018 1749   ALT 62 (H) 09/06/2018 1749   BILITOT 0.6 09/06/2018 1749   BILITOT <0.2 03/27/2017 1520       RADIOGRAPHIC STUDIES: I have personally reviewed the radiological images as listed below and agreed with the findings in the report. Ct Abdomen Pelvis W Contrast  Result Date: 09/06/2018 CLINICAL DATA:  Upper abdominal pain and nausea EXAM: CT ABDOMEN AND PELVIS WITH CONTRAST TECHNIQUE: Multidetector CT imaging of the abdomen and pelvis was performed using the standard protocol following bolus administration of intravenous contrast. CONTRAST:  166m OMNIPAQUE IOHEXOL 300 MG/ML  SOLN COMPARISON:  January 03, 2016 FINDINGS: Lower chest: There is bibasilar atelectasis. Hepatobiliary: There is hepatic steatosis. Liver measures 20.1 cm in length. No focal liver lesions are appreciable. Gallbladder wall is not appreciably thickened. There is no biliary duct dilatation. Pancreas: No pancreatic mass or inflammatory focus. 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 No other splenic lesions are appreciable. Adrenals/Urinary Tract: Adrenals bilaterally appear unremarkable. Kidneys bilaterally show no evident mass or hydronephrosis on either side. There is a 1 mm calculus in the lower pole left kidney. There is a 1 mm calculus in the mid right kidney with a 2 mm calculus  in the lower pole right kidney. There is no ureteral calculus on either side. Urinary bladder is midline with wall thickness within normal limits. Stomach/Bowel: There is no appreciable bowel wall or mesenteric thickening. There is no appreciable bowel obstruction. There is no free air or portal venous air. Vascular/Lymphatic: Aorta is tortuous without aneurysm. No focal vascular lesions are evident. No adenopathy is appreciable in the abdomen or pelvis. Reproductive:  Uterus is anteverted. There is no appreciable pelvic mass. Other: Appendix appears normal. There is no abscess or ascites in the abdomen or pelvis. There is thinning of the rectus muscle in the midline. Musculoskeletal: There is anterior wedging of multiple lower thoracic and lumbar vertebral bodies, essentially stable. No blastic or lytic bone lesions are evident. No intramuscular lesions are appreciable. IMPRESSION: 1. Small nonobstructing calculi in each kidney. No hydronephrosis or ureteral calculus on either side. 2. No evident bowel obstruction. No abscess in the abdomen or pelvis. Appendix appears normal. 3.  Prominent liver with hepatic steatosis. 4. Persistent wedging of multiple thoracic and lumbar vertebral bodies. Electronically Signed   By: Lowella Grip III M.D.   On: 09/06/2018 21:29

## 2018-09-11 ENCOUNTER — Inpatient Hospital Stay: Payer: Medicaid Other

## 2018-09-11 ENCOUNTER — Encounter: Payer: Self-pay | Admitting: Hematology

## 2018-09-11 ENCOUNTER — Inpatient Hospital Stay (HOSPITAL_BASED_OUTPATIENT_CLINIC_OR_DEPARTMENT_OTHER): Payer: Medicaid Other | Admitting: Hematology

## 2018-09-11 ENCOUNTER — Telehealth: Payer: Self-pay | Admitting: Hematology

## 2018-09-11 VITALS — BP 135/77 | HR 84 | Temp 98.4°F | Resp 20 | Ht <= 58 in | Wt 199.8 lb

## 2018-09-11 DIAGNOSIS — M7989 Other specified soft tissue disorders: Secondary | ICD-10-CM

## 2018-09-11 DIAGNOSIS — D509 Iron deficiency anemia, unspecified: Secondary | ICD-10-CM

## 2018-09-11 DIAGNOSIS — E119 Type 2 diabetes mellitus without complications: Secondary | ICD-10-CM

## 2018-09-11 DIAGNOSIS — E559 Vitamin D deficiency, unspecified: Secondary | ICD-10-CM

## 2018-09-11 DIAGNOSIS — E785 Hyperlipidemia, unspecified: Secondary | ICD-10-CM

## 2018-09-11 DIAGNOSIS — K7689 Other specified diseases of liver: Secondary | ICD-10-CM

## 2018-09-11 DIAGNOSIS — D500 Iron deficiency anemia secondary to blood loss (chronic): Secondary | ICD-10-CM

## 2018-09-11 DIAGNOSIS — R202 Paresthesia of skin: Secondary | ICD-10-CM

## 2018-09-11 DIAGNOSIS — M8580 Other specified disorders of bone density and structure, unspecified site: Secondary | ICD-10-CM

## 2018-09-11 DIAGNOSIS — R42 Dizziness and giddiness: Secondary | ICD-10-CM

## 2018-09-11 DIAGNOSIS — G4700 Insomnia, unspecified: Secondary | ICD-10-CM

## 2018-09-11 DIAGNOSIS — Z79899 Other long term (current) drug therapy: Secondary | ICD-10-CM

## 2018-09-11 DIAGNOSIS — D72829 Elevated white blood cell count, unspecified: Secondary | ICD-10-CM

## 2018-09-11 DIAGNOSIS — N920 Excessive and frequent menstruation with regular cycle: Secondary | ICD-10-CM | POA: Diagnosis not present

## 2018-09-11 DIAGNOSIS — M1990 Unspecified osteoarthritis, unspecified site: Secondary | ICD-10-CM

## 2018-09-11 DIAGNOSIS — E039 Hypothyroidism, unspecified: Secondary | ICD-10-CM

## 2018-09-11 DIAGNOSIS — I10 Essential (primary) hypertension: Secondary | ICD-10-CM

## 2018-09-11 DIAGNOSIS — K760 Fatty (change of) liver, not elsewhere classified: Secondary | ICD-10-CM

## 2018-09-11 DIAGNOSIS — Z7982 Long term (current) use of aspirin: Secondary | ICD-10-CM

## 2018-09-11 DIAGNOSIS — I2510 Atherosclerotic heart disease of native coronary artery without angina pectoris: Secondary | ICD-10-CM

## 2018-09-11 DIAGNOSIS — F329 Major depressive disorder, single episode, unspecified: Secondary | ICD-10-CM

## 2018-09-11 DIAGNOSIS — J45909 Unspecified asthma, uncomplicated: Secondary | ICD-10-CM

## 2018-09-11 DIAGNOSIS — Z794 Long term (current) use of insulin: Secondary | ICD-10-CM

## 2018-09-11 DIAGNOSIS — R1013 Epigastric pain: Secondary | ICD-10-CM

## 2018-09-11 DIAGNOSIS — N200 Calculus of kidney: Secondary | ICD-10-CM

## 2018-09-11 DIAGNOSIS — D473 Essential (hemorrhagic) thrombocythemia: Secondary | ICD-10-CM

## 2018-09-11 DIAGNOSIS — I700 Atherosclerosis of aorta: Secondary | ICD-10-CM

## 2018-09-11 LAB — CBC WITH DIFFERENTIAL (CANCER CENTER ONLY)
Abs Immature Granulocytes: 0.05 10*3/uL (ref 0.00–0.07)
Basophils Absolute: 0.1 10*3/uL (ref 0.0–0.1)
Basophils Relative: 1 %
Eosinophils Absolute: 0.4 10*3/uL (ref 0.0–0.5)
Eosinophils Relative: 4 %
HCT: 38.4 % (ref 36.0–46.0)
Hemoglobin: 12.1 g/dL (ref 12.0–15.0)
Immature Granulocytes: 1 %
Lymphocytes Relative: 32 %
Lymphs Abs: 3.1 10*3/uL (ref 0.7–4.0)
MCH: 25.5 pg — ABNORMAL LOW (ref 26.0–34.0)
MCHC: 31.5 g/dL (ref 30.0–36.0)
MCV: 81 fL (ref 80.0–100.0)
Monocytes Absolute: 0.7 10*3/uL (ref 0.1–1.0)
Monocytes Relative: 7 %
Neutro Abs: 5.6 10*3/uL (ref 1.7–7.7)
Neutrophils Relative %: 55 %
Platelet Count: 399 10*3/uL (ref 150–400)
RBC: 4.74 MIL/uL (ref 3.87–5.11)
RDW: 21.4 % — ABNORMAL HIGH (ref 11.5–15.5)
WBC Count: 9.9 10*3/uL (ref 4.0–10.5)
nRBC: 0 % (ref 0.0–0.2)

## 2018-09-11 LAB — IRON AND TIBC
Iron: 33 ug/dL — ABNORMAL LOW (ref 41–142)
Saturation Ratios: 14 % — ABNORMAL LOW (ref 21–57)
TIBC: 245 ug/dL (ref 236–444)
UIBC: 212 ug/dL (ref 120–384)

## 2018-09-11 LAB — FERRITIN: Ferritin: 235 ng/mL (ref 11–307)

## 2018-09-11 NOTE — Telephone Encounter (Signed)
Scheduled apt per 12/31 los - gave patient AVS and calender per los.

## 2018-09-19 ENCOUNTER — Encounter: Payer: Self-pay | Admitting: Pulmonary Disease

## 2018-09-19 ENCOUNTER — Ambulatory Visit (INDEPENDENT_AMBULATORY_CARE_PROVIDER_SITE_OTHER): Payer: Medicaid Other | Admitting: Pulmonary Disease

## 2018-09-19 VITALS — BP 114/68 | HR 65 | Ht <= 58 in | Wt 201.0 lb

## 2018-09-19 DIAGNOSIS — B370 Candidal stomatitis: Secondary | ICD-10-CM

## 2018-09-19 DIAGNOSIS — J45998 Other asthma: Secondary | ICD-10-CM | POA: Diagnosis not present

## 2018-09-19 DIAGNOSIS — J4550 Severe persistent asthma, uncomplicated: Secondary | ICD-10-CM

## 2018-09-19 MED ORDER — ALBUTEROL SULFATE HFA 108 (90 BASE) MCG/ACT IN AERS: 2.0000 | INHALATION_SPRAY | RESPIRATORY_TRACT | 6 refills | Status: DC | PRN

## 2018-09-19 MED ORDER — PREDNISONE 1 MG PO TABS: 2.0000 mg | ORAL_TABLET | Freq: Every day | ORAL | 2 refills | Status: DC

## 2018-09-19 NOTE — Progress Notes (Signed)
Subjective:    Patient ID: Raven Turner, female    DOB: 1971/03/31, 48 y.o.   MRN: [id number]  Synopsis: Former patient of Dr. Melvyn Novas with recurrent wheeze, shortness of breath and cough felt to be due to either vocal cord dysfunction or asthma.  Evaluated by [facility] voice center in 2017, no paradoxical movements of the vocal cords noted   HPI Chief Complaint  Patient presents with  . Follow-up    pt states she is doing well, denies any current complaints.    Raven Turner says that she has been doing well since the last visit from a respiratory standpoint.  She has dealt with a lot of nausea and vomiting and she ended up going to the emergency room on the day after Christmas for further evaluation of the same.  However, she says that her breathing has significantly improved.  She says that she only needs to use albuterol 1-2 times a week.  She continues to take her other inhalers as prescribed and she does not have any problems using her Dupixent.    Past Medical History:  Diagnosis Date  . Anemia   . Asthma   . Bilateral hand numbness 06/25/2015  . Bone pain 02/27/2015   Diffuse bone pain in legs mostly but also in arms    . Breast pain, left 03/27/2015  . Callus of heel 12/16/2014  . Compression fracture   . Compression fracture of L1 lumbar vertebra (HCC) 08/26/2014  . Compression fracture of T12 vertebra ([place]) 08/28/2014  . COPD (chronic obstructive pulmonary disease) ([place]) 2013  . Cushing syndrome ([place]) 2013   . Depression   . Diabetes ([place]) 2013   . Diabetes mellitus type 2, uncontrolled ([place]) 08/26/2014   Sees Dr. Katy Fitch.  Last visit 11/27/14.  No diabetic retinopathy    . Diabetes mellitus with neuropathy ([place])   . Diastolic dysfunction without heart failure 10/14/2014   Grade 2 diastolic dysfunction 2/2 pulm HTN   . Generalized anxiety disorder 03/27/2015  . HTN (hypertension) 2005   . Hx of cataract surgery 11/03/2014  . LUQ abdominal pain 07/17/2015  .  Medication management 05/12/2015  . Morbid obesity ([place]) 09/08/2014  . 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 Non-English speaking patient 09/08/2014  . OSA (obstructive sleep apnea) 05/20/2015  . Osteopenia 11/19/2013   Dx with osteopenia in lumbar vertebra with partial compression of L1  . Pain due to onychomycosis of toenail 03/27/2015  . Secondary Cushing's syndrome/ iatrogenic   . Skin rash 07/17/2015  . Tachycardia 01/13/2015  . Upper airway cough syndrome 09/05/2014   Followed in Pulmonary clinic/ [facility]/ [place]  - Trial off advair      09/05/2014 >>>  - trial off theoph 09/17/2014 and added flutter  - rec reduce dulera to 100 2 bid 07/13/2015     . Vision changes 06/25/2015  . Vitamin D deficiency 08/28/2014        Review of Systems  Constitutional: Negative for chills, fatigue and fever.  HENT: Negative for postnasal drip, rhinorrhea and sinus pressure.   Respiratory: Positive for shortness of breath. Negative for cough and wheezing.   Cardiovascular: Negative for chest pain, palpitations and leg swelling.  Hematological: Bruises/bleeds easily: .dbmfu.       Objective:   Physical Exam Vitals:   09/19/18 0940  BP: 114/68  Pulse: 65  SpO2: 99%  Weight: 201 lb (91.2 kg)  Height: 4' 6"  (1.372 m)     RA  Gen: obese but  well appearing HENT: OP clear, TM's clear, neck supple PULM: CTA B, normal percussion CV: RRR, no mgr, trace edema GI: BS+, soft, nontender Derm: no cyanosis or rash Psyche: normal mood and affect    PFT 11/03/14  PFTs p am dulera 100 on 30 mg prednisone  FEV1 1.56(73%) ratio 85 =  No obst, including fef 25-75%, only abn is reduction in ERV spirometry 03/31/15 1.48 (62%) ratio 82  p am dulera and while symptomatic requesting saba  Labs July 2017 IgE 55 (normal), CBC without eosinophils February 2019 CBC with differential showed 400 eosinophils per deciliter  Chest imaging: March 2019 high-resolution CT scan of the chest images independently  reviewed showing some mild air trapping but no interstitial lung disease.      Assessment & Plan:  Severe persistent asthma without complication  Thrush  Discussion: This has been a stable interval for Ms. malaise.  She has not had an exacerbation since the last visit.  She has done very well with the intervention of Dupixent.  Plan: Severe persistent asthma: Continue Dupixent injections every 14 days Continue Incruse daily Continue Dulera 2 puffs twice a day Stay active, exercise regularly Decrease prednisone to 2 mg daily, maintain that dose until the next visit then we will consider whether or not we can lower it even further Keep using albuterol as needed for chest tightness wheezing or shortness of breath I am glad your flu shot is up-to-date Practice good hand hygiene  Thrush: Be sure to rinse your mouth well after using inhalers  We will see you back in 3 months or sooner if needed      Current Outpatient Medications:  .  albuterol (PROVENTIL HFA;VENTOLIN HFA) 108 (90 Base) MCG/ACT inhaler, Inhale 2 puffs into the lungs every 4 (four) hours as needed for wheezing or shortness of breath (((PLAN B))). (Patient taking differently: Inhale 2 puffs into the lungs every 4 (four) hours as needed for wheezing or shortness of breath. ), Disp: 18 g, Rfl: 6 .  albuterol (PROVENTIL) (2.5 MG/3ML) 0.083% nebulizer solution, USE 1 VIAL VIA NEBULIZER EVERY 4 HOURS AS NEEDED FOR WHEEZING OR SHORTNESS OF BREATH (Patient taking differently: Take 2.5 mg by nebulization every 4 (four) hours as needed for wheezing or shortness of breath. ), Disp: 90 mL, Rfl: 0 .  aspirin EC 81 MG tablet, Take 1 tablet (81 mg total) by mouth daily., Disp: 90 tablet, Rfl: 3 .  Blood Glucose Monitoring Suppl (ACCU-CHEK AVIVA PLUS) W/DEVICE KIT, Used as directed, Disp: 1 kit, Rfl: 0 .  buprenorphine (BUTRANS - DOSED MCG/HR) 5 MCG/HR PTWK patch, Place 5 mcg onto the skin once a week., Disp: , Rfl:  .  cabergoline  (DOSTINEX) 0.5 MG tablet, Take 0.5 tablets (0.25 mg total) by mouth 2 (two) times a week., Disp: 10 tablet, Rfl: 3 .  cholecalciferol (VITAMIN D) 1000 UNITS tablet, Take 1,000 Units by mouth every morning., Disp: , Rfl:  .  cloNIDine (CATAPRES) 0.2 MG tablet, TAKE 1 TABLET(0.2 MG) BY MOUTH THREE TIMES DAILY, Disp: 90 tablet, Rfl: 2 .  DULERA 200-5 MCG/ACT AERO, INHALE 2 PUFFS INTO THE LUNGS TWICE DAILY, Disp: 13 g, Rfl: 3 .  DUPIXENT 300 MG/2ML SOSY, Inject 1 pen/syringe subcutaneously every other week STARTING ON DAY 15, Disp: 4 mL, Rfl: 5 .  furosemide (LASIX) 20 MG tablet, Take 1 tablet (20 mg total) by mouth every morning., Disp: 30 tablet, Rfl: 2 .  gabapentin (NEURONTIN) 400 MG capsule, Take 2 capsules (800 mg  total) by mouth 3 (three) times daily., Disp: 180 capsule, Rfl: 3 .  glucose blood (RELION GLUCOSE TEST STRIPS) test strip, 1 each by Other route 4 (four) times daily. ICD code E11.65, Disp: 400 each, Rfl: 3 .  HUMULIN R U-500 KWIKPEN 500 UNIT/ML kwikpen, Inject 120-180 Units into the skin 3 (three) times daily. 180 U before breakfast, 160 U before lunch, 140 U before dinner, Disp: 27 mL, Rfl: 11 .  hydrOXYzine (ATARAX/VISTARIL) 25 MG tablet, TAKE 1 TABLET BY MOUTH AT BEDTIME AS NEEDED FOR ANXIETY. (Patient taking differently: Take 25 mg by mouth at bedtime as needed for anxiety. ), Disp: 30 tablet, Rfl: 6 .  Insulin Pen Needle (B-D ULTRAFINE III SHORT PEN) 31G X 8 MM MISC, 1 application by Does not apply route daily., Disp: 100 each, Rfl: 3 .  Insulin Syringe-Needle U-100 (BD INSULIN SYRINGE ULTRAFINE) 31G X 15/64" 1 ML MISC, 1 each by Does not apply route 3 (three) times daily., Disp: 300 each, Rfl: 3 .  Lancet Devices (ACCU-CHEK SOFTCLIX) lancets, Use as instructed, Disp: 1 each, Rfl: 0 .  levothyroxine (SYNTHROID, LEVOTHROID) 25 MCG tablet, Take 25 mcg by mouth daily., Disp: , Rfl: 3 .  losartan (COZAAR) 100 MG tablet, Take 1 tablet (100 mg total) by mouth every morning., Disp: 30  tablet, Rfl: 6 .  metFORMIN (GLUCOPHAGE) 1000 MG tablet, TAKE 1 TABLET BY MOUTH TWICE DAILY WITH A MEAL (Patient taking differently: Take 1,000 mg by mouth 2 (two) times daily with a meal. ), Disp: 60 tablet, Rfl: 2 .  methocarbamol (ROBAXIN) 750 MG tablet, Take 1 tablet (750 mg total) by mouth 3 (three) times daily. MUST MAKE APPT FOR FURTHER REFILLS, Disp: 60 tablet, Rfl: 0 .  Misc. Devices (CANE) MISC, 1 each by Does not apply route daily., Disp: 1 each, Rfl: 0 .  montelukast (SINGULAIR) 10 MG tablet, Take 1 tablet (10 mg total) by mouth at bedtime., Disp: 30 tablet, Rfl: 6 .  omeprazole (PRILOSEC) 20 MG capsule, TAKE ONE CAPSULE BY MOUTH EVERY DAY BEFORE BREAKFAST (Patient taking differently: Take 20 mg by mouth daily. BEFORE BREAKFAST), Disp: 30 capsule, Rfl: 6 .  Oyster Shell Calcium 500 MG TABS, Take 1 tablet (1,250 mg total) by mouth daily., Disp: 90 tablet, Rfl: 3 .  PARoxetine (PAXIL) 20 MG tablet, Take 1 tablet (20 mg total) by mouth daily., Disp: 30 tablet, Rfl: 6 .  predniSONE (DELTASONE) 1 MG tablet, Take 3 tablets (3 mg total) by mouth daily., Disp: 90 tablet, Rfl: 2 .  ranitidine (ZANTAC) 300 MG tablet, Take 1 tablet (300 mg total) by mouth at bedtime., Disp: 30 tablet, Rfl: 5 .  ReliOn Ultra Thin Lancets MISC, 1 each by Does not apply route 4 (four) times daily. ICD code E11.65, Disp: 400 each, Rfl: 3 .  Respiratory Therapy Supplies (FLUTTER) DEVI, Use as directed, Disp: 1 each, Rfl: 0 .  Spacer/Aero-Holding Chambers (AEROCHAMBER MV) inhaler, Use as instructed, Disp: 1 each, Rfl: 0 .  spironolactone (ALDACTONE) 25 MG tablet, Take 0.5 tablets (12.5 mg total) by mouth daily., Disp: 45 tablet, Rfl: 3 .  ULTICARE MICRO PEN NEEDLES 32G X 4 MM MISC, USE AS DIRECTED, Disp: 100 each, Rfl: 2 .  verapamil (CALAN-SR) 180 MG CR tablet, Take 2 tablets (360 mg total) by mouth at bedtime., Disp: 180 tablet, Rfl: 2 .  atorvastatin (LIPITOR) 40 MG tablet, Take 1 tablet (40 mg total) by mouth daily.,  Disp: 90 tablet, Rfl: 3  Current Facility-Administered Medications:  .  betamethasone acetate-betamethasone sodium phosphate (CELESTONE) injection 3 mg, 3 mg, Intramuscular, Once, Evans, Brent M, DPM .  umeclidinium bromide (INCRUSE ELLIPTA) 62.5 MCG/INH 1 puff, 1 puff, Inhalation, Daily, Ladell Pier, MD  Facility-Administered Medications Ordered in Other Visits:  .  DOBUTamine (DOBUTREX) 1,000 mcg/mL in dextrose 5% 250 mL infusion, 30 mcg/kg/min, Intravenous, Continuous, Croitoru, Mihai, MD, Last Rate: 145.3 mL/hr at 04/22/15 1300, 30 mcg/kg/min at 04/22/15 1300

## 2018-09-19 NOTE — Addendum Note (Signed)
Addended by: Len Blalock on: 09/19/2018 11:27 AM   Modules accepted: Orders

## 2018-09-19 NOTE — Patient Instructions (Signed)
Severe persistent asthma: Continue Dupixent injections every 14 days Continue Incruse daily Continue Dulera 2 puffs twice a day Stay active, exercise regularly Decrease prednisone to 2 mg daily, maintain that dose until the next visit then we will consider whether or not we can lower it even further Keep using albuterol as needed for chest tightness wheezing or shortness of breath I am glad your flu shot is up-to-date Practice good hand hygiene  Thrush: Be sure to rinse your mouth well after using inhalers  We will see you back in 3 months or sooner if needed

## 2018-11-07 DIAGNOSIS — E039 Hypothyroidism, unspecified: Secondary | ICD-10-CM | POA: Insufficient documentation

## 2018-11-07 DIAGNOSIS — N200 Calculus of kidney: Secondary | ICD-10-CM | POA: Insufficient documentation

## 2018-11-07 DIAGNOSIS — R3129 Other microscopic hematuria: Secondary | ICD-10-CM | POA: Insufficient documentation

## 2018-11-12 ENCOUNTER — Other Ambulatory Visit: Payer: Self-pay | Admitting: Internal Medicine

## 2018-11-12 DIAGNOSIS — M545 Low back pain, unspecified: Secondary | ICD-10-CM

## 2018-11-12 DIAGNOSIS — G8929 Other chronic pain: Secondary | ICD-10-CM

## 2018-11-16 ENCOUNTER — Other Ambulatory Visit (HOSPITAL_COMMUNITY): Payer: Self-pay | Admitting: Cardiology

## 2018-11-16 ENCOUNTER — Telehealth: Payer: Self-pay

## 2018-11-16 DIAGNOSIS — R079 Chest pain, unspecified: Secondary | ICD-10-CM

## 2018-11-16 NOTE — Telephone Encounter (Signed)
Notes on file, referral sent to scheduling.  

## 2018-11-29 ENCOUNTER — Encounter (HOSPITAL_COMMUNITY): Payer: Self-pay

## 2018-11-29 ENCOUNTER — Encounter (HOSPITAL_COMMUNITY): Admission: RE | Admit: 2018-11-29 | Payer: Medicaid Other | Source: Ambulatory Visit

## 2018-12-07 ENCOUNTER — Telehealth: Payer: Self-pay | Admitting: Pulmonary Disease

## 2018-12-07 MED ORDER — MOMETASONE FURO-FORMOTEROL FUM 200-5 MCG/ACT IN AERO: 2.0000 | INHALATION_SPRAY | Freq: Two times a day (BID) | RESPIRATORY_TRACT | 3 refills | Status: DC

## 2018-12-07 NOTE — Telephone Encounter (Signed)
Called and spoke with patient regarding refill of rx of dulera 200 inhaler Placed order today for dulera 200 to [facility] expressed and verbalized understanding, no concerns Nothing further needed.

## 2018-12-14 ENCOUNTER — Telehealth: Payer: Self-pay | Admitting: Physician Assistant

## 2018-12-14 NOTE — Telephone Encounter (Signed)
   Primary Cardiologist:  Dorris Carnes, MD   Called patient  to convert office visit 4/14 to virtual visit due to COVID 19. Patient and husband does not speaks or understand  [place] fluently. They are interested in virtual visit but unsure about possibility of interpreter during virtual visit. Will review with staff. If not available, can be reschedule 6-8 weeks.    [place], [place]  12/14/2018 12:51 PM         .

## 2018-12-19 ENCOUNTER — Telehealth: Payer: Self-pay | Admitting: Physician Assistant

## 2018-12-19 NOTE — Telephone Encounter (Signed)
Virtual Visit Pre-Appointment Phone Call  Steps For Call:  1. Confirm consent - "In the setting of the current Covid19 crisis, you are scheduled for a (phone or video) visit with your provider on (date) at (time).  Just as we do with many in-office visits, in order for you to participate in this visit, we must obtain consent.  If you'd like, I can send this to your mychart (if signed up) or email for you to review.  Otherwise, I can obtain your verbal consent now.  All virtual visits are billed to your insurance company just like a normal visit would be.  By agreeing to a virtual visit, we'd like you to understand that the technology does not allow for your provider to perform an examination, and thus may limit your provider's ability to fully assess your condition.  Finally, though the technology is pretty good, we cannot assure that it will always work on either your or our end, and in the setting of a video visit, we may have to convert it to a phone-only visit.  In either situation, we cannot ensure that we have a secure connection.  Are you willing to proceed?"  2. Give patient instructions for WebEx download to smartphone as below if video visit  3. Advise patient to be prepared with any vital sign or heart rhythm information, their current medicines, and a piece of paper and pen handy for any instructions they may receive the day of their visit  4. Inform patient they will receive a phone call 15 minutes prior to their appointment time (may be from unknown caller ID) so they should be prepared to answer  5. Confirm that appointment type is correct in Epic appointment notes (video vs telephone)    TELEPHONE CALL NOTE  Raven Turner has been deemed a candidate for a follow-up tele-health visit to limit community exposure during the Covid-19 pandemic. I spoke with the patient via phone to ensure availability of phone/video source, confirm preferred email & phone number, and  discuss instructions and expectations.  I reminded Raven Turner to be prepared with any vital sign and/or heart rhythm information that could potentially be obtained via home monitoring, at the time of her visit. I reminded Raven Turner to expect a phone call at the time of her visit if her visit.  Did the patient verbally acknowledge consent to treatment? 12/25/2018  Raven Turner, [place] 12/19/2018 2:29 PM   DOWNLOADING THE WEBEX SOFTWARE TO SMARTPHONE  - If Apple, go to [employer] and type in WebEx in the search bar. [place] [employer], the blue/green circle. The app is free but as with any other app downloads, their phone may require them to verify saved payment information or Apple password. The patient does NOT have to create an account.  - If Android, ask patient to go to [employer] and type in WebEx in the search bar. [place] [employer], the blue/green circle. The app is free but as with any other app downloads, their phone may require them to verify saved payment information or Android password. The patient does NOT have to create an account.   CONSENT FOR TELE-HEALTH VISIT - PLEASE REVIEW  I hereby voluntarily request, consent and authorize [facility] and its employed or contracted physicians, physician assistants, nurse practitioners or other licensed health care professionals (the Practitioner), to provide me with telemedicine health care services (the "Services") as deemed necessary by the treating Practitioner. I  acknowledge and consent to receive the Services by the Practitioner via telemedicine. I understand that the telemedicine visit will involve communicating with the Practitioner through live audiovisual communication technology and the disclosure of certain medical information by electronic transmission. I acknowledge that I have been given the opportunity to request an in-person assessment or other available alternative prior to  the telemedicine visit and am voluntarily participating in the telemedicine visit.  I understand that I have the right to withhold or withdraw my consent to the use of telemedicine in the course of my care at any time, without affecting my right to future care or treatment, and that the Practitioner or I may terminate the telemedicine visit at any time. I understand that I have the right to inspect all information obtained and/or recorded in the course of the telemedicine visit and may receive copies of available information for a reasonable fee.  I understand that some of the potential risks of receiving the Services via telemedicine include:  Marland Kitchen Delay or interruption in medical evaluation due to technological equipment failure or disruption; . Information transmitted may not be sufficient (e.g. poor resolution of images) to allow for appropriate medical decision making by the Practitioner; and/or  . In rare instances, security protocols could fail, causing a breach of personal health information.  Furthermore, I acknowledge that it is my responsibility to provide information about my medical history, conditions and care that is complete and accurate to the best of my ability. I acknowledge that Practitioner's advice, recommendations, and/or decision may be based on factors not within their control, such as incomplete or inaccurate data provided by me or distortions of diagnostic images or specimens that may result from electronic transmissions. I understand that the practice of medicine is not an exact science and that Practitioner makes no warranties or guarantees regarding treatment outcomes. I acknowledge that I will receive a copy of this consent concurrently upon execution via email to the email address I last provided but may also request a printed copy by calling the office of [facility].    I understand that my insurance will be billed for this visit.   I have read or had this consent read to  me. . I understand the contents of this consent, which adequately explains the benefits and risks of the Services being provided via telemedicine.  . I have been provided ample opportunity to ask questions regarding this consent and the Services and have had my questions answered to my satisfaction. . I give my informed consent for the services to be provided through the use of telemedicine in my medical care  By participating in this telemedicine visit I agree to the above.

## 2018-12-19 NOTE — Telephone Encounter (Signed)
Spoke with patient via interpretor. Confirmed all demographics and e-mail.  She currently uses My Chart.

## 2018-12-24 ENCOUNTER — Ambulatory Visit: Payer: Medicaid Other | Admitting: Pulmonary Disease

## 2018-12-25 ENCOUNTER — Other Ambulatory Visit: Payer: Self-pay

## 2018-12-25 ENCOUNTER — Telehealth (INDEPENDENT_AMBULATORY_CARE_PROVIDER_SITE_OTHER): Payer: Medicaid Other | Admitting: Physician Assistant

## 2018-12-25 ENCOUNTER — Encounter: Payer: Self-pay | Admitting: Physician Assistant

## 2018-12-25 VITALS — BP 143/80 | HR 78 | Ht 59.0 in | Wt 201.0 lb

## 2018-12-25 DIAGNOSIS — R0789 Other chest pain: Secondary | ICD-10-CM

## 2018-12-25 DIAGNOSIS — Z7189 Other specified counseling: Secondary | ICD-10-CM

## 2018-12-25 DIAGNOSIS — I5189 Other ill-defined heart diseases: Secondary | ICD-10-CM

## 2018-12-25 DIAGNOSIS — I110 Hypertensive heart disease with heart failure: Secondary | ICD-10-CM | POA: Diagnosis not present

## 2018-12-25 MED ORDER — NITROGLYCERIN 0.4 MG SL SUBL: 0.4000 mg | SUBLINGUAL_TABLET | SUBLINGUAL | 3 refills | Status: AC | PRN

## 2018-12-25 NOTE — Progress Notes (Signed)
Virtual Visit via Telephone Note   This visit type was conducted due to national recommendations for restrictions regarding the COVID-19 Pandemic (e.g. social distancing) in an effort to limit this patient's exposure and mitigate transmission in our community.  Due to her co-morbid illnesses, this patient is at least at moderate risk for complications without adequate follow up.  This format is felt to be most appropriate for this patient at this time.  The patient did not have access to video technology/had technical difficulties with video requiring transitioning to audio format only (telephone).  All issues noted in this document were discussed and addressed.  No physical exam could be performed with this format.  Please refer to the patient's chart for her  consent to telehealth for [facility].   Evaluation Performed:  Follow-up visit  Date:  12/25/2018   ID:  Raven Turner, DOB [birth year], MRN [id number]  Patient Location: Home  Provider Location: Home  PCP:  Charlott Rakes, MD  Cardiologist:  Dorris Carnes, MD Chief Complaint:  Chest pain   History of Present Illness:    Raven Turner is a 48 y.o. female who presents via audio/video conferencing for a telehealth visit today.    The patient does not have symptoms concerning for COVID-19 infection (fever, chills, cough, or new shortness of breath).   Raven Turner with hx of DM, severe COPD/reactive airway dz, anemia, HTN, chronic diastolic CHF  and morbid obesity. Followed by Dr. Harrington Challenger with hx of chest pressure. Myoview in Aug 2016 showed no ischemia.  Last seen by Dr. Harrington Challenger 05/2018. She had chest tightness which felt most likely due to COPD.   Interpreter service used for 3 way phone call.  Patient is a poor historian which makes difficult to obtain detailed history.  Language barrier also on main concern.  She was changing topics during questions.  Patient has intermittent elevation of blood pressure  however states that "my doctor told me that my blood pressure machine is not working".  Patient also had episode of chest pain few weeks ago.  She was watching TV and had squeezing pressure pain.  Associated with shortness of breath without nausea or vomiting.  Lasted for unknown period of time.  Unsure if prior chest pain is similar to most recent episode.  Denies palpitation, orthopnea, PND or melena.  Past Medical History:  Diagnosis Date  . Anemia   . Asthma   . Bilateral hand numbness 06/25/2015  . Bone pain 02/27/2015   Diffuse bone pain in legs mostly but also in arms    . Breast pain, left 03/27/2015  . Callus of heel 12/16/2014  . Compression fracture   . Compression fracture of L1 lumbar vertebra (HCC) 08/26/2014  . Compression fracture of T12 vertebra ([place]) 08/28/2014  . COPD (chronic obstructive pulmonary disease) ([place]) 2013  . Cushing syndrome ([place]) 2013   . Depression   . Diabetes ([place]) 2013   . Diabetes mellitus type 2, uncontrolled ([place]) 08/26/2014   Sees Dr. Katy Fitch.  Last visit 11/27/14.  No diabetic retinopathy    . Diabetes mellitus with neuropathy ([place])   . Diastolic dysfunction without heart failure 10/14/2014   Grade 2 diastolic dysfunction 2/2 pulm HTN   . Generalized anxiety disorder 03/27/2015  . HTN (hypertension) 2005   . Hx of cataract surgery 11/03/2014  . LUQ abdominal pain 07/17/2015  . Medication management 05/12/2015  . Morbid obesity ([place]) 09/08/2014  . 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 Non-English speaking patient  09/08/2014  . OSA (obstructive sleep apnea) 05/20/2015  . Osteopenia 11/19/2013   Dx with osteopenia in lumbar vertebra with partial compression of L1  . Pain due to onychomycosis of toenail 03/27/2015  . Secondary Cushing's syndrome/ iatrogenic   . Skin rash 07/17/2015  . Tachycardia 01/13/2015  . Upper airway cough syndrome 09/05/2014   Followed in Pulmonary clinic/ [facility]/ [place]  - Trial off advair      09/05/2014 >>>  - trial off theoph  09/17/2014 and added flutter  - rec reduce dulera to 100 2 bid 07/13/2015     . Vision changes 06/25/2015  . Vitamin D deficiency 08/28/2014   Past Surgical History:  Procedure Laterality Date  . CATARACT EXTRACTION Bilateral 2014  . HAND SURGERY Right 12/18/2015  . VAGINAL HYSTERECTOMY       Current Meds  Medication Sig  . albuterol (PROVENTIL HFA;VENTOLIN HFA) 108 (90 Base) MCG/ACT inhaler Inhale 2 puffs into the lungs every 4 (four) hours as needed for wheezing or shortness of breath (((PLAN B))).  Marland Kitchen albuterol (PROVENTIL) (2.5 MG/3ML) 0.083% nebulizer solution USE 1 VIAL VIA NEBULIZER EVERY 4 HOURS AS NEEDED FOR WHEEZING OR SHORTNESS OF BREATH (Patient taking differently: Take 2.5 mg by nebulization every 4 (four) hours as needed for wheezing or shortness of breath. )  . aspirin EC 81 MG tablet Take 1 tablet (81 mg total) by mouth daily.  . BD INSULIN SYRINGE U-500 31G X 6MM 0.5 ML MISC U UTD TID SUBCUTANEOUSLY  . Blood Glucose Monitoring Suppl (ACCU-CHEK AVIVA PLUS) W/DEVICE KIT Used as directed  . BUTRANS 7.5 MCG/HR PTWK APP 1 PATCH ONTO THE SKIN ONCE A WEEK  . cabergoline (DOSTINEX) 0.5 MG tablet Take 0.5 tablets (0.25 mg total) by mouth 2 (two) times a week.  . calcium carbonate (CALCIUM 600) 600 MG TABS tablet Take 1 tablet by mouth daily.  . cholecalciferol (VITAMIN D) 1000 UNITS tablet Take 1,000 Units by mouth every morning.  . cloNIDine (CATAPRES) 0.2 MG tablet Take 0.2 mg by mouth 2 (two) times daily.  . DUPIXENT 300 MG/2ML SOSY Inject 1 pen/syringe subcutaneously every other week STARTING ON DAY 15  . EUCRISA 2 % OINT APP 1 APPLICATION AA BID PRN  . famotidine (PEPCID) 20 MG tablet Take by mouth.  . furosemide (LASIX) 20 MG tablet Take 1 tablet (20 mg total) by mouth every morning.  . gabapentin (NEURONTIN) 400 MG capsule Take 2 capsules (800 mg total) by mouth 3 (three) times daily.  Marland Kitchen glucose blood (RELION GLUCOSE TEST STRIPS) test strip 1 each by Other route 4 (four) times  daily. ICD code E11.65  . HUMULIN R U-500 KWIKPEN 500 UNIT/ML kwikpen Inject 120-180 Units into the skin 3 (three) times daily. 180 U before breakfast, 160 U before lunch, 140 U before dinner  . hydrOXYzine (ATARAX/VISTARIL) 25 MG tablet Take 25 mg by mouth at bedtime as needed.  . Insulin Pen Needle (B-D ULTRAFINE III SHORT PEN) 31G X 8 MM MISC 1 application by Does not apply route daily.  . Insulin Syringe-Needle U-100 (BD INSULIN SYRINGE ULTRAFINE) 31G X 15/64" 1 ML MISC 1 each by Does not apply route 3 (three) times daily.  Elmore Guise Devices (ACCU-CHEK SOFTCLIX) lancets Use as instructed  . levothyroxine (SYNTHROID, LEVOTHROID) 75 MCG tablet TK 1 T PO QD IN THE MORNING OES  . losartan (COZAAR) 100 MG tablet Take 1 tablet (100 mg total) by mouth every morning.  . methocarbamol (ROBAXIN) 750 MG tablet Take 1  tablet (750 mg total) by mouth 3 (three) times daily. MUST MAKE APPT FOR FURTHER REFILLS  . Misc. Devices (CANE) MISC 1 each by Does not apply route daily.  . mometasone-formoterol (DULERA) 200-5 MCG/ACT AERO Inhale 2 puffs into the lungs 2 (two) times daily.  . montelukast (SINGULAIR) 10 MG tablet Take 1 tablet (10 mg total) by mouth at bedtime.  Marland Kitchen NARCAN 4 MG/0.1ML LIQD nasal spray kit U UTS PRN  . omeprazole (PRILOSEC) 20 MG capsule Take 20 mg by mouth daily.  Marland Kitchen PARoxetine (PAXIL) 20 MG tablet Take 1 tablet (20 mg total) by mouth daily.  . predniSONE (DELTASONE) 1 MG tablet Take 2 tablets (2 mg total) by mouth daily.  . ReliOn Ultra Thin Lancets MISC 1 each by Does not apply route 4 (four) times daily. ICD code E11.65  . Respiratory Therapy Supplies (FLUTTER) DEVI Use as directed  . Spacer/Aero-Holding Chambers (AEROCHAMBER MV) inhaler Use as instructed  . spironolactone (ALDACTONE) 25 MG tablet Take 0.5 tablets (12.5 mg total) by mouth daily.  Marland Kitchen ULTICARE MICRO PEN NEEDLES 32G X 4 MM MISC USE AS DIRECTED  . verapamil (CALAN-SR) 180 MG CR tablet Take 2 tablets (360 mg total) by mouth at  bedtime.  . Vitamin D, Ergocalciferol, (DRISDOL) 1.25 MG (50000 UT) CAPS capsule Take by mouth.  . [DISCONTINUED] cloNIDine (CATAPRES) 0.2 MG tablet TAKE 1 TABLET(0.2 MG) BY MOUTH THREE TIMES DAILY (Patient taking differently: Take 0.2 mg by mouth 2 (two) times daily. )  . [DISCONTINUED] hydrOXYzine (ATARAX/VISTARIL) 25 MG tablet TAKE 1 TABLET BY MOUTH AT BEDTIME AS NEEDED FOR ANXIETY. (Patient taking differently: Take 25 mg by mouth at bedtime as needed for anxiety. )  . [DISCONTINUED] omeprazole (PRILOSEC) 20 MG capsule TAKE ONE CAPSULE BY MOUTH EVERY DAY BEFORE BREAKFAST (Patient taking differently: Take 20 mg by mouth daily. BEFORE BREAKFAST)   Current Facility-Administered Medications for the 12/25/18 encounter (Telemedicine) with Leanor Kail, PA  Medication  . betamethasone acetate-betamethasone sodium phosphate (CELESTONE) injection 3 mg  . umeclidinium bromide (INCRUSE ELLIPTA) 62.5 MCG/INH 1 puff     Allergies:   Shellfish allergy; Septra [sulfamethoxazole-trimethoprim]; and Hydrocortisone   Social History   Tobacco Use  . Smoking status: Never Smoker  . Smokeless tobacco: Never Used  Substance Use Topics  . Alcohol use: No    Alcohol/week: 0.0 standard drinks  . Drug use: No     Family Hx: The patient's family history includes Asthma in her father; Diabetes in her mother; Hypertension in her brother and sister; Stroke in her father.  ROS:   Please see the history of present illness.    All other systems reviewed and are negative.   Prior CV studies:   The following studies were reviewed today:  Stress test 04/2015  Nuclear stress EF: 58%.  There was no ST segment deviation noted during stress.  This is a low risk study.  The left ventricular ejection fraction is normal (55-65%).  There is apical thinning but no evidence of infarction or ischemia.  Echo 10/2014 - Left ventricle: The cavity size was normal. Wall thickness was increased in a pattern of  mild LVH. Systolic function was normal. The estimated ejection fraction was in the range of 50% to 55%. Wall motion was normal; there were no regional wall motion abnormalities. Features are consistent with a pseudonormal left ventricular filling pattern, with concomitant abnormal relaxation and increased filling pressure (grade 2 diastolic dysfunction). - Aortic valve: There was trivial regurgitation.  Labs/Other Tests and  Data Reviewed:    EKG:  No ECG reviewed.  Recent Labs: 09/06/2018: ALT 62; BUN 8; Creatinine, Ser 1.09; Potassium 4.0; Sodium 139 09/11/2018: Hemoglobin 12.1; Platelet Count 399   Recent Lipid Panel Lab Results  Component Value Date/Time   CHOL 130 02/23/2018 08:07 AM   TRIG 95 02/23/2018 08:07 AM   HDL 52 02/23/2018 08:07 AM   CHOLHDL 2.5 02/23/2018 08:07 AM   CHOLHDL 4 12/15/2014 10:51 AM   LDLCALC 59 02/23/2018 08:07 AM    Wt Readings from Last 3 Encounters:  12/25/18 201 lb (91.2 kg)  09/19/18 201 lb (91.2 kg)  09/11/18 199 lb 12.8 oz (90.6 kg)     Objective:    Vital Signs:  BP (!) 143/80   Pulse 78   Ht _0  (1.499 m)   Wt 201 lb (91.2 kg)   BMI 40.60 kg/m    Well nourished, well developed female in no acute distress.   ASSESSMENT & PLAN:    1. Hypertension with hypertensive heart disease -Patient reports transient elevation of blood pressure however states that her machine is not working properly.  During her chest pain episode seems her blood pressure was greater than 200.  Very difficult to obtain history.  I have advised her to get a new blood pressure machine (order online).  Record readings for at least 1 week and give [place] a call.  I have briefly discussed addition of new medication. Continue current medications.   2.  Chest pain -Occurred at rest.  Self resolved.  No recurrence.  Unable to tell if similar to prior episode or not.  PRN nitroglycerin.  Advised to monitor symptoms.  If worsen call EMS.  May require stress  test at some point.  Patient will benefit from office visit at follow-up ratherthan tele-visit due to language barrier and difficult to obtain history.  COVID-19 Education: The signs and symptoms of COVID-19 were discussed with the patient and how to seek care for testing (follow up with PCP or arrange E-visit).  The importance of social distancing was discussed today.  Time:   Today, I have spent 20-30 minutes with the patient with telehealth technology discussing the above problems.     Medication Adjustments/Labs and Tests Ordered: Current medicines are reviewed at length with the patient today.  Concerns regarding medicines are outlined above.   Tests Ordered: No orders of the defined types were placed in this encounter.   Medication Changes: Meds ordered this encounter  Medications  . nitroGLYCERIN (NITROSTAT) 0.4 MG SL tablet    Sig: Place 1 tablet (0.4 mg total) under the tongue every 5 (five) minutes as needed.    Dispense:  25 tablet    Refill:  3    Disposition:  Follow up in 6 week(s)  Signed, Leanor Kail, PA  12/25/2018 10:59 AM    [facility]

## 2018-12-25 NOTE — Patient Instructions (Signed)
Medication Instructions:  Your physician has recommended you make the following change in your medication:  1.  START Nitroglycerin 0.4 s/l tablet USE AS DIRECTED ON THE BOTTLE ONLY AS NEEDED FOR CHEST PAIN  If you need a refill on your cardiac medications before your next appointment, please call your pharmacy.   Lab work: None ordered  If you have labs (blood work) drawn today and your tests are completely normal, you will receive your results only by: Marland Kitchen MyChart Message (if you have MyChart) OR . A paper copy in the mail If you have any lab test that is abnormal or we need to change your treatment, we will call you to review the results.  Testing/Procedures: None ordered  Follow-Up: At [facility], you and your health needs are our priority.  As part of our continuing mission to provide you with exceptional heart care, we have created designated Provider Care Teams.  These Care Teams include your primary Cardiologist (physician) and Advanced Practice Providers (APPs -  Physician Assistants and Nurse Practitioners) who all work together to provide you with the care you need, when you need it. You will need a follow up appointment in:  6 weeks with  Dorris Carnes, MD   Any Other Special Instructions Will Be Listed Below (If Applicable).

## 2019-01-08 ENCOUNTER — Telehealth: Payer: Self-pay | Admitting: Pulmonary Disease

## 2019-01-08 ENCOUNTER — Other Ambulatory Visit: Payer: Self-pay | Admitting: Family Medicine

## 2019-01-08 DIAGNOSIS — F411 Generalized anxiety disorder: Secondary | ICD-10-CM

## 2019-01-08 MED ORDER — PREDNISONE 1 MG PO TABS: 2.0000 mg | ORAL_TABLET | Freq: Every day | ORAL | 2 refills | Status: DC

## 2019-01-08 NOTE — Telephone Encounter (Signed)
Call returned to patient, confirmed medication and pharmacy. Refill sent. Nothing further needed at this time.

## 2019-01-09 ENCOUNTER — Ambulatory Visit: Payer: Medicaid Other | Admitting: Pulmonary Disease

## 2019-03-07 DIAGNOSIS — M7989 Other specified soft tissue disorders: Secondary | ICD-10-CM | POA: Insufficient documentation

## 2019-03-07 DIAGNOSIS — M1811 Unilateral primary osteoarthritis of first carpometacarpal joint, right hand: Secondary | ICD-10-CM | POA: Insufficient documentation

## 2019-03-11 NOTE — Progress Notes (Addendum)
[place] OFFICE PROGRESS NOTE  Patient Care Team: Raven Rakes, MD as PCP - General (Family Medicine) Raven Records, MD as PCP - Cardiology (Cardiology)  HEME/ONC OVERVIEW: 1. Iron deficiency anemia secondary to menorrhagia   TREATMENT REGIMEN:  PRN IV iron, last in 06/2018   ASSESSMENT & PLAN:   Iron deficiency anemia -Secondary to blood loss from menorrhagia -Hgb 12.3; iron profile pending  -No indication for IV iron today, given the normal Hgb  -I recommended the patient to start OTC iron supplement once a day or once every other day; I counseled her on some of the side effects, including constipation, for which she can take Miralax and stool softener as needed  -I counseled the patient on importance of age-appropriate cancer screening, including colonoscopy  Leukocytosis -WBC 11.3k today with normal differentials -Review of the patient's labs showed mild intermittent leukocytosis -Given the patient's extensive osteoarthritis, this is most likely reactive -Patient denies any symptoms of infection -We will monitor it for now  Thrombocytosis -Plts 411k today; patient has had mild intermittent leukocytosis, most likely reactive in the setting of osteoarthritis as well as mild iron deficiency -See iron supplement as above -We will monitor it for now  Menorrhagia -Overall improving since being treated for hypothyroidism -Continue follow-up with PCP   Orders Placed This Encounter  Procedures  . CBC with Differential (Cancer Center Only)    Standing Status:   Future    Standing Expiration Date:   04/16/2020  . Save Smear (SSMR)    Standing Status:   Future    Standing Expiration Date:   03/12/2020  . Ferritin    Standing Status:   Future    Standing Expiration Date:   04/16/2020  . Iron and TIBC    Standing Status:   Future    Standing Expiration Date:   04/16/2020   All questions were answered. The patient knows to call the clinic with any problems,  questions or concerns. No barriers to learning was detected.  Return in 6 months for labs and clinic appt with APP.  Tish Men, MD 03/13/2019 11:56 AM  CHIEF COMPLAINT: "I am okay"  INTERVAL HISTORY: [name] returns to clinic for follow-up of iron deficiency anemia.  Patient reports that since last visit, she was diagnosed with hypothyroidism.  Since starting Synthroid, her menstrual bleeding has improved significantly.  She still has regular menstrual cycles, most recently in early 02/2019, lasting 6 to 7 days and slightly heavy during the first 3 days.  She otherwise denies any other symptoms of bleeding, such as hematemesis, hemoptysis, hematochezia, melena, or hematuria.  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  She is not currently on a oral iron supplement.  She denies any other complaint today.  REVIEW OF SYSTEMS:   Constitutional: ( - ) fevers, ( - )  chills , ( - ) night sweats Eyes: ( - ) blurriness of vision, ( - ) double vision, ( - ) watery eyes Ears, nose, mouth, throat, and face: ( - ) mucositis, ( - ) sore throat Respiratory: ( - ) cough, ( - ) dyspnea, ( - ) wheezes Cardiovascular: ( - ) palpitation, ( - ) chest discomfort, ( - ) lower extremity swelling Gastrointestinal:  ( - ) nausea, ( - ) heartburn, ( - ) change in bowel habits Skin: ( - ) abnormal skin rashes Lymphatics: ( - ) new lymphadenopathy, ( - )  easy bruising Neurological: ( - ) numbness, ( - ) tingling, ( - ) new weaknesses Behavioral/Psych: ( - ) mood change, ( - ) new changes  All other systems were reviewed with the patient and are negative.  I have reviewed the past medical history, past surgical history, social history and family history with the patient and they are unchanged from previous note.  ALLERGIES:  is allergic to shellfish allergy; septra [sulfamethoxazole-trimethoprim]; and  hydrocortisone.  MEDICATIONS:  Current Outpatient Medications  Medication Sig Dispense Refill  . traZODone (DESYREL) 50 MG tablet Take 50 mg by mouth at bedtime.    Marland Kitchen albuterol (PROVENTIL HFA;VENTOLIN HFA) 108 (90 Base) MCG/ACT inhaler Inhale 2 puffs into the lungs every 4 (four) hours as needed for wheezing or shortness of breath (((PLAN B))). 18 g 6  . albuterol (PROVENTIL) (2.5 MG/3ML) 0.083% nebulizer solution USE 1 VIAL VIA NEBULIZER EVERY 4 HOURS AS NEEDED FOR WHEEZING OR SHORTNESS OF BREATH (Patient taking differently: Take 2.5 mg by nebulization every 4 (four) hours as needed for wheezing or shortness of breath. ) 90 mL 0  . aspirin EC 81 MG tablet Take 1 tablet (81 mg total) by mouth daily. 90 tablet 3  . BD INSULIN SYRINGE U-500 31G X 6MM 0.5 ML MISC U UTD TID SUBCUTANEOUSLY    . Blood Glucose Monitoring Suppl (ACCU-CHEK AVIVA PLUS) W/DEVICE KIT Used as directed 1 kit 0  . BUTRANS 7.5 MCG/HR PTWK APP 1 PATCH ONTO THE SKIN ONCE A WEEK    . cabergoline (DOSTINEX) 0.5 MG tablet Take 0.5 tablets (0.25 mg total) by mouth 2 (two) times a week. 10 tablet 3  . calcium carbonate (CALCIUM 600) 600 MG TABS tablet Take 1 tablet by mouth daily.    . cholecalciferol (VITAMIN D) 1000 UNITS tablet Take 1,000 Units by mouth every morning.    . cloNIDine (CATAPRES) 0.2 MG tablet Take 0.2 mg by mouth 2 (two) times daily.    . DUPIXENT 300 MG/2ML SOSY Inject 1 pen/syringe subcutaneously every other week STARTING ON DAY 15 4 mL 5  . EUCRISA 2 % OINT APP 1 APPLICATION AA BID PRN    . famotidine (PEPCID) 20 MG tablet Take by mouth.    . furosemide (LASIX) 20 MG tablet Take 1 tablet (20 mg total) by mouth every morning. 30 tablet 2  . gabapentin (NEURONTIN) 400 MG capsule Take 2 capsules (800 mg total) by mouth 3 (three) times daily. 180 capsule 3  . glucose blood (RELION GLUCOSE TEST STRIPS) test strip 1 each by Other route 4 (four) times daily. ICD code E11.65 400 each 3  . HUMULIN R U-500 KWIKPEN 500  UNIT/ML kwikpen Inject 120-180 Units into the skin 3 (three) times daily. 180 U before breakfast, 160 U before lunch, 140 U before dinner 27 mL 11  . hydrOXYzine (ATARAX/VISTARIL) 25 MG tablet Take 25 mg by mouth at bedtime as needed.    . Insulin Pen Needle (B-D ULTRAFINE III SHORT PEN) 31G X 8 MM MISC 1 application by Does not apply route daily. 100 each 3  . Insulin Syringe-Needle U-100 (BD INSULIN SYRINGE ULTRAFINE) 31G X 15/64" 1 ML MISC 1 each by Does not apply route 3 (three) times daily. 300 each 3  . Lancet Devices (ACCU-CHEK SOFTCLIX) lancets Use as instructed 1 each 0  . levothyroxine (SYNTHROID, LEVOTHROID) 75 MCG tablet TK 1 T PO QD IN THE MORNING OES    . losartan (COZAAR) 100 MG tablet Take 1 tablet (100 mg  total) by mouth every morning. 30 tablet 6  . methocarbamol (ROBAXIN) 750 MG tablet Take 1 tablet (750 mg total) by mouth 3 (three) times daily. MUST MAKE APPT FOR FURTHER REFILLS 60 tablet 0  . Misc. Devices (CANE) MISC 1 each by Does not apply route daily. 1 each 0  . mometasone-formoterol (DULERA) 200-5 MCG/ACT AERO Inhale 2 puffs into the lungs 2 (two) times daily. 13 g 3  . montelukast (SINGULAIR) 10 MG tablet Take 1 tablet (10 mg total) by mouth at bedtime. 30 tablet 6  . NARCAN 4 MG/0.1ML LIQD nasal spray kit U UTS PRN    . nitroGLYCERIN (NITROSTAT) 0.4 MG SL tablet Place 1 tablet (0.4 mg total) under the tongue every 5 (five) minutes as needed. 25 tablet 3  . omeprazole (PRILOSEC) 20 MG capsule Take 20 mg by mouth daily.    Marland Kitchen PARoxetine (PAXIL) 20 MG tablet Take 1 tablet (20 mg total) by mouth daily. MUST MAKE APPT FOR FURTHER REFILLS 30 tablet 1  . predniSONE (DELTASONE) 1 MG tablet Take 2 tablets (2 mg total) by mouth daily. 60 tablet 2  . ReliOn Ultra Thin Lancets MISC 1 each by Does not apply route 4 (four) times daily. ICD code E11.65 400 each 3  . Respiratory Therapy Supplies (FLUTTER) DEVI Use as directed 1 each 0  . Spacer/Aero-Holding Chambers (AEROCHAMBER MV)  inhaler Use as instructed 1 each 0  . ULTICARE MICRO PEN NEEDLES 32G X 4 MM MISC USE AS DIRECTED 100 each 2  . verapamil (CALAN-SR) 180 MG CR tablet Take 2 tablets (360 mg total) by mouth at bedtime. 180 tablet 2  . Vitamin D, Ergocalciferol, (DRISDOL) 1.25 MG (50000 UT) CAPS capsule Take by mouth.     Current Facility-Administered Medications  Medication Dose Route Frequency Provider Last Rate Last Dose  . betamethasone acetate-betamethasone sodium phosphate (CELESTONE) injection 3 mg  3 mg Intramuscular Once Evans, Brent M, DPM      . umeclidinium bromide (INCRUSE ELLIPTA) 62.5 MCG/INH 1 puff  1 puff Inhalation Daily Ladell Pier, MD       Facility-Administered Medications Ordered in Other Visits  Medication Dose Route Frequency Provider Last Rate Last Dose  . DOBUTamine (DOBUTREX) 1,000 mcg/mL in dextrose 5% 250 mL infusion  30 mcg/kg/min Intravenous Continuous Croitoru, Mihai, MD 145.3 mL/hr at 04/22/15 1300 30 mcg/kg/min at 04/22/15 1300    PHYSICAL EXAMINATION: ECOG PERFORMANCE STATUS: 1 - Symptomatic but completely ambulatory  Today's Vitals   03/13/19 1101 03/13/19 1106  BP: 128/62   Pulse: 81   Resp: 18   Temp: 100 F (37.8 C)   TempSrc: Oral   SpO2: 99%   Weight: 199 lb 4.8 oz (90.4 kg)   Height: '4\' 11"'$  (1.499 m)   PainSc:  0-No pain   Body mass index is 40.25 kg/m.  Filed Weights   03/13/19 1101  Weight: 199 lb 4.8 oz (90.4 kg)    GENERAL: alert, no distress and comfortable, obese, uses a cane for support  SKIN: skin color, texture, turgor are normal, no rashes or significant lesions EYES: conjunctiva are pink and non-injected, sclera clear OROPHARYNX: no exudate, no erythema; lips, buccal mucosa, and tongue normal  NECK: supple, non-tender LUNGS: clear to auscultation with normal breathing effort HEART: regular rate & rhythm and no murmurs and no lower extremity edema ABDOMEN: soft, non-tender, non-distended, normal bowel sounds Musculoskeletal: no  cyanosis of digits and no clubbing  PSYCH: alert & oriented x 3, fluent speech NEURO: no  focal motor/sensory deficits  LABORATORY DATA:  I have reviewed the data as listed    Component Value Date/Time   NA 139 09/06/2018 1749   NA 138 02/09/2018 1121   K 4.0 09/06/2018 1749   CL 102 09/06/2018 1749   CO2 30 09/06/2018 1749   GLUCOSE 130 (H) 09/06/2018 1749   BUN 8 09/06/2018 1749   BUN 17 02/09/2018 1121   CREATININE 1.09 (H) 09/06/2018 1749   CREATININE 1.09 (H) 07/23/2018 1026   CREATININE 0.84 11/07/2016 1641   CALCIUM 9.4 09/06/2018 1749   PROT 8.0 09/06/2018 1749   PROT 7.3 03/27/2017 1520   ALBUMIN 3.7 09/06/2018 1749   ALBUMIN 3.9 03/27/2017 1520   AST 53 (H) 09/06/2018 1749   ALT 62 (H) 09/06/2018 1749   ALKPHOS 86 09/06/2018 1749   BILITOT 0.6 09/06/2018 1749   BILITOT <0.2 03/27/2017 1520   GFRNONAA >60 09/06/2018 1749   GFRNONAA 60 (L) 07/23/2018 1026   GFRNONAA 85 11/07/2016 1641   GFRAA >60 09/06/2018 1749   GFRAA >60 07/23/2018 1026   GFRAA >89 11/07/2016 1641    No results found for: SPEP, UPEP  Lab Results  Component Value Date   WBC 11.3 (H) 03/13/2019   NEUTROABS 6.0 03/13/2019   HGB 12.3 03/13/2019   HCT 38.4 03/13/2019   MCV 85.5 03/13/2019   PLT 411 (H) 03/13/2019      Chemistry      Component Value Date/Time   NA 139 09/06/2018 1749   NA 138 02/09/2018 1121   K 4.0 09/06/2018 1749   CL 102 09/06/2018 1749   CO2 30 09/06/2018 1749   BUN 8 09/06/2018 1749   BUN 17 02/09/2018 1121   CREATININE 1.09 (H) 09/06/2018 1749   CREATININE 1.09 (H) 07/23/2018 1026   CREATININE 0.84 11/07/2016 1641      Component Value Date/Time   CALCIUM 9.4 09/06/2018 1749   ALKPHOS 86 09/06/2018 1749   AST 53 (H) 09/06/2018 1749   ALT 62 (H) 09/06/2018 1749   BILITOT 0.6 09/06/2018 1749   BILITOT <0.2 03/27/2017 1520

## 2019-03-13 ENCOUNTER — Inpatient Hospital Stay: Payer: Medicaid Other | Attending: Hematology | Admitting: Hematology

## 2019-03-13 ENCOUNTER — Encounter: Payer: Self-pay | Admitting: Hematology

## 2019-03-13 ENCOUNTER — Inpatient Hospital Stay: Payer: Medicaid Other

## 2019-03-13 ENCOUNTER — Other Ambulatory Visit: Payer: Self-pay

## 2019-03-13 VITALS — BP 128/62 | HR 81 | Temp 100.0°F | Resp 18 | Ht 59.0 in | Wt 199.3 lb

## 2019-03-13 DIAGNOSIS — D72829 Elevated white blood cell count, unspecified: Secondary | ICD-10-CM

## 2019-03-13 DIAGNOSIS — D500 Iron deficiency anemia secondary to blood loss (chronic): Secondary | ICD-10-CM

## 2019-03-13 DIAGNOSIS — N920 Excessive and frequent menstruation with regular cycle: Secondary | ICD-10-CM

## 2019-03-13 DIAGNOSIS — R7989 Other specified abnormal findings of blood chemistry: Secondary | ICD-10-CM | POA: Diagnosis not present

## 2019-03-13 DIAGNOSIS — Z79899 Other long term (current) drug therapy: Secondary | ICD-10-CM | POA: Insufficient documentation

## 2019-03-13 DIAGNOSIS — Z7982 Long term (current) use of aspirin: Secondary | ICD-10-CM | POA: Diagnosis not present

## 2019-03-13 DIAGNOSIS — D473 Essential (hemorrhagic) thrombocythemia: Secondary | ICD-10-CM

## 2019-03-13 DIAGNOSIS — E039 Hypothyroidism, unspecified: Secondary | ICD-10-CM | POA: Diagnosis not present

## 2019-03-13 DIAGNOSIS — M1990 Unspecified osteoarthritis, unspecified site: Secondary | ICD-10-CM | POA: Insufficient documentation

## 2019-03-13 DIAGNOSIS — D75839 Thrombocytosis, unspecified: Secondary | ICD-10-CM

## 2019-03-13 LAB — CBC WITH DIFFERENTIAL (CANCER CENTER ONLY)
Abs Immature Granulocytes: 0.07 10*3/uL (ref 0.00–0.07)
Basophils Absolute: 0.1 10*3/uL (ref 0.0–0.1)
Basophils Relative: 1 %
Eosinophils Absolute: 0.4 10*3/uL (ref 0.0–0.5)
Eosinophils Relative: 3 %
HCT: 38.4 % (ref 36.0–46.0)
Hemoglobin: 12.3 g/dL (ref 12.0–15.0)
Immature Granulocytes: 1 %
Lymphocytes Relative: 37 %
Lymphs Abs: 4.2 10*3/uL — ABNORMAL HIGH (ref 0.7–4.0)
MCH: 27.4 pg (ref 26.0–34.0)
MCHC: 32 g/dL (ref 30.0–36.0)
MCV: 85.5 fL (ref 80.0–100.0)
Monocytes Absolute: 0.6 10*3/uL (ref 0.1–1.0)
Monocytes Relative: 6 %
Neutro Abs: 6 10*3/uL (ref 1.7–7.7)
Neutrophils Relative %: 52 %
Platelet Count: 411 10*3/uL — ABNORMAL HIGH (ref 150–400)
RBC: 4.49 MIL/uL (ref 3.87–5.11)
RDW: 15.3 % (ref 11.5–15.5)
WBC Count: 11.3 10*3/uL — ABNORMAL HIGH (ref 4.0–10.5)
nRBC: 0 % (ref 0.0–0.2)

## 2019-03-13 LAB — IRON AND TIBC
Iron: 41 ug/dL (ref 41–142)
Saturation Ratios: 13 % — ABNORMAL LOW (ref 21–57)
TIBC: 308 ug/dL (ref 236–444)
UIBC: 266 ug/dL (ref 120–384)

## 2019-03-13 LAB — FERRITIN: Ferritin: 53 ng/mL (ref 11–307)

## 2019-03-13 NOTE — Addendum Note (Signed)
Addended by: Tish Men on: 03/13/2019 11:56 AM   Modules accepted: Orders

## 2019-03-14 ENCOUNTER — Telehealth: Payer: Self-pay | Admitting: Hematology

## 2019-03-14 NOTE — Telephone Encounter (Signed)
Scheduled per los. Mailed printout  °

## 2019-03-15 ENCOUNTER — Other Ambulatory Visit: Payer: Self-pay | Admitting: Pulmonary Disease

## 2019-03-15 DIAGNOSIS — J4550 Severe persistent asthma, uncomplicated: Secondary | ICD-10-CM

## 2019-04-01 ENCOUNTER — Other Ambulatory Visit: Payer: Self-pay

## 2019-04-12 ENCOUNTER — Telehealth: Payer: Self-pay

## 2019-04-12 NOTE — Telephone Encounter (Signed)

## 2019-04-13 NOTE — Progress Notes (Addendum)
Cardiology Office Note   Date:  04/15/2019   ID:  Raven Turner, Raven Turner [birth year], MRN [id number]  PCP:  Charlott Rakes, MD  Cardiologist:   Dorris Carnes, MD   F/U of HTN and CAD    History of Present Illness: Raven Turner is a 48 y.o. female with a history of HTN, DM, COPD (severe), morbid obesity and chest pressure.  Hx of coronary calcifications on CT (LAD)  Myovue in 2016 showed no ischemia I saw her in clinic in f/ollow up in September 2019 She was seen by B Bhagat in April 2020  Phone visit    BP intermitt elevated     Since seen she her breathing has been OK   Denies CP    Pt says she had a carotid USN at [place] [facility] and was told she had signif narrowing                                                                          Current Meds  Medication Sig  . albuterol (PROVENTIL HFA;VENTOLIN HFA) 108 (90 Base) MCG/ACT inhaler Inhale 2 puffs into the lungs every 4 (four) hours as needed for wheezing or shortness of breath (((PLAN B))).  Marland Kitchen albuterol (PROVENTIL) (2.5 MG/3ML) 0.083% nebulizer solution USE 1 VIAL VIA NEBULIZER EVERY 4 HOURS AS NEEDED FOR WHEEZING OR SHORTNESS OF BREATH (Patient taking differently: Take 2.5 mg by nebulization every 4 (four) hours as needed for wheezing or shortness of breath. )  . aspirin EC 81 MG tablet Take 1 tablet (81 mg total) by mouth daily.  . BD INSULIN SYRINGE U-500 31G X 6MM 0.5 ML MISC U UTD TID SUBCUTANEOUSLY  . Blood Glucose Monitoring Suppl (ACCU-CHEK AVIVA PLUS) W/DEVICE KIT Used as directed  . BUTRANS 7.5 MCG/HR PTWK 15 mcg.   . cabergoline (DOSTINEX) 0.5 MG tablet Take 0.5 tablets (0.25 mg total) by mouth 2 (two) times a week.  . calcium carbonate (CALCIUM 600) 600 MG TABS tablet Take 1 tablet by mouth daily.  . cholecalciferol (VITAMIN D) 1000 UNITS tablet Take 1,000 Units by mouth every morning.  . cloNIDine (CATAPRES) 0.2 MG tablet Take 0.2 mg by mouth 2 (two) times daily.  . DUPIXENT 300 MG/2ML  prefilled syringe Inject 1 pen/syringe subcutaneously every other week  . EUCRISA 2 % OINT APP 1 APPLICATION AA BID PRN  . famotidine (PEPCID) 20 MG tablet Take by mouth.  . furosemide (LASIX) 20 MG tablet Take 1 tablet (20 mg total) by mouth every morning.  . gabapentin (NEURONTIN) 400 MG capsule Take 2 capsules (800 mg total) by mouth 3 (three) times daily.  Marland Kitchen glucose blood (RELION GLUCOSE TEST STRIPS) test strip 1 each by Other route 4 (four) times daily. ICD code E11.65  . HUMULIN R U-500 KWIKPEN 500 UNIT/ML kwikpen Inject 120-180 Units into the skin 3 (three) times daily. 180 U before breakfast, 160 U before lunch, 140 U before dinner  . hydrOXYzine (ATARAX/VISTARIL) 25 MG tablet Take 25 mg by mouth at bedtime as needed.  . Insulin Pen Needle (B-D ULTRAFINE III SHORT PEN) 31G X 8 MM MISC 1 application by Does not apply route daily.  . Insulin Syringe-Needle U-100 (BD INSULIN SYRINGE ULTRAFINE)  31G X 15/64" 1 ML MISC 1 each by Does not apply route 3 (three) times daily.  Elmore Guise Devices (ACCU-CHEK SOFTCLIX) lancets Use as instructed  . levothyroxine (SYNTHROID, LEVOTHROID) 75 MCG tablet TK 1 T PO QD IN THE MORNING OES  . losartan (COZAAR) 100 MG tablet Take 1 tablet (100 mg total) by mouth every morning.  . methocarbamol (ROBAXIN) 750 MG tablet Take 1 tablet (750 mg total) by mouth 3 (three) times daily. MUST MAKE APPT FOR FURTHER REFILLS  . Misc. Devices (CANE) MISC 1 each by Does not apply route daily.  . mometasone-formoterol (DULERA) 200-5 MCG/ACT AERO Inhale 2 puffs into the lungs 2 (two) times daily.  . montelukast (SINGULAIR) 10 MG tablet Take 1 tablet (10 mg total) by mouth at bedtime.  Marland Kitchen NARCAN 4 MG/0.1ML LIQD nasal spray kit U UTS PRN  . omeprazole (PRILOSEC) 20 MG capsule Take 20 mg by mouth daily.  Marland Kitchen PARoxetine (PAXIL) 20 MG tablet Take 1 tablet (20 mg total) by mouth daily. MUST MAKE APPT FOR FURTHER REFILLS  . predniSONE (DELTASONE) 1 MG tablet Take 2 tablets (2 mg total) by  mouth daily.  . ReliOn Ultra Thin Lancets MISC 1 each by Does not apply route 4 (four) times daily. ICD code E11.65  . Respiratory Therapy Supplies (FLUTTER) DEVI Use as directed  . Spacer/Aero-Holding Chambers (AEROCHAMBER MV) inhaler Use as instructed  . traZODone (DESYREL) 50 MG tablet Take 50 mg by mouth at bedtime.  Marland Kitchen ULTICARE MICRO PEN NEEDLES 32G X 4 MM MISC USE AS DIRECTED  . verapamil (CALAN-SR) 180 MG CR tablet Take 2 tablets (360 mg total) by mouth at bedtime.  . Vitamin D, Ergocalciferol, (DRISDOL) 1.25 MG (50000 UT) CAPS capsule Take by mouth.   Current Facility-Administered Medications for the 04/15/19 encounter (Office Visit) with Fay Records, MD  Medication  . betamethasone acetate-betamethasone sodium phosphate (CELESTONE) injection 3 mg  . umeclidinium bromide (INCRUSE ELLIPTA) 62.5 MCG/INH 1 puff     Allergies:   Hydrocortisone, Shellfish allergy, and Sulfamethoxazole-trimethoprim   Past Medical History:  Diagnosis Date  . Anemia   . Asthma   . Bilateral hand numbness 06/25/2015  . Bone pain 02/27/2015   Diffuse bone pain in legs mostly but also in arms    . Breast pain, left 03/27/2015  . Callus of heel 12/16/2014  . Compression fracture   . Compression fracture of L1 lumbar vertebra (HCC) 08/26/2014  . Compression fracture of T12 vertebra ([place]) 08/28/2014  . COPD (chronic obstructive pulmonary disease) ([place]) 2013  . Cushing syndrome ([place]) 2013   . Depression   . Diabetes ([place]) 2013   . Diabetes mellitus type 2, uncontrolled ([place]) 08/26/2014   Sees Dr. Katy Fitch.  Last visit 11/27/14.  No diabetic retinopathy    . Diabetes mellitus with neuropathy ([place])   . Diastolic dysfunction without heart failure 10/14/2014   Grade 2 diastolic dysfunction 2/2 pulm HTN   . Generalized anxiety disorder 03/27/2015  . HTN (hypertension) 2005   . Hx of cataract surgery 11/03/2014  . LUQ abdominal pain 07/17/2015  . Medication management 05/12/2015  . Morbid obesity ([place]) 09/08/2014  .  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 Non-English speaking patient 09/08/2014  . OSA (obstructive sleep apnea) 05/20/2015  . Osteopenia 11/19/2013   Dx with osteopenia in lumbar vertebra with partial compression of L1  . Pain due to onychomycosis of toenail 03/27/2015  . Secondary Cushing's syndrome/ iatrogenic   . Skin rash 07/17/2015  . Tachycardia 01/13/2015  .  Upper airway cough syndrome 09/05/2014   Followed in Pulmonary clinic/ [facility]/ [place]  - Trial off advair      09/05/2014 >>>  - trial off theoph 09/17/2014 and added flutter  - rec reduce dulera to 100 2 bid 07/13/2015     . Vision changes 06/25/2015  . Vitamin D deficiency 08/28/2014    Past Surgical History:  Procedure Laterality Date  . CATARACT EXTRACTION Bilateral 2014  . HAND SURGERY Right 12/18/2015  . VAGINAL HYSTERECTOMY       Social History:  The patient  reports that she has never smoked. She has never used smokeless tobacco. She reports that she does not drink alcohol or use drugs.   Family History:  The patient's family history includes Asthma in her father; Diabetes in her mother; Hypertension in her brother and sister; Stroke in her father.    ROS:  Please see the history of present illness. All other systems are reviewed and  Negative to the above problem except as noted.    PHYSICAL EXAM: VS:  BP 129/78   Pulse 89   Ht 4' 11" (1.499 m)   Wt 204 lb 6.4 oz (92.7 kg)   BMI 41.28 kg/m   GEN: Morbidly obese 47 yo in no acute distress  Neck: JVP normal  NO, carotid bruits, or masses Cardiac: RRR; no murmurs, rubs, or gallops,no edema  Respiratory: Mild exp wheeze GI: soft, nontender  Skin: warm and dry, no rash Neuro:  Strength and sensation are intact Psych: euthymic mood, full affect   EKG:  EKG is not  ordered today.   Stress test 04/2015  Nuclear stress EF: 58%.  There was no ST segment deviation noted during stress.  This is a low risk study.  The left ventricular ejection fraction is  normal (55-65%).  There is apical thinning but no evidence of infarction or ischemia.  Echo 10/2014 - Left ventricle: The cavity size was normal. Wall thickness was increased in a pattern of mild LVH. Systolic function was normal. The estimated ejection fraction was in the range of 50% to 55%. Wall motion was normal; there were no regional wall motion abnormalities. Features are consistent with a pseudonormal left ventricular filling pattern, with concomitant abnormal relaxation and increased filling pressure (grade 2 diastolic dysfunction). - Aortic valve: There was trivial regurgitation. . Lipid Panel    Component Value Date/Time   CHOL 130 02/23/2018 0807   TRIG 95 02/23/2018 0807   HDL 52 02/23/2018 0807   CHOLHDL 2.5 02/23/2018 0807   CHOLHDL 4 12/15/2014 1051   VLDL 38.4 12/15/2014 1051   LDLCALC 59 02/23/2018 0807      Wt Readings from Last 3 Encounters:  04/15/19 204 lb 6.4 oz (92.7 kg)  03/13/19 199 lb 4.8 oz (90.4 kg)  12/25/18 201 lb (91.2 kg)      ASSESSMENT AND PLAN:  1  Chest pressure Patient denies symptoms    2  CAD   Coronary calcifications on CT scan  No symptoms to sugg angina.  3   COPD   Moving air OK, mild wheezing    4  HTN  BP is controlled   5  PV dz   Will get records from [place] to review  Current medicines are reviewed at length with the patient today.  The patient does not have concerns regarding medicines.  Signed, Paula Ross, MD  04/15/2019 11:57 AM    [facility] [address], [place], [place]    [address] Phone: [phone]; Fax: [phone]    

## 2019-04-15 ENCOUNTER — Encounter: Payer: Self-pay | Admitting: Internal Medicine

## 2019-04-15 ENCOUNTER — Ambulatory Visit (INDEPENDENT_AMBULATORY_CARE_PROVIDER_SITE_OTHER): Payer: Medicaid Other | Admitting: Internal Medicine

## 2019-04-15 ENCOUNTER — Other Ambulatory Visit: Payer: Self-pay

## 2019-04-15 VITALS — BP 129/78 | HR 89 | Ht 59.0 in | Wt 204.4 lb

## 2019-04-15 DIAGNOSIS — I5189 Other ill-defined heart diseases: Secondary | ICD-10-CM

## 2019-04-15 DIAGNOSIS — I2510 Atherosclerotic heart disease of native coronary artery without angina pectoris: Secondary | ICD-10-CM

## 2019-04-15 DIAGNOSIS — E785 Hyperlipidemia, unspecified: Secondary | ICD-10-CM

## 2019-04-15 DIAGNOSIS — E11649 Type 2 diabetes mellitus with hypoglycemia without coma: Secondary | ICD-10-CM

## 2019-04-15 NOTE — Patient Instructions (Signed)
Medication Instructions:  Your physician recommends that you continue on your current medications as directed. Please refer to the Current Medication list given to you today.  If you need a refill on your cardiac medications before your next appointment, please call your pharmacy.   Lab work: CBC. BMET, Lipid and A1C today  If you have labs (blood work) drawn today and your tests are completely normal, you will receive your results only by: Marland Kitchen MyChart Message (if you have MyChart) OR . A paper copy in the mail If you have any lab test that is abnormal or we need to change your treatment, we will call you to review the results.  Testing/Procedures: None  Follow-Up: At [facility], you and your health needs are our priority.  As part of our continuing mission to provide you with exceptional heart care, we have created designated Provider Care Teams.  These Care Teams include your primary Cardiologist (physician) and Advanced Practice Providers (APPs -  Physician Assistants and Nurse Practitioners) who all work together to provide you with the care you need, when you need it. You will need a follow up appointment in:  6 months.  Please call our office 2 months in advance to schedule this appointment.  You may see Dorris Carnes, MD or one of the following Advanced Practice Providers on your designated Care Team: Richardson Dopp, PA-C [place], [place] . Daune Perch, NP  Any Other Special Instructions Will Be Listed Below (If Applicable).

## 2019-04-16 LAB — HEMOGLOBIN A1C
Est. average glucose Bld gHb Est-mCnc: 186 mg/dL
Hgb A1c MFr Bld: 8.1 % — ABNORMAL HIGH (ref 4.8–5.6)

## 2019-04-16 LAB — BASIC METABOLIC PANEL
BUN/Creatinine Ratio: 10 (ref 9–23)
BUN: 9 mg/dL (ref 6–24)
CO2: 27 mmol/L (ref 20–29)
Calcium: 8.9 mg/dL (ref 8.7–10.2)
Chloride: 100 mmol/L (ref 96–106)
Creatinine, Ser: 0.88 mg/dL (ref 0.57–1.00)
GFR calc Af Amer: 91 mL/min/{1.73_m2} (ref >59)
GFR calc non Af Amer: 79 mL/min/{1.73_m2} (ref >59)
Glucose: 100 mg/dL — ABNORMAL HIGH (ref 65–99)
Potassium: 4.2 mmol/L (ref 3.5–5.2)
Sodium: 142 mmol/L (ref 134–144)

## 2019-04-16 LAB — CBC
Hematocrit: 36.4 % (ref 34.0–46.6)
Hemoglobin: 11.8 g/dL (ref 11.1–15.9)
MCH: 27.1 pg (ref 26.6–33.0)
MCHC: 32.4 g/dL (ref 31.5–35.7)
MCV: 84 fL (ref 79–97)
Platelets: 451 10*3/uL — ABNORMAL HIGH (ref 150–450)
RBC: 4.36 x10E6/uL (ref 3.77–5.28)
RDW: 14.5 % (ref 11.7–15.4)
WBC: 12.1 10*3/uL — ABNORMAL HIGH (ref 3.4–10.8)

## 2019-04-16 LAB — LIPID PANEL
Chol/HDL Ratio: 3.6 ratio (ref 0.0–4.4)
Cholesterol, Total: 176 mg/dL (ref 100–199)
HDL: 49 mg/dL (ref >39)
LDL Calculated: 108 mg/dL — ABNORMAL HIGH (ref 0–99)
Triglycerides: 93 mg/dL (ref 0–149)
VLDL Cholesterol Cal: 19 mg/dL (ref 5–40)

## 2019-04-26 ENCOUNTER — Other Ambulatory Visit: Payer: Self-pay | Admitting: Pulmonary Disease

## 2019-05-08 DIAGNOSIS — N182 Chronic kidney disease, stage 2 (mild): Secondary | ICD-10-CM | POA: Insufficient documentation

## 2019-05-09 ENCOUNTER — Telehealth: Payer: Self-pay | Admitting: Internal Medicine

## 2019-05-09 DIAGNOSIS — I10 Essential (primary) hypertension: Secondary | ICD-10-CM

## 2019-05-09 DIAGNOSIS — E785 Hyperlipidemia, unspecified: Secondary | ICD-10-CM

## 2019-05-09 DIAGNOSIS — I2510 Atherosclerotic heart disease of native coronary artery without angina pectoris: Secondary | ICD-10-CM

## 2019-05-09 NOTE — Telephone Encounter (Signed)
REviewed records from [place]   Pt had echocardiogram done in March 2020 that was normal  She also had carotid USN done   Equivical findings   CT recommended but not done   Stress test ordered but not done  Lipids in May 2020 LDL 142  HDL 53  Trig 135   REcomm:   1  WOuld sched repeat carotid USN to define particularly R ICA system  Pt should be on a statin   Recomm Crestor 20 mg   F/U lipid panel in 8 wks with AST/CK

## 2019-05-10 MED ORDER — ROSUVASTATIN CALCIUM 20 MG PO TABS: 20.0000 mg | ORAL_TABLET | Freq: Every day | ORAL | 3 refills | Status: AC

## 2019-05-10 NOTE — Addendum Note (Signed)
Addended by: Rodman Key on: 05/10/2019 02:18 PM   Modules accepted: Orders

## 2019-05-10 NOTE — Telephone Encounter (Signed)
Follow Up  Patient is calling in returning call. Please give patient a call back.

## 2019-05-10 NOTE — Telephone Encounter (Signed)
Notes recorded by Fay Records, MD on 04/16/2019 at 12:14 PM EDT  Hgb is normal  WBC mildly increased Not specific for anything  HgbA1C (marker of sugar control) is high  Needs tigher control of glucose  Electrolytes and kidney function are OK  Lipids need to be better given: DM and plaquing on coronary arteries  REcomm: Stop pravastatin  Start Crestor 20  F/U lipids in 8 wiks with AST

## 2019-05-10 NOTE — Telephone Encounter (Signed)
Left message for patient to call back  

## 2019-05-10 NOTE — Telephone Encounter (Signed)
Spoke with patient via spanish interpretor. Pt has been informed of lab results/review by Dr. Harrington Challenger and recommendations. She will stop pravastatin and start rosuvastatin 20 mg once a day and come back for repeat lipids/AST and CK on 07/05/19.  Aware of lab hours and aware to be fasting.  Also discussed that records were received and reviewed by Dr. Harrington Challenger and that echo in March 2020 was normal and that carotid USN should be repeated. Aware there are no restrictions for this test and made aware of the location. Adv that the scheduler for that department will contact her to schedule this study.  Pt verbalizes understanding of the above. Thanked me for call.

## 2019-05-17 ENCOUNTER — Other Ambulatory Visit: Payer: Self-pay | Admitting: Internal Medicine

## 2019-05-17 DIAGNOSIS — I6523 Occlusion and stenosis of bilateral carotid arteries: Secondary | ICD-10-CM

## 2019-05-27 ENCOUNTER — Other Ambulatory Visit: Payer: Self-pay

## 2019-05-27 ENCOUNTER — Ambulatory Visit (HOSPITAL_COMMUNITY)
Admission: RE | Admit: 2019-05-27 | Discharge: 2019-05-27 | Disposition: A | Discharge status: Home / Self Care | Payer: Medicaid Other | Source: Ambulatory Visit | Attending: Cardiology | Admitting: Cardiology

## 2019-05-27 DIAGNOSIS — I6523 Occlusion and stenosis of bilateral carotid arteries: Secondary | ICD-10-CM | POA: Diagnosis not present

## 2019-06-03 ENCOUNTER — Ambulatory Visit: Payer: Medicaid Other | Admitting: Pulmonary Disease

## 2019-06-03 ENCOUNTER — Encounter: Payer: Self-pay | Admitting: Pulmonary Disease

## 2019-06-03 ENCOUNTER — Other Ambulatory Visit: Payer: Self-pay

## 2019-06-03 VITALS — BP 132/70 | HR 71 | Temp 97.8°F | Ht <= 58 in | Wt 202.0 lb

## 2019-06-03 DIAGNOSIS — Z23 Encounter for immunization: Secondary | ICD-10-CM | POA: Diagnosis not present

## 2019-06-03 DIAGNOSIS — J4550 Severe persistent asthma, uncomplicated: Secondary | ICD-10-CM

## 2019-06-03 MED ORDER — INCRUSE ELLIPTA 62.5 MCG/INH IN AEPB: 1.0000 | INHALATION_SPRAY | Freq: Every day | RESPIRATORY_TRACT | 0 refills | Status: DC

## 2019-06-03 MED ORDER — MOMETASONE FURO-FORMOTEROL FUM 200-5 MCG/ACT IN AERO: 2.0000 | INHALATION_SPRAY | Freq: Two times a day (BID) | RESPIRATORY_TRACT | 0 refills | Status: DC

## 2019-06-03 MED ORDER — INCRUSE ELLIPTA 62.5 MCG/INH IN AEPB: 1.0000 | INHALATION_SPRAY | Freq: Every day | RESPIRATORY_TRACT | 6 refills | Status: DC

## 2019-06-03 MED ORDER — DULERA 200-5 MCG/ACT IN AERO: 2.0000 | INHALATION_SPRAY | Freq: Two times a day (BID) | RESPIRATORY_TRACT | 3 refills | Status: DC

## 2019-06-03 NOTE — Progress Notes (Signed)
Patient seen in the office today and instructed on use of incruse ellipta.  Patient expressed understanding and demonstrated technique.  

## 2019-06-03 NOTE — Patient Instructions (Addendum)
Severe Persistent Asthma  CONTINUE Dulera 200-5 two puffs twice a day START Incruse one puff once a day CONTINUE Albuterol as needed for shortness of breath or wheezing CONTINUE Singulair once a day CONTINUE Dupixent CONTINUE 2 mg prednisone every day. We will consider cutting back in the future  Follow-up in 3 months

## 2019-06-03 NOTE — Progress Notes (Signed)
Subjective:   PATIENT ID: Raven Turner GENDER: female DOB: August 09, 1971, MRN: [id number]   HPI  Chief Complaint  Patient presents with  . Follow-up    asthma-8-9 flare ups since Jan    Reason for Visit: Follow-up   [name] is a 48 year old Spanish-speaking female never smoker who presents for follow-up of asthma.  She is a former Dr. [place] Bells patient and was last seen in 09/2018. She is followed for severe persistent asthma. She is tolerating Dupixen and denies any side effects. She is on chronic prednisone and has been weaned down to prednisone 2 mg daily. She is compliant with Dulera. She uses her albuterol at least once a week for chest tightness which can be triggered by strong odors, exertion. Associated with wheezing. When she lived in [place], she stated she "lived" in the hospital. Last hospitalization in 2015. Overall, she feels she is doing significantly better on her current regimen.   I have personally reviewed patient's past medical/family/social history, allergies, current medications.  Past Medical History:  Diagnosis Date  . Anemia   . Asthma   . Bilateral hand numbness 06/25/2015  . Bone pain 02/27/2015   Diffuse bone pain in legs mostly but also in arms    . Breast pain, left 03/27/2015  . Callus of heel 12/16/2014  . Compression fracture   . Compression fracture of L1 lumbar vertebra ([facility]) 08/26/2014  . Compression fracture of T12 vertebra ([place]) 08/28/2014  . COPD (chronic obstructive pulmonary disease) ([place]) 2013  . Cushing syndrome ([place]) 2013   . Depression   . Diabetes ([place]) 2013   . Diabetes mellitus type 2, uncontrolled ([place]) 08/26/2014   Sees Dr. Katy Fitch.  Last visit 11/27/14.  No diabetic retinopathy    . Diabetes mellitus with neuropathy ([place])   . Diastolic dysfunction without heart failure 10/14/2014   Grade 2 diastolic dysfunction 2/2 pulm HTN   . Generalized anxiety disorder 03/27/2015  . HTN (hypertension) 2005   . Hx  of cataract surgery 11/03/2014  . LUQ abdominal pain 07/17/2015  . Medication management 05/12/2015  . Morbid obesity ([place]) 09/08/2014  . 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 Non-English speaking patient 09/08/2014  . OSA (obstructive sleep apnea) 05/20/2015  . Osteopenia 11/19/2013   Dx with osteopenia in lumbar vertebra with partial compression of L1  . Pain due to onychomycosis of toenail 03/27/2015  . Secondary Cushing's syndrome/ iatrogenic   . Skin rash 07/17/2015  . Tachycardia 01/13/2015  . Upper airway cough syndrome 09/05/2014   Followed in Pulmonary clinic/ [facility]/ [place]  - Trial off advair      09/05/2014 >>>  - trial off theoph 09/17/2014 and added flutter  - rec reduce dulera to 100 2 bid 07/13/2015     . Vision changes 06/25/2015  . Vitamin D deficiency 08/28/2014     Family History  Problem Relation Age of Onset  . Diabetes Mother   . Stroke Father   . Asthma Father   . Hypertension Sister   . Hypertension Brother      Social History   Occupational History  . Not on file  Tobacco Use  . Smoking status: Never Smoker  . Smokeless tobacco: Never Used  Substance and Sexual Activity  . Alcohol use: No    Alcohol/week: 0.0 standard drinks  . Drug use: No  . Sexual activity: Yes    Birth control/protection: Surgical    Allergies  Allergen Reactions  . Hydrocortisone  Rash and Hives  . Shellfish Allergy Anaphylaxis and Hives  . Sulfamethoxazole-Trimethoprim Rash, Other (See Comments) and Itching    Facial redness with pruritus  Facial redness with pruritus      Outpatient Medications Prior to Visit  Medication Sig Dispense Refill  . albuterol (PROVENTIL HFA;VENTOLIN HFA) 108 (90 Base) MCG/ACT inhaler Inhale 2 puffs into the lungs every 4 (four) hours as needed for wheezing or shortness of breath (((PLAN B))). 18 g 6  . albuterol (PROVENTIL) (2.5 MG/3ML) 0.083% nebulizer solution USE 1 VIAL VIA NEBULIZER EVERY 4 HOURS AS NEEDED FOR WHEEZING OR SHORTNESS OF  BREATH (Patient taking differently: Take 2.5 mg by nebulization every 4 (four) hours as needed for wheezing or shortness of breath. ) 90 mL 0  . aspirin EC 81 MG tablet Take 1 tablet (81 mg total) by mouth daily. 90 tablet 3  . BD INSULIN SYRINGE U-500 31G X 6MM 0.5 ML MISC U UTD TID SUBCUTANEOUSLY    . Blood Glucose Monitoring Suppl (ACCU-CHEK AVIVA PLUS) W/DEVICE KIT Used as directed 1 kit 0  . BUTRANS 7.5 MCG/HR PTWK 15 mcg.     . cabergoline (DOSTINEX) 0.5 MG tablet Take 0.5 tablets (0.25 mg total) by mouth 2 (two) times a week. 10 tablet 3  . calcium carbonate (CALCIUM 600) 600 MG TABS tablet Take 1 tablet by mouth daily.    . cholecalciferol (VITAMIN D) 1000 UNITS tablet Take 1,000 Units by mouth every morning.    . cloNIDine (CATAPRES) 0.2 MG tablet Take 0.2 mg by mouth 2 (two) times daily.    . DULERA 200-5 MCG/ACT AERO INHALE 2 PUFFS INTO THE LUNGS TWICE DAILY 13 g 0  . DUPIXENT 300 MG/2ML prefilled syringe Inject 1 pen/syringe subcutaneously every other week 4 mL 5  . EUCRISA 2 % OINT APP 1 APPLICATION AA BID PRN    . famotidine (PEPCID) 20 MG tablet Take by mouth.    . furosemide (LASIX) 20 MG tablet Take 1 tablet (20 mg total) by mouth every morning. 30 tablet 2  . gabapentin (NEURONTIN) 400 MG capsule Take 2 capsules (800 mg total) by mouth 3 (three) times daily. 180 capsule 3  . glucose blood (RELION GLUCOSE TEST STRIPS) test strip 1 each by Other route 4 (four) times daily. ICD code E11.65 400 each 3  . HUMULIN R U-500 KWIKPEN 500 UNIT/ML kwikpen Inject 120-180 Units into the skin 3 (three) times daily. 180 U before breakfast, 160 U before lunch, 140 U before dinner 27 mL 11  . hydrOXYzine (ATARAX/VISTARIL) 25 MG tablet Take 25 mg by mouth at bedtime as needed.    . Insulin Pen Needle (B-D ULTRAFINE III SHORT PEN) 31G X 8 MM MISC 1 application by Does not apply route daily. 100 each 3  . Insulin Syringe-Needle U-100 (BD INSULIN SYRINGE ULTRAFINE) 31G X 15/64" 1 ML MISC 1 each by  Does not apply route 3 (three) times daily. 300 each 3  . Lancet Devices (ACCU-CHEK SOFTCLIX) lancets Use as instructed 1 each 0  . levothyroxine (SYNTHROID, LEVOTHROID) 75 MCG tablet TK 1 T PO QD IN THE MORNING OES    . losartan (COZAAR) 100 MG tablet Take 1 tablet (100 mg total) by mouth every morning. 30 tablet 6  . methocarbamol (ROBAXIN) 750 MG tablet Take 1 tablet (750 mg total) by mouth 3 (three) times daily. MUST MAKE APPT FOR FURTHER REFILLS 60 tablet 0  . Misc. Devices (CANE) MISC 1 each by Does not apply route daily.   1 each 0  . montelukast (SINGULAIR) 10 MG tablet Take 1 tablet (10 mg total) by mouth at bedtime. 30 tablet 6  . NARCAN 4 MG/0.1ML LIQD nasal spray kit U UTS PRN    . omeprazole (PRILOSEC) 20 MG capsule Take 20 mg by mouth daily.    Marland Kitchen PARoxetine (PAXIL) 20 MG tablet Take 1 tablet (20 mg total) by mouth daily. MUST MAKE APPT FOR FURTHER REFILLS 30 tablet 1  . predniSONE (DELTASONE) 1 MG tablet Take 2 tablets (2 mg total) by mouth daily. 60 tablet 2  . ReliOn Ultra Thin Lancets MISC 1 each by Does not apply route 4 (four) times daily. ICD code E11.65 400 each 3  . Respiratory Therapy Supplies (FLUTTER) DEVI Use as directed 1 each 0  . rosuvastatin (CRESTOR) 20 MG tablet Take 1 tablet (20 mg total) by mouth daily. 90 tablet 3  . Spacer/Aero-Holding Chambers (AEROCHAMBER MV) inhaler Use as instructed 1 each 0  . traZODone (DESYREL) 50 MG tablet Take 50 mg by mouth at bedtime.    Marland Kitchen ULTICARE MICRO PEN NEEDLES 32G X 4 MM MISC USE AS DIRECTED 100 each 2  . verapamil (CALAN-SR) 180 MG CR tablet Take 2 tablets (360 mg total) by mouth at bedtime. 180 tablet 2  . Vitamin D, Ergocalciferol, (DRISDOL) 1.25 MG (50000 UT) CAPS capsule Take by mouth.    . nitroGLYCERIN (NITROSTAT) 0.4 MG SL tablet Place 1 tablet (0.4 mg total) under the tongue every 5 (five) minutes as needed. 25 tablet 3   Facility-Administered Medications Prior to Visit  Medication Dose Route Frequency Provider Last  Rate Last Dose  . betamethasone acetate-betamethasone sodium phosphate (CELESTONE) injection 3 mg  3 mg Intramuscular Once Daylene Katayama M, DPM      . DOBUTamine (DOBUTREX) 1,000 mcg/mL in dextrose 5% 250 mL infusion  30 mcg/kg/min Intravenous Continuous Croitoru, Mihai, MD 145.3 mL/hr at 04/22/15 1300 30 mcg/kg/min at 04/22/15 1300  . umeclidinium bromide (INCRUSE ELLIPTA) 62.5 MCG/INH 1 puff  1 puff Inhalation Daily Ladell Pier, MD        Review of Systems  Constitutional: Negative for chills, diaphoresis, fever, malaise/fatigue and weight loss.  HENT: Negative for congestion, ear pain and sore throat.   Respiratory: Negative for cough, hemoptysis, sputum production, shortness of breath and wheezing.   Cardiovascular: Positive for palpitations. Negative for chest pain and leg swelling.  Gastrointestinal: Negative for abdominal pain, heartburn and nausea.  Genitourinary: Negative for frequency.  Musculoskeletal: Negative for joint pain and myalgias.  Skin: Negative for itching and rash.  Neurological: Positive for headaches. Negative for dizziness and weakness.  Endo/Heme/Allergies: Does not bruise/bleed easily.  Psychiatric/Behavioral: Positive for depression. The patient is nervous/anxious.      Objective:   Vitals:   06/03/19 1501 06/03/19 1502  BP:  132/70  Pulse:  71  Temp: 97.8 F (36.6 C)   TempSrc: Oral   SpO2:  96%  Weight: 202 lb (91.6 kg)   Height: 4' 10" (1.473 m)    SpO2: 96 % O2 Device: None (Room air)  Physical Exam: General: Well-appearing, no acute distress HENT: [place], AT Eyes: EOMI, no scleral icterus Respiratory: Clear to auscultation bilaterally.  No crackles, wheezing or rales Cardiovascular: RRR, -M/R/G, no JVD GI: BS+, soft, nontender Extremities:-Edema,-tenderness Neuro: AAO x4, CNII-XII grossly intact Skin: Intact, no rashes or bruising Psych: Normal mood, normal affect  Data Reviewed:  Imaging: CT Chest 11/25/18 - Mild air trapping.  Normal parenchyma  PFT:  Labs:  Imaging, labs and tests noted above have been reviewed independently by me.    Assessment & Plan:   Discussion: 48 year old female with severe persistent asthma on chronic prednisone, well-controlled on Dupixent however still has symptoms of chest tightness. No recent exacerbations.  Severe Persistent Asthma CONTINUE Dulera 200-5 two puffs twice a day START Incruse one puff once a day CONTINUE Albuterol as needed for shortness of breath or wheezing CONTINUE Singulair once a day CONTINUE Dupixent CONTINUE 2 mg prednisone every day. We will consider cutting back in the future  Regarding safety to use Prolia in setting of Dupixent, I will send message to Pharmacy team.  Health Maintenance Immunization History  Administered Date(s) Administered  . Hepatitis B 03/23/2015, 07/06/2015  . Influenza Split 05/25/2015  . Influenza,inj,Quad PF,6+ Mos 05/13/2016, 05/29/2017, 07/13/2018, 06/03/2019  . Pneumococcal Polysaccharide-23 07/31/2017  . Pneumococcal-Unspecified 04/12/2012  . Tdap 06/24/2015     Orders Placed This Encounter  Procedures  . Flu Vaccine QUAD 36+ mos IM   Meds ordered this encounter  Medications  . mometasone-formoterol (DULERA) 200-5 MCG/ACT AERO    Sig: Inhale 2 puffs into the lungs 2 (two) times daily.    Dispense:  8.8 g    Refill:  0    Order Specific Question:   Lot Number?    Answer:   [id number]    Order Specific Question:   Expiration Date?    Answer:   03/26/2020    Order Specific Question:   Manufacturer?    Answer:   [employer] [[id number]]    Order Specific Question:   Quantity    Answer:   1  . umeclidinium bromide (INCRUSE ELLIPTA) 62.5 MCG/INH AEPB    Sig: Inhale 1 puff into the lungs daily.    Dispense:  1 each    Refill:  0    Order Specific Question:   Lot Number?    Answer:   [id number]    Order Specific Question:   Expiration Date?    Answer:   09/13/2019    Order Specific Question:   Manufacturer?    Answer:    [employer] [[id number]]    Order Specific Question:   Quantity    Answer:   4  . mometasone-formoterol (DULERA) 200-5 MCG/ACT AERO    Sig: Inhale 2 puffs into the lungs 2 (two) times daily.    Dispense:  8.8 g    Refill:  3    Needs OV for future refills  . umeclidinium bromide (INCRUSE ELLIPTA) 62.5 MCG/INH AEPB    Sig: Inhale 1 puff into the lungs daily.    Dispense:  1 each    Refill:  6    Return in about 3 months (around 09/02/2019).  Greater than 50% of this patient 25-minute office visit was spent face-to-face in counseling with the patient/family. We discussed medical diagnosis and treatment plan as noted.  [place], MD [facility] 06/03/2019 3:45 PM  Office Number [phone]

## 2019-06-04 ENCOUNTER — Telehealth: Payer: Self-pay

## 2019-06-04 NOTE — Telephone Encounter (Signed)
Documentation sent to PCP. Nothing further needed at this time.

## 2019-06-04 NOTE — Telephone Encounter (Signed)
-----   Message from [place], MD sent at 06/03/2019  5:23 PM EDT ----- Regarding: FW: Drug reaction? Please send message to patient's PCP [name], NP).   Apolonio Schneiders,  Regarding your question about interference of Prolia with any pulmonary medications: OK from Pulmonary standpoint to use Prolia. Per pharmacy, patient will need a BMP within 14 days to monitor for hypocalcemia.  Regards, Rodman Pickle, MD [facility] ----- Message ----- From: Deboraha Sprang, [facility] Sent: 06/03/2019   4:13 PM EDT To: Margaretha Seeds, MD Subject: RE: Drug reaction?                             Dr. Loanne Drilling,  No drug interactions noted with use of Prolia.  The only concern I can think of would be risk of hypocalcemia and it is recommended to check BMP within 14 days of Prolia injections.  Let me know if you need any further assistance.  Mariella Saa, PharmD, [place], [place] [facility] Pharmacist [phone]  06/03/2019 4:17 PM     ----- Message ----- From: Margaretha Seeds, MD Sent: 06/03/2019   3:50 PM EDT To: Deboraha Sprang, RPH Subject: Drug reaction?                                 Hi Amber,  Can you review whether Prolia is contraindicated with any of this patient's current meds specifically Dupixent?  JE

## 2019-06-11 ENCOUNTER — Encounter: Payer: Self-pay | Admitting: Pulmonary Disease

## 2019-07-01 ENCOUNTER — Telehealth: Payer: Self-pay | Admitting: Pulmonary Disease

## 2019-07-01 NOTE — Telephone Encounter (Signed)
The last ov note was faxed via Epic to [name], NP  Called and spoke with the pt and notified of this and she verbalized understanding  Nothing further needed

## 2019-07-04 ENCOUNTER — Other Ambulatory Visit: Payer: Self-pay | Admitting: Nurse Practitioner

## 2019-07-04 DIAGNOSIS — Z1231 Encounter for screening mammogram for malignant neoplasm of breast: Secondary | ICD-10-CM

## 2019-07-05 ENCOUNTER — Other Ambulatory Visit: Payer: Medicaid Other | Admitting: *Deleted

## 2019-07-05 ENCOUNTER — Other Ambulatory Visit: Payer: Self-pay

## 2019-07-05 DIAGNOSIS — I10 Essential (primary) hypertension: Secondary | ICD-10-CM

## 2019-07-05 DIAGNOSIS — E785 Hyperlipidemia, unspecified: Secondary | ICD-10-CM

## 2019-07-05 DIAGNOSIS — I2510 Atherosclerotic heart disease of native coronary artery without angina pectoris: Secondary | ICD-10-CM

## 2019-07-06 LAB — LIPID PANEL
Chol/HDL Ratio: 2.7 ratio (ref 0.0–4.4)
Cholesterol, Total: 135 mg/dL (ref 100–199)
HDL: 50 mg/dL (ref >39)
LDL Chol Calc (NIH): 66 mg/dL (ref 0–99)
Triglycerides: 104 mg/dL (ref 0–149)
VLDL Cholesterol Cal: 19 mg/dL (ref 5–40)

## 2019-07-06 LAB — CK: Total CK: 173 U/L (ref 32–182)

## 2019-07-06 LAB — AST: AST: 17 IU/L (ref 0–40)

## 2019-07-15 ENCOUNTER — Other Ambulatory Visit: Payer: Self-pay | Admitting: Pulmonary Disease

## 2019-07-19 ENCOUNTER — Telehealth: Payer: Self-pay | Admitting: Pulmonary Disease

## 2019-07-19 MED ORDER — PREDNISONE 1 MG PO TABS: 2.0000 mg | ORAL_TABLET | Freq: Every day | ORAL | 1 refills | Status: DC

## 2019-07-19 NOTE — Telephone Encounter (Signed)
I refilled her pred  She needs appt with Loanne Drilling for Dec 2020   Spoke with the pt via pacific interpreters and scheduled appt for 09/02/19

## 2019-07-30 ENCOUNTER — Other Ambulatory Visit: Payer: Self-pay | Admitting: Nurse Practitioner

## 2019-07-30 DIAGNOSIS — N644 Mastodynia: Secondary | ICD-10-CM

## 2019-08-19 ENCOUNTER — Telehealth: Payer: Self-pay | Admitting: Pulmonary Disease

## 2019-08-19 NOTE — Telephone Encounter (Signed)
I have faxed the last ov note to the number requested to attn Myriam

## 2019-08-21 ENCOUNTER — Other Ambulatory Visit: Payer: Self-pay | Admitting: Nurse Practitioner

## 2019-08-26 ENCOUNTER — Other Ambulatory Visit: Payer: Self-pay | Admitting: *Deleted

## 2019-08-26 DIAGNOSIS — J4550 Severe persistent asthma, uncomplicated: Secondary | ICD-10-CM

## 2019-08-26 MED ORDER — DUPIXENT 300 MG/2ML ~~LOC~~ SOSY: PREFILLED_SYRINGE | SUBCUTANEOUS | 5 refills | Status: DC

## 2019-08-27 ENCOUNTER — Telehealth: Payer: Self-pay | Admitting: Pulmonary Disease

## 2019-08-28 NOTE — Telephone Encounter (Signed)
Notes have been faxed

## 2019-09-02 ENCOUNTER — Other Ambulatory Visit: Payer: Self-pay | Admitting: Nurse Practitioner

## 2019-09-02 ENCOUNTER — Encounter: Payer: Self-pay | Admitting: Pulmonary Disease

## 2019-09-02 ENCOUNTER — Ambulatory Visit
Admission: RE | Admit: 2019-09-02 | Discharge: 2019-09-02 | Disposition: A | Discharge status: Home / Self Care | Payer: Medicaid Other | Source: Ambulatory Visit | Attending: Nurse Practitioner | Admitting: Nurse Practitioner

## 2019-09-02 ENCOUNTER — Ambulatory Visit: Payer: Medicaid Other | Admitting: Pulmonary Disease

## 2019-09-02 ENCOUNTER — Other Ambulatory Visit: Payer: Self-pay

## 2019-09-02 VITALS — BP 124/66 | HR 88 | Temp 97.3°F | Ht <= 58 in | Wt 196.6 lb

## 2019-09-02 DIAGNOSIS — N644 Mastodynia: Secondary | ICD-10-CM

## 2019-09-02 DIAGNOSIS — J4550 Severe persistent asthma, uncomplicated: Secondary | ICD-10-CM

## 2019-09-02 DIAGNOSIS — J45909 Unspecified asthma, uncomplicated: Secondary | ICD-10-CM | POA: Insufficient documentation

## 2019-09-02 DIAGNOSIS — N6310 Unspecified lump in the right breast, unspecified quadrant: Secondary | ICD-10-CM

## 2019-09-02 DIAGNOSIS — J45901 Unspecified asthma with (acute) exacerbation: Secondary | ICD-10-CM | POA: Insufficient documentation

## 2019-09-02 MED ORDER — SPIRIVA RESPIMAT 1.25 MCG/ACT IN AERS: 2.0000 | INHALATION_SPRAY | Freq: Every day | RESPIRATORY_TRACT | 0 refills | Status: DC

## 2019-09-02 MED ORDER — SPIRIVA RESPIMAT 1.25 MCG/ACT IN AERS: 2.0000 | INHALATION_SPRAY | Freq: Every day | RESPIRATORY_TRACT | 5 refills | Status: DC

## 2019-09-02 MED ORDER — DULERA 200-5 MCG/ACT IN AERO: 2.0000 | INHALATION_SPRAY | Freq: Two times a day (BID) | RESPIRATORY_TRACT | 3 refills | Status: DC

## 2019-09-02 NOTE — Patient Instructions (Signed)
Severe Persistent Asthma CONTINUE Dulera 200-5 TWO puffs TWICE a day START Spiriva 1.5 mcg TWO puffs ONCE a day CONTINUE Albuterol as needed for shortness of breath or wheezing CONTINUE Singulair ONCE a day CONTINUE 2 mg prednisone every day. We will consider cutting back in the future CONTINUE Dupixent as scheduled  We will send letter to your PCP Warnell Forester, NP) regarding Prolia. After discussing with our Pulmonary pharmacy team, there is no contraindication to continue Prolia with her current pulmonary regimen.

## 2019-09-02 NOTE — Progress Notes (Signed)
Subjective:   PATIENT ID: Raven Turner GENDER: female DOB: 20-Sep-1970, MRN: [id number]   HPI  Chief Complaint  Patient presents with  . Follow-up    3 month rov.  pt c/o sob with exertion, unchanged since last visit.      Reason for Visit: Follow-up   Raven Turner is a 48 year-old Spanish speaking female never smoker who presents follow-up of severe persistent asthma.   She is followed for severe persistent asthma. She is on [place] and Dupixent. On our last visit, she was tried on Incruse but felt the inhaler was too powdery. She is still on chronic prednisone 2 mg daily. In the past, Dr. Lake Bells has tried to reduce her to 1 mg daily but she experienced rebound symptoms. She has been using her albuterol more frequently for wheezing and shortness of breath since the weather change. Her symptoms are usually exacerbated by activity. Denies coughing. Denies nocturnal awakenings.  When she lived in [place], she stated she "lived" in the hospital. Last hospitalization in 2015.    I have personally reviewed patient's past medical/family/social history/allergies/current medications.  Past Medical History:  Diagnosis Date  . Anemia   . Asthma   . Bilateral hand numbness 06/25/2015  . Bone pain 02/27/2015   Diffuse bone pain in legs mostly but also in arms    . Breast pain, left 03/27/2015  . Callus of heel 12/16/2014  . Compression fracture   . Compression fracture of L1 lumbar vertebra (HCC) 08/26/2014  . Compression fracture of T12 vertebra ([facility]) 08/28/2014  . COPD (chronic obstructive pulmonary disease) ([place]) 2013  . Cushing syndrome ([place]) 2013   . Depression   . Diabetes ([place]) 2013   . Diabetes mellitus type 2, uncontrolled ([place]) 08/26/2014   Sees Dr. Katy Fitch.  Last visit 11/27/14.  No diabetic retinopathy    . Diabetes mellitus with neuropathy ([place])   . Diastolic dysfunction without heart failure 10/14/2014   Grade 2 diastolic dysfunction 2/2 pulm HTN     . Generalized anxiety disorder 03/27/2015  . HTN (hypertension) 2005   . Hx of cataract surgery 11/03/2014  . LUQ abdominal pain 07/17/2015  . Medication management 05/12/2015  . Morbid obesity ([place]) 09/08/2014  . 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 Non-English speaking patient 09/08/2014  . OSA (obstructive sleep apnea) 05/20/2015  . Osteopenia 11/19/2013   Dx with osteopenia in lumbar vertebra with partial compression of L1  . Pain due to onychomycosis of toenail 03/27/2015  . Secondary Cushing's syndrome/ iatrogenic   . Skin rash 07/17/2015  . Tachycardia 01/13/2015  . Upper airway cough syndrome 09/05/2014   Followed in Pulmonary clinic/ [facility]/ [place]  - Trial off advair      09/05/2014 >>>  - trial off theoph 09/17/2014 and added flutter  - rec reduce dulera to 100 2 bid 07/13/2015     . Vision changes 06/25/2015  . Vitamin D deficiency 08/28/2014     Family History  Problem Relation Age of Onset  . Diabetes Mother   . Stroke Father   . Asthma Father   . Hypertension Sister   . Hypertension Brother      Social History   Occupational History  . Not on file  Tobacco Use  . Smoking status: Never Smoker  . Smokeless tobacco: Never Used  Substance and Sexual Activity  . Alcohol use: No    Alcohol/week: 0.0 standard drinks  . Drug use: No  . Sexual activity: Yes  Birth control/protection: Surgical    Allergies  Allergen Reactions  . Hydrocortisone Rash and Hives  . Shellfish Allergy Anaphylaxis and Hives  . Sulfamethoxazole-Trimethoprim Rash, Other (See Comments) and Itching    Facial redness with pruritus  Facial redness with pruritus      Outpatient Medications Prior to Visit  Medication Sig Dispense Refill  . albuterol (PROVENTIL HFA;VENTOLIN HFA) 108 (90 Base) MCG/ACT inhaler Inhale 2 puffs into the lungs every 4 (four) hours as needed for wheezing or shortness of breath (((PLAN B))). 18 g 6  . albuterol (PROVENTIL) (2.5 MG/3ML) 0.083% nebulizer solution USE  1 VIAL VIA NEBULIZER EVERY 4 HOURS AS NEEDED FOR WHEEZING OR SHORTNESS OF BREATH (Patient taking differently: Take 2.5 mg by nebulization every 4 (four) hours as needed for wheezing or shortness of breath. ) 90 mL 0  . aspirin EC 81 MG tablet Take 1 tablet (81 mg total) by mouth daily. 90 tablet 3  . BD INSULIN SYRINGE U-500 31G X 6MM 0.5 ML MISC U UTD TID SUBCUTANEOUSLY    . Blood Glucose Monitoring Suppl (ACCU-CHEK AVIVA PLUS) W/DEVICE KIT Used as directed 1 kit 0  . BUTRANS 7.5 MCG/HR PTWK 15 mcg.     . cabergoline (DOSTINEX) 0.5 MG tablet Take 0.5 tablets (0.25 mg total) by mouth 2 (two) times a week. 10 tablet 3  . calcium carbonate (CALCIUM 600) 600 MG TABS tablet Take 1 tablet by mouth daily.    . cholecalciferol (VITAMIN D) 1000 UNITS tablet Take 1,000 Units by mouth every morning.    . cloNIDine (CATAPRES) 0.2 MG tablet Take 0.2 mg by mouth 2 (two) times daily.    . dupilumab (DUPIXENT) 300 MG/2ML prefilled syringe Inject 1 pen/syringe subcutaneously every other week 4 mL 5  . EUCRISA 2 % OINT APP 1 APPLICATION AA BID PRN    . famotidine (PEPCID) 20 MG tablet Take by mouth.    . furosemide (LASIX) 20 MG tablet Take 1 tablet (20 mg total) by mouth every morning. 30 tablet 2  . gabapentin (NEURONTIN) 400 MG capsule Take 2 capsules (800 mg total) by mouth 3 (three) times daily. 180 capsule 3  . glucose blood (RELION GLUCOSE TEST STRIPS) test strip 1 each by Other route 4 (four) times daily. ICD code E11.65 400 each 3  . HUMULIN R U-500 KWIKPEN 500 UNIT/ML kwikpen Inject 120-180 Units into the skin 3 (three) times daily. 180 U before breakfast, 160 U before lunch, 140 U before dinner 27 mL 11  . hydrOXYzine (ATARAX/VISTARIL) 25 MG tablet Take 25 mg by mouth at bedtime as needed.    . Insulin Pen Needle (B-D ULTRAFINE III SHORT PEN) 31G X 8 MM MISC 1 application by Does not apply route daily. 100 each 3  . Insulin Syringe-Needle U-100 (BD INSULIN SYRINGE ULTRAFINE) 31G X 15/64" 1 ML MISC 1 each  by Does not apply route 3 (three) times daily. 300 each 3  . Lancet Devices (ACCU-CHEK SOFTCLIX) lancets Use as instructed 1 each 0  . levothyroxine (SYNTHROID, LEVOTHROID) 75 MCG tablet TK 1 T PO QD IN THE MORNING OES    . losartan (COZAAR) 100 MG tablet Take 1 tablet (100 mg total) by mouth every morning. 30 tablet 6  . methocarbamol (ROBAXIN) 750 MG tablet Take 1 tablet (750 mg total) by mouth 3 (three) times daily. MUST MAKE APPT FOR FURTHER REFILLS 60 tablet 0  . Misc. Devices (CANE) MISC 1 each by Does not apply route daily. 1 each 0  .  mometasone-formoterol (DULERA) 200-5 MCG/ACT AERO Inhale 2 puffs into the lungs 2 (two) times daily. 8.8 g 0  . mometasone-formoterol (DULERA) 200-5 MCG/ACT AERO Inhale 2 puffs into the lungs 2 (two) times daily. 8.8 g 3  . montelukast (SINGULAIR) 10 MG tablet Take 1 tablet (10 mg total) by mouth at bedtime. 30 tablet 6  . NARCAN 4 MG/0.1ML LIQD nasal spray kit U UTS PRN    . nitroGLYCERIN (NITROSTAT) 0.4 MG SL tablet Place 1 tablet (0.4 mg total) under the tongue every 5 (five) minutes as needed. 25 tablet 3  . omeprazole (PRILOSEC) 20 MG capsule Take 20 mg by mouth daily.    Marland Kitchen PARoxetine (PAXIL) 20 MG tablet Take 1 tablet (20 mg total) by mouth daily. MUST MAKE APPT FOR FURTHER REFILLS 30 tablet 1  . predniSONE (DELTASONE) 1 MG tablet Take 2 tablets (2 mg total) by mouth daily. 60 tablet 1  . ReliOn Ultra Thin Lancets MISC 1 each by Does not apply route 4 (four) times daily. ICD code E11.65 400 each 3  . Respiratory Therapy Supplies (FLUTTER) DEVI Use as directed 1 each 0  . rosuvastatin (CRESTOR) 20 MG tablet Take 1 tablet (20 mg total) by mouth daily. 90 tablet 3  . Spacer/Aero-Holding Chambers (AEROCHAMBER MV) inhaler Use as instructed 1 each 0  . traZODone (DESYREL) 50 MG tablet Take 50 mg by mouth at bedtime.    Marland Kitchen ULTICARE MICRO PEN NEEDLES 32G X 4 MM MISC USE AS DIRECTED 100 each 2  . umeclidinium bromide (INCRUSE ELLIPTA) 62.5 MCG/INH AEPB Inhale 1  puff into the lungs daily. 1 each 0  . umeclidinium bromide (INCRUSE ELLIPTA) 62.5 MCG/INH AEPB Inhale 1 puff into the lungs daily. 1 each 6  . verapamil (CALAN-SR) 180 MG CR tablet Take 2 tablets (360 mg total) by mouth at bedtime. 180 tablet 2  . Vitamin D, Ergocalciferol, (DRISDOL) 1.25 MG (50000 UT) CAPS capsule Take by mouth.     Facility-Administered Medications Prior to Visit  Medication Dose Route Frequency Provider Last Rate Last Admin  . betamethasone acetate-betamethasone sodium phosphate (CELESTONE) injection 3 mg  3 mg Intramuscular Once Daylene Katayama M, DPM      . DOBUTamine (DOBUTREX) 1,000 mcg/mL in dextrose 5% 250 mL infusion  30 mcg/kg/min Intravenous Continuous Croitoru, Mihai, MD 145.3 mL/hr at 04/22/15 1300 30 mcg/kg/min at 04/22/15 1300  . umeclidinium bromide (INCRUSE ELLIPTA) 62.5 MCG/INH 1 puff  1 puff Inhalation Daily Ladell Pier, MD        Review of Systems  Constitutional: Negative for chills, diaphoresis, fever, malaise/fatigue and weight loss.  HENT: Negative for congestion.   Respiratory: Positive for shortness of breath. Negative for cough, hemoptysis, sputum production and wheezing.   Cardiovascular: Negative for chest pain, palpitations and leg swelling.     Objective:   Vitals:   09/02/19 1008  BP: 124/66  Pulse: 88  Temp: (!) 97.3 F (36.3 C)  TempSrc: Temporal  SpO2: 99%  Weight: 196 lb 9.6 oz (89.2 kg)  Height: 4' 10"  (1.473 m)     Physical Exam: General: Obese, well-appearing, no acute distress HENT: [place], AT Eyes: EOMI, no scleral icterus Respiratory: Minimal wheezing.  No rhonchi Cardiovascular: RRR, -M/R/G, no JVD GI: BS+, soft, nontender Extremities:-Edema,-tenderness Neuro: AAO x4, CNII-XII grossly intact Skin: Intact, no rashes or bruising Psych: Normal mood, normal affect  Data Reviewed:  Imaging: CT Chest 11/25/18 - Mild air trapping. Normal parenchyma  PFT: 09/02/19 FVC 1.99 (76%) FEV1 1.73 (81%) Ratio  81  TLC  103% DLCO 131%. No significant bronchodilator effect. Interpretation: No obstructive defect or restrictive defect seen on spirometry. Elevated DLCO can be seen in asthma, obesity and large lung volumes.  Labs: CBC    Component Value Date/Time   WBC 12.1 (H) 04/15/2019 1231   WBC 11.3 (H) 03/13/2019 1045   WBC 10.8 (H) 09/06/2018 1749   RBC 4.36 04/15/2019 1231   RBC 4.49 03/13/2019 1045   HGB 11.8 04/15/2019 1231   HCT 36.4 04/15/2019 1231   PLT 451 (H) 04/15/2019 1231   MCV 84 04/15/2019 1231   MCH 27.1 04/15/2019 1231   MCH 27.4 03/13/2019 1045   MCHC 32.4 04/15/2019 1231   MCHC 32.0 03/13/2019 1045   RDW 14.5 04/15/2019 1231   LYMPHSABS 4.2 (H) 03/13/2019 1045   LYMPHSABS 3.8 (H) 05/09/2018 1126   MONOABS 0.6 03/13/2019 1045   EOSABS 0.4 03/13/2019 1045   EOSABS 0.4 05/09/2018 1126   BASOSABS 0.1 03/13/2019 1045   BASOSABS 0.1 05/09/2018 1126   Imaging, labs and tests noted above have been reviewed independently by me.    Assessment & Plan:   Discussion: 48 year old female with severe persistent asthma on chronic prednisone, on Dupixent add-on. Continues to have breakthrough symptoms. She is not in active exacerbation.  Severe Persistent Asthma - uncontrolled CONTINUE Dulera 200-5 TWO puffs TWICE a day START Spiriva 1.5 mcg TWO puffs ONCE a day CONTINUE Albuterol as needed for shortness of breath or wheezing CONTINUE Singulair ONCE a day CONTINUE 2 mg prednisone every day. We will consider cutting back in the future CONTINUE Dupixent as scheduled  We will send letter to your PCP Warnell Forester, NP) regarding Prolia. After discussing with our Pulmonary pharmacy team, there is no contraindication to continue Prolia with her current pulmonary regimen.  Health Maintenance Immunization History  Administered Date(s) Administered  . Hepatitis B 03/23/2015, 07/06/2015  . Influenza Split 05/25/2015  . Influenza,inj,Quad PF,6+ Mos 05/13/2016, 05/29/2017, 07/13/2018,  06/03/2019  . Pneumococcal Polysaccharide-23 07/31/2017  . Pneumococcal-Unspecified 04/12/2012  . Tdap 06/24/2015    No orders of the defined types were placed in this encounter.  Meds ordered this encounter  Medications  . Tiotropium Bromide Monohydrate (SPIRIVA RESPIMAT) 1.25 MCG/ACT AERS    Sig: Inhale 2 puffs into the lungs daily.    Dispense:  2 g    Refill:  0    Order Specific Question:   Lot Number?    Answer:   [id number]    Order Specific Question:   Expiration Date?    Answer:   05/13/2021    Order Specific Question:   Quantity    Answer:   2  . mometasone-formoterol (DULERA) 200-5 MCG/ACT AERO    Sig: Inhale 2 puffs into the lungs 2 (two) times daily.    Dispense:  8.8 g    Refill:  3    Return in about 3 months (around 12/01/2019).  Greater than 50% of this patient 25-minute office visit was spent face-to-face in counseling with the patient/family. We discussed medical diagnosis and treatment plan as noted.  [place], MD [facility] 09/02/2019 10:05 AM  Office Number [phone]

## 2019-09-12 ENCOUNTER — Other Ambulatory Visit: Payer: Self-pay

## 2019-09-12 DIAGNOSIS — D500 Iron deficiency anemia secondary to blood loss (chronic): Secondary | ICD-10-CM

## 2019-09-15 NOTE — Progress Notes (Signed)
Raven Turner   [phone] [phone]   Clinic Follow up Note   Patient Care Team: [name], NP as PCP - General (Nurse Practitioner) Fay Records, MD as PCP - Cardiology (Cardiology) 09/16/2019  CHIEF COMPLAINT: F/u iron deficiency anemia  CURRENT THERAPY: Oral iron every other day in 03/2019, increased to every day in 08/2019, and IV Feraheme PRN (last given 07/2018)  INTERVAL HISTORY: [name] presents by herself. A remote Spanish Interpreter was utilized. She was last seen in 03/2019 when she started taking oral iron every other day as instructed. In December she became extremely fatigued and was seen by PCP at [facility], and was instructed to increase oral iron to once daily. She remains functional with rest and more effort. She has constipation but otherwise tolerates oral iron well. Last BM 3 days ago. She has monthly menses lasting 5-7 days, heavy bleeding for 4 days. That may be little heavier lately. TSH was also recently abnormal and synthroid was adjusted. Denies other bleeding such as epistaxis, hematuria, or hematochezia. Denies recent fever, chills, cough, chest pain, dyspnea, headache, dizziness, or pain.    MEDICAL HISTORY:  Past Medical History:  Diagnosis Date  . Anemia   . Asthma   . Bilateral hand numbness 06/25/2015  . Bone pain 02/27/2015   Diffuse bone pain in legs mostly but also in arms    . Breast pain, left 03/27/2015  . Callus of heel 12/16/2014  . Compression fracture   . Compression fracture of L1 lumbar vertebra (HCC) 08/26/2014  . Compression fracture of T12 vertebra ([place]) 08/28/2014  . COPD (chronic obstructive pulmonary disease) ([place]) 2013  . Cushing syndrome ([place]) 2013   . Depression   . Diabetes ([place]) 2013   . Diabetes mellitus type 2, uncontrolled ([place]) 08/26/2014   Sees Dr. Katy Fitch.  Last visit 11/27/14.  No diabetic retinopathy    . Diabetes mellitus with neuropathy ([place])   . Diastolic dysfunction  without heart failure 10/14/2014   Grade 2 diastolic dysfunction 2/2 pulm HTN   . Generalized anxiety disorder 03/27/2015  . HTN (hypertension) 2005   . Hx of cataract surgery 11/03/2014  . LUQ abdominal pain 07/17/2015  . Medication management 05/12/2015  . Morbid obesity ([employer]) 09/08/2014  . 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 Non-English speaking patient 09/08/2014  . OSA (obstructive sleep apnea) 05/20/2015  . Osteopenia 11/19/2013   Dx with osteopenia in lumbar vertebra with partial compression of L1  . Pain due to onychomycosis of toenail 03/27/2015  . Secondary Cushing's syndrome/ iatrogenic   . Skin rash 07/17/2015  . Tachycardia 01/13/2015  . Upper airway cough syndrome 09/05/2014   Followed in Pulmonary clinic/ [facility]/ [facility]  - Trial off advair      09/05/2014 >>>  - trial off theoph 09/17/2014 and added flutter  - rec reduce dulera to 100 2 bid 07/13/2015     . Vision changes 06/25/2015  . Vitamin D deficiency 08/28/2014    SURGICAL HISTORY: Past Surgical History:  Procedure Laterality Date  . CATARACT EXTRACTION Bilateral 2014  . HAND SURGERY Right 12/18/2015  . VAGINAL HYSTERECTOMY      I have reviewed the social history and family history with the patient and they are unchanged from previous note.  ALLERGIES:  is allergic to hydrocortisone; shellfish allergy; and sulfamethoxazole-trimethoprim.  MEDICATIONS:  Current Outpatient Medications  Medication Sig Dispense Refill  . albuterol (PROVENTIL HFA;VENTOLIN HFA) 108 (90 Base) MCG/ACT inhaler Inhale 2 puffs into the  lungs every 4 (four) hours as needed for wheezing or shortness of breath (((PLAN B))). 18 g 6  . albuterol (PROVENTIL) (2.5 MG/3ML) 0.083% nebulizer solution USE 1 VIAL VIA NEBULIZER EVERY 4 HOURS AS NEEDED FOR WHEEZING OR SHORTNESS OF BREATH (Patient taking differently: Take 2.5 mg by nebulization every 4 (four) hours as needed for wheezing or shortness of breath. ) 90 mL 0  . aspirin EC 81 MG tablet Take 1  tablet (81 mg total) by mouth daily. 90 tablet 3  . BD INSULIN SYRINGE U-500 31G X 6MM 0.5 ML MISC U UTD TID SUBCUTANEOUSLY    . Blood Glucose Monitoring Suppl (ACCU-CHEK AVIVA PLUS) W/DEVICE KIT Used as directed 1 kit 0  . BUTRANS 7.5 MCG/HR PTWK 15 mcg.     . cabergoline (DOSTINEX) 0.5 MG tablet Take 0.5 tablets (0.25 mg total) by mouth 2 (two) times a week. 10 tablet 3  . calcium carbonate (CALCIUM 600) 600 MG TABS tablet Take 1 tablet by mouth daily.    . cholecalciferol (VITAMIN D) 1000 UNITS tablet Take 1,000 Units by mouth every morning.    . cloNIDine (CATAPRES) 0.2 MG tablet Take 0.2 mg by mouth 2 (two) times daily.    . dupilumab (DUPIXENT) 300 MG/2ML prefilled syringe Inject 1 pen/syringe subcutaneously every other week 4 mL 5  . EUCRISA 2 % OINT APP 1 APPLICATION AA BID PRN    . famotidine (PEPCID) 20 MG tablet Take by mouth.    . furosemide (LASIX) 20 MG tablet Take 1 tablet (20 mg total) by mouth every morning. 30 tablet 2  . gabapentin (NEURONTIN) 400 MG capsule Take 2 capsules (800 mg total) by mouth 3 (three) times daily. 180 capsule 3  . glucose blood (RELION GLUCOSE TEST STRIPS) test strip 1 each by Other route 4 (four) times daily. ICD code E11.65 400 each 3  . HUMULIN R U-500 KWIKPEN 500 UNIT/ML kwikpen Inject 120-180 Units into the skin 3 (three) times daily. 180 U before breakfast, 160 U before lunch, 140 U before dinner 27 mL 11  . hydrOXYzine (ATARAX/VISTARIL) 25 MG tablet Take 25 mg by mouth at bedtime as needed.    . Insulin Pen Needle (B-D ULTRAFINE III SHORT PEN) 31G X 8 MM MISC 1 application by Does not apply route daily. 100 each 3  . Insulin Syringe-Needle U-100 (BD INSULIN SYRINGE ULTRAFINE) 31G X 15/64" 1 ML MISC 1 each by Does not apply route 3 (three) times daily. 300 each 3  . Lancet Devices (ACCU-CHEK SOFTCLIX) lancets Use as instructed 1 each 0  . levothyroxine (SYNTHROID, LEVOTHROID) 75 MCG tablet TK 1 T PO QD IN THE MORNING OES    . losartan (COZAAR) 100  MG tablet Take 1 tablet (100 mg total) by mouth every morning. 30 tablet 6  . methocarbamol (ROBAXIN) 750 MG tablet Take 1 tablet (750 mg total) by mouth 3 (three) times daily. MUST MAKE APPT FOR FURTHER REFILLS 60 tablet 0  . Misc. Devices (CANE) MISC 1 each by Does not apply route daily. 1 each 0  . mometasone-formoterol (DULERA) 200-5 MCG/ACT AERO Inhale 2 puffs into the lungs 2 (two) times daily. 8.8 g 3  . montelukast (SINGULAIR) 10 MG tablet Take 1 tablet (10 mg total) by mouth at bedtime. 30 tablet 6  . NARCAN 4 MG/0.1ML LIQD nasal spray kit U UTS PRN    . omeprazole (PRILOSEC) 20 MG capsule Take 20 mg by mouth daily.    Marland Kitchen PARoxetine (PAXIL) 20 MG  tablet Take 1 tablet (20 mg total) by mouth daily. MUST MAKE APPT FOR FURTHER REFILLS 30 tablet 1  . predniSONE (DELTASONE) 1 MG tablet Take 2 tablets (2 mg total) by mouth daily. 60 tablet 1  . ReliOn Ultra Thin Lancets MISC 1 each by Does not apply route 4 (four) times daily. ICD code E11.65 400 each 3  . Respiratory Therapy Supplies (FLUTTER) DEVI Use as directed 1 each 0  . rosuvastatin (CRESTOR) 20 MG tablet Take 1 tablet (20 mg total) by mouth daily. 90 tablet 3  . Spacer/Aero-Holding Chambers (AEROCHAMBER MV) inhaler Use as instructed 1 each 0  . Tiotropium Bromide Monohydrate (SPIRIVA RESPIMAT) 1.25 MCG/ACT AERS Inhale 2 puffs into the lungs daily. 2 g 0  . Tiotropium Bromide Monohydrate (SPIRIVA RESPIMAT) 1.25 MCG/ACT AERS Inhale 2 puffs into the lungs daily. 4 g 5  . traZODone (DESYREL) 50 MG tablet Take 50 mg by mouth at bedtime.    Marland Kitchen ULTICARE MICRO PEN NEEDLES 32G X 4 MM MISC USE AS DIRECTED 100 each 2  . verapamil (CALAN-SR) 180 MG CR tablet Take 2 tablets (360 mg total) by mouth at bedtime. 180 tablet 2  . Vitamin D, Ergocalciferol, (DRISDOL) 1.25 MG (50000 UT) CAPS capsule Take by mouth.    . nitroGLYCERIN (NITROSTAT) 0.4 MG SL tablet Place 1 tablet (0.4 mg total) under the tongue every 5 (five) minutes as needed. 25 tablet 3    Current Facility-Administered Medications  Medication Dose Route Frequency Provider Last Rate Last Admin  . betamethasone acetate-betamethasone sodium phosphate (CELESTONE) injection 3 mg  3 mg Intramuscular Once Evans, Brent M, DPM      . umeclidinium bromide (INCRUSE ELLIPTA) 62.5 MCG/INH 1 puff  1 puff Inhalation Daily Ladell Pier, MD       Facility-Administered Medications Ordered in Other Visits  Medication Dose Route Frequency Provider Last [place]  . DOBUTamine (DOBUTREX) 1,000 mcg/mL in dextrose 5% 250 mL infusion  30 mcg/kg/min Intravenous Continuous Croitoru, Mihai, MD 145.3 mL/hr at 04/22/15 1300 30 mcg/kg/min at 04/22/15 1300    PHYSICAL EXAMINATION: ECOG PERFORMANCE STATUS: 1 - Symptomatic but completely ambulatory  Vitals:   09/16/19 0932  BP: 126/66  Pulse: 88  Resp: 16  Temp: 97.8 F (36.6 C)  SpO2: 97%   Filed Weights   09/16/19 0932  Weight: 196 lb 8 oz (89.1 kg)    GENERAL:alert, no distress and comfortable SKIN: no rash  EYES: sclera clear LUNGS: clear with normal breathing effort HEART: regular rate & rhythm ABDOMEN: obese. abdomen soft, non-tender and normal bowel sounds Musculoskeletal: ambulates independently with cane  NEURO: alert & oriented x 3 with fluent speech  LABORATORY DATA:  I have reviewed the data as listed CBC Latest Ref Rng & Units 09/16/2019 04/15/2019 03/13/2019  WBC 4.0 - 10.5 K/uL 10.8(H) 12.1(H) 11.3(H)  Hemoglobin 12.0 - 15.0 g/dL 11.0(L) 11.8 12.3  Hematocrit 36.0 - 46.0 % 34.8(L) 36.4 38.4  Platelets 150 - 400 K/uL 398 451(H) 411(H)     RADIOGRAPHIC STUDIES: I have personally reviewed the radiological images as listed and agreed with the findings in the report. No results found.   ASSESSMENT & PLAN: 49 yo female with   1. Iron deficiency anemia, likely related to menorrhagia  -on 03/13/19, ferritin 53, serum iron 41, 13% transferrin saturation, TIBC 308. Hg was normal at 12.3. She began oral iron every other  day per Dr. Maylon Peppers -she continues to have monthly menses lasting 5-7 days, heavy for 4  days. Menstrual bleeding is heavier if hypothyroidism is worse  -she developed worsening fatigue in 08/2019 and increased oral iron to once daily per PCP without significant improvement -[name] appears stable today. Labs show mild anemia Hgb 11.0. thrombocytosis resolved. Mild leukocytosis is stable, on prednisone. -Iron studies reveal ferritin 21, serum iron 27, 7% transferrin saturation.  -She is not responding to oral iron. I recommend IV Feraheme weekly x2. She has tolerated this in the past.  -Will repeat labs in 3 and 6 months, f/u in 6 months   2. General health maintenance  -I encouraged her to remain UTD on age-appropriate cancer screenings including mammogram, PAP smear, and colonoscopy. I informed her that we offer community screenings for certain cancers. She agrees.   PLAN: -labs reviewed -IV Feraheme weekly x2 -lab in 3, 6 months -f/u with Dr. Maylon Peppers in 6 months   No problem-specific Assessment & Plan notes found for this encounter.   No orders of the defined types were placed in this encounter.  All questions were answered. The patient knows to call the clinic with any problems, questions or concerns. No barriers to learning were detected with utilization of Spanish interpreter. I spent 20 minutes counseling the patient face to face. The total time spent in the appointment was 25 minutes and more than 50% was on counseling and review of test results.     Alla Feeling, NP 09/16/19

## 2019-09-16 ENCOUNTER — Inpatient Hospital Stay: Payer: Medicaid Other | Attending: Nurse Practitioner

## 2019-09-16 ENCOUNTER — Other Ambulatory Visit: Payer: Self-pay

## 2019-09-16 ENCOUNTER — Encounter: Payer: Self-pay | Admitting: Nurse Practitioner

## 2019-09-16 ENCOUNTER — Inpatient Hospital Stay: Payer: Medicaid Other | Admitting: Internal Medicine

## 2019-09-16 ENCOUNTER — Inpatient Hospital Stay (HOSPITAL_BASED_OUTPATIENT_CLINIC_OR_DEPARTMENT_OTHER): Payer: Medicaid Other | Admitting: Nurse Practitioner

## 2019-09-16 VITALS — BP 126/66 | HR 88 | Temp 97.8°F | Resp 16 | Ht <= 58 in | Wt 196.5 lb

## 2019-09-16 DIAGNOSIS — F329 Major depressive disorder, single episode, unspecified: Secondary | ICD-10-CM | POA: Insufficient documentation

## 2019-09-16 DIAGNOSIS — Z90710 Acquired absence of both cervix and uterus: Secondary | ICD-10-CM | POA: Insufficient documentation

## 2019-09-16 DIAGNOSIS — J449 Chronic obstructive pulmonary disease, unspecified: Secondary | ICD-10-CM | POA: Insufficient documentation

## 2019-09-16 DIAGNOSIS — D500 Iron deficiency anemia secondary to blood loss (chronic): Secondary | ICD-10-CM

## 2019-09-16 DIAGNOSIS — G4733 Obstructive sleep apnea (adult) (pediatric): Secondary | ICD-10-CM | POA: Insufficient documentation

## 2019-09-16 DIAGNOSIS — Z7951 Long term (current) use of inhaled steroids: Secondary | ICD-10-CM | POA: Diagnosis not present

## 2019-09-16 DIAGNOSIS — Z79899 Other long term (current) drug therapy: Secondary | ICD-10-CM | POA: Insufficient documentation

## 2019-09-16 DIAGNOSIS — D509 Iron deficiency anemia, unspecified: Secondary | ICD-10-CM | POA: Insufficient documentation

## 2019-09-16 DIAGNOSIS — E1140 Type 2 diabetes mellitus with diabetic neuropathy, unspecified: Secondary | ICD-10-CM | POA: Diagnosis not present

## 2019-09-16 DIAGNOSIS — Z794 Long term (current) use of insulin: Secondary | ICD-10-CM | POA: Insufficient documentation

## 2019-09-16 DIAGNOSIS — I10 Essential (primary) hypertension: Secondary | ICD-10-CM | POA: Diagnosis not present

## 2019-09-16 DIAGNOSIS — Z7982 Long term (current) use of aspirin: Secondary | ICD-10-CM | POA: Diagnosis not present

## 2019-09-16 DIAGNOSIS — Z7952 Long term (current) use of systemic steroids: Secondary | ICD-10-CM | POA: Diagnosis not present

## 2019-09-16 DIAGNOSIS — D72829 Elevated white blood cell count, unspecified: Secondary | ICD-10-CM | POA: Insufficient documentation

## 2019-09-16 LAB — CBC WITH DIFFERENTIAL (CANCER CENTER ONLY)
Abs Immature Granulocytes: 0.03 10*3/uL (ref 0.00–0.07)
Basophils Absolute: 0.1 10*3/uL (ref 0.0–0.1)
Basophils Relative: 1 %
Eosinophils Absolute: 0.5 10*3/uL (ref 0.0–0.5)
Eosinophils Relative: 5 %
HCT: 34.8 % — ABNORMAL LOW (ref 36.0–46.0)
Hemoglobin: 11 g/dL — ABNORMAL LOW (ref 12.0–15.0)
Immature Granulocytes: 0 %
Lymphocytes Relative: 37 %
Lymphs Abs: 4 10*3/uL (ref 0.7–4.0)
MCH: 26.3 pg (ref 26.0–34.0)
MCHC: 31.6 g/dL (ref 30.0–36.0)
MCV: 83.3 fL (ref 80.0–100.0)
Monocytes Absolute: 0.7 10*3/uL (ref 0.1–1.0)
Monocytes Relative: 7 %
Neutro Abs: 5.5 10*3/uL (ref 1.7–7.7)
Neutrophils Relative %: 50 %
Platelet Count: 398 10*3/uL (ref 150–400)
RBC: 4.18 MIL/uL (ref 3.87–5.11)
RDW: 14.6 % (ref 11.5–15.5)
WBC Count: 10.8 10*3/uL — ABNORMAL HIGH (ref 4.0–10.5)
nRBC: 0 % (ref 0.0–0.2)

## 2019-09-16 LAB — FERRITIN: Ferritin: 21 ng/mL (ref 11–307)

## 2019-09-16 LAB — IRON AND TIBC
Iron: 27 ug/dL — ABNORMAL LOW (ref 41–142)
Saturation Ratios: 7 % — ABNORMAL LOW (ref 21–57)
TIBC: 371 ug/dL (ref 236–444)
UIBC: 344 ug/dL (ref 120–384)

## 2019-09-16 LAB — SAVE SMEAR(SSMR), FOR PROVIDER SLIDE REVIEW

## 2019-09-17 ENCOUNTER — Telehealth: Payer: Self-pay | Admitting: Nurse Practitioner

## 2019-09-17 NOTE — Telephone Encounter (Signed)
I talk with patient regarding schedule but could not schedule zhao appt his schedule is not available

## 2019-09-19 ENCOUNTER — Other Ambulatory Visit: Payer: Self-pay | Admitting: Pulmonary Disease

## 2019-09-19 ENCOUNTER — Ambulatory Visit: Payer: Medicaid Other

## 2019-09-19 ENCOUNTER — Other Ambulatory Visit: Payer: Self-pay | Admitting: Nurse Practitioner

## 2019-09-20 ENCOUNTER — Inpatient Hospital Stay: Payer: Medicaid Other

## 2019-09-20 ENCOUNTER — Other Ambulatory Visit: Payer: Self-pay

## 2019-09-20 VITALS — BP 118/70 | HR 83 | Temp 98.2°F | Resp 16

## 2019-09-20 DIAGNOSIS — D509 Iron deficiency anemia, unspecified: Secondary | ICD-10-CM

## 2019-09-20 MED ORDER — SODIUM CHLORIDE 0.9 % IV SOLN
510.0000 mg | Freq: Once | INTRAVENOUS | Status: AC
  Administered 2019-09-20: 510 mg via INTRAVENOUS
  Filled 2019-09-20: qty 510

## 2019-09-20 MED ORDER — SODIUM CHLORIDE 0.9 % IV SOLN
Freq: Once | INTRAVENOUS | Status: AC
  Filled 2019-09-20: qty 250

## 2019-09-20 NOTE — Patient Instructions (Addendum)
Ferumoxytol injection Qu es este medicamento? El FERUMOXITOL es un complejo de hierro. El hierro se [place] para producir glbulos rojos saludables, que transportan oxgeno y nutrientes por el cuerpo. Este medicamento se [place] para tratar la anemia por deficiencia de hierro. Este medicamento puede ser utilizado para otros usos; si tiene alguna pregunta consulte con su proveedor de atencin mdica o con su farmacutico. MARCAS COMUNES: Feraheme Qu le debo informar a mi profesional de la salud antes de tomar este medicamento? Necesita saber si usted presenta alguno de los [employer] o situaciones:  anemia no provocada por niveles bajos de hierro  niveles altos de hierro en la sangre  examen de imgenes por Health visitor (IRM) programado  una reaccin alrgica o inusual al hierro, otros medicamentos, alimentos, colorantes o conservantes  si est embarazada o buscando quedar embarazada  si est amamantando a un beb Cmo debo Insurance account manager medicamento? Este medicamento se administra mediante inyeccin por va intravenosa. Lo administra un profesional de Technical sales engineer en un hospital o en un entorno clnico. Hable con su pediatra para informarse acerca del uso de este medicamento en nios. Puede requerir atencin especial. Sobredosis: Pngase en contacto inmediatamente con un centro toxicolgico o una sala de urgencia si usted cree que haya tomado demasiado medicamento. ATENCIN: [employer] es solo para usted. No comparta este medicamento con nadie. Qu sucede si me olvido de una dosis? Es importante no olvidar ninguna dosis. Informe a su mdico o a su profesional de la salud si no puede asistir a Photographer. Qu puede interactuar con este medicamento? Esta medicina puede interactuar con los siguientes medicamentos:  otros productos de hierro Puede ser que esta lista no menciona todas las posibles interacciones. Informe a su profesional de [employer]	[place] de [employer] productos a  base de hierbas, medicamentos de [place] o suplementos nutritivos que est tomando. Si usted fuma, consume bebidas alcohlicas o si utiliza drogas ilegales, indqueselo tambin a su profesional de [employer]	[place]. Algunas sustancias pueden interactuar con su medicamento. A qu debo estar atento al usar [employer]? Visite a su mdico o a su profesional de la salud de [place] regular. Si los sntomas no comienzan a mejorar o si empeoran, consulte con su mdico o con su profesional de [employer]	[place]. Tal vez necesita realizarse anlisis de sangre mientras recibe [place]. Tal vez necesita seguir Counselling psychologist. Consulte a su mdico. Los alimentos que contienen hierro incluyen: alimentos integrales o con cereales, frutas secas, frijoles o arvejas, vegetales de hoja verde y carne que proviene de rganos (hgado, rin). Qu efectos secundarios puedo tener al [employer] este medicamento? Efectos secundarios que debe informar a su mdico o a Barrister's clerk de la salud tan pronto como sea posible: Chief of Staff, como erupcin cutnea, picazn o urticarias, e hinchazn de la cara, los labios o la lengua problemas respiratorios cambios en la presin sangunea sensacin de desmayos o aturdimiento, cadas fiebre o escalofros enrojecimiento, sudoracin o sensacin de calor hinchazn de tobillos o pies Efectos secundarios que generalmente no requieren atencin mdica (infrmelos a su mdico o a Barrister's clerk de la salud si persisten o si son molestos): diarrea dolor de cabeza nuseas, vmito dolor estomacal Puede ser que esta lista no menciona todos los posibles efectos secundarios. Comunquese a su mdico por asesoramiento mdico [employer] Usted puede informar los efectos secundarios a la FDA por telfono al 1-800-FDA-1088. Dnde debo guardar mi medicina? Este medicamento se administra en hospitales o clnicas y no Sports coach  en su domicilio. ATENCIN: Este folleto es un  resumen. Puede ser que no cubra toda la posible informacin. Si usted tiene preguntas acerca de esta medicina, consulte con su mdico, su farmacutico o su profesional de Technical sales engineer.  2020 Elsevier/Gold Standard (2016-12-20 00:00:00)

## 2019-09-24 ENCOUNTER — Other Ambulatory Visit: Payer: Medicaid Other

## 2019-09-26 ENCOUNTER — Inpatient Hospital Stay: Payer: Medicaid Other

## 2019-09-27 ENCOUNTER — Other Ambulatory Visit: Payer: Self-pay | Admitting: Nurse Practitioner

## 2019-09-27 ENCOUNTER — Other Ambulatory Visit: Payer: Self-pay

## 2019-09-27 ENCOUNTER — Ambulatory Visit
Admission: RE | Admit: 2019-09-27 | Discharge: 2019-09-27 | Disposition: A | Discharge status: Home / Self Care | Payer: Medicaid Other | Source: Ambulatory Visit | Attending: Nurse Practitioner | Admitting: Nurse Practitioner

## 2019-09-27 ENCOUNTER — Telehealth: Payer: Self-pay | Admitting: Internal Medicine

## 2019-09-27 DIAGNOSIS — N6320 Unspecified lump in the left breast, unspecified quadrant: Secondary | ICD-10-CM

## 2019-09-27 DIAGNOSIS — N6310 Unspecified lump in the right breast, unspecified quadrant: Secondary | ICD-10-CM

## 2019-09-27 NOTE — Telephone Encounter (Signed)
Returned patient's phone call regarding rescheduling 01/14 appointment, per patient's request appointment has moved to 01/18.

## 2019-09-30 ENCOUNTER — Other Ambulatory Visit: Payer: Self-pay

## 2019-09-30 ENCOUNTER — Inpatient Hospital Stay: Payer: Medicaid Other

## 2019-09-30 VITALS — BP 112/76 | HR 70 | Temp 98.2°F | Resp 18

## 2019-09-30 DIAGNOSIS — D509 Iron deficiency anemia, unspecified: Secondary | ICD-10-CM

## 2019-09-30 MED ORDER — SODIUM CHLORIDE 0.9 % IV SOLN
Freq: Once | INTRAVENOUS | Status: AC
  Filled 2019-09-30: qty 250

## 2019-09-30 MED ORDER — SODIUM CHLORIDE 0.9 % IV SOLN
510.0000 mg | Freq: Once | INTRAVENOUS | Status: AC
  Administered 2019-09-30: 510 mg via INTRAVENOUS
  Filled 2019-09-30: qty 510

## 2019-09-30 NOTE — Patient Instructions (Signed)
Ferumoxytol injection Qu es este medicamento? El FERUMOXITOL es un complejo de hierro. El hierro se [place] para producir glbulos rojos saludables, que transportan oxgeno y nutrientes por el cuerpo. Este medicamento se [place] para tratar la anemia por deficiencia de hierro. Este medicamento puede ser utilizado para otros usos; si tiene alguna pregunta consulte con su proveedor de atencin mdica o con su farmacutico. MARCAS COMUNES: Feraheme Qu le debo informar a mi profesional de la salud antes de tomar este medicamento? Necesita saber si usted presenta alguno de los [employer] o situaciones:  anemia no provocada por niveles bajos de hierro  niveles altos de hierro en la sangre  examen de imgenes por Health visitor (IRM) programado  una reaccin alrgica o inusual al hierro, otros medicamentos, alimentos, colorantes o conservantes  si est embarazada o buscando quedar embarazada  si est amamantando a un beb Cmo debo Insurance account manager medicamento? Este medicamento se administra mediante inyeccin por va intravenosa. Lo administra un profesional de Technical sales engineer en un hospital o en un entorno clnico. Hable con su pediatra para informarse acerca del uso de este medicamento en nios. Puede requerir atencin especial. Sobredosis: Pngase en contacto inmediatamente con un centro toxicolgico o una sala de urgencia si usted cree que haya tomado demasiado medicamento. ATENCIN: [employer] es solo para usted. No comparta este medicamento con nadie. Qu sucede si me olvido de una dosis? Es importante no olvidar ninguna dosis. Informe a su mdico o a su profesional de la salud si no puede asistir a Photographer. Qu puede interactuar con este medicamento? Esta medicina puede interactuar con los siguientes medicamentos:  otros productos de hierro Puede ser que esta lista no menciona todas las posibles interacciones. Informe a su profesional de [employer]	[place] de [employer] productos a  base de hierbas, medicamentos de [place] o suplementos nutritivos que est tomando. Si usted fuma, consume bebidas alcohlicas o si utiliza drogas ilegales, indqueselo tambin a su profesional de [employer]	[place]. Algunas sustancias pueden interactuar con su medicamento. A qu debo estar atento al usar [employer]? Visite a su mdico o a su profesional de la salud de [place] regular. Si los sntomas no comienzan a mejorar o si empeoran, consulte con su mdico o con su profesional de [employer]	[place]. Tal vez necesita realizarse anlisis de sangre mientras recibe [place]. Tal vez necesita seguir Counselling psychologist. Consulte a su mdico. Los alimentos que contienen hierro incluyen: alimentos integrales o con cereales, frutas secas, frijoles o arvejas, vegetales de hoja verde y carne que proviene de rganos (hgado, rin). Qu efectos secundarios puedo tener al [employer] este medicamento? Efectos secundarios que debe informar a su mdico o a Barrister's clerk de la salud tan pronto como sea posible: Chief of Staff, como erupcin cutnea, picazn o urticarias, e hinchazn de la cara, los labios o la lengua problemas respiratorios cambios en la presin sangunea sensacin de desmayos o aturdimiento, cadas fiebre o escalofros enrojecimiento, sudoracin o sensacin de calor hinchazn de tobillos o pies Efectos secundarios que generalmente no requieren atencin mdica (infrmelos a su mdico o a Barrister's clerk de la salud si persisten o si son molestos): diarrea dolor de cabeza nuseas, vmito dolor estomacal Puede ser que esta lista no menciona todos los posibles efectos secundarios. Comunquese a su mdico por asesoramiento mdico [employer] Usted puede informar los efectos secundarios a la FDA por telfono al 1-800-FDA-1088. Dnde debo guardar mi medicina? Este medicamento se administra en hospitales o clnicas y no Sports coach  en su domicilio. ATENCIN: Este folleto es un  resumen. Puede ser que no cubra toda la posible informacin. Si usted tiene preguntas acerca de esta medicina, consulte con su mdico, su farmacutico o su profesional de Technical sales engineer.  2020 Elsevier/Gold Standard (2016-12-20 00:00:00)

## 2019-10-08 ENCOUNTER — Other Ambulatory Visit: Payer: Self-pay | Admitting: Nurse Practitioner

## 2019-11-26 ENCOUNTER — Telehealth: Payer: Self-pay | Admitting: Pulmonary Disease

## 2019-11-26 NOTE — Telephone Encounter (Signed)
Spoke with the pt  She is asking for a letter to be written stating her severe dx of asthma so she can get her covid vaccine  Please advise thanks!

## 2019-11-27 ENCOUNTER — Encounter: Payer: Self-pay | Admitting: *Deleted

## 2019-11-27 NOTE — Telephone Encounter (Signed)
Yes, please provide letter for patient with the following diagnosis for vaccination:  Severe persistent asthma

## 2019-11-27 NOTE — Telephone Encounter (Signed)
Called and spoke with Patient, via Administrator, sports.   Patient made aware of letter for covid vaccine, with severe persistent asthma diagnosis.  Understanding stated. Patient stated she would come by office and pick up letter this week. Letter printed, placed in sealed envelope,and placed at front desk for patient pick up.  Nothing further at this time.

## 2019-12-02 ENCOUNTER — Telehealth: Payer: Self-pay | Admitting: Pulmonary Disease

## 2019-12-02 ENCOUNTER — Ambulatory Visit: Payer: Medicaid Other | Admitting: Internal Medicine

## 2019-12-02 MED ORDER — PREDNISONE 1 MG PO TABS: 2.0000 mg | ORAL_TABLET | Freq: Every day | ORAL | 1 refills | Status: DC

## 2019-12-02 NOTE — Telephone Encounter (Signed)
Instructions    Return in about 3 months (around 12/01/2019). Severe Persistent Asthma CONTINUE Dulera 200-5 TWO puffs TWICE a day START Spiriva 1.5 mcg TWO puffs ONCE a day CONTINUE Albuterol as needed for shortness of breath or wheezing CONTINUE Singulair ONCE a day CONTINUE 2 mg prednisone every day. We will consider cutting back in the future CONTINUE Dupixent as scheduled  We will send letter to your PCP Warnell Forester, NP) regarding Prolia. After discussing with our Pulmonary pharmacy team, there is no contraindication to continue Prolia with her current pulmonary regimen.     Called and spoke with pt to verify preferred pharmacy. Sent med to pharmacy for pt. nothing further needed.

## 2019-12-11 ENCOUNTER — Encounter (HOSPITAL_COMMUNITY): Payer: Self-pay

## 2019-12-11 ENCOUNTER — Emergency Department (HOSPITAL_COMMUNITY): Payer: Medicaid Other

## 2019-12-11 ENCOUNTER — Emergency Department (HOSPITAL_COMMUNITY)
Admission: EM | Admit: 2019-12-11 | Discharge: 2019-12-12 | Disposition: A | Discharge status: Home / Self Care | Payer: Medicaid Other | Attending: Emergency Medicine | Admitting: Emergency Medicine

## 2019-12-11 DIAGNOSIS — Z7982 Long term (current) use of aspirin: Secondary | ICD-10-CM | POA: Insufficient documentation

## 2019-12-11 DIAGNOSIS — E1140 Type 2 diabetes mellitus with diabetic neuropathy, unspecified: Secondary | ICD-10-CM | POA: Diagnosis not present

## 2019-12-11 DIAGNOSIS — R42 Dizziness and giddiness: Secondary | ICD-10-CM | POA: Insufficient documentation

## 2019-12-11 DIAGNOSIS — Z79899 Other long term (current) drug therapy: Secondary | ICD-10-CM | POA: Insufficient documentation

## 2019-12-11 DIAGNOSIS — R519 Headache, unspecified: Secondary | ICD-10-CM | POA: Diagnosis not present

## 2019-12-11 DIAGNOSIS — I10 Essential (primary) hypertension: Secondary | ICD-10-CM | POA: Insufficient documentation

## 2019-12-11 DIAGNOSIS — J449 Chronic obstructive pulmonary disease, unspecified: Secondary | ICD-10-CM | POA: Insufficient documentation

## 2019-12-11 DIAGNOSIS — Z794 Long term (current) use of insulin: Secondary | ICD-10-CM | POA: Insufficient documentation

## 2019-12-11 LAB — COMPREHENSIVE METABOLIC PANEL
ALT: 21 U/L (ref 0–44)
AST: 18 U/L (ref 15–41)
Albumin: 3.7 g/dL (ref 3.5–5.0)
Alkaline Phosphatase: 80 U/L (ref 38–126)
Anion gap: 9 (ref 5–15)
BUN: 14 mg/dL (ref 6–20)
CO2: 26 mmol/L (ref 22–32)
Calcium: 9.4 mg/dL (ref 8.9–10.3)
Chloride: 104 mmol/L (ref 98–111)
Creatinine, Ser: 0.95 mg/dL (ref 0.44–1.00)
GFR calc Af Amer: >60 mL/min (ref >60)
GFR calc non Af Amer: >60 mL/min (ref >60)
Glucose, Bld: 129 mg/dL — ABNORMAL HIGH (ref 70–99)
Potassium: 4.2 mmol/L (ref 3.5–5.1)
Sodium: 139 mmol/L (ref 135–145)
Total Bilirubin: 0.5 mg/dL (ref 0.3–1.2)
Total Protein: 7.8 g/dL (ref 6.5–8.1)

## 2019-12-11 LAB — CBC WITH DIFFERENTIAL/PLATELET
Abs Immature Granulocytes: 0.03 10*3/uL (ref 0.00–0.07)
Basophils Absolute: 0.1 10*3/uL (ref 0.0–0.1)
Basophils Relative: 1 %
Eosinophils Absolute: 0.3 10*3/uL (ref 0.0–0.5)
Eosinophils Relative: 3 %
HCT: 41.1 % (ref 36.0–46.0)
Hemoglobin: 13.1 g/dL (ref 12.0–15.0)
Immature Granulocytes: 0 %
Lymphocytes Relative: 32 %
Lymphs Abs: 3.1 10*3/uL (ref 0.7–4.0)
MCH: 28 pg (ref 26.0–34.0)
MCHC: 31.9 g/dL (ref 30.0–36.0)
MCV: 87.8 fL (ref 80.0–100.0)
Monocytes Absolute: 0.8 10*3/uL (ref 0.1–1.0)
Monocytes Relative: 8 %
Neutro Abs: 5.6 10*3/uL (ref 1.7–7.7)
Neutrophils Relative %: 56 %
Platelets: 395 10*3/uL (ref 150–400)
RBC: 4.68 MIL/uL (ref 3.87–5.11)
RDW: 16.8 % — ABNORMAL HIGH (ref 11.5–15.5)
WBC: 9.9 10*3/uL (ref 4.0–10.5)
nRBC: 0 % (ref 0.0–0.2)

## 2019-12-11 LAB — SAMPLE TO BLOOD BANK

## 2019-12-11 NOTE — Discharge Instructions (Signed)
Please schedule follow-up appointment with your primary doctor regarding the symptoms you are experiencing today.  Return to ER if you develop worsening dizziness, chest pain, difficulty breathing, numbness, weakness, vision changes or other new concerning symptom.

## 2019-12-11 NOTE — ED Notes (Signed)
Pt went to c-t 

## 2019-12-11 NOTE — ED Triage Notes (Signed)
Pt arrives POV for eval of dizziness x 6 days. Reports she tried to get into PCP, but was not able to get appt. Pt denies pain, states dizziness is mostly when standing. Reports last time she felt this way, she was anemic. Denies known blood loss, or etiology

## 2019-12-12 ENCOUNTER — Other Ambulatory Visit: Payer: Self-pay | Admitting: Medical Oncology

## 2019-12-12 DIAGNOSIS — D509 Iron deficiency anemia, unspecified: Secondary | ICD-10-CM

## 2019-12-12 NOTE — ED Provider Notes (Signed)
[facility] EMERGENCY DEPARTMENT Provider Note   CSN: [id number] Arrival date & time: 12/11/19  1938     History Chief Complaint  Patient presents with  . Dizziness    Raven Turner is a 49 y.o. female.  Presents to ER with complaint of dizziness.  Over the past 6 days she has been having intermittent episodes of dizzy sensation.  She is states it seems to be worse when standing, with movement.  Had similar symptoms when she was told that she was very anemic.  No recent blood in stools, vagina, vomit.  She does not have any room spinning sensation, when trying to clarify dizziness, states that it seems to be more on the left side of her head that she has a slight woozy feeling.  She does not have any ongoing symptoms at this time.  She has never had any associated vision changes, eye pain, speech changes, numbness, weakness.   Used language line translator.  HPI     Past Medical History:  Diagnosis Date  . Anemia   . Asthma   . Bilateral hand numbness 06/25/2015  . Bone pain 02/27/2015   Diffuse bone pain in legs mostly but also in arms    . Breast pain, left 03/27/2015  . Callus of heel 12/16/2014  . Compression fracture   . Compression fracture of L1 lumbar vertebra (HCC) 08/26/2014  . Compression fracture of T12 vertebra (HCC) 08/28/2014  . COPD (chronic obstructive pulmonary disease) (HCC) 2013  . Cushing syndrome (HCC) 2013   . Depression   . Diabetes (HCC) 2013   . Diabetes mellitus type 2, uncontrolled (HCC) 08/26/2014   Sees Dr. Groat.  Last visit 11/27/14.  No diabetic retinopathy    . Diabetes mellitus with neuropathy (HCC)   . Diastolic dysfunction without heart failure 10/14/2014   Grade 2 diastolic dysfunction 2/2 pulm HTN   . Generalized anxiety disorder 03/27/2015  . HTN (hypertension) 2005   . Hx of cataract surgery 11/03/2014  . LUQ abdominal pain 07/17/2015  . Medication management 05/12/2015  . Morbid obesity (HCC) 09/08/2014  .  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 Non-English speaking patient 09/08/2014  . OSA (obstructive sleep apnea) 05/20/2015  . Osteopenia 11/19/2013   Dx with osteopenia in lumbar vertebra with partial compression of L1  . Pain due to onychomycosis of toenail 03/27/2015  . Secondary Cushing's syndrome/ iatrogenic   . Skin rash 07/17/2015  . Tachycardia 01/13/2015  . Upper airway cough syndrome 09/05/2014   Followed in Pulmonary clinic/ [facility]/ [facility]  - Trial off advair      09/05/2014 >>>  - trial off theoph 09/17/2014 and added flutter  - rec reduce dulera to 100 2 bid 07/13/2015     . Vision changes 06/25/2015  . Vitamin D deficiency 08/28/2014    Patient Active Problem List   Diagnosis Date Noted  . Severe persistent asthma without complication 09/02/2019  . Menorrhagia 09/11/2018  . Dizziness 09/11/2018  . Iron deficiency anemia 06/25/2018  . Anemia 05/09/2018  . 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  . Elevated prolactin level 01/19/2017  . Microalbuminuria due to type 2 diabetes mellitus (HCC) 12/19/2016  . Elevated TSH 12/19/2016  . Menstrual changes 12/19/2016  . Flaking of scalp 07/18/2016  . Acral type peeling skin syndrome 07/18/2016  . Non-traumatic compression fracture of vertebral column (HCC) 01/20/2016  . Bilateral carpal tunnel syndrome 12/21/2015  . Chronic back pain 11/05/2015  . NASH (nonalcoholic steatohepatitis) 09/25/2015  . Bilateral   hand numbness 06/25/2015  . OSA (obstructive sleep apnea) 05/20/2015  . Generalized anxiety disorder 03/27/2015  . Breast pain, left 03/27/2015  . Callus of heel 12/16/2014  . 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 Hx of cataract surgery 11/03/2014  . Diastolic dysfunction without heart failure 10/14/2014  . Morbid obesity ([place]) 09/08/2014  . Non-English speaking patient 09/08/2014  . Upper airway cough syndrome 09/05/2014  . Vitamin D deficiency 08/28/2014  . Compression fracture of T12 vertebra ([place]) 08/28/2014  . Diabetes  mellitus type 2, uncontrolled ([place]) 08/26/2014  . Compression fracture of L1 lumbar vertebra (HCC) 08/26/2014  . Essential (primary) hypertension   . Secondary Cushing's syndrome/ iatrogenic   . 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 CATARACT EXTRACTION Bilateral 2014  . HAND SURGERY Right 12/18/2015  . VAGINAL HYSTERECTOMY       OB History   No obstetric history on file.     Family History  Problem Relation Age of Onset  . Diabetes Mother   . Stroke Father   . Asthma Father   . Hypertension Sister   . Hypertension Brother     Social History   Tobacco Use  . Smoking status: Never Smoker  . Smokeless tobacco: Never Used  Substance Use Topics  . Alcohol use: No    Alcohol/week: 0.0 standard drinks  . Drug use: No    Home Medications Prior to Admission medications   Medication Sig Start Date End Date Taking? Authorizing Provider  albuterol (PROVENTIL HFA;VENTOLIN HFA) 108 (90 Base) MCG/ACT inhaler Inhale 2 puffs into the lungs every 4 (four) hours as needed for wheezing or shortness of breath (((PLAN B))). 09/19/18   Juanito Doom, MD  albuterol (PROVENTIL) (2.5 MG/3ML) 0.083% nebulizer solution USE 1 VIAL VIA NEBULIZER EVERY 4 HOURS AS NEEDED FOR WHEEZING OR SHORTNESS OF BREATH Patient taking differently: Take 2.5 mg by nebulization every 4 (four) hours as needed for wheezing or shortness of breath.  10/31/17   Ladell Pier, MD  aspirin EC 81 MG tablet Take 1 tablet (81 mg total) by mouth daily. 10/31/17   Ladell Pier, MD  BD INSULIN SYRINGE U-500 31G X 6MM 0.5 ML MISC U UTD TID SUBCUTANEOUSLY 12/01/18   [provider]  Blood Glucose Monitoring Suppl (ACCU-CHEK AVIVA PLUS) W/DEVICE KIT Used as directed 11/05/14   Boykin Nearing, MD  BUTRANS 7.5 MCG/HR PTWK 15 mcg.  11/23/18   [provider]  cabergoline (DOSTINEX) 0.5 MG tablet Take 0.5 tablets (0.25 mg total) by mouth 2 (two)  times a week. 02/05/18   Ladell Pier, MD  calcium carbonate (CALCIUM 600) 600 MG TABS tablet Take 1 tablet by mouth daily.    [provider]  cholecalciferol (VITAMIN D) 1000 UNITS tablet Take 1,000 Units by mouth every morning.    [provider]  cloNIDine (CATAPRES) 0.2 MG tablet Take 0.2 mg by mouth 2 (two) times daily.    [provider]  dupilumab (DUPIXENT) 300 MG/2ML prefilled syringe Inject 1 pen/syringe subcutaneously every other week 08/26/19   Margaretha Seeds, MD  EUCRISA 2 % OINT APP 1 APPLICATION AA BID PRN 2/54/27   [provider]  famotidine (PEPCID) 20 MG tablet Take by mouth.    [provider]  furosemide (LASIX) 20 MG tablet Take 1 tablet (20 mg total) by mouth every morning. 10/31/17   Ladell Pier, MD  gabapentin (NEURONTIN) 400 MG capsule Take 2 capsules (800  mg total) by mouth 3 (three) times daily. 04/20/18   Johnson, Deborah B, MD  glucose blood (RELION GLUCOSE TEST STRIPS) test strip 1 each by Other route 4 (four) times daily. ICD code E11.65 10/31/17   Johnson, Deborah B, MD  HUMULIN R U-500 KWIKPEN 500 UNIT/ML kwikpen Inject 120-180 Units into the skin 3 (three) times daily. 180 U before breakfast, 160 U before lunch, 140 U before dinner 02/20/17   Funches, Josalyn, MD  hydrOXYzine (ATARAX/VISTARIL) 25 MG tablet Take 25 mg by mouth at bedtime as needed.    [provider]  Insulin Pen Needle (B-D ULTRAFINE III SHORT PEN) 31G X 8 MM MISC 1 application by Does not apply route daily. 03/27/15   Funches, Josalyn, MD  Insulin Syringe-Needle U-100 (BD INSULIN SYRINGE ULTRAFINE) 31G X 15/64" 1 ML MISC 1 each by Does not apply route 3 (three) times daily. 03/27/15   Funches, Josalyn, MD  Lancet Devices (ACCU-CHEK SOFTCLIX) lancets Use as instructed 04/03/15   Funches, Josalyn, MD  levothyroxine (SYNTHROID, LEVOTHROID) 75 MCG tablet TK 1 T PO QD IN THE MORNING OES 11/09/18   [provider]  losartan (COZAAR)  100 MG tablet Take 1 tablet (100 mg total) by mouth every morning. 02/02/18   Johnson, Deborah B, MD  methocarbamol (ROBAXIN) 750 MG tablet Take 1 tablet (750 mg total) by mouth 3 (three) times daily. MUST MAKE APPT FOR FURTHER REFILLS 08/01/18   Newlin, Enobong, MD  Misc. Devices (CANE) MISC 1 each by Does not apply route daily. 07/18/16   Funches, Josalyn, MD  mometasone-formoterol (DULERA) 200-5 MCG/ACT AERO Inhale 2 puffs into the lungs 2 (two) times daily. 09/02/19   Ellison, Chi Jane, MD  montelukast (SINGULAIR) 10 MG tablet Take 1 tablet (10 mg total) by mouth at bedtime. 05/09/18   Newlin, Enobong, MD  NARCAN 4 MG/0.1ML LIQD nasal spray kit U UTS PRN 11/23/18   [provider]  nitroGLYCERIN (NITROSTAT) 0.4 MG SL tablet Place 1 tablet (0.4 mg total) under the tongue every 5 (five) minutes as needed. 12/25/18 03/25/19  Bhagat, Bhavinkumar, PA  omeprazole (PRILOSEC) 20 MG capsule Take 20 mg by mouth daily.    [provider]  PARoxetine (PAXIL) 20 MG tablet Take 1 tablet (20 mg total) by mouth daily. MUST MAKE APPT FOR FURTHER REFILLS 01/09/19   Newlin, Enobong, MD  predniSONE (DELTASONE) 1 MG tablet Take 2 tablets (2 mg total) by mouth daily. 12/02/19   Ellison, Chi Jane, MD  ReliOn Ultra Thin Lancets MISC 1 each by Does not apply route 4 (four) times daily. ICD code E11.65 03/27/15   Funches, Josalyn, MD  Respiratory Therapy Supplies (FLUTTER) DEVI Use as directed 09/16/14   Wert, Michael B, MD  rosuvastatin (CRESTOR) 20 MG tablet Take 1 tablet (20 mg total) by mouth daily. 05/10/19   Ross, Paula V, MD  Spacer/Aero-Holding Chambers (AEROCHAMBER MV) inhaler Use as instructed 07/05/16   McQuaid, Douglas B, MD  Tiotropium Bromide Monohydrate (SPIRIVA RESPIMAT) 1.25 MCG/ACT AERS Inhale 2 puffs into the lungs daily. 09/02/19   Ellison, Chi Jane, MD  Tiotropium Bromide Monohydrate (SPIRIVA RESPIMAT) 1.25 MCG/ACT AERS Inhale 2 puffs into the lungs daily. 09/02/19   Ellison, Chi Jane, MD   traZODone (DESYREL) 50 MG tablet Take 50 mg by mouth at bedtime.    [provider]  ULTICARE MICRO PEN NEEDLES 32G X 4 MM MISC USE AS DIRECTED 09/08/15   Funches, Josalyn, MD  verapamil (CALAN-SR) 180 MG CR tablet Take 2   tablets (360 mg total) by mouth at bedtime. 02/02/18   Ladell Pier, MD  Vitamin D, Ergocalciferol, (DRISDOL) 1.25 MG (50000 UT) CAPS capsule Take by mouth.    [provider]    Allergies    Hydrocortisone, Shellfish allergy, and Sulfamethoxazole-trimethoprim  Review of Systems   Review of Systems  Constitutional: Negative for chills and fever.  HENT: Negative for ear pain and sore throat.   Eyes: Negative for pain and visual disturbance.  Respiratory: Negative for cough and shortness of breath.   Cardiovascular: Negative for chest pain and palpitations.  Gastrointestinal: Negative for abdominal pain and vomiting.  Genitourinary: Negative for dysuria and hematuria.  Musculoskeletal: Negative for arthralgias and back pain.  Skin: Negative for color change and rash.  Neurological: Positive for dizziness and headaches. Negative for seizures and syncope.  All other systems reviewed and are negative.   Physical Exam Updated Vital Signs BP (!) 148/77 (BP Location: Right Arm)   Pulse 92   Temp 98.6 F (37 C) (Oral)   Resp 18   Ht 4' 11" (1.499 m)   Wt 90.7 kg   SpO2 100%   BMI 40.40 kg/m   Physical Exam Vitals and nursing note reviewed.  Constitutional:      General: She is not in acute distress.    Appearance: She is well-developed.  HENT:     Head: Normocephalic and atraumatic.  Eyes:     Conjunctiva/sclera: Conjunctivae normal.  Cardiovascular:     Rate and Rhythm: Normal rate and regular rhythm.     Heart sounds: No murmur.  Pulmonary:     Effort: Pulmonary effort is normal. No respiratory distress.     Breath sounds: Normal breath sounds.  Abdominal:     Palpations: Abdomen is soft.     Tenderness: There is no abdominal  tenderness.  Musculoskeletal:     Cervical back: Neck supple.  Skin:    General: Skin is warm and dry.  Neurological:     General: No focal deficit present.     Mental Status: She is alert and oriented to person, place, and time.     Comments: 5/5 strength in b/l UE, LE; sensation to light touch intact in all four extremities, CN 2-12 intact, normal FNF, normal gait     ED Results / Procedures / Treatments   Labs (all labs ordered are listed, but only abnormal results are displayed) Labs Reviewed  CBC WITH DIFFERENTIAL/PLATELET - Abnormal; Notable for the following components:      Result Value   RDW 16.8 (*)    All other components within normal limits  COMPREHENSIVE METABOLIC PANEL - Abnormal; Notable for the following components:   Glucose, Bld 129 (*)    All other components within normal limits  SAMPLE TO BLOOD BANK    EKG None  Radiology CT Head Wo Contrast  Result Date: 12/11/2019 CLINICAL DATA:  Left-sided headache, dizziness for 6 days EXAM: CT HEAD WITHOUT CONTRAST TECHNIQUE: Contiguous axial images were obtained from the base of the skull through the vertex without intravenous contrast. COMPARISON:  03/06/2017 FINDINGS: Brain: No acute infarct or hemorrhage. Lateral ventricles and midline structures are unremarkable. No acute extra-axial fluid collections. No mass effect. Vascular: No hyperdense vessel or unexpected calcification. Skull: Normal. Negative for fracture or focal lesion. Sinuses/Orbits: No acute finding. Other: None IMPRESSION: 1. No acute intracranial process. Electronically Signed   By: Randa Ngo M.D.   On: 12/11/2019 22:43    Procedures Procedures (including critical  care time)  Medications Ordered in ED Medications - No data to display  ED Course  I have reviewed the triage vital signs and the nursing notes.  Pertinent labs & imaging results that were available during my care of the patient were reviewed by me and considered in my medical  decision making (see chart for details).    MDM Rules/Calculators/A&P                      48-year-old lady who presented to ER with complaints of dizzy sensation on the left side of her head.  She denied any frank headache, no room spinning sensation.  No other neurologic symptoms, no neuro deficits on my exam, no ongoing symptoms.  Patient reported she was primarily concerned about recurrence of anemia.  Her hemoglobin was normal.  CT head negative for acute intracranial process.  Given current clinical appearance, labs and imaging, believe patient can be discharged home at this time.  I recommended recheck with her primary doctor.    After the discussed management above, the patient was determined to be safe for discharge.  The patient was in agreement with this plan and all questions regarding their care were answered.  ED return precautions were discussed and the patient will return to the ED with any significant worsening of condition.  Final Clinical Impression(s) / ED Diagnoses Final diagnoses:  Dizziness    Rx / DC Orders ED Discharge Orders    None       Dykstra, Richard S, MD 12/12/19 1541  

## 2019-12-16 ENCOUNTER — Ambulatory Visit: Payer: Medicaid Other | Admitting: Pulmonary Disease

## 2019-12-16 ENCOUNTER — Inpatient Hospital Stay: Payer: Medicaid Other | Attending: Nurse Practitioner

## 2020-01-06 ENCOUNTER — Telehealth: Payer: Self-pay | Admitting: Hematology and Oncology

## 2020-01-06 NOTE — Telephone Encounter (Signed)
Scheduled appt per 4/26 staff msg. Unable to leave voicemail. Mailed appt reminder and calendar.

## 2020-02-06 ENCOUNTER — Telehealth: Payer: Self-pay | Admitting: Pulmonary Disease

## 2020-02-06 MED ORDER — PREDNISONE 1 MG PO TABS: 2.0000 mg | ORAL_TABLET | Freq: Every day | ORAL | 0 refills | Status: DC

## 2020-02-06 NOTE — Telephone Encounter (Signed)
Called and spoke with patient. She needs refill for her daily Prednisone. Patient last OV was 12/21 and was supposed to return in 3 months and canceled appointment in April. Explained to patient that she is due for an appointment and has to be seen so that we can keep refilling her meds. She is now scheduled with Aaron Edelman for 6/28 at [place] for Prednisone sent in to last her to her follow up. Nothing further needed at this time.

## 2020-02-18 ENCOUNTER — Telehealth: Payer: Self-pay | Admitting: Pharmacy Technician

## 2020-02-18 NOTE — Telephone Encounter (Signed)
Received Dupixent PA renewal fax from [place].  Submitted a Prior Authorization request to [place] for [place] via Cover My Meds. Will update once we receive a response.  Conf# [id number] W

## 2020-02-19 NOTE — Telephone Encounter (Signed)
Received notification from [place] regarding a prior authorization for [place]. Authorization has been APPROVED from 02/18/20 to 08/16/20.   Authorization # [id number]   Called [place] and provided PA approval dates.

## 2020-02-20 ENCOUNTER — Other Ambulatory Visit: Payer: Self-pay | Admitting: Nurse Practitioner

## 2020-02-27 ENCOUNTER — Other Ambulatory Visit: Payer: Medicaid Other

## 2020-03-08 NOTE — Progress Notes (Signed)
$'@Patient'p$  ID: Raven Turner, female    DOB: 08/16/1971, 49 y.o.   MRN: [id number]  Chief Complaint  Patient presents with  . Follow-up    pt states refill on meds    Referring provider: [name], NP  HPI:  49 year old female never smoker followed in our office for severe persistent asthma  PMH: Type 2 diabetes, vitamin D deficiency, morbid obesity, [place], hypertension, Smoker/ Smoking History: Never smoker Maintenance: Dulera 200, Spiriva Respimat 1.25, Dupixent, prednisone 2 mg daily Pt of: Dr. Loanne Drilling  03/09/2020  - Visit   49 year old female never smoker followed in our office for severe persistent asthma.  Patient was last seen in our office in December/2020 by Dr. Loanne Drilling.  At that time she was encouraged to remain on Dulera 200s, Spiriva Respimat 1.25, Dupixent, 2 mg prednisone every day.  Patient was requested to follow-up in 3 months.  This is not yet been completed.  Patient is following up today.  Patient presented to our office today she was last seen in December/2020.  She did feel that the Spiriva Respimat helped.  She reports that the Spiriva Respimat unfortunately was not covered by her insurance.  She is maintained currently on Dulera 200, Dupixent as well as prednisone 2 mg daily.  She is hesitant to decrease her prednisone down.  We will discuss this today.  ACT score today is 17.  Overall patient feels that her breathing is fairly stable.  She has had some increased use of her rescue inhaler over the last 4 weeks which she attributes to the heat/weather.  Questionaires / Pulmonary Flowsheets:   ACT:  Asthma Control Test ACT Total Score  03/09/2020 17    MMRC: No flowsheet data found.  Epworth:  No flowsheet data found.  Tests:   FENO:  Lab Results  Component Value Date   NITRICOXIDE 16 07/05/2016    PFT: PFT Results Latest Ref Rng & Units 02/01/2018 11/03/2014  FVC-Pre L 2.03 1.84  FVC-Predicted Pre % 78 70  FVC-Post L 1.99 1.83   FVC-Predicted Post % 76 70  Pre FEV1/FVC % % 81 83  Post FEV1/FCV % % 87 85  FEV1-Pre L 1.65 1.52  FEV1-Predicted Pre % 78 71  FEV1-Post L 1.73 1.56  DLCO UNC% % 131 164  DLCO COR %Predicted % 169 201  TLC L 3.83 3.47  TLC % Predicted % 103 93  RV % Predicted % 139 107    WALK:  SIX MIN WALK 12/15/2014  Supplimental Oxygen during Test? (L/min) No  Tech Comments: normal pace/stopped twice due to increase SOB//lmr    Imaging: No results found.  Lab Results:  CBC    Component Value Date/Time   WBC 9.9 12/11/2019 1955   RBC 4.68 12/11/2019 1955   HGB 13.1 12/11/2019 1955   HGB 11.0 (L) 09/16/2019 0847   HGB 11.8 04/15/2019 1231   HCT 41.1 12/11/2019 1955   HCT 36.4 04/15/2019 1231   PLT 395 12/11/2019 1955   PLT 398 09/16/2019 0847   PLT 451 (H) 04/15/2019 1231   MCV 87.8 12/11/2019 1955   MCV 84 04/15/2019 1231   MCH 28.0 12/11/2019 1955   MCHC 31.9 12/11/2019 1955   RDW 16.8 (H) 12/11/2019 1955   RDW 14.5 04/15/2019 1231   LYMPHSABS 3.1 12/11/2019 1955   LYMPHSABS 3.8 (H) 05/09/2018 1126   MONOABS 0.8 12/11/2019 1955   EOSABS 0.3 12/11/2019 1955   EOSABS 0.4 05/09/2018 1126   BASOSABS 0.1 12/11/2019 1955  BASOSABS 0.1 05/09/2018 1126    BMET    Component Value Date/Time   NA 139 12/11/2019 1955   NA 142 04/15/2019 1231   K 4.2 12/11/2019 1955   CL 104 12/11/2019 1955   CO2 26 12/11/2019 1955   GLUCOSE 129 (H) 12/11/2019 1955   BUN 14 12/11/2019 1955   BUN 9 04/15/2019 1231   CREATININE 0.95 12/11/2019 1955   CREATININE 1.09 (H) 07/23/2018 1026   CREATININE 0.84 11/07/2016 1641   CALCIUM 9.4 12/11/2019 1955   GFRNONAA >60 12/11/2019 1955   GFRNONAA 60 (L) 07/23/2018 1026   GFRNONAA 85 11/07/2016 1641   GFRAA >60 12/11/2019 1955   GFRAA >60 07/23/2018 1026   GFRAA >89 11/07/2016 1641    BNP    Component Value Date/Time   BNP 53.3 09/08/2014 2213    ProBNP    Component Value Date/Time   PROBNP 28.0 02/16/2015 0930    Specialty Problems       Pulmonary Problems   Upper airway cough syndrome    Followed in Pulmonary clinic/ [facility]/ [place]  - Trial off advair      09/05/2014 >>>  - trial off theoph 09/17/2014 and added flutter  - rec reduce dulera to 100 2 bid 07/13/2015  - added chlorpheniramine 4 mg 2 at hs for noct "wheeze"  12/11/2015 >>>          OSA (obstructive sleep apnea)   Severe persistent asthma without complication      Allergies  Allergen Reactions  . Hydrocortisone Rash and Hives  . Shellfish Allergy Anaphylaxis and Hives  . Sulfamethoxazole-Trimethoprim Rash, Other (See Comments) and Itching    Facial redness with pruritus  Facial redness with pruritus     Immunization History  Administered Date(s) Administered  . Hepatitis B 03/23/2015, 07/06/2015  . Influenza Split 05/25/2015  . Influenza,inj,Quad PF,6+ Mos 05/13/2016, 05/29/2017, 07/13/2018, 06/03/2019  . PFIZER SARS-COV-2 Vaccination 12/02/2019, 12/23/2019  . Pneumococcal Polysaccharide-23 07/31/2017  . Pneumococcal-Unspecified 04/12/2012  . Tdap 06/24/2015    Past Medical History:  Diagnosis Date  . Anemia   . Asthma   . Bilateral hand numbness 06/25/2015  . Bone pain 02/27/2015   Diffuse bone pain in legs mostly but also in arms    . Breast pain, left 03/27/2015  . Callus of heel 12/16/2014  . Compression fracture   . Compression fracture of L1 lumbar vertebra (HCC) 08/26/2014  . Compression fracture of T12 vertebra (HCC) 08/28/2014  . COPD (chronic obstructive pulmonary disease) (HCC) 2013  . Cushing syndrome (HCC) 2013   . Depression   . Diabetes (HCC) 2013   . Diabetes mellitus type 2, uncontrolled (HCC) 08/26/2014   Sees Dr. Dione Booze.  Last visit 11/27/14.  No diabetic retinopathy    . Diabetes mellitus with neuropathy (HCC)   . Diastolic dysfunction without heart failure 10/14/2014   Grade 2 diastolic dysfunction 2/2 pulm HTN   . Generalized anxiety disorder 03/27/2015  . HTN (hypertension) 2005   . Hx of cataract  surgery 11/03/2014  . LUQ abdominal pain 07/17/2015  . Medication management 05/12/2015  . Morbid obesity (HCC) 09/08/2014  . 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 Non-English speaking patient 09/08/2014  . OSA (obstructive sleep apnea) 05/20/2015  . Osteopenia 11/19/2013   Dx with osteopenia in lumbar vertebra with partial compression of L1  . Pain due to onychomycosis of toenail 03/27/2015  . Secondary Cushing's syndrome/ iatrogenic   . Skin rash 07/17/2015  . Tachycardia 01/13/2015  . Upper airway  cough syndrome 09/05/2014   Followed in Pulmonary clinic/ [facility]/ [place]  - Trial off advair      09/05/2014 >>>  - trial off theoph 09/17/2014 and added flutter  - rec reduce dulera to 100 2 bid 07/13/2015     . Vision changes 06/25/2015  . Vitamin D deficiency 08/28/2014    Tobacco History: Social History   Tobacco Use  Smoking Status Never Smoker  Smokeless Tobacco Never Used   Counseling given: Yes   Continue to not smoke  Outpatient Encounter Medications as of 03/09/2020  Medication Sig  . albuterol (PROVENTIL HFA;VENTOLIN HFA) 108 (90 Base) MCG/ACT inhaler Inhale 2 puffs into the lungs every 4 (four) hours as needed for wheezing or shortness of breath (((PLAN B))).  Marland Kitchen albuterol (PROVENTIL) (2.5 MG/3ML) 0.083% nebulizer solution USE 1 VIAL VIA NEBULIZER EVERY 4 HOURS AS NEEDED FOR WHEEZING OR SHORTNESS OF BREATH (Patient taking differently: Take 2.5 mg by nebulization every 4 (four) hours as needed for wheezing or shortness of breath. )  . aspirin EC 81 MG tablet Take 1 tablet (81 mg total) by mouth daily.  . BD INSULIN SYRINGE U-500 31G X 6MM 0.5 ML MISC U UTD TID SUBCUTANEOUSLY  . Blood Glucose Monitoring Suppl (ACCU-CHEK AVIVA PLUS) W/DEVICE KIT Used as directed  . BUTRANS 7.5 MCG/HR PTWK 15 mcg.   . cabergoline (DOSTINEX) 0.5 MG tablet Take 0.5 tablets (0.25 mg total) by mouth 2 (two) times a week.  . calcium carbonate (CALCIUM 600) 600 MG TABS tablet Take 1 tablet by mouth daily.   . cholecalciferol (VITAMIN D) 1000 UNITS tablet Take 1,000 Units by mouth every morning.  . cloNIDine (CATAPRES) 0.2 MG tablet Take 0.2 mg by mouth 2 (two) times daily.  Marland Kitchen denosumab (PROLIA) 60 MG/ML SOSY injection INJECT 60 MG UNDER THE SKIN EVERY 6 MONTHS  . dupilumab (DUPIXENT) 300 MG/2ML prefilled syringe Inject 1 pen/syringe subcutaneously every other week  . EUCRISA 2 % OINT APP 1 APPLICATION AA BID PRN  . famotidine (PEPCID) 20 MG tablet Take by mouth.  . furosemide (LASIX) 20 MG tablet Take 1 tablet (20 mg total) by mouth every morning.  . gabapentin (NEURONTIN) 400 MG capsule Take 2 capsules (800 mg total) by mouth 3 (three) times daily.  Marland Kitchen glucose blood (RELION GLUCOSE TEST STRIPS) test strip 1 each by Other route 4 (four) times daily. ICD code E11.65  . HUMULIN R U-500 KWIKPEN 500 UNIT/ML kwikpen Inject 120-180 Units into the skin 3 (three) times daily. 180 U before breakfast, 160 U before lunch, 140 U before dinner  . hydrOXYzine (ATARAX/VISTARIL) 25 MG tablet Take 25 mg by mouth at bedtime as needed.  . Insulin Pen Needle (B-D ULTRAFINE III SHORT PEN) 31G X 8 MM MISC 1 application by Does not apply route daily.  . Insulin Syringe-Needle U-100 (BD INSULIN SYRINGE ULTRAFINE) 31G X 15/64" 1 ML MISC 1 each by Does not apply route 3 (three) times daily.  Elmore Guise Devices (ACCU-CHEK SOFTCLIX) lancets Use as instructed  . levothyroxine (SYNTHROID, LEVOTHROID) 75 MCG tablet TK 1 T PO QD IN THE MORNING OES  . losartan (COZAAR) 100 MG tablet Take 1 tablet (100 mg total) by mouth every morning.  . methocarbamol (ROBAXIN) 750 MG tablet Take 1 tablet (750 mg total) by mouth 3 (three) times daily. MUST MAKE APPT FOR FURTHER REFILLS  . Misc. Devices (CANE) MISC 1 each by Does not apply route daily.  . mometasone-formoterol (DULERA) 200-5 MCG/ACT AERO Inhale 2 puffs  into the lungs 2 (two) times daily.  . montelukast (SINGULAIR) 10 MG tablet Take 1 tablet (10 mg total) by mouth at bedtime.  Marland Kitchen  NARCAN 4 MG/0.1ML LIQD nasal spray kit U UTS PRN  . PARoxetine (PAXIL) 20 MG tablet Take 1 tablet (20 mg total) by mouth daily. MUST MAKE APPT FOR FURTHER REFILLS  . predniSONE (DELTASONE) 1 MG tablet Take 2 tablets (2 mg total) by mouth daily.  . ReliOn Ultra Thin Lancets MISC 1 each by Does not apply route 4 (four) times daily. ICD code E11.65  . Respiratory Therapy Supplies (FLUTTER) DEVI Use as directed  . rosuvastatin (CRESTOR) 20 MG tablet Take 1 tablet (20 mg total) by mouth daily.  Marland Kitchen Spacer/Aero-Holding Chambers (AEROCHAMBER MV) inhaler Use as instructed  . Tiotropium Bromide Monohydrate (SPIRIVA RESPIMAT) 1.25 MCG/ACT AERS Inhale 2 puffs into the lungs daily.  . Tiotropium Bromide Monohydrate (SPIRIVA RESPIMAT) 1.25 MCG/ACT AERS Inhale 2 puffs into the lungs daily.  . traZODone (DESYREL) 50 MG tablet Take 50 mg by mouth at bedtime.  Marland Kitchen ULTICARE MICRO PEN NEEDLES 32G X 4 MM MISC USE AS DIRECTED  . verapamil (CALAN-SR) 180 MG CR tablet Take 2 tablets (360 mg total) by mouth at bedtime.  . Vitamin D, Ergocalciferol, (DRISDOL) 1.25 MG (50000 UT) CAPS capsule Take by mouth.  . [DISCONTINUED] omeprazole (PRILOSEC) 20 MG capsule Take 20 mg by mouth daily.  Marland Kitchen Dexlansoprazole 30 MG capsule Take by mouth.  . nitroGLYCERIN (NITROSTAT) 0.4 MG SL tablet Place 1 tablet (0.4 mg total) under the tongue every 5 (five) minutes as needed.  . Tiotropium Bromide Monohydrate (SPIRIVA RESPIMAT) 1.25 MCG/ACT AERS Inhale 2 puffs into the lungs daily.  . [DISCONTINUED] tiotropium (SPIRIVA) 18 MCG inhalation capsule Place 1 capsule (18 mcg total) into inhaler and inhale daily.   Facility-Administered Encounter Medications as of 03/09/2020  Medication  . betamethasone acetate-betamethasone sodium phosphate (CELESTONE) injection 3 mg  . DOBUTamine (DOBUTREX) 1,000 mcg/mL in dextrose 5% 250 mL infusion  . umeclidinium bromide (INCRUSE ELLIPTA) 62.5 MCG/INH 1 puff     Review of Systems  Review of Systems   Constitutional: Negative for activity change, fatigue and fever.  HENT: Negative for sinus pressure, sinus pain and sore throat.   Respiratory: Positive for cough, shortness of breath and wheezing.   Cardiovascular: Negative for chest pain and palpitations.  Gastrointestinal: Negative for diarrhea, nausea and vomiting.  Musculoskeletal: Negative for arthralgias.  Neurological: Negative for dizziness.  Psychiatric/Behavioral: Negative for sleep disturbance. The patient is not nervous/anxious.      Physical Exam  BP 124/76 (BP Location: Left Arm, Cuff Size: Normal)   Pulse 88   Temp 98 F (36.7 C) (Oral)   Ht '4\' 10"'$  (1.473 m)   Wt 192 lb (87.1 kg)   SpO2 98%   BMI 40.13 kg/m   Wt Readings from Last 5 Encounters:  03/09/20 192 lb (87.1 kg)  12/11/19 200 lb (90.7 kg)  09/16/19 196 lb 8 oz (89.1 kg)  09/02/19 196 lb 9.6 oz (89.2 kg)  06/03/19 202 lb (91.6 kg)    BMI Readings from Last 5 Encounters:  03/09/20 40.13 kg/m  12/11/19 40.40 kg/m  09/16/19 41.07 kg/m  09/02/19 41.09 kg/m  06/03/19 42.22 kg/m     Physical Exam Vitals and nursing note reviewed.  Constitutional:      General: She is not in acute distress.    Appearance: Normal appearance. She is obese.  HENT:     Head: Normocephalic and atraumatic.  Right Ear: Tympanic membrane, ear canal and external ear normal. There is no impacted cerumen.     Left Ear: Tympanic membrane, ear canal and external ear normal. There is no impacted cerumen.     Nose: Nose normal. No congestion or rhinorrhea.     Mouth/Throat:     Mouth: Mucous membranes are moist.     Pharynx: Oropharynx is clear.  Eyes:     Pupils: Pupils are equal, round, and reactive to light.  Cardiovascular:     Rate and Rhythm: Normal rate and regular rhythm.     Pulses: Normal pulses.     Heart sounds: Normal heart sounds. No murmur heard.   Pulmonary:     Effort: Pulmonary effort is normal. No respiratory distress.     Breath sounds: No  decreased air movement. Wheezing (Slight expiratory wheeze) present. No decreased breath sounds or rales.  Musculoskeletal:     Cervical back: Normal range of motion.     Right lower leg: No edema.     Left lower leg: No edema.  Skin:    General: Skin is warm and dry.     Capillary Refill: Capillary refill takes less than 2 seconds.  Neurological:     General: No focal deficit present.     Mental Status: She is alert and oriented to person, place, and time. Mental status is at baseline.     Gait: Gait abnormal.  Psychiatric:        Mood and Affect: Mood normal.        Behavior: Behavior normal.        Thought Content: Thought content normal.        Judgment: Judgment normal.       Assessment & Plan:   Severe persistent asthma without complication ACT score today 17 Maintained on daily prednisone at 2 mg Currently using Dulera 200 Felt clinical benefit from Spiriva Respimat 1.25, patient reports that it was not covered by her insurance  Plan: Continue Dulera 200 Spiriva Respimat 1.25 samples provided today, restart We will discuss with pharmacy team to see what would be the most cost effective option for the patient and to see if patient's insurance would cover triple therapy inhaler such as Breztri  In 2 weeks we will have patient decrease prednisone down to 1 mg a day We will have patient follow-up in 8 weeks and be maintained on 1 mg prednisone until that follow-up Continue Dupixent   Medication management Plan: We will reach out to clinical pharmacy team to investigate what inhaler options would be most cost effective for the patient  Current chronic use of systemic steroids Today I reviewed with the patient:  Chronic Steroid Use  . places the patient at high risk for: osteoporosis, infections, diabetes or elevated glucose levels, cataracts or glaucoma, GERD/ Peptic Ulcer Disease, Heart Failure, and or peripheral edema  We try to limit the adverse effects of  systemic glucocorticoids by: Marland Kitchen Using the lowest dose of glucocorticoid to the shortest period of time to achieve treatment goals . Management of pre-existing comorbid conditions that may increase risk when glucocorticoids are required . Monitoring of patients under treatment for adverse effects who may benefit from additional interventions . If for longer than 3 months you should be started on supplemental calcium and vitamin d based on [employer] of Rheumatology (ACR) Taskforce Guidelines o Calcium 1000-'1200mg'$  a day  o Vitamin D 600-800 IU daily  . Ensure you are up to date with vaccinations  If you are using chronic glucocorticoids/steroids please make sure: . Have regular follow-up with primary care to monitor for blood sugars . Discuss bone density testing with primary care  . Notify primary care with changes in symptoms . Have regular follow-up with ophthalmology . Actively perform weightbearing exercises  . Avoid Smoking and drinking alcohol  . Take measures to prevent falls   Plan: We will work to reduce chronic steroid use We will have patient decrease prednisone to 1 mg daily in 2 weeks At next office visit if clinically stable will stop    Return in about 2 months (around 05/09/2020), or if symptoms worsen or fail to improve, for Follow up with Dr. Loanne Drilling.   Lauraine Rinne, NP 03/09/2020   This appointment required 35 minutes of patient care (this includes precharting, chart review, review of results, face-to-face care, etc.).

## 2020-03-09 ENCOUNTER — Encounter: Payer: Self-pay | Admitting: Pulmonary Disease

## 2020-03-09 ENCOUNTER — Ambulatory Visit (INDEPENDENT_AMBULATORY_CARE_PROVIDER_SITE_OTHER): Payer: Medicaid Other | Admitting: Pulmonary Disease

## 2020-03-09 ENCOUNTER — Other Ambulatory Visit: Payer: Self-pay

## 2020-03-09 VITALS — BP 124/76 | HR 88 | Temp 98.0°F | Ht <= 58 in | Wt 192.0 lb

## 2020-03-09 DIAGNOSIS — J4550 Severe persistent asthma, uncomplicated: Secondary | ICD-10-CM

## 2020-03-09 DIAGNOSIS — Z7952 Long term (current) use of systemic steroids: Secondary | ICD-10-CM | POA: Diagnosis not present

## 2020-03-09 DIAGNOSIS — Z79899 Other long term (current) drug therapy: Secondary | ICD-10-CM

## 2020-03-09 MED ORDER — TIOTROPIUM BROMIDE MONOHYDRATE 18 MCG IN CAPS: 18.0000 ug | ORAL_CAPSULE | Freq: Every day | RESPIRATORY_TRACT | 0 refills | Status: DC

## 2020-03-09 MED ORDER — SPIRIVA RESPIMAT 1.25 MCG/ACT IN AERS: 2.0000 | INHALATION_SPRAY | Freq: Every day | RESPIRATORY_TRACT | 0 refills | Status: DC

## 2020-03-09 NOTE — Assessment & Plan Note (Signed)
Plan: We will reach out to clinical pharmacy team to investigate what inhaler options would be most cost effective for the patient

## 2020-03-09 NOTE — Patient Instructions (Addendum)
You were seen today by Lauraine Rinne, NP  for:   1. Severe persistent asthma without complication  Dulera 341 >>> 2 puffs in the morning right when you wake up, rinse out your mouth after use, 12 hours later 2 puffs, rinse after use >>> Take this daily, no matter what >>> This is not a rescue inhaler   Spiriva Respimat 1.25 >>> 2 puffs daily >>> Do this every day >>>This is not a rescue inhaler  Continue Dupixent  Continue prednisone 2 mg daily, in 2 weeks please decrease this down to 1 mg daily maintain this dose until you are seen again in our clinic  I will see if the pharmacy team can investigate cost options for your inhalers  Follow Up:    Return in about 2 months (around 05/09/2020), or if symptoms worsen or fail to improve, for Follow up with Dr. Loanne Drilling.   Please do your part to reduce the spread of COVID-19:      Reduce your risk of any infection  and COVID19 by using the similar precautions used for avoiding the common cold or flu:  Marland Kitchen Wash your hands often with soap and warm water for at least 20 seconds.  If soap and water are not readily available, use an alcohol-based hand sanitizer with at least 60% alcohol.  . If coughing or sneezing, cover your mouth and nose by coughing or sneezing into the elbow areas of your shirt or coat, into a tissue or into your sleeve (not your hands). Langley Gauss A MASK when in public  . Avoid shaking hands with others and consider head nods or verbal greetings only. . Avoid touching your eyes, nose, or mouth with unwashed hands.  . Avoid close contact with people who are sick. . Avoid places or events with large numbers of people in one location, like concerts or sporting events. . If you have some symptoms but not all symptoms, continue to monitor at home and seek medical attention if your symptoms worsen. . If you are having a medical emergency, call 911.   [place]ynn / e-Visit:  [email or web address]         [facility]: [facility]: [phone]                   [facility]: [phone]     It is flu season:   >>> Best ways to protect herself from the flu: Receive the yearly flu vaccine, practice good hand hygiene washing with soap and also using hand sanitizer when available, eat a nutritious meals, get adequate rest, hydrate appropriately   Please contact the office if your symptoms worsen or you have concerns that you are not improving.   Thank you for choosing [facility] for your healthcare, and for allowing [place] to partner with you on your healthcare journey. I am thankful to be able to provide care to you today.   Wyn Quaker FNP-C

## 2020-03-09 NOTE — Assessment & Plan Note (Signed)
ACT score today 17 Maintained on daily prednisone at 2 mg Currently using Dulera 200 Felt clinical benefit from Spiriva Respimat 1.25, patient reports that it was not covered by her insurance  Plan: Continue Dulera 200 Spiriva Respimat 1.25 samples provided today, restart We will discuss with pharmacy team to see what would be the most cost effective option for the patient and to see if patient's insurance would cover triple therapy inhaler such as Breztri  In 2 weeks we will have patient decrease prednisone down to 1 mg a day We will have patient follow-up in 8 weeks and be maintained on 1 mg prednisone until that follow-up Continue Dupixent

## 2020-03-09 NOTE — Assessment & Plan Note (Signed)
Today I reviewed with the patient:  Chronic Steroid Use  . places the patient at high risk for: osteoporosis, infections, diabetes or elevated glucose levels, cataracts or glaucoma, GERD/ Peptic Ulcer Disease, Heart Failure, and or peripheral edema  We try to limit the adverse effects of systemic glucocorticoids by: Marland Kitchen Using the lowest dose of glucocorticoid to the shortest period of time to achieve treatment goals . Management of pre-existing comorbid conditions that may increase risk when glucocorticoids are required . Monitoring of patients under treatment for adverse effects who may benefit from additional interventions . If for longer than 3 months you should be started on supplemental calcium and vitamin d based on [employer] of Rheumatology (ACR) Taskforce Guidelines o Calcium 1000-1267m a day  o Vitamin D 600-800 IU daily  . Ensure you are up to date with vaccinations  If you are using chronic glucocorticoids/steroids please make sure: . Have regular follow-up with primary care to monitor for blood sugars . Discuss bone density testing with primary care  . Notify primary care with changes in symptoms . Have regular follow-up with ophthalmology . Actively perform weightbearing exercises  . Avoid Smoking and drinking alcohol  . Take measures to prevent falls   Plan: We will work to reduce chronic steroid use We will have patient decrease prednisone to 1 mg daily in 2 weeks At next office visit if clinically stable will stop

## 2020-03-12 ENCOUNTER — Telehealth: Payer: Self-pay | Admitting: Pulmonary Disease

## 2020-03-12 MED ORDER — PREDNISONE 1 MG PO TABS: 2.0000 mg | ORAL_TABLET | Freq: Every day | ORAL | 5 refills | Status: DC

## 2020-03-12 NOTE — Telephone Encounter (Signed)
Patient refill sent to wrong pharmacy. Refill sent to preferred pharmacy. Patient updated on refill.

## 2020-03-13 ENCOUNTER — Other Ambulatory Visit: Payer: Self-pay | Admitting: Hematology and Oncology

## 2020-03-13 ENCOUNTER — Telehealth: Payer: Self-pay | Admitting: Pharmacy Technician

## 2020-03-13 DIAGNOSIS — J45998 Other asthma: Secondary | ICD-10-CM

## 2020-03-13 DIAGNOSIS — D500 Iron deficiency anemia secondary to blood loss (chronic): Secondary | ICD-10-CM

## 2020-03-13 NOTE — Telephone Encounter (Signed)
Findings of benefits investigation via test claims at [facility]:   Insurance: [employer] managed care plan   Advair diskus - # 1 inhaler for a 1 month supply through patient's insurance is $ 0.00. Same copay for [facility] inhaler.  Symbicort - # 1 inhaler for a 1 month supply through patient's insurance is $ 0.00.  Dulera - # 1 inhaler for a 1 month supply through patient's insurance is $ 0.00.  Generic Air duo - # 1 inhaler for a 1 month supply through patient's insurance is $ 0.00.  Spiriva Respimat- # 1 inhaler for a 1 month supply through patient's insurance is $ 0.00.  Trelegy, Judithann Sauger, and Breo are non-preferred and would require prior authorization. Since patient has tried more than 1 preferred, PA approval would be likely.  [employer]: [phone], option 3

## 2020-03-13 NOTE — Telephone Encounter (Signed)
Raven Turner Please advise thank you

## 2020-03-16 NOTE — Telephone Encounter (Signed)
Please contact the patient and notify them that we have received information from our pharmacy team investigating inhaler cost.  Based on the information provided it seems the patient could remain on Dulera 200 and Spiriva Respimat 1.25 and have a $0 co-pay.  If the patient is interested in trying a triple therapy inhaler then we can try to proceed forward with a prior authorization.  My current recommendations would be for her to remain on Dulera 200.  And allow [place] to send a prescription to her pharmacy for Spiriva Respimat 1.25.  Per our pharmacy claims stated this should be a $0 co-pay.  Patient can use the samples that we have provided for her in the meantime.  Wyn Quaker, FNP

## 2020-03-17 ENCOUNTER — Other Ambulatory Visit: Payer: Self-pay

## 2020-03-17 ENCOUNTER — Encounter: Payer: Self-pay | Admitting: Hematology and Oncology

## 2020-03-17 ENCOUNTER — Inpatient Hospital Stay: Payer: Medicaid Other | Attending: Nurse Practitioner

## 2020-03-17 ENCOUNTER — Inpatient Hospital Stay (HOSPITAL_BASED_OUTPATIENT_CLINIC_OR_DEPARTMENT_OTHER): Payer: Medicaid Other | Admitting: Hematology and Oncology

## 2020-03-17 ENCOUNTER — Other Ambulatory Visit: Payer: Medicaid Other

## 2020-03-17 ENCOUNTER — Telehealth: Payer: Self-pay | Admitting: Hematology and Oncology

## 2020-03-17 DIAGNOSIS — N920 Excessive and frequent menstruation with regular cycle: Secondary | ICD-10-CM | POA: Diagnosis not present

## 2020-03-17 DIAGNOSIS — Z7951 Long term (current) use of inhaled steroids: Secondary | ICD-10-CM | POA: Diagnosis not present

## 2020-03-17 DIAGNOSIS — G4733 Obstructive sleep apnea (adult) (pediatric): Secondary | ICD-10-CM | POA: Diagnosis not present

## 2020-03-17 DIAGNOSIS — E119 Type 2 diabetes mellitus without complications: Secondary | ICD-10-CM | POA: Diagnosis not present

## 2020-03-17 DIAGNOSIS — Z7982 Long term (current) use of aspirin: Secondary | ICD-10-CM | POA: Insufficient documentation

## 2020-03-17 DIAGNOSIS — I10 Essential (primary) hypertension: Secondary | ICD-10-CM | POA: Diagnosis not present

## 2020-03-17 DIAGNOSIS — D500 Iron deficiency anemia secondary to blood loss (chronic): Secondary | ICD-10-CM | POA: Diagnosis present

## 2020-03-17 DIAGNOSIS — Z794 Long term (current) use of insulin: Secondary | ICD-10-CM | POA: Insufficient documentation

## 2020-03-17 DIAGNOSIS — Z7952 Long term (current) use of systemic steroids: Secondary | ICD-10-CM | POA: Diagnosis not present

## 2020-03-17 DIAGNOSIS — Z79899 Other long term (current) drug therapy: Secondary | ICD-10-CM | POA: Insufficient documentation

## 2020-03-17 DIAGNOSIS — J449 Chronic obstructive pulmonary disease, unspecified: Secondary | ICD-10-CM | POA: Diagnosis not present

## 2020-03-17 DIAGNOSIS — E66813 Obesity, class 3: Secondary | ICD-10-CM

## 2020-03-17 LAB — CBC WITH DIFFERENTIAL/PLATELET
Abs Immature Granulocytes: 0.05 10*3/uL (ref 0.00–0.07)
Basophils Absolute: 0.1 10*3/uL (ref 0.0–0.1)
Basophils Relative: 1 %
Eosinophils Absolute: 0.4 10*3/uL (ref 0.0–0.5)
Eosinophils Relative: 4 %
HCT: 36.4 % (ref 36.0–46.0)
Hemoglobin: 11.6 g/dL — ABNORMAL LOW (ref 12.0–15.0)
Immature Granulocytes: 1 %
Lymphocytes Relative: 36 %
Lymphs Abs: 3.8 10*3/uL (ref 0.7–4.0)
MCH: 28.4 pg (ref 26.0–34.0)
MCHC: 31.9 g/dL (ref 30.0–36.0)
MCV: 89 fL (ref 80.0–100.0)
Monocytes Absolute: 0.7 10*3/uL (ref 0.1–1.0)
Monocytes Relative: 7 %
Neutro Abs: 5.6 10*3/uL (ref 1.7–7.7)
Neutrophils Relative %: 51 %
Platelets: 354 10*3/uL (ref 150–400)
RBC: 4.09 MIL/uL (ref 3.87–5.11)
RDW: 14.6 % (ref 11.5–15.5)
WBC: 10.6 10*3/uL — ABNORMAL HIGH (ref 4.0–10.5)
nRBC: 0 % (ref 0.0–0.2)

## 2020-03-17 LAB — IRON AND TIBC
Iron: 46 ug/dL (ref 41–142)
Saturation Ratios: 14 % — ABNORMAL LOW (ref 21–57)
TIBC: 338 ug/dL (ref 236–444)
UIBC: 291 ug/dL (ref 120–384)

## 2020-03-17 LAB — FERRITIN: Ferritin: 33 ng/mL (ref 11–307)

## 2020-03-17 MED ORDER — SPIRIVA RESPIMAT 1.25 MCG/ACT IN AERS: 2.0000 | INHALATION_SPRAY | Freq: Every day | RESPIRATORY_TRACT | 3 refills | Status: DC

## 2020-03-17 MED ORDER — ALBUTEROL SULFATE HFA 108 (90 BASE) MCG/ACT IN AERS: 2.0000 | INHALATION_SPRAY | RESPIRATORY_TRACT | 6 refills | Status: DC | PRN

## 2020-03-17 MED ORDER — MOMETASONE FURO-FORMOTEROL FUM 200-5 MCG/ACT IN AERO: 2.0000 | INHALATION_SPRAY | Freq: Two times a day (BID) | RESPIRATORY_TRACT | 3 refills | Status: DC

## 2020-03-17 NOTE — Telephone Encounter (Signed)
Please send in prescription for Dulera 200, Spiriva Respimat 1.25, okay to send in albuterol prescription as well.Wyn Quaker, FNP

## 2020-03-17 NOTE — Telephone Encounter (Signed)
Spoke with patient and given recommendations from [place].  Patient does not have a prescription for Dulera at her pharmacy, please advise if you would like me to send it.  She also needs a refill of her Albuterol, it was last prescribed by Dr. Lake Bells in 2020.  Ok to refill?

## 2020-03-17 NOTE — Assessment & Plan Note (Signed)
She has chronic menorrhagia I recommend GYN referral for further management It does not make sense for [place] to be giving her intravenous iron infusion every few months but not address her menorrhagia She is in agreement with the plan of care

## 2020-03-17 NOTE — Telephone Encounter (Signed)
Called patient via interpreter services , let her know the two inhalers were sent in and how to take each inhaler. She let me know preferred pharmacy and no further questions at this time.

## 2020-03-17 NOTE — Telephone Encounter (Signed)
Scheduled appts per 7/6 sch msg. Gave pt a print out of AVS.

## 2020-03-17 NOTE — Assessment & Plan Note (Signed)
She has significant class III obesity with multiple medical comorbidities The weight gain stem from chronic prednisone therapy along with insulin-dependent diabetes We discussed the importance of weight loss management because class III obesity is associated with increased risk of menorrhagia We discussed the risk and benefits of referral to weight management center and she is in agreement

## 2020-03-17 NOTE — Progress Notes (Signed)
[place] FOLLOW-UP progress notes  Patient Care Team: [name], NP as PCP - General (Nurse Practitioner) Fay Records, MD as PCP - Cardiology (Cardiology)  CHIEF COMPLAINTS/PURPOSE OF VISIT:  Recurrent iron deficiency anemia secondary to chronic menorrhagia  HISTORY OF PRESENTING ILLNESS:  Raven Turner 49 y.o. female was transferred to my care after her prior physician has left.  Interpreter is present throughout the entire visit I reviewed the patient's records extensive and collaborated the history with the patient. Summary of her history is as follows: This patient has symptomatic frequent multiple medical comorbidities She is steroid-dependent, prescribed for chronic COPD/asthma management She has significant weight gain and is currently using insulin therapy She was seen here repeatedly by different hematologist for iron deficiency anemia secondary to menorrhagia She has received intermittent intravenous iron infusion chronically She complains of intermittent fatigue She has chronic menorrhagia, with menstruation, lasting 6 to 8 days of her cycle of every 30 days, described as heavy She tolerated intravenous iron well She did not tolerate oral iron supplement causing constipation and GI distress The patient denies any recent signs or symptoms of bleeding such as spontaneous epistaxis, hematuria or hematochezia.   MEDICAL HISTORY:  Past Medical History:  Diagnosis Date  . Anemia   . Asthma   . Bilateral hand numbness 06/25/2015  . Bone pain 02/27/2015   Diffuse bone pain in legs mostly but also in arms    . Breast pain, left 03/27/2015  . Callus of heel 12/16/2014  . Compression fracture   . Compression fracture of L1 lumbar vertebra (HCC) 08/26/2014  . Compression fracture of T12 vertebra ([place]) 08/28/2014  . COPD (chronic obstructive pulmonary disease) ([place]) 2013  . Cushing syndrome ([place]) 2013   . Depression   . Diabetes ([place]) 2013   .  Diabetes mellitus type 2, uncontrolled ([place]) 08/26/2014   Sees Dr. Katy Fitch.  Last visit 11/27/14.  No diabetic retinopathy    . Diabetes mellitus with neuropathy ([place])   . Diastolic dysfunction without heart failure 10/14/2014   Grade 2 diastolic dysfunction 2/2 pulm HTN   . Generalized anxiety disorder 03/27/2015  . HTN (hypertension) 2005   . Hx of cataract surgery 11/03/2014  . LUQ abdominal pain 07/17/2015  . Medication management 05/12/2015  . Morbid obesity ([place]) 09/08/2014  . 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 Non-English speaking patient 09/08/2014  . OSA (obstructive sleep apnea) 05/20/2015  . Osteopenia 11/19/2013   Dx with osteopenia in lumbar vertebra with partial compression of L1  . Pain due to onychomycosis of toenail 03/27/2015  . Secondary Cushing's syndrome/ iatrogenic   . Skin rash 07/17/2015  . Tachycardia 01/13/2015  . Upper airway cough syndrome 09/05/2014   Followed in Pulmonary clinic/ [facility]/ [place]  - Trial off advair      09/05/2014 >>>  - trial off theoph 09/17/2014 and added flutter  - rec reduce dulera to 100 2 bid 07/13/2015     . Vision changes 06/25/2015  . Vitamin D deficiency 08/28/2014    SURGICAL HISTORY: Past Surgical History:  Procedure Laterality Date  . CATARACT EXTRACTION Bilateral 2014  . HAND SURGERY Right 12/18/2015  . VAGINAL HYSTERECTOMY      SOCIAL HISTORY: Social History   Socioeconomic History  . Marital status: Married    Spouse name: Not on file  . Number of children: 3  . Years of education: Not on file  . Highest education level: Not on file  Occupational History  . Occupation:  disability  Tobacco Use  . Smoking status: Never Smoker  . Smokeless tobacco: Never Used  Vaping Use  . Vaping Use: Never used  Substance and Sexual Activity  . Alcohol use: No    Alcohol/week: 0.0 standard drinks  . Drug use: No  . Sexual activity: Yes    Birth control/protection: Surgical  Other Topics Concern  . Not on file  Social History  Narrative   Lives at home with husband.  No children   She does not work   Highest level of education:  12th grade   Social Determinants of Radio broadcast assistant Strain:   . Difficulty of Paying Living Expenses:   Food Insecurity:   . Worried About Charity fundraiser in the Last Year:   . Arboriculturist in the Last Year:   Transportation Needs:   . Film/video editor (Medical):   Marland Kitchen Lack of Transportation (Non-Medical):   Physical Activity:   . Days of Exercise per Week:   . Minutes of Exercise per Session:   Stress:   . Feeling of Stress :   Social Connections:   . Frequency of Communication with Friends and Family:   . Frequency of Social Gatherings with Friends and Family:   . Attends Religious Services:   . Active Member of Clubs or Organizations:   . Attends Archivist Meetings:   Marland Kitchen Marital Status:   Intimate Partner Violence:   . Fear of Current or Ex-Partner:   . Emotionally Abused:   Marland Kitchen Physically Abused:   . Sexually Abused:     FAMILY HISTORY: Family History  Problem Relation Age of Onset  . Diabetes Mother   . Stroke Father   . Asthma Father   . Hypertension Sister   . Hypertension Brother     ALLERGIES:  is allergic to hydrocortisone, shellfish allergy, and sulfamethoxazole-trimethoprim.  MEDICATIONS:  Current Outpatient Medications  Medication Sig Dispense Refill  . albuterol (PROVENTIL HFA;VENTOLIN HFA) 108 (90 Base) MCG/ACT inhaler Inhale 2 puffs into the lungs every 4 (four) hours as needed for wheezing or shortness of breath (((PLAN B))). 18 g 6  . albuterol (PROVENTIL) (2.5 MG/3ML) 0.083% nebulizer solution USE 1 VIAL VIA NEBULIZER EVERY 4 HOURS AS NEEDED FOR WHEEZING OR SHORTNESS OF BREATH (Patient taking differently: Take 2.5 mg by nebulization every 4 (four) hours as needed for wheezing or shortness of breath. ) 90 mL 0  . aspirin EC 81 MG tablet Take 1 tablet (81 mg total) by mouth daily. 90 tablet 3  . BD INSULIN SYRINGE  U-500 31G X 6MM 0.5 ML MISC U UTD TID SUBCUTANEOUSLY    . Blood Glucose Monitoring Suppl (ACCU-CHEK AVIVA PLUS) W/DEVICE KIT Used as directed 1 kit 0  . BUTRANS 7.5 MCG/HR PTWK 15 mcg.     . cabergoline (DOSTINEX) 0.5 MG tablet Take 0.5 tablets (0.25 mg total) by mouth 2 (two) times a week. 10 tablet 3  . calcium carbonate (CALCIUM 600) 600 MG TABS tablet Take 1 tablet by mouth daily.    . cholecalciferol (VITAMIN D) 1000 UNITS tablet Take 1,000 Units by mouth every morning.    . cloNIDine (CATAPRES) 0.2 MG tablet Take 0.2 mg by mouth 2 (two) times daily.    Marland Kitchen denosumab (PROLIA) 60 MG/ML SOSY injection INJECT 60 MG UNDER THE SKIN EVERY 6 MONTHS    . Dexlansoprazole 30 MG capsule Take by mouth.    . dupilumab (DUPIXENT) 300 MG/2ML prefilled syringe Inject  1 pen/syringe subcutaneously every other week 4 mL 5  . EUCRISA 2 % OINT APP 1 APPLICATION AA BID PRN    . famotidine (PEPCID) 20 MG tablet Take by mouth.    . furosemide (LASIX) 20 MG tablet Take 1 tablet (20 mg total) by mouth every morning. 30 tablet 2  . gabapentin (NEURONTIN) 400 MG capsule Take 2 capsules (800 mg total) by mouth 3 (three) times daily. 180 capsule 3  . glucose blood (RELION GLUCOSE TEST STRIPS) test strip 1 each by Other route 4 (four) times daily. ICD code E11.65 400 each 3  . HUMULIN R U-500 KWIKPEN 500 UNIT/ML kwikpen Inject 120-180 Units into the skin 3 (three) times daily. 180 U before breakfast, 160 U before lunch, 140 U before dinner 27 mL 11  . hydrOXYzine (ATARAX/VISTARIL) 25 MG tablet Take 25 mg by mouth at bedtime as needed.    . Insulin Pen Needle (B-D ULTRAFINE III SHORT PEN) 31G X 8 MM MISC 1 application by Does not apply route daily. 100 each 3  . Insulin Syringe-Needle U-100 (BD INSULIN SYRINGE ULTRAFINE) 31G X 15/64" 1 ML MISC 1 each by Does not apply route 3 (three) times daily. 300 each 3  . Lancet Devices (ACCU-CHEK SOFTCLIX) lancets Use as instructed 1 each 0  . levothyroxine (SYNTHROID, LEVOTHROID) 75  MCG tablet TK 1 T PO QD IN THE MORNING OES    . losartan (COZAAR) 100 MG tablet Take 1 tablet (100 mg total) by mouth every morning. 30 tablet 6  . methocarbamol (ROBAXIN) 750 MG tablet Take 1 tablet (750 mg total) by mouth 3 (three) times daily. MUST MAKE APPT FOR FURTHER REFILLS 60 tablet 0  . Misc. Devices (CANE) MISC 1 each by Does not apply route daily. 1 each 0  . mometasone-formoterol (DULERA) 200-5 MCG/ACT AERO Inhale 2 puffs into the lungs 2 (two) times daily. 8.8 g 3  . montelukast (SINGULAIR) 10 MG tablet Take 1 tablet (10 mg total) by mouth at bedtime. 30 tablet 6  . NARCAN 4 MG/0.1ML LIQD nasal spray kit U UTS PRN    . nitroGLYCERIN (NITROSTAT) 0.4 MG SL tablet Place 1 tablet (0.4 mg total) under the tongue every 5 (five) minutes as needed. 25 tablet 3  . PARoxetine (PAXIL) 20 MG tablet Take 1 tablet (20 mg total) by mouth daily. MUST MAKE APPT FOR FURTHER REFILLS 30 tablet 1  . predniSONE (DELTASONE) 1 MG tablet Take 2 tablets (2 mg total) by mouth daily. 60 tablet 5  . ReliOn Ultra Thin Lancets MISC 1 each by Does not apply route 4 (four) times daily. ICD code E11.65 400 each 3  . Respiratory Therapy Supplies (FLUTTER) DEVI Use as directed 1 each 0  . rosuvastatin (CRESTOR) 20 MG tablet Take 1 tablet (20 mg total) by mouth daily. 90 tablet 3  . Spacer/Aero-Holding Chambers (AEROCHAMBER MV) inhaler Use as instructed 1 each 0  . Tiotropium Bromide Monohydrate (SPIRIVA RESPIMAT) 1.25 MCG/ACT AERS Inhale 2 puffs into the lungs daily. 2 g 0  . Tiotropium Bromide Monohydrate (SPIRIVA RESPIMAT) 1.25 MCG/ACT AERS Inhale 2 puffs into the lungs daily. 4 g 5  . Tiotropium Bromide Monohydrate (SPIRIVA RESPIMAT) 1.25 MCG/ACT AERS Inhale 2 puffs into the lungs daily. 4 g 0  . traZODone (DESYREL) 50 MG tablet Take 50 mg by mouth at bedtime.    Marland Kitchen ULTICARE MICRO PEN NEEDLES 32G X 4 MM MISC USE AS DIRECTED 100 each 2  . verapamil (CALAN-SR) 180 MG CR  tablet Take 2 tablets (360 mg total) by mouth at  bedtime. 180 tablet 2  . Vitamin D, Ergocalciferol, (DRISDOL) 1.25 MG (50000 UT) CAPS capsule Take by mouth.     Current Facility-Administered Medications  Medication Dose Route Frequency Provider Last Rate Last Admin  . betamethasone acetate-betamethasone sodium phosphate (CELESTONE) injection 3 mg  3 mg Intramuscular Once Evans, Brent M, DPM      . umeclidinium bromide (INCRUSE ELLIPTA) 62.5 MCG/INH 1 puff  1 puff Inhalation Daily Ladell Pier, MD       Facility-Administered Medications Ordered in Other Visits  Medication Dose Route Frequency Provider Last [place]  . DOBUTamine (DOBUTREX) 1,000 mcg/mL in dextrose 5% 250 mL infusion  30 mcg/kg/min Intravenous Continuous Croitoru, Mihai, MD 145.3 mL/hr at 04/22/15 1300 30 mcg/kg/min at 04/22/15 1300    REVIEW OF SYSTEMS:   Constitutional: Denies fevers, chills or abnormal night sweats Eyes: Denies blurriness of vision, double vision or watery eyes Ears, nose, mouth, throat, and face: Denies mucositis or sore throat Cardiovascular: Denies palpitation, chest discomfort or lower extremity swelling Gastrointestinal:  Denies nausea, heartburn or change in bowel habits Skin: Denies abnormal skin rashes Lymphatics: Denies new lymphadenopathy or easy bruising Neurological:Denies numbness, tingling or new weaknesses Behavioral/Psych: Mood is stable, no new changes  All other systems were reviewed with the patient and are negative.  PHYSICAL EXAMINATION: ECOG PERFORMANCE STATUS: 1 - Symptomatic but completely ambulatory  Vitals:   03/17/20 0937  BP: 130/72  Pulse: 90  Resp: 18  Temp: 98.6 F (37 C)  SpO2: 100%   Filed Weights   03/17/20 0937  Weight: 193 lb (87.5 kg)    GENERAL:alert, no distress and comfortable.  She appears cushingoid, clinically obese SKIN: skin color, texture, turgor are normal, no rashes or significant lesions EYES: normal, conjunctiva are pink and non-injected, sclera clear OROPHARYNX:no  exudate, normal lips, buccal mucosa, and tongue  NECK: supple, thyroid normal size, non-tender, without nodularity LYMPH:  no palpable lymphadenopathy in the cervical, axillary or inguinal LUNGS: clear to auscultation and percussion with normal breathing effort HEART: regular rate & rhythm and no murmurs without lower extremity edema ABDOMEN:abdomen soft, non-tender and normal bowel sounds Musculoskeletal:no cyanosis of digits and no clubbing  PSYCH: alert & oriented x 3 with fluent speech NEURO: no focal motor/sensory deficits  LABORATORY DATA:  I have reviewed the data as listed Lab Results  Component Value Date   WBC 10.6 (H) 03/17/2020   HGB 11.6 (L) 03/17/2020   HCT 36.4 03/17/2020   MCV 89.0 03/17/2020   PLT 354 03/17/2020   Recent Labs    04/15/19 1231 07/05/19 1553 12/11/19 1955  NA 142  --  139  K 4.2  --  4.2  CL 100  --  104  CO2 27  --  26  GLUCOSE 100*  --  129*  BUN 9  --  14  CREATININE 0.88  --  0.95  CALCIUM 8.9  --  9.4  GFRNONAA 79  --  >60  GFRAA 91  --  >60  PROT  --   --  7.8  ALBUMIN  --   --  3.7  AST  --  17 18  ALT  --   --  21  ALKPHOS  --   --  80  BILITOT  --   --  0.5    ASSESSMENT & PLAN:  Iron deficiency anemia The most likely cause of her anemia is due to chronic blood  loss/malabsorption syndrome. We discussed some of the risks, benefits, and alternatives of intravenous iron infusions. The patient is symptomatic from anemia and the iron level is critically low. She tolerated oral iron supplement poorly and desires to achieved higher levels of iron faster for adequate hematopoesis. Some of the side-effects to be expected including risks of infusion reactions, phlebitis, headaches, nausea and fatigue.  The patient is willing to proceed. Patient education material was dispensed.  Goal is to keep ferritin level greater than 50 and resolution of anemia She will start intravenous iron infusion weekly x2 I will see her back in 6 months for  further follow-up We discussed the importance of referral to gynecologist for management of menorrhagia   Menorrhagia She has chronic menorrhagia I recommend GYN referral for further management It does not make sense for [place] to be giving her intravenous iron infusion every few months but not address her menorrhagia She is in agreement with the plan of care  Obesity, Class III, BMI 40-49.9 (morbid obesity) ([place]) She has significant class III obesity with multiple medical comorbidities The weight gain stem from chronic prednisone therapy along with insulin-dependent diabetes We discussed the importance of weight loss management because class III obesity is associated with increased risk of menorrhagia We discussed the risk and benefits of referral to weight management center and she is in agreement   Orders Placed This Encounter  Procedures  . Ambulatory referral to Gynecology    Referral Priority:   Routine    Referral Type:   Consultation    Referral Reason:   Specialty Services Required    Requested Specialty:   Gynecology    Number of Visits Requested:   1  . Amb Ref to Medical Weight Management    Referral Priority:   Routine    Referral Type:   Consultation    Number of Visits Requested:   1    All questions were answered. The patient knows to call the clinic with any problems, questions or concerns. The total time spent in the appointment was 40 minutes encounter with patients including review of chart and various tests results, discussions about plan of care and coordination of care plan   Heath Lark, MD 03/17/2020 10:01 AM

## 2020-03-17 NOTE — Assessment & Plan Note (Signed)
The most likely cause of her anemia is due to chronic blood loss/malabsorption syndrome. We discussed some of the risks, benefits, and alternatives of intravenous iron infusions. The patient is symptomatic from anemia and the iron level is critically low. She tolerated oral iron supplement poorly and desires to achieved higher levels of iron faster for adequate hematopoesis. Some of the side-effects to be expected including risks of infusion reactions, phlebitis, headaches, nausea and fatigue.  The patient is willing to proceed. Patient education material was dispensed.  Goal is to keep ferritin level greater than 50 and resolution of anemia She will start intravenous iron infusion weekly x2 I will see her back in 6 months for further follow-up We discussed the importance of referral to gynecologist for management of menorrhagia

## 2020-03-20 ENCOUNTER — Telehealth: Payer: Self-pay | Admitting: Pulmonary Disease

## 2020-03-20 NOTE — Telephone Encounter (Signed)
Received PA for Ventolin. Called patient's pharmacy to give ok to switch  to Proair with same instructions. Prescription will be filled. Patient called and informed of medication change. Patient voiced understanding.

## 2020-03-23 ENCOUNTER — Ambulatory Visit
Admission: RE | Admit: 2020-03-23 | Discharge: 2020-03-23 | Disposition: A | Discharge status: Home / Self Care | Payer: Medicaid Other | Source: Ambulatory Visit | Attending: Nurse Practitioner | Admitting: Nurse Practitioner

## 2020-03-23 ENCOUNTER — Other Ambulatory Visit: Payer: Self-pay | Admitting: Nurse Practitioner

## 2020-03-23 ENCOUNTER — Other Ambulatory Visit: Payer: Self-pay

## 2020-03-23 DIAGNOSIS — N6320 Unspecified lump in the left breast, unspecified quadrant: Secondary | ICD-10-CM

## 2020-03-23 DIAGNOSIS — N6310 Unspecified lump in the right breast, unspecified quadrant: Secondary | ICD-10-CM

## 2020-03-24 ENCOUNTER — Other Ambulatory Visit: Payer: Self-pay

## 2020-03-24 ENCOUNTER — Inpatient Hospital Stay: Payer: Medicaid Other

## 2020-03-24 VITALS — BP 110/62 | HR 65 | Temp 97.9°F | Resp 18

## 2020-03-24 DIAGNOSIS — D509 Iron deficiency anemia, unspecified: Secondary | ICD-10-CM

## 2020-03-24 DIAGNOSIS — D500 Iron deficiency anemia secondary to blood loss (chronic): Secondary | ICD-10-CM | POA: Diagnosis not present

## 2020-03-24 MED ORDER — SODIUM CHLORIDE 0.9 % IV SOLN
510.0000 mg | Freq: Once | INTRAVENOUS | Status: AC
  Administered 2020-03-24: 510 mg via INTRAVENOUS
  Filled 2020-03-24: qty 510

## 2020-03-24 MED ORDER — SODIUM CHLORIDE 0.9 % IV SOLN
Freq: Once | INTRAVENOUS | Status: AC
  Filled 2020-03-24: qty 250

## 2020-03-24 NOTE — Patient Instructions (Signed)

## 2020-03-31 ENCOUNTER — Other Ambulatory Visit: Payer: Self-pay

## 2020-03-31 ENCOUNTER — Telehealth: Payer: Self-pay | Admitting: *Deleted

## 2020-03-31 ENCOUNTER — Inpatient Hospital Stay: Payer: Medicaid Other

## 2020-03-31 VITALS — BP 107/53 | HR 66 | Temp 97.8°F | Resp 18

## 2020-03-31 DIAGNOSIS — D509 Iron deficiency anemia, unspecified: Secondary | ICD-10-CM

## 2020-03-31 DIAGNOSIS — D500 Iron deficiency anemia secondary to blood loss (chronic): Secondary | ICD-10-CM | POA: Diagnosis not present

## 2020-03-31 MED ORDER — SODIUM CHLORIDE 0.9 % IV SOLN
510.0000 mg | Freq: Once | INTRAVENOUS | Status: AC
  Administered 2020-03-31: 510 mg via INTRAVENOUS
  Filled 2020-03-31: qty 510

## 2020-03-31 MED ORDER — SODIUM CHLORIDE 0.9 % IV SOLN
Freq: Once | INTRAVENOUS | Status: AC
  Filled 2020-03-31: qty 250

## 2020-03-31 NOTE — Telephone Encounter (Signed)
Per Dr.Gorsuch, called pt PCP due to [facility] not accepting new pt's with her insurance. Called pt PCP and requested PCP put in referral  to see a GYN that accepts pt insurance type. PCP nurse placed referral in for pt to see GYN and will contact pt.

## 2020-03-31 NOTE — Patient Instructions (Addendum)
Ferumoxytol injection Qu es este medicamento? El FERUMOXITOL es un complejo de hierro. El hierro se [place] para producir glbulos rojos saludables, que transportan oxgeno y nutrientes por el cuerpo. Este medicamento se [place] para tratar la anemia por deficiencia de hierro. Este medicamento puede ser utilizado para otros usos; si tiene alguna pregunta consulte con su proveedor de atencin mdica o con su farmacutico. MARCAS COMUNES: Feraheme Qu le debo informar a mi profesional de la salud antes de tomar este medicamento? Necesita saber si usted presenta alguno de los [employer] o situaciones:  anemia no provocada por niveles bajos de hierro  niveles altos de hierro en la sangre  examen de imgenes por Health visitor (IRM) programado  una reaccin alrgica o inusual al hierro, otros medicamentos, alimentos, colorantes o conservantes  si est embarazada o buscando quedar embarazada  si est amamantando a un beb Cmo debo Insurance account manager medicamento? Este medicamento se administra mediante inyeccin por va intravenosa. Lo administra un profesional de Technical sales engineer en un hospital o en un entorno clnico. Hable con su pediatra para informarse acerca del uso de este medicamento en nios. Puede requerir atencin especial. Sobredosis: Pngase en contacto inmediatamente con un centro toxicolgico o una sala de urgencia si usted cree que haya tomado demasiado medicamento. ATENCIN: [employer] es solo para usted. No comparta este medicamento con nadie. Qu sucede si me olvido de una dosis? Es importante no olvidar ninguna dosis. Informe a su mdico o a su profesional de la salud si no puede asistir a Photographer. Qu puede interactuar con este medicamento? Esta medicina puede interactuar con los siguientes medicamentos:  otros productos de hierro Puede ser que esta lista no menciona todas las posibles interacciones. Informe a su profesional de [employer]	[place] de [employer] productos a  base de hierbas, medicamentos de [place] o suplementos nutritivos que est tomando. Si usted fuma, consume bebidas alcohlicas o si utiliza drogas ilegales, indqueselo tambin a su profesional de [employer]	[place]. Algunas sustancias pueden interactuar con su medicamento. A qu debo estar atento al usar [employer]? Visite a su mdico o a su profesional de la salud de [place] regular. Si los sntomas no comienzan a mejorar o si empeoran, consulte con su mdico o con su profesional de [employer]	[place]. Tal vez necesita realizarse anlisis de sangre mientras recibe [place]. Tal vez necesita seguir Counselling psychologist. Consulte a su mdico. Los alimentos que contienen hierro incluyen: alimentos integrales o con cereales, frutas secas, frijoles o arvejas, vegetales de hoja verde y carne que proviene de rganos (hgado, rin). Qu efectos secundarios puedo tener al [employer] este medicamento? Efectos secundarios que debe informar a su mdico o a Barrister's clerk de la salud tan pronto como sea posible: Chief of Staff, como erupcin cutnea, picazn o urticarias, e hinchazn de la cara, los labios o la lengua problemas respiratorios cambios en la presin sangunea sensacin de desmayos o aturdimiento, cadas fiebre o escalofros enrojecimiento, sudoracin o sensacin de calor hinchazn de tobillos o pies Efectos secundarios que generalmente no requieren atencin mdica (infrmelos a su mdico o a Barrister's clerk de la salud si persisten o si son molestos): diarrea dolor de cabeza nuseas, vmito dolor estomacal Puede ser que esta lista no menciona todos los posibles efectos secundarios. Comunquese a su mdico por asesoramiento mdico [employer]. Usted puede informar los efectos secundarios a la FDA por telfono al 1-800-FDA-1088. Dnde debo guardar mi medicina? Este medicamento se administra en hospitales o clnicas y no Sports coach  en su domicilio. ATENCIN: Este folleto es un  resumen. Puede ser que no cubra toda la posible informacin. Si usted tiene preguntas acerca de esta medicina, consulte con su mdico, su farmacutico o su profesional de Technical sales engineer.  2020 Elsevier/Gold Standard (2016-12-20 00:00:00)

## 2020-04-06 ENCOUNTER — Ambulatory Visit (INDEPENDENT_AMBULATORY_CARE_PROVIDER_SITE_OTHER): Payer: Medicaid Other | Admitting: Bariatrics

## 2020-04-06 ENCOUNTER — Encounter (INDEPENDENT_AMBULATORY_CARE_PROVIDER_SITE_OTHER): Payer: Self-pay | Admitting: Bariatrics

## 2020-04-06 ENCOUNTER — Other Ambulatory Visit: Payer: Self-pay

## 2020-04-06 VITALS — BP 118/76 | HR 75 | Temp 98.3°F | Ht <= 58 in | Wt 192.0 lb

## 2020-04-06 DIAGNOSIS — E7849 Other hyperlipidemia: Secondary | ICD-10-CM

## 2020-04-06 DIAGNOSIS — D508 Other iron deficiency anemias: Secondary | ICD-10-CM

## 2020-04-06 DIAGNOSIS — E559 Vitamin D deficiency, unspecified: Secondary | ICD-10-CM

## 2020-04-06 DIAGNOSIS — E1169 Type 2 diabetes mellitus with other specified complication: Secondary | ICD-10-CM | POA: Diagnosis not present

## 2020-04-06 DIAGNOSIS — R5383 Other fatigue: Secondary | ICD-10-CM

## 2020-04-06 DIAGNOSIS — K760 Fatty (change of) liver, not elsewhere classified: Secondary | ICD-10-CM

## 2020-04-06 DIAGNOSIS — Z0289 Encounter for other administrative examinations: Secondary | ICD-10-CM

## 2020-04-06 DIAGNOSIS — I10 Essential (primary) hypertension: Secondary | ICD-10-CM

## 2020-04-06 DIAGNOSIS — Z6841 Body Mass Index (BMI) 40.0 and over, adult: Secondary | ICD-10-CM

## 2020-04-06 DIAGNOSIS — Z1331 Encounter for screening for depression: Secondary | ICD-10-CM

## 2020-04-06 DIAGNOSIS — I152 Hypertension secondary to endocrine disorders: Secondary | ICD-10-CM

## 2020-04-06 DIAGNOSIS — E66813 Obesity, class 3: Secondary | ICD-10-CM

## 2020-04-06 DIAGNOSIS — R0602 Shortness of breath: Secondary | ICD-10-CM | POA: Diagnosis not present

## 2020-04-06 DIAGNOSIS — E249 Cushing's syndrome, unspecified: Secondary | ICD-10-CM

## 2020-04-06 DIAGNOSIS — E1159 Type 2 diabetes mellitus with other circulatory complications: Secondary | ICD-10-CM

## 2020-04-06 NOTE — Progress Notes (Signed)
Dear Dr. Heath Lark,   Thank you for referring Desirie Minteer to our clinic. The following note includes my evaluation and treatment recommendations.  Chief Complaint:   OBESITY Jon Kasparek (MR# [id number]) is a 49 y.o. female who presents for evaluation and treatment of obesity and related comorbidities. Current BMI is Body mass index is 44.63 kg/m.Marland Kitchen Almeda has been struggling with her weight for many years and has been unsuccessful in either losing weight, maintaining weight loss, or reaching her healthy weight goal.  Shaunda is currently in the action stage of change and ready to dedicate time achieving and maintaining a healthier weight. Takoda is interested in becoming our patient and working on intensive lifestyle modifications including (but not limited to) diet and exercise for weight loss.  Roselani does like to cook and denies any obstacles. She snacks after dinner.  Rendy's habits were reviewed today and are as follows: Her family eats meals together, she thinks her family will eat healthier with her, her desired weight loss is 72 lbs, she started gaining weight in 2012, her heaviest weight ever was 218 pounds, she craves fruit, she snacks frequently in the evenings, she skips lunch sometimes, she is frequently drinking liquids with calories and she frequently eats larger portions than normal.  Depression Screen Elzora's Food and Mood (modified PHQ-9) score was 7.  Depression screen [facility] 2/9 04/06/2020  Decreased Interest 3  Down, Depressed, Hopeless 1  PHQ - 2 Score 4  Altered sleeping 0  Tired, decreased energy 3  Change in appetite 0  Feeling bad or failure about yourself  0  Trouble concentrating 0  Moving slowly or fidgety/restless 0  Suicidal thoughts 0  PHQ-9 Score 7  Difficult doing work/chores Not difficult at all  Some recent data might be hidden   Subjective:   Other fatigue. Tamey denies daytime somnolence and denies  waking up still tired.Demiah generally gets 8 hours of sleep per night, and states that she has generally restful sleep. Snoring is sometimes present. Apneic episodes are not present. Epworth Sleepiness Score is 9.  Shortness of breath on exertion. Fatema notes increasing shortness of breath with exercising and seems to be worsening over time with weight gain. She notes getting out of breath sooner with activity than she used to. This has gotten worse recently. Taylor denies shortness of breath at rest or orthopnea.  Hypertension associated with diabetes ([place]). Jyll is taking clonidine, spironolactone, Verapamil, and furosemide. Blood pressure is controlled.  BP Readings from Last 3 Encounters:  04/06/20 118/76  03/31/20 (!) 107/53  03/24/20 110/62   Lab Results  Component Value Date   CREATININE 0.95 12/11/2019   CREATININE 0.88 04/15/2019   CREATININE 1.09 (H) 09/06/2018   Type 2 diabetes mellitus with other specified complication, without long-term current use of insulin ([place]). Diabetes mellitus was diagnosed 7 years ago. Lashina currently denies lows. Fasting blood sugars range between 150 and 170 with 2-hour postprandial 160-190+. She is taking Humulin R U-500.   Lab Results  Component Value Date   HGBA1C 8.1 (H) 04/15/2019   HGBA1C 8.3 (A) 05/09/2018   HGBA1C 7.9 (A) 01/30/2018   Lab Results  Component Value Date   MICROALBUR 150 03/27/2017   [place] 66 07/05/2019   CREATININE 0.95 12/11/2019   No results found for: INSULIN  Secondary Cushing's syndrome/ iatrogenic. 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  Fatty liver. Evaleigh denies abdominal pain. She had a CT scan of the abdomen in December 2019  which showed hepatic steatosis.  Vitamin D deficiency. Meloney is taking OTC Vitamin D supplementation.   Other hyperlipidemia. Maebelle is taking Crestor.   Lab Results  Component Value Date   CHOL 135 07/05/2019   HDL 50  07/05/2019   LDLCALC 66 07/05/2019   TRIG 104 07/05/2019   CHOLHDL 2.7 07/05/2019   Lab Results  Component Value Date   ALT 21 12/11/2019   AST 18 12/11/2019   ALKPHOS 80 12/11/2019   BILITOT 0.5 12/11/2019   The 10-year ASCVD risk score Mikey Bussing DC Jr., et al., 2013) is: 1.3%   Values used to calculate the score:     Age: 34 years     Sex: Female     Is Non-Hispanic African American: No     Diabetic: Yes     Tobacco smoker: No     Systolic Blood Pressure: 357 mmHg     Is BP treated: Yes     HDL Cholesterol: 50 mg/dL     Total Cholesterol: 135 mg/dL  Other iron deficiency anemia. Elianah had her last iron infusion last week which showed a low ferritin and low hemoglobin and hematocrit.  CBC Latest Ref Rng & Units 03/17/2020 12/11/2019 09/16/2019  WBC 4.0 - 10.5 K/uL 10.6(H) 9.9 10.8(H)  Hemoglobin 12.0 - 15.0 g/dL 11.6(L) 13.1 11.0(L)  Hematocrit 36 - 46 % 36.4 41.1 34.8(L)  Platelets 150 - 400 K/uL 354 395 398   Lab Results  Component Value Date   IRON 46 03/17/2020   TIBC 338 03/17/2020   FERRITIN 33 03/17/2020   Lab Results  Component Value Date   VITAMINB12 371 06/06/2018   Depression screening. Deshae had a mildly positive depression screen with a PHQ-9 score of 7.  Assessment/Plan:   Other fatigue. Lynee does feel that her weight is causing her energy to be lower than it should be. Fatigue may be related to obesity, depression or many other causes. Labs will be ordered, and in the meanwhile, Clytie will focus on self care including making healthy food choices, increasing physical activity and focusing on stress reduction. EKG 12-Lead, T3, T4, free, TSH  Shortness of breath on exertion. Rhilee does feel that she gets out of breath more easily that she used to when she exercises. Irmalee's shortness of breath appears to be obesity related and exercise induced. She has agreed to work on weight loss and gradually increase exercise to treat her exercise  induced shortness of breath. Will continue to monitor closely.  Hypertension associated with diabetes ([place]). Juanelle is working on healthy weight loss and exercise to improve blood pressure control. We will watch for signs of hypotension as she continues her lifestyle modifications. She will continue her medications as directed.  Type 2 diabetes mellitus with other specified complication, without long-term current use of insulin ([place]). Good blood sugar control is important to decrease the likelihood of diabetic complications such as nephropathy, neuropathy, limb loss, blindness, coronary artery disease, and death. Intensive lifestyle modification including diet, exercise and weight loss are the first line of treatment for diabetes. Sianni will follow-up with her endocrinologist in September 2021. She will take 130 units of insulin at breakfast; 110 units at lunch; 90 units at dinner (down from 130 units). Microalbumin / creatinine urine ratio, Comprehensive metabolic panel, Hemoglobin A1c, Insulin, random labs will be checked today.   Secondary Cushing's syndrome/ iatrogenic. Xitlalli will follow-up with her endocrinologist in September 2021 and will continue her medication as directed.   Fatty liver. Will follow  over time.  Vitamin D deficiency. Low Vitamin D level contributes to fatigue and are associated with obesity, breast, and colon cancer. VITAMIN D 25 Hydroxy (Vit-D Deficiency, Fractures) level will be checked today.  Other hyperlipidemia. Cardiovascular risk and specific lipid/LDL goals reviewed.  We discussed several lifestyle modifications today and Sueann will continue to work on diet, exercise and weight loss efforts. Orders and follow up as documented in patient record. She will continue Crestor as directed. Lipid Panel With LDL/HDL Ratio will be checked today.  Counseling Intensive lifestyle modifications are the first line treatment for this issue. . Dietary changes: Increase  soluble fiber. Decrease simple carbohydrates. . Exercise changes: Moderate to vigorous-intensity aerobic activity 150 minutes per week if tolerated. . Lipid-lowering medications: see documented in medical record.   Other iron deficiency anemia. Orders and follow up as documented in patient record. Tayia will continue iron infusions every months and will follow-up with Hematology as scheduled and as directed.   Counseling . Iron is essential for our bodies to make red blood cells.  Reasons that someone may be deficient include: an iron-deficient diet (more likely in those following vegan or vegetarian diets), women with heavy menses, patients with GI disorders or poor absorption, patients that have had bariatric surgery, frequent blood donors, patients with cancer, and patients with heart disease.   Marland Kitchen An iron supplement has been recommended. This is found over-the-counter.   [place] foods include dark leafy greens, red and white meats, eggs, seafood, and beans.   . Certain foods and drinks prevent your body from absorbing iron properly. Avoid eating these foods in the same meal as iron-rich foods or with iron supplements. These foods include: coffee, black tea, and red wine; milk, dairy products, and foods that are high in calcium; beans and soybeans; whole grains.  . Constipation can be a side effect of iron supplementation. Increased water and fiber intake are helpful. Water goal: > 2 liters/day. Fiber goal: > 25 grams/day.  Depression screening. Sheniya had a positive depression screening. Depression is commonly associated with obesity and often results in emotional eating behaviors. We will monitor this closely and work on CBT to help improve the non-hunger eating patterns. Referral to Psychology may be required if no improvement is seen as she continues in our clinic.  Class 3 severe obesity with serious comorbidity and body mass index (BMI) of 40.0 to 44.9 in adult, unspecified obesity  type ([place]).  Emmeline is currently in the action stage of change and her goal is to continue with weight loss efforts. I recommend Ivonna begin the structured treatment plan as follows:  She has agreed to the Category 2 Plan. We reviewed lunch and dinner options.  We reviewed with the patient labs from 12/11/2019 including CMP and glucose; labs from 03/17/2020 including iron panel and CBC.  She will work on meal planning, intentional eating, and avoiding sugary drinks (soda, juice).  Exercise goals: All adults should avoid inactivity. Some physical activity is better than none, and adults who participate in any amount of physical activity gain some health benefits.   Behavioral modification strategies: increasing lean protein intake, decreasing simple carbohydrates, increasing vegetables, increasing water intake, decreasing eating out, no skipping meals, meal planning and cooking strategies, keeping healthy foods in the home and planning for success.  She was informed of the importance of frequent follow-up visits to maximize her success with intensive lifestyle modifications for her multiple health conditions. She was informed we would discuss her lab results at  her next visit unless there is a critical issue that needs to be addressed sooner. Zakari agreed to keep her next visit at the agreed upon time to discuss these results.  Objective:   Blood pressure 118/76, pulse 75, temperature 98.3 F (36.8 C), height 4' 7"  (1.397 m), weight 192 lb (87.1 kg), SpO2 98 %. Body mass index is 44.63 kg/m.  EKG: Sinus  Rhythm with a rate of 72 BPM. Low voltage in precordial leads probably secondary to body habitus. Otherwise normal.  Indirect Calorimeter completed today shows a VO2 of 220 and a REE of 1531.  Her calculated basal metabolic rate is 4917 thus her basal metabolic rate is better than expected.  General: Cooperative, alert, well developed, in no acute distress. HEENT: Conjunctivae  and lids unremarkable. Cardiovascular: Regular rhythm.  Lungs: Normal work of breathing. Neurologic: No focal deficits.   Lab Results  Component Value Date   CREATININE 0.95 12/11/2019   BUN 14 12/11/2019   NA 139 12/11/2019   K 4.2 12/11/2019   CL 104 12/11/2019   CO2 26 12/11/2019   Lab Results  Component Value Date   ALT 21 12/11/2019   AST 18 12/11/2019   ALKPHOS 80 12/11/2019   BILITOT 0.5 12/11/2019   Lab Results  Component Value Date   HGBA1C 8.1 (H) 04/15/2019   HGBA1C 8.3 (A) 05/09/2018   HGBA1C 7.9 (A) 01/30/2018   HGBA1C 6.5 07/31/2017   HGBA1C 6.5 05/29/2017   No results found for: INSULIN Lab Results  Component Value Date   TSH 3.510 10/31/2017   Lab Results  Component Value Date   CHOL 135 07/05/2019   HDL 50 07/05/2019   LDLCALC 66 07/05/2019   TRIG 104 07/05/2019   CHOLHDL 2.7 07/05/2019   Lab Results  Component Value Date   WBC 10.6 (H) 03/17/2020   HGB 11.6 (L) 03/17/2020   HCT 36.4 03/17/2020   MCV 89.0 03/17/2020   PLT 354 03/17/2020   Lab Results  Component Value Date   IRON 46 03/17/2020   TIBC 338 03/17/2020   FERRITIN 33 03/17/2020   Attestation Statements:   Reviewed by clinician on day of visit: allergies, medications, problem list, medical history, surgical history, family history, social history, and previous encounter notes.  Time spent on visit including pre-visit chart review and post-visit charting and care was 60 minutes.   Migdalia Dk, am acting as Location manager for [employer], DO   I have reviewed the above documentation for accuracy and completeness, and I agree with the above. Jearld Lesch, DO

## 2020-04-07 ENCOUNTER — Encounter (INDEPENDENT_AMBULATORY_CARE_PROVIDER_SITE_OTHER): Payer: Self-pay | Admitting: Bariatrics

## 2020-04-07 LAB — LIPID PANEL WITH LDL/HDL RATIO
Cholesterol, Total: 118 mg/dL (ref 100–199)
HDL: 44 mg/dL (ref >39)
LDL Chol Calc (NIH): 55 mg/dL (ref 0–99)
LDL/HDL Ratio: 1.3 ratio (ref 0.0–3.2)
Triglycerides: 105 mg/dL (ref 0–149)
VLDL Cholesterol Cal: 19 mg/dL (ref 5–40)

## 2020-04-07 LAB — COMPREHENSIVE METABOLIC PANEL
ALT: 21 IU/L (ref 0–32)
AST: 18 IU/L (ref 0–40)
Albumin/Globulin Ratio: 1.4 (ref 1.2–2.2)
Albumin: 4.2 g/dL (ref 3.8–4.8)
Alkaline Phosphatase: 96 IU/L (ref 48–121)
BUN/Creatinine Ratio: 15 (ref 9–23)
BUN: 12 mg/dL (ref 6–24)
Bilirubin Total: <0.2 mg/dL (ref 0.0–1.2)
CO2: 25 mmol/L (ref 20–29)
Calcium: 9.2 mg/dL (ref 8.7–10.2)
Chloride: 101 mmol/L (ref 96–106)
Creatinine, Ser: 0.81 mg/dL (ref 0.57–1.00)
GFR calc Af Amer: 100 mL/min/{1.73_m2} (ref >59)
GFR calc non Af Amer: 87 mL/min/{1.73_m2} (ref >59)
Globulin, Total: 2.9 g/dL (ref 1.5–4.5)
Glucose: 107 mg/dL — ABNORMAL HIGH (ref 65–99)
Potassium: 4.4 mmol/L (ref 3.5–5.2)
Sodium: 139 mmol/L (ref 134–144)
Total Protein: 7.1 g/dL (ref 6.0–8.5)

## 2020-04-07 LAB — MICROALBUMIN / CREATININE URINE RATIO
Creatinine, Urine: 25.3 mg/dL
Microalb/Creat Ratio: 163 mg/g creat — ABNORMAL HIGH (ref 0–29)
Microalbumin, Urine: 41.3 ug/mL

## 2020-04-07 LAB — HEMOGLOBIN A1C
Est. average glucose Bld gHb Est-mCnc: 174 mg/dL
Hgb A1c MFr Bld: 7.7 % — ABNORMAL HIGH (ref 4.8–5.6)

## 2020-04-07 LAB — VITAMIN D 25 HYDROXY (VIT D DEFICIENCY, FRACTURES): Vit D, 25-Hydroxy: 35.2 ng/mL (ref 30.0–100.0)

## 2020-04-07 LAB — INSULIN, RANDOM: INSULIN: 28.7 u[IU]/mL — ABNORMAL HIGH (ref 2.6–24.9)

## 2020-04-07 LAB — T4, FREE: Free T4: 0.99 ng/dL (ref 0.82–1.77)

## 2020-04-07 LAB — T3: T3, Total: 156 ng/dL (ref 71–180)

## 2020-04-07 LAB — TSH: TSH: 2.57 u[IU]/mL (ref 0.450–4.500)

## 2020-04-20 ENCOUNTER — Ambulatory Visit (INDEPENDENT_AMBULATORY_CARE_PROVIDER_SITE_OTHER): Payer: Medicaid Other | Admitting: Bariatrics

## 2020-04-20 ENCOUNTER — Encounter (INDEPENDENT_AMBULATORY_CARE_PROVIDER_SITE_OTHER): Payer: Self-pay | Admitting: Bariatrics

## 2020-04-20 ENCOUNTER — Other Ambulatory Visit: Payer: Self-pay

## 2020-04-20 VITALS — BP 119/76 | HR 86 | Temp 98.5°F | Ht <= 58 in | Wt 188.0 lb

## 2020-04-20 DIAGNOSIS — R809 Proteinuria, unspecified: Secondary | ICD-10-CM | POA: Diagnosis not present

## 2020-04-20 DIAGNOSIS — E11649 Type 2 diabetes mellitus with hypoglycemia without coma: Secondary | ICD-10-CM | POA: Diagnosis not present

## 2020-04-20 DIAGNOSIS — I10 Essential (primary) hypertension: Secondary | ICD-10-CM

## 2020-04-20 DIAGNOSIS — E559 Vitamin D deficiency, unspecified: Secondary | ICD-10-CM | POA: Diagnosis not present

## 2020-04-20 DIAGNOSIS — E66813 Obesity, class 3: Secondary | ICD-10-CM

## 2020-04-20 DIAGNOSIS — Z6841 Body Mass Index (BMI) 40.0 and over, adult: Secondary | ICD-10-CM

## 2020-04-20 NOTE — Progress Notes (Signed)
Chief Complaint:   OBESITY Raven Turner is here to discuss her progress with her obesity treatment plan along with follow-up of her obesity related diagnoses. Raven Turner is on the Category 2 Plan and states she is following her eating plan approximately 70% of the time. Raven Turner states she is exercising 0 minutes 0 times per week.  Today's visit was #: 2 Starting weight: 192 lbs Starting date: 04/06/2020 Today's weight: 188 lbs Today's date: 04/20/2020 Total lbs lost to date: 4 Total lbs lost since last in-office visit: 4  Interim History: Raven Turner is down 4 lbs since her last visit. She has an interpreter with her Raven Turner) who was present throughout the visit.  Subjective:   Uncontrolled type 2 diabetes mellitus with hypoglycemia without coma (Raven Turner). Raven Turner is on insulin 130 units at breakfast, 110 units at lunch, and 90 units at dinner. Insulin was decreased at her last visit secondary to starting her plan. She reports having blood sugars as high as 300 and as low as 30.   Lab Results  Component Value Date   HGBA1C 7.7 (H) 04/06/2020   HGBA1C 8.1 (H) 04/15/2019   HGBA1C 8.3 (A) 05/09/2018   Lab Results  Component Value Date   MICROALBUR 150 03/27/2017   LDLCALC 55 04/06/2020   CREATININE 0.81 04/06/2020   Lab Results  Component Value Date   INSULIN 28.7 (H) 04/06/2020   Essential hypertension. Raven Turner is taking Cozaar, aldactone, Calan SR, and Catapres. Blood pressure is controlled.  BP Readings from Last 3 Encounters:  04/20/20 119/76  04/06/20 118/76  03/31/20 (!) 107/53   Lab Results  Component Value Date   CREATININE 0.81 04/06/2020   CREATININE 0.95 12/11/2019   CREATININE 0.88 04/15/2019   Microalbuminuria. Raven Turner is on an ARB.  Vitamin D deficiency. Raven Turner is on Vitamin 50,000 units.   Ref. Range 04/06/2020 14:10  Vitamin D, 25-Hydroxy Latest Ref Range: 30.0 - 100.0 ng/mL 35.2   Assessment/Plan:   Uncontrolled type 2  diabetes mellitus with hypoglycemia without coma (Raven Turner). Good blood sugar control is important to decrease the likelihood of diabetic complications such as nephropathy, neuropathy, limb loss, blindness, coronary artery disease, and death. Intensive lifestyle modification including diet, exercise and weight loss are the first line of treatment for diabetes.  Raven Turner will decrease insulin to 90 units in the a.m., 80 units in the afternoon and 80 units in the p.m. (changed per the endocrinologist)  Essential hypertension. Raven Turner is working on healthy weight loss and exercise to improve blood pressure control. We will watch for signs of hypotension as she continues her lifestyle modifications. She will continue her medications as directed.   Microalbuminuria. Raven Turner will continue her medication as directed. She will work on better control of diabetes mellitus type II and maintaining blood pressure control.  Vitamin D deficiency. Low Vitamin D level contributes to fatigue and are associated with obesity, breast, and colon cancer. She agrees to continue to take Vitamin D as directed and will follow-up for routine testing of Vitamin D, at least 2-3 times per year to avoid over-replacement.  Class 3 severe obesity with serious comorbidity and body mass index (BMI) of 40.0 to 44.9 in adult, unspecified obesity type (Raven Turner).  Raven Turner is currently in the action stage of change. As such, her goal is to continue with weight loss efforts. She has agreed to the Category 2 Plan.   She will work on meal planning, decreasing carbohydrates, and intentional eating.  We reviewed with the patient  and her interpreter labs from 04/06/2020 including CMP, lipids, microalbumin, Vitamin D, A1c, insulin, and thyroid panel.  Exercise goals: All adults should avoid inactivity. Some physical activity is better than none, and adults who participate in any amount of physical activity gain some health benefits.  Behavioral  modification strategies: increasing lean protein intake, decreasing simple carbohydrates, increasing vegetables, increasing water intake, decreasing eating out, no skipping meals, meal planning and cooking strategies, keeping healthy foods in the home and planning for success.  Raven Turner has agreed to follow-up with our clinic in 2 weeks. She was informed of the importance of frequent follow-up visits to maximize her success with intensive lifestyle modifications for her multiple health conditions.   Objective:   Blood pressure 119/76, pulse 86, temperature 98.5 F (36.9 C), SpO2 97 %. There is no height or weight on file to calculate BMI.  General: Cooperative, alert, well developed, in no acute distress. HEENT: Conjunctivae and lids unremarkable. Cardiovascular: Regular rhythm.  Lungs: Normal work of breathing. Neurologic: No focal deficits.   Lab Results  Component Value Date   CREATININE 0.81 04/06/2020   BUN 12 04/06/2020   NA 139 04/06/2020   K 4.4 04/06/2020   CL 101 04/06/2020   CO2 25 04/06/2020   Lab Results  Component Value Date   ALT 21 04/06/2020   AST 18 04/06/2020   ALKPHOS 96 04/06/2020   BILITOT <0.2 04/06/2020   Lab Results  Component Value Date   HGBA1C 7.7 (H) 04/06/2020   HGBA1C 8.1 (H) 04/15/2019   HGBA1C 8.3 (A) 05/09/2018   HGBA1C 7.9 (A) 01/30/2018   HGBA1C 6.5 07/31/2017   Lab Results  Component Value Date   INSULIN 28.7 (H) 04/06/2020   Lab Results  Component Value Date   TSH 2.570 04/06/2020   Lab Results  Component Value Date   CHOL 118 04/06/2020   HDL 44 04/06/2020   LDLCALC 55 04/06/2020   TRIG 105 04/06/2020   CHOLHDL 2.7 07/05/2019   Lab Results  Component Value Date   WBC 10.6 (H) 03/17/2020   HGB 11.6 (L) 03/17/2020   HCT 36.4 03/17/2020   MCV 89.0 03/17/2020   PLT 354 03/17/2020   Lab Results  Component Value Date   IRON 46 03/17/2020   TIBC 338 03/17/2020   FERRITIN 33 03/17/2020   Attestation Statements:    Reviewed by clinician on day of visit: allergies, medications, problem list, medical history, surgical history, family history, social history, and previous encounter notes.  Time spent on visit including pre-visit chart review and post-visit charting and care was 30 minutes.   Migdalia Dk, am acting as Location manager for [employer], DO   I have reviewed the above documentation for accuracy and completeness, and I agree with the above. Jearld Lesch, DO

## 2020-04-21 ENCOUNTER — Encounter (INDEPENDENT_AMBULATORY_CARE_PROVIDER_SITE_OTHER): Payer: Self-pay | Admitting: Bariatrics

## 2020-04-30 ENCOUNTER — Ambulatory Visit: Payer: Medicaid Other | Admitting: Internal Medicine

## 2020-04-30 ENCOUNTER — Encounter: Payer: Self-pay | Admitting: Internal Medicine

## 2020-04-30 ENCOUNTER — Other Ambulatory Visit: Payer: Self-pay

## 2020-04-30 VITALS — BP 124/62 | HR 84 | Ht <= 58 in | Wt 193.0 lb

## 2020-04-30 DIAGNOSIS — I2510 Atherosclerotic heart disease of native coronary artery without angina pectoris: Secondary | ICD-10-CM

## 2020-04-30 LAB — LIPID PANEL
Chol/HDL Ratio: 2.8 ratio (ref 0.0–4.4)
Cholesterol, Total: 128 mg/dL (ref 100–199)
HDL: 45 mg/dL (ref >39)
LDL Chol Calc (NIH): 63 mg/dL (ref 0–99)
Triglycerides: 106 mg/dL (ref 0–149)
VLDL Cholesterol Cal: 20 mg/dL (ref 5–40)

## 2020-04-30 MED ORDER — CLONIDINE HCL 0.2 MG PO TABS: 0.2000 mg | ORAL_TABLET | Freq: Two times a day (BID) | ORAL | 3 refills | Status: DC

## 2020-04-30 NOTE — Patient Instructions (Signed)
Medication Instructions:  No changes  *If you need a refill on your cardiac medications before your next appointment, please call your pharmacy*   Lab Work: Today: lipids  If you have labs (blood work) drawn today and your tests are completely normal, you will receive your results only by: Marland Kitchen MyChart Message (if you have MyChart) OR . A paper copy in the mail If you have any lab test that is abnormal or we need to change your treatment, we will call you to review the results.   Testing/Procedures: none   Follow-Up: At [facility], you and your health needs are our priority.  As part of our continuing mission to provide you with exceptional heart care, we have created designated Provider Care Teams.  These Care Teams include your primary Cardiologist (physician) and Advanced Practice Providers (APPs -  Physician Assistants and Nurse Practitioners) who all work together to provide you with the care you need, when you need it.  Your next appointment:   12 month(s)  The format for your next appointment:   In Person  Provider:   You may see Dorris Carnes, MD or one of the following Advanced Practice Providers on your designated Care Team:    Richardson Dopp, PA-C  Robbie Lis, [place]    Other Instructions

## 2020-04-30 NOTE — Progress Notes (Signed)
Cardiology Office Note   Date:  04/30/2020   ID:  Raven Turner, DOB [birth year], MRN [id number]  PCP:  [name], NP  Cardiologist:   Dorris Carnes, MD   F/U of HTN and CAD    History of Present Illness: Raven Turner is a 49 y.o. female with a history of HTN, DM, COPD (severe), morbid obesity and chest pressure.  Hx of coronary calcifications on CT (LAD)  Myovue in 2016 showed no ischemia I saw her in Aug 2020  [facility] September 2020   THis showed mild carotid plaquing     Since seen her breathing is OK   She notes occasional L sided chest discomfort that started a couple weeks ago   Occurs without activity   AP ressure   She thinks it is superficial, along chest wall Otherwise her breathing is stable   No CP with actvity                                                                     Current Meds  Medication Sig  . albuterol (VENTOLIN HFA) 108 (90 Base) MCG/ACT inhaler Inhale 2 puffs into the lungs every 4 (four) hours as needed for wheezing or shortness of breath (((PLAN B))).  Marland Kitchen aspirin EC 81 MG tablet Take 1 tablet (81 mg total) by mouth daily.  . BD INSULIN SYRINGE U-500 31G X 6MM 0.5 ML MISC U UTD TID SUBCUTANEOUSLY  . Blood Glucose Monitoring Suppl (ACCU-CHEK AVIVA PLUS) W/DEVICE KIT Used as directed  . Buprenorphine HCl (BELBUCA) 450 MCG FILM Place inside cheek.  . cabergoline (DOSTINEX) 0.5 MG tablet Take 0.5 tablets (0.25 mg total) by mouth 2 (two) times a week.  . calcium carbonate (CALCIUM 600) 600 MG TABS tablet Take 1 tablet by mouth daily.  . cholecalciferol (VITAMIN D) 1000 UNITS tablet Take 1,000 Units by mouth every morning.  . cloNIDine (CATAPRES) 0.2 MG tablet Take 0.2 mg by mouth 2 (two) times daily.  Marland Kitchen dexlansoprazole (DEXILANT) 60 MG capsule Take 60 mg by mouth daily.  . Dupilumab (DUPIXENT) 300 MG/2ML SOPN Inject into the skin.  . folic acid (FOLVITE) 462 MCG tablet Take 400 mcg by mouth daily.  . furosemide (LASIX)  20 MG tablet Take 1 tablet (20 mg total) by mouth every morning.  . gabapentin (NEURONTIN) 400 MG capsule Take 2 capsules (800 mg total) by mouth 3 (three) times daily.  Marland Kitchen glucose blood (RELION GLUCOSE TEST STRIPS) test strip 1 each by Other route 4 (four) times daily. ICD code E11.65  . HUMULIN R U-500 KWIKPEN 500 UNIT/ML kwikpen Inject 120-180 Units into the skin 3 (three) times daily. 180 U before breakfast, 160 U before lunch, 140 U before dinner (Patient taking differently: Inject 120-180 Units into the skin 3 (three) times daily. 130 U before breakfast, 110 U before lunch, 90 U before dinner)  . hydrOXYzine (ATARAX/VISTARIL) 25 MG tablet Take 25 mg by mouth at bedtime as needed.  Elmore Guise Devices (ACCU-CHEK SOFTCLIX) lancets Use as instructed  . levothyroxine (SYNTHROID, LEVOTHROID) 75 MCG tablet TK 1 T PO QD IN THE MORNING OES  . linaclotide (LINZESS) 145 MCG CAPS capsule Take 145 mcg by mouth daily before breakfast.  .  losartan (COZAAR) 100 MG tablet Take 1 tablet (100 mg total) by mouth every morning.  . methocarbamol (ROBAXIN) 750 MG tablet Take 1 tablet (750 mg total) by mouth 3 (three) times daily. MUST MAKE APPT FOR FURTHER REFILLS  . methotrexate (RHEUMATREX) 2.5 MG tablet Take 2.5 mg by mouth once a week. 6 pills wkly .Caution:Chemotherapy. Protect from light.  . Misc. Devices (CANE) MISC 1 each by Does not apply route daily.  . mometasone-formoterol (DULERA) 200-5 MCG/ACT AERO Inhale 2 puffs into the lungs 2 (two) times daily.  . montelukast (SINGULAIR) 10 MG tablet Take 1 tablet (10 mg total) by mouth at bedtime.  Marland Kitchen PARoxetine (PAXIL) 20 MG tablet Take 1 tablet (20 mg total) by mouth daily. MUST MAKE APPT FOR FURTHER REFILLS  . predniSONE (DELTASONE) 1 MG tablet Take 2 tablets (2 mg total) by mouth daily.  . rosuvastatin (CRESTOR) 20 MG tablet Take 1 tablet (20 mg total) by mouth daily.  Marland Kitchen spironolactone (ALDACTONE) 25 MG tablet Take 25 mg by mouth daily.  . Tiotropium Bromide  Monohydrate (SPIRIVA RESPIMAT) 1.25 MCG/ACT AERS Inhale 2 puffs into the lungs daily.  . traMADol (ULTRAM) 50 MG tablet Take by mouth every 6 (six) hours as needed.  . traZODone (DESYREL) 50 MG tablet Take 50 mg by mouth at bedtime.  . verapamil (CALAN-SR) 180 MG CR tablet Take 2 tablets (360 mg total) by mouth at bedtime.   Current Facility-Administered Medications for the 04/30/20 encounter (Office Visit) with Fay Records, MD  Medication  . betamethasone acetate-betamethasone sodium phosphate (CELESTONE) injection 3 mg  . umeclidinium bromide (INCRUSE ELLIPTA) 62.5 MCG/INH 1 puff     Allergies:   Hydrocortisone, Shellfish allergy, and Sulfamethoxazole-trimethoprim   Past Medical History:  Diagnosis Date  . Anemia   . Asthma   . Bilateral hand numbness 06/25/2015  . Bone pain 02/27/2015   Diffuse bone pain in legs mostly but also in arms    . Breast pain, left 03/27/2015  . Callus of heel 12/16/2014  . Compression fracture   . Compression fracture of L1 lumbar vertebra (HCC) 08/26/2014  . Compression fracture of T12 vertebra ([place]) 08/28/2014  . COPD (chronic obstructive pulmonary disease) ([place]) 2013  . Cushing syndrome ([place]) 2013   . Depression   . Diabetes ([place]) 2013   . Diabetes mellitus type 2, uncontrolled ([place]) 08/26/2014   Sees Dr. Katy Fitch.  Last visit 11/27/14.  No diabetic retinopathy    . Diabetes mellitus with neuropathy ([place])   . Diastolic dysfunction without heart failure 10/14/2014   Grade 2 diastolic dysfunction 2/2 pulm HTN   . Generalized anxiety disorder 03/27/2015  . HTN (hypertension) 2005   . Hx of cataract surgery 11/03/2014  . LUQ abdominal pain 07/17/2015  . Medication management 05/12/2015  . Morbid obesity ([place]) 09/08/2014  . 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 Non-English speaking patient 09/08/2014  . OSA (obstructive sleep apnea) 05/20/2015  . Osteopenia 11/19/2013   Dx with osteopenia in lumbar vertebra with partial compression of L1  . Pain due to onychomycosis  of toenail 03/27/2015  . Secondary Cushing's syndrome/ iatrogenic   . Skin rash 07/17/2015  . Tachycardia 01/13/2015  . Upper airway cough syndrome 09/05/2014   Followed in Pulmonary clinic/ [facility]/ [place]  - Trial off advair      09/05/2014 >>>  - trial off theoph 09/17/2014 and added flutter  - rec reduce dulera to 100 2 bid 07/13/2015     . Vision changes 06/25/2015  .  Vitamin D deficiency 08/28/2014    Past Surgical History:  Procedure Laterality Date  . CATARACT EXTRACTION Bilateral 2014  . HAND SURGERY Right 12/18/2015  . TUBAL LIGATION    . VAGINAL HYSTERECTOMY       Social History:  The patient  reports that she has never smoked. She has never used smokeless tobacco. She reports that she does not drink alcohol and does not use drugs.   Family History:  The patient's family history includes Asthma in her father; Diabetes in her mother; Heart disease in her father; High Cholesterol in her father and mother; High blood pressure in her father and mother; Hypertension in her brother and sister; Stroke in her father; Sudden death in her father.    ROS:  Please see the history of present illness. All other systems are reviewed and  Negative to the above problem except as noted.    PHYSICAL EXAM: VS:  BP 124/62   Pulse 84   Ht _0  (1.397 m)   Wt 193 lb (87.5 kg)   SpO2 97%   BMI 44.86 kg/m   GEN: Morbidly obese 49 yo in no acute distress  Neck: JVP normal  NO, carotid bruits, or masses Cardiac: RRR; Gr II/VI systolic murmur LSB   No LE edema  Chest:  MIld tendrness over L breast  Respiratory: Mild exp wheeze  Decreased airflow  GI: soft obese   Skin: warm and dry, no rash Neuro:  Strength and sensation are intact Psych: euthymic mood, full affect   EKG:  EKG is not  ordered today.   Stress test 04/2015  Nuclear stress EF: 58%.  There was no ST segment deviation noted during stress.  This is a low risk study.  The left ventricular ejection fraction is  normal (55-65%).  There is apical thinning but no evidence of infarction or ischemia.  Echo 10/2014 - Left ventricle: The cavity size was normal. Wall thickness was increased in a pattern of mild LVH. Systolic function was normal. The estimated ejection fraction was in the range of 50% to 55%. Wall motion was normal; there were no regional wall motion abnormalities. Features are consistent with a pseudonormal left ventricular filling pattern, with concomitant abnormal relaxation and increased filling pressure (grade 2 diastolic dysfunction). - Aortic valve: There was trivial regurgitation. . Lipid Panel    Component Value Date/Time   CHOL 118 04/06/2020 1410   TRIG 105 04/06/2020 1410   HDL 44 04/06/2020 1410   CHOLHDL 2.7 07/05/2019 1553   CHOLHDL 4 12/15/2014 1051   VLDL 38.4 12/15/2014 1051   LDLCALC 55 04/06/2020 1410      Wt Readings from Last 3 Encounters:  04/30/20 193 lb (87.5 kg)  04/21/20 188 lb (85.3 kg)  04/06/20 192 lb (87.1 kg)      ASSESSMENT AND PLAN:  1  Chest pressure atypical  Appears to be chest wall pain  2  CAD   Coronary calcifications on CT scan  I am not convinced of angina  3  Pulm   No recent flare    4  HTN  Continue to follow BP     5  PV dz   MIld plaquing   Check chol   Keep on statin   Current medicines are reviewed at length with the patient today.  The patient does not have concerns regarding medicines.  Signed, Dorris Carnes, MD  04/30/2020 9:32 AM    [address],  [place], [place]  [address] Phone: [phone]; Fax: [phone]

## 2020-05-04 ENCOUNTER — Encounter (INDEPENDENT_AMBULATORY_CARE_PROVIDER_SITE_OTHER): Payer: Self-pay

## 2020-05-05 ENCOUNTER — Ambulatory Visit (INDEPENDENT_AMBULATORY_CARE_PROVIDER_SITE_OTHER): Payer: Medicaid Other | Admitting: Bariatrics

## 2020-05-11 ENCOUNTER — Ambulatory Visit: Payer: Medicaid Other | Admitting: Pulmonary Disease

## 2020-05-12 ENCOUNTER — Ambulatory Visit: Payer: Medicaid Other | Admitting: Pulmonary Disease

## 2020-05-12 ENCOUNTER — Other Ambulatory Visit: Payer: Self-pay

## 2020-05-12 ENCOUNTER — Encounter: Payer: Self-pay | Admitting: Pulmonary Disease

## 2020-05-12 VITALS — BP 124/70 | HR 86 | Temp 97.0°F | Ht <= 58 in | Wt 191.0 lb

## 2020-05-12 DIAGNOSIS — Z7952 Long term (current) use of systemic steroids: Secondary | ICD-10-CM

## 2020-05-12 DIAGNOSIS — J4550 Severe persistent asthma, uncomplicated: Secondary | ICD-10-CM

## 2020-05-12 NOTE — Progress Notes (Signed)
_0  ID: Raven Turner, female    DOB: 1970-11-03, 49 y.o.   MRN: [id number]  Chief Complaint  Patient presents with  . Follow-up    pt states no compliants, asthma follow up     Referring provider: [name], NP  HPI:  49 year old female never smoker followed in our office for severe persistent asthma  PMH: Type 2 diabetes, vitamin D deficiency, morbid obesity, [place], hypertension, Smoker/ Smoking History: Never smoker Maintenance: Dulera 200, Spiriva Respimat 1.25, Dupixent, prednisone 1 mg daily Pt of: Dr. Loanne Drilling  05/12/2020  - Visit   49 year old female never smoker upon our office for asthma.  At last office visit in June/2021 it was recommended that patient titrate prednisone down to 1 mg a day.  Continue on Dupixent.  Patient presenting to office today as a follow-up visit.  Patient reports that her breathing has been stable since last being seen in our office.  She is down to 1 mg of prednisone daily.  ACT score today has improved to 18.  She remains adherent to Dulera 200, Spiriva Respimat 1.25, Dupixent.  She uses her rescue [place] about 2 times a week.  Only respiratory symptoms she is reporting today is occasional wheezing with physical exertion.  Questionaires / Pulmonary Flowsheets:   ACT:  Asthma Control Test ACT Total Score  05/12/2020 18  03/09/2020 17    MMRC: No flowsheet data found.  [facility]:  No flowsheet data found.  Tests:   FENO:  Lab Results  Component Value Date   NITRICOXIDE 16 07/05/2016    PFT: PFT Results Latest Ref Rng & Units 02/01/2018 11/03/2014  FVC-Pre L 2.03 1.84  FVC-Predicted Pre % 78 70  FVC-Post L 1.99 1.83  FVC-Predicted Post % 76 70  Pre FEV1/FVC % % 81 83  Post FEV1/FCV % % 87 85  FEV1-Pre L 1.65 1.52  FEV1-Predicted Pre % 78 71  FEV1-Post L 1.73 1.56  DLCO uncorrected ml/min/mmHg 15.96 19.95  DLCO UNC% % 131 164  DLVA Predicted % 169 201  TLC L 3.83 3.47  TLC % Predicted % 103 93  RV %  Predicted % 139 107    WALK:  SIX MIN WALK 12/15/2014  Supplimental Oxygen during Test? (L/min) No  Tech Comments: normal pace/stopped twice due to increase SOB//lmr    Imaging: No results found.  Lab Results:  CBC    Component Value Date/Time   WBC 10.6 (H) 03/17/2020 0911   RBC 4.09 03/17/2020 0911   HGB 11.6 (L) 03/17/2020 0911   HGB 11.0 (L) 09/16/2019 0847   HGB 11.8 04/15/2019 1231   HCT 36.4 03/17/2020 0911   HCT 36.4 04/15/2019 1231   PLT 354 03/17/2020 0911   PLT 398 09/16/2019 0847   PLT 451 (H) 04/15/2019 1231   MCV 89.0 03/17/2020 0911   MCV 84 04/15/2019 1231   MCH 28.4 03/17/2020 0911   MCHC 31.9 03/17/2020 0911   RDW 14.6 03/17/2020 0911   RDW 14.5 04/15/2019 1231   LYMPHSABS 3.8 03/17/2020 0911   LYMPHSABS 3.8 (H) 05/09/2018 1126   MONOABS 0.7 03/17/2020 0911   EOSABS 0.4 03/17/2020 0911   EOSABS 0.4 05/09/2018 1126   BASOSABS 0.1 03/17/2020 0911   BASOSABS 0.1 05/09/2018 1126    BMET    Component Value Date/Time   NA 139 04/06/2020 1410   K 4.4 04/06/2020 1410   CL 101 04/06/2020 1410   CO2 25 04/06/2020 1410   GLUCOSE 107 (H) 04/06/2020 1410  GLUCOSE 129 (H) 12/11/2019 1955   BUN 12 04/06/2020 1410   CREATININE 0.81 04/06/2020 1410   CREATININE 1.09 (H) 07/23/2018 1026   CREATININE 0.84 11/07/2016 1641   CALCIUM 9.2 04/06/2020 1410   GFRNONAA 87 04/06/2020 1410   GFRNONAA 60 (L) 07/23/2018 1026   GFRNONAA 85 11/07/2016 1641   GFRAA 100 04/06/2020 1410   GFRAA >60 07/23/2018 1026   GFRAA >89 11/07/2016 1641    BNP    Component Value Date/Time   BNP 53.3 09/08/2014 2213    ProBNP    Component Value Date/Time   PROBNP 28.0 02/16/2015 0930    Specialty Problems      Pulmonary Problems   Upper airway cough syndrome    Followed in Pulmonary clinic/ [facility]/ [place]  - Trial off advair      09/05/2014 >>>  - trial off theoph 09/17/2014 and added flutter  - rec reduce dulera to 100 2 bid 07/13/2015  - added  chlorpheniramine 4 mg 2 at hs for noct "wheeze"  12/11/2015 >>>          OSA (obstructive sleep apnea)   Severe persistent asthma without complication      Allergies  Allergen Reactions  . Hydrocortisone Rash and Hives  . Shellfish Allergy Anaphylaxis and Hives  . Sulfamethoxazole-Trimethoprim Rash, Other (See Comments) and Itching    Facial redness with pruritus  Facial redness with pruritus     Immunization History  Administered Date(s) Administered  . Hepatitis B 03/23/2015, 07/06/2015  . Influenza Split 05/25/2015  . Influenza,inj,Quad PF,6+ Mos 05/13/2016, 05/29/2017, 07/13/2018, 06/03/2019  . Influenza-Unspecified 05/26/2019  . PFIZER SARS-COV-2 Vaccination 12/02/2019, 12/23/2019  . Pneumococcal Polysaccharide-23 07/31/2017  . Pneumococcal-Unspecified 04/12/2012  . Tdap 06/24/2015   We recommend the seasonal flu vaccine when available  Patient is not due for the pneumonia vaccine at this time based off this documentation  Past Medical History:  Diagnosis Date  . Anemia   . Asthma   . Bilateral hand numbness 06/25/2015  . Bone pain 02/27/2015   Diffuse bone pain in legs mostly but also in arms    . Breast pain, left 03/27/2015  . Callus of heel 12/16/2014  . Compression fracture   . Compression fracture of L1 lumbar vertebra (HCC) 08/26/2014  . Compression fracture of T12 vertebra ([place]) 08/28/2014  . COPD (chronic obstructive pulmonary disease) ([place]) 2013  . Cushing syndrome ([place]) 2013   . Depression   . Diabetes ([place]) 2013   . Diabetes mellitus type 2, uncontrolled ([place]) 08/26/2014   Sees Dr. Katy Fitch.  Last visit 11/27/14.  No diabetic retinopathy    . Diabetes mellitus with neuropathy ([place])   . Diastolic dysfunction without heart failure 10/14/2014   Grade 2 diastolic dysfunction 2/2 pulm HTN   . Generalized anxiety disorder 03/27/2015  . HTN (hypertension) 2005   . Hx of cataract surgery 11/03/2014  . LUQ abdominal pain 07/17/2015  . Medication management  05/12/2015  . Morbid obesity ([place]) 09/08/2014  . 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 Non-English speaking patient 09/08/2014  . OSA (obstructive sleep apnea) 05/20/2015  . Osteopenia 11/19/2013   Dx with osteopenia in lumbar vertebra with partial compression of L1  . Pain due to onychomycosis of toenail 03/27/2015  . Secondary Cushing's syndrome/ iatrogenic   . Skin rash 07/17/2015  . Tachycardia 01/13/2015  . Upper airway cough syndrome 09/05/2014   Followed in Pulmonary clinic/ [facility]/ [place]  - Trial off advair      09/05/2014 >>>  -  trial off theoph 09/17/2014 and added flutter  - rec reduce dulera to 100 2 bid 07/13/2015     . Vision changes 06/25/2015  . Vitamin D deficiency 08/28/2014    Tobacco History: Social History   Tobacco Use  Smoking Status Never Smoker  Smokeless Tobacco Never Used   Counseling given: Not Answered   Continue to not smoke  Outpatient Encounter Medications as of 05/12/2020  Medication Sig  . albuterol (VENTOLIN HFA) 108 (90 Base) MCG/ACT inhaler Inhale 2 puffs into the lungs every 4 (four) hours as needed for wheezing or shortness of breath (((PLAN B))).  Marland Kitchen aspirin EC 81 MG tablet Take 1 tablet (81 mg total) by mouth daily.  . BD INSULIN SYRINGE U-500 31G X 6MM 0.5 ML MISC U UTD TID SUBCUTANEOUSLY  . Blood Glucose Monitoring Suppl (ACCU-CHEK AVIVA PLUS) W/DEVICE KIT Used as directed  . Buprenorphine HCl (BELBUCA) 450 MCG FILM Place inside cheek.  . cabergoline (DOSTINEX) 0.5 MG tablet Take 0.5 tablets (0.25 mg total) by mouth 2 (two) times a week.  . calcium carbonate (CALCIUM 600) 600 MG TABS tablet Take 1 tablet by mouth daily.  . cholecalciferol (VITAMIN D) 1000 UNITS tablet Take 1,000 Units by mouth every morning.  . cloNIDine (CATAPRES) 0.2 MG tablet Take 1 tablet (0.2 mg total) by mouth 2 (two) times daily.  Marland Kitchen dexlansoprazole (DEXILANT) 60 MG capsule Take 60 mg by mouth daily.  . Dupilumab (DUPIXENT) 300 MG/2ML SOPN Inject into the skin.  .  folic acid (FOLVITE) 027 MCG tablet Take 400 mcg by mouth daily.  . furosemide (LASIX) 20 MG tablet Take 1 tablet (20 mg total) by mouth every morning.  . gabapentin (NEURONTIN) 400 MG capsule Take 2 capsules (800 mg total) by mouth 3 (three) times daily.  Marland Kitchen glucose blood (RELION GLUCOSE TEST STRIPS) test strip 1 each by Other route 4 (four) times daily. ICD code E11.65  . HUMULIN R U-500 KWIKPEN 500 UNIT/ML kwikpen Inject 120-180 Units into the skin 3 (three) times daily. 180 U before breakfast, 160 U before lunch, 140 U before dinner (Patient taking differently: Inject 120-180 Units into the skin 3 (three) times daily. 130 U before breakfast, 110 U before lunch, 90 U before dinner)  . hydrOXYzine (ATARAX/VISTARIL) 25 MG tablet Take 25 mg by mouth at bedtime as needed.  Elmore Guise Devices (ACCU-CHEK SOFTCLIX) lancets Use as instructed  . levothyroxine (SYNTHROID, LEVOTHROID) 75 MCG tablet TK 1 T PO QD IN THE MORNING OES  . linaclotide (LINZESS) 145 MCG CAPS capsule Take 145 mcg by mouth daily before breakfast.  . losartan (COZAAR) 100 MG tablet Take 1 tablet (100 mg total) by mouth every morning.  . methocarbamol (ROBAXIN) 750 MG tablet Take 1 tablet (750 mg total) by mouth 3 (three) times daily. MUST MAKE APPT FOR FURTHER REFILLS  . methotrexate (RHEUMATREX) 2.5 MG tablet Take 2.5 mg by mouth once a week. 6 pills wkly .Caution:Chemotherapy. Protect from light.  . Misc. Devices (CANE) MISC 1 each by Does not apply route daily.  . mometasone-formoterol (DULERA) 200-5 MCG/ACT AERO Inhale 2 puffs into the lungs 2 (two) times daily.  . montelukast (SINGULAIR) 10 MG tablet Take 1 tablet (10 mg total) by mouth at bedtime.  Marland Kitchen PARoxetine (PAXIL) 20 MG tablet Take 1 tablet (20 mg total) by mouth daily. MUST MAKE APPT FOR FURTHER REFILLS  . predniSONE (DELTASONE) 1 MG tablet Take 2 tablets (2 mg total) by mouth daily.  . rosuvastatin (CRESTOR) 20 MG tablet  Take 1 tablet (20 mg total) by mouth daily.  Marland Kitchen  spironolactone (ALDACTONE) 25 MG tablet Take 25 mg by mouth daily.  . Tiotropium Bromide Monohydrate (SPIRIVA RESPIMAT) 1.25 MCG/ACT AERS Inhale 2 puffs into the lungs daily.  . traMADol (ULTRAM) 50 MG tablet Take by mouth every 6 (six) hours as needed.  . traZODone (DESYREL) 50 MG tablet Take 50 mg by mouth at bedtime.  . verapamil (CALAN-SR) 180 MG CR tablet Take 2 tablets (360 mg total) by mouth at bedtime.  . nitroGLYCERIN (NITROSTAT) 0.4 MG SL tablet Place 1 tablet (0.4 mg total) under the tongue every 5 (five) minutes as needed.   Facility-Administered Encounter Medications as of 05/12/2020  Medication  . betamethasone acetate-betamethasone sodium phosphate (CELESTONE) injection 3 mg  . DOBUTamine (DOBUTREX) 1,000 mcg/mL in dextrose 5% 250 mL infusion  . umeclidinium bromide (INCRUSE ELLIPTA) 62.5 MCG/INH 1 puff     Review of Systems  Review of Systems  Constitutional: Negative for activity change, fatigue and fever.  HENT: Negative for sinus pressure, sinus pain and sore throat.   Respiratory: Positive for wheezing. Negative for cough and shortness of breath.   Cardiovascular: Negative for chest pain and palpitations.  Gastrointestinal: Negative for diarrhea, nausea and vomiting.  Musculoskeletal: Negative for arthralgias.  Neurological: Negative for dizziness.  Psychiatric/Behavioral: Negative for sleep disturbance. The patient is not nervous/anxious.      Physical Exam  BP 124/70 (BP Location: Left Arm, Cuff Size: Normal)   Pulse 86   Temp (!) 97 F (36.1 C) (Oral)   Ht 4' 8" (1.422 m)   Wt 191 lb (86.6 kg)   SpO2 98%   BMI 42.82 kg/m   Wt Readings from Last 5 Encounters:  05/12/20 191 lb (86.6 kg)  04/30/20 193 lb (87.5 kg)  04/21/20 188 lb (85.3 kg)  04/06/20 192 lb (87.1 kg)  03/17/20 193 lb (87.5 kg)    BMI Readings from Last 5 Encounters:  05/12/20 42.82 kg/m  04/30/20 44.86 kg/m  04/21/20 43.70 kg/m  04/06/20 44.63 kg/m  03/17/20 40.34 kg/m      Physical Exam Vitals and nursing note reviewed.  Constitutional:      General: She is not in acute distress.    Appearance: Normal appearance. She is obese.  HENT:     Head: Normocephalic and atraumatic.     Right Ear: Tympanic membrane, ear canal and external ear normal. There is no impacted cerumen.     Left Ear: Tympanic membrane, ear canal and external ear normal. There is no impacted cerumen.     Nose: Nose normal. No congestion or rhinorrhea.     Mouth/Throat:     Mouth: Mucous membranes are moist.     Pharynx: Oropharynx is clear.     Comments: MP 3 Eyes:     Pupils: Pupils are equal, round, and reactive to light.  Cardiovascular:     Rate and Rhythm: Normal rate and regular rhythm.     Pulses: Normal pulses.     Heart sounds: Normal heart sounds. No murmur heard.   Pulmonary:     Effort: Pulmonary effort is normal. No respiratory distress.     Breath sounds: No decreased air movement. Wheezing (Slight expiratory wheeze) present. No decreased breath sounds or rales.  Musculoskeletal:     Cervical back: Normal range of motion.  Skin:    General: Skin is warm and dry.     Capillary Refill: Capillary refill takes less than 2 seconds.  Neurological:  General: No focal deficit present.     Mental Status: She is alert and oriented to person, place, and time. Mental status is at baseline.     Gait: Gait (Walks with cane) normal.  Psychiatric:        Mood and Affect: Mood normal.        Behavior: Behavior normal.        Thought Content: Thought content normal.        Judgment: Judgment normal.       Assessment & Plan:   Severe persistent asthma without complication ACT score today 18 Maintained on daily prednisone at 1 mg Currently using Dulera 200 and Spiriva Respimat 1.25 Plan: Continue Dulera 200 Continue Spiriva Respimat 1.25 Stop prednisone daily use completely Notify our office in 2 to 4 weeks on how you are doing off prednisone 6 to 8-week follow-up  with Dr. Loanne Drilling Continue Dupixent   Morbid obesity ([place]) Plan: Continue to work on increasing your overall physical mobility and reducing BMI Goal should be to lose around 20% of current body weight this would be the equivalency of adding an inhaler for management of your asthma  Current chronic use of systemic steroids Given risk of chronic systemic steroids and patient stability and breathing will work with patient today to stop chronic steroid use  Plan: Stop daily prednisone use at this time Notify our office in 2 to 4 weeks on how you are doing off of steroids Close follow-up with our office in 6 to 8 weeks with Dr. Loanne Drilling    Return in about 2 months (around 07/12/2020), or if symptoms worsen or fail to improve, for Follow up with Dr. Loanne Drilling.   Lauraine Rinne, NP 05/12/2020   This appointment required 32 minutes of patient care (this includes precharting, chart review, review of results, face-to-face care, etc.).

## 2020-05-12 NOTE — Assessment & Plan Note (Addendum)
ACT score today 18 Maintained on daily prednisone at 1 mg Currently using Dulera 200 and Spiriva Respimat 1.25 Plan: Continue Dulera 200 Continue Spiriva Respimat 1.25 Stop prednisone daily use completely Notify our office in 2 to 4 weeks on how you are doing off prednisone 6 to 8-week follow-up with Dr. Loanne Drilling Continue Dupixent

## 2020-05-12 NOTE — Patient Instructions (Addendum)
You were seen today by Lauraine Rinne, NP  for:   1. Severe persistent asthma without complication  Dulera 903 >>> 2 puffs in the morning right when you wake up, rinse out your mouth after use, 12 hours later 2 puffs, rinse after use >>> Take this daily, no matter what >>> This is not a rescue inhaler   Spiriva Respimat 1.25 >>> 2 puffs daily >>> Do this every day >>>This is not a rescue inhaler  Continue Dupixent  We recommend that you stop using your prednisone at this time given the stability of your breathing as well as the maintained on Dupixent  2. Current chronic use of systemic steroids  Please stop taking your daily prednisone at this time  Notify our office in 2 to 4 weeks to let us know how you are doing off of prednisone  We will see you back in 6 to 8 weeks with Dr. Loanne Drilling  3.  Morbid obesity ([place])  Continue to work on increasing your overall physical activity   Follow Up:    Return in about 2 months (around 07/12/2020), or if symptoms worsen or fail to improve, for Follow up with Dr. Loanne Drilling.   Please do your part to reduce the spread of COVID-19:      Reduce your risk of any infection  and COVID19 by using the similar precautions used for avoiding the common cold or flu:  Marland Kitchen Wash your hands often with soap and warm water for at least 20 seconds.  If soap and water are not readily available, use an alcohol-based hand sanitizer with at least 60% alcohol.  . If coughing or sneezing, cover your mouth and nose by coughing or sneezing into the elbow areas of your shirt or coat, into a tissue or into your sleeve (not your hands). Langley Gauss A MASK when in public  . Avoid shaking hands with others and consider head nods or verbal greetings only. . Avoid touching your eyes, nose, or mouth with unwashed hands.  . Avoid close contact with people who are sick. . Avoid places or events with large numbers of people in one location, like concerts or sporting events. . If  you have some symptoms but not all symptoms, continue to monitor at home and seek medical attention if your symptoms worsen. . If you are having a medical emergency, call 911.   [place] / e-Visit: [email or web address]         [facility]: [facility]: [phone]                   [facility]: [phone]     It is flu season:   >>> Best ways to protect herself from the flu: Receive the yearly flu vaccine, practice good hand hygiene washing with soap and also using hand sanitizer when available, eat a nutritious meals, get adequate rest, hydrate appropriately   Please contact the office if your symptoms worsen or you have concerns that you are not improving.   Thank you for choosing [facility] for your healthcare, and for allowing [place] to partner with you on your healthcare journey. I am thankful to be able to provide care to you today.   Wyn Quaker FNP-C

## 2020-05-12 NOTE — Assessment & Plan Note (Signed)
Given risk of chronic systemic steroids and patient stability and breathing will work with patient today to stop chronic steroid use  Plan: Stop daily prednisone use at this time Notify our office in 2 to 4 weeks on how you are doing off of steroids Close follow-up with our office in 6 to 8 weeks with Dr. Loanne Drilling

## 2020-05-12 NOTE — Assessment & Plan Note (Signed)
Plan: Continue to work on increasing your overall physical mobility and reducing BMI Goal should be to lose around 20% of current body weight this would be the equivalency of adding an inhaler for management of your asthma

## 2020-06-05 ENCOUNTER — Telehealth: Payer: Self-pay | Admitting: Pulmonary Disease

## 2020-06-10 MED ORDER — DUPIXENT 300 MG/2ML ~~LOC~~ SOSY: 300.0000 mg | PREFILLED_SYRINGE | Freq: Once | SUBCUTANEOUS | 6 refills | Status: DC

## 2020-06-10 NOTE — Telephone Encounter (Signed)
Called and spoke with [facility].  [facility] stated Patient is needing a new prescription, with refills for Dupixent 300mg  prefilled syringe. New Prescription sent to [facility].  Nothing further at this time.

## 2020-07-10 ENCOUNTER — Ambulatory Visit: Payer: Medicaid Other | Admitting: Pulmonary Disease

## 2020-07-10 ENCOUNTER — Other Ambulatory Visit: Payer: Self-pay

## 2020-07-10 ENCOUNTER — Encounter: Payer: Self-pay | Admitting: Pulmonary Disease

## 2020-07-10 DIAGNOSIS — J4550 Severe persistent asthma, uncomplicated: Secondary | ICD-10-CM | POA: Diagnosis not present

## 2020-07-10 DIAGNOSIS — J45998 Other asthma: Secondary | ICD-10-CM | POA: Diagnosis not present

## 2020-07-10 MED ORDER — ALBUTEROL SULFATE HFA 108 (90 BASE) MCG/ACT IN AERS: 2.0000 | INHALATION_SPRAY | RESPIRATORY_TRACT | 6 refills | Status: DC | PRN

## 2020-07-10 MED ORDER — MONTELUKAST SODIUM 10 MG PO TABS: 10.0000 mg | ORAL_TABLET | Freq: Every day | ORAL | 6 refills | Status: DC

## 2020-07-10 MED ORDER — MOMETASONE FURO-FORMOTEROL FUM 200-5 MCG/ACT IN AERO
2.0000 | INHALATION_SPRAY | Freq: Two times a day (BID) | RESPIRATORY_TRACT | 3 refills | Status: DC
Start: 2020-07-10 — End: 2020-08-13

## 2020-07-10 MED ORDER — PREDNISONE 10 MG PO TABS: ORAL_TABLET | ORAL | 0 refills | Status: AC

## 2020-07-10 MED ORDER — SPIRIVA RESPIMAT 1.25 MCG/ACT IN AERS
2.0000 | INHALATION_SPRAY | Freq: Every day | RESPIRATORY_TRACT | 3 refills | Status: DC
Start: 2020-07-10 — End: 2021-04-23

## 2020-07-10 NOTE — Patient Instructions (Signed)
Severe Persistent Asthma - uncontrolled START Prednisone 30 mg x 3 days, 20 mg x 3 days, 10 mg x 3 days, then STOP REFILL Dulera 200-5 TWO puffs TWICE a day REFILL Spiriva 1.5 mcg TWO puffs ONCE a day REFILL/CONTINUE Albuterol as needed for shortness of breath or wheezing REFILL/CONTINUE Singulair ONCE a day CONTINUE Dupixent as scheduled  Follow-up with me in 3 months

## 2020-07-10 NOTE — Progress Notes (Signed)
Subjective:   PATIENT ID: [facility] GENDER: female DOB: 09-14-1970, MRN: [id number]   HPI  Chief Complaint  Patient presents with  . Follow-up    Patient feels like her chest is tight over the last 2-3 days, denies cough, SOB, wheezing    Reason for Visit: Follow-up   Raven [facility] is a 49 year-old Spanish speaking female never smoker who presents follow-up of severe persistent asthma. Translator present during interview.  Since our last visit with me (Dr. Loanne Drilling), she has been weaned off steroids. She has been compliant with her Dupixent and Dulera and Spiriva. She reports needing her albuterol daily in the last several days associated with chest tightness and some nocturnal wheezing. Denies cough. She notices near the end/beginning of her [place], she has some chest congestion that she will usually treat with home remedies and this will resolve.  When she lived in [place], she stated she "lived" in the hospital. Last hospitalization in 2015.   I have personally reviewed patient's past medical/family/social history/allergies/current medications.  Past Medical History:  Diagnosis Date  . Anemia   . Asthma   . Bilateral hand numbness 06/25/2015  . Bone pain 02/27/2015   Diffuse bone pain in legs mostly but also in arms    . Breast pain, left 03/27/2015  . Callus of heel 12/16/2014  . Compression fracture   . Compression fracture of L1 lumbar vertebra (HCC) 08/26/2014  . Compression fracture of T12 vertebra ([place]) 08/28/2014  . COPD (chronic obstructive pulmonary disease) ([place]) 2013  . Cushing syndrome ([place]) 2013   . Depression   . Diabetes ([place]) 2013   . Diabetes mellitus type 2, uncontrolled ([place]) 08/26/2014   Sees Dr. Katy Fitch.  Last visit 11/27/14.  No diabetic retinopathy    . Diabetes mellitus with neuropathy ([place])   . Diastolic dysfunction without heart failure 10/14/2014   Grade 2 diastolic dysfunction 2/2 pulm HTN   . Generalized anxiety  disorder 03/27/2015  . HTN (hypertension) 2005   . Hx of cataract surgery 11/03/2014  . LUQ abdominal pain 07/17/2015  . Medication management 05/12/2015  . Morbid obesity ([place]) 09/08/2014  . 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 Non-English speaking patient 09/08/2014  . OSA (obstructive sleep apnea) 05/20/2015  . Osteopenia 11/19/2013   Dx with osteopenia in lumbar vertebra with partial compression of L1  . Pain due to onychomycosis of toenail 03/27/2015  . Secondary Cushing's syndrome/ iatrogenic   . Skin rash 07/17/2015  . Tachycardia 01/13/2015  . Upper airway cough syndrome 09/05/2014   Followed in Pulmonary clinic/ [facility]/ [place]  - Trial off advair      09/05/2014 >>>  - trial off theoph 09/17/2014 and added flutter  - rec reduce dulera to 100 2 bid 07/13/2015     . Vision changes 06/25/2015  . Vitamin D deficiency 08/28/2014     Family History  Problem Relation Age of Onset  . Diabetes Mother   . High blood pressure Mother   . High Cholesterol Mother   . Stroke Father   . Asthma Father   . High blood pressure Father   . High Cholesterol Father   . Heart disease Father   . Sudden death Father   . Hypertension Sister   . Hypertension Brother      Social History   Occupational History  . Occupation: stay at home  Tobacco Use  . Smoking status: Never Smoker  . Smokeless tobacco: Never Used  Vaping  Use  . Vaping Use: Never used  Substance and Sexual Activity  . Alcohol use: No    Alcohol/week: 0.0 standard drinks  . Drug use: No  . Sexual activity: Yes    Birth control/protection: Surgical    Allergies  Allergen Reactions  . Hydrocortisone Rash and Hives  . Shellfish Allergy Anaphylaxis and Hives  . Sulfamethoxazole-Trimethoprim Rash, Other (See Comments) and Itching    Facial redness with pruritus  Facial redness with pruritus      Outpatient Medications Prior to Visit  Medication Sig Dispense Refill  . albuterol (VENTOLIN HFA) 108 (90 Base) MCG/ACT inhaler  Inhale 2 puffs into the lungs every 4 (four) hours as needed for wheezing or shortness of breath (((PLAN B))). 18 g 6  . aspirin EC 81 MG tablet Take 1 tablet (81 mg total) by mouth daily. 90 tablet 3  . BD INSULIN SYRINGE U-500 31G X 6MM 0.5 ML MISC U UTD TID SUBCUTANEOUSLY    . Blood Glucose Monitoring Suppl (ACCU-CHEK AVIVA PLUS) W/DEVICE KIT Used as directed 1 kit 0  . Buprenorphine HCl (BELBUCA) 450 MCG FILM Place inside cheek.    . cabergoline (DOSTINEX) 0.5 MG tablet Take 0.5 tablets (0.25 mg total) by mouth 2 (two) times a week. 10 tablet 3  . calcium carbonate (CALCIUM 600) 600 MG TABS tablet Take 1 tablet by mouth daily.    . cholecalciferol (VITAMIN D) 1000 UNITS tablet Take 1,000 Units by mouth every morning.    . cloNIDine (CATAPRES) 0.2 MG tablet Take 1 tablet (0.2 mg total) by mouth 2 (two) times daily. 180 tablet 3  . dexlansoprazole (DEXILANT) 60 MG capsule Take 60 mg by mouth daily.    . Dupilumab (DUPIXENT) 300 MG/2ML SOPN Inject into the skin.    . folic acid (FOLVITE) 893 MCG tablet Take 400 mcg by mouth daily.    . furosemide (LASIX) 20 MG tablet Take 1 tablet (20 mg total) by mouth every morning. 30 tablet 2  . gabapentin (NEURONTIN) 400 MG capsule Take 2 capsules (800 mg total) by mouth 3 (three) times daily. 180 capsule 3  . glucose blood (RELION GLUCOSE TEST STRIPS) test strip 1 each by Other route 4 (four) times daily. ICD code E11.65 400 each 3  . HUMULIN R U-500 KWIKPEN 500 UNIT/ML kwikpen Inject 120-180 Units into the skin 3 (three) times daily. 180 U before breakfast, 160 U before lunch, 140 U before dinner (Patient taking differently: Inject 120-180 Units into the skin 3 (three) times daily. 130 U before breakfast, 110 U before lunch, 90 U before dinner) 27 mL 11  . hydrOXYzine (ATARAX/VISTARIL) 25 MG tablet Take 25 mg by mouth at bedtime as needed.    Elmore Guise Devices (ACCU-CHEK SOFTCLIX) lancets Use as instructed 1 each 0  . levothyroxine (SYNTHROID, LEVOTHROID) 75  MCG tablet Take 112 mcg by mouth daily before breakfast.     . linaclotide (LINZESS) 145 MCG CAPS capsule Take 145 mcg by mouth daily before breakfast.    . losartan (COZAAR) 100 MG tablet Take 1 tablet (100 mg total) by mouth every morning. 30 tablet 6  . methocarbamol (ROBAXIN) 750 MG tablet Take 1 tablet (750 mg total) by mouth 3 (three) times daily. MUST MAKE APPT FOR FURTHER REFILLS 60 tablet 0  . methotrexate (RHEUMATREX) 2.5 MG tablet Take 2.5 mg by mouth once a week. 6 pills wkly .Caution:Chemotherapy. Protect from light.    . Misc. Devices (CANE) MISC 1 each by Does not apply  route daily. 1 each 0  . mometasone-formoterol (DULERA) 200-5 MCG/ACT AERO Inhale 2 puffs into the lungs 2 (two) times daily. 13 g 3  . montelukast (SINGULAIR) 10 MG tablet Take 1 tablet (10 mg total) by mouth at bedtime. 30 tablet 6  . nitroGLYCERIN (NITROSTAT) 0.4 MG SL tablet Place 1 tablet (0.4 mg total) under the tongue every 5 (five) minutes as needed. 25 tablet 3  . PARoxetine (PAXIL) 20 MG tablet Take 1 tablet (20 mg total) by mouth daily. MUST MAKE APPT FOR FURTHER REFILLS 30 tablet 1  . predniSONE (DELTASONE) 1 MG tablet Take 2 tablets (2 mg total) by mouth daily. 60 tablet 5  . rosuvastatin (CRESTOR) 20 MG tablet Take 1 tablet (20 mg total) by mouth daily. 90 tablet 3  . spironolactone (ALDACTONE) 25 MG tablet Take 25 mg by mouth daily.    . Tiotropium Bromide Monohydrate (SPIRIVA RESPIMAT) 1.25 MCG/ACT AERS Inhale 2 puffs into the lungs daily. 4 g 3  . traMADol (ULTRAM) 50 MG tablet Take by mouth every 6 (six) hours as needed.    . traZODone (DESYREL) 50 MG tablet Take 50 mg by mouth at bedtime.    . verapamil (CALAN-SR) 180 MG CR tablet Take 2 tablets (360 mg total) by mouth at bedtime. 180 tablet 2   Facility-Administered Medications Prior to Visit  Medication Dose Route Frequency Provider Last Rate Last Admin  . betamethasone acetate-betamethasone sodium phosphate (CELESTONE) injection 3 mg  3 mg  Intramuscular Once Daylene Katayama M, DPM      . DOBUTamine (DOBUTREX) 1,000 mcg/mL in dextrose 5% 250 mL infusion  30 mcg/kg/min Intravenous Continuous Croitoru, Mihai, MD 145.3 mL/hr at 04/22/15 1300 30 mcg/kg/min at 04/22/15 1300  . umeclidinium bromide (INCRUSE ELLIPTA) 62.5 MCG/INH 1 puff  1 puff Inhalation Daily Ladell Pier, MD        Review of Systems  Constitutional: Negative for chills, diaphoresis, fever, malaise/fatigue and weight loss.  HENT: Negative for congestion.   Respiratory: Positive for shortness of breath and wheezing. Negative for cough, hemoptysis and sputum production.   Cardiovascular: Positive for chest pain (chest tightness). Negative for palpitations and leg swelling.     Objective:   Vitals:   07/10/20 1138  BP: 120/68  Pulse: 100  Temp: 98 F (36.7 C)  TempSrc: Temporal  SpO2: 97%  Weight: 191 lb 6.4 oz (86.8 kg)  Height: 4' 10"  (1.473 m)   SpO2: 97 % O2 Device: None (Room air)  Data Reviewed:  Physical Exam: General: Obese, well-appearing, no acute distress HENT: [place], AT, OP clear, MMM Eyes: EOMI, no scleral icterus Respiratory: Clear to auscultation bilaterally.  No crackles, wheezing or rales Cardiovascular: RRR, -M/R/G, no JVD Extremities:-Edema,-tenderness Neuro: AAO x4, CNII-XII grossly intact Skin: Intact, no rashes or bruising Psych: Normal mood, normal affect  Imaging: CT Chest 11/25/18 - Mild air trapping. Normal parenchyma  PFT: 09/02/19 FVC 1.99 (76%) FEV1 1.73 (81%) Ratio 81  TLC 103% DLCO 131%. No significant bronchodilator effect. Interpretation: No obstructive defect or restrictive defect seen on spirometry. Elevated DLCO can be seen in asthma, obesity and large lung volumes.  Labs: Abs eos 03/17/20 - 400  Imaging, labs and test noted above have been reviewed independently by me.    Assessment & Plan:   Discussion: 49 year old female with severe persistent asthma, previously on chronic prednisone. On  Dupixent.  Severe Persistent Asthma - uncontrolled START Prednisone 30 mg x 3 days, 20 mg x 3 days, 10 mg  x 3 days, then STOP REFILL Dulera 200-5 TWO puffs TWICE a day REFILL Spiriva 1.5 mcg TWO puffs ONCE a day REFILL/CONTINUE Albuterol as needed for shortness of breath or wheezing REFILL/CONTINUE Singulair ONCE a day CONTINUE Dupixent as scheduledscheduled  Health Maintenance Immunization History  Administered Date(s) Administered  . Hepatitis B 03/23/2015, 07/06/2015  . Influenza Split 05/25/2015  . Influenza,inj,Quad PF,6+ Mos 05/13/2016, 05/29/2017, 07/13/2018, 06/03/2019, 06/17/2020  . Influenza-Unspecified 05/25/2015, 05/13/2016, 05/29/2017, 07/13/2018, 06/03/2019  . PFIZER SARS-COV-2 Vaccination 12/02/2019, 12/23/2019  . Pneumococcal Polysaccharide-23 07/31/2017  . Pneumococcal-Unspecified 04/12/2012  . Tdap 06/24/2015    No orders of the defined types were placed in this encounter.  Meds ordered this encounter  Medications  . albuterol (VENTOLIN HFA) 108 (90 Base) MCG/ACT inhaler    Sig: Inhale 2 puffs into the lungs every 4 (four) hours as needed for wheezing or shortness of breath (((PLAN B))).    Dispense:  18 g    Refill:  6  . mometasone-formoterol (DULERA) 200-5 MCG/ACT AERO    Sig: Inhale 2 puffs into the lungs 2 (two) times daily.    Dispense:  13 g    Refill:  3  . montelukast (SINGULAIR) 10 MG tablet    Sig: Take 1 tablet (10 mg total) by mouth at bedtime.    Dispense:  30 tablet    Refill:  6  . Tiotropium Bromide Monohydrate (SPIRIVA RESPIMAT) 1.25 MCG/ACT AERS    Sig: Inhale 2 puffs into the lungs daily.    Dispense:  4 g    Refill:  3  . predniSONE (DELTASONE) 10 MG tablet    Sig: Take 3 tablets (30 mg total) by mouth daily with breakfast for 3 days, THEN 2 tablets (20 mg total) daily with breakfast for 3 days, THEN 1 tablet (10 mg total) daily with breakfast for 3 days.    Dispense:  18 tablet    Refill:  0    Return in about 3 months (around  10/10/2020).  I have spent a total time of 31-minutes on the day of the appointment reviewing prior documentation, coordinating care and discussing medical diagnosis and plan with the patient/family. Imaging, labs and tests included in this note have been reviewed and interpreted independently by me.  [place], MD [facility] 07/10/2020 11:47 AM  Office Number [phone]

## 2020-08-12 ENCOUNTER — Other Ambulatory Visit: Payer: Self-pay | Admitting: Pulmonary Disease

## 2020-08-17 ENCOUNTER — Other Ambulatory Visit (HOSPITAL_COMMUNITY)
Admission: RE | Admit: 2020-08-17 | Discharge: 2020-08-17 | Disposition: A | Discharge status: Home / Self Care | Payer: Medicaid Other | Source: Ambulatory Visit | Attending: Obstetrics and Gynecology | Admitting: Obstetrics and Gynecology

## 2020-08-17 ENCOUNTER — Ambulatory Visit (INDEPENDENT_AMBULATORY_CARE_PROVIDER_SITE_OTHER): Payer: Medicaid Other | Admitting: Obstetrics and Gynecology

## 2020-08-17 ENCOUNTER — Other Ambulatory Visit: Payer: Self-pay

## 2020-08-17 ENCOUNTER — Encounter: Payer: Self-pay | Admitting: Obstetrics and Gynecology

## 2020-08-17 VITALS — BP 131/70 | HR 88 | Ht <= 58 in | Wt 189.3 lb

## 2020-08-17 DIAGNOSIS — Z789 Other specified health status: Secondary | ICD-10-CM | POA: Diagnosis not present

## 2020-08-17 DIAGNOSIS — N920 Excessive and frequent menstruation with regular cycle: Secondary | ICD-10-CM | POA: Insufficient documentation

## 2020-08-17 DIAGNOSIS — Z6841 Body Mass Index (BMI) 40.0 and over, adult: Secondary | ICD-10-CM

## 2020-08-17 NOTE — Progress Notes (Signed)
New patient is in the office. Last pap 2016 Hx of BTL  Pt states that she was referred by hematologist r/t heavy bleeding during cycle.  LMP 07-25-20 reports that she normally bleeds 5-7 days, changes pad 5-6 times per day. Cycle In October lasted for 2 weeks. Pt reports that she had an U/S that showed a cyst.

## 2020-08-17 NOTE — Progress Notes (Signed)
   Subjective:    Patient ID: [facility], female    DOB: 02-24-1971, 49 y.o.   MRN: [id number]  HPI 49 yo G3P3 , SVD x 3, seen at [facility] office with 1 1/2 year history of menorrhagia.  She has regular menses that last for 7 days; however, last month the menses lasted 14 days.  Usually menses are the heaviest on days 4-5.  The patient does pass clots, but denies any cramping.  She denies fainting or passing out.  Of note patient has several chronic medical problems including obesity, chronic kidney disease,Type 2 diabetes, and hypertension.                                   Review of Systems  Constitutional: Negative.   HENT: Negative.   Respiratory: Negative.   Cardiovascular: Negative.   Genitourinary: Positive for menstrual problem.       Objective:   Physical Exam Vitals reviewed.  Constitutional:      Appearance: Normal appearance. She is obese. She is not ill-appearing.  HENT:     Head: Normocephalic and atraumatic.  Cardiovascular:     Rate and Rhythm: Normal rate and regular rhythm.     Heart sounds: Normal heart sounds.  Pulmonary:     Effort: Pulmonary effort is normal.     Breath sounds: Normal breath sounds.  Abdominal:     Palpations: There is no mass.     Tenderness: There is no abdominal tenderness.  Genitourinary:    Comments: External genitalia: grossly WNL Vagina WNL Cervix normal size and appearance, pap taken without incident small amount of light bleeding noted. Uterus: slightly enlarged, exam suboptimal due to body habitus Neurological:     Mental Status: She is alert.    Vitals:   08/17/20 0930  BP: 131/70  Pulse: 88         Assessment & Plan:   1. Menorrhagia with regular cycle Menorrhagia likely influenced by obesity.  Pt is not a good surgical candidate due to multiple comorbidities.  Current ultrasound and endometrial biopsy recommended before moving forward with planning.  If normal would consider mirena IUD versus uterine  abalation for initial treatment. - Cytology - PAP( [place]) - [facility] - LH - US PELVIC COMPLETE WITH TRANSVAGINAL; Future  2. Morbid obesity with BMI of 45.0-49.9, adult ([place])   3. Non-English speaking patient interpreter present  Endometrial biopsy after the Christmas holiday  Griffin Basil, MD Faculty Attending, [facility]

## 2020-08-17 NOTE — Patient Instructions (Signed)
Biopsia de endometrio Endometrial Biopsy  La biopsia de endometrio es un procedimiento en el que se toma una [place] de tejido del tero. La muestra se toma del endometrio, que es el revestimiento del tero. A continuacin, la muestra de tejido se revisa en el microscopio para ver si el tejido es normal o anormal. Este procedimiento ayuda a determinar si est en el ciclo menstrual y de qu modo los niveles de hormonas afectan el revestimiento del tero. Este procedimiento tambin se [place] para evaluar el sangrado uterino o para Electrical engineer de endometrio, tuberculosis endometrial, plipos u otras enfermedades inflamatorias. Informe al mdico acerca de lo siguiente:  Cualquier alergia que tenga.  Todos los [employer], incluidos vitaminas, hierbas, gotas oftlmicas, cremas y medicamentos de venta libre.  Cualquier problema previo que usted o los miembros de su familia hayan tenido con anestsicos.  Cualquier enfermedad de la sangre que tenga.  Cirugas previas a las que se someti.  Cualquier enfermedad que tenga.  Si est embarazada o podra estarlo. Cules son los riesgos? En general, se trata de un procedimiento seguro. Sin embargo, pueden ocurrir complicaciones, por ejemplo:  Hemorragia.  Infecciones plvicas.  Puncin en la pared del tero con el dispositivo utilizado para tomar la biopsia (raro). Qu ocurre antes del procedimiento?  Lleve un registro de sus ciclos menstruales segn se lo haya indicado su mdico. Puede ser necesario que programe el procedimiento para un momento especfico del ciclo menstrual.  Puede llevar un apsito sanitario para usar despus del procedimiento.  Consulte al mdico si debe hacer o no lo siguiente: ? Cambiar o suspender los medicamentos que toma habitualmente. Esto es muy importante si toma medicamentos para la diabetes o anticoagulantes. ? Tomar medicamentos como aspirina e ibuprofeno. Estos medicamentos pueden tener un  efecto anticoagulante en la [place]. No tome estos medicamentos antes del procedimiento si su mdico le indica que no lo haga.  Haga planes para que una persona lo lleve a su casa desde el hospital o la clnica. Qu ocurre durante el procedimiento?  Para reducir el riesgo de infecciones: ? El equipo mdico se lavar o se desinfectar las manos.  Deber recostarse en una camilla con los pies y las piernas elevados, como en el examen plvico.  El mdico insertar un instrumento (espculo) en la vagina para observar el cuello del tero.  El cuello del tero ser desinfectado con una solucin antisptica.  Para adormecer el cuello del tero, le aplicarn un medicamento (anestsico local).  Se utilizar un frceps (tenculo) para Contractor cuello del tero firme.  Se insertar un instrumento delgado, similar a una varilla (sonda uterina) a travs del cuello del tero para determinar su longitud y TEFL teacher de donde se tomar la muestra para la biopsia.  A continuacin, se pasa un tubo delgado y flexible (catter) a travs del cuello del tero hasta el tero. El catter se Risk manager para Bar[place] de tejido del endometrio para la biopsia.  Se retirarn el catter y [place], y la [place] se enviar al laboratorio para ser examinada. Qu sucede despus del procedimiento?  Descansar en una sala de recuperacin hasta que est lista para volver a su casa.  Sentir clicos leves y tendr una pequea cantidad de sangrado vaginal. Esto es normal.  Es su responsabilidad retirar los [place] del procedimiento. Pregntele al mdico o a alguien del departamento donde se realice el procedimiento cundo estarn [employer]. Resumen  La biopsia de endometrio es un procedimiento en  el que se toma [place] de tejido del endometrio, que es el revestimiento del tero.  Este procedimiento puede ayudar a Retail buyer problemas del ciclo menstrual, sangrado anormal u otras  afecciones que afectan el endometrio.  Antes del procedimiento, lleve un registro de sus ciclos menstruales segn se lo haya indicado su mdico.  La muestra de tejido extrada se revisar en el microscopio para ver si el tejido es normal o anormal. Esta informacin no tiene Marine scientist el consejo del mdico. Asegrese de hacerle al mdico cualquier pregunta que tenga. Document Revised: 04/24/2017 Document Reviewed: 04/24/2017 Elsevier Patient Education  2020 [place] uterino anormal Abnormal Uterine Bleeding El sangrado uterino anormal sucede cuando el sangrado del tero es ms duradero o ms abundante que lo normal. Esto puede incluir lo siguiente:  Hemorragias entre los perodos [place].  Sangrado luego de [employer].  Sangrado ms abundante que lo normal.  Perodos que duran ms de lo normal.  Sangrado durante la menopausia. Esta afeccin puede ser causada por muchos problemas. Si tiene un sangrado que no considera normal, debe consultar al [employer]. El tratamiento depende de la causa del sangrado. Siga estas indicaciones en su casa:  Controle su afeccin para detectar cualquier cambio.  No use tampones, no se haga duchas vaginales ni mantenga relaciones sexuales si su mdico le dice que no lo haga.  Cambie los apsitos con frecuencia.  Realcese los exmenes de rutina para mujeres. Asegrese de hacerse un examen plvico y de control de cncer de cuello uterino.  Concurra a todas las visitas de control como se lo haya indicado el mdico. Esto es importante. Comunquese con un mdico si:  El sangrado dura ms de una semana.  Se siente mareada por momentos.  Siente que va a vomitar (nuseas).  Vomita. Solicite ayuda de inmediato si:  Se desmaya.  Debe cambiarse los apsitos cada hora.  Siente dolor de estmago (abdominal).  Tiene fiebre.  Philbert Riser.  Se debilita.  Elimina cogulos de sangre grandes por la  vagina. Resumen  El sangrado uterino anormal sucede cuando el sangrado del tero es ms duradero o ms abundante que lo normal.  Esta afeccin puede ser causada por muchos problemas. Si tiene un sangrado que no considera normal, debe consultar al [employer].  El tratamiento depende de la causa del sangrado. Esta informacin no tiene Marine scientist el consejo del mdico. Asegrese de hacerle al mdico cualquier pregunta que tenga. Document Revised: 07/04/2017 Document Reviewed: 02/21/2017 Elsevier Patient Education  [place].

## 2020-08-18 ENCOUNTER — Other Ambulatory Visit: Payer: Self-pay | Admitting: Pulmonary Disease

## 2020-08-18 LAB — CYTOLOGY - PAP
Comment: NEGATIVE
Diagnosis: NEGATIVE
High risk HPV: NEGATIVE

## 2020-08-18 LAB — FOLLICLE STIMULATING HORMONE: FSH: 1.2 m[IU]/mL

## 2020-08-18 LAB — LUTEINIZING HORMONE: LH: 6.6 m[IU]/mL

## 2020-08-18 MED ORDER — ALBUTEROL SULFATE HFA 108 (90 BASE) MCG/ACT IN AERS: 2.0000 | INHALATION_SPRAY | Freq: Four times a day (QID) | RESPIRATORY_TRACT | 6 refills | Status: DC | PRN

## 2020-08-31 ENCOUNTER — Ambulatory Visit (HOSPITAL_COMMUNITY): Payer: Medicaid Other | Attending: Obstetrics and Gynecology

## 2020-09-21 ENCOUNTER — Inpatient Hospital Stay: Payer: Medicaid Other

## 2020-09-21 ENCOUNTER — Encounter: Payer: Self-pay | Admitting: Hematology and Oncology

## 2020-09-21 ENCOUNTER — Inpatient Hospital Stay: Payer: Medicaid Other | Attending: Hematology and Oncology | Admitting: Hematology and Oncology

## 2020-09-29 ENCOUNTER — Other Ambulatory Visit: Payer: Medicaid Other

## 2020-09-30 ENCOUNTER — Other Ambulatory Visit: Payer: Medicaid Other | Admitting: Obstetrics and Gynecology

## 2020-09-30 ENCOUNTER — Other Ambulatory Visit: Payer: Self-pay

## 2020-09-30 ENCOUNTER — Ambulatory Visit
Admission: RE | Admit: 2020-09-30 | Discharge: 2020-09-30 | Disposition: A | Discharge status: Home / Self Care | Payer: Medicaid Other | Source: Ambulatory Visit | Attending: Nurse Practitioner | Admitting: Nurse Practitioner

## 2020-09-30 DIAGNOSIS — N6310 Unspecified lump in the right breast, unspecified quadrant: Secondary | ICD-10-CM

## 2020-09-30 DIAGNOSIS — N6320 Unspecified lump in the left breast, unspecified quadrant: Secondary | ICD-10-CM

## 2020-10-16 ENCOUNTER — Telehealth: Payer: Self-pay | Admitting: Hematology and Oncology

## 2020-10-16 NOTE — Telephone Encounter (Signed)
Scheduled appt per 2/3 sch msg- pt is aware of new appt date and time

## 2020-10-20 ENCOUNTER — Other Ambulatory Visit: Payer: Self-pay

## 2020-10-20 ENCOUNTER — Inpatient Hospital Stay (HOSPITAL_BASED_OUTPATIENT_CLINIC_OR_DEPARTMENT_OTHER): Payer: Medicaid Other | Admitting: Hematology and Oncology

## 2020-10-20 ENCOUNTER — Encounter: Payer: Self-pay | Admitting: Hematology and Oncology

## 2020-10-20 ENCOUNTER — Other Ambulatory Visit: Payer: Self-pay | Admitting: Hematology and Oncology

## 2020-10-20 ENCOUNTER — Inpatient Hospital Stay: Payer: Medicaid Other

## 2020-10-20 ENCOUNTER — Inpatient Hospital Stay: Payer: Medicaid Other | Attending: Hematology and Oncology

## 2020-10-20 DIAGNOSIS — D509 Iron deficiency anemia, unspecified: Secondary | ICD-10-CM | POA: Diagnosis present

## 2020-10-20 DIAGNOSIS — J449 Chronic obstructive pulmonary disease, unspecified: Secondary | ICD-10-CM | POA: Insufficient documentation

## 2020-10-20 DIAGNOSIS — Z7951 Long term (current) use of inhaled steroids: Secondary | ICD-10-CM | POA: Diagnosis not present

## 2020-10-20 DIAGNOSIS — Z79899 Other long term (current) drug therapy: Secondary | ICD-10-CM | POA: Diagnosis not present

## 2020-10-20 DIAGNOSIS — N926 Irregular menstruation, unspecified: Secondary | ICD-10-CM | POA: Insufficient documentation

## 2020-10-20 DIAGNOSIS — M1990 Unspecified osteoarthritis, unspecified site: Secondary | ICD-10-CM | POA: Diagnosis not present

## 2020-10-20 DIAGNOSIS — Z7952 Long term (current) use of systemic steroids: Secondary | ICD-10-CM | POA: Insufficient documentation

## 2020-10-20 DIAGNOSIS — D500 Iron deficiency anemia secondary to blood loss (chronic): Secondary | ICD-10-CM | POA: Diagnosis not present

## 2020-10-20 DIAGNOSIS — Z7982 Long term (current) use of aspirin: Secondary | ICD-10-CM | POA: Insufficient documentation

## 2020-10-20 DIAGNOSIS — R946 Abnormal results of thyroid function studies: Secondary | ICD-10-CM | POA: Diagnosis not present

## 2020-10-20 LAB — CBC WITH DIFFERENTIAL/PLATELET
Abs Immature Granulocytes: 0.04 10*3/uL (ref 0.00–0.07)
Basophils Absolute: 0.1 10*3/uL (ref 0.0–0.1)
Basophils Relative: 1 %
Eosinophils Absolute: 0.4 10*3/uL (ref 0.0–0.5)
Eosinophils Relative: 4 %
HCT: 38.8 % (ref 36.0–46.0)
Hemoglobin: 12.8 g/dL (ref 12.0–15.0)
Immature Granulocytes: 0 %
Lymphocytes Relative: 36 %
Lymphs Abs: 4 10*3/uL (ref 0.7–4.0)
MCH: 29 pg (ref 26.0–34.0)
MCHC: 33 g/dL (ref 30.0–36.0)
MCV: 88 fL (ref 80.0–100.0)
Monocytes Absolute: 0.7 10*3/uL (ref 0.1–1.0)
Monocytes Relative: 6 %
Neutro Abs: 5.8 10*3/uL (ref 1.7–7.7)
Neutrophils Relative %: 53 %
Platelets: 345 10*3/uL (ref 150–400)
RBC: 4.41 MIL/uL (ref 3.87–5.11)
RDW: 13.4 % (ref 11.5–15.5)
WBC: 11 10*3/uL — ABNORMAL HIGH (ref 4.0–10.5)
nRBC: 0 % (ref 0.0–0.2)

## 2020-10-20 NOTE — Assessment & Plan Note (Signed)
Her anemia has resolved The patient stated she has irregular menstruation and no longer have menorrhagia She has multiple follow-up with various different physicians I recommend close follow-up with her rheumatologist as the patient is on methotrexate and that has to be monitored closely She will call me if needed in the future if she has recurrent anemia again

## 2020-10-20 NOTE — Assessment & Plan Note (Signed)
She is taking an extensive list of medications She is prescribed methotrexate with folic acid long-term by her rheumatologist at [facility] for inflammatory arthritis She has not seen him for over 6 months I recommend minimum 3 to 4 months visit while on methotrexate if possible because of high risk of toxicity without monitoring

## 2020-10-20 NOTE — Progress Notes (Signed)
[facility] OFFICE PROGRESS NOTE  Raven Huh, NP  ASSESSMENT & PLAN:  Iron deficiency anemia Her anemia has resolved The patient stated she has irregular menstruation and no longer have menorrhagia She has multiple follow-up with various different physicians I recommend close follow-up with her rheumatologist as the patient is on methotrexate and that has to be monitored closely She will call me if needed in the future if she has recurrent anemia again  Chronic inflammatory arthritis She is taking an extensive list of medications She is prescribed methotrexate with folic acid long-term by her rheumatologist at [facility] for inflammatory arthritis She has not seen him for over 6 months I recommend minimum 3 to 4 months visit while on methotrexate if possible because of high risk of toxicity without monitoring   No orders of the defined types were placed in this encounter.   The total time spent in the appointment was 20 minutes encounter with patients including review of chart and various tests results, discussions about plan of care and coordination of care plan   All questions were answered. The patient knows to call the clinic with any problems, questions or concerns. No barriers to learning was detected.    Heath Lark, MD 2/8/20223:06 PM  INTERVAL HISTORY: Raven Turner 50 y.o. female returns for further follow-up She missed her appointment recently due to language barrier and communication issues A Spanish interpreter is present throughout I reviewed her extensive list of medications She received multiple doses of IV iron in of last year Since last time I saw her, she has less menorrhagia In fact, she had missed menstrual cycle for 2 months She told me she was diagnosed with abnormal thyroid function and since she started taking her thyroid medication, she felt better On her medication list, I noted she is taking methotrexate, prescribed by her  rheumatologist for inflammatory arthritis She has not been seen for some time The patient denies any recent signs or symptoms of bleeding such as spontaneous epistaxis, hematuria or hematochezia.   SUMMARY OF HEMATOLOGIC HISTORY: Raven Turner  Was transferred to my care after her prior physician has left.  Interpreter is present throughout the entire visit I reviewed the patient's records extensive and collaborated the history with the patient. Summary of her history is as follows: This patient has symptomatic frequent multiple medical comorbidities She is steroid-dependent, prescribed for chronic COPD/asthma management She has significant weight gain and is currently using insulin therapy She was seen here repeatedly by different hematologist for iron deficiency anemia secondary to menorrhagia She has received intermittent intravenous iron infusion chronically She complains of intermittent fatigue She has chronic menorrhagia, with menstruation, lasting 6 to 8 days of her cycle of every 30 days, described as heavy She tolerated intravenous iron well She did not tolerate oral iron supplement causing constipation and GI distress The patient denies any recent signs or symptoms of bleeding such as spontaneous epistaxis, hematuria or hematochezia.    I have reviewed the past medical history, past surgical history, social history and family history with the patient and they are unchanged from previous note.  ALLERGIES:  is allergic to hydrocortisone, shellfish allergy, and sulfamethoxazole-trimethoprim.  MEDICATIONS:  Current Outpatient Medications  Medication Sig Dispense Refill  . albuterol (VENTOLIN HFA) 108 (90 Base) MCG/ACT inhaler Inhale 2 puffs into the lungs every 4 (four) hours as needed for wheezing or shortness of breath (((PLAN B))). 18 g 6  . albuterol (VENTOLIN HFA) 108 (90 Base) MCG/ACT  inhaler Inhale 2 puffs into the lungs every 6 (six) hours as needed for wheezing  or shortness of breath. 8 g 6  . aspirin EC 81 MG tablet Take 1 tablet (81 mg total) by mouth daily. 90 tablet 3  . BD INSULIN SYRINGE U-500 31G X 6MM 0.5 ML MISC U UTD TID SUBCUTANEOUSLY    . Blood Glucose Monitoring Suppl (ACCU-CHEK AVIVA PLUS) W/DEVICE KIT Used as directed 1 kit 0  . Buprenorphine HCl (BELBUCA) 450 MCG FILM Place inside cheek.    . cabergoline (DOSTINEX) 0.5 MG tablet Take 0.5 tablets (0.25 mg total) by mouth 2 (two) times a week. 10 tablet 3  . calcium carbonate (CALCIUM 600) 600 MG TABS tablet Take 1 tablet by mouth daily.    . cholecalciferol (VITAMIN D) 1000 UNITS tablet Take 1,000 Units by mouth every morning.    . cloNIDine (CATAPRES) 0.2 MG tablet Take 1 tablet (0.2 mg total) by mouth 2 (two) times daily. 180 tablet 3  . dexlansoprazole (DEXILANT) 60 MG capsule Take 60 mg by mouth daily.    . DULERA 200-5 MCG/ACT AERO Inhale 2 puffs by mouth twice daily 13 g 5  . Dupilumab (DUPIXENT) 300 MG/2ML SOPN Inject into the skin.    . folic acid (FOLVITE) 465 MCG tablet Take 400 mcg by mouth daily.    . furosemide (LASIX) 20 MG tablet Take 1 tablet (20 mg total) by mouth every morning. 30 tablet 2  . gabapentin (NEURONTIN) 400 MG capsule Take 2 capsules (800 mg total) by mouth 3 (three) times daily. 180 capsule 3  . glucose blood (RELION GLUCOSE TEST STRIPS) test strip 1 each by Other route 4 (four) times daily. ICD code E11.65 400 each 3  . HUMULIN R U-500 KWIKPEN 500 UNIT/ML kwikpen Inject 120-180 Units into the skin 3 (three) times daily. 180 U before breakfast, 160 U before lunch, 140 U before dinner (Patient taking differently: Inject 120-180 Units into the skin 3 (three) times daily. 130 U before breakfast, 110 U before lunch, 90 U before dinner) 27 mL 11  . hydrOXYzine (ATARAX/VISTARIL) 25 MG tablet Take 25 mg by mouth at bedtime as needed. (Patient not taking: Reported on 08/17/2020)    . Lancet Devices (ACCU-CHEK SOFTCLIX) lancets Use as instructed 1 each 0  .  levothyroxine (SYNTHROID, LEVOTHROID) 75 MCG tablet Take 112 mcg by mouth daily before breakfast.     . linaclotide (LINZESS) 145 MCG CAPS capsule Take 145 mcg by mouth daily before breakfast.    . losartan (COZAAR) 100 MG tablet Take 1 tablet (100 mg total) by mouth every morning. 30 tablet 6  . methocarbamol (ROBAXIN) 750 MG tablet Take 1 tablet (750 mg total) by mouth 3 (three) times daily. MUST MAKE APPT FOR FURTHER REFILLS 60 tablet 0  . methotrexate (RHEUMATREX) 2.5 MG tablet Take 2.5 mg by mouth once a week. 6 pills wkly .Caution:Chemotherapy. Protect from light.    . Misc. Devices (CANE) MISC 1 each by Does not apply route daily. 1 each 0  . montelukast (SINGULAIR) 10 MG tablet Take 1 tablet (10 mg total) by mouth at bedtime. 30 tablet 6  . nitroGLYCERIN (NITROSTAT) 0.4 MG SL tablet Place 1 tablet (0.4 mg total) under the tongue every 5 (five) minutes as needed. 25 tablet 3  . PARoxetine (PAXIL) 20 MG tablet Take 1 tablet (20 mg total) by mouth daily. MUST MAKE APPT FOR FURTHER REFILLS 30 tablet 1  . rosuvastatin (CRESTOR) 20 MG tablet Take 1  tablet (20 mg total) by mouth daily. 90 tablet 3  . spironolactone (ALDACTONE) 25 MG tablet Take 25 mg by mouth daily.    . Tiotropium Bromide Monohydrate (SPIRIVA RESPIMAT) 1.25 MCG/ACT AERS Inhale 2 puffs into the lungs daily. 4 g 3  . traMADol (ULTRAM) 50 MG tablet Take by mouth every 6 (six) hours as needed.    . traZODone (DESYREL) 50 MG tablet Take 50 mg by mouth at bedtime.    . verapamil (CALAN-SR) 180 MG CR tablet Take 2 tablets (360 mg total) by mouth at bedtime. 180 tablet 2   Current Facility-Administered Medications  Medication Dose Route Frequency Provider Last Rate Last Admin  . betamethasone acetate-betamethasone sodium phosphate (CELESTONE) injection 3 mg  3 mg Intramuscular Once Edrick Kins, DPM       Facility-Administered Medications Ordered in Other Visits  Medication Dose Route Frequency Provider Last Rate Last Admin  .  DOBUTamine (DOBUTREX) 1,000 mcg/mL in dextrose 5% 250 mL infusion  30 mcg/kg/min Intravenous Continuous Croitoru, Mihai, MD 145.3 mL/hr at 04/22/15 1300 30 mcg/kg/min at 04/22/15 1300     REVIEW OF SYSTEMS:   Constitutional: Denies fevers, chills or night sweats Eyes: Denies blurriness of vision Ears, nose, mouth, throat, and face: Denies mucositis or sore throat Respiratory: Denies cough, dyspnea or wheezes Cardiovascular: Denies palpitation, chest discomfort or lower extremity swelling Gastrointestinal:  Denies nausea, heartburn or change in bowel habits Skin: Denies abnormal skin rashes Lymphatics: Denies new lymphadenopathy or easy bruising Neurological:Denies numbness, tingling or new weaknesses Behavioral/Psych: Mood is stable, no new changes  All other systems were reviewed with the patient and are negative.  PHYSICAL EXAMINATION: ECOG PERFORMANCE STATUS: 1 - Symptomatic but completely ambulatory  Vitals:   10/20/20 0827  BP: 119/61  Pulse: 93  Resp: 18  Temp: 99.5 F (37.5 C)  SpO2: 98%   Filed Weights   10/20/20 0827  Weight: 185 lb 12.8 oz (84.3 kg)    GENERAL:alert, no distress and comfortable NEURO: alert & oriented x 3 with fluent speech, no focal motor/sensory deficits  LABORATORY DATA:  I have reviewed the data as listed     Component Value Date/Time   NA 139 04/06/2020 1410   K 4.4 04/06/2020 1410   CL 101 04/06/2020 1410   CO2 25 04/06/2020 1410   GLUCOSE 107 (H) 04/06/2020 1410   GLUCOSE 129 (H) 12/11/2019 1955   BUN 12 04/06/2020 1410   CREATININE 0.81 04/06/2020 1410   CREATININE 1.09 (H) 07/23/2018 1026   CREATININE 0.84 11/07/2016 1641   CALCIUM 9.2 04/06/2020 1410   PROT 7.1 04/06/2020 1410   ALBUMIN 4.2 04/06/2020 1410   AST 18 04/06/2020 1410   ALT 21 04/06/2020 1410   ALKPHOS 96 04/06/2020 1410   BILITOT <0.2 04/06/2020 1410   GFRNONAA 87 04/06/2020 1410   GFRNONAA 60 (L) 07/23/2018 1026   GFRNONAA 85 11/07/2016 1641   GFRAA 100  04/06/2020 1410   GFRAA >60 07/23/2018 1026   GFRAA >89 11/07/2016 1641    No results found for: SPEP, UPEP  Lab Results  Component Value Date   WBC 11.0 (H) 10/20/2020   NEUTROABS 5.8 10/20/2020   HGB 12.8 10/20/2020   HCT 38.8 10/20/2020   MCV 88.0 10/20/2020   PLT 345 10/20/2020      Chemistry      Component Value Date/Time   NA 139 04/06/2020 1410   K 4.4 04/06/2020 1410   CL 101 04/06/2020 1410   CO2 25 04/06/2020 1410  BUN 12 04/06/2020 1410   CREATININE 0.81 04/06/2020 1410   CREATININE 1.09 (H) 07/23/2018 1026   CREATININE 0.84 11/07/2016 1641      Component Value Date/Time   CALCIUM 9.2 04/06/2020 1410   ALKPHOS 96 04/06/2020 1410   AST 18 04/06/2020 1410   ALT 21 04/06/2020 1410   BILITOT <0.2 04/06/2020 1410

## 2020-11-02 ENCOUNTER — Other Ambulatory Visit: Payer: Self-pay

## 2020-11-02 ENCOUNTER — Ambulatory Visit (INDEPENDENT_AMBULATORY_CARE_PROVIDER_SITE_OTHER): Payer: Medicaid Other | Admitting: Podiatry

## 2020-11-02 DIAGNOSIS — M79675 Pain in left toe(s): Secondary | ICD-10-CM

## 2020-11-02 DIAGNOSIS — B351 Tinea unguium: Secondary | ICD-10-CM | POA: Diagnosis not present

## 2020-11-02 DIAGNOSIS — M79674 Pain in right toe(s): Secondary | ICD-10-CM | POA: Diagnosis not present

## 2020-11-02 MED ORDER — TERBINAFINE HCL 250 MG PO TABS: 250.0000 mg | ORAL_TABLET | Freq: Every day | ORAL | 0 refills | Status: DC

## 2020-11-02 NOTE — Progress Notes (Signed)
   Subjective: 50 y.o. female presenting today with a Spanish interpreter present for new complaint regarding discoloration and thickening to the bilateral great toenails.  Patient is concerned for possible toenail fungus.  She has been applying topical antifungal with no improvement.  This is been ongoing for several months now.  She presents for further treatment and evaluation  Past Medical History:  Diagnosis Date  . Anemia   . Asthma   . Bilateral hand numbness 06/25/2015  . Bone pain 02/27/2015   Diffuse bone pain in legs mostly but also in arms    . Breast pain, left 03/27/2015  . Callus of heel 12/16/2014  . Compression fracture   . Compression fracture of L1 lumbar vertebra (HCC) 08/26/2014  . Compression fracture of T12 vertebra ([place]) 08/28/2014  . COPD (chronic obstructive pulmonary disease) ([place]) 2013  . Cushing syndrome ([place]) 2013   . Depression   . Diabetes ([place]) 2013   . Diabetes mellitus type 2, uncontrolled ([place]) 08/26/2014   Sees Dr. Katy Fitch.  Last visit 11/27/14.  No diabetic retinopathy    . Diabetes mellitus with neuropathy ([place])   . Diastolic dysfunction without heart failure 10/14/2014   Grade 2 diastolic dysfunction 2/2 pulm HTN   . Generalized anxiety disorder 03/27/2015  . HTN (hypertension) 2005   . Hx of cataract surgery 11/03/2014  . LUQ abdominal pain 07/17/2015  . Medication management 05/12/2015  . Morbid obesity ([place]) 09/08/2014  . 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 Non-English speaking patient 09/08/2014  . OSA (obstructive sleep apnea) 05/20/2015  . Osteopenia 11/19/2013   Dx with osteopenia in lumbar vertebra with partial compression of L1  . Pain due to onychomycosis of toenail 03/27/2015  . Secondary Cushing's syndrome/ iatrogenic   . Skin rash 07/17/2015  . Tachycardia 01/13/2015  . Upper airway cough syndrome 09/05/2014   Followed in Pulmonary clinic/ [facility]/ [place]  - Trial off advair      09/05/2014 >>>  - trial off theoph 09/17/2014 and added  flutter  - rec reduce dulera to 100 2 bid 07/13/2015     . Vision changes 06/25/2015  . Vitamin D deficiency 08/28/2014    Objective: Physical Exam General: The patient is alert and oriented x3 in no acute distress.  Dermatology: Hyperkeratotic, discolored, thickened, onychodystrophy noted bilateral toenail plates. Skin is warm, dry and supple bilateral lower extremities. Negative for open lesions or macerations.  Vascular: Palpable pedal pulses bilaterally. No edema or erythema noted. Capillary refill within normal limits.  Neurological: Epicritic and protective threshold grossly intact bilaterally.   Musculoskeletal Exam: Range of motion within normal limits to all pedal and ankle joints bilateral. Muscle strength 5/5 in all groups bilateral.   Assessment: #1 Onychomycosis of toenails bilateral  Plan of Care:  #1 Patient was evaluated. #2  Patient states that she has been diagnosed with fatty liver however liver function panel 2 weeks ago was WNL. #3 prescription for Lamisil 250 mg #90 daily #4 return to clinic as needed   Edrick Kins, DPM [facility]  Dr. Edrick Kins, DPM    [address], [place] [address]                Office [phone]  Fax [phone]

## 2021-01-07 ENCOUNTER — Telehealth: Payer: Self-pay

## 2021-01-07 NOTE — Telephone Encounter (Signed)
Submitted a Prior Authorization request to [facility] for [place] via CoverMyMeds. Will update once we receive a response.   Key: [id number]

## 2021-01-08 NOTE — Telephone Encounter (Signed)
Received notification from [facility] regarding a prior authorization for [place]. Authorization has been APPROVED for the 2 loading dose pens (28 day supply).  Key: [id number] Auth# [id number]

## 2021-02-01 ENCOUNTER — Ambulatory Visit: Payer: Medicaid Other | Admitting: Podiatry

## 2021-03-08 ENCOUNTER — Telehealth: Payer: Self-pay | Admitting: Pulmonary Disease

## 2021-03-08 ENCOUNTER — Other Ambulatory Visit: Payer: Self-pay | Admitting: Pharmacist

## 2021-03-08 DIAGNOSIS — J45998 Other asthma: Secondary | ICD-10-CM

## 2021-03-08 DIAGNOSIS — J4550 Severe persistent asthma, uncomplicated: Secondary | ICD-10-CM

## 2021-03-08 MED ORDER — DUPIXENT 300 MG/2ML ~~LOC~~ SOSY: 300.0000 mg | PREFILLED_SYRINGE | SUBCUTANEOUS | 0 refills | Status: DC

## 2021-03-08 NOTE — Telephone Encounter (Signed)
Rx for Dupixent sent for 2 months only. Message sent to scheduling team to have pt scheduled for f/u visit with Dr. Loanne Drilling. Last seen Oct 2021

## 2021-03-08 NOTE — Telephone Encounter (Signed)
Received refill request from [place] for [place]. Patient has been consistently filling monthly.  Rx sent for 2 months only. Patient was last seen by Dr. Loanne Drilling on 07/10/20 with anticipated 3 mo f/u. Will route to scheduling team to assist in having patient scheduled for f/u  Knox Saliva, PharmD, MPH Clinical Pharmacist (Rheumatology and Pulmonology)

## 2021-04-23 ENCOUNTER — Ambulatory Visit (INDEPENDENT_AMBULATORY_CARE_PROVIDER_SITE_OTHER): Payer: Medicaid Other | Admitting: Pulmonary Disease

## 2021-04-23 ENCOUNTER — Other Ambulatory Visit: Payer: Self-pay

## 2021-04-23 DIAGNOSIS — J45998 Other asthma: Secondary | ICD-10-CM

## 2021-04-23 DIAGNOSIS — J4550 Severe persistent asthma, uncomplicated: Secondary | ICD-10-CM | POA: Diagnosis not present

## 2021-04-23 MED ORDER — MONTELUKAST SODIUM 10 MG PO TABS: 10.0000 mg | ORAL_TABLET | Freq: Every day | ORAL | 4 refills | Status: DC

## 2021-04-23 MED ORDER — ALBUTEROL SULFATE HFA 108 (90 BASE) MCG/ACT IN AERS: 2.0000 | INHALATION_SPRAY | RESPIRATORY_TRACT | 6 refills | Status: DC | PRN

## 2021-04-23 MED ORDER — SPIRIVA RESPIMAT 1.25 MCG/ACT IN AERS: 2.0000 | INHALATION_SPRAY | Freq: Every day | RESPIRATORY_TRACT | 3 refills | Status: DC

## 2021-04-23 MED ORDER — DULERA 200-5 MCG/ACT IN AERO: 2.0000 | INHALATION_SPRAY | Freq: Two times a day (BID) | RESPIRATORY_TRACT | 4 refills | Status: DC

## 2021-04-23 NOTE — Patient Instructions (Addendum)
Severe persistent asthma - well controlled REFILL Dulera 200-5 TWO puffs TWICE a day REFILL Spiriva 1.5 mcg TWO puffs ONCE a day REFILL/CONTINUE Albuterol as needed for shortness of breath or wheezing REFILL/CONTINUE Singulair ONCE a day CONTINUE Dupixent as scheduled  Asthma Action Plan Increase Dulera to 2 puffs three times a day for worsening shortness of breath, wheezing and cough. If you symptoms do not improve in 24-48 hours, please our office for evaluation and/or prednisone taper.  Follow-up with me in 6 months

## 2021-04-23 NOTE — Progress Notes (Signed)
Subjective:   PATIENT ID: Raven Turner GENDER: female DOB: [birth year], MRN: [id number]   HPI  Chief Complaint  Patient presents with   Follow-up    Sometime a little coughing. Inhalers working fine.Injection are working great.    Reason for Visit: Follow-up   Raven Turner is a 50 year-old Spanish speaking female never smoker who presents follow-up of severe persistent asthma. Translator present during interview  Synopsis: For management of her asthma she is on Dupixent and [place] and Spiriva. When she lived in [place], she stated she "lived" in the hospital. Last hospitalization in 2015. No further hospitalization since starting Dupixent. Weaned off of chronic steroids  04/23/21 Overall she feels she is doing well. She has more good days than bad days. Heat will worsen her shortness of breath. She has had to use her albuterol more frequently due to shortness of breath related to the weather three weeks ago but doing better this week, using albuterol only twice. Denies cough, wheezing or recent illness. Currently on home Dupixent and states she rotate sites.  Past Medical History:  Diagnosis Date   Anemia    Asthma    Bilateral hand numbness 06/25/2015   Bone pain 02/27/2015   Diffuse bone pain in legs mostly but also in arms     Breast pain, left 03/27/2015   Callus of heel 12/16/2014   Compression fracture    Compression fracture of L1 lumbar vertebra ([place]) 08/26/2014   Compression fracture of T12 vertebra ([place]) 08/28/2014   COPD (chronic obstructive pulmonary disease) ([place]) 2013   Cushing syndrome ([place]) 2013    Depression    Diabetes ([place]) 2013    Diabetes mellitus type 2, uncontrolled ([place]) 08/26/2014   Sees Dr. Katy Fitch.  Last visit 11/27/14.  No diabetic retinopathy     Diabetes mellitus with neuropathy (HCC)    Diastolic dysfunction without heart failure 10/14/2014   Grade 2 diastolic dysfunction 2/2 pulm HTN    Generalized anxiety disorder  03/27/2015   HTN (hypertension) 2005    Hx of cataract surgery 11/03/2014   LUQ abdominal pain 07/17/2015   Medication management 05/12/2015   Morbid obesity ([place]) 09/08/2014   MRSA colonization 12/15/2014   Non-English speaking patient 09/08/2014   OSA (obstructive sleep apnea) 05/20/2015   Osteopenia 11/19/2013   Dx with osteopenia in lumbar vertebra with partial compression of L1   Pain due to onychomycosis of toenail 03/27/2015   Secondary Cushing's syndrome/ iatrogenic    Skin rash 07/17/2015   Tachycardia 01/13/2015   Upper airway cough syndrome 09/05/2014   Followed in Pulmonary clinic/ [facility]/ [place]  - Trial off advair      09/05/2014 >>>  - trial off theoph 09/17/2014 and added flutter  - rec reduce dulera to 100 2 bid 07/13/2015      Vision changes 06/25/2015   Vitamin D deficiency 08/28/2014     Allergies  Allergen Reactions   Hydrocortisone Rash and Hives   Shellfish Allergy Anaphylaxis and Hives   Sulfamethoxazole-Trimethoprim Rash, Other (See Comments) and Itching    Facial redness with pruritus  Facial redness with pruritus      Outpatient Medications Prior to Visit  Medication Sig Dispense Refill   albuterol (VENTOLIN HFA) 108 (90 Base) MCG/ACT inhaler Inhale 2 puffs into the lungs every 4 (four) hours as needed for wheezing or shortness of breath (((PLAN B))). 18 g 6   albuterol (VENTOLIN HFA) 108 (90 Base) MCG/ACT inhaler Inhale 2 puffs  into the lungs every 6 (six) hours as needed for wheezing or shortness of breath. 8 g 6   aspirin EC 81 MG tablet Take 1 tablet (81 mg total) by mouth daily. 90 tablet 3   BD INSULIN SYRINGE U-500 31G X 6MM 0.5 ML MISC U UTD TID SUBCUTANEOUSLY     Blood Glucose Monitoring Suppl (ACCU-CHEK AVIVA PLUS) W/DEVICE KIT Used as directed 1 kit 0   Buprenorphine HCl (BELBUCA) 450 MCG FILM Place inside cheek.     cabergoline (DOSTINEX) 0.5 MG tablet Take 0.5 tablets (0.25 mg total) by mouth 2 (two) times a week. 10 tablet 3   calcium  carbonate (CALCIUM 600) 600 MG TABS tablet Take 1 tablet by mouth daily.     cholecalciferol (VITAMIN D) 1000 UNITS tablet Take 1,000 Units by mouth every morning.     cloNIDine (CATAPRES) 0.2 MG tablet Take 1 tablet (0.2 mg total) by mouth 2 (two) times daily. 180 tablet 3   dexlansoprazole (DEXILANT) 60 MG capsule Take 60 mg by mouth daily.     DULERA 200-5 MCG/ACT AERO Inhale 2 puffs by mouth twice daily 13 g 5   dupilumab (DUPIXENT) 300 MG/2ML prefilled syringe Inject 300 mg into the skin every 14 (fourteen) days. 4 mL 0   folic acid (FOLVITE) 295 MCG tablet Take 400 mcg by mouth daily.     furosemide (LASIX) 20 MG tablet Take 1 tablet (20 mg total) by mouth every morning. 30 tablet 2   gabapentin (NEURONTIN) 400 MG capsule Take 2 capsules (800 mg total) by mouth 3 (three) times daily. 180 capsule 3   glucose blood (RELION GLUCOSE TEST STRIPS) test strip 1 each by Other route 4 (four) times daily. ICD code E11.65 400 each 3   HUMULIN R U-500 KWIKPEN 500 UNIT/ML kwikpen Inject 120-180 Units into the skin 3 (three) times daily. 180 U before breakfast, 160 U before lunch, 140 U before dinner (Patient taking differently: Inject 120-180 Units into the skin 3 (three) times daily. 130 U before breakfast, 110 U before lunch, 90 U before dinner) 27 mL 11   hydrOXYzine (ATARAX/VISTARIL) 25 MG tablet Take 25 mg by mouth at bedtime as needed. (Patient not taking: Reported on 08/17/2020)     Lancet Devices (ACCU-CHEK SOFTCLIX) lancets Use as instructed 1 each 0   levothyroxine (SYNTHROID, LEVOTHROID) 75 MCG tablet Take 112 mcg by mouth daily before breakfast.      linaclotide (LINZESS) 145 MCG CAPS capsule Take 145 mcg by mouth daily before breakfast.     losartan (COZAAR) 100 MG tablet Take 1 tablet (100 mg total) by mouth every morning. 30 tablet 6   methocarbamol (ROBAXIN) 750 MG tablet Take 1 tablet (750 mg total) by mouth 3 (three) times daily. MUST MAKE APPT FOR FURTHER REFILLS 60 tablet 0    methotrexate (RHEUMATREX) 2.5 MG tablet Take 2.5 mg by mouth once a week. 6 pills wkly .Caution:Chemotherapy. Protect from light.     Misc. Devices (CANE) MISC 1 each by Does not apply route daily. 1 each 0   montelukast (SINGULAIR) 10 MG tablet Take 1 tablet (10 mg total) by mouth at bedtime. 30 tablet 6   nitroGLYCERIN (NITROSTAT) 0.4 MG SL tablet Place 1 tablet (0.4 mg total) under the tongue every 5 (five) minutes as needed. 25 tablet 3   PARoxetine (PAXIL) 20 MG tablet Take 1 tablet (20 mg total) by mouth daily. MUST MAKE APPT FOR FURTHER REFILLS 30 tablet 1   rosuvastatin (CRESTOR) 20  MG tablet Take 1 tablet (20 mg total) by mouth daily. 90 tablet 3   spironolactone (ALDACTONE) 25 MG tablet Take 25 mg by mouth daily.     terbinafine (LAMISIL) 250 MG tablet Take 1 tablet (250 mg total) by mouth daily. 90 tablet 0   Tiotropium Bromide Monohydrate (SPIRIVA RESPIMAT) 1.25 MCG/ACT AERS Inhale 2 puffs into the lungs daily. 4 g 3   traMADol (ULTRAM) 50 MG tablet Take by mouth every 6 (six) hours as needed.     traZODone (DESYREL) 50 MG tablet Take 50 mg by mouth at bedtime.     verapamil (CALAN-SR) 180 MG CR tablet Take 2 tablets (360 mg total) by mouth at bedtime. 180 tablet 2   Facility-Administered Medications Prior to Visit  Medication Dose Route Frequency Provider Last Rate Last Admin   betamethasone acetate-betamethasone sodium phosphate (CELESTONE) injection 3 mg  3 mg Intramuscular Once Daylene Katayama M, DPM       DOBUTamine (DOBUTREX) 1,000 mcg/mL in dextrose 5% 250 mL infusion  30 mcg/kg/min Intravenous Continuous Croitoru, Mihai, MD 145.3 mL/hr at 04/22/15 1300 30 mcg/kg/min at 04/22/15 1300    Review of Systems  Constitutional:  Negative for chills, diaphoresis, fever, malaise/fatigue and weight loss.  HENT:  Negative for congestion.   Respiratory:  Positive for cough and shortness of breath. Negative for hemoptysis, sputum production and wheezing.   Cardiovascular:  Negative for  chest pain, palpitations and leg swelling.    Objective:   Vitals:   04/23/21 1147  BP: 110/70  Pulse: 89  SpO2: 99%      Physical Exam: General: Well-appearing, no acute distress HENT: [place], AT Eyes: EOMI, no scleral icterus Respiratory: Clear to auscultation bilaterally.  No crackles, wheezing or rales Cardiovascular: RRR, -M/R/G, no JVD Extremities:-Edema,-tenderness Neuro: AAO x4, CNII-XII grossly intact Psych: Normal mood, normal affect  Data Reviewed:  Imaging: CT Chest 11/25/18 - Mild air trapping. Normal parenchyma  PFT: 09/02/19 FVC 1.99 (76%) FEV1 1.73 (81%) Ratio 81  TLC 103% DLCO 131%. No significant bronchodilator effect. Interpretation: No obstructive defect or restrictive defect seen on spirometry. Elevated DLCO can be seen in asthma, obesity and large lung volumes.  Labs: Abs eos 03/17/20 - 400      Assessment & Plan:   Discussion: 50 year old female with severe persistent asthma, previously on chronic prednisone. Well controlled on Dupixent  Severe persistent asthma - well controlled REFILL Dulera 200-5 TWO puffs TWICE a day REFILL Spiriva 1.5 mcg TWO puffs ONCE a day REFILL/CONTINUE Albuterol as needed for shortness of breath or wheezing REFILL/CONTINUE Singulair ONCE a day CONTINUE Dupixent as scheduled  Asthma Action Plan Increase Dulera to 2 puffs three times a day for worsening shortness of breath, wheezing and cough. If you symptoms do not improve in 24-48 hours, please our office for evaluation and/or prednisone taper.  Health Maintenance Immunization History  Administered Date(s) Administered   Hepatitis B 03/23/2015, 07/06/2015   Influenza Split 05/25/2015   Influenza,inj,Quad PF,6+ Mos 05/13/2016, 05/29/2017, 07/13/2018, 06/03/2019, 06/17/2020   Influenza-Unspecified 05/25/2015, 05/13/2016, 05/29/2017, 07/13/2018, 06/03/2019   PFIZER(Purple Top)SARS-COV-2 Vaccination 12/02/2019, 12/23/2019   Pneumococcal Polysaccharide-23 07/31/2017    Pneumococcal-Unspecified 04/12/2012   Tdap 06/24/2015    No orders of the defined types were placed in this encounter.  Meds ordered this encounter  Medications   mometasone-formoterol (DULERA) 200-5 MCG/ACT AERO    Sig: Inhale 2 puffs into the lungs 2 (two) times daily.    Dispense:  3 each    Refill:  4   albuterol (VENTOLIN HFA) 108 (90 Base) MCG/ACT inhaler    Sig: Inhale 2 puffs into the lungs every 4 (four) hours as needed for wheezing or shortness of breath (((PLAN B))).    Dispense:  18 g    Refill:  6   Tiotropium Bromide Monohydrate (SPIRIVA RESPIMAT) 1.25 MCG/ACT AERS    Sig: Inhale 2 puffs into the lungs daily.    Dispense:  4 g    Refill:  3   montelukast (SINGULAIR) 10 MG tablet    Sig: Take 1 tablet (10 mg total) by mouth at bedtime.    Dispense:  90 tablet    Refill:  4    Return in about 6 months (around 10/24/2021).  I have spent a total time of 36-minutes on the day of the appointment reviewing prior documentation, coordinating care and discussing medical diagnosis and plan with the patient/family. Past medical history, allergies, medications were reviewed. Pertinent imaging, labs and tests included in this note have been reviewed and interpreted independently by me.   [place], MD [facility] 04/23/2021 11:40 AM  Office Number [phone]

## 2021-05-13 ENCOUNTER — Other Ambulatory Visit: Payer: Self-pay | Admitting: *Deleted

## 2021-05-13 DIAGNOSIS — J4550 Severe persistent asthma, uncomplicated: Secondary | ICD-10-CM

## 2021-05-13 MED ORDER — DUPIXENT 300 MG/2ML ~~LOC~~ SOSY: 300.0000 mg | PREFILLED_SYRINGE | SUBCUTANEOUS | 0 refills | Status: DC

## 2021-05-23 ENCOUNTER — Encounter: Payer: Self-pay | Admitting: Pulmonary Disease

## 2021-05-27 ENCOUNTER — Telehealth: Payer: Self-pay | Admitting: Pulmonary Disease

## 2021-05-27 DIAGNOSIS — J4550 Severe persistent asthma, uncomplicated: Secondary | ICD-10-CM

## 2021-05-28 MED ORDER — DUPIXENT 300 MG/2ML ~~LOC~~ SOSY: 300.0000 mg | PREFILLED_SYRINGE | SUBCUTANEOUS | 1 refills | Status: DC

## 2021-05-28 NOTE — Telephone Encounter (Signed)
Refill for Dupixent 300 mg every 14 days PFS sent to [facility].  Last seen by Dr. Loanne Drilling on 8/12/222 with plan to continue [place] as prescribed  Knox Saliva, PharmD, MPH, BCPS Clinical Pharmacist (Rheumatology and Pulmonology)

## 2021-06-09 ENCOUNTER — Telehealth: Payer: Self-pay | Admitting: Hematology and Oncology

## 2021-06-09 NOTE — Telephone Encounter (Signed)
Scheduled per sch mg. Called with interpreter, left msg

## 2021-06-28 ENCOUNTER — Inpatient Hospital Stay: Payer: Medicaid Other

## 2021-06-28 ENCOUNTER — Inpatient Hospital Stay: Payer: Medicaid Other | Attending: Hematology and Oncology | Admitting: Hematology and Oncology

## 2021-06-28 ENCOUNTER — Other Ambulatory Visit: Payer: Self-pay

## 2021-06-28 ENCOUNTER — Encounter: Payer: Self-pay | Admitting: Hematology and Oncology

## 2021-06-28 VITALS — BP 130/65 | HR 82 | Temp 98.3°F | Resp 18 | Ht <= 58 in | Wt 179.0 lb

## 2021-06-28 VITALS — BP 101/55 | HR 72 | Temp 98.3°F | Resp 16 | Ht <= 58 in | Wt 178.8 lb

## 2021-06-28 DIAGNOSIS — Z79899 Other long term (current) drug therapy: Secondary | ICD-10-CM | POA: Insufficient documentation

## 2021-06-28 DIAGNOSIS — D508 Other iron deficiency anemias: Secondary | ICD-10-CM | POA: Insufficient documentation

## 2021-06-28 DIAGNOSIS — N920 Excessive and frequent menstruation with regular cycle: Secondary | ICD-10-CM | POA: Diagnosis not present

## 2021-06-28 DIAGNOSIS — D500 Iron deficiency anemia secondary to blood loss (chronic): Secondary | ICD-10-CM

## 2021-06-28 DIAGNOSIS — D509 Iron deficiency anemia, unspecified: Secondary | ICD-10-CM

## 2021-06-28 DIAGNOSIS — Z7982 Long term (current) use of aspirin: Secondary | ICD-10-CM | POA: Insufficient documentation

## 2021-06-28 DIAGNOSIS — K909 Intestinal malabsorption, unspecified: Secondary | ICD-10-CM | POA: Diagnosis present

## 2021-06-28 LAB — CBC WITH DIFFERENTIAL/PLATELET
Abs Immature Granulocytes: 0.03 10*3/uL (ref 0.00–0.07)
Basophils Absolute: 0.1 10*3/uL (ref 0.0–0.1)
Basophils Relative: 1 %
Eosinophils Absolute: 0.3 10*3/uL (ref 0.0–0.5)
Eosinophils Relative: 4 %
HCT: 34.6 % — ABNORMAL LOW (ref 36.0–46.0)
Hemoglobin: 11.7 g/dL — ABNORMAL LOW (ref 12.0–15.0)
Immature Granulocytes: 0 %
Lymphocytes Relative: 42 %
Lymphs Abs: 3.4 10*3/uL (ref 0.7–4.0)
MCH: 28.6 pg (ref 26.0–34.0)
MCHC: 33.8 g/dL (ref 30.0–36.0)
MCV: 84.6 fL (ref 80.0–100.0)
Monocytes Absolute: 0.7 10*3/uL (ref 0.1–1.0)
Monocytes Relative: 9 %
Neutro Abs: 3.6 10*3/uL (ref 1.7–7.7)
Neutrophils Relative %: 44 %
Platelets: 345 10*3/uL (ref 150–400)
RBC: 4.09 MIL/uL (ref 3.87–5.11)
RDW: 13.6 % (ref 11.5–15.5)
WBC: 8.1 10*3/uL (ref 4.0–10.5)
nRBC: 0 % (ref 0.0–0.2)

## 2021-06-28 LAB — IRON AND TIBC
Iron: 44 ug/dL (ref 41–142)
Saturation Ratios: 13 % — ABNORMAL LOW (ref 21–57)
TIBC: 328 ug/dL (ref 236–444)
UIBC: 284 ug/dL (ref 120–384)

## 2021-06-28 LAB — FERRITIN: Ferritin: 20 ng/mL (ref 11–307)

## 2021-06-28 MED ORDER — SODIUM CHLORIDE 0.9 % IV SOLN
510.0000 mg | Freq: Once | INTRAVENOUS | Status: AC
  Administered 2021-06-28: 510 mg via INTRAVENOUS
  Filled 2021-06-28: qty 510

## 2021-06-28 MED ORDER — SODIUM CHLORIDE 0.9 % IV SOLN
Freq: Once | INTRAVENOUS | Status: AC
Start: 2021-06-28 — End: 2021-06-28

## 2021-06-28 NOTE — Assessment & Plan Note (Signed)
She is symptomatic from persistent iron deficiency anemia I suspect the cause of her iron deficiency is due to menorrhagia She felt better with intravenous iron infusion last year  The most likely cause of her anemia is due to chronic blood loss/malabsorption syndrome. We discussed some of the risks, benefits, and alternatives of intravenous iron infusions. The patient is symptomatic from anemia and the iron level is critically low. She tolerated oral iron supplement poorly and desires to achieved higher levels of iron faster for adequate hematopoesis. Some of the side-effects to be expected including risks of infusion reactions, phlebitis, headaches, nausea and fatigue.  The patient is willing to proceed. Patient education material was dispensed.  Goal is to keep ferritin level greater than 50 and resolution of anemia She will get 1 dose today and 1 dose next week I plan to see her again next year approximately 9 months away for repeat blood work and possibly further iron infusion in the future

## 2021-06-28 NOTE — Patient Instructions (Signed)
Ferumoxytol Injection Qu es este medicamento? El FERUMOXITOL es un complejo de hierro. El hierro se [place] para producir glbulos rojos saludables, que transportan oxgeno y nutrientes por el cuerpo. Este medicamento se [place] para tratar la anemia por deficiencia de hierro. Este medicamento puede ser utilizado para otros usos; si tiene alguna pregunta consulte con su proveedor de atencin mdica o con su farmacutico. MARCAS COMUNES: Feraheme Qu le debo informar a mi profesional de la salud antes de tomar este medicamento? Necesita saber si usted presenta alguno de los [employer] o situaciones: anemia no provocada por niveles bajos de hierro niveles altos de hierro en la sangre examen de imgenes por Health visitor (IRM) programado una reaccin alrgica o inusual al hierro, otros medicamentos, alimentos, colorantes o conservantes si est embarazada o buscando quedar embarazada si est amamantando a un beb Cmo debo Insurance account manager medicamento? Este medicamento se administra mediante inyeccin por va intravenosa. Lo administra un profesional de Technical sales engineer en un hospital o en un entorno clnico. Hable con su pediatra para informarse acerca del uso de este medicamento en nios. Puede requerir atencin especial. Sobredosis: Pngase en contacto inmediatamente con un centro toxicolgico o una sala de urgencia si usted cree que haya tomado demasiado medicamento. ATENCIN: [employer] es solo para usted. No comparta este medicamento con nadie. Qu sucede si me olvido de una dosis? Es importante no olvidar ninguna dosis. Informe a su mdico o a su profesional de la salud si no puede asistir a Photographer. Qu puede interactuar con este medicamento? Esta medicina puede interactuar con los siguientes medicamentos: otros productos de hierro Puede ser que esta lista no menciona todas las posibles interacciones. Informe a su profesional de [employer]	[place] de [employer] productos a base de hierbas,  medicamentos de [place] o suplementos nutritivos que est tomando. Si usted fuma, consume bebidas alcohlicas o si utiliza drogas ilegales, indqueselo tambin a su profesional de [employer]	[place]. Algunas sustancias pueden interactuar con su medicamento. A qu debo estar atento al usar [employer]? Visite a su mdico o a su profesional de la salud de [place] regular. Si los sntomas no comienzan a mejorar o si empeoran, consulte con su mdico o con su profesional de [employer]	[place]. Tal vez necesita realizarse anlisis de sangre mientras recibe [place]. Tal vez necesita seguir Counselling psychologist. Consulte a su mdico. Los alimentos que contienen hierro incluyen: alimentos integrales o con cereales, frutas secas, frijoles o arvejas, vegetales de hoja verde y carne que proviene de rganos (hgado, rin). Qu efectos secundarios puedo tener al [employer] este medicamento? Efectos secundarios que debe informar a su mdico o a Barrister's clerk de la salud tan pronto como sea posible: Chief of Staff, como erupcin cutnea, picazn o urticarias, e hinchazn de la cara, los labios o la lengua problemas respiratorios cambios en la presin sangunea sensacin de desmayos o aturdimiento, cadas fiebre o escalofros enrojecimiento, sudoracin o sensacin de calor hinchazn de tobillos o pies Efectos secundarios que generalmente no requieren atencin mdica (infrmelos a su mdico o a Barrister's clerk de la salud si persisten o si son molestos): diarrea dolor de cabeza nuseas, vmito dolor estomacal Puede ser que esta lista no menciona todos los posibles efectos secundarios. Comunquese a su mdico por asesoramiento mdico [employer]. Usted puede informar los efectos secundarios a la FDA por telfono al 1-800-FDA-1088. Dnde debo guardar mi medicina? Este medicamento se administra en hospitales o clnicas y no necesitar guardarlo en su domicilio. ATENCIN: Este folleto es  un resumen. Puede  ser que no cubra toda la posible informacin. Si usted tiene preguntas acerca de esta medicina, consulte con su mdico, su farmacutico o su profesional de Technical sales engineer.  2022 Elsevier/Gold Standard (2016-12-20 00:00:00)

## 2021-06-28 NOTE — Progress Notes (Signed)
[facility] OFFICE PROGRESS NOTE  Raven Huh, NP  ASSESSMENT & PLAN:  Iron deficiency anemia She is symptomatic from persistent iron deficiency anemia I suspect the cause of her iron deficiency is due to menorrhagia She felt better with intravenous iron infusion last year  The most likely cause of her anemia is due to chronic blood loss/malabsorption syndrome. We discussed some of the risks, benefits, and alternatives of intravenous iron infusions. The patient is symptomatic from anemia and the iron level is critically low. She tolerated oral iron supplement poorly and desires to achieved higher levels of iron faster for adequate hematopoesis. Some of the side-effects to be expected including risks of infusion reactions, phlebitis, headaches, nausea and fatigue.  The patient is willing to proceed. Patient education material was dispensed.  Goal is to keep ferritin level greater than 50 and resolution of anemia She will get 1 dose today and 1 dose next week I plan to see her again next year approximately 9 months away for repeat blood work and possibly further iron infusion in the future   Menorrhagia with regular cycle She has significant menorrhagia but with intermittent symptoms of hot flashes, it could be related to perimenopausal situation I would defer to her primary care doctor for further management  Orders Placed This Encounter  Procedures   CBC with Differential/Platelet    Standing Status:   Standing    Number of Occurrences:   22    Standing Expiration Date:   06/28/2022    The total time spent in the appointment was 20 minutes encounter with patients including review of chart and various tests results, discussions about plan of care and coordination of care plan   All questions were answered. The patient knows to call the clinic with any problems, questions or concerns. No barriers to learning was detected.    Heath Lark, MD 10/17/20222:07  PM  INTERVAL HISTORY: Raven Turner 50 y.o. female returns for recurrent iron deficiency anemia due to menorrhagia The patient has not been seen since a year ago She has been having significant menorrhagia She continues to have menstrual cycle every 30 days but her bleeding is heavy with passage of clots and last 7 to 8 days She complained of mild fatigue She has some perimenopausal symptoms No other forms of bleeding She denies recent pica  SUMMARY OF HEMATOLOGIC HISTORY: Raven Turner  Was transferred to my care after her prior physician has left.  Interpreter is present throughout the entire visit I reviewed the patient's records extensive and collaborated the history with the patient. Summary of her history is as follows: This patient has symptomatic frequent multiple medical comorbidities She is steroid-dependent, prescribed for chronic COPD/asthma management She has significant weight gain and is currently using insulin therapy She was seen here repeatedly by different hematologist for iron deficiency anemia secondary to menorrhagia She has received intermittent intravenous iron infusion chronically She complains of intermittent fatigue She has chronic menorrhagia, with menstruation, lasting 6 to 8 days of her cycle of every 30 days, described as heavy She tolerated intravenous iron well She did not tolerate oral iron supplement causing constipation and GI distress The patient denies any recent signs or symptoms of bleeding such as spontaneous epistaxis, hematuria or hematochezia. She received several doses of intravenous iron in 2021  I have reviewed the past medical history, past surgical history, social history and family history with the patient and they are unchanged from previous note.  ALLERGIES:  is  allergic to hydrocortisone, shellfish allergy, and sulfamethoxazole-trimethoprim.  MEDICATIONS:  Current Outpatient Medications  Medication Sig Dispense  Refill   insulin aspart (NOVOLOG) 100 UNIT/ML FlexPen Inject 12 Units into the skin 3 (three) times daily.     albuterol (VENTOLIN HFA) 108 (90 Base) MCG/ACT inhaler Inhale 2 puffs into the lungs every 6 (six) hours as needed for wheezing or shortness of breath. 8 g 6   albuterol (VENTOLIN HFA) 108 (90 Base) MCG/ACT inhaler Inhale 2 puffs into the lungs every 4 (four) hours as needed for wheezing or shortness of breath (((PLAN B))). 18 g 6   aspirin EC 81 MG tablet Take 1 tablet (81 mg total) by mouth daily. 90 tablet 3   BD INSULIN SYRINGE U-500 31G X 6MM 0.5 ML MISC U UTD TID SUBCUTANEOUSLY     Blood Glucose Monitoring Suppl (ACCU-CHEK AVIVA PLUS) W/DEVICE KIT Used as directed 1 kit 0   Buprenorphine HCl (BELBUCA) 450 MCG FILM Place inside cheek.     cabergoline (DOSTINEX) 0.5 MG tablet Take 0.5 tablets (0.25 mg total) by mouth 2 (two) times a week. 10 tablet 3   calcium carbonate (OS-CAL) 600 MG TABS tablet Take 1 tablet by mouth daily.     cholecalciferol (VITAMIN D) 1000 UNITS tablet Take 1,000 Units by mouth every morning.     cloNIDine (CATAPRES) 0.2 MG tablet Take 1 tablet (0.2 mg total) by mouth 2 (two) times daily. 180 tablet 3   dexlansoprazole (DEXILANT) 60 MG capsule Take 60 mg by mouth daily.     dupilumab (DUPIXENT) 300 MG/2ML prefilled syringe Inject 300 mg into the skin every 14 (fourteen) days. 12 mL 1   folic acid (FOLVITE) 341 MCG tablet Take 400 mcg by mouth daily.     furosemide (LASIX) 20 MG tablet Take 1 tablet (20 mg total) by mouth every morning. 30 tablet 2   gabapentin (NEURONTIN) 400 MG capsule Take 2 capsules (800 mg total) by mouth 3 (three) times daily. 180 capsule 3   glucose blood (RELION GLUCOSE TEST STRIPS) test strip 1 each by Other route 4 (four) times daily. ICD code E11.65 400 each 3   hydrOXYzine (ATARAX/VISTARIL) 25 MG tablet Take 25 mg by mouth at bedtime as needed.     Lancet Devices (ACCU-CHEK SOFTCLIX) lancets Use as instructed 1 each 0    levothyroxine (SYNTHROID, LEVOTHROID) 75 MCG tablet Take 112 mcg by mouth daily before breakfast.      linaclotide (LINZESS) 145 MCG CAPS capsule Take 145 mcg by mouth daily before breakfast.     losartan (COZAAR) 100 MG tablet Take 1 tablet (100 mg total) by mouth every morning. 30 tablet 6   methocarbamol (ROBAXIN) 750 MG tablet Take 1 tablet (750 mg total) by mouth 3 (three) times daily. MUST MAKE APPT FOR FURTHER REFILLS 60 tablet 0   methotrexate (RHEUMATREX) 2.5 MG tablet Take 2.5 mg by mouth once a week. 8 pills wkly .Caution:Chemotherapy. Protect from light.      Misc. Devices (CANE) MISC 1 each by Does not apply route daily. 1 each 0   mometasone-formoterol (DULERA) 200-5 MCG/ACT AERO Inhale 2 puffs into the lungs 2 (two) times daily. 3 each 4   montelukast (SINGULAIR) 10 MG tablet Take 1 tablet (10 mg total) by mouth at bedtime. 90 tablet 4   nitroGLYCERIN (NITROSTAT) 0.4 MG SL tablet Place 1 tablet (0.4 mg total) under the tongue every 5 (five) minutes as needed. 25 tablet 3   PARoxetine (PAXIL) 20 MG tablet  Take 1 tablet (20 mg total) by mouth daily. MUST MAKE APPT FOR FURTHER REFILLS 30 tablet 1   rosuvastatin (CRESTOR) 20 MG tablet Take 1 tablet (20 mg total) by mouth daily. 90 tablet 3   spironolactone (ALDACTONE) 25 MG tablet Take 25 mg by mouth daily.     terbinafine (LAMISIL) 250 MG tablet Take 1 tablet (250 mg total) by mouth daily. 90 tablet 0   Tiotropium Bromide Monohydrate (SPIRIVA RESPIMAT) 1.25 MCG/ACT AERS Inhale 2 puffs into the lungs daily. 4 g 3   traMADol (ULTRAM) 50 MG tablet Take by mouth every 6 (six) hours as needed.     traZODone (DESYREL) 50 MG tablet Take 50 mg by mouth at bedtime.     verapamil (CALAN-SR) 180 MG CR tablet Take 2 tablets (360 mg total) by mouth at bedtime. 180 tablet 2   Current Facility-Administered Medications  Medication Dose Route Frequency Provider Last Rate Last Admin   betamethasone acetate-betamethasone sodium phosphate (CELESTONE)  injection 3 mg  3 mg Intramuscular Once Edrick Kins, DPM       Facility-Administered Medications Ordered in Other Visits  Medication Dose Route Frequency Provider Last Rate Last Admin   DOBUTamine (DOBUTREX) 1,000 mcg/mL in dextrose 5% 250 mL infusion  30 mcg/kg/min Intravenous Continuous Croitoru, Mihai, MD 145.3 mL/hr at 04/22/15 1300 30 mcg/kg/min at 04/22/15 1300     REVIEW OF SYSTEMS:   Constitutional: Denies fevers, chills or night sweats Eyes: Denies blurriness of vision Ears, nose, mouth, throat, and face: Denies mucositis or sore throat Respiratory: Denies cough, dyspnea or wheezes Cardiovascular: Denies palpitation, chest discomfort or lower extremity swelling Gastrointestinal:  Denies nausea, heartburn or change in bowel habits Skin: Denies abnormal skin rashes Lymphatics: Denies new lymphadenopathy or easy bruising Neurological:Denies numbness, tingling or new weaknesses Behavioral/Psych: Mood is stable, no new changes  All other systems were reviewed with the patient and are negative.  PHYSICAL EXAMINATION: ECOG PERFORMANCE STATUS: 1 - Symptomatic but completely ambulatory  Vitals:   06/28/21 1151  BP: 130/65  Pulse: 82  Resp: 18  Temp: 98.3 F (36.8 C)  SpO2: 98%   Filed Weights   06/28/21 1151  Weight: 179 lb (81.2 kg)    GENERAL:alert, no distress and comfortable.  She looks somewhat questioning NEURO: alert & oriented x 3 with fluent speech, no focal motor/sensory deficits  LABORATORY DATA:  I have reviewed the data as listed     Component Value Date/Time   NA 139 04/06/2020 1410   K 4.4 04/06/2020 1410   CL 101 04/06/2020 1410   CO2 25 04/06/2020 1410   GLUCOSE 107 (H) 04/06/2020 1410   GLUCOSE 129 (H) 12/11/2019 1955   BUN 12 04/06/2020 1410   CREATININE 0.81 04/06/2020 1410   CREATININE 1.09 (H) 07/23/2018 1026   CREATININE 0.84 11/07/2016 1641   CALCIUM 9.2 04/06/2020 1410   PROT 7.1 04/06/2020 1410   ALBUMIN 4.2 04/06/2020 1410   AST  18 04/06/2020 1410   ALT 21 04/06/2020 1410   ALKPHOS 96 04/06/2020 1410   BILITOT <0.2 04/06/2020 1410   GFRNONAA 87 04/06/2020 1410   GFRNONAA 60 (L) 07/23/2018 1026   GFRNONAA 85 11/07/2016 1641   GFRAA 100 04/06/2020 1410   GFRAA >60 07/23/2018 1026   GFRAA >89 11/07/2016 1641    No results found for: SPEP, UPEP  Lab Results  Component Value Date   WBC 11.0 (H) 10/20/2020   NEUTROABS 5.8 10/20/2020   HGB 12.8 10/20/2020   HCT 38.8  10/20/2020   MCV 88.0 10/20/2020   PLT 345 10/20/2020      Chemistry      Component Value Date/Time   NA 139 04/06/2020 1410   K 4.4 04/06/2020 1410   CL 101 04/06/2020 1410   CO2 25 04/06/2020 1410   BUN 12 04/06/2020 1410   CREATININE 0.81 04/06/2020 1410   CREATININE 1.09 (H) 07/23/2018 1026   CREATININE 0.84 11/07/2016 1641      Component Value Date/Time   CALCIUM 9.2 04/06/2020 1410   ALKPHOS 96 04/06/2020 1410   AST 18 04/06/2020 1410   ALT 21 04/06/2020 1410   BILITOT <0.2 04/06/2020 1410

## 2021-06-28 NOTE — Assessment & Plan Note (Signed)
She has significant menorrhagia but with intermittent symptoms of hot flashes, it could be related to perimenopausal situation I would defer to her primary care doctor for further management

## 2021-07-05 ENCOUNTER — Other Ambulatory Visit: Payer: Self-pay

## 2021-07-05 ENCOUNTER — Other Ambulatory Visit: Payer: Self-pay | Admitting: Oncology

## 2021-07-05 ENCOUNTER — Inpatient Hospital Stay: Payer: Medicaid Other

## 2021-07-05 VITALS — BP 116/62 | HR 66 | Temp 97.7°F | Resp 18

## 2021-07-05 DIAGNOSIS — D508 Other iron deficiency anemias: Secondary | ICD-10-CM | POA: Diagnosis not present

## 2021-07-05 DIAGNOSIS — D509 Iron deficiency anemia, unspecified: Secondary | ICD-10-CM

## 2021-07-05 MED ORDER — SODIUM CHLORIDE 0.9 % IV SOLN
510.0000 mg | Freq: Once | INTRAVENOUS | Status: AC
  Administered 2021-07-05: 510 mg via INTRAVENOUS
  Filled 2021-07-05: qty 17

## 2021-07-05 MED ORDER — SODIUM CHLORIDE 0.9 % IV SOLN: Freq: Once | INTRAVENOUS | Status: AC

## 2021-07-05 NOTE — Patient Instructions (Signed)
Ferumoxytol Injection Qu es este medicamento? El FERUMOXITOL es un complejo de hierro. El hierro se [place] para producir glbulos rojos saludables, que transportan oxgeno y nutrientes por el cuerpo. Este medicamento se [place] para tratar la anemia por deficiencia de hierro. Este medicamento puede ser utilizado para otros usos; si tiene alguna pregunta consulte con su proveedor de atencin mdica o con su farmacutico. MARCAS COMUNES: Feraheme Qu le debo informar a mi profesional de la salud antes de tomar este medicamento? Necesita saber si usted presenta alguno de los [employer] o situaciones: anemia no provocada por niveles bajos de hierro niveles altos de hierro en la sangre examen de imgenes por Health visitor (IRM) programado una reaccin alrgica o inusual al hierro, otros medicamentos, alimentos, colorantes o conservantes si est embarazada o buscando quedar embarazada si est amamantando a un beb Cmo debo Insurance account manager medicamento? Este medicamento se administra mediante inyeccin por va intravenosa. Lo administra un profesional de Technical sales engineer en un hospital o en un entorno clnico. Hable con su pediatra para informarse acerca del uso de este medicamento en nios. Puede requerir atencin especial. Sobredosis: Pngase en contacto inmediatamente con un centro toxicolgico o una sala de urgencia si usted cree que haya tomado demasiado medicamento. ATENCIN: [employer] es solo para usted. No comparta este medicamento con nadie. Qu sucede si me olvido de una dosis? Es importante no olvidar ninguna dosis. Informe a su mdico o a su profesional de la salud si no puede asistir a Photographer. Qu puede interactuar con este medicamento? Esta medicina puede interactuar con los siguientes medicamentos: otros productos de hierro Puede ser que esta lista no menciona todas las posibles interacciones. Informe a su profesional de [employer]	[place] de [employer] productos a base de hierbas,  medicamentos de [place] o suplementos nutritivos que est tomando. Si usted fuma, consume bebidas alcohlicas o si utiliza drogas ilegales, indqueselo tambin a su profesional de [employer]	[place]. Algunas sustancias pueden interactuar con su medicamento. A qu debo estar atento al usar [employer]? Visite a su mdico o a su profesional de la salud de [place] regular. Si los sntomas no comienzan a mejorar o si empeoran, consulte con su mdico o con su profesional de [employer]	[place]. Tal vez necesita realizarse anlisis de sangre mientras recibe [place]. Tal vez necesita seguir Counselling psychologist. Consulte a su mdico. Los alimentos que contienen hierro incluyen: alimentos integrales o con cereales, frutas secas, frijoles o arvejas, vegetales de hoja verde y carne que proviene de rganos (hgado, rin). Qu efectos secundarios puedo tener al [employer] este medicamento? Efectos secundarios que debe informar a su mdico o a Barrister's clerk de la salud tan pronto como sea posible: Chief of Staff, como erupcin cutnea, picazn o urticarias, e hinchazn de la cara, los labios o la lengua problemas respiratorios cambios en la presin sangunea sensacin de desmayos o aturdimiento, cadas fiebre o escalofros enrojecimiento, sudoracin o sensacin de calor hinchazn de tobillos o pies Efectos secundarios que generalmente no requieren atencin mdica (infrmelos a su mdico o a Barrister's clerk de la salud si persisten o si son molestos): diarrea dolor de cabeza nuseas, vmito dolor estomacal Puede ser que esta lista no menciona todos los posibles efectos secundarios. Comunquese a su mdico por asesoramiento mdico [employer] Usted puede informar los efectos secundarios a la FDA por telfono al 1-800-FDA-1088. Dnde debo guardar mi medicina? Este medicamento se administra en hospitales o clnicas y no necesitar guardarlo en su domicilio. ATENCIN: Este folleto es  un resumen. Puede  ser que no cubra toda la posible informacin. Si usted tiene preguntas acerca de esta medicina, consulte con su mdico, su farmacutico o su profesional de Technical sales engineer.  2022 Elsevier/Gold Standard (2016-12-20 00:00:00)

## 2021-07-05 NOTE — Progress Notes (Signed)
Pt observed for 30 mins post Feraheme infusion.  Pt tolerated treatment well.  VSS at discharge.  Ambulatory to lobby.

## 2021-07-26 NOTE — Progress Notes (Addendum)
Cardiology Office Note   Date:  07/27/2021   ID:  Raven Turner, Raven Turner 06/20/1971, MRN [id number]  PCP:  [name], NP  Cardiologist:   Dorris Carnes, MD   F/U of HTN and CAD    History of Present Illness: Raven Turner is a 50 y.o. female with a history of HTN, DM, COPD (severe), morbid obesity and chest pressure.  Hx of coronary calcifications on CT (LAD)  Myovue in 2016 showed no ischemia  SHe had a carotid USN in September 2020   THis showed mild carotid plaquing     I saw the pt in AUg 2021  the pt says sy had an epsidoe of pain and burning in L chest radiating to L arm and Jaw in June   Lasted hours   SOme SOB     Since then she has had a couple times but not as intense.   LAst episode was 1 month ago  She is not too active  Notes some SOB with activity   No dizziness  No palpitations                                                                     Current Meds  Medication Sig   albuterol (VENTOLIN HFA) 108 (90 Base) MCG/ACT inhaler Inhale 2 puffs into the lungs every 6 (six) hours as needed for wheezing or shortness of breath.   albuterol (VENTOLIN HFA) 108 (90 Base) MCG/ACT inhaler Inhale 2 puffs into the lungs every 4 (four) hours as needed for wheezing or shortness of breath (((PLAN B))).   aspirin EC 81 MG tablet Take 1 tablet (81 mg total) by mouth daily.   BD INSULIN SYRINGE U-500 31G X 6MM 0.5 ML MISC U UTD TID SUBCUTANEOUSLY   Blood Glucose Monitoring Suppl (ACCU-CHEK AVIVA PLUS) W/DEVICE KIT Used as directed   Buprenorphine HCl (BELBUCA) 450 MCG FILM Place inside cheek.   cabergoline (DOSTINEX) 0.5 MG tablet Take 0.5 tablets (0.25 mg total) by mouth 2 (two) times a week.   calcium carbonate (OS-CAL) 600 MG TABS tablet Take 1 tablet by mouth daily.   cholecalciferol (VITAMIN D) 1000 UNITS tablet Take 1,000 Units by mouth every morning.   cloNIDine (CATAPRES) 0.2 MG tablet Take 1 tablet (0.2 mg total) by mouth 2 (two) times daily.    dexlansoprazole (DEXILANT) 60 MG capsule Take 60 mg by mouth daily.   dupilumab (DUPIXENT) 300 MG/2ML prefilled syringe Inject 300 mg into the skin every 14 (fourteen) days.   folic acid (FOLVITE) 462 MCG tablet Take 400 mcg by mouth daily.   furosemide (LASIX) 20 MG tablet Take 1 tablet (20 mg total) by mouth every morning.   gabapentin (NEURONTIN) 400 MG capsule Take 2 capsules (800 mg total) by mouth 3 (three) times daily.   glucose blood (RELION GLUCOSE TEST STRIPS) test strip 1 each by Other route 4 (four) times daily. ICD code E11.65   hydrOXYzine (ATARAX/VISTARIL) 25 MG tablet Take 25 mg by mouth at bedtime as needed.   insulin aspart (NOVOLOG) 100 UNIT/ML FlexPen Inject 12 Units into the skin 3 (three) times daily.   Lancet Devices (ACCU-CHEK SOFTCLIX) lancets Use as instructed   levothyroxine (SYNTHROID, LEVOTHROID) 75 MCG tablet Take 112 mcg  by mouth daily before breakfast.    linaclotide (LINZESS) 145 MCG CAPS capsule Take 145 mcg by mouth daily before breakfast.   losartan (COZAAR) 100 MG tablet Take 1 tablet (100 mg total) by mouth every morning.   methocarbamol (ROBAXIN) 750 MG tablet Take 1 tablet (750 mg total) by mouth 3 (three) times daily. MUST MAKE APPT FOR FURTHER REFILLS   methotrexate (RHEUMATREX) 2.5 MG tablet Take 2.5 mg by mouth once a week. 8 pills wkly .Caution:Chemotherapy. Protect from light.    metoprolol tartrate (LOPRESSOR) 100 MG tablet TAKE 1 TAB AN HOUR BEFORE CT   Misc. Devices (CANE) MISC 1 each by Does not apply route daily.   montelukast (SINGULAIR) 10 MG tablet Take 1 tablet (10 mg total) by mouth at bedtime.   PARoxetine (PAXIL) 20 MG tablet Take 1 tablet (20 mg total) by mouth daily. MUST MAKE APPT FOR FURTHER REFILLS   rosuvastatin (CRESTOR) 20 MG tablet Take 1 tablet (20 mg total) by mouth daily.   spironolactone (ALDACTONE) 25 MG tablet Take 25 mg by mouth daily.   terbinafine (LAMISIL) 250 MG tablet Take 1 tablet (250 mg total) by mouth daily.    Tiotropium Bromide Monohydrate (SPIRIVA RESPIMAT) 1.25 MCG/ACT AERS Inhale 2 puffs into the lungs daily.   TOUJEO MAX SOLOSTAR 300 UNIT/ML Solostar Pen SMARTSIG:60 Unit(s) SUB-Q Every Night   traMADol (ULTRAM) 50 MG tablet Take by mouth every 6 (six) hours as needed.   traZODone (DESYREL) 50 MG tablet Take 50 mg by mouth at bedtime.   verapamil (CALAN-SR) 180 MG CR tablet Take 2 tablets (360 mg total) by mouth at bedtime.   Current Facility-Administered Medications for the 07/27/21 encounter (Office Visit) with Fay Records, MD  Medication   betamethasone acetate-betamethasone sodium phosphate (CELESTONE) injection 3 mg     Allergies:   Hydrocortisone, Shellfish allergy, and Sulfamethoxazole-trimethoprim   Past Medical History:  Diagnosis Date   Anemia    Asthma    Bilateral hand numbness 06/25/2015   Bone pain 02/27/2015   Diffuse bone pain in legs mostly but also in arms     Breast pain, left 03/27/2015   Callus of heel 12/16/2014   Compression fracture    Compression fracture of L1 lumbar vertebra ([place]) 08/26/2014   Compression fracture of T12 vertebra ([place]) 08/28/2014   COPD (chronic obstructive pulmonary disease) ([place]) 2013   Cushing syndrome ([place]) 2013    Depression    Diabetes ([place]) 2013    Diabetes mellitus type 2, uncontrolled 08/26/2014   Sees Dr. Katy Fitch.  Last visit 11/27/14.  No diabetic retinopathy     Diabetes mellitus with neuropathy (HCC)    Diastolic dysfunction without heart failure 10/14/2014   Grade 2 diastolic dysfunction 2/2 pulm HTN    Generalized anxiety disorder 03/27/2015   HTN (hypertension) 2005    Hx of cataract surgery 11/03/2014   LUQ abdominal pain 07/17/2015   Medication management 05/12/2015   Morbid obesity ([place]) 09/08/2014   MRSA colonization 12/15/2014   Non-English speaking patient 09/08/2014   OSA (obstructive sleep apnea) 05/20/2015   Osteopenia 11/19/2013   Dx with osteopenia in lumbar vertebra with partial compression of L1   Pain due to  onychomycosis of toenail 03/27/2015   Secondary Cushing's syndrome/ iatrogenic    Skin rash 07/17/2015   Tachycardia 01/13/2015   Upper airway cough syndrome 09/05/2014   Followed in Pulmonary clinic/ [facility]/ [place]  - Trial off advair      09/05/2014 >>>  - trial  off theoph 09/17/2014 and added flutter  - rec reduce dulera to 100 2 bid 07/13/2015      Vision changes 06/25/2015   Vitamin D deficiency 08/28/2014    Past Surgical History:  Procedure Laterality Date   CATARACT EXTRACTION Bilateral 2014   HAND SURGERY Right 12/18/2015   TUBAL LIGATION  1993     Social History:  The patient  reports that she has never smoked. She has never used smokeless tobacco. She reports that she does not drink alcohol and does not use drugs.   Family History:  The patient's family history includes Asthma in her father; Diabetes in her mother; Heart disease in her father; High Cholesterol in her father and mother; High blood pressure in her father and mother; Hypertension in her brother and sister; Stroke in her father; Sudden death in her father.    ROS:  Please see the history of present illness. All other systems are reviewed and  Negative to the above problem except as noted.    PHYSICAL EXAM: VS:  BP 114/62   Pulse 81   Ht 4' 10"  (1.473 m)   Wt 180 lb 12.8 oz (82 kg)   SpO2 98%   BMI 37.79 kg/m   GEN: Morbidly obese 50 yo in no acute distress  Neck: JVP normal  NO, carotid bruits, Cardiac: RRR; Gr II/VI systolic murmur LSB   No LE edema Respiratory: Normal airflow  NO wheezes   GI: soft obese   Skin: warm and dry, no rash Neuro:  Strength and sensation are intact Psych: euthymic mood, full affect   EKG:  EKG is ordered today.  SR RBBB      Stress test 04/2015 Nuclear stress EF: 58%. There was no ST segment deviation noted during stress. This is a low risk study. The left ventricular ejection fraction is normal (55-65%). There is apical thinning but no evidence of  infarction or ischemia.   Echo 10/2014 - Left ventricle: The cavity size was normal. Wall thickness was   increased in a pattern of mild LVH. Systolic function was normal.   The estimated ejection fraction was in the range of 50% to 55%.   Wall motion was normal; there were no regional wall motion   abnormalities. Features are consistent with a pseudonormal left   ventricular filling pattern, with concomitant abnormal relaxation   and increased filling pressure (grade 2 diastolic dysfunction). - Aortic valve: There was trivial regurgitation. . Lipid Panel    Component Value Date/Time   CHOL 128 04/30/2020 1002   TRIG 106 04/30/2020 1002   HDL 45 04/30/2020 1002   CHOLHDL 2.8 04/30/2020 1002   CHOLHDL 4 12/15/2014 1051   VLDL 38.4 12/15/2014 1051   LDLCALC 63 04/30/2020 1002      Wt Readings from Last 3 Encounters:  07/27/21 180 lb 12.8 oz (82 kg)  06/28/21 178 lb 12 oz (81.1 kg)  06/28/21 179 lb (81.2 kg)      ASSESSMENT AND PLAN:  1  Chest pressure   She has had atypical CP in past   This seems different    I would reocmm CT coronary angiogram to define   She has a seafood allerg but she had a CT scan done with contrast in past without premedication and did ok     2  CAD   Coronary calcifications on CT scan As noted above  3  Pulm  Moving air well  4  HTN  Continue to follow BP  Good today    5  PV dz   MIld plaquing   Check chol   Keep on statin   6   HL  LDL 63  HDL 45  Trig  106  Follow     7  Morbid obesity   Discussed diet   Needs to limit carbs and sugars    Discussed TRE  Gave her reference for fat fiction    Current medicines are reviewed at length with the patient today.  The patient does not have concerns regarding medicines.  Signed, Dorris Carnes, MD  07/27/2021 6:16 PM    [facility] [place], [place], [place]  [address] Phone: [phone]; Fax: [phone]

## 2021-07-27 ENCOUNTER — Encounter: Payer: Self-pay | Admitting: Internal Medicine

## 2021-07-27 ENCOUNTER — Ambulatory Visit (INDEPENDENT_AMBULATORY_CARE_PROVIDER_SITE_OTHER): Payer: Medicaid Other | Admitting: Internal Medicine

## 2021-07-27 ENCOUNTER — Other Ambulatory Visit: Payer: Self-pay

## 2021-07-27 VITALS — BP 114/62 | HR 81 | Ht <= 58 in | Wt 180.8 lb

## 2021-07-27 DIAGNOSIS — I10 Essential (primary) hypertension: Secondary | ICD-10-CM

## 2021-07-27 DIAGNOSIS — R079 Chest pain, unspecified: Secondary | ICD-10-CM

## 2021-07-27 DIAGNOSIS — Z01812 Encounter for preprocedural laboratory examination: Secondary | ICD-10-CM

## 2021-07-27 LAB — BASIC METABOLIC PANEL
BUN/Creatinine Ratio: 17 (ref 9–23)
BUN: 13 mg/dL (ref 6–24)
CO2: 24 mmol/L (ref 20–29)
Calcium: 9.3 mg/dL (ref 8.7–10.2)
Chloride: 101 mmol/L (ref 96–106)
Creatinine, Ser: 0.76 mg/dL (ref 0.57–1.00)
Glucose: 89 mg/dL (ref 70–99)
Potassium: 4.1 mmol/L (ref 3.5–5.2)
Sodium: 140 mmol/L (ref 134–144)
eGFR: 96 mL/min/{1.73_m2} (ref >59)

## 2021-07-27 MED ORDER — METOPROLOL TARTRATE 100 MG PO TABS: ORAL_TABLET | ORAL | 0 refills | Status: AC

## 2021-07-27 NOTE — Patient Instructions (Addendum)
Medication Instructions:  NO CHANGES *If you need a refill on your cardiac medications before your next appointment, please call your [facility]  BMET  If you have labs (blood work) drawn today and your tests are completely normal, you will receive your results only by: [place] (if you have MyChart) OR A paper copy in the mail If you have any lab test that is abnormal or we need to change your treatment, we will call you to review the results.   Testing/Procedures:   Your cardiac CT will be scheduled at one of the below locations:   [facility] [address] [place], [place] [address] [phone]  If scheduled at [facility], please arrive at the [facility] main entrance (entrance A) of [facility] 30 minutes prior to test start time. You can use the FREE valet parking offered at the main entrance (encouraged to control the heart rate for the test) Proceed to the [facility] Radiology Department (first floor) to check-in and test prep.   Please follow these instructions carefully (unless otherwise directed):    On the Night Before the Test: Be sure to Drink plenty of water. Do not consume any caffeinated/decaffeinated beverages or chocolate 12 hours prior to your test. Do not take any antihistamines 12 hours prior to your test. If the patient has contrast allergy: P On the Day of the Test: Drink plenty of water until 1 hour prior to the test. Do not eat any food 4 hours prior to the test. You may take your regular medications prior to the test.  Take metoprolol (Lopressor) two hours prior to test. HOLD Furosemide/Hydrochlorothiazide morning of the test. FEMALES- please wear underwire-free bra if available, avoid dresses & tight clothing   *For Clinical Staff only. Please instruct patient the following:* Heart Rate Medication Recommendations for Cardiac CT  Resting HR < 50 bpm  No medication  Resting HR 50-60  bpm and BP >110/50 mmHG   Consider Metoprolol tartrate 25 mg PO 90-120 min prior to scan  Resting HR 60-65 bpm and BP >110/50 mmHG  Metoprolol tartrate 50 mg PO 90-120 minutes prior to scan   Resting HR > 65 bpm and BP >110/50 mmHG  After the Test: Drink plenty of water. After receiving IV contrast, you may experience a mild flushed feeling. This is normal. On occasion, you may experience a mild rash up to 24 hours after the test. This is not dangerous. If this occurs, you can take Benadryl 25 mg and increase your fluid intake. If you experience trouble breathing, this can be serious. If it is severe call 911 IMMEDIATELY. If it is mild, please call our office. If you take any of these medications: Glipizide/Metformin, Avandament, Glucavance, please do not take 48 hours after completing test unless otherwise instructed.  Please allow 2-4 weeks for scheduling of routine cardiac CTs. Some insurance companies require a pre-authorization which may delay scheduling of this test.   For non-scheduling related questions, please contact the cardiac imaging nurse navigator should you have any questions/concerns: Marchia Bond, Cardiac Imaging Nurse Navigator Gordy Clement, Cardiac Imaging Nurse Navigator [facility] Direct Office Dial: [phone]   For scheduling needs, including cancellations and rescheduling, please call [place], [phone].   Follow-Up: At [facility], you and your health needs are our priority.  As part of our continuing mission to provide you with exceptional heart care, we have created designated Provider Care Teams.  These  Care Teams include your primary Cardiologist (physician) and Advanced Practice Providers (APPs -  Physician Assistants and Nurse Practitioners) who all work together to provide you with the care you need, when you need it.  We recommend signing up for the patient portal called "MyChart".  Sign up information is provided on  this After Visit Summary.  MyChart is used to connect with patients for Virtual Visits (Telemedicine).  Patients are able to view lab/test results, encounter notes, upcoming appointments, etc.  Non-urgent messages can be sent to your provider as well.   To learn more about what you can do with MyChart, go to [email or web address].    Your next appointment:   1 year(s)  The format for your next appointment:   In Person  Provider:   Dorris Carnes, MD     Other Instructions NONE

## 2021-08-06 ENCOUNTER — Telehealth (HOSPITAL_COMMUNITY): Payer: Self-pay | Admitting: Emergency Medicine

## 2021-08-06 NOTE — Telephone Encounter (Signed)
Reaching out to patient to offer assistance regarding upcoming cardiac imaging study; pt verbalizes understanding of appt date/time, parking situation and where to check in, pre-test NPO status and medications ordered, and verified current allergies; name and call back number provided for further questions should they arise Marchia Bond RN Navigator Cardiac Imaging [facility] [phone] office [phone] cell  Arrival 1230p 157m metoprolol tart [place] used for translation [phone]

## 2021-08-09 ENCOUNTER — Other Ambulatory Visit: Payer: Self-pay

## 2021-08-09 ENCOUNTER — Ambulatory Visit (HOSPITAL_COMMUNITY)
Admission: RE | Admit: 2021-08-09 | Discharge: 2021-08-09 | Disposition: A | Discharge status: Home / Self Care | Payer: Medicaid Other | Source: Ambulatory Visit | Attending: Internal Medicine | Admitting: Internal Medicine

## 2021-08-09 DIAGNOSIS — R079 Chest pain, unspecified: Secondary | ICD-10-CM | POA: Insufficient documentation

## 2021-08-09 MED ORDER — NITROGLYCERIN 0.4 MG SL SUBL
SUBLINGUAL_TABLET | SUBLINGUAL | Status: AC
  Filled 2021-08-09: qty 2

## 2021-08-09 MED ORDER — IOHEXOL 350 MG/ML SOLN
95.0000 mL | Freq: Once | INTRAVENOUS | Status: AC | PRN
  Administered 2021-08-09: 14:00:00 95 mL via INTRAVENOUS

## 2021-08-09 MED ORDER — NITROGLYCERIN 0.4 MG SL SUBL
0.8000 mg | SUBLINGUAL_TABLET | Freq: Once | SUBLINGUAL | Status: AC
  Administered 2021-08-09: 13:00:00 0.8 mg via SUBLINGUAL

## 2021-09-11 ENCOUNTER — Encounter (HOSPITAL_COMMUNITY): Payer: Self-pay | Admitting: Emergency Medicine

## 2021-09-11 ENCOUNTER — Other Ambulatory Visit: Payer: Self-pay

## 2021-09-11 ENCOUNTER — Emergency Department (HOSPITAL_COMMUNITY)
Admission: EM | Admit: 2021-09-11 | Discharge: 2021-09-12 | Disposition: A | Discharge status: Home / Self Care | Payer: Medicaid Other | Attending: Emergency Medicine | Admitting: Emergency Medicine

## 2021-09-11 DIAGNOSIS — Z7982 Long term (current) use of aspirin: Secondary | ICD-10-CM | POA: Diagnosis not present

## 2021-09-11 DIAGNOSIS — A084 Viral intestinal infection, unspecified: Secondary | ICD-10-CM | POA: Diagnosis not present

## 2021-09-11 DIAGNOSIS — E1140 Type 2 diabetes mellitus with diabetic neuropathy, unspecified: Secondary | ICD-10-CM | POA: Diagnosis not present

## 2021-09-11 DIAGNOSIS — N182 Chronic kidney disease, stage 2 (mild): Secondary | ICD-10-CM | POA: Insufficient documentation

## 2021-09-11 DIAGNOSIS — E039 Hypothyroidism, unspecified: Secondary | ICD-10-CM | POA: Diagnosis not present

## 2021-09-11 DIAGNOSIS — Z79899 Other long term (current) drug therapy: Secondary | ICD-10-CM | POA: Insufficient documentation

## 2021-09-11 DIAGNOSIS — J449 Chronic obstructive pulmonary disease, unspecified: Secondary | ICD-10-CM | POA: Insufficient documentation

## 2021-09-11 DIAGNOSIS — R112 Nausea with vomiting, unspecified: Secondary | ICD-10-CM | POA: Diagnosis present

## 2021-09-11 DIAGNOSIS — E1122 Type 2 diabetes mellitus with diabetic chronic kidney disease: Secondary | ICD-10-CM | POA: Diagnosis not present

## 2021-09-11 DIAGNOSIS — I129 Hypertensive chronic kidney disease with stage 1 through stage 4 chronic kidney disease, or unspecified chronic kidney disease: Secondary | ICD-10-CM | POA: Insufficient documentation

## 2021-09-11 DIAGNOSIS — J45909 Unspecified asthma, uncomplicated: Secondary | ICD-10-CM | POA: Insufficient documentation

## 2021-09-11 DIAGNOSIS — Z794 Long term (current) use of insulin: Secondary | ICD-10-CM | POA: Insufficient documentation

## 2021-09-11 LAB — CBC WITH DIFFERENTIAL/PLATELET
Abs Immature Granulocytes: 0.06 10*3/uL (ref 0.00–0.07)
Basophils Absolute: 0.1 10*3/uL (ref 0.0–0.1)
Basophils Relative: 0 %
Eosinophils Absolute: 0.2 10*3/uL (ref 0.0–0.5)
Eosinophils Relative: 1 %
HCT: 42.4 % (ref 36.0–46.0)
Hemoglobin: 13.7 g/dL (ref 12.0–15.0)
Immature Granulocytes: 0 %
Lymphocytes Relative: 13 %
Lymphs Abs: 1.9 10*3/uL (ref 0.7–4.0)
MCH: 29 pg (ref 26.0–34.0)
MCHC: 32.3 g/dL (ref 30.0–36.0)
MCV: 89.6 fL (ref 80.0–100.0)
Monocytes Absolute: 0.9 10*3/uL (ref 0.1–1.0)
Monocytes Relative: 6 %
Neutro Abs: 11.5 10*3/uL — ABNORMAL HIGH (ref 1.7–7.7)
Neutrophils Relative %: 80 %
Platelets: 324 10*3/uL (ref 150–400)
RBC: 4.73 MIL/uL (ref 3.87–5.11)
RDW: 14.6 % (ref 11.5–15.5)
WBC: 14.6 10*3/uL — ABNORMAL HIGH (ref 4.0–10.5)
nRBC: 0 % (ref 0.0–0.2)

## 2021-09-11 LAB — COMPREHENSIVE METABOLIC PANEL
ALT: 23 U/L (ref 0–44)
AST: 22 U/L (ref 15–41)
Albumin: 4.1 g/dL (ref 3.5–5.0)
Alkaline Phosphatase: 77 U/L (ref 38–126)
Anion gap: 7 (ref 5–15)
BUN: 14 mg/dL (ref 6–20)
CO2: 29 mmol/L (ref 22–32)
Calcium: 9.6 mg/dL (ref 8.9–10.3)
Chloride: 103 mmol/L (ref 98–111)
Creatinine, Ser: 0.84 mg/dL (ref 0.44–1.00)
GFR, Estimated: >60 mL/min (ref >60)
Glucose, Bld: 80 mg/dL (ref 70–99)
Potassium: 4.4 mmol/L (ref 3.5–5.1)
Sodium: 139 mmol/L (ref 135–145)
Total Bilirubin: 0.6 mg/dL (ref 0.3–1.2)
Total Protein: 8 g/dL (ref 6.5–8.1)

## 2021-09-11 LAB — URINALYSIS, ROUTINE W REFLEX MICROSCOPIC
Bilirubin Urine: NEGATIVE
Glucose, UA: NEGATIVE mg/dL
Hgb urine dipstick: NEGATIVE
Ketones, ur: 5 mg/dL — AB
Leukocytes,Ua: NEGATIVE
Nitrite: NEGATIVE
Protein, ur: 100 mg/dL — AB
Specific Gravity, Urine: 1.025 (ref 1.005–1.030)
pH: 5 (ref 5.0–8.0)

## 2021-09-11 LAB — LIPASE, BLOOD: Lipase: 38 U/L (ref 11–51)

## 2021-09-11 MED ORDER — ONDANSETRON 4 MG PO TBDP
4.0000 mg | ORAL_TABLET | Freq: Once | ORAL | Status: AC
  Administered 2021-09-11: 4 mg via ORAL
  Filled 2021-09-11: qty 1

## 2021-09-11 MED ORDER — PROMETHAZINE HCL 25 MG PO TABS: 25.0000 mg | ORAL_TABLET | Freq: Four times a day (QID) | ORAL | 0 refills | Status: AC | PRN

## 2021-09-11 NOTE — ED Provider Notes (Signed)
Emergency Medicine Provider Triage Evaluation Note  Raven Turner , a 50 y.o. female  was evaluated in triage.  Pt complains of nausea and vomiting since 430 today after eating breakfast.  Found to have upper and right side abdominal pain.  Denies fevers, chills, sick contacts, changes in bowel or bladder habits. Prior abdominal surgery includes "only to prevent having children" Review of Systems  Positive: Abdominal pain, nausea, vomiting Negative: Dysuria, diarrhea, constipation  Physical Exam  BP (!) 145/72    Pulse 91    Temp 98.7 F (37.1 C) (Oral)    Resp 18    SpO2 96%  Gen:   Awake, no distress   Resp:  Normal effort  MSK:   Moves extremities without difficulty  Other:  TTP upper and right side abdomen  Medical Decision Making  Medically screening exam initiated at 6:57 PM.  Appropriate orders placed.  Raven Turner was informed that the remainder of the evaluation will be completed by another provider, this initial triage assessment does not replace that evaluation, and the importance of remaining in the ED until their evaluation is complete.     Tacy Learn, PA-C 09/11/21 Garald Braver, MD 09/12/21 [phone]

## 2021-09-11 NOTE — ED Provider Notes (Signed)
[facility] EMERGENCY DEPARTMENT Provider Note   CSN: [id number] Arrival date & time: 09/11/21  1845     History Chief Complaint  Patient presents with   Emesis    Raven Turner is a 50 y.o. female with past medical history significant for COPD, Cushing syndrome, DM, HTN, obesity, arthritis who presents with abdominal pain, nausea, and vomiting.  The patient was in her usual state of health until this afternoon when she started to have upper abdominal pain.  Shortly after she had onset of nausea and nonbilious, nonbloody vomiting.  She has not had any fevers, chills, cough, congestion, shortness of breath, chest pain, urinary symptoms, or changes in bowel movements.  She took her home Zofran without relief.  She has not tried eating or drinking anything since earlier this afternoon when she was throwing up.    Past Medical History:  Diagnosis Date   Anemia    Asthma    Bilateral hand numbness 06/25/2015   Bone pain 02/27/2015   Diffuse bone pain in legs mostly but also in arms     Breast pain, left 03/27/2015   Callus of heel 12/16/2014   Compression fracture    Compression fracture of L1 lumbar vertebra ([place]) 08/26/2014   Compression fracture of T12 vertebra ([place]) 08/28/2014   COPD (chronic obstructive pulmonary disease) ([place]) 2013   Cushing syndrome ([place]) 2013    Depression    Diabetes ([place]) 2013    Diabetes mellitus type 2, uncontrolled 08/26/2014   Sees Dr. Katy Fitch.  Last visit 11/27/14.  No diabetic retinopathy     Diabetes mellitus with neuropathy (HCC)    Diastolic dysfunction without heart failure 10/14/2014   Grade 2 diastolic dysfunction 2/2 pulm HTN    Generalized anxiety disorder 03/27/2015   HTN (hypertension) 2005    Hx of cataract surgery 11/03/2014   LUQ abdominal pain 07/17/2015   Medication management 05/12/2015   Morbid obesity ([place]) 09/08/2014   MRSA colonization 12/15/2014   Non-English speaking patient 09/08/2014   OSA (obstructive  sleep apnea) 05/20/2015   Osteopenia 11/19/2013   Dx with osteopenia in lumbar vertebra with partial compression of L1   Pain due to onychomycosis of toenail 03/27/2015   Secondary Cushing's syndrome/ iatrogenic    Skin rash 07/17/2015   Tachycardia 01/13/2015   Upper airway cough syndrome 09/05/2014   Followed in Pulmonary clinic/ [facility]/ [place]  - Trial off advair      09/05/2014 >>>  - trial off theoph 09/17/2014 and added flutter  - rec reduce dulera to 100 2 bid 07/13/2015      Vision changes 06/25/2015   Vitamin D deficiency 08/28/2014    Patient Active Problem List   Diagnosis Date Noted   Chronic inflammatory arthritis 10/20/2020   Current chronic use of systemic steroids 03/09/2020   Severe persistent asthma without complication 47/42/5956   CKD (chronic kidney disease) stage 2, GFR 60-89 ml/min 05/08/2019   Arthritis of carpometacarpal (CMC) joint of right thumb 03/07/2019   Soft tissue mass 03/07/2019   Bilateral nephrolithiasis 11/07/2018   Hypothyroidism 11/07/2018   Microscopic hematuria 11/07/2018   Morbid obesity with BMI of 45.0-49.9, adult ([place]) 11/07/2018   Menorrhagia with regular cycle 09/11/2018   Dizziness 09/11/2018   Iron deficiency anemia 06/25/2018   Anemia 05/09/2018   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   Elevated prolactin level 01/19/2017   Microalbuminuria due to type 2 diabetes mellitus ([place]) 12/19/2016   Elevated TSH 12/19/2016   Menstrual changes 12/19/2016  Flaking of scalp 07/18/2016   Acral type peeling skin syndrome 07/18/2016   Non-traumatic compression fracture of vertebral column ([place]) 01/20/2016   Bilateral carpal tunnel syndrome 12/21/2015   Chronic back pain 11/05/2015   NASH (nonalcoholic steatohepatitis) 09/25/2015   Bilateral hand numbness 06/25/2015   OSA (obstructive sleep apnea) 05/20/2015   Medication management 05/12/2015   Generalized anxiety disorder 03/27/2015   Breast pain, left 03/27/2015   Callus of heel  12/16/2014   MRSA colonization 12/15/2014   Hx of cataract surgery 37/54/3606   Diastolic dysfunction without heart failure 10/14/2014   Morbid obesity ([place]) 09/08/2014   Non-English speaking patient 09/08/2014   Upper airway cough syndrome 09/05/2014   Vitamin D deficiency 08/28/2014   Compression fracture of T12 vertebra ([place]) 08/28/2014   Diabetes mellitus type 2, uncontrolled 08/26/2014   Compression fracture of L1 lumbar vertebra ([place]) 08/26/2014   Type 2 diabetes mellitus with microalbuminuria ([place]) 08/26/2014   Essential (primary) hypertension    Secondary Cushing's syndrome/ iatrogenic    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    Past Surgical History:  Procedure Laterality Date   CATARACT EXTRACTION Bilateral 2014   HAND SURGERY Right 12/18/2015   TUBAL LIGATION  1993     OB History     Gravida  3   Para  3   Term      Preterm      AB      Living  3      SAB      IAB      Ectopic      Multiple      Live Births  3           Family History  Problem Relation Age of Onset   Diabetes Mother    High blood pressure Mother    High Cholesterol Mother    Stroke Father    Asthma Father    High blood pressure Father    High Cholesterol Father    Heart disease Father    Sudden death Father    Hypertension Sister    Hypertension Brother     Social History   Tobacco Use   Smoking status: Never   Smokeless tobacco: Never  Vaping Use   Vaping Use: Never used  Substance Use Topics   Alcohol use: No    Alcohol/week: 0.0 standard drinks   Drug use: No    Home Medications Prior to Admission medications   Medication Sig Start Date End Date Taking? Authorizing Provider  promethazine (PHENERGAN) 25 MG tablet Take 1 tablet (25 mg total) by mouth every 6 (six) hours as needed for nausea or vomiting. 09/11/21  Yes Yeriel Mineo, Amalia Hailey, MD  albuterol (VENTOLIN HFA) 108 (90 Base) MCG/ACT inhaler Inhale 2 puffs into the lungs every 6 (six) hours as needed  for wheezing or shortness of breath. 08/18/20   Margaretha Seeds, MD  albuterol (VENTOLIN HFA) 108 (90 Base) MCG/ACT inhaler Inhale 2 puffs into the lungs every 4 (four) hours as needed for wheezing or shortness of breath (((PLAN B))). 04/23/21   Margaretha Seeds, MD  aspirin EC 81 MG tablet Take 1 tablet (81 mg total) by mouth daily. 10/31/17   Ladell Pier, MD  BD INSULIN SYRINGE U-500 31G X 6MM 0.5 ML MISC U UTD TID SUBCUTANEOUSLY 12/01/18   [provider]  Blood Glucose Monitoring Suppl (ACCU-CHEK AVIVA PLUS) W/DEVICE KIT Used as directed 11/05/14   Boykin Nearing, MD  Buprenorphine HCl (BELBUCA) 450 MCG  FILM Place inside cheek.    [provider]  cabergoline (DOSTINEX) 0.5 MG tablet Take 0.5 tablets (0.25 mg total) by mouth 2 (two) times a week. 02/05/18   Marcine Matar, MD  calcium carbonate (OS-CAL) 600 MG TABS tablet Take 1 tablet by mouth daily.    [provider]  cholecalciferol (VITAMIN D) 1000 UNITS tablet Take 1,000 Units by mouth every morning.    [provider]  cloNIDine (CATAPRES) 0.2 MG tablet Take 1 tablet (0.2 mg total) by mouth 2 (two) times daily. 04/30/20   Pricilla Riffle, MD  dexlansoprazole (DEXILANT) 60 MG capsule Take 60 mg by mouth daily.    [provider]  dupilumab (DUPIXENT) 300 MG/2ML prefilled syringe Inject 300 mg into the skin every 14 (fourteen) days. 05/28/21   Luciano Cutter, MD  folic acid (FOLVITE) 400 MCG tablet Take 400 mcg by mouth daily.    [provider]  furosemide (LASIX) 20 MG tablet Take 1 tablet (20 mg total) by mouth every morning. 10/31/17   Marcine Matar, MD  gabapentin (NEURONTIN) 400 MG capsule Take 2 capsules (800 mg total) by mouth 3 (three) times daily. 04/20/18   Marcine Matar, MD  glucose blood (RELION GLUCOSE TEST STRIPS) test strip 1 each by Other route 4 (four) times daily. ICD code E11.65 10/31/17   Marcine Matar, MD  hydrOXYzine (ATARAX/VISTARIL) 25 MG  tablet Take 25 mg by mouth at bedtime as needed.    [provider]  insulin aspart (NOVOLOG) 100 UNIT/ML FlexPen Inject 12 Units into the skin 3 (three) times daily. 06/22/21   [provider]  Lancet Devices (ACCU-CHEK [place]) lancets Use as instructed 04/03/15   Dessa Phi, MD  levothyroxine (SYNTHROID, LEVOTHROID) 75 MCG tablet Take 112 mcg by mouth daily before breakfast.  11/09/18   [provider]  linaclotide (LINZESS) 145 MCG CAPS capsule Take 145 mcg by mouth daily before breakfast.    [provider]  losartan (COZAAR) 100 MG tablet Take 1 tablet (100 mg total) by mouth every morning. 02/02/18   Marcine Matar, MD  methocarbamol (ROBAXIN) 750 MG tablet Take 1 tablet (750 mg total) by mouth 3 (three) times daily. MUST MAKE APPT FOR FURTHER REFILLS 08/01/18   Hoy Register, MD  methotrexate (RHEUMATREX) 2.5 MG tablet Take 2.5 mg by mouth once a week. 8 pills wkly .Caution:Chemotherapy. Protect from light.     [provider]  metoprolol tartrate (LOPRESSOR) 100 MG tablet TAKE 1 TAB AN HOUR BEFORE CT 07/27/21   Pricilla Riffle, MD  Misc. Devices (CANE) MISC 1 each by Does not apply route daily. 07/18/16   Funches, Gerilyn Nestle, MD  mometasone-formoterol (DULERA) 200-5 MCG/ACT AERO Inhale 2 puffs into the lungs 2 (two) times daily. 04/23/21 05/23/21  Luciano Cutter, MD  montelukast (SINGULAIR) 10 MG tablet Take 1 tablet (10 mg total) by mouth at bedtime. 04/23/21   Luciano Cutter, MD  nitroGLYCERIN (NITROSTAT) 0.4 MG SL tablet Place 1 tablet (0.4 mg total) under the tongue every 5 (five) minutes as needed. 12/25/18 07/10/20  Bhagat, Sharrell Ku, PA  PARoxetine (PAXIL) 20 MG tablet Take 1 tablet (20 mg total) by mouth daily. MUST MAKE APPT FOR FURTHER REFILLS 01/09/19   Hoy Register, MD  rosuvastatin (CRESTOR) 20 MG tablet Take 1 tablet (20 mg total) by mouth daily. 05/10/19   Pricilla Riffle, MD  spironolactone (ALDACTONE) 25 MG tablet Take 25 mg  by mouth daily.  [provider]  terbinafine (LAMISIL) 250 MG tablet Take 1 tablet (250 mg total) by mouth daily. 11/02/20   Edrick Kins, DPM  Tiotropium Bromide Monohydrate (SPIRIVA RESPIMAT) 1.25 MCG/ACT AERS Inhale 2 puffs into the lungs daily. 04/23/21   Margaretha Seeds, MD  TOUJEO MAX SOLOSTAR 300 UNIT/ML Solostar Pen SMARTSIG:60 Unit(s) SUB-Q Every Night 06/12/21   [provider]  traMADol (ULTRAM) 50 MG tablet Take by mouth every 6 (six) hours as needed.    [provider]  traZODone (DESYREL) 50 MG tablet Take 50 mg by mouth at bedtime.    [provider]  verapamil (CALAN-SR) 180 MG CR tablet Take 2 tablets (360 mg total) by mouth at bedtime. 02/02/18   Ladell Pier, MD    Allergies    Hydrocortisone, Shellfish allergy, and Sulfamethoxazole-trimethoprim  Review of Systems   Review of Systems  Constitutional:  Negative for chills and fever.  HENT:  Negative for ear pain and sore throat.   Eyes:  Negative for pain and visual disturbance.  Respiratory:  Negative for cough and shortness of breath.   Cardiovascular:  Negative for chest pain and palpitations.  Gastrointestinal:  Positive for abdominal pain, constipation, nausea and vomiting. Negative for diarrhea.  Genitourinary:  Negative for dysuria and hematuria.  Musculoskeletal:  Negative for arthralgias and back pain.  Skin:  Negative for color change and rash.  Neurological:  Negative for seizures and syncope.  All other systems reviewed and are negative.  Physical Exam Updated Vital Signs BP (!) 145/72    Pulse 91    Temp 98.7 F (37.1 C) (Oral)    Resp 18    LMP 07/20/2018    SpO2 96%   Physical Exam Vitals and nursing note reviewed.  Constitutional:      General: She is not in acute distress.    Appearance: She is well-developed. She is obese.  HENT:     Head: Normocephalic and atraumatic.     Right Ear: External ear normal.     Left Ear: External ear normal.      Nose: Nose normal.     Mouth/Throat:     Mouth: Mucous membranes are moist.     Pharynx: Oropharynx is clear.  Eyes:     Extraocular Movements: Extraocular movements intact.     Conjunctiva/sclera: Conjunctivae normal.     Pupils: Pupils are equal, round, and reactive to light.  Cardiovascular:     Rate and Rhythm: Normal rate and regular rhythm.     Pulses: Normal pulses.     Heart sounds: Normal heart sounds. No murmur heard. Pulmonary:     Effort: Pulmonary effort is normal. No respiratory distress.     Breath sounds: Normal breath sounds. No wheezing, rhonchi or rales.  Abdominal:     General: There is no distension.     Palpations: Abdomen is soft.     Tenderness: There is abdominal tenderness (Mild epigastric, LUQ pain). There is no guarding or rebound.  Musculoskeletal:        General: No swelling.     Cervical back: Neck supple.  Skin:    General: Skin is warm and dry.     Capillary Refill: Capillary refill takes less than 2 seconds.  Neurological:     General: No focal deficit present.     Mental Status: She is alert and oriented to person, place, and time.  Psychiatric:        Mood and Affect: Mood normal.  ED Results / Procedures / Treatments   Labs (all labs ordered are listed, but only abnormal results are displayed) Labs Reviewed  CBC WITH DIFFERENTIAL/PLATELET - Abnormal; Notable for the following components:      Result Value   WBC 14.6 (*)    Neutro Abs 11.5 (*)    All other components within normal limits  URINALYSIS, ROUTINE W REFLEX MICROSCOPIC - Abnormal; Notable for the following components:   APPearance HAZY (*)    Ketones, ur 5 (*)    Protein, ur 100 (*)    Bacteria, UA RARE (*)    All other components within normal limits  COMPREHENSIVE METABOLIC PANEL  LIPASE, BLOOD    EKG None  Radiology No results found.  Procedures Procedures   Medications Ordered in ED Medications  ondansetron (ZOFRAN-ODT) disintegrating tablet 4 mg (4 mg  Oral Given 09/11/21 1906)    ED Course  I have reviewed the triage vital signs and the nursing notes.  Pertinent labs & imaging results that were available during my care of the patient were reviewed by me and considered in my medical decision making (see chart for details).    MDM Rules/Calculators/A&P                          Patient presents with abdominal pain and nausea/vomiting as described in HPI above.  Presentation is most consistent with viral gastritis.  Patient is well-appearing with benign abdominal exam.  She has slight leukocytosis on labs, but CBC is otherwise unremarkable.  CMP is completely normal.  No electrolyte abnormalities or lab evidence of dehydration.  UA not consistent with UTI and patient denies urinary symptoms.  Lipase is normal, doubt pancreatitis.  No elevation in T bili, LFTs, or right upper quadrant tenderness to suggest hepatobiliary pathology.  Do not believe that imaging studies are indicated.  We will plan to discharge patient with prescription for Phenergan since she had persistent nausea with her home Zofran.  Patient is well-appearing and I believe she is appropriate for discharge at this time.  Discharge instructions and return precautions were discussed with the patient prior to discharge and included in AVS.  The patient voiced understanding of these instructions.  The patient was then discharged in stable condition.   Final Clinical Impression(s) / ED Diagnoses Final diagnoses:  Viral gastroenteritis    Rx / DC Orders ED Discharge Orders          Ordered    promethazine (PHENERGAN) 25 MG tablet  Every 6 hours PRN        09/11/21 2253             Varney Baas, MD 09/12/21 1111    Fredia Sorrow, MD 10/10/21 2348

## 2021-09-11 NOTE — ED Triage Notes (Signed)
Pt c/o nausea/vomiting since 4:30pm today. Denies diarrhea, abdominal pain on palpation.

## 2021-09-11 NOTE — Discharge Instructions (Addendum)
Take the phenergan as needed for nausea and vomiting. Do not take it with zofran. Only take one or the other. Follow up with your PCP if your symptoms do not improve.

## 2021-11-02 ENCOUNTER — Other Ambulatory Visit: Payer: Self-pay | Admitting: Pulmonary Disease

## 2021-11-05 ENCOUNTER — Other Ambulatory Visit: Payer: Self-pay | Admitting: Internal Medicine

## 2021-11-08 ENCOUNTER — Other Ambulatory Visit: Payer: Self-pay | Admitting: *Deleted

## 2021-11-08 MED ORDER — CLONIDINE HCL 0.2 MG PO TABS: 0.2000 mg | ORAL_TABLET | Freq: Two times a day (BID) | ORAL | 3 refills | Status: AC

## 2021-11-10 ENCOUNTER — Telehealth: Payer: Self-pay | Admitting: Pharmacist

## 2021-11-10 DIAGNOSIS — J4550 Severe persistent asthma, uncomplicated: Secondary | ICD-10-CM

## 2021-11-10 NOTE — Telephone Encounter (Signed)
Refill pending for DUPIXENT to  [facility] ? ?Dose: 300 mg SQ every 14 days ? ?Last OV: 04/23/21 ?Provider: Dr. Loanne Drilling ? ?Next OV: 11/16/21 ? ?Last filled by pharmacy on 11/04/21. Will await OV before sending refill ? ?Knox Saliva, PharmD, MPH, BCPS ?Clinical Pharmacist (Rheumatology and Pulmonology) ? ?

## 2021-11-16 ENCOUNTER — Encounter: Payer: Self-pay | Admitting: Pulmonary Disease

## 2021-11-16 ENCOUNTER — Other Ambulatory Visit: Payer: Self-pay

## 2021-11-16 ENCOUNTER — Ambulatory Visit (INDEPENDENT_AMBULATORY_CARE_PROVIDER_SITE_OTHER): Payer: Medicaid Other | Admitting: Pulmonary Disease

## 2021-11-16 DIAGNOSIS — J4550 Severe persistent asthma, uncomplicated: Secondary | ICD-10-CM | POA: Diagnosis not present

## 2021-11-16 MED ORDER — SPIRIVA RESPIMAT 1.25 MCG/ACT IN AERS: INHALATION_SPRAY | RESPIRATORY_TRACT | 3 refills | Status: DC

## 2021-11-16 MED ORDER — ALBUTEROL SULFATE HFA 108 (90 BASE) MCG/ACT IN AERS: 2.0000 | INHALATION_SPRAY | Freq: Four times a day (QID) | RESPIRATORY_TRACT | 6 refills | Status: DC | PRN

## 2021-11-16 MED ORDER — DULERA 200-5 MCG/ACT IN AERO: 2.0000 | INHALATION_SPRAY | Freq: Two times a day (BID) | RESPIRATORY_TRACT | 3 refills | Status: DC

## 2021-11-16 MED ORDER — MONTELUKAST SODIUM 10 MG PO TABS: 10.0000 mg | ORAL_TABLET | Freq: Every day | ORAL | 3 refills | Status: DC

## 2021-11-16 NOTE — Progress Notes (Signed)
Subjective:   PATIENT ID: Raven Turner GENDER: female DOB: 06-12-1971, MRN: [id number]   HPI  Chief Complaint  Patient presents with   Follow-up    asthma    Reason for Visit: Follow-up   [name] is a 51 year-old Spanish speaking female never smoker who presents follow-up of severe persistent asthma. Translator is present  Synopsis: For management of her asthma she is on Dupixent and [place] and Spiriva. When she lived in [place], she stated she "lived" in the hospital. Last hospitalization in 2015. No further hospitalization since starting Dupixent. Weaned off of chronic steroids  04/23/21 Overall she feels she is doing well. She has more good days than bad days. Heat will worsen her shortness of breath. She has had to use her albuterol more frequently due to shortness of breath related to the weather three weeks ago but doing better this week, using albuterol only twice. Denies cough, wheezing or recent illness. Currently on home Dupixent and states she rotate sites.  11/16/21 Since our last visit she reports that overall she is well-controlled on Dupixent. She is compliant with her Spiriva and Dulera except when she ran out of refills and had to be without for awhile. She usually needs albuterol once a week but in the last weekend had to use the nebulizer and handheld multiple times.   She has recently seen [facility] for neck pain per patient and was prescribed steroids. She is requesting advice if this is ok to take.  Asthma Control Test ACT Total Score  11/16/2021 17  04/23/2021 20  07/10/2020 17    Past Medical History:  Diagnosis Date   Anemia    Asthma    Bilateral hand numbness 06/25/2015   Bone pain 02/27/2015   Diffuse bone pain in legs mostly but also in arms     Breast pain, left 03/27/2015   Callus of heel 12/16/2014   Compression fracture    Compression fracture of L1 lumbar vertebra ([place]) 08/26/2014   Compression  fracture of T12 vertebra ([place]) 08/28/2014   COPD (chronic obstructive pulmonary disease) ([place]) 2013   Cushing syndrome ([place]) 2013    Depression    Diabetes ([place]) 2013    Diabetes mellitus type 2, uncontrolled 08/26/2014   Sees Dr. Katy Fitch.  Last visit 11/27/14.  No diabetic retinopathy     Diabetes mellitus with neuropathy (HCC)    Diastolic dysfunction without heart failure 10/14/2014   Grade 2 diastolic dysfunction 2/2 pulm HTN    Generalized anxiety disorder 03/27/2015   HTN (hypertension) 2005    Hx of cataract surgery 11/03/2014   LUQ abdominal pain 07/17/2015   Medication management 05/12/2015   Morbid obesity ([place]) 09/08/2014   MRSA colonization 12/15/2014   Non-English speaking patient 09/08/2014   OSA (obstructive sleep apnea) 05/20/2015   Osteopenia 11/19/2013   Dx with osteopenia in lumbar vertebra with partial compression of L1   Pain due to onychomycosis of toenail 03/27/2015   Secondary Cushing's syndrome/ iatrogenic    Skin rash 07/17/2015   Tachycardia 01/13/2015   Upper airway cough syndrome 09/05/2014   Followed in Pulmonary clinic/ [facility]/ [place]  - Trial off advair      09/05/2014 >>>  - trial off theoph 09/17/2014 and added flutter  - rec reduce dulera to 100 2 bid 07/13/2015      Vision changes 06/25/2015   Vitamin D deficiency 08/28/2014     Allergies  Allergen Reactions   Hydrocortisone  Rash and Hives   Shellfish Allergy Anaphylaxis and Hives   Sulfamethoxazole-Trimethoprim Rash, Other (See Comments) and Itching    Facial redness with pruritus  Facial redness with pruritus      Outpatient Medications Prior to Visit  Medication Sig Dispense Refill   albuterol (VENTOLIN HFA) 108 (90 Base) MCG/ACT inhaler Inhale 2 puffs into the lungs every 6 (six) hours as needed for wheezing or shortness of breath. 8 g 6   albuterol (VENTOLIN HFA) 108 (90 Base) MCG/ACT inhaler Inhale 2 puffs into the lungs every 4 (four) hours as needed for wheezing or shortness of breath  (((PLAN B))). 18 g 6   aspirin EC 81 MG tablet Take 1 tablet (81 mg total) by mouth daily. 90 tablet 3   BD INSULIN SYRINGE U-500 31G X 6MM 0.5 ML MISC U UTD TID SUBCUTANEOUSLY     Blood Glucose Monitoring Suppl (ACCU-CHEK AVIVA PLUS) W/DEVICE KIT Used as directed 1 kit 0   Buprenorphine HCl (BELBUCA) 450 MCG FILM Place inside cheek.     cabergoline (DOSTINEX) 0.5 MG tablet Take 0.5 tablets (0.25 mg total) by mouth 2 (two) times a week. 10 tablet 3   calcium carbonate (OS-CAL) 600 MG TABS tablet Take 1 tablet by mouth daily.     cholecalciferol (VITAMIN D) 1000 UNITS tablet Take 1,000 Units by mouth every morning.     cloNIDine (CATAPRES) 0.2 MG tablet Take 1 tablet (0.2 mg total) by mouth 2 (two) times daily. 180 tablet 3   dexlansoprazole (DEXILANT) 60 MG capsule Take 60 mg by mouth daily.     dupilumab (DUPIXENT) 300 MG/2ML prefilled syringe Inject 300 mg into the skin every 14 (fourteen) days. 12 mL 1   folic acid (FOLVITE) 400 MCG tablet Take 400 mcg by mouth daily.     furosemide (LASIX) 20 MG tablet Take 1 tablet (20 mg total) by mouth every morning. 30 tablet 2   gabapentin (NEURONTIN) 400 MG capsule Take 2 capsules (800 mg total) by mouth 3 (three) times daily. 180 capsule 3   glucose blood (RELION GLUCOSE TEST STRIPS) test strip 1 each by Other route 4 (four) times daily. ICD code E11.65 400 each 3   hydrOXYzine (ATARAX/VISTARIL) 25 MG tablet Take 25 mg by mouth at bedtime as needed.     insulin aspart (NOVOLOG) 100 UNIT/ML FlexPen Inject 12 Units into the skin 3 (three) times daily.     Lancet Devices (ACCU-CHEK SOFTCLIX) lancets Use as instructed 1 each 0   levothyroxine (SYNTHROID, LEVOTHROID) 75 MCG tablet Take 112 mcg by mouth daily before breakfast.      linaclotide (LINZESS) 145 MCG CAPS capsule Take 145 mcg by mouth daily before breakfast.     losartan (COZAAR) 100 MG tablet Take 1 tablet (100 mg total) by mouth every morning. 30 tablet 6   methocarbamol (ROBAXIN) 750 MG tablet  Take 1 tablet (750 mg total) by mouth 3 (three) times daily. MUST MAKE APPT FOR FURTHER REFILLS 60 tablet 0   methotrexate (RHEUMATREX) 2.5 MG tablet Take 2.5 mg by mouth once a week. 8 pills wkly .Caution:Chemotherapy. Protect from light.      metoprolol tartrate (LOPRESSOR) 100 MG tablet TAKE 1 TAB AN HOUR BEFORE CT 1 tablet 0   Misc. Devices (CANE) MISC 1 each by Does not apply route daily. 1 each 0   montelukast (SINGULAIR) 10 MG tablet Take 1 tablet (10 mg total) by mouth at bedtime. 90 tablet 4   PARoxetine (PAXIL) 20 MG tablet  Take 1 tablet (20 mg total) by mouth daily. MUST MAKE APPT FOR FURTHER REFILLS 30 tablet 1   promethazine (PHENERGAN) 25 MG tablet Take 1 tablet (25 mg total) by mouth every 6 (six) hours as needed for nausea or vomiting. 15 tablet 0   rosuvastatin (CRESTOR) 20 MG tablet Take 1 tablet (20 mg total) by mouth daily. 90 tablet 3   SPIRIVA RESPIMAT 1.25 MCG/ACT AERS INHALE 2 SPRAY(S) BY MOUTH ONCE DAILY 4 g 0   spironolactone (ALDACTONE) 25 MG tablet Take 25 mg by mouth daily.     terbinafine (LAMISIL) 250 MG tablet Take 1 tablet (250 mg total) by mouth daily. 90 tablet 0   TOUJEO MAX SOLOSTAR 300 UNIT/ML Solostar Pen SMARTSIG:60 Unit(s) SUB-Q Every Night     traMADol (ULTRAM) 50 MG tablet Take by mouth every 6 (six) hours as needed.     traZODone (DESYREL) 50 MG tablet Take 50 mg by mouth at bedtime.     verapamil (CALAN-SR) 180 MG CR tablet Take 2 tablets (360 mg total) by mouth at bedtime. 180 tablet 2   mometasone-formoterol (DULERA) 200-5 MCG/ACT AERO Inhale 2 puffs into the lungs 2 (two) times daily. 3 each 4   nitroGLYCERIN (NITROSTAT) 0.4 MG SL tablet Place 1 tablet (0.4 mg total) under the tongue every 5 (five) minutes as needed. 25 tablet 3   Facility-Administered Medications Prior to Visit  Medication Dose Route Frequency Provider Last Rate Last Admin   betamethasone acetate-betamethasone sodium phosphate (CELESTONE) injection 3 mg  3 mg Intramuscular Once  Daylene Katayama M, DPM       DOBUTamine (DOBUTREX) 1,000 mcg/mL in dextrose 5% 250 mL infusion  30 mcg/kg/min Intravenous Continuous Croitoru, Mihai, MD 145.3 mL/hr at 04/22/15 1300 30 mcg/kg/min at 04/22/15 1300    Review of Systems  Constitutional:  Negative for chills, diaphoresis, fever, malaise/fatigue and weight loss.  HENT:  Negative for congestion.   Respiratory:  Negative for cough, hemoptysis, sputum production, shortness of breath and wheezing.   Cardiovascular:  Negative for chest pain, palpitations and leg swelling.    Objective:   Vitals:   11/16/21 1545  BP: 122/70  Pulse: 89  SpO2: 97%  Weight: 189 lb 12.8 oz (86.1 kg)  Height: 4' 10"  (1.473 m)   SpO2: 97 % O2 Device: None (Room air)  Physical Exam: General: Well-appearing, no acute distress HENT: [place], AT Eyes: EOMI, no scleral icterus Respiratory: Clear to auscultation bilaterally.  No crackles, wheezing or rales Cardiovascular: RRR, -M/R/G, no JVD Extremities:-Edema,-tenderness Neuro: AAO x4, CNII-XII grossly intact Psych: Normal mood, normal affect  Data Reviewed:  Imaging: CT Chest 11/25/18 - Mild air trapping. Normal parenchyma  PFT: 09/02/19 FVC 1.99 (76%) FEV1 1.73 (81%) Ratio 81  TLC 103% DLCO 131%. No significant bronchodilator effect. Interpretation: No obstructive defect or restrictive defect seen on spirometry. Elevated DLCO can be seen in asthma, obesity and large lung volumes.  Labs: Abs eos 03/17/20 - 400    Assessment & Plan:   Discussion: 51 year old female with severe persistent asthma, previously on chronic prednisone who presents for follow-up. Well-controlled on Dupixent. She recently had exacerbation after she ran out of meds in 10/2021. Did not take prednisone. Discussed clinical course and management of asthma including bronchodilator regimen and action plan for exacerbation. Encouraged her to call for early treatment of exacerbations.   Severe persistent asthma - controlled when on  meds REFILL Dulera 200-5 TWO puffs TWICE a day REFILL Spiriva 1.25 mcg TWO puffs ONCE  a day REFILL Albuterol as needed for shortness of breath or wheezing REFILL Singulair ONCE a day CONTINUE Dupixent as scheduled  Asthma Action Plan Increase Dulera to 2 puffs three times a day for worsening shortness of breath, wheezing and cough. If you symptoms do not improve in 24-48 hours, please our office for evaluation and/or prednisone taper.  Health Maintenance Immunization History  Administered Date(s) Administered   Hepatitis B 03/23/2015, 07/06/2015   Hepatitis B, ped/adol 03/23/2015, 07/06/2015   Influenza Split 05/25/2015   Influenza,inj,Quad PF,6+ Mos 05/13/2016, 05/29/2017, 07/13/2018, 06/03/2019, 06/17/2020   Influenza-Unspecified 05/25/2015, 05/13/2016, 05/29/2017, 07/13/2018, 06/03/2019   PFIZER(Purple Top)SARS-COV-2 Vaccination 12/02/2019, 12/23/2019   Pneumococcal Polysaccharide-23 07/31/2017   Pneumococcal-Unspecified 04/12/2012   Tdap 06/24/2015    No orders of the defined types were placed in this encounter.   Meds ordered this encounter  Medications   albuterol (VENTOLIN HFA) 108 (90 Base) MCG/ACT inhaler    Sig: Inhale 2 puffs into the lungs every 6 (six) hours as needed for wheezing or shortness of breath.    Dispense:  8 g    Refill:  6    Please RX proair.   montelukast (SINGULAIR) 10 MG tablet    Sig: Take 1 tablet (10 mg total) by mouth at bedtime.    Dispense:  90 tablet    Refill:  3   Tiotropium Bromide Monohydrate (SPIRIVA RESPIMAT) 1.25 MCG/ACT AERS    Sig: INHALE 2 SPRAY(S) BY MOUTH ONCE DAILY    Dispense:  12 g    Refill:  3   mometasone-formoterol (DULERA) 200-5 MCG/ACT AERO    Sig: Inhale 2 puffs into the lungs 2 (two) times daily.    Dispense:  3 each    Refill:  3    Return in about 6 months (around 05/19/2022).  I have spent a total time of 33-minutes on the day of the appointment reviewing prior documentation, coordinating care and  discussing medical diagnosis and plan with the patient/family. Past medical history, allergies, medications were reviewed. Pertinent imaging, labs and tests included in this note have been reviewed and interpreted independently by me.   [place], MD [facility] 11/16/2021 3:59 PM  Office Number [phone]

## 2021-11-16 NOTE — Patient Instructions (Signed)
Severe persistent asthma - controlled when on meds ?REFILL Dulera 200-5 TWO puffs TWICE a day ?REFILL Spiriva 1.25 mcg TWO puffs ONCE a day ?REFILL Albuterol as needed for shortness of breath or wheezing ?REFILL Singulair ONCE a day ?CONTINUE Dupixent as scheduled ? ?Follow-up with me in 6 months for biologic check ? ? ?

## 2021-11-17 ENCOUNTER — Encounter: Payer: Self-pay | Admitting: Pulmonary Disease

## 2021-11-18 MED ORDER — DUPIXENT 300 MG/2ML ~~LOC~~ SOSY: 300.0000 mg | PREFILLED_SYRINGE | SUBCUTANEOUS | 1 refills | Status: DC

## 2021-11-18 NOTE — Telephone Encounter (Signed)
Refill for Dupixent sent to [facility]. ? ?Dose: 32m SQ every 14 days ?Last seen by Dr. ELoanne Drillingon 11/16/21 w plan to continue Dupixent as prescribed ? ?DKnox Saliva PharmD, MPH, BCPS ?Clinical Pharmacist (Rheumatology and Pulmonology) ?

## 2022-01-27 ENCOUNTER — Other Ambulatory Visit: Payer: Self-pay

## 2022-01-27 ENCOUNTER — Inpatient Hospital Stay: Payer: Medicaid Other | Attending: Hematology and Oncology

## 2022-01-27 ENCOUNTER — Inpatient Hospital Stay (HOSPITAL_BASED_OUTPATIENT_CLINIC_OR_DEPARTMENT_OTHER): Payer: Medicaid Other | Admitting: Hematology and Oncology

## 2022-01-27 ENCOUNTER — Inpatient Hospital Stay: Payer: Medicaid Other

## 2022-01-27 DIAGNOSIS — J449 Chronic obstructive pulmonary disease, unspecified: Secondary | ICD-10-CM | POA: Insufficient documentation

## 2022-01-27 DIAGNOSIS — D500 Iron deficiency anemia secondary to blood loss (chronic): Secondary | ICD-10-CM

## 2022-01-27 DIAGNOSIS — Z7982 Long term (current) use of aspirin: Secondary | ICD-10-CM | POA: Diagnosis not present

## 2022-01-27 DIAGNOSIS — Z7952 Long term (current) use of systemic steroids: Secondary | ICD-10-CM | POA: Insufficient documentation

## 2022-01-27 DIAGNOSIS — Z7951 Long term (current) use of inhaled steroids: Secondary | ICD-10-CM | POA: Diagnosis not present

## 2022-01-27 DIAGNOSIS — K5900 Constipation, unspecified: Secondary | ICD-10-CM | POA: Diagnosis not present

## 2022-01-27 DIAGNOSIS — D509 Iron deficiency anemia, unspecified: Secondary | ICD-10-CM | POA: Insufficient documentation

## 2022-01-27 DIAGNOSIS — Z79899 Other long term (current) drug therapy: Secondary | ICD-10-CM | POA: Insufficient documentation

## 2022-01-27 LAB — CBC WITH DIFFERENTIAL/PLATELET
Abs Immature Granulocytes: 0.05 10*3/uL (ref 0.00–0.07)
Basophils Absolute: 0.1 10*3/uL (ref 0.0–0.1)
Basophils Relative: 2 %
Eosinophils Absolute: 0.3 10*3/uL (ref 0.0–0.5)
Eosinophils Relative: 5 %
HCT: 38.5 % (ref 36.0–46.0)
Hemoglobin: 12.9 g/dL (ref 12.0–15.0)
Immature Granulocytes: 1 %
Lymphocytes Relative: 28 %
Lymphs Abs: 2.1 10*3/uL (ref 0.7–4.0)
MCH: 29.6 pg (ref 26.0–34.0)
MCHC: 33.5 g/dL (ref 30.0–36.0)
MCV: 88.3 fL (ref 80.0–100.0)
Monocytes Absolute: 0.9 10*3/uL (ref 0.1–1.0)
Monocytes Relative: 12 %
Neutro Abs: 4 10*3/uL (ref 1.7–7.7)
Neutrophils Relative %: 52 %
Platelets: 293 10*3/uL (ref 150–400)
RBC: 4.36 MIL/uL (ref 3.87–5.11)
RDW: 13.9 % (ref 11.5–15.5)
WBC: 7.5 10*3/uL (ref 4.0–10.5)
nRBC: 0 % (ref 0.0–0.2)

## 2022-01-27 LAB — FERRITIN: Ferritin: 190 ng/mL (ref 11–307)

## 2022-01-28 ENCOUNTER — Encounter: Payer: Self-pay | Admitting: Hematology and Oncology

## 2022-01-28 NOTE — Assessment & Plan Note (Signed)
Her last intravenous iron infusion was more than 6 months ago She is no longer anemic Iron studies are adequate I shared with the patient, it is hard to predict when she would need intravenous iron infusion again As the patient is being seen on a regular basis by her primary care doctor every 3 months, I recommend just close observation with her primary care doctor If she needs recurrent IV iron again, the patient knows how to reach out to me for repeat treatment

## 2022-01-28 NOTE — Progress Notes (Signed)
[facility] OFFICE PROGRESS NOTE  [name], NP  ASSESSMENT & PLAN:  Iron deficiency anemia Her last intravenous iron infusion was more than 6 months ago She is no longer anemic Iron studies are adequate I shared with the patient, it is hard to predict when she would need intravenous iron infusion again As the patient is being seen on a regular basis by her primary care doctor every 3 months, I recommend just close observation with her primary care doctor If she needs recurrent IV iron again, the patient knows how to reach out to me for repeat treatment  No orders of the defined types were placed in this encounter.   The total time spent in the appointment was 20 minutes encounter with patients including review of chart and various tests results, discussions about plan of care and coordination of care plan   All questions were answered. The patient knows to call the clinic with any problems, questions or concerns. No barriers to learning was detected.    Heath Lark, MD 5/19/20238:52 AM  INTERVAL HISTORY: Raven Turner 51 y.o. female returns for return visit for history of iron deficiency anemia She feels fine No recent bleeding Her energy level is fair  SUMMARY OF HEMATOLOGIC HISTORY: Raven Turner  Was transferred to my care after her prior physician has left.  Interpreter is present throughout the entire visit I reviewed the patient's records extensive and collaborated the history with the patient. Summary of her history is as follows: This patient has symptomatic frequent multiple medical comorbidities She is steroid-dependent, prescribed for chronic COPD/asthma management She has significant weight gain and is currently using insulin therapy She was seen here repeatedly by different hematologist for iron deficiency anemia secondary to menorrhagia She has received intermittent intravenous iron infusion chronically She complains of  intermittent fatigue She has chronic menorrhagia, with menstruation, lasting 6 to 8 days of her cycle of every 30 days, described as heavy She tolerated intravenous iron well She did not tolerate oral iron supplement causing constipation and GI distress The patient denies any recent signs or symptoms of bleeding such as spontaneous epistaxis, hematuria or hematochezia. She received several doses of intravenous iron in 2021 and last infusion in October 2022  I have reviewed the past medical history, past surgical history, social history and family history with the patient and they are unchanged from previous note.  ALLERGIES:  is allergic to hydrocortisone, shellfish allergy, and sulfamethoxazole-trimethoprim.  MEDICATIONS:  Current Outpatient Medications  Medication Sig Dispense Refill   albuterol (VENTOLIN HFA) 108 (90 Base) MCG/ACT inhaler Inhale 2 puffs into the lungs every 6 (six) hours as needed for wheezing or shortness of breath. 8 g 6   aspirin EC 81 MG tablet Take 1 tablet (81 mg total) by mouth daily. 90 tablet 3   BD INSULIN SYRINGE U-500 31G X 6MM 0.5 ML MISC U UTD TID SUBCUTANEOUSLY     Blood Glucose Monitoring Suppl (ACCU-CHEK AVIVA PLUS) W/DEVICE KIT Used as directed 1 kit 0   Buprenorphine HCl (BELBUCA) 450 MCG FILM Place inside cheek.     cabergoline (DOSTINEX) 0.5 MG tablet Take 0.5 tablets (0.25 mg total) by mouth 2 (two) times a week. 10 tablet 3   calcium carbonate (OS-CAL) 600 MG TABS tablet Take 1 tablet by mouth daily.     cholecalciferol (VITAMIN D) 1000 UNITS tablet Take 1,000 Units by mouth every morning.     cloNIDine (CATAPRES) 0.2 MG tablet Take 1 tablet (  0.2 mg total) by mouth 2 (two) times daily. 180 tablet 3   dexlansoprazole (DEXILANT) 60 MG capsule Take 60 mg by mouth daily.     dupilumab (DUPIXENT) 300 MG/2ML prefilled syringe Inject 300 mg into the skin every 14 (fourteen) days. 12 mL 1   folic acid (FOLVITE) 003 MCG tablet Take 400 mcg by mouth daily.      furosemide (LASIX) 20 MG tablet Take 1 tablet (20 mg total) by mouth every morning. 30 tablet 2   gabapentin (NEURONTIN) 400 MG capsule Take 2 capsules (800 mg total) by mouth 3 (three) times daily. 180 capsule 3   glucose blood (RELION GLUCOSE TEST STRIPS) test strip 1 each by Other route 4 (four) times daily. ICD code E11.65 400 each 3   hydrOXYzine (ATARAX/VISTARIL) 25 MG tablet Take 25 mg by mouth at bedtime as needed.     insulin aspart (NOVOLOG) 100 UNIT/ML FlexPen Inject 12 Units into the skin 3 (three) times daily.     Lancet Devices (ACCU-CHEK SOFTCLIX) lancets Use as instructed 1 each 0   levothyroxine (SYNTHROID, LEVOTHROID) 75 MCG tablet Take 112 mcg by mouth daily before breakfast.      linaclotide (LINZESS) 145 MCG CAPS capsule Take 145 mcg by mouth daily before breakfast.     losartan (COZAAR) 100 MG tablet Take 1 tablet (100 mg total) by mouth every morning. 30 tablet 6   methocarbamol (ROBAXIN) 750 MG tablet Take 1 tablet (750 mg total) by mouth 3 (three) times daily. MUST MAKE APPT FOR FURTHER REFILLS 60 tablet 0   methotrexate (RHEUMATREX) 2.5 MG tablet Take 2.5 mg by mouth once a week. 8 pills wkly .Caution:Chemotherapy. Protect from light.      metoprolol tartrate (LOPRESSOR) 100 MG tablet TAKE 1 TAB AN HOUR BEFORE CT 1 tablet 0   Misc. Devices (CANE) MISC 1 each by Does not apply route daily. 1 each 0   mometasone-formoterol (DULERA) 200-5 MCG/ACT AERO Inhale 2 puffs into the lungs 2 (two) times daily. 3 each 3   montelukast (SINGULAIR) 10 MG tablet Take 1 tablet (10 mg total) by mouth at bedtime. 90 tablet 3   nitroGLYCERIN (NITROSTAT) 0.4 MG SL tablet Place 1 tablet (0.4 mg total) under the tongue every 5 (five) minutes as needed. 25 tablet 3   PARoxetine (PAXIL) 20 MG tablet Take 1 tablet (20 mg total) by mouth daily. MUST MAKE APPT FOR FURTHER REFILLS 30 tablet 1   promethazine (PHENERGAN) 25 MG tablet Take 1 tablet (25 mg total) by mouth every 6 (six) hours as needed  for nausea or vomiting. 15 tablet 0   rosuvastatin (CRESTOR) 20 MG tablet Take 1 tablet (20 mg total) by mouth daily. 90 tablet 3   spironolactone (ALDACTONE) 25 MG tablet Take 25 mg by mouth daily.     terbinafine (LAMISIL) 250 MG tablet Take 1 tablet (250 mg total) by mouth daily. 90 tablet 0   Tiotropium Bromide Monohydrate (SPIRIVA RESPIMAT) 1.25 MCG/ACT AERS INHALE 2 SPRAY(S) BY MOUTH ONCE DAILY 12 g 3   TOUJEO MAX SOLOSTAR 300 UNIT/ML Solostar Pen SMARTSIG:60 Unit(s) SUB-Q Every Night     traMADol (ULTRAM) 50 MG tablet Take by mouth every 6 (six) hours as needed.     traZODone (DESYREL) 50 MG tablet Take 50 mg by mouth at bedtime.     verapamil (CALAN-SR) 180 MG CR tablet Take 2 tablets (360 mg total) by mouth at bedtime. 180 tablet 2   Current Facility-Administered Medications  Medication Dose  Route Frequency Provider Last Rate Last Admin   betamethasone acetate-betamethasone sodium phosphate (CELESTONE) injection 3 mg  3 mg Intramuscular Once Edrick Kins, DPM       Facility-Administered Medications Ordered in Other Visits  Medication Dose Route Frequency Provider Last Rate Last Admin   DOBUTamine (DOBUTREX) 1,000 mcg/mL in dextrose 5% 250 mL infusion  30 mcg/kg/min Intravenous Continuous Croitoru, Mihai, MD 145.3 mL/hr at 04/22/15 1300 30 mcg/kg/min at 04/22/15 1300     REVIEW OF SYSTEMS:   Constitutional: Denies fevers, chills or night sweats Eyes: Denies blurriness of vision Ears, nose, mouth, throat, and face: Denies mucositis or sore throat Respiratory: Denies cough, dyspnea or wheezes Cardiovascular: Denies palpitation, chest discomfort or lower extremity swelling Gastrointestinal:  Denies nausea, heartburn or change in bowel habits Skin: Denies abnormal skin rashes Lymphatics: Denies new lymphadenopathy or easy bruising Neurological:Denies numbness, tingling or new weaknesses Behavioral/Psych: Mood is stable, no new changes  All other systems were reviewed with the  patient and are negative.  PHYSICAL EXAMINATION: ECOG PERFORMANCE STATUS: 1 - Symptomatic but completely ambulatory  Vitals:   01/27/22 1137  BP: 123/64  Pulse: 93  Resp: 18  Temp: 100.1 F (37.8 C)  SpO2: 98%   Filed Weights   01/27/22 1137  Weight: 188 lb (85.3 kg)    GENERAL:alert, no distress and comfortable NEURO: alert & oriented x 3 with fluent speech, no focal motor/sensory deficits  LABORATORY DATA:  I have reviewed the data as listed     Component Value Date/Time   NA 139 09/11/2021 1901   NA 140 07/27/2021 1232   K 4.4 09/11/2021 1901   CL 103 09/11/2021 1901   CO2 29 09/11/2021 1901   GLUCOSE 80 09/11/2021 1901   BUN 14 09/11/2021 1901   BUN 13 07/27/2021 1232   CREATININE 0.84 09/11/2021 1901   CREATININE 1.09 (H) 07/23/2018 1026   CREATININE 0.84 11/07/2016 1641   CALCIUM 9.6 09/11/2021 1901   PROT 8.0 09/11/2021 1901   PROT 7.1 04/06/2020 1410   ALBUMIN 4.1 09/11/2021 1901   ALBUMIN 4.2 04/06/2020 1410   AST 22 09/11/2021 1901   ALT 23 09/11/2021 1901   ALKPHOS 77 09/11/2021 1901   BILITOT 0.6 09/11/2021 1901   BILITOT <0.2 04/06/2020 1410   GFRNONAA >60 09/11/2021 1901   GFRNONAA 60 (L) 07/23/2018 1026   GFRNONAA 85 11/07/2016 1641   GFRAA 100 04/06/2020 1410   GFRAA >60 07/23/2018 1026   GFRAA >89 11/07/2016 1641    No results found for: SPEP, UPEP  Lab Results  Component Value Date   WBC 7.5 01/27/2022   NEUTROABS 4.0 01/27/2022   HGB 12.9 01/27/2022   HCT 38.5 01/27/2022   MCV 88.3 01/27/2022   PLT 293 01/27/2022      Chemistry      Component Value Date/Time   NA 139 09/11/2021 1901   NA 140 07/27/2021 1232   K 4.4 09/11/2021 1901   CL 103 09/11/2021 1901   CO2 29 09/11/2021 1901   BUN 14 09/11/2021 1901   BUN 13 07/27/2021 1232   CREATININE 0.84 09/11/2021 1901   CREATININE 1.09 (H) 07/23/2018 1026   CREATININE 0.84 11/07/2016 1641      Component Value Date/Time   CALCIUM 9.6 09/11/2021 1901   ALKPHOS 77  09/11/2021 1901   AST 22 09/11/2021 1901   ALT 23 09/11/2021 1901   BILITOT 0.6 09/11/2021 1901   BILITOT <0.2 04/06/2020 1410

## 2022-02-21 ENCOUNTER — Telehealth: Payer: Self-pay

## 2022-02-21 ENCOUNTER — Other Ambulatory Visit (HOSPITAL_COMMUNITY): Payer: Self-pay

## 2022-02-21 NOTE — Telephone Encounter (Signed)
Received notification from [facility] regarding a prior authorization for [place]. Authorization has been APPROVED from 02/21/2022 to 08/23/2022. Approval letter sent to scan center. Approval letter also faxed to [place].  Authorization # [id number]

## 2022-02-21 NOTE — Telephone Encounter (Signed)
Received fax from [place] stating that a new PA is required.  Submitted a Prior Authorization request to [facility] for [place] via CoverMyMeds. Will update once we receive a response.   Key: [id number]   *Note* Pt's insurance plan does not populate in eligibility checks, request has been submitted using information submitted last year.

## 2022-03-14 ENCOUNTER — Other Ambulatory Visit: Payer: Self-pay | Admitting: Nurse Practitioner

## 2022-03-14 DIAGNOSIS — N63 Unspecified lump in unspecified breast: Secondary | ICD-10-CM

## 2022-04-07 ENCOUNTER — Other Ambulatory Visit: Payer: Medicaid Other

## 2022-04-13 ENCOUNTER — Other Ambulatory Visit: Payer: Medicaid Other

## 2022-04-20 ENCOUNTER — Encounter (INDEPENDENT_AMBULATORY_CARE_PROVIDER_SITE_OTHER): Payer: Self-pay

## 2022-05-18 ENCOUNTER — Ambulatory Visit
Admission: RE | Admit: 2022-05-18 | Discharge: 2022-05-18 | Disposition: A | Discharge status: Home / Self Care | Payer: Medicaid Other | Source: Ambulatory Visit | Attending: Nurse Practitioner | Admitting: Nurse Practitioner

## 2022-05-18 DIAGNOSIS — N63 Unspecified lump in unspecified breast: Secondary | ICD-10-CM

## 2022-06-17 ENCOUNTER — Telehealth: Payer: Self-pay | Admitting: Pulmonary Disease

## 2022-06-17 NOTE — Telephone Encounter (Signed)
Met pt in lobby were she c/o increased fatigue and congestion. Pt states she was seen by her PCP who had sent her in a prednisone taper. Pt said she was advised to be seen here in office as well. Pt was offered an acute OV with Dr. Wert but declined and scheduled OV next week for f/u. Pt advised if she develops increased SOB, congestion or trouble breathing she should go to UC or ED. Pt stated understanding. Nothing further needed at this time.   

## 2022-06-28 ENCOUNTER — Ambulatory Visit (INDEPENDENT_AMBULATORY_CARE_PROVIDER_SITE_OTHER): Payer: Medicaid Other

## 2022-06-28 ENCOUNTER — Encounter: Payer: Self-pay | Admitting: Nurse Practitioner

## 2022-06-28 ENCOUNTER — Ambulatory Visit (INDEPENDENT_AMBULATORY_CARE_PROVIDER_SITE_OTHER): Payer: Medicaid Other | Admitting: Nurse Practitioner

## 2022-06-28 VITALS — BP 126/70 | HR 87 | Ht <= 58 in | Wt 192.6 lb

## 2022-06-28 DIAGNOSIS — J4551 Severe persistent asthma with (acute) exacerbation: Secondary | ICD-10-CM

## 2022-06-28 DIAGNOSIS — J189 Pneumonia, unspecified organism: Secondary | ICD-10-CM | POA: Diagnosis not present

## 2022-06-28 LAB — POCT EXHALED NITRIC OXIDE: FeNO level (ppb): 39

## 2022-06-28 MED ORDER — IPRATROPIUM-ALBUTEROL 0.5-2.5 (3) MG/3ML IN SOLN
3.0000 mL | Freq: Four times a day (QID) | RESPIRATORY_TRACT | Status: AC
  Administered 2022-06-28: 3 mL via RESPIRATORY_TRACT

## 2022-06-28 MED ORDER — PREDNISONE 10 MG PO TABS: ORAL_TABLET | ORAL | 0 refills | Status: DC

## 2022-06-28 MED ORDER — BENZONATATE 200 MG PO CAPS: 200.0000 mg | ORAL_CAPSULE | Freq: Three times a day (TID) | ORAL | 1 refills | Status: DC | PRN

## 2022-06-28 MED ORDER — METHYLPREDNISOLONE ACETATE 80 MG/ML IJ SUSP
80.0000 mg | Freq: Once | INTRAMUSCULAR | Status: AC
  Administered 2022-06-28: 80 mg via INTRAMUSCULAR

## 2022-06-28 MED ORDER — ALBUTEROL SULFATE (2.5 MG/3ML) 0.083% IN NEBU: 2.5000 mg | INHALATION_SOLUTION | Freq: Four times a day (QID) | RESPIRATORY_TRACT | 5 refills | Status: DC | PRN

## 2022-06-28 MED ORDER — AMOXICILLIN-POT CLAVULANATE 875-125 MG PO TABS: 1.0000 | ORAL_TABLET | Freq: Two times a day (BID) | ORAL | 0 refills | Status: DC

## 2022-06-28 NOTE — Assessment & Plan Note (Signed)
Streaky opacities in the mid to basilar lungs, concerning for pneumonia given her worsening symptoms.  Vital signs stable and nontoxic-appearing.  We will treat her with Augmentin 7-day course.  Target mucociliary clearance therapies.  Close follow-up.

## 2022-06-28 NOTE — Progress Notes (Signed)
@Patient  ID: Raven Turner, female    DOB: January 06, 1971, 51 y.o.   MRN: [id number]  Chief Complaint  Patient presents with   Follow-up    Referring provider: [name], NP  HPI: 51 year old female, never smoker followed for severe persistent asthma on Dupixent.  She is a patient of Dr. Cordelia Pen and last seen in office on 11/16/2021.  Past medical history significant for hypertension, OSA, NASH, Cushing syndrome, hypothyroid, DM 2, rheumatoid arthritis on methotrexate, CKD stage II, morbid obesity, IDA, vitamin D deficiency, anxiety, chronic back pain, diastolic dysfunction.  TEST/EVENTS:  11/24/2017 HRCT chest: No evidence of ILD.  There is mild air trapping.  Atherosclerosis is present.  There is also hepatic steatosis. 02/01/2018 PFT: FVC 78, FEV1 78, ratio 87, TLC 103, DLCOunc 131.  No significant bronchodilator response in FEV1.  Did have mid flow reversibility.  11/16/2021: OV with Dr. Loanne Drilling.  Doing well since last visit.  Overall well controlled on Dupixent.  Compliant with Spiriva and Dulera.  Usually only needs albuterol once a week.  Discussed asthma action plan.  Advised her to increase Dulera to 2 puffs 3 times a day for worsening shortness of breath, cough or wheezing.  If no improvement in 24 to 48 hours, please contact our office for evaluation/taper  06/28/2022: Today - acute Patient presents today for acute visit.  She had contacted the office on 10/6 after being seen by her PCP and treated for acute asthma exacerbation with prednisone and Z-Pak.  She was initially feeling better.  Today, she tells me that since she came off the steroids and antibiotic, she started to feel worse again.  She has a persistent cough which is congested.  She feels like she has to get sputum up but has not been able to produce any.  She has a lot of chest tightness and wheezing.  She also feels more short of breath, especially with coughing spells.  She does have some PND at night.  Denies  any fevers, chills, hemoptysis, URI symptoms, recent sick exposures, increased lower extremity swelling.  She is on [place].  Receives Dupixent injections; last administration was 10/16.  Takes Singulair for trigger prevention. Eating and drinking well. Feeling fatigued   Allergies  Allergen Reactions   Hydrocortisone Rash and Hives   Shellfish Allergy Anaphylaxis and Hives   Sulfamethoxazole-Trimethoprim Rash, Other (See Comments) and Itching    Facial redness with pruritus  Facial redness with pruritus     Immunization History  Administered Date(s) Administered   Hepatitis B 03/23/2015, 07/06/2015   Hepatitis B, PED/ADOLESCENT 03/23/2015, 07/06/2015   Influenza Split 05/25/2015   Influenza,inj,Quad PF,6+ Mos 05/13/2016, 05/29/2017, 07/13/2018, 06/03/2019, 06/17/2020   Influenza-Unspecified 05/25/2015, 05/13/2016, 05/29/2017, 07/13/2018, 06/03/2019   PFIZER(Purple Top)SARS-COV-2 Vaccination 12/02/2019, 12/23/2019   Pneumococcal Polysaccharide-23 07/31/2017   Pneumococcal-Unspecified 04/12/2012   Tdap 06/24/2015    Past Medical History:  Diagnosis Date   Anemia    Asthma    Bilateral hand numbness 06/25/2015   Bone pain 02/27/2015   Diffuse bone pain in legs mostly but also in arms     Breast pain, left 03/27/2015   Callus of heel 12/16/2014   Compression fracture    Compression fracture of L1 lumbar vertebra ([place]) 08/26/2014   Compression fracture of T12 vertebra ([place]) 08/28/2014   COPD (chronic obstructive pulmonary disease) ([place]) 2013   Cushing syndrome ([place]) 2013    Depression    Diabetes ([place]) 2013    Diabetes mellitus type 2, uncontrolled 08/26/2014  Sees Dr. Katy Fitch.  Last visit 11/27/14.  No diabetic retinopathy     Diabetes mellitus with neuropathy ([facility])    Diastolic dysfunction without heart failure 10/14/2014   Grade 2 diastolic dysfunction 2/2 pulm HTN    Generalized anxiety disorder 03/27/2015   HTN (hypertension) 2005    Hx of cataract surgery 11/03/2014    LUQ abdominal pain 07/17/2015   Medication management 05/12/2015   Morbid obesity ([place]) 09/08/2014   MRSA colonization 12/15/2014   Non-English speaking patient 09/08/2014   OSA (obstructive sleep apnea) 05/20/2015   Osteopenia 11/19/2013   Dx with osteopenia in lumbar vertebra with partial compression of L1   Pain due to onychomycosis of toenail 03/27/2015   Secondary Cushing's syndrome/ iatrogenic    Skin rash 07/17/2015   Tachycardia 01/13/2015   Upper airway cough syndrome 09/05/2014   Followed in Pulmonary clinic/ [facility]/ W[facility]  - Trial off advair      09/05/2014 >>>  - trial off theoph 09/17/2014 and added flutter  - rec reduce dulera to 100 2 bid 07/13/2015      Vision changes 06/25/2015   Vitamin D deficiency 08/28/2014    Tobacco History: Social History   Tobacco Use  Smoking Status Never  Smokeless Tobacco Never   Counseling given: Not Answered   Outpatient Medications Prior to Visit  Medication Sig Dispense Refill   albuterol (VENTOLIN HFA) 108 (90 Base) MCG/ACT inhaler Inhale 2 puffs into the lungs every 6 (six) hours as needed for wheezing or shortness of breath. 8 g 6   aspirin EC 81 MG tablet Take 1 tablet (81 mg total) by mouth daily. 90 tablet 3   BD INSULIN SYRINGE U-500 31G X 6MM 0.5 ML MISC U UTD TID SUBCUTANEOUSLY     Blood Glucose Monitoring Suppl (ACCU-CHEK AVIVA PLUS) W/DEVICE KIT Used as directed 1 kit 0   Buprenorphine HCl (BELBUCA) 450 MCG FILM Place inside cheek.     cabergoline (DOSTINEX) 0.5 MG tablet Take 0.5 tablets (0.25 mg total) by mouth 2 (two) times a week. 10 tablet 3   calcium carbonate (OS-CAL) 600 MG TABS tablet Take 1 tablet by mouth daily.     cholecalciferol (VITAMIN D) 1000 UNITS tablet Take 1,000 Units by mouth every morning.     cloNIDine (CATAPRES) 0.2 MG tablet Take 1 tablet (0.2 mg total) by mouth 2 (two) times daily. 180 tablet 3   dexlansoprazole (DEXILANT) 60 MG capsule Take 60 mg by mouth daily.     dupilumab (DUPIXENT)  300 MG/2ML prefilled syringe Inject 300 mg into the skin every 14 (fourteen) days. 12 mL 1   folic acid (FOLVITE) 751 MCG tablet Take 400 mcg by mouth daily.     furosemide (LASIX) 20 MG tablet Take 1 tablet (20 mg total) by mouth every morning. 30 tablet 2   gabapentin (NEURONTIN) 400 MG capsule Take 2 capsules (800 mg total) by mouth 3 (three) times daily. 180 capsule 3   glucose blood (RELION GLUCOSE TEST STRIPS) test strip 1 each by Other route 4 (four) times daily. ICD code E11.65 400 each 3   hydrOXYzine (ATARAX/VISTARIL) 25 MG tablet Take 25 mg by mouth at bedtime as needed.     insulin aspart (NOVOLOG) 100 UNIT/ML FlexPen Inject 12 Units into the skin 3 (three) times daily.     Lancet Devices (ACCU-CHEK SOFTCLIX) lancets Use as instructed 1 each 0   levothyroxine (SYNTHROID, LEVOTHROID) 75 MCG tablet Take 112 mcg by mouth daily before breakfast.  linaclotide (LINZESS) 145 MCG CAPS capsule Take 145 mcg by mouth daily before breakfast.     losartan (COZAAR) 100 MG tablet Take 1 tablet (100 mg total) by mouth every morning. 30 tablet 6   methocarbamol (ROBAXIN) 750 MG tablet Take 1 tablet (750 mg total) by mouth 3 (three) times daily. MUST MAKE APPT FOR FURTHER REFILLS 60 tablet 0   methotrexate (RHEUMATREX) 2.5 MG tablet Take 2.5 mg by mouth once a week. 8 pills wkly .Caution:Chemotherapy. Protect from light.      metoprolol tartrate (LOPRESSOR) 100 MG tablet TAKE 1 TAB AN HOUR BEFORE CT 1 tablet 0   Misc. Devices (CANE) MISC 1 each by Does not apply route daily. 1 each 0   montelukast (SINGULAIR) 10 MG tablet Take 1 tablet (10 mg total) by mouth at bedtime. 90 tablet 3   PARoxetine (PAXIL) 20 MG tablet Take 1 tablet (20 mg total) by mouth daily. MUST MAKE APPT FOR FURTHER REFILLS 30 tablet 1   promethazine (PHENERGAN) 25 MG tablet Take 1 tablet (25 mg total) by mouth every 6 (six) hours as needed for nausea or vomiting. 15 tablet 0   rosuvastatin (CRESTOR) 20 MG tablet Take 1 tablet (20  mg total) by mouth daily. 90 tablet 3   spironolactone (ALDACTONE) 25 MG tablet Take 25 mg by mouth daily.     terbinafine (LAMISIL) 250 MG tablet Take 1 tablet (250 mg total) by mouth daily. 90 tablet 0   Tiotropium Bromide Monohydrate (SPIRIVA RESPIMAT) 1.25 MCG/ACT AERS INHALE 2 SPRAY(S) BY MOUTH ONCE DAILY 12 g 3   TOUJEO MAX SOLOSTAR 300 UNIT/ML Solostar Pen SMARTSIG:60 Unit(s) SUB-Q Every Night     traMADol (ULTRAM) 50 MG tablet Take by mouth every 6 (six) hours as needed.     traZODone (DESYREL) 50 MG tablet Take 50 mg by mouth at bedtime.     verapamil (CALAN-SR) 180 MG CR tablet Take 2 tablets (360 mg total) by mouth at bedtime. 180 tablet 2   mometasone-formoterol (DULERA) 200-5 MCG/ACT AERO Inhale 2 puffs into the lungs 2 (two) times daily. 3 each 3   nitroGLYCERIN (NITROSTAT) 0.4 MG SL tablet Place 1 tablet (0.4 mg total) under the tongue every 5 (five) minutes as needed. 25 tablet 3   Facility-Administered Medications Prior to Visit  Medication Dose Route Frequency Provider Last Rate Last Admin   betamethasone acetate-betamethasone sodium phosphate (CELESTONE) injection 3 mg  3 mg Intramuscular Once Daylene Katayama M, DPM       DOBUTamine (DOBUTREX) 1,000 mcg/mL in dextrose 5% 250 mL infusion  30 mcg/kg/min Intravenous Continuous Croitoru, Mihai, MD 145.3 mL/hr at 04/22/15 1300 30 mcg/kg/min at 04/22/15 1300     Review of Systems:   Constitutional: No weight loss or gain, night sweats, fevers, chills. +fatigue, lassitude. HEENT: No headaches, difficulty swallowing, tooth/dental problems, or sore throat. No sneezing, itching, ear ache, nasal congestion, or post nasal drip CV:  No chest pain, orthopnea, PND, swelling in lower extremities, anasarca, dizziness, palpitations, syncope Resp: +shortness of breath with exertion; congested cough; chest tightness; wheezing. No hemoptysis. No chest wall deformity GI:  No heartburn, indigestion, abdominal pain, nausea, vomiting, diarrhea, change  in bowel habits, loss of appetite, bloody stools.  GU: No dysuria, change in color of urine, urgency or frequency.  Skin: No rash, lesions, ulcerations MSK:  No joint pain or swelling.  No decreased range of motion.  No back pain. Neuro: No dizziness or lightheadedness.  Psych: No depression or anxiety. Mood stable.  Physical Exam:  BP 126/70 (BP Location: Right Arm, Cuff Size: Normal)   Pulse 87   Ht 4' 10"  (1.473 m)   Wt 192 lb 9.6 oz (87.4 kg)   LMP 07/20/2018   SpO2 96%   BMI 40.25 kg/m   GEN: Pleasant, interactive, well-developed; obese; in no acute distress. HEENT:  Normocephalic and atraumatic. PERRLA. Sclera white. Nasal turbinates pink, moist and patent bilaterally. No rhinorrhea present. Oropharynx pink and moist, without exudate or edema. No lesions, ulcerations, or postnasal drip.  NECK:  Supple w/ fair ROM. No JVD present. Normal carotid impulses w/o bruits. Thyroid symmetrical with no goiter or nodules palpated. No lymphadenopathy.   CV: RRR, no m/r/g, no peripheral edema. Pulses intact, +2 bilaterally. No cyanosis, pallor or clubbing. PULMONARY:  Unlabored, regular breathing. Rhonchi and end expiratory wheezes bilaterally A&P; bronchitic cough. No accessory muscle use.  GI: BS present and normoactive. Soft, non-tender to palpation. No organomegaly or masses detected.  MSK: No erythema, warmth or tenderness. No deformities or joint swelling noted.  Neuro: A/Ox3. No focal deficits noted.   Skin: Warm, no lesions or rashe Psych: Normal affect and behavior. Judgement and thought content appropriate.     Lab Results:  CBC    Component Value Date/Time   WBC 7.5 01/27/2022 1116   RBC 4.36 01/27/2022 1116   HGB 12.9 01/27/2022 1116   HGB 11.0 (L) 09/16/2019 0847   HGB 11.8 04/15/2019 1231   HCT 38.5 01/27/2022 1116   HCT 36.4 04/15/2019 1231   PLT 293 01/27/2022 1116   PLT 398 09/16/2019 0847   PLT 451 (H) 04/15/2019 1231   MCV 88.3 01/27/2022 1116   MCV 84  04/15/2019 1231   MCH 29.6 01/27/2022 1116   MCHC 33.5 01/27/2022 1116   RDW 13.9 01/27/2022 1116   RDW 14.5 04/15/2019 1231   LYMPHSABS 2.1 01/27/2022 1116   LYMPHSABS 3.8 (H) 05/09/2018 1126   MONOABS 0.9 01/27/2022 1116   EOSABS 0.3 01/27/2022 1116   EOSABS 0.4 05/09/2018 1126   BASOSABS 0.1 01/27/2022 1116   BASOSABS 0.1 05/09/2018 1126    BMET    Component Value Date/Time   NA 139 09/11/2021 1901   NA 140 07/27/2021 1232   K 4.4 09/11/2021 1901   CL 103 09/11/2021 1901   CO2 29 09/11/2021 1901   GLUCOSE 80 09/11/2021 1901   BUN 14 09/11/2021 1901   BUN 13 07/27/2021 1232   CREATININE 0.84 09/11/2021 1901   CREATININE 1.09 (H) 07/23/2018 1026   CREATININE 0.84 11/07/2016 1641   CALCIUM 9.6 09/11/2021 1901   GFRNONAA >60 09/11/2021 1901   GFRNONAA 60 (L) 07/23/2018 1026   GFRNONAA 85 11/07/2016 1641   GFRAA 100 04/06/2020 1410   GFRAA >60 07/23/2018 1026   GFRAA >89 11/07/2016 1641    BNP    Component Value Date/Time   BNP 53.3 09/08/2014 2213     Imaging:  DG Chest 2 View  Result Date: 06/28/2022 CLINICAL DATA:  chest congestion; productive cough; asthma exacerbation EXAM: CHEST - 2 VIEW COMPARISON:  None Available. FINDINGS: Mildly increased conspicuity of streaky opacities in the mid to basilar lungs. No confluent consolidation. No visible pleural effusions or pneumothorax. Cardiomediastinal silhouette is unchanged. No acute osseous abnormality. IMPRESSION: Mildly increased conspicuity of streaky opacities in the mid to basilar lungs, which could represent atelectasis/scar or early pneumonia. Electronically Signed   By: Margaretha Sheffield M.D.   On: 06/28/2022 12:21    ipratropium-albuterol (DUONEB) 0.5-2.5 (3) MG/3ML nebulizer solution  3 mL     Date Action Dose Route User   06/28/2022 1208 Given 3 mL Nebulization Tamala Julian, April L, RN      methylPREDNISolone acetate (DEPO-MEDROL) injection 80 mg     Date Action Dose Route User   06/28/2022 1207 Given 80  mg Intramuscular (Left Ventrogluteal) Monna Fam L, RN          Latest Ref Rng & Units 02/01/2018    9:54 AM 11/03/2014    9:38 AM  PFT Results  FVC-Pre L 2.03  1.84   FVC-Predicted Pre % 78  70   FVC-Post L 1.99  1.83   FVC-Predicted Post % 76  70   Pre FEV1/FVC % % 81  83   Post FEV1/FCV % % 87  85   FEV1-Pre L 1.65  1.52   FEV1-Predicted Pre % 78  71   FEV1-Post L 1.73  1.56   DLCO uncorrected ml/min/mmHg 15.96  19.95   DLCO UNC% % 131  164   DLVA Predicted % 169  201   TLC L 3.83  3.47   TLC % Predicted % 103  93   RV % Predicted % 139  107     Lab Results  Component Value Date   NITRICOXIDE 16 07/05/2016        Assessment & Plan:   Severe asthma with acute exacerbation Severe asthma, previously well controlled on Dupixent.  She is currently having a unresolved acute asthma exacerbation with elevated exhaled nitric oxide.  Initially improved while on Z-Pak and steroids.  Unfortunately, when she completed these, symptoms worsen.  We will treat her with Depo injection 80 mg x 1 and neb in office.  Extended prednisone taper. CXR to rule out superimposed infection.  She had an elevation in her eosinophil count to 700 on labs from 10/3. We will need to recheck this once she is off steroids to ensure she is not developing eosinophilia r/t Dupixent.   Patient Instructions  Continue Albuterol inhaler 2 puffs or 3 mL neb every 6 hours as needed for shortness of breath or wheezing. Notify if symptoms persist despite rescue inhaler/neb use. Use neb twice daily  Continue Dulera 2 puffs Twice daily. Brush tongue and rinse mouth afterwards Continue Spiriva 2 puffs daily  Continue Dupixent injections every 14 days Continue montelukast (singulair) 1 tab At bedtime   Prednisone taper. 4 tabs for 3 days, then 3 tabs for 3 days, 2 tabs for 3 days, then 1 tab for 3 days, then stop. Take in AM with food. Start tomorrow.  Delsym over the counter 2 tsp Twice daily for cough  Tessalon  perles (benzonatate) 1 capsule Three times a day for cough   Chest x ray today  Follow up in 7-10 days with Dr. Loanne Drilling or [place]. If symptoms do not improve or worsen, please contact office for sooner follow up or seek emergency care.     CAP (community acquired pneumonia) Streaky opacities in the mid to basilar lungs, concerning for pneumonia given her worsening symptoms.  Vital signs stable and nontoxic-appearing.  We will treat her with Augmentin 7-day course.  Target mucociliary clearance therapies.  Close follow-up.  I spent 45 minutes of dedicated to the care of this patient on the date of this encounter to include pre-visit review of records, face-to-face time with the patient discussing conditions above, post visit ordering of testing, clinical documentation with the electronic health record, making appropriate referrals as documented, and communicating necessary findings  to members of the patients care team.  Clayton Bibles, NP 06/28/2022  Pt aware and understands NP's role.

## 2022-06-28 NOTE — Assessment & Plan Note (Signed)
Severe asthma, previously well controlled on Dupixent.  She is currently having a unresolved acute asthma exacerbation with elevated exhaled nitric oxide.  Initially improved while on Z-Pak and steroids.  Unfortunately, when she completed these, symptoms worsen.  We will treat her with Depo injection 80 mg x 1 and neb in office.  Extended prednisone taper. CXR to rule out superimposed infection.  She had an elevation in her eosinophil count to 700 on labs from 10/3. We will need to recheck this once she is off steroids to ensure she is not developing eosinophilia r/t Dupixent.   Patient Instructions  Continue Albuterol inhaler 2 puffs or 3 mL neb every 6 hours as needed for shortness of breath or wheezing. Notify if symptoms persist despite rescue inhaler/neb use. Use neb twice daily  Continue Dulera 2 puffs Twice daily. Brush tongue and rinse mouth afterwards Continue Spiriva 2 puffs daily  Continue Dupixent injections every 14 days Continue montelukast (singulair) 1 tab At bedtime   Prednisone taper. 4 tabs for 3 days, then 3 tabs for 3 days, 2 tabs for 3 days, then 1 tab for 3 days, then stop. Take in AM with food. Start tomorrow.  Delsym over the counter 2 tsp Twice daily for cough  Tessalon perles (benzonatate) 1 capsule Three times a day for cough   Chest x ray today  Follow up in 7-10 days with Dr. Loanne Drilling or [place]. If symptoms do not improve or worsen, please contact office for sooner follow up or seek emergency care.

## 2022-06-28 NOTE — Patient Instructions (Addendum)
Continue Albuterol inhaler 2 puffs or 3 mL neb every 6 hours as needed for shortness of breath or wheezing. Notify if symptoms persist despite rescue inhaler/neb use. Use neb twice daily  Continue Dulera 2 puffs Twice daily. Brush tongue and rinse mouth afterwards Continue Spiriva 2 puffs daily  Continue Dupixent injections every 14 days Continue montelukast (singulair) 1 tab At bedtime   Prednisone taper. 4 tabs for 3 days, then 3 tabs for 3 days, 2 tabs for 3 days, then 1 tab for 3 days, then stop. Take in AM with food. Start tomorrow.  Augmentin 1 tab Twice daily for 7 days. Take with food. Eat yogurt or take a daily probiotic while on antibiotics  Delsym over the counter 2 tsp Twice daily for cough  Tessalon perles (benzonatate) 1 capsule Three times a day for cough   Chest x ray today  Follow up in 7-10 days with Dr. Loanne Drilling or [place]. If symptoms do not improve or worsen, please contact office for sooner follow up or seek emergency care.

## 2022-06-29 ENCOUNTER — Encounter: Payer: Self-pay | Admitting: Nurse Practitioner

## 2022-07-11 ENCOUNTER — Ambulatory Visit (INDEPENDENT_AMBULATORY_CARE_PROVIDER_SITE_OTHER): Payer: Medicaid Other

## 2022-07-11 ENCOUNTER — Encounter: Payer: Self-pay | Admitting: Nurse Practitioner

## 2022-07-11 ENCOUNTER — Ambulatory Visit (INDEPENDENT_AMBULATORY_CARE_PROVIDER_SITE_OTHER): Payer: Medicaid Other | Admitting: Nurse Practitioner

## 2022-07-11 VITALS — BP 130/80 | HR 93 | Ht <= 58 in | Wt 190.6 lb

## 2022-07-11 DIAGNOSIS — J189 Pneumonia, unspecified organism: Secondary | ICD-10-CM

## 2022-07-11 DIAGNOSIS — J4551 Severe persistent asthma with (acute) exacerbation: Secondary | ICD-10-CM

## 2022-07-11 LAB — POCT EXHALED NITRIC OXIDE: FeNO level (ppb): 13

## 2022-07-11 MED ORDER — LEVOFLOXACIN 500 MG PO TABS: 500.0000 mg | ORAL_TABLET | Freq: Every day | ORAL | 0 refills | Status: AC

## 2022-07-11 NOTE — Progress Notes (Signed)
_0  ID: [name], female    DOB: 1970/12/09, 51 y.o.   MRN: [id number]  Chief Complaint  Patient presents with   Follow-up    Pt f/u she is still feeling the same symptoms. They had improved but in the last 2 days they have gotten worse again. Dupixent injection is today    Referring provider: [name], NP  HPI: 51 year old female, never smoker followed for severe persistent asthma on Dupixent.  She is a patient of Dr. Cordelia Pen and last seen in office on 06/28/2022 by [facility] NP.  Past medical history significant for hypertension, OSA, NASH, Cushing syndrome, hypothyroid, DM 2, rheumatoid arthritis on methotrexate, CKD stage II, morbid obesity, IDA, vitamin D deficiency, anxiety, chronic back pain, diastolic dysfunction.  TEST/EVENTS:  11/24/2017 HRCT chest: No evidence of ILD.  There is mild air trapping.  Atherosclerosis is present.  There is also hepatic steatosis. 02/01/2018 PFT: FVC 78, FEV1 78, ratio 87, TLC 103, DLCOunc 131.  No significant bronchodilator response in FEV1.  Did have mid flow reversibility. 06/28/2022 CXR: streaky opacities in the mid to basilar lungs, may represent atelectasis/scar or early pneumonia   11/16/2021: OV with Dr. Loanne Drilling.  Doing well since last visit.  Overall well controlled on Dupixent.  Compliant with Spiriva and Dulera.  Usually only needs albuterol once a week.  Discussed asthma action plan.  Advised her to increase Dulera to 2 puffs 3 times a day for worsening shortness of breath, cough or wheezing.  If no improvement in 24 to 48 hours, please contact our office for evaluation/taper  06/28/2022: OV with Mical Brun NP for acute visit.  She had contacted the office on 10/6 after being seen by her PCP and treated for acute asthma exacerbation with prednisone and Z-Pak.  She was initially feeling better.  Today, she tells me that since she came off the steroids and antibiotic, she started to feel worse again.  She has a persistent cough which  is congested.  She feels like she has to get sputum up but has not been able to produce any.  She has a lot of chest tightness and wheezing.  She also feels more short of breath, especially with coughing spells.  She does have some PND at night.  Denies any fevers, chills, hemoptysis, URI symptoms, recent sick exposures, increased lower extremity swelling.  She is on [place].  Receives Dupixent injections; last administration was 10/16.  Takes Singulair for trigger prevention. Eating and drinking well. Feeling fatigued. CXR with streaky opacities - treated for possible CAP with augmentin 7 day course. FeNO elevated - treated for asthma exacerbation with prednisone taper. Cough control measures.  07/11/2022: Today - follow up Patient presents today for follow up. She was initially feeling better but over the past two days, has started to have a more productive cough again with yellow sputum. She's also feeling more run down and fatigued. She feels like her chest congestion is worse. Her wheezing has improved and she doesn't feel as short winded. Finishing prednisone today. She denies fevers, chills, hemoptysis, leg swelling. She is using her dulera and spiriva as ordered.   Allergies  Allergen Reactions   Hydrocortisone Rash and Hives   Shellfish Allergy Anaphylaxis and Hives   Sulfamethoxazole-Trimethoprim Rash, Other (See Comments) and Itching    Facial redness with pruritus  Facial redness with pruritus     Immunization History  Administered Date(s) Administered   Hepatitis B 03/23/2015, 07/06/2015   Hepatitis B, PED/ADOLESCENT 03/23/2015,  07/06/2015   Influenza Split 05/25/2015   Influenza,inj,Quad PF,6+ Mos 05/13/2016, 05/29/2017, 07/13/2018, 06/03/2019, 06/17/2020   Influenza-Unspecified 05/25/2015, 05/13/2016, 05/29/2017, 07/13/2018, 06/03/2019   PFIZER(Purple Top)SARS-COV-2 Vaccination 12/02/2019, 12/23/2019   Pneumococcal Polysaccharide-23 07/31/2017    Pneumococcal-Unspecified 04/12/2012   Tdap 06/24/2015    Past Medical History:  Diagnosis Date   Anemia    Asthma    Bilateral hand numbness 06/25/2015   Bone pain 02/27/2015   Diffuse bone pain in legs mostly but also in arms     Breast pain, left 03/27/2015   Callus of heel 12/16/2014   Compression fracture    Compression fracture of L1 lumbar vertebra ([place]) 08/26/2014   Compression fracture of T12 vertebra ([place]) 08/28/2014   COPD (chronic obstructive pulmonary disease) ([place]) 2013   Cushing syndrome ([place]) 2013    Depression    Diabetes ([place]) 2013    Diabetes mellitus type 2, uncontrolled 08/26/2014   Sees Dr. Katy Fitch.  Last visit 11/27/14.  No diabetic retinopathy     Diabetes mellitus with neuropathy (HCC)    Diastolic dysfunction without heart failure 10/14/2014   Grade 2 diastolic dysfunction 2/2 pulm HTN    Generalized anxiety disorder 03/27/2015   HTN (hypertension) 2005    Hx of cataract surgery 11/03/2014   LUQ abdominal pain 07/17/2015   Medication management 05/12/2015   Morbid obesity ([place]) 09/08/2014   MRSA colonization 12/15/2014   Non-English speaking patient 09/08/2014   OSA (obstructive sleep apnea) 05/20/2015   Osteopenia 11/19/2013   Dx with osteopenia in lumbar vertebra with partial compression of L1   Pain due to onychomycosis of toenail 03/27/2015   Secondary Cushing's syndrome/ iatrogenic    Skin rash 07/17/2015   Tachycardia 01/13/2015   Upper airway cough syndrome 09/05/2014   Followed in Pulmonary clinic/ [facility]/ [place]  - Trial off advair      09/05/2014 >>>  - trial off theoph 09/17/2014 and added flutter  - rec reduce dulera to 100 2 bid 07/13/2015      Vision changes 06/25/2015   Vitamin D deficiency 08/28/2014    Tobacco History: Social History   Tobacco Use  Smoking Status Never  Smokeless Tobacco Never   Counseling given: Not Answered   Outpatient Medications Prior to Visit  Medication Sig Dispense Refill   albuterol (PROVENTIL) (2.5  MG/3ML) 0.083% nebulizer solution Take 3 mLs (2.5 mg total) by nebulization every 6 (six) hours as needed for wheezing or shortness of breath. 75 mL 5   albuterol (VENTOLIN HFA) 108 (90 Base) MCG/ACT inhaler Inhale 2 puffs into the lungs every 6 (six) hours as needed for wheezing or shortness of breath. 8 g 6   amoxicillin-clavulanate (AUGMENTIN) 875-125 MG tablet Take 1 tablet by mouth 2 (two) times daily. 14 tablet 0   aspirin EC 81 MG tablet Take 1 tablet (81 mg total) by mouth daily. 90 tablet 3   BD INSULIN SYRINGE U-500 31G X 6MM 0.5 ML MISC U UTD TID SUBCUTANEOUSLY     benzonatate (TESSALON) 200 MG capsule Take 1 capsule (200 mg total) by mouth 3 (three) times daily as needed for cough. 30 capsule 1   Blood Glucose Monitoring Suppl (ACCU-CHEK AVIVA PLUS) W/DEVICE KIT Used as directed 1 kit 0   Buprenorphine HCl (BELBUCA) 450 MCG FILM Place inside cheek.     cabergoline (DOSTINEX) 0.5 MG tablet Take 0.5 tablets (0.25 mg total) by mouth 2 (two) times a week. 10 tablet 3   calcium carbonate (OS-CAL) 600 MG TABS tablet  Take 1 tablet by mouth daily.     cholecalciferol (VITAMIN D) 1000 UNITS tablet Take 1,000 Units by mouth every morning.     cloNIDine (CATAPRES) 0.2 MG tablet Take 1 tablet (0.2 mg total) by mouth 2 (two) times daily. 180 tablet 3   dexlansoprazole (DEXILANT) 60 MG capsule Take 60 mg by mouth daily.     dupilumab (DUPIXENT) 300 MG/2ML prefilled syringe Inject 300 mg into the skin every 14 (fourteen) days. 12 mL 1   folic acid (FOLVITE) 811 MCG tablet Take 400 mcg by mouth daily.     furosemide (LASIX) 20 MG tablet Take 1 tablet (20 mg total) by mouth every morning. 30 tablet 2   gabapentin (NEURONTIN) 400 MG capsule Take 2 capsules (800 mg total) by mouth 3 (three) times daily. 180 capsule 3   glucose blood (RELION GLUCOSE TEST STRIPS) test strip 1 each by Other route 4 (four) times daily. ICD code E11.65 400 each 3   hydrOXYzine (ATARAX/VISTARIL) 25 MG tablet Take 25 mg by mouth  at bedtime as needed.     insulin aspart (NOVOLOG) 100 UNIT/ML FlexPen Inject 12 Units into the skin 3 (three) times daily.     Lancet Devices (ACCU-CHEK SOFTCLIX) lancets Use as instructed 1 each 0   levothyroxine (SYNTHROID, LEVOTHROID) 75 MCG tablet Take 112 mcg by mouth daily before breakfast.      linaclotide (LINZESS) 145 MCG CAPS capsule Take 145 mcg by mouth daily before breakfast.     losartan (COZAAR) 100 MG tablet Take 1 tablet (100 mg total) by mouth every morning. 30 tablet 6   methocarbamol (ROBAXIN) 750 MG tablet Take 1 tablet (750 mg total) by mouth 3 (three) times daily. MUST MAKE APPT FOR FURTHER REFILLS 60 tablet 0   methotrexate (RHEUMATREX) 2.5 MG tablet Take 2.5 mg by mouth once a week. 8 pills wkly .Caution:Chemotherapy. Protect from light.      metoprolol tartrate (LOPRESSOR) 100 MG tablet TAKE 1 TAB AN HOUR BEFORE CT 1 tablet 0   Misc. Devices (CANE) MISC 1 each by Does not apply route daily. 1 each 0   montelukast (SINGULAIR) 10 MG tablet Take 1 tablet (10 mg total) by mouth at bedtime. 90 tablet 3   PARoxetine (PAXIL) 20 MG tablet Take 1 tablet (20 mg total) by mouth daily. MUST MAKE APPT FOR FURTHER REFILLS 30 tablet 1   predniSONE (DELTASONE) 10 MG tablet 4 tabs for 3 days, then 3 tabs for 3 days, 2 tabs for 3 days, then 1 tab for 3 days, then stop 30 tablet 0   promethazine (PHENERGAN) 25 MG tablet Take 1 tablet (25 mg total) by mouth every 6 (six) hours as needed for nausea or vomiting. 15 tablet 0   rosuvastatin (CRESTOR) 20 MG tablet Take 1 tablet (20 mg total) by mouth daily. 90 tablet 3   spironolactone (ALDACTONE) 25 MG tablet Take 25 mg by mouth daily.     terbinafine (LAMISIL) 250 MG tablet Take 1 tablet (250 mg total) by mouth daily. 90 tablet 0   Tiotropium Bromide Monohydrate (SPIRIVA RESPIMAT) 1.25 MCG/ACT AERS INHALE 2 SPRAY(S) BY MOUTH ONCE DAILY 12 g 3   TOUJEO MAX SOLOSTAR 300 UNIT/ML Solostar Pen SMARTSIG:60 Unit(s) SUB-Q Every Night     traMADol  (ULTRAM) 50 MG tablet Take by mouth every 6 (six) hours as needed.     traZODone (DESYREL) 50 MG tablet Take 50 mg by mouth at bedtime.     verapamil (CALAN-SR) 180 MG  CR tablet Take 2 tablets (360 mg total) by mouth at bedtime. 180 tablet 2   mometasone-formoterol (DULERA) 200-5 MCG/ACT AERO Inhale 2 puffs into the lungs 2 (two) times daily. 3 each 3   nitroGLYCERIN (NITROSTAT) 0.4 MG SL tablet Place 1 tablet (0.4 mg total) under the tongue every 5 (five) minutes as needed. 25 tablet 3   Facility-Administered Medications Prior to Visit  Medication Dose Route Frequency Provider Last Rate Last Admin   betamethasone acetate-betamethasone sodium phosphate (CELESTONE) injection 3 mg  3 mg Intramuscular Once Daylene Katayama M, DPM       DOBUTamine (DOBUTREX) 1,000 mcg/mL in dextrose 5% 250 mL infusion  30 mcg/kg/min Intravenous Continuous Croitoru, Mihai, MD 145.3 mL/hr at 04/22/15 1300 30 mcg/kg/min at 04/22/15 1300     Review of Systems:   Constitutional: No weight loss or gain, night sweats, fevers, chills. +fatigue, lassitude. HEENT: No headaches, difficulty swallowing, tooth/dental problems, or sore throat. No sneezing, itching, ear ache, nasal congestion, or post nasal drip CV:  No chest pain, PND, orthopnea, swelling in lower extremities, anasarca, dizziness, palpitations, syncope Resp: +shortness of breath with exertion; productive cough. No hemoptysis. No wheezing. No chest wall deformity GI:  No heartburn, indigestion, abdominal pain, nausea, vomiting, diarrhea, change in bowel habits, loss of appetite, bloody stools.  MSK:  No joint pain or swelling.  No decreased range of motion.  No back pain. Neuro: No dizziness or lightheadedness.  Psych: No depression or anxiety. Mood stable.     Physical Exam:  BP 130/80   Pulse 93   Ht _0  (1.473 m)   Wt 190 lb 9.6 oz (86.5 kg)   LMP 07/20/2018   SpO2 99%   BMI 39.84 kg/m   GEN: Pleasant, interactive, well-developed; obese; in no  acute distress. HEENT:  Normocephalic and atraumatic. PERRLA. Sclera white. Nasal turbinates pink, moist and patent bilaterally. No rhinorrhea present. Oropharynx pink and moist, without exudate or edema. No lesions, ulcerations, or postnasal drip.  NECK:  Supple w/ fair ROM. No JVD present. Normal carotid impulses w/o bruits. Thyroid symmetrical with no goiter or nodules palpated. No lymphadenopathy.   CV: RRR, no m/r/g, no peripheral edema. Pulses intact, +2 bilaterally. No cyanosis, pallor or clubbing. PULMONARY:  Unlabored, regular breathing. Rhonchi bilaterally A&P. No accessory muscle use.  GI: BS present and normoactive. Soft, non-tender to palpation. No organomegaly or masses detected.  MSK: No erythema, warmth or tenderness. No deformities or joint swelling noted.  Neuro: A/Ox3. No focal deficits noted.   Skin: Warm, no lesions or rashe Psych: Normal affect and behavior. Judgement and thought content appropriate.     Lab Results:  CBC    Component Value Date/Time   WBC 7.5 01/27/2022 1116   RBC 4.36 01/27/2022 1116   HGB 12.9 01/27/2022 1116   HGB 11.0 (L) 09/16/2019 0847   HGB 11.8 04/15/2019 1231   HCT 38.5 01/27/2022 1116   HCT 36.4 04/15/2019 1231   PLT 293 01/27/2022 1116   PLT 398 09/16/2019 0847   PLT 451 (H) 04/15/2019 1231   MCV 88.3 01/27/2022 1116   MCV 84 04/15/2019 1231   MCH 29.6 01/27/2022 1116   MCHC 33.5 01/27/2022 1116   RDW 13.9 01/27/2022 1116   RDW 14.5 04/15/2019 1231   LYMPHSABS 2.1 01/27/2022 1116   LYMPHSABS 3.8 (H) 05/09/2018 1126   MONOABS 0.9 01/27/2022 1116   EOSABS 0.3 01/27/2022 1116   EOSABS 0.4 05/09/2018 1126   BASOSABS 0.1 01/27/2022 1116   BASOSABS  0.1 05/09/2018 1126    BMET    Component Value Date/Time   NA 139 09/11/2021 1901   NA 140 07/27/2021 1232   K 4.4 09/11/2021 1901   CL 103 09/11/2021 1901   CO2 29 09/11/2021 1901   GLUCOSE 80 09/11/2021 1901   BUN 14 09/11/2021 1901   BUN 13 07/27/2021 1232   CREATININE  0.84 09/11/2021 1901   CREATININE 1.09 (H) 07/23/2018 1026   CREATININE 0.84 11/07/2016 1641   CALCIUM 9.6 09/11/2021 1901   GFRNONAA >60 09/11/2021 1901   GFRNONAA 60 (L) 07/23/2018 1026   GFRNONAA 85 11/07/2016 1641   GFRAA 100 04/06/2020 1410   GFRAA >60 07/23/2018 1026   GFRAA >89 11/07/2016 1641    BNP    Component Value Date/Time   BNP 53.3 09/08/2014 2213     Imaging:  DG Chest 2 View  Result Date: 07/11/2022 CLINICAL DATA:  recent CAP; worsening productive cough post abx EXAM: CHEST - 2 VIEW COMPARISON:  Radiograph 06/28/2022 FINDINGS: Unchanged cardiomediastinal silhouette. Mildly increased, faint right basilar airspace opacity. No pleural effusion. No pneumothorax. Multiple chronic midthoracic compression deformities. IMPRESSION: Mildly increased, faint right basilar airspace opacities, suspicious for pneumonia. Electronically Signed   By: Maurine Simmering M.D.   On: 07/11/2022 11:22   DG Chest 2 View  Result Date: 06/28/2022 CLINICAL DATA:  chest congestion; productive cough; asthma exacerbation EXAM: CHEST - 2 VIEW COMPARISON:  None Available. FINDINGS: Mildly increased conspicuity of streaky opacities in the mid to basilar lungs. No confluent consolidation. No visible pleural effusions or pneumothorax. Cardiomediastinal silhouette is unchanged. No acute osseous abnormality. IMPRESSION: Mildly increased conspicuity of streaky opacities in the mid to basilar lungs, which could represent atelectasis/scar or early pneumonia. Electronically Signed   By: Margaretha Sheffield M.D.   On: 06/28/2022 12:21    ipratropium-albuterol (DUONEB) 0.5-2.5 (3) MG/3ML nebulizer solution 3 mL     Date Action Dose Route User   06/28/2022 1208 Given 3 mL Nebulization Smith, April L, RN      methylPREDNISolone acetate (DEPO-MEDROL) injection 80 mg     Date Action Dose Route User   06/28/2022 1207 Given 80 mg Intramuscular (Left Ventrogluteal) Monna Fam L, RN          Latest Ref Rng &  Units 02/01/2018    9:54 AM 11/03/2014    9:38 AM  PFT Results  FVC-Pre L 2.03  1.84   FVC-Predicted Pre % 78  70   FVC-Post L 1.99  1.83   FVC-Predicted Post % 76  70   Pre FEV1/FVC % % 81  83   Post FEV1/FCV % % 87  85   FEV1-Pre L 1.65  1.52   FEV1-Predicted Pre % 78  71   FEV1-Post L 1.73  1.56   DLCO uncorrected ml/min/mmHg 15.96  19.95   DLCO UNC% % 131  164   DLVA Predicted % 169  201   TLC L 3.83  3.47   TLC % Predicted % 103  93   RV % Predicted % 139  107     Lab Results  Component Value Date   NITRICOXIDE 16 07/05/2016        Assessment & Plan:   CAP (community acquired pneumonia) Clinical worsening and increased right basilar opacity. Given her persistent symptoms, we will obtain sputum culture. Start levaquin 500 mg 7 day course. Encouraged to target mucociliary clearance therapies. Non-toxic and stable VS. Close follow up and strict ED precautions.   Patient  Instructions  Continue Albuterol inhaler 2 puffs or 3 mL neb every 6 hours as needed for shortness of breath or wheezing. Notify if symptoms persist despite rescue inhaler/neb use. Use neb twice daily  Continue Dulera 2 puffs Twice daily. Brush tongue and rinse mouth afterwards Continue Spiriva 2 puffs daily  Continue Dupixent injections every 14 days Continue montelukast (singulair) 1 tab At bedtime   Levofloxacin 500 mg 1 tab daily for 7 days. Take with food. Notify and stop immediately if tendon pain develops or you have more than 3-4 watery bowel movements in 24 hours.  Mucinex [phone] mg Twice daily for congestion Flutter valve 2-3 times a day after nebulizer treatment    Sputum culture    Follow up in one week with Dr. Loanne Drilling or Katie Tran Randle,NP. If symptoms do not improve or worsen, please contact office for sooner follow up or seek emergency care.     Severe asthma with acute exacerbation Severe asthma; treated for recent exacerbation with elevated exhaled nitric oxide with prednisone taper.  Her FeNO has normalized today. Advised her to complete prednisone as previously prescribed. See above plan. Continue triple therapy regimen. Encouraged her to use nebs twice daily until symptoms improve.  She had an elevation in her eosinophil count to 700 on labs from 10/3. We will need to recheck this once she is off steroids to ensure she is not developing eosinophilia r/t Dupixent.      I spent 41 minutes of dedicated to the care of this patient on the date of this encounter to include pre-visit review of records, face-to-face time with the patient discussing conditions above, post visit ordering of testing, clinical documentation with the electronic health record, making appropriate referrals as documented, and communicating necessary findings to members of the patients care team.  Clayton Bibles, NP 07/11/2022  Pt aware and understands NP's role.

## 2022-07-11 NOTE — Patient Instructions (Signed)
Continue Albuterol inhaler 2 puffs or 3 mL neb every 6 hours as needed for shortness of breath or wheezing. Notify if symptoms persist despite rescue inhaler/neb use. Use neb twice daily  Continue Dulera 2 puffs Twice daily. Brush tongue and rinse mouth afterwards Continue Spiriva 2 puffs daily  Continue Dupixent injections every 14 days Continue montelukast (singulair) 1 tab At bedtime   Levofloxacin 500 mg 1 tab daily for 7 days. Take with food. Notify and stop immediately if tendon pain develops or you have more than 3-4 watery bowel movements in 24 hours.  Mucinex [phone] mg Twice daily for congestion Flutter valve 2-3 times a day after nebulizer treatment    Sputum culture    Follow up in one week with Dr. Loanne Drilling or Katie Orlanda Lemmerman,NP. If symptoms do not improve or worsen, please contact office for sooner follow up or seek emergency care.

## 2022-07-11 NOTE — Assessment & Plan Note (Signed)
Severe asthma; treated for recent exacerbation with elevated exhaled nitric oxide with prednisone taper. Her FeNO has normalized today. Advised her to complete prednisone as previously prescribed. See above plan. Continue triple therapy regimen. Encouraged her to use nebs twice daily until symptoms improve.  She had an elevation in her eosinophil count to 700 on labs from 10/3. We will need to recheck this once she is off steroids to ensure she is not developing eosinophilia r/t Dupixent.

## 2022-07-11 NOTE — Assessment & Plan Note (Signed)
Clinical worsening and increased right basilar opacity. Given her persistent symptoms, we will obtain sputum culture. Start levaquin 500 mg 7 day course. Encouraged to target mucociliary clearance therapies. Non-toxic and stable VS. Close follow up and strict ED precautions.   Patient Instructions  Continue Albuterol inhaler 2 puffs or 3 mL neb every 6 hours as needed for shortness of breath or wheezing. Notify if symptoms persist despite rescue inhaler/neb use. Use neb twice daily  Continue Dulera 2 puffs Twice daily. Brush tongue and rinse mouth afterwards Continue Spiriva 2 puffs daily  Continue Dupixent injections every 14 days Continue montelukast (singulair) 1 tab At bedtime   Levofloxacin 500 mg 1 tab daily for 7 days. Take with food. Notify and stop immediately if tendon pain develops or you have more than 3-4 watery bowel movements in 24 hours.  Mucinex [phone] mg Twice daily for congestion Flutter valve 2-3 times a day after nebulizer treatment    Sputum culture    Follow up in one week with Dr. Loanne Drilling or Katie Johnella Crumm,NP. If symptoms do not improve or worsen, please contact office for sooner follow up or seek emergency care.

## 2022-07-12 ENCOUNTER — Other Ambulatory Visit: Payer: Medicaid Other

## 2022-07-12 DIAGNOSIS — J189 Pneumonia, unspecified organism: Secondary | ICD-10-CM

## 2022-07-13 LAB — RESPIRATORY CULTURE OR RESPIRATORY AND SPUTUM CULTURE: MICRO NUMBER:: 14124124

## 2022-07-18 ENCOUNTER — Ambulatory Visit (INDEPENDENT_AMBULATORY_CARE_PROVIDER_SITE_OTHER): Payer: Medicaid Other | Admitting: Nurse Practitioner

## 2022-07-18 ENCOUNTER — Encounter: Payer: Self-pay | Admitting: Nurse Practitioner

## 2022-07-18 VITALS — BP 124/74 | HR 94 | Ht <= 58 in | Wt 195.0 lb

## 2022-07-18 DIAGNOSIS — J4550 Severe persistent asthma, uncomplicated: Secondary | ICD-10-CM | POA: Diagnosis not present

## 2022-07-18 DIAGNOSIS — J189 Pneumonia, unspecified organism: Secondary | ICD-10-CM | POA: Diagnosis not present

## 2022-07-18 DIAGNOSIS — J4551 Severe persistent asthma with (acute) exacerbation: Secondary | ICD-10-CM | POA: Diagnosis not present

## 2022-07-18 MED ORDER — ALBUTEROL SULFATE (2.5 MG/3ML) 0.083% IN NEBU: 2.5000 mg | INHALATION_SOLUTION | Freq: Four times a day (QID) | RESPIRATORY_TRACT | 5 refills | Status: DC | PRN

## 2022-07-18 NOTE — Patient Instructions (Addendum)
Continue Albuterol inhaler 2 puffs or 3 mL neb every 6 hours as needed for shortness of breath or wheezing. Notify if symptoms persist despite rescue inhaler/neb use. Use neb twice daily  Continue Dulera 2 puffs Twice daily. Brush tongue and rinse mouth afterwards Continue Spiriva 2 puffs daily  Continue Dupixent injections every 14 days Continue montelukast (singulair) 1 tab At bedtime  Continue Mucinex [phone] mg Twice daily for congestion Continue Flutter valve 2-3 times a day after nebulizer treatment    Follow up in six weeks with repeat CXR with Dr. Loanne Drilling (1st) or Katie Atticus Lemberger,NP. If symptoms do not improve or worsen, please contact office for sooner follow up or seek emergency care.

## 2022-07-18 NOTE — Addendum Note (Signed)
Addended by: Priscille Kluver on: 07/18/2022 11:06 AM   Modules accepted: Orders

## 2022-07-18 NOTE — Assessment & Plan Note (Signed)
Severe asthma with resolved exacerbation. See above. She will continue triple therapy regimen with Dulera and Spiriva.

## 2022-07-18 NOTE — Assessment & Plan Note (Signed)
Clinically improved. Completed levaquin 7 day course yesterday. Sputum culture was unable to be processed due to oral sections. Given her improvement, no need to attempt to recollect today. Close monitoring of symptoms. Repeat CXR in 6 weeks to ensure resolution.   Patient Instructions  Continue Albuterol inhaler 2 puffs or 3 mL neb every 6 hours as needed for shortness of breath or wheezing. Notify if symptoms persist despite rescue inhaler/neb use. Use neb twice daily  Continue Dulera 2 puffs Twice daily. Brush tongue and rinse mouth afterwards Continue Spiriva 2 puffs daily  Continue Dupixent injections every 14 days Continue montelukast (singulair) 1 tab At bedtime  Continue Mucinex [phone] mg Twice daily for congestion Continue Flutter valve 2-3 times a day after nebulizer treatment    Follow up in six weeks with repeat CXR with Dr. Loanne Drilling (1st) or Katie Azuri Bozard,NP. If symptoms do not improve or worsen, please contact office for sooner follow up or seek emergency care.

## 2022-07-18 NOTE — Progress Notes (Signed)
_0  ID: Raven Turner, female    DOB: [birth year], 51 y.o.   MRN: [id number]  Chief Complaint  Patient presents with   Follow-up    Pt f/u for pneumonia, she reports she is feeling better, finished antibiotic course yesterday. Inhalers and dupixent are still helping w/ he asthma    Referring provider: [name], NP  HPI: 51 year old female, never smoker followed for severe persistent asthma on Dupixent.  She is a patient of Dr. Cordelia Pen and last seen in office on 07/11/2022 by [facility] NP.  Past medical history significant for hypertension, OSA, NASH, Cushing syndrome, hypothyroid, DM 2, rheumatoid arthritis on methotrexate, CKD stage II, morbid obesity, IDA, vitamin D deficiency, anxiety, chronic back pain, diastolic dysfunction.  TEST/EVENTS:  11/24/2017 HRCT chest: No evidence of ILD.  There is mild air trapping.  Atherosclerosis is present.  There is also hepatic steatosis. 02/01/2018 PFT: FVC 78, FEV1 78, ratio 87, TLC 103, DLCOunc 131.  No significant bronchodilator response in FEV1.  Did have mid flow reversibility. 06/28/2022 CXR: streaky opacities in the mid to basilar lungs, may represent atelectasis/scar or early pneumonia  07/11/2022 CXR: increased right basilar opacity   11/16/2021: OV with Dr. Loanne Drilling.  Doing well since last visit.  Overall well controlled on Dupixent.  Compliant with Spiriva and Dulera.  Usually only needs albuterol once a week.  Discussed asthma action plan.  Advised her to increase Dulera to 2 puffs 3 times a day for worsening shortness of breath, cough or wheezing.  If no improvement in 24 to 48 hours, please contact our office for evaluation/taper  06/28/2022: OV with Raven Lasseigne NP for acute visit.  She had contacted the office on 10/6 after being seen by her PCP and treated for acute asthma exacerbation with prednisone and Z-Pak.  She was initially feeling better.  Today, she tells me that since she came off the steroids and antibiotic, she started to  feel worse again.  She has a persistent cough which is congested.  She feels like she has to get sputum up but has not been able to produce any.  She has a lot of chest tightness and wheezing.  She also feels more short of breath, especially with coughing spells.  She does have some PND at night.  Denies any fevers, chills, hemoptysis, URI symptoms, recent sick exposures, increased lower extremity swelling.  She is on [place].  Receives Dupixent injections; last administration was 10/16.  Takes Singulair for trigger prevention. Eating and drinking well. Feeling fatigued. CXR with streaky opacities - treated for possible CAP with augmentin 7 day course. FeNO elevated - treated for asthma exacerbation with prednisone taper. Cough control measures.  07/11/2022: OV with Raven Goshorn NP for follow up. She was initially feeling better but over the past two days, has started to have a more productive cough again with yellow sputum. She's also feeling more run down and fatigued. She feels like her chest congestion is worse. Her wheezing has improved and she doesn't feel as short winded. Finishing prednisone today. She denies fevers, chills, hemoptysis, leg swelling. She is using her dulera and spiriva as ordered. Clinical worsening with increased right basilar opacity on imaging; treated with levaquin course. Ordered sputum culture.   07/18/2022: Today - follow up Patient presents today with interpretor for follow up. She is feeling much better today. Completed levaquin yesterday. Still having an occasional, productive cough but much improved. She feels like her breathing is back to her baseline for the most  part. Not feeling as tired/run down. Slowly gaining energy back. She denies fevers, chills, hemoptysis. She is using Dulera and Spiriva daily.   Allergies  Allergen Reactions   Hydrocortisone Rash and Hives   Shellfish Allergy Anaphylaxis and Hives   Sulfamethoxazole-Trimethoprim Rash, Other (See Comments)  and Itching    Facial redness with pruritus  Facial redness with pruritus     Immunization History  Administered Date(s) Administered   Hepatitis B 03/23/2015, 07/06/2015   Hepatitis B, PED/ADOLESCENT 03/23/2015, 07/06/2015   Influenza Split 05/25/2015   Influenza,inj,Quad PF,6+ Mos 05/13/2016, 05/29/2017, 07/13/2018, 06/03/2019, 06/17/2020   Influenza-Unspecified 05/25/2015, 05/13/2016, 05/29/2017, 07/13/2018, 06/03/2019   PFIZER(Purple Top)SARS-COV-2 Vaccination 12/02/2019, 12/23/2019   Pneumococcal Polysaccharide-23 07/31/2017   Pneumococcal-Unspecified 04/12/2012   Tdap 06/24/2015    Past Medical History:  Diagnosis Date   Anemia    Asthma    Bilateral hand numbness 06/25/2015   Bone pain 02/27/2015   Diffuse bone pain in legs mostly but also in arms     Breast pain, left 03/27/2015   Callus of heel 12/16/2014   Compression fracture    Compression fracture of L1 lumbar vertebra ([place]) 08/26/2014   Compression fracture of T12 vertebra ([place]) 08/28/2014   COPD (chronic obstructive pulmonary disease) ([place]) 2013   Cushing syndrome ([place]) 2013    Depression    Diabetes ([place]) 2013    Diabetes mellitus type 2, uncontrolled 08/26/2014   Sees Dr. Katy Fitch.  Last visit 11/27/14.  No diabetic retinopathy     Diabetes mellitus with neuropathy (HCC)    Diastolic dysfunction without heart failure 10/14/2014   Grade 2 diastolic dysfunction 2/2 pulm HTN    Generalized anxiety disorder 03/27/2015   HTN (hypertension) 2005    Hx of cataract surgery 11/03/2014   LUQ abdominal pain 07/17/2015   Medication management 05/12/2015   Morbid obesity ([place]) 09/08/2014   MRSA colonization 12/15/2014   Non-English speaking patient 09/08/2014   OSA (obstructive sleep apnea) 05/20/2015   Osteopenia 11/19/2013   Dx with osteopenia in lumbar vertebra with partial compression of L1   Pain due to onychomycosis of toenail 03/27/2015   Secondary Cushing's syndrome/ iatrogenic    Skin rash 07/17/2015   Tachycardia  01/13/2015   Upper airway cough syndrome 09/05/2014   Followed in Pulmonary clinic/ [facility]/ [place]  - Trial off advair      09/05/2014 >>>  - trial off theoph 09/17/2014 and added flutter  - rec reduce dulera to 100 2 bid 07/13/2015      Vision changes 06/25/2015   Vitamin D deficiency 08/28/2014    Tobacco History: Social History   Tobacco Use  Smoking Status Never  Smokeless Tobacco Never   Counseling given: Not Answered   Outpatient Medications Prior to Visit  Medication Sig Dispense Refill   albuterol (PROVENTIL) (2.5 MG/3ML) 0.083% nebulizer solution Take 3 mLs (2.5 mg total) by nebulization every 6 (six) hours as needed for wheezing or shortness of breath. 75 mL 5   albuterol (VENTOLIN HFA) 108 (90 Base) MCG/ACT inhaler Inhale 2 puffs into the lungs every 6 (six) hours as needed for wheezing or shortness of breath. 8 g 6   amoxicillin-clavulanate (AUGMENTIN) 875-125 MG tablet Take 1 tablet by mouth 2 (two) times daily. 14 tablet 0   aspirin EC 81 MG tablet Take 1 tablet (81 mg total) by mouth daily. 90 tablet 3   BD INSULIN SYRINGE U-500 31G X 6MM 0.5 ML MISC U UTD TID SUBCUTANEOUSLY     benzonatate (TESSALON)  200 MG capsule Take 1 capsule (200 mg total) by mouth 3 (three) times daily as needed for cough. 30 capsule 1   Blood Glucose Monitoring Suppl (ACCU-CHEK AVIVA PLUS) W/DEVICE KIT Used as directed 1 kit 0   Buprenorphine HCl (BELBUCA) 450 MCG FILM Place inside cheek.     cabergoline (DOSTINEX) 0.5 MG tablet Take 0.5 tablets (0.25 mg total) by mouth 2 (two) times a week. 10 tablet 3   calcium carbonate (OS-CAL) 600 MG TABS tablet Take 1 tablet by mouth daily.     cholecalciferol (VITAMIN D) 1000 UNITS tablet Take 1,000 Units by mouth every morning.     cloNIDine (CATAPRES) 0.2 MG tablet Take 1 tablet (0.2 mg total) by mouth 2 (two) times daily. 180 tablet 3   dexlansoprazole (DEXILANT) 60 MG capsule Take 60 mg by mouth daily.     dupilumab (DUPIXENT) 300 MG/2ML  prefilled syringe Inject 300 mg into the skin every 14 (fourteen) days. 12 mL 1   folic acid (FOLVITE) 681 MCG tablet Take 400 mcg by mouth daily.     furosemide (LASIX) 20 MG tablet Take 1 tablet (20 mg total) by mouth every morning. 30 tablet 2   gabapentin (NEURONTIN) 400 MG capsule Take 2 capsules (800 mg total) by mouth 3 (three) times daily. 180 capsule 3   glucose blood (RELION GLUCOSE TEST STRIPS) test strip 1 each by Other route 4 (four) times daily. ICD code E11.65 400 each 3   hydrOXYzine (ATARAX/VISTARIL) 25 MG tablet Take 25 mg by mouth at bedtime as needed.     insulin aspart (NOVOLOG) 100 UNIT/ML FlexPen Inject 12 Units into the skin 3 (three) times daily.     Lancet Devices (ACCU-CHEK SOFTCLIX) lancets Use as instructed 1 each 0   levothyroxine (SYNTHROID, LEVOTHROID) 75 MCG tablet Take 112 mcg by mouth daily before breakfast.      linaclotide (LINZESS) 145 MCG CAPS capsule Take 145 mcg by mouth daily before breakfast.     losartan (COZAAR) 100 MG tablet Take 1 tablet (100 mg total) by mouth every morning. 30 tablet 6   methocarbamol (ROBAXIN) 750 MG tablet Take 1 tablet (750 mg total) by mouth 3 (three) times daily. MUST MAKE APPT FOR FURTHER REFILLS 60 tablet 0   methotrexate (RHEUMATREX) 2.5 MG tablet Take 2.5 mg by mouth once a week. 8 pills wkly .Caution:Chemotherapy. Protect from light.      metoprolol tartrate (LOPRESSOR) 100 MG tablet TAKE 1 TAB AN HOUR BEFORE CT 1 tablet 0   Misc. Devices (CANE) MISC 1 each by Does not apply route daily. 1 each 0   montelukast (SINGULAIR) 10 MG tablet Take 1 tablet (10 mg total) by mouth at bedtime. 90 tablet 3   PARoxetine (PAXIL) 20 MG tablet Take 1 tablet (20 mg total) by mouth daily. MUST MAKE APPT FOR FURTHER REFILLS 30 tablet 1   predniSONE (DELTASONE) 10 MG tablet 4 tabs for 3 days, then 3 tabs for 3 days, 2 tabs for 3 days, then 1 tab for 3 days, then stop 30 tablet 0   promethazine (PHENERGAN) 25 MG tablet Take 1 tablet (25 mg  total) by mouth every 6 (six) hours as needed for nausea or vomiting. 15 tablet 0   rosuvastatin (CRESTOR) 20 MG tablet Take 1 tablet (20 mg total) by mouth daily. 90 tablet 3   spironolactone (ALDACTONE) 25 MG tablet Take 25 mg by mouth daily.     terbinafine (LAMISIL) 250 MG tablet Take 1 tablet (250  mg total) by mouth daily. 90 tablet 0   Tiotropium Bromide Monohydrate (SPIRIVA RESPIMAT) 1.25 MCG/ACT AERS INHALE 2 SPRAY(S) BY MOUTH ONCE DAILY 12 g 3   TOUJEO MAX SOLOSTAR 300 UNIT/ML Solostar Pen SMARTSIG:60 Unit(s) SUB-Q Every Night     traMADol (ULTRAM) 50 MG tablet Take by mouth every 6 (six) hours as needed.     traZODone (DESYREL) 50 MG tablet Take 50 mg by mouth at bedtime.     verapamil (CALAN-SR) 180 MG CR tablet Take 2 tablets (360 mg total) by mouth at bedtime. 180 tablet 2   levofloxacin (LEVAQUIN) 500 MG tablet Take 1 tablet (500 mg total) by mouth daily for 7 days. 7 tablet 0   mometasone-formoterol (DULERA) 200-5 MCG/ACT AERO Inhale 2 puffs into the lungs 2 (two) times daily. 3 each 3   nitroGLYCERIN (NITROSTAT) 0.4 MG SL tablet Place 1 tablet (0.4 mg total) under the tongue every 5 (five) minutes as needed. 25 tablet 3   Facility-Administered Medications Prior to Visit  Medication Dose Route Frequency Provider Last Rate Last Admin   betamethasone acetate-betamethasone sodium phosphate (CELESTONE) injection 3 mg  3 mg Intramuscular Once Daylene Katayama M, DPM       DOBUTamine (DOBUTREX) 1,000 mcg/mL in dextrose 5% 250 mL infusion  30 mcg/kg/min Intravenous Continuous Croitoru, Mihai, MD 145.3 mL/hr at 04/22/15 1300 30 mcg/kg/min at 04/22/15 1300     Review of Systems:   Constitutional: No weight loss or gain, night sweats, fevers, chills. +fatigue, lassitude (improving) HEENT: No headaches, difficulty swallowing, tooth/dental problems, or sore throat. No sneezing, itching, ear ache, nasal congestion, or post nasal drip CV:  No chest pain, PND, orthopnea, swelling in lower  extremities, anasarca, dizziness, palpitations, syncope Resp: +shortness of breath with exertion (baseline); productive cough (improving). No hemoptysis. No wheezing. No chest wall deformity GI:  No heartburn, indigestion, abdominal pain, nausea, vomiting, diarrhea, change in bowel habits, loss of appetite, bloody stools.  MSK:  No joint pain or swelling.  No decreased range of motion.  No back pain. Neuro: No dizziness or lightheadedness.  Psych: No depression or anxiety. Mood stable.     Physical Exam:  BP 124/74   Pulse 94   Ht _0  (1.473 m)   Wt 195 lb (88.5 kg)   LMP 07/20/2018   SpO2 99%   BMI 40.76 kg/m   GEN: Pleasant, interactive, well-developed; obese; in no acute distress. HEENT:  Normocephalic and atraumatic. PERRLA. Sclera white. Nasal turbinates pink, moist and patent bilaterally. No rhinorrhea present. Oropharynx pink and moist, without exudate or edema. No lesions, ulcerations, or postnasal drip.  NECK:  Supple w/ fair ROM. No JVD present. Normal carotid impulses w/o bruits. Thyroid symmetrical with no goiter or nodules palpated. No lymphadenopathy.   CV: RRR, no m/r/g, no peripheral edema. Pulses intact, +2 bilaterally. No cyanosis, pallor or clubbing. PULMONARY:  Unlabored, regular breathing. Clear bilaterally A&P w/ wheezes/rales/rhonchi. No accessory muscle use.  GI: BS present and normoactive. Soft, non-tender to palpation. No organomegaly or masses detected.  MSK: No erythema, warmth or tenderness. No deformities or joint swelling noted.  Neuro: A/Ox3. No focal deficits noted.   Skin: Warm, no lesions or rashe Psych: Normal affect and behavior. Judgement and thought content appropriate.     Lab Results:  CBC    Component Value Date/Time   WBC 7.5 01/27/2022 1116   RBC 4.36 01/27/2022 1116   HGB 12.9 01/27/2022 1116   HGB 11.0 (L) 09/16/2019 0847   HGB 11.8  04/15/2019 1231   HCT 38.5 01/27/2022 1116   HCT 36.4 04/15/2019 1231   PLT 293 01/27/2022  1116   PLT 398 09/16/2019 0847   PLT 451 (H) 04/15/2019 1231   MCV 88.3 01/27/2022 1116   MCV 84 04/15/2019 1231   MCH 29.6 01/27/2022 1116   MCHC 33.5 01/27/2022 1116   RDW 13.9 01/27/2022 1116   RDW 14.5 04/15/2019 1231   LYMPHSABS 2.1 01/27/2022 1116   LYMPHSABS 3.8 (H) 05/09/2018 1126   MONOABS 0.9 01/27/2022 1116   EOSABS 0.3 01/27/2022 1116   EOSABS 0.4 05/09/2018 1126   BASOSABS 0.1 01/27/2022 1116   BASOSABS 0.1 05/09/2018 1126    BMET    Component Value Date/Time   NA 139 09/11/2021 1901   NA 140 07/27/2021 1232   K 4.4 09/11/2021 1901   CL 103 09/11/2021 1901   CO2 29 09/11/2021 1901   GLUCOSE 80 09/11/2021 1901   BUN 14 09/11/2021 1901   BUN 13 07/27/2021 1232   CREATININE 0.84 09/11/2021 1901   CREATININE 1.09 (H) 07/23/2018 1026   CREATININE 0.84 11/07/2016 1641   CALCIUM 9.6 09/11/2021 1901   GFRNONAA >60 09/11/2021 1901   GFRNONAA 60 (L) 07/23/2018 1026   GFRNONAA 85 11/07/2016 1641   GFRAA 100 04/06/2020 1410   GFRAA >60 07/23/2018 1026   GFRAA >89 11/07/2016 1641    BNP    Component Value Date/Time   BNP 53.3 09/08/2014 2213     Imaging:  DG Chest 2 View  Result Date: 07/11/2022 CLINICAL DATA:  recent CAP; worsening productive cough post abx EXAM: CHEST - 2 VIEW COMPARISON:  Radiograph 06/28/2022 FINDINGS: Unchanged cardiomediastinal silhouette. Mildly increased, faint right basilar airspace opacity. No pleural effusion. No pneumothorax. Multiple chronic midthoracic compression deformities. IMPRESSION: Mildly increased, faint right basilar airspace opacities, suspicious for pneumonia. Electronically Signed   By: Maurine Simmering M.D.   On: 07/11/2022 11:22   DG Chest 2 View  Result Date: 06/28/2022 CLINICAL DATA:  chest congestion; productive cough; asthma exacerbation EXAM: CHEST - 2 VIEW COMPARISON:  None Available. FINDINGS: Mildly increased conspicuity of streaky opacities in the mid to basilar lungs. No confluent consolidation. No visible  pleural effusions or pneumothorax. Cardiomediastinal silhouette is unchanged. No acute osseous abnormality. IMPRESSION: Mildly increased conspicuity of streaky opacities in the mid to basilar lungs, which could represent atelectasis/scar or early pneumonia. Electronically Signed   By: Margaretha Sheffield M.D.   On: 06/28/2022 12:21    ipratropium-albuterol (DUONEB) 0.5-2.5 (3) MG/3ML nebulizer solution 3 mL     Date Action Dose Route User   06/28/2022 1208 Given 3 mL Nebulization Smith, April L, RN      methylPREDNISolone acetate (DEPO-MEDROL) injection 80 mg     Date Action Dose Route User   06/28/2022 1207 Given 80 mg Intramuscular (Left Ventrogluteal) Monna Fam L, RN          Latest Ref Rng & Units 02/01/2018    9:54 AM 11/03/2014    9:38 AM  PFT Results  FVC-Pre L 2.03  1.84   FVC-Predicted Pre % 78  70   FVC-Post L 1.99  1.83   FVC-Predicted Post % 76  70   Pre FEV1/FVC % % 81  83   Post FEV1/FCV % % 87  85   FEV1-Pre L 1.65  1.52   FEV1-Predicted Pre % 78  71   FEV1-Post L 1.73  1.56   DLCO uncorrected ml/min/mmHg 15.96  19.95   DLCO UNC% % 131  164   DLVA Predicted % 169  201   TLC L 3.83  3.47   TLC % Predicted % 103  93   RV % Predicted % 139  107     Lab Results  Component Value Date   NITRICOXIDE 16 07/05/2016        Assessment & Plan:   CAP (community acquired pneumonia) Clinically improved. Completed levaquin 7 day course yesterday. Sputum culture was unable to be processed due to oral sections. Given her improvement, no need to attempt to recollect today. Close monitoring of symptoms. Repeat CXR in 6 weeks to ensure resolution.   Patient Instructions  Continue Albuterol inhaler 2 puffs or 3 mL neb every 6 hours as needed for shortness of breath or wheezing. Notify if symptoms persist despite rescue inhaler/neb use. Use neb twice daily  Continue Dulera 2 puffs Twice daily. Brush tongue and rinse mouth afterwards Continue Spiriva 2 puffs daily   Continue Dupixent injections every 14 days Continue montelukast (singulair) 1 tab At bedtime  Continue Mucinex [id number] mg Twice daily for congestion Continue Flutter valve 2-3 times a day after nebulizer treatment    Follow up in six weeks with repeat CXR with Dr. Loanne Drilling (1st) or Katie Lakethia Coppess,NP. If symptoms do not improve or worsen, please contact office for sooner follow up or seek emergency care.    Severe asthma without complication Severe asthma with resolved exacerbation. See above. She will continue triple therapy regimen with Dulera and Spiriva.    I spent 28 minutes of dedicated to the care of this patient on the date of this encounter to include pre-visit review of records, face-to-face time with the patient discussing conditions above, post visit ordering of testing, clinical documentation with the electronic health record, making appropriate referrals as documented, and communicating necessary findings to members of the patients care team.  Clayton Bibles, NP 07/18/2022  Pt aware and understands NP's role.

## 2022-08-01 ENCOUNTER — Other Ambulatory Visit: Payer: Self-pay | Admitting: Pharmacist

## 2022-08-01 DIAGNOSIS — J4550 Severe persistent asthma, uncomplicated: Secondary | ICD-10-CM

## 2022-08-01 MED ORDER — DUPIXENT 300 MG/2ML ~~LOC~~ SOSY: 300.0000 mg | PREFILLED_SYRINGE | SUBCUTANEOUS | 1 refills | Status: DC

## 2022-08-01 NOTE — Telephone Encounter (Signed)
Refill sent for [place] to [facility]: [phone]  Dose: 300 mg every 2 weeks  Last OV: 07/18/22 Provider: Dr. Loanne Drilling  Next OV: 08/29/22  Knox Saliva, PharmD, MPH, BCPS Clinical Pharmacist (Rheumatology and Pulmonology)

## 2022-08-16 ENCOUNTER — Telehealth: Payer: Self-pay | Admitting: Pulmonary Disease

## 2022-08-16 DIAGNOSIS — J4550 Severe persistent asthma, uncomplicated: Secondary | ICD-10-CM

## 2022-08-16 MED ORDER — MONTELUKAST SODIUM 10 MG PO TABS: 10.0000 mg | ORAL_TABLET | Freq: Every day | ORAL | 3 refills | Status: DC

## 2022-08-16 NOTE — Telephone Encounter (Signed)
Patient is requesting a refill for her montelukast (SINGULAIR) 10 MG tablet .  She is completely out of meds.  Please send to [facility] at [address]

## 2022-08-16 NOTE — Telephone Encounter (Signed)
Called and spoke with patient. Patient stated she needed a refill on her singulair. Patient verified pharmacy. Rx has been sent to the patients pharmacy.   Nothing further needed.

## 2022-08-29 ENCOUNTER — Encounter (HOSPITAL_BASED_OUTPATIENT_CLINIC_OR_DEPARTMENT_OTHER): Payer: Self-pay | Admitting: Pulmonary Disease

## 2022-08-29 ENCOUNTER — Ambulatory Visit (INDEPENDENT_AMBULATORY_CARE_PROVIDER_SITE_OTHER): Payer: Medicaid Other | Admitting: Pulmonary Disease

## 2022-08-29 VITALS — BP 112/78 | HR 73 | Ht <= 58 in | Wt 198.0 lb

## 2022-08-29 DIAGNOSIS — J4550 Severe persistent asthma, uncomplicated: Secondary | ICD-10-CM

## 2022-08-29 MED ORDER — ALBUTEROL SULFATE HFA 108 (90 BASE) MCG/ACT IN AERS
2.0000 | INHALATION_SPRAY | Freq: Four times a day (QID) | RESPIRATORY_TRACT | 2 refills | Status: DC | PRN
Start: 2022-08-29 — End: 2023-10-05

## 2022-08-29 MED ORDER — DULERA 200-5 MCG/ACT IN AERO
2.0000 | INHALATION_SPRAY | Freq: Two times a day (BID) | RESPIRATORY_TRACT | 3 refills | Status: DC
Start: 2022-08-29 — End: 2023-12-18

## 2022-08-29 MED ORDER — SPIRIVA RESPIMAT 1.25 MCG/ACT IN AERS: INHALATION_SPRAY | RESPIRATORY_TRACT | 3 refills | Status: DC

## 2022-08-29 NOTE — Progress Notes (Signed)
Subjective:   PATIENT ID: Raven Turner GENDER: female DOB: 26-Nov-1970, MRN: [id number]   HPI  Chief Complaint  Patient presents with   Follow-up    Had pneumonia in oct and seen at [address]     Reason for Visit: Follow-up   Raven Turner is a 51 year-old Spanish speaking female never smoker who presents follow-up of severe persistent asthma. Video translator is present  Synopsis: For management of her asthma she is on [place] and [place] and Spiriva. When she lived in [place], she stated she "lived" in the hospital. Last hospitalization in 2015. No further hospitalization since starting Dupixent. Weaned off of chronic steroids  04/23/21 Overall she feels she is doing well. She has more good days than bad days. Heat will worsen her shortness of breath. She has had to use her albuterol more frequently due to shortness of breath related to the weather three weeks ago but doing better this week, using albuterol only twice. Denies cough, wheezing or recent illness. Currently on home Dupixent and states she rotate sites.  11/16/21 Since our last visit she reports that overall she is well-controlled on Dupixent. She is compliant with her Spiriva and Dulera except when she ran out of refills and had to be without for awhile. She usually needs albuterol once a week but in the last weekend had to use the nebulizer and handheld multiple times.   08/29/22 Her asthma symptoms are overall well controlled. In 2022 she has had no exacerbations and until the fall of 2023, she has not needed prednisone or antibiotics. In October she had multiple exacerbations requiring prolonged steroid taper. She is feeling improved but has noticed that she is having occasional upper airway wheeze and taking her albuterol more often in the last day  2022 Jan Feb Mar April May June July Aug Sept Oct Nov Dec                2023 Jan Feb Mar April May June July Aug Sept Oct Nov Dec             XX    2024 Jan Feb Mar April May June July Aug Sept Oct Nov Dec                   Asthma Control Test ACT Total Score  08/29/2022 11:22 AM 16  07/11/2022 10:49 AM 10  11/16/2021  3:40 PM 17    Past Medical History:  Diagnosis Date   Anemia    Asthma    Bilateral hand numbness 06/25/2015   Bone pain 02/27/2015   Diffuse bone pain in legs mostly but also in arms     Breast pain, left 03/27/2015   Callus of heel 12/16/2014   Compression fracture    Compression fracture of L1 lumbar vertebra ([place]) 08/26/2014   Compression fracture of T12 vertebra ([place]) 08/28/2014   COPD (chronic obstructive pulmonary disease) ([place]) 2013   Cushing syndrome ([place]) 2013    Depression    Diabetes ([place]) 2013    Diabetes mellitus type 2, uncontrolled 08/26/2014   Sees Dr. Katy Fitch.  Last visit 11/27/14.  No diabetic retinopathy     Diabetes mellitus with neuropathy (HCC)    Diastolic dysfunction without heart failure 10/14/2014   Grade 2 diastolic dysfunction 2/2 pulm HTN    Generalized anxiety disorder 03/27/2015   HTN (hypertension) 2005    Hx of cataract surgery 11/03/2014   LUQ abdominal pain 07/17/2015  Medication management 05/12/2015   Morbid obesity ([place]) 09/08/2014   MRSA colonization 12/15/2014   Non-English speaking patient 09/08/2014   OSA (obstructive sleep apnea) 05/20/2015   Osteopenia 11/19/2013   Dx with osteopenia in lumbar vertebra with partial compression of L1   Pain due to onychomycosis of toenail 03/27/2015   Secondary Cushing's syndrome/ iatrogenic    Skin rash 07/17/2015   Tachycardia 01/13/2015   Upper airway cough syndrome 09/05/2014   Followed in Pulmonary clinic/ [facility]/ [place]  - Trial off advair      09/05/2014 >>>  - trial off theoph 09/17/2014 and added flutter  - rec reduce dulera to 100 2 bid 07/13/2015      Vision changes 06/25/2015   Vitamin D deficiency 08/28/2014     Allergies  Allergen Reactions   Hydrocortisone Rash and Hives   Shellfish Allergy Anaphylaxis  and Hives   Sulfamethoxazole-Trimethoprim Rash, Other (See Comments) and Itching    Facial redness with pruritus  Facial redness with pruritus      Outpatient Medications Prior to Visit  Medication Sig Dispense Refill   albuterol (PROVENTIL) (2.5 MG/3ML) 0.083% nebulizer solution Take 3 mLs (2.5 mg total) by nebulization every 6 (six) hours as needed for wheezing or shortness of breath. 75 mL 5   aspirin EC 81 MG tablet Take 1 tablet (81 mg total) by mouth daily. 90 tablet 3   BD INSULIN SYRINGE U-500 31G X 6MM 0.5 ML MISC U UTD TID SUBCUTANEOUSLY     benzonatate (TESSALON) 200 MG capsule Take 1 capsule (200 mg total) by mouth 3 (three) times daily as needed for cough. 30 capsule 1   Blood Glucose Monitoring Suppl (ACCU-CHEK AVIVA PLUS) W/DEVICE KIT Used as directed 1 kit 0   Buprenorphine HCl (BELBUCA) 450 MCG FILM Place inside cheek.     cabergoline (DOSTINEX) 0.5 MG tablet Take 0.5 tablets (0.25 mg total) by mouth 2 (two) times a week. 10 tablet 3   calcium carbonate (OS-CAL) 600 MG TABS tablet Take 1 tablet by mouth daily.     cholecalciferol (VITAMIN D) 1000 UNITS tablet Take 1,000 Units by mouth every morning.     cloNIDine (CATAPRES) 0.2 MG tablet Take 1 tablet (0.2 mg total) by mouth 2 (two) times daily. 180 tablet 3   dexlansoprazole (DEXILANT) 60 MG capsule Take 60 mg by mouth daily.     dupilumab (DUPIXENT) 300 MG/2ML prefilled syringe Inject 300 mg into the skin every 14 (fourteen) days. 12 mL 1   ENBREL 50 MG/ML injection SMARTSIG:1 Milliliter(s) SUB-Q Once a Week     folic acid (FOLVITE) 976 MCG tablet Take 400 mcg by mouth daily.     furosemide (LASIX) 20 MG tablet Take 1 tablet (20 mg total) by mouth every morning. 30 tablet 2   gabapentin (NEURONTIN) 400 MG capsule Take 2 capsules (800 mg total) by mouth 3 (three) times daily. 180 capsule 3   glucose blood (RELION GLUCOSE TEST STRIPS) test strip 1 each by Other route 4 (four) times daily. ICD code E11.65 400 each 3    hydrOXYzine (ATARAX/VISTARIL) 25 MG tablet Take 25 mg by mouth at bedtime as needed.     insulin aspart (NOVOLOG) 100 UNIT/ML FlexPen Inject 12 Units into the skin 3 (three) times daily.     Lancet Devices (ACCU-CHEK SOFTCLIX) lancets Use as instructed 1 each 0   levothyroxine (SYNTHROID, LEVOTHROID) 75 MCG tablet Take 112 mcg by mouth daily before breakfast.      linaclotide Rolan Lipa)  145 MCG CAPS capsule Take 145 mcg by mouth daily before breakfast.     losartan (COZAAR) 100 MG tablet Take 1 tablet (100 mg total) by mouth every morning. 30 tablet 6   methocarbamol (ROBAXIN) 750 MG tablet Take 1 tablet (750 mg total) by mouth 3 (three) times daily. MUST MAKE APPT FOR FURTHER REFILLS 60 tablet 0   methotrexate (RHEUMATREX) 2.5 MG tablet Take 2.5 mg by mouth once a week. 8 pills wkly .Caution:Chemotherapy. Protect from light.      metoprolol tartrate (LOPRESSOR) 100 MG tablet TAKE 1 TAB AN HOUR BEFORE CT 1 tablet 0   Misc. Devices (CANE) MISC 1 each by Does not apply route daily. 1 each 0   montelukast (SINGULAIR) 10 MG tablet Take 1 tablet (10 mg total) by mouth at bedtime. 90 tablet 3   PARoxetine (PAXIL) 20 MG tablet Take 1 tablet (20 mg total) by mouth daily. MUST MAKE APPT FOR FURTHER REFILLS 30 tablet 1   promethazine (PHENERGAN) 25 MG tablet Take 1 tablet (25 mg total) by mouth every 6 (six) hours as needed for nausea or vomiting. 15 tablet 0   rosuvastatin (CRESTOR) 20 MG tablet Take 1 tablet (20 mg total) by mouth daily. 90 tablet 3   spironolactone (ALDACTONE) 25 MG tablet Take 25 mg by mouth daily.     terbinafine (LAMISIL) 250 MG tablet Take 1 tablet (250 mg total) by mouth daily. 90 tablet 0   TOUJEO MAX SOLOSTAR 300 UNIT/ML Solostar Pen SMARTSIG:60 Unit(s) SUB-Q Every Night     traMADol (ULTRAM) 50 MG tablet Take by mouth every 6 (six) hours as needed.     traZODone (DESYREL) 50 MG tablet Take 50 mg by mouth at bedtime.     verapamil (CALAN-SR) 180 MG CR tablet Take 2 tablets (360 mg  total) by mouth at bedtime. 180 tablet 2   albuterol (VENTOLIN HFA) 108 (90 Base) MCG/ACT inhaler Inhale 2 puffs into the lungs every 6 (six) hours as needed for wheezing or shortness of breath. 8 g 6   amoxicillin-clavulanate (AUGMENTIN) 875-125 MG tablet Take 1 tablet by mouth 2 (two) times daily. 14 tablet 0   predniSONE (DELTASONE) 10 MG tablet 4 tabs for 3 days, then 3 tabs for 3 days, 2 tabs for 3 days, then 1 tab for 3 days, then stop 30 tablet 0   Tiotropium Bromide Monohydrate (SPIRIVA RESPIMAT) 1.25 MCG/ACT AERS INHALE 2 SPRAY(S) BY MOUTH ONCE DAILY 12 g 3   nitroGLYCERIN (NITROSTAT) 0.4 MG SL tablet Place 1 tablet (0.4 mg total) under the tongue every 5 (five) minutes as needed. 25 tablet 3   mometasone-formoterol (DULERA) 200-5 MCG/ACT AERO Inhale 2 puffs into the lungs 2 (two) times daily. 3 each 3   Facility-Administered Medications Prior to Visit  Medication Dose Route Frequency Provider Last Rate Last Admin   betamethasone acetate-betamethasone sodium phosphate (CELESTONE) injection 3 mg  3 mg Intramuscular Once Daylene Katayama M, DPM       DOBUTamine (DOBUTREX) 1,000 mcg/mL in dextrose 5% 250 mL infusion  30 mcg/kg/min Intravenous Continuous Croitoru, Mihai, MD 145.3 mL/hr at 04/22/15 1300 30 mcg/kg/min at 04/22/15 1300    Review of Systems  Constitutional:  Negative for chills, diaphoresis, fever, malaise/fatigue and weight loss.  HENT:  Negative for congestion.   Respiratory:  Positive for wheezing. Negative for cough, hemoptysis, sputum production and shortness of breath.   Cardiovascular:  Negative for chest pain, palpitations and leg swelling.     Objective:  Vitals:   08/29/22 1121  BP: 112/78  Pulse: 73  SpO2: 97%  Weight: 198 lb (89.8 kg)  Height: _0  (1.473 m)   SpO2: 97 % O2 Device: None (Room air)  Physical Exam: General: Well-appearing, no acute distress HENT: [place], AT, upper airway wheeze Eyes: EOMI, no scleral icterus Respiratory: Clear to  auscultation bilaterally.  No crackles, wheezing or rales Cardiovascular: RRR, -M/R/G, no JVD Extremities:-Edema,-tenderness Neuro: AAO x4, CNII-XII grossly intact Psych: Normal mood, normal affect  Data Reviewed:  Imaging: CT Chest 11/25/18 - Mild air trapping. Normal parenchyma CXR 06/28/22 - Streaky opacities in RML 07/11/22 - Improved/resolving opacities  PFT: 09/02/19 FVC 1.99 (76%) FEV1 1.73 (81%) Ratio 81  TLC 103% DLCO 131%. No significant bronchodilator effect. Interpretation: No obstructive defect or restrictive defect seen on spirometry. Elevated DLCO can be seen in asthma, obesity and large lung volumes.  Labs: Abs eos 03/17/20 - 400    Assessment & Plan:   Discussion: 51 year old female with severe persistent asthma, previously on chronic prednisone who presents for follow-up. Well controlled until 06/2022 requiring multiple prednisone tapers and treated for pneumonia. Improved however does have upper airway wheeze. Discussed that if she continues to have exacerbations, may need to consider transitioning to different biologic  Severe persistent asthma  CONTINUE Dulera 200-5 TWO puffs TWICE a day CONTINUE Spiriva 1.25 mcg TWO puffs ONCE a day CONTINUE Albuterol as needed for shortness of breath or wheezing CONTINUE Singulair ONCE a day CONTINUE Dupixent as scheduled  Asthma Action Plan Increase Albuterol as needed for worsening shortness of breath, wheezing and cough. If you symptoms do not improve in 24-48 hours, please our office for evaluation and/or prednisone taper.  Health Maintenance Immunization History  Administered Date(s) Administered   Hepatitis B 03/23/2015, 07/06/2015   Hepatitis B, PED/ADOLESCENT 03/23/2015, 07/06/2015   Influenza Split 05/25/2015   Influenza,inj,Quad PF,6+ Mos 05/13/2016, 05/29/2017, 07/13/2018, 06/03/2019, 06/17/2020   Influenza-Unspecified 05/25/2015, 05/13/2016, 05/29/2017, 07/13/2018, 06/03/2019   PFIZER(Purple Top)SARS-COV-2  Vaccination 12/02/2019, 12/23/2019   Pneumococcal Polysaccharide-23 07/31/2017   Pneumococcal-Unspecified 04/12/2012   Tdap 06/24/2015    No orders of the defined types were placed in this encounter.   Meds ordered this encounter  Medications   Tiotropium Bromide Monohydrate (SPIRIVA RESPIMAT) 1.25 MCG/ACT AERS    Sig: INHALE 2 SPRAY(S) BY MOUTH ONCE DAILY    Dispense:  12 g    Refill:  3   mometasone-formoterol (DULERA) 200-5 MCG/ACT AERO    Sig: Inhale 2 puffs into the lungs 2 (two) times daily.    Dispense:  3 each    Refill:  3   albuterol (VENTOLIN HFA) 108 (90 Base) MCG/ACT inhaler    Sig: Inhale 2 puffs into the lungs every 6 (six) hours as needed for wheezing or shortness of breath.    Dispense:  8 g    Refill:  2    Please RX proair.    Return in about 3 months (around 11/28/2022).  I have spent a total time of 32-minutes on the day of the appointment including chart review, data review, collecting history, coordinating care and discussing medical diagnosis and plan with the patient/family. Past medical history, allergies, medications were reviewed. Pertinent imaging, labs and tests included in this note have been reviewed and interpreted independently by me.  [place], MD [facility] 08/29/2022 11:50 AM  Office Number [phone]

## 2022-08-29 NOTE — Patient Instructions (Signed)
Severe persistent asthma  CONTINUE Dulera 200-5 TWO puffs TWICE a day CONTINUE Spiriva 1.25 mcg TWO puffs ONCE a day CONTINUE Albuterol as needed for shortness of breath or wheezing CONTINUE Singulair ONCE a day CONTINUE Dupixent as scheduled  Follow-up with me in 3 months or sooner if symptoms worsen

## 2022-09-26 ENCOUNTER — Telehealth: Payer: Self-pay | Admitting: Pulmonary Disease

## 2022-09-26 DIAGNOSIS — J4550 Severe persistent asthma, uncomplicated: Secondary | ICD-10-CM

## 2022-09-28 ENCOUNTER — Other Ambulatory Visit (HOSPITAL_COMMUNITY): Payer: Self-pay

## 2022-09-28 NOTE — Telephone Encounter (Signed)
Patient appears to have new insurance. Received fax from [place] stating it is through [place]. Submitted a Prior Authorization request to [facility] for [place] via CoverMyMeds. Will update once we receive a response.  Key: Ardelle Balls, PharmD, MPH, BCPS, CPP Clinical Pharmacist (Rheumatology and Pulmonology)

## 2022-09-29 MED ORDER — DUPIXENT 300 MG/2ML ~~LOC~~ SOSY: 300.0000 mg | PREFILLED_SYRINGE | SUBCUTANEOUS | 1 refills | Status: DC

## 2022-09-29 NOTE — Telephone Encounter (Signed)
Received notification from [facility] regarding a prior authorization for [place]. Authorization has been APPROVED from 09/28/22 to 03/27/23. Approval letter sent to scan center.  Apparently [facility] does not fill Dupixent as of September 2023. They have transferred all prescriptions to [facility]. Called pt using interpreter and shipment has been scheduled to home.  Authorization # [id number]  Knox Saliva, PharmD, MPH, BCPS, CPP Clinical Pharmacist (Rheumatology and Pulmonology)

## 2022-11-28 ENCOUNTER — Ambulatory Visit (HOSPITAL_BASED_OUTPATIENT_CLINIC_OR_DEPARTMENT_OTHER): Payer: Medicaid Other | Admitting: Pulmonary Disease

## 2023-01-02 ENCOUNTER — Telehealth: Payer: Self-pay | Admitting: Nurse Practitioner

## 2023-01-02 NOTE — Telephone Encounter (Signed)
Mychart msg sent to patient to schedule PCP apt. AS,CMA

## 2023-03-06 ENCOUNTER — Telehealth: Payer: Self-pay | Admitting: Pulmonary Disease

## 2023-03-06 DIAGNOSIS — J4550 Severe persistent asthma, uncomplicated: Secondary | ICD-10-CM

## 2023-03-10 NOTE — Telephone Encounter (Signed)
Patient overdue for f/u appt. Refill for Dupixent sent for 2 months only to [facility]  Patient was no-show to appt in March. Routing to scheduling team  Chesley Mires, PharmD, MPH, BCPS, CPP Clinical Pharmacist (Rheumatology and Pulmonology)

## 2023-03-22 ENCOUNTER — Encounter (HOSPITAL_BASED_OUTPATIENT_CLINIC_OR_DEPARTMENT_OTHER): Payer: Self-pay | Admitting: Pulmonary Disease

## 2023-06-22 ENCOUNTER — Encounter (HOSPITAL_BASED_OUTPATIENT_CLINIC_OR_DEPARTMENT_OTHER): Payer: Self-pay | Admitting: Pulmonary Disease

## 2023-06-22 ENCOUNTER — Ambulatory Visit (HOSPITAL_BASED_OUTPATIENT_CLINIC_OR_DEPARTMENT_OTHER): Payer: Commercial Managed Care - HMO | Admitting: Pulmonary Disease

## 2023-10-05 ENCOUNTER — Telehealth: Payer: Self-pay | Admitting: Pharmacist

## 2023-10-05 ENCOUNTER — Other Ambulatory Visit (HOSPITAL_COMMUNITY): Payer: Self-pay

## 2023-10-05 ENCOUNTER — Encounter (HOSPITAL_BASED_OUTPATIENT_CLINIC_OR_DEPARTMENT_OTHER): Payer: Self-pay | Admitting: Pulmonary Disease

## 2023-10-05 ENCOUNTER — Ambulatory Visit (HOSPITAL_BASED_OUTPATIENT_CLINIC_OR_DEPARTMENT_OTHER): Payer: Commercial Managed Care - HMO | Admitting: Pulmonary Disease

## 2023-10-05 VITALS — BP 108/72 | HR 80 | Resp 16 | Ht <= 58 in | Wt 182.8 lb

## 2023-10-05 DIAGNOSIS — J4551 Severe persistent asthma with (acute) exacerbation: Secondary | ICD-10-CM

## 2023-10-05 DIAGNOSIS — J4550 Severe persistent asthma, uncomplicated: Secondary | ICD-10-CM

## 2023-10-05 LAB — CBC WITH DIFFERENTIAL/PLATELET
Basophils Absolute: 0.1 10*3/uL (ref 0.0–0.2)
Basos: 1 %
EOS (ABSOLUTE): 0.9 10*3/uL — ABNORMAL HIGH (ref 0.0–0.4)
Eos: 13 %
Hematocrit: 37.5 % (ref 34.0–46.6)
Hemoglobin: 12.7 g/dL (ref 11.1–15.9)
Immature Grans (Abs): 0 10*3/uL (ref 0.0–0.1)
Immature Granulocytes: 0 %
Lymphocytes Absolute: 2.9 10*3/uL (ref 0.7–3.1)
Lymphs: 40 %
MCH: 28.2 pg (ref 26.6–33.0)
MCHC: 33.9 g/dL (ref 31.5–35.7)
MCV: 83 fL (ref 79–97)
Monocytes Absolute: 0.8 10*3/uL (ref 0.1–0.9)
Monocytes: 11 %
Neutrophils Absolute: 2.5 10*3/uL (ref 1.4–7.0)
Neutrophils: 35 %
Platelets: 308 10*3/uL (ref 150–450)
RBC: 4.51 x10E6/uL (ref 3.77–5.28)
RDW: 12.3 % (ref 11.7–15.4)
WBC: 7.2 10*3/uL (ref 3.4–10.8)

## 2023-10-05 MED ORDER — MONTELUKAST SODIUM 10 MG PO TABS: 10.0000 mg | ORAL_TABLET | Freq: Every day | ORAL | 3 refills | Status: AC

## 2023-10-05 MED ORDER — SPIRIVA RESPIMAT 1.25 MCG/ACT IN AERS: INHALATION_SPRAY | RESPIRATORY_TRACT | 3 refills | Status: DC

## 2023-10-05 MED ORDER — FLUTICASONE-SALMETEROL 250-50 MCG/ACT IN AEPB
1.0000 | INHALATION_SPRAY | Freq: Two times a day (BID) | RESPIRATORY_TRACT | 11 refills | Status: AC
Start: 2023-10-05 — End: ?

## 2023-10-05 MED ORDER — PREDNISONE 10 MG PO TABS: ORAL_TABLET | ORAL | 0 refills | Status: AC

## 2023-10-05 MED ORDER — ALBUTEROL SULFATE HFA 108 (90 BASE) MCG/ACT IN AERS
2.0000 | INHALATION_SPRAY | Freq: Four times a day (QID) | RESPIRATORY_TRACT | 6 refills | Status: DC | PRN
Start: 2023-10-05 — End: 2023-10-16

## 2023-10-05 NOTE — Patient Instructions (Signed)
  Severe persistent asthma with exacerbation Prednisone taper START [place] 250-50 ONE puf TWICE a day CONTINUE Spiriva 1.25 mcg TWO puffs ONCE a day CONTINUE Albuterol as needed for shortness of breath or wheezing CONTINUE Singulair ONCE a day RESTART enrollment with Dupixent ORDER pulmonary function tests ORDER labs: CBC with diff, IgE  Asthma Action Plan Increase Albuterol as needed for worsening shortness of breath, wheezing and cough. If you symptoms do not improve in 24-48 hours, please our office for evaluation and/or prednisone taper.

## 2023-10-05 NOTE — Progress Notes (Signed)
Subjective:   PATIENT ID: Raven Turner GENDER: female DOB: 02/21/1971, MRN: [id number]   HPI  Chief Complaint  Patient presents with   Follow-up    Reason for Visit: Follow-up   [name] is a 53 year-old Spanish speaking female never smoker who presents follow-up of severe persistent asthma. Video translator is present  Synopsis: For management of her asthma she is on Dupixent and [place] and Spiriva. When she lived in [place] she stated she "lived" in the hospital. Last hospitalization in 2015. No further hospitalization since starting Dupixent. Weaned off of chronic steroids  04/23/21 Overall she feels she is doing well. She has more good days than bad days. Heat will worsen her shortness of breath. She has had to use her albuterol more frequently due to shortness of breath related to the weather three weeks ago but doing better this week, using albuterol only twice. Denies cough, wheezing or recent illness. Currently on home Dupixent and states she rotate sites.  11/16/21 Since our last visit she reports that overall she is well-controlled on Dupixent. She is compliant with her Spiriva and Dulera except when she ran out of refills and had to be without for awhile. She usually needs albuterol once a week but in the last weekend had to use the nebulizer and handheld multiple times.   08/29/22 Her asthma symptoms are overall well controlled. In 2022 she has had no exacerbations and until the fall of 2023, she has not needed prednisone or antibiotics. In October she had multiple exacerbations requiring prolonged steroid taper. She is feeling improved but has noticed that she is having occasional upper airway wheeze and taking her albuterol more often in the last day  10/05/23 Since our last visit she had lost her Medicaid and has reapplied for new insurance. She tried to extend her Dupixent by taking it monthly or when she felt sick with last dose about 1.5  months ago. She has run out at this point. She is still taking Dulera, Spiriva and ventolin. Uses ventolin daily 1-3 times a day.  She is having wheezing cough and shortness of breath. Using nebulizer. Rarely waking up in the nights due to her asthma. Has not sough out treatment for her exacerbations due to lack of insurance.  2022 Jan Feb Mar April May June July Aug Sept Oct Nov Dec                2023 Jan Feb Mar April May June July Aug Sept Oct Nov Dec            XX    2024 Jan Feb Mar April May June July Aug Sept Oct Nov Dec                   Asthma Control Test ACT Total Score  10/05/2023  9:58 AM 15  08/29/2022 11:22 AM 16  07/11/2022 10:49 AM 10    Past Medical History:  Diagnosis Date   Anemia    Asthma    Bilateral hand numbness 06/25/2015   Bone pain 02/27/2015   Diffuse bone pain in legs mostly but also in arms     Breast pain, left 03/27/2015   Callus of heel 12/16/2014   Compression fracture    Compression fracture of L1 lumbar vertebra (HCC) 08/26/2014   Compression fracture of T12 vertebra (HCC) 08/28/2014   COPD (chronic obstructive pulmonary disease) (HCC) 2013   Cushing syndrome (HCC) 2013  Depression    Diabetes (HCC) 2013    Diabetes mellitus type 2, uncontrolled 08/26/2014   Sees Dr. Dione Booze.  Last visit 11/27/14.  No diabetic retinopathy     Diabetes mellitus with neuropathy (HCC)    Diastolic dysfunction without heart failure 10/14/2014   Grade 2 diastolic dysfunction 2/2 pulm HTN    Generalized anxiety disorder 03/27/2015   HTN (hypertension) 2005    Hx of cataract surgery 11/03/2014   LUQ abdominal pain 07/17/2015   Medication management 05/12/2015   Morbid obesity (HCC) 09/08/2014   MRSA colonization 12/15/2014   Non-English speaking patient 09/08/2014   OSA (obstructive sleep apnea) 05/20/2015   Osteopenia 11/19/2013   Dx with osteopenia in lumbar vertebra with partial compression of L1   Pain due to onychomycosis of toenail 03/27/2015   Secondary  Cushing's syndrome/ iatrogenic    Skin rash 07/17/2015   Tachycardia 01/13/2015   Upper airway cough syndrome 09/05/2014   Followed in Pulmonary clinic/ [facility]/ [place]  - Trial off advair      09/05/2014 >>>  - trial off theoph 09/17/2014 and added flutter  - rec reduce dulera to 100 2 bid 07/13/2015      Vision changes 06/25/2015   Vitamin D deficiency 08/28/2014     Allergies  Allergen Reactions   Hydrocortisone Rash and Hives   Shellfish Allergy Anaphylaxis and Hives   Sulfamethoxazole-Trimethoprim Rash, Other (See Comments) and Itching    Facial redness with pruritus  Facial redness with pruritus      Outpatient Medications Prior to Visit  Medication Sig Dispense Refill   albuterol (PROVENTIL) (2.5 MG/3ML) 0.083% nebulizer solution Take 3 mLs (2.5 mg total) by nebulization every 6 (six) hours as needed for wheezing or shortness of breath. 75 mL 5   albuterol (VENTOLIN HFA) 108 (90 Base) MCG/ACT inhaler Inhale 2 puffs into the lungs every 6 (six) hours as needed for wheezing or shortness of breath. 8 g 2   aspirin EC 81 MG tablet Take 1 tablet (81 mg total) by mouth daily. 90 tablet 3   BD INSULIN SYRINGE U-500 31G X 0.5 ML MISC U UTD TID SUBCUTANEOUSLY     benzonatate (TESSALON) 200 MG capsule Take 1 capsule (200 mg total) by mouth 3 (three) times daily as needed for cough. 30 capsule 1   Blood Glucose Monitoring Suppl (ACCU-CHEK AVIVA PLUS) W/DEVICE KIT Used as directed 1 kit 0   Buprenorphine HCl (BELBUCA) 450 MCG FILM Place inside cheek.     cabergoline (DOSTINEX) 0.5 MG tablet Take 0.5 tablets (0.25 mg total) by mouth 2 (two) times a week. 10 tablet 3   calcium carbonate (OS-CAL) 600 MG TABS tablet Take 1 tablet by mouth daily.     cholecalciferol (VITAMIN D) 1000 UNITS tablet Take 1,000 Units by mouth every morning.     cloNIDine (CATAPRES) 0.2 MG tablet Take 1 tablet (0.2 mg total) by mouth 2 (two) times daily. 180 tablet 3   dexlansoprazole (DEXILANT) 60 MG  capsule Take 60 mg by mouth daily.     dupilumab (DUPIXENT) 300 MG/2ML prefilled syringe INJECT 1 SYRINGE UNDER THE SKIN EVERY 14 DAYS 4 mL 1   ENBREL 50 MG/ML injection SMARTSIG:1 Milliliter(s) SUB-Q Once a Week     folic acid (FOLVITE) 400 MCG tablet Take 400 mcg by mouth daily.     furosemide (LASIX) 20 MG tablet Take 1 tablet (20 mg total) by mouth every morning. 30 tablet 2   gabapentin (NEURONTIN) 400 MG capsule  Take 2 capsules (800 mg total) by mouth 3 (three) times daily. 180 capsule 3   glucose blood (RELION GLUCOSE TEST STRIPS) test strip 1 each by Other route 4 (four) times daily. ICD code E11.65 400 each 3   hydrOXYzine (ATARAX/VISTARIL) 25 MG tablet Take 25 mg by mouth at bedtime as needed.     insulin aspart (NOVOLOG) 100 UNIT/ML FlexPen Inject 12 Units into the skin 3 (three) times daily.     Lancet Devices (ACCU-CHEK SOFTCLIX) lancets Use as instructed 1 each 0   levothyroxine (SYNTHROID, LEVOTHROID) 75 MCG tablet Take 112 mcg by mouth daily before breakfast.      linaclotide (LINZESS) 145 MCG CAPS capsule Take 145 mcg by mouth daily before breakfast.     losartan (COZAAR) 100 MG tablet Take 1 tablet (100 mg total) by mouth every morning. 30 tablet 6   methocarbamol (ROBAXIN) 750 MG tablet Take 1 tablet (750 mg total) by mouth 3 (three) times daily. MUST MAKE APPT FOR FURTHER REFILLS 60 tablet 0   methotrexate (RHEUMATREX) 2.5 MG tablet Take 2.5 mg by mouth once a week. 8 pills wkly .Caution:Chemotherapy. Protect from light.      metoprolol tartrate (LOPRESSOR) 100 MG tablet TAKE 1 TAB AN HOUR BEFORE CT 1 tablet 0   Misc. Devices (CANE) MISC 1 each by Does not apply route daily. 1 each 0   montelukast (SINGULAIR) 10 MG tablet Take 1 tablet (10 mg total) by mouth at bedtime. 90 tablet 3   PARoxetine (PAXIL) 20 MG tablet Take 1 tablet (20 mg total) by mouth daily. MUST MAKE APPT FOR FURTHER REFILLS 30 tablet 1   promethazine (PHENERGAN) 25 MG tablet Take 1 tablet (25 mg total) by  mouth every 6 (six) hours as needed for nausea or vomiting. 15 tablet 0   rosuvastatin (CRESTOR) 20 MG tablet Take 1 tablet (20 mg total) by mouth daily. 90 tablet 3   spironolactone (ALDACTONE) 25 MG tablet Take 25 mg by mouth daily.     terbinafine (LAMISIL) 250 MG tablet Take 1 tablet (250 mg total) by mouth daily. 90 tablet 0   Tiotropium Bromide Monohydrate (SPIRIVA RESPIMAT) 1.25 MCG/ACT AERS INHALE 2 SPRAY(S) BY MOUTH ONCE DAILY 12 g 3   TOUJEO MAX SOLOSTAR 300 UNIT/ML Solostar Pen SMARTSIG:60 Unit(s) SUB-Q Every Night     traMADol (ULTRAM) 50 MG tablet Take by mouth every 6 (six) hours as needed.     traZODone (DESYREL) 50 MG tablet Take 50 mg by mouth at bedtime.     verapamil (CALAN-SR) 180 MG CR tablet Take 2 tablets (360 mg total) by mouth at bedtime. 180 tablet 2   mometasone-formoterol (DULERA) 200-5 MCG/ACT AERO Inhale 2 puffs into the lungs 2 (two) times daily. 3 each 3   nitroGLYCERIN (NITROSTAT) 0.4 MG SL tablet Place 1 tablet (0.4 mg total) under the tongue every 5 (five) minutes as needed. 25 tablet 3   Facility-Administered Medications Prior to Visit  Medication Dose Route Frequency Provider Last Rate Last Admin   betamethasone acetate-betamethasone sodium phosphate (CELESTONE) injection 3 mg  3 mg Intramuscular Once Gala Lewandowsky M, DPM       DOBUTamine (DOBUTREX) 1,000 mcg/mL in dextrose 5% 250 mL infusion  30 mcg/kg/min Intravenous Continuous Croitoru, Mihai, MD 145.3 mL/hr at 04/22/15 1300 30 mcg/kg/min at 04/22/15 1300    Review of Systems  Constitutional:  Negative for chills, diaphoresis, fever, malaise/fatigue and weight loss.  HENT:  Negative for congestion.   Respiratory:  Positive for  cough, sputum production, shortness of breath and wheezing. Negative for hemoptysis.   Cardiovascular:  Negative for chest pain, palpitations and leg swelling.     Objective:   Vitals:   10/05/23 0958  BP: 108/72  Pulse: 80  Resp: 16  SpO2: 97%  Weight: 182 lb 12.8 oz  (82.9 kg)  Height: 4\' 10"  (1.473 m)    SpO2: 97 %  Physical Exam: General: Well-appearing, no acute distress HENT: [place], AT Eyes: EOMI, no scleral icterus Respiratory: Dimisnied to auscultation bilaterally.  No crackles, wheezing or rales Cardiovascular: RRR, -M/R/G, no JVD Extremities:-Edema,-tenderness Neuro: AAO x4, CNII-XII grossly intact Psych: Normal mood, normal affect  Data Reviewed:  Imaging: CT Chest 11/25/18 - Mild air trapping. Normal parenchyma CXR 06/28/22 - Streaky opacities in RML 07/11/22 - Improved/resolving opacities  PFT: 09/02/19 FVC 1.99 (76%) FEV1 1.73 (81%) Ratio 81  TLC 103% DLCO 131%. No significant bronchodilator effect. Interpretation: No obstructive defect or restrictive defect seen on spirometry. Elevated DLCO can be seen in asthma, obesity and large lung volumes.  Labs: Abs eos 03/17/20 - 400    Assessment & Plan:   Discussion: 53 year old female with severe persistent asthma, previously on chronic prednisone who presents for follow-up. Well-controlled until 06/2022 requiring multiple prednisone tapers and treated for pneumonia. After losing her insurance in July 2024, she has been unable to seek medical care and has uncontrolled asthma symptoms with untreated exacerbations in the last 6 months. Symptomatic and currently in exacerbation today. Now has [employer] and ready to restart her asthma meds  Severe persistent asthma with exacerbation Prednisone taper START Wixela 250-50 ONE puf TWICE a day CONTINUE Spiriva 1.25 mcg TWO puffs ONCE a day CONTINUE Albuterol as needed for shortness of breath or wheezing CONTINUE Singulair ONCE a day RESTART enrollment with Dupixent ORDER pulmonary function tests ORDER labs: CBC with diff, IgE  Asthma Action Plan Increase Albuterol as needed for worsening shortness of breath, wheezing and cough. If you symptoms do not improve in 24-48 hours, please our office for evaluation and/or prednisone taper.  Health  Maintenance Immunization History  Administered Date(s) Administered   Hepatitis B 03/23/2015, 07/06/2015   Hepatitis B, PED/ADOLESCENT 03/23/2015, 07/06/2015   Influenza Split 05/25/2015   Influenza,inj,Quad PF,6+ Mos 05/13/2016, 05/29/2017, 07/13/2018, 06/03/2019, 06/17/2020   Influenza-Unspecified 05/25/2015, 05/13/2016, 05/29/2017, 07/13/2018, 06/03/2019   PFIZER(Purple Top)SARS-COV-2 Vaccination 12/02/2019, 12/23/2019   Pneumococcal Polysaccharide-23 07/31/2017   Pneumococcal-Unspecified 04/12/2012   Tdap 06/24/2015    Orders Placed This Encounter  Procedures   CBC with Differential   IgE    Standing Status:   Future    Expiration Date:   10/04/2024   Pulmonary function test    Standing Status:   Future    Expiration Date:   10/04/2024    Where should this test be performed?:   [facility]    Full PFT: includes the following: basic spirometry, spirometry pre & post bronchodilator, diffusion capacity (DLCO), lung volumes:   Full PFT    Meds ordered this encounter  Medications   predniSONE (DELTASONE) 10 MG tablet    Sig: Take 4 tablets (40 mg total) by mouth daily with breakfast for 2 days, THEN 3 tablets (30 mg total) daily with breakfast for 2 days, THEN 2 tablets (20 mg total) daily with breakfast for 2 days, THEN 1 tablet (10 mg total) daily with breakfast for 2 days.    Dispense:  20 tablet    Refill:  0    Return for after PFT  when next available. OK for separate days.  I have spent a total time of 50-minutes on the day of the appointment including chart review, data review, collecting history, coordinating care and discussing medical diagnosis and plan with the patient/family. Past medical history, allergies, medications were reviewed. Pertinent imaging, labs and tests included in this note have been reviewed and interpreted independently by me.  Dearies Meikle Mechele Collin, MD [facility] 10/05/2023 10:18 AM

## 2023-10-05 NOTE — Telephone Encounter (Signed)
Received incorrect DMW form for restart (appears to be COPD form) via Onbase. Patient receiving PFS previously  Submitted a Prior Authorization request to [employer] for DUPIXENT via CoverMyMeds. Will update once we receive a response.  Key: [id number]   Chesley Mires, PharmD, MPH, BCPS, CPP Clinical Pharmacist (Rheumatology and Pulmonology)

## 2023-10-13 ENCOUNTER — Other Ambulatory Visit (HOSPITAL_COMMUNITY): Payer: Self-pay

## 2023-10-13 NOTE — Telephone Encounter (Signed)
Received notification from [employer] regarding a prior authorization for DUPIXENT. Authorization has been APPROVED from 10/05/23 to 04/02/24.  Per test claim, copay for 28 days supply is $7414.59    Patient can continue to fill through [facility]: [phone]  Authorization # [id number]  Chesley Mires, PharmD, MPH, BCPS, CPP Clinical Pharmacist (Rheumatology and Pulmonology)

## 2023-10-16 ENCOUNTER — Other Ambulatory Visit: Payer: Self-pay | Admitting: Pharmacist

## 2023-10-16 MED ORDER — DUPIXENT 300 MG/2ML ~~LOC~~ SOSY: 300.0000 mg | PREFILLED_SYRINGE | SUBCUTANEOUS | 1 refills | Status: DC

## 2023-10-16 MED ORDER — ALBUTEROL SULFATE HFA 108 (90 BASE) MCG/ACT IN AERS: 2.0000 | INHALATION_SPRAY | Freq: Four times a day (QID) | RESPIRATORY_TRACT | 6 refills | Status: AC | PRN

## 2023-10-16 NOTE — Telephone Encounter (Signed)
Patient requested rx for generic Ventolin since this is cheaper for patient. Rx sent to pharmacy  Chesley Mires, PharmD, MPH, BCPS, CPP Clinical Pharmacist (Rheumatology and Pulmonology)

## 2023-10-16 NOTE — Telephone Encounter (Signed)
Called patient using interpreter, Carlos, #[id number] [facility]  Patient provided with Dupixent MyWay phone nubmer as well as [facility]  DMW: [phone] [facility]: [phone]   Patient advised to call pharmacy first to determine if the copay card is working and covering cost. If not, she will be referred to copay card company to see if eligible for additional debit card.  Chesley Mires, PharmD, MPH, BCPS, CPP Clinical Pharmacist (Rheumatology and Pulmonology)

## 2023-10-20 ENCOUNTER — Telehealth: Payer: Self-pay

## 2023-10-20 ENCOUNTER — Other Ambulatory Visit (HOSPITAL_COMMUNITY): Payer: Self-pay

## 2023-10-20 NOTE — Telephone Encounter (Signed)
 Pharmacy Patient Advocate Encounter   Received notification from CoverMyMeds that prior authorization for Spiriva  Respimat 1.25MCG/ACT aerosol is required/requested.   Insurance verification completed.   The patient is insured through [employer] .   Per test claim: Cancelled. Insurance is stating 'patient inactive'. Please have patient contact her insurance company.

## 2023-10-24 ENCOUNTER — Encounter (HOSPITAL_BASED_OUTPATIENT_CLINIC_OR_DEPARTMENT_OTHER): Payer: Commercial Managed Care - HMO

## 2023-10-24 ENCOUNTER — Encounter (HOSPITAL_BASED_OUTPATIENT_CLINIC_OR_DEPARTMENT_OTHER): Payer: Self-pay | Admitting: Pulmonary Disease

## 2023-10-27 ENCOUNTER — Telehealth: Payer: Self-pay | Admitting: Pharmacist

## 2023-10-27 DIAGNOSIS — J4550 Severe persistent asthma, uncomplicated: Secondary | ICD-10-CM

## 2023-10-27 MED ORDER — DUPIXENT 300 MG/2ML ~~LOC~~ SOSY: 300.0000 mg | PREFILLED_SYRINGE | SUBCUTANEOUS | 1 refills | Status: DC

## 2023-10-27 NOTE — Telephone Encounter (Signed)
Received fax from [facility] stating patient must fill with [facility]  Rx resent today  Chesley Mires, PharmD, MPH, BCPS, CPP Clinical Pharmacist (Rheumatology and Pulmonology)

## 2023-10-30 ENCOUNTER — Ambulatory Visit (HOSPITAL_BASED_OUTPATIENT_CLINIC_OR_DEPARTMENT_OTHER): Payer: Commercial Managed Care - HMO | Admitting: Pulmonary Disease

## 2023-10-31 ENCOUNTER — Other Ambulatory Visit (HOSPITAL_BASED_OUTPATIENT_CLINIC_OR_DEPARTMENT_OTHER): Payer: Self-pay

## 2023-10-31 ENCOUNTER — Encounter (HOSPITAL_BASED_OUTPATIENT_CLINIC_OR_DEPARTMENT_OTHER): Payer: Self-pay | Admitting: Pulmonary Disease

## 2023-10-31 DIAGNOSIS — J4550 Severe persistent asthma, uncomplicated: Secondary | ICD-10-CM

## 2023-10-31 MED ORDER — DUPIXENT 300 MG/2ML ~~LOC~~ SOSY: 300.0000 mg | PREFILLED_SYRINGE | SUBCUTANEOUS | 1 refills | Status: DC

## 2023-11-23 ENCOUNTER — Encounter (HOSPITAL_BASED_OUTPATIENT_CLINIC_OR_DEPARTMENT_OTHER): Payer: Commercial Managed Care - HMO

## 2023-11-24 ENCOUNTER — Other Ambulatory Visit (HOSPITAL_BASED_OUTPATIENT_CLINIC_OR_DEPARTMENT_OTHER): Payer: Self-pay

## 2023-11-28 ENCOUNTER — Ambulatory Visit (HOSPITAL_BASED_OUTPATIENT_CLINIC_OR_DEPARTMENT_OTHER): Admitting: Pulmonary Disease

## 2023-11-28 DIAGNOSIS — J4551 Severe persistent asthma with (acute) exacerbation: Secondary | ICD-10-CM | POA: Diagnosis not present

## 2023-11-28 LAB — PULMONARY FUNCTION TEST
DL/VA % pred: 132 %
DL/VA: 6.11 ml/min/mmHg/L
DLCO cor % pred: 137 %
DLCO cor: 20.75 ml/min/mmHg
DLCO unc % pred: 134 %
DLCO unc: 20.29 ml/min/mmHg
FEF 25-75 Post: 2.01 L/s
FEF 25-75 Pre: 1.42 L/s
FEF2575-%Change-Post: 41 %
FEF2575-%Pred-Post: 89 %
FEF2575-%Pred-Pre: 63 %
FEV1-%Change-Post: 7 %
FEV1-%Pred-Post: 79 %
FEV1-%Pred-Pre: 74 %
FEV1-Post: 1.59 L
FEV1-Pre: 1.47 L
FEV1FVC-%Change-Post: 2 %
FEV1FVC-%Pred-Pre: 101 %
FEV6-%Change-Post: 8 %
FEV6-%Pred-Post: 78 %
FEV6-%Pred-Pre: 72 %
FEV6-Post: 1.92 L
FEV6-Pre: 1.76 L
FEV6FVC-%Change-Post: 3 %
FEV6FVC-%Pred-Post: 102 %
FEV6FVC-%Pred-Pre: 99 %
FVC-%Change-Post: 5 %
FVC-%Pred-Post: 76 %
FVC-%Pred-Pre: 72 %
FVC-Post: 1.92 L
FVC-Pre: 1.82 L
Post FEV1/FVC ratio: 83 %
Post FEV6/FVC ratio: 100 %
Pre FEV1/FVC ratio: 81 %
Pre FEV6/FVC Ratio: 97 %
RV % pred: 134 %
RV: 1.83 L
TLC % pred: 107 %
TLC: 4 L

## 2023-11-28 NOTE — Patient Instructions (Signed)
 Full PFT Performed Today

## 2023-11-28 NOTE — Progress Notes (Signed)
 Full PFT Performed Today

## 2023-12-11 ENCOUNTER — Other Ambulatory Visit (HOSPITAL_BASED_OUTPATIENT_CLINIC_OR_DEPARTMENT_OTHER): Payer: Self-pay

## 2023-12-11 ENCOUNTER — Ambulatory Visit: Payer: Self-pay

## 2023-12-11 DIAGNOSIS — J4551 Severe persistent asthma with (acute) exacerbation: Secondary | ICD-10-CM

## 2023-12-11 MED ORDER — PREDNISONE 10 MG PO TABS: ORAL_TABLET | ORAL | 0 refills | Status: DC

## 2023-12-11 MED ORDER — PREDNISONE 10 MG PO TABS: ORAL_TABLET | ORAL | 0 refills | Status: AC

## 2023-12-11 MED ORDER — ALBUTEROL SULFATE (2.5 MG/3ML) 0.083% IN NEBU: 2.5000 mg | INHALATION_SOLUTION | Freq: Four times a day (QID) | RESPIRATORY_TRACT | 5 refills | Status: AC | PRN

## 2023-12-11 NOTE — Telephone Encounter (Signed)
 Copied from CRM [id number]. Topic: Clinical - Red Word Triage >> Dec 11, 2023  1:50 PM Raven Turner wrote: Red Word that prompted transfer to Nurse Triage: SOB/congestion in lungs   Chief Complaint: Shortness of breath, Congestion down in chest Symptoms: shortness of breath Frequency: 4 days Pertinent Negatives: Patient denies history of a blood clot, coughing up blood, fever, nausea, vomiting, diarrhea, chance of pregnancy,  Disposition: [] ED /[] Urgent Care (no appt availability in office) / [] Appointment(In office/virtual)/ []  [facility]/ [] Home Care/ [x] Refused Recommended Disposition /[] [facility]/ []  Follow-up with PCP Additional Notes: Patient called in and advised that she has an appointment with Pulmonology on 12/18/2023 but states that she needs a sooner appointment because she is short of breath.  She states that she has a lot of chest congestion. Patient states that she is managing her shortness of breath right now with her rescue inhaler.  Patient states that she has a pulmonary function test done and her appointment was 12/18/2023 but she feels like that is too far out. Patient denies any known history of a blood clot, coughing up blood, fever, nausea, vomiting, diarrhea, or a chance of pregnancy. Patient was advised that the recommendation with her having some mild difficulty breathing is that she be seen in the next 4 hours.  There are no available appointments in the office at this time and Urgent Care is offered as a possibility for her to go to.  Patient states that she does not feel like she is bad enough to need to go to Urgent Care but she would like to be seen at the Pulmonologist Office sooner if possible and have her two medications refilled.  Patient appeared to be speaking in full sentences during triage with the Spanish interpreter and no audible wheezing noted during triage. Patient also states that she is trying to get the Dupixent medication sent to her  pharmacy but is having trouble getting that medication. Patient states that she has not tested for Flu or COVID because she states that she doesn't have either of those and she only gets this way when she is out of her medication.  She states that all she has is her rescue inhaler and she is out of her Albuterol for her nebulizer and her Dup ixent.  Patient is advised that if she gets any worse to go to the Emergency Room or call 911 for an ambulance to go to the Emergency Room.  Patient verbalized understanding.   [facility] is patient's pulmonary office. [phone] is the Spanish Interpreter utilized during this call.  Reason for Disposition  [1] MILD difficulty breathing (e.g., minimal/no SOB at rest, SOB with walking, pulse <100) AND [2] NEW-onset or WORSE than normal  Answer Assessment - Initial Assessment Questions E2C2 Pulmonary Triage - Initial Assessment Questions "Chief Complaint (e.g., cough, sob, wheezing, fever, chills, sweat or additional symptoms) *Go to specific symptom protocol after initial questions. Short of breath, chest congestion  "How long have symptoms been present?" 4 days  Have you tested for COVID or Flu? Note: If not, ask patient if a home test can be taken. If so, instruct patient to call back for positive results. "No because I dont have covid or flu I just get this way when I am out of medication"  MEDICINES:   "Have you used any OTC meds to help with symptoms?" Yes If yes, ask "What medications?" No  "Have you used your inhalers/maintenance medication?" Yes If yes, "What medications?"  Albuterol ---pt out of this medication Ventolin  --only has her rescue inhaler Dupixent  ---pt out of this medication  If inhaler, ask "How many puffs and how often?" Note: Review instructions on medication in the chart. Between 3-4 puffs when needed---9 puffs in total  ---This is supposed to be 2 puffs every six hours as needed  OXYGEN: "Do you wear  supplemental oxygen?" No If yes, "How many liters are you supposed to use?" N/a  "Do you monitor your oxygen levels?" No If yes, "What is your reading (oxygen level) today?" N/a  "What is your usual oxygen saturation reading?"  (Note: Pulmonary O2 sats should be 90% or greater) Between 95-97% normally   1. RESPIRATORY STATUS: "Describe your breathing?" (e.g., wheezing, shortness of breath, unable to speak, severe coughing)      Short of breath with a lot of chest congestion 2. ONSET: "When did this breathing problem begin?"      4 days ago 3. PATTERN "Does the difficult breathing come and go, or has it been constant since it started?"      constant 4. SEVERITY: "How bad is your breathing?" (e.g., mild, moderate, severe)    - MILD: No SOB at rest, mild SOB with walking, speaks normally in sentences, can lie down, no retractions, pulse < 100.    - MODERATE: SOB at rest, SOB with minimal exertion and prefers to sit, cannot lie down flat, speaks in phrases, mild retractions, audible wheezing, pulse 100-120.    - SEVERE: Very SOB at rest, speaks in single words, struggling to breathe, sitting hunched forward, retractions, pulse > 120      "Not right now I am controlling the asthma with a rescue inhaler" 5. RECURRENT SYMPTOM: "Have you had difficulty breathing before?" If Yes, ask: "When was the last time?" and "What happened that time?"      Asthma-- 6. CARDIAC HISTORY: "Do you have any history of heart disease?" (e.g., heart attack, angina, bypass surgery, angioplasty)      No 7. LUNG HISTORY: "Do you have any history of lung disease?"  (e.g., pulmonary embolus, asthma, emphysema)     Asthma 8. CAUSE: "What do you think is causing the breathing problem?"      Asthma--out of medication 9. OTHER SYMPTOMS: "Do you have any other symptoms? (e.g., dizziness, runny nose, cough, chest pain, fever)     Chest congestion 10. O2 SATURATION MONITOR:  "Do you use an oxygen saturation monitor (pulse  oximeter) at home?" If Yes, ask: "What is your reading (oxygen level) today?" "What is your usual oxygen saturation reading?" (e.g., 95%)       Usually 95-97%   unable to check right now 11. PREGNANCY: "Is there any chance you are pregnant?" "When was your last menstrual period?"       No 12. TRAVEL: "Have you traveled out of the country in the last month?" (e.g., travel history, exposures)       No  Protocols used: Breathing Difficulty-A-AH

## 2023-12-11 NOTE — Telephone Encounter (Signed)
 Advised pt I sent a refill on her neb to pharmacy and that if things got worse she needed to proceed to urgent care or ER. She verbalized understanding. She states her insurance is no longer covering the Dupixent and her copay is  around $8000

## 2023-12-11 NOTE — Telephone Encounter (Signed)
 I have called patient. Asthma patient previously on Dupixent but currently off due to high co-pay. She is in mild exacerbation with shortness of breath and wheezing and chest congestion. No fevers. Able to speak in full sentences. Has been using rescue inhaler and nebulizer.  Prednisone taper ordered. Advised patient to go to urgent care/ED if respiratory symptoms worsened.

## 2023-12-18 ENCOUNTER — Telehealth: Payer: Self-pay | Admitting: Primary Care

## 2023-12-18 ENCOUNTER — Other Ambulatory Visit (HOSPITAL_COMMUNITY): Payer: Self-pay

## 2023-12-18 ENCOUNTER — Ambulatory Visit (HOSPITAL_BASED_OUTPATIENT_CLINIC_OR_DEPARTMENT_OTHER): Payer: Commercial Managed Care - HMO | Admitting: Pulmonary Disease

## 2023-12-18 ENCOUNTER — Encounter (HOSPITAL_BASED_OUTPATIENT_CLINIC_OR_DEPARTMENT_OTHER): Payer: Self-pay | Admitting: Pulmonary Disease

## 2023-12-18 VITALS — BP 122/76 | HR 65 | Ht <= 58 in | Wt 185.6 lb

## 2023-12-18 DIAGNOSIS — J4551 Severe persistent asthma with (acute) exacerbation: Secondary | ICD-10-CM | POA: Diagnosis not present

## 2023-12-18 MED ORDER — PREDNISONE 10 MG PO TABS: ORAL_TABLET | ORAL | 0 refills | Status: DC

## 2023-12-18 MED ORDER — SPIRIVA RESPIMAT 1.25 MCG/ACT IN AERS: INHALATION_SPRAY | RESPIRATORY_TRACT | 11 refills | Status: DC

## 2023-12-18 MED ORDER — METHYLPREDNISOLONE ACETATE 80 MG/ML IJ SUSP
80.0000 mg | Freq: Once | INTRAMUSCULAR | Status: AC
  Administered 2023-12-18: 80 mg via INTRAMUSCULAR

## 2023-12-18 NOTE — Telephone Encounter (Signed)
 Please see note below, pharmacy requesting clarification   Copied from CRM [id number]. Topic: Clinical - Prescription Issue >> Dec 18, 2023 12:51 PM Para March B wrote: Reason for CRM: [facility] pharmacy regarding prednisone. Scrip says dispense 60... 63 is needed based on directions. Please call back and confirm with [facility] at  [phone].Marland KitchenMarland Kitchen

## 2023-12-18 NOTE — Addendum Note (Signed)
 Addended by: Amada Kingfisher on: 12/18/2023 03:48 PM   Modules accepted: Orders

## 2023-12-18 NOTE — Progress Notes (Signed)
 Subjective:   PATIENT ID: Raven Turner GENDER: female DOB: October 26, 1970, MRN: [id number]   HPI  Chief Complaint  Patient presents with   Follow-up    Asthma    Reason for Visit: Follow-up   [name] is a 53 year-old Spanish speaking female never smoker who presents follow-up of severe persistent asthma. In person translator present  Synopsis: For management of her asthma she is on Dupixent and [place] and Spiriva. When she lived in [place] she stated she "lived" in the hospital. Last hospitalization in 2015. No further hospitalization since starting Dupixent. Weaned off of chronic steroids  04/23/21 Overall she feels she is doing well. She has more good days than bad days. Heat will worsen her shortness of breath. She has had to use her albuterol more frequently due to shortness of breath related to the weather three weeks ago but doing better this week, using albuterol only twice. Denies cough, wheezing or recent illness. Currently on home Dupixent and states she rotate sites.  11/16/21 Since our last visit she reports that overall she is well-controlled on Dupixent. She is compliant with her Spiriva and Dulera except when she ran out of refills and had to be without for awhile. She usually needs albuterol once a week but in the last weekend had to use the nebulizer and handheld multiple times.   08/29/22 Her asthma symptoms are overall well controlled. In 2022 she has had no exacerbations and until the fall of 2023, she has not needed prednisone or antibiotics. In October she had multiple exacerbations requiring prolonged steroid taper. She is feeling improved but has noticed that she is having occasional upper airway wheeze and taking her albuterol more often in the last day  10/05/23 Since our last visit she had lost her Medicaid and has reapplied for new insurance. She tried to extend her Dupixent by taking it monthly or when she felt sick with last  dose about 1.5 months ago. She has run out at this point. She is still taking Dulera, Spiriva and ventolin. Uses ventolin daily 1-3 times a day.  She is having wheezing cough and shortness of breath. Using nebulizer. Rarely waking up in the nights due to her asthma. Has not sough out treatment for her exacerbations due to lack of insurance.  12/18/23 Since our last visit she has been treated for asthma exacerbation in January and March of this year. She has nearly completed her prednisone course but is concerned about worsening wheezing, cough and shortness of breath once she is off steroids again. She has a congested cough with wheezing. She reports being on prednisone is affecting her blood sugars as well. She is using nebulizers up to three times a day.  2022 Jan Feb Mar April May June July Aug Sept Oct Nov Dec                2023 Jan Feb Mar April May June July Aug Sept Oct Nov Dec            XX    2024 Jan Feb Mar April May June July Aug Sept Oct Nov Dec                 2025 Jan Feb Mar April May June July Aug Sept Oct Nov Dec   X  X           2026 Jan Feb Mar April May June July Aug Sept Oct Nov Dec  2027 Jan Feb Mar April May June July Aug Sept Oct Nov Dec                   Asthma Control Test ACT Total Score  10/05/2023  9:58 AM 15  08/29/2022 11:22 AM 16  07/11/2022 10:49 AM 10    Past Medical History:  Diagnosis Date   Anemia    Asthma    Bilateral hand numbness 06/25/2015   Bone pain 02/27/2015   Diffuse bone pain in legs mostly but also in arms     Breast pain, left 03/27/2015   Callus of heel 12/16/2014   Compression fracture    Compression fracture of L1 lumbar vertebra (HCC) 08/26/2014   Compression fracture of T12 vertebra (HCC) 08/28/2014   COPD (chronic obstructive pulmonary disease) (HCC) 2013   Cushing syndrome (HCC) 2013    Depression    Diabetes (HCC) 2013    Diabetes mellitus type 2, uncontrolled 08/26/2014   Sees Dr. Dione Booze.  Last visit  11/27/14.  No diabetic retinopathy     Diabetes mellitus with neuropathy (HCC)    Diastolic dysfunction without heart failure 10/14/2014   Grade 2 diastolic dysfunction 2/2 pulm HTN    Generalized anxiety disorder 03/27/2015   HTN (hypertension) 2005    Hx of cataract surgery 11/03/2014   LUQ abdominal pain 07/17/2015   Medication management 05/12/2015   Morbid obesity (HCC) 09/08/2014   MRSA colonization 12/15/2014   Non-English speaking patient 09/08/2014   OSA (obstructive sleep apnea) 05/20/2015   Osteopenia 11/19/2013   Dx with osteopenia in lumbar vertebra with partial compression of L1   Pain due to onychomycosis of toenail 03/27/2015   Secondary Cushing's syndrome/ iatrogenic    Skin rash 07/17/2015   Tachycardia 01/13/2015   Upper airway cough syndrome 09/05/2014   Followed in Pulmonary clinic/ [facility]/ [place]  - Trial off advair      09/05/2014 >>>  - trial off theoph 09/17/2014 and added flutter  - rec reduce dulera to 100 2 bid 07/13/2015      Vision changes 06/25/2015   Vitamin D deficiency 08/28/2014     Allergies  Allergen Reactions   Hydrocortisone Rash and Hives   Shellfish Allergy Anaphylaxis and Hives   Sulfamethoxazole-Trimethoprim Rash, Other (See Comments) and Itching    Facial redness with pruritus  Facial redness with pruritus      Outpatient Medications Prior to Visit  Medication Sig Dispense Refill   albuterol (PROVENTIL) (2.5 MG/3ML) 0.083% nebulizer solution Take 3 mLs (2.5 mg total) by nebulization every 6 (six) hours as needed for wheezing or shortness of breath. 75 mL 5   albuterol (VENTOLIN HFA) 108 (90 Base) MCG/ACT inhaler Inhale 2 puffs into the lungs every 6 (six) hours as needed for wheezing or shortness of breath. 18 g 6   aspirin EC 81 MG tablet Take 1 tablet (81 mg total) by mouth daily. 90 tablet 3   BD INSULIN SYRINGE U-500 31G X 0.5 ML MISC U UTD TID SUBCUTANEOUSLY     benzonatate (TESSALON) 200 MG capsule Take 1 capsule (200 mg total)  by mouth 3 (three) times daily as needed for cough. 30 capsule 1   Blood Glucose Monitoring Suppl (ACCU-CHEK AVIVA PLUS) W/DEVICE KIT Used as directed 1 kit 0   buprenorphine (BUTRANS) 20 MCG/HR PTWK Place 1 patch onto the skin once a week.     cabergoline (DOSTINEX) 0.5 MG tablet Take 0.5 tablets (0.25 mg total) by mouth 2 (two)  times a week. 10 tablet 3   calcium carbonate (OS-CAL) 600 MG TABS tablet Take 1 tablet by mouth daily.     cholecalciferol (VITAMIN D) 1000 UNITS tablet Take 1,000 Units by mouth every morning.     cloNIDine (CATAPRES) 0.2 MG tablet Take 1 tablet (0.2 mg total) by mouth 2 (two) times daily. 180 tablet 3   cyclobenzaprine (FLEXERIL) 10 MG tablet Take 10 mg by mouth 3 (three) times daily as needed.     dupilumab (DUPIXENT) 300 MG/2ML prefilled syringe Inject 300 mg into the skin every 14 (fourteen) days. 8 mL 1   ENBREL 50 MG/ML injection SMARTSIG:1 Milliliter(s) SUB-Q Once a Week     fluticasone-salmeterol (WIXELA INHUB) 250-50 MCG/ACT AEPB Inhale 1 puff into the lungs in the morning and at bedtime. 60 each 11   folic acid (FOLVITE) 400 MCG tablet Take 400 mcg by mouth daily.     furosemide (LASIX) 20 MG tablet Take 1 tablet (20 mg total) by mouth every morning. 30 tablet 2   gabapentin (NEURONTIN) 400 MG capsule Take 2 capsules (800 mg total) by mouth 3 (three) times daily. 180 capsule 3   glucose blood (RELION GLUCOSE TEST STRIPS) test strip 1 each by Other route 4 (four) times daily. ICD code E11.65 400 each 3   hydrOXYzine (ATARAX/VISTARIL) 25 MG tablet Take 25 mg by mouth at bedtime as needed.     insulin aspart (NOVOLOG) 100 UNIT/ML FlexPen Inject 12 Units into the skin 3 (three) times daily.     Lancet Devices (ACCU-CHEK SOFTCLIX) lancets Use as instructed 1 each 0   levothyroxine (SYNTHROID, LEVOTHROID) 75 MCG tablet Take 112 mcg by mouth daily before breakfast.      linaclotide (LINZESS) 145 MCG CAPS capsule Take 145 mcg by mouth daily before breakfast.      losartan (COZAAR) 100 MG tablet Take 1 tablet (100 mg total) by mouth every morning. 30 tablet 6   methocarbamol (ROBAXIN) 750 MG tablet Take 1 tablet (750 mg total) by mouth 3 (three) times daily. MUST MAKE APPT FOR FURTHER REFILLS 60 tablet 0   methotrexate (RHEUMATREX) 2.5 MG tablet Take 2.5 mg by mouth once a week. 8 pills wkly .Caution:Chemotherapy. Protect from light.      metoprolol tartrate (LOPRESSOR) 100 MG tablet TAKE 1 TAB AN HOUR BEFORE CT 1 tablet 0   Misc. Devices (CANE) MISC 1 each by Does not apply route daily. 1 each 0   montelukast (SINGULAIR) 10 MG tablet Take 1 tablet (10 mg total) by mouth at bedtime. 90 tablet 3   omeprazole (PRILOSEC) 40 MG capsule Take 40 mg by mouth daily.     PARoxetine (PAXIL) 20 MG tablet Take 1 tablet (20 mg total) by mouth daily. MUST MAKE APPT FOR FURTHER REFILLS 30 tablet 1   predniSONE (DELTASONE) 10 MG tablet Take 4 tablets (40 mg total) by mouth daily with breakfast for 2 days, THEN 3 tablets (30 mg total) daily with breakfast for 2 days, THEN 2 tablets (20 mg total) daily with breakfast for 2 days, THEN 1 tablet (10 mg total) daily with breakfast for 2 days. 20 tablet 0   promethazine (PHENERGAN) 25 MG tablet Take 1 tablet (25 mg total) by mouth every 6 (six) hours as needed for nausea or vomiting. 15 tablet 0   rosuvastatin (CRESTOR) 20 MG tablet Take 1 tablet (20 mg total) by mouth daily. 90 tablet 3   spironolactone (ALDACTONE) 25 MG tablet Take 25 mg by mouth daily.  terbinafine (LAMISIL) 250 MG tablet Take 1 tablet (250 mg total) by mouth daily. 90 tablet 0   TOUJEO MAX SOLOSTAR 300 UNIT/ML Solostar Pen SMARTSIG:60 Unit(s) SUB-Q Every Night     traMADol (ULTRAM) 50 MG tablet Take by mouth every 6 (six) hours as needed.     traZODone (DESYREL) 50 MG tablet Take 50 mg by mouth at bedtime.     verapamil (CALAN-SR) 180 MG CR tablet Take 2 tablets (360 mg total) by mouth at bedtime. 180 tablet 2   Tiotropium Bromide Monohydrate (SPIRIVA  RESPIMAT) 1.25 MCG/ACT AERS INHALE 2 SPRAY(S) BY MOUTH ONCE DAILY 12 g 3   Buprenorphine HCl (BELBUCA) 450 MCG FILM Place inside cheek. (Patient not taking: Reported on 12/18/2023)     dexlansoprazole (DEXILANT) 60 MG capsule Take 60 mg by mouth daily. (Patient not taking: Reported on 12/18/2023)     nitroGLYCERIN (NITROSTAT) 0.4 MG SL tablet Place 1 tablet (0.4 mg total) under the tongue every 5 (five) minutes as needed. 25 tablet 3   mometasone-formoterol (DULERA) 200-5 MCG/ACT AERO Inhale 2 puffs into the lungs 2 (two) times daily. 3 each 3   Facility-Administered Medications Prior to Visit  Medication Dose Route Frequency Provider Last Rate Last Admin   betamethasone acetate-betamethasone sodium phosphate (CELESTONE) injection 3 mg  3 mg Intramuscular Once Gala Lewandowsky M, DPM       DOBUTamine (DOBUTREX) 1,000 mcg/mL in dextrose 5% 250 mL infusion  30 mcg/kg/min Intravenous Continuous Croitoru, Mihai, MD 145.3 mL/hr at 04/22/15 1300 30 mcg/kg/min at 04/22/15 1300    Review of Systems  Constitutional:  Negative for chills, diaphoresis, fever, malaise/fatigue and weight loss.  HENT:  Negative for congestion.   Respiratory:  Negative for cough, hemoptysis, sputum production, shortness of breath and wheezing.   Cardiovascular:  Negative for chest pain, palpitations and leg swelling.     Objective:   Vitals:   12/18/23 1132  BP: 122/76  Pulse: 65  SpO2: 97%  Weight: 185 lb 9.6 oz (84.2 kg)  Height: 4\' 7"  (1.397 m)    SpO2: 97 %  Physical Exam: General: Well-appearing, no acute distress HENT: [place], AT Eyes: EOMI, no scleral icterus Respiratory: Clear to auscultation bilaterally.  No crackles, wheezing or rales Cardiovascular: RRR, -M/R/G, no JVD Extremities:-Edema,-tenderness Neuro: AAO x4, CNII-XII grossly intact Psych: Normal mood, normal affect   Data Reviewed:  Imaging: CT Chest 11/25/18 - Mild air trapping. Normal parenchyma CXR 06/28/22 - Streaky opacities in RML 07/11/22  - Improved/resolving opacities  PFT: 09/02/19 FVC 1.99 (76%) FEV1 1.73 (81%) Ratio 81  TLC 103% DLCO 131%. No significant bronchodilator effect. Interpretation: No obstructive defect or restrictive defect seen on spirometry. Elevated DLCO can be seen in asthma, obesity and large lung volumes.  11/28/23 FVC 1.92 (76%) FEV1 1.59 (79%) Ratio 83  TLC 107% DLCO 134%. No significant bronchodilator response. Interpretation: No obstructive or restrictive defect. FVC and FEV1 reduced. Elevated DLCO can be seen in asthma, obesity.  Labs: Abs eos  03/17/20 - 400 10/05/23 - 900    Assessment & Plan:   Discussion: 53 year old female with severe persistent asthma, previously on chronic prednisone who presents for follow-up. Well controlled until 06/2022 requiring multiple prednisone tapers and treated for pneumonia. After losing her insurance in July 2024, she has been unable to seek medical care and has uncontrolled asthma symptoms with untreated exacerbations in the last 6 months. Currently in exacerbation today. Pharmacy has enrolled patient Dupixent again since she has new insurance with [place].  Addressed questions and concerns regarding Dupixent coverage which patient had concerns about affording. Discussed care and coordinated care with pharmacy team.  Severe persistent eosinophilic asthma  Currently in asthma exacerbation Prednisone injection and taper CONTINUE Wixela 250-50 ONE puf TWICE a day CONTINUE Spiriva 1.25 mcg TWO puffs ONCE a day CONTINUE Albuterol as needed for shortness of breath or wheezing CONTINUE Singulair ONCE a day Please contact Dupixent copay card company Reviewed pulmonary function tests with elevated DLCO consistent with asthma  Asthma Action Plan Increase Albuterol as needed for worsening shortness of breath, wheezing and cough. If you symptoms do not improve in 24-48 hours, please our office for evaluation and/or prednisone taper.  Health Maintenance Immunization  History  Administered Date(s) Administered   Hepatitis B 03/23/2015, 07/06/2015   Hepatitis B, PED/ADOLESCENT 03/23/2015, 07/06/2015   Influenza Split 05/25/2015   Influenza,inj,Quad PF,6+ Mos 05/13/2016, 05/29/2017, 07/13/2018, 06/03/2019, 06/17/2020   Influenza-Unspecified 05/25/2015, 05/13/2016, 05/29/2017, 07/13/2018, 06/03/2019   PFIZER(Purple Top)SARS-COV-2 Vaccination 12/02/2019, 12/23/2019   Pneumococcal Polysaccharide-23 07/31/2017   Pneumococcal-Unspecified 04/12/2012   Tdap 06/24/2015    No orders of the defined types were placed in this encounter.   Meds ordered this encounter  Medications   predniSONE (DELTASONE) 10 MG tablet    Sig: Take 6 tablets x three days (60 mg), followed by 5 tablets x three days (50mg ), then 4 tablets x three day(40mg ), then 3 tablets (30mg ) x three days, then 2 tablets (20mg ) x three days, then 1 tablet (10mg ) x three days, then STOP    Dispense:  60 tablet    Refill:  0   Tiotropium Bromide Monohydrate (SPIRIVA RESPIMAT) 1.25 MCG/ACT AERS    Sig: INHALE 2 SPRAY(S) BY MOUTH ONCE DAILY    Dispense:  4 g    Refill:  11    Return in about 3 months (around 03/18/2024) for with NP.  I have spent a total time of 45-minutes on the day of the appointment including chart review, data review, collecting history, coordinating care and discussing medical diagnosis and plan with the patient/family. Past medical history, allergies, medications were reviewed. Pertinent imaging, labs and tests included in this note have been reviewed and interpreted independently by me.  Jameriah Trotti Mechele Collin, MD [facility] 12/18/2023 2:22 PM

## 2023-12-18 NOTE — Patient Instructions (Addendum)
 Severe persistent eosinophilic asthma  CONTINUE Wixela 250-50 ONE puf TWICE a day CONTINUE Spiriva 1.25 mcg TWO puffs ONCE a day CONTINUE Albuterol as needed for shortness of breath or wheezing CONTINUE Singulair ONCE a day Please contact Dupixent copay card company Reviewed pulmonary function tests with elevated DLCO consistent with asthma  Dupixent copay card company  [phone]

## 2023-12-18 NOTE — Telephone Encounter (Signed)
 Spoke with Raven Turner regarding prior message . Patient is supposed to get QTY of 63 pills instead of 60 for prednisone taper.  Raven Turner's voice was understanding.Nothing lese further needed.

## 2023-12-20 ENCOUNTER — Telehealth (HOSPITAL_BASED_OUTPATIENT_CLINIC_OR_DEPARTMENT_OTHER): Payer: Self-pay | Admitting: Pulmonary Disease

## 2023-12-20 MED ORDER — INCRUSE ELLIPTA 62.5 MCG/ACT IN AEPB: 1.0000 | INHALATION_SPRAY | Freq: Every day | RESPIRATORY_TRACT | 5 refills | Status: DC

## 2023-12-20 NOTE — Addendum Note (Signed)
 Addended by: Luciano Cutter on: 12/20/2023 05:42 PM   Modules accepted: Orders

## 2023-12-20 NOTE — Telephone Encounter (Signed)
 Spiriva not on formulary. Will change to Incruse ONE puff once daily.  Please call patient (will need Spanish interpretor).

## 2023-12-21 NOTE — Telephone Encounter (Signed)
 Pt.notified

## 2024-01-08 ENCOUNTER — Telehealth (HOSPITAL_BASED_OUTPATIENT_CLINIC_OR_DEPARTMENT_OTHER): Payer: Self-pay

## 2024-01-08 NOTE — Telephone Encounter (Signed)
 Left pt message on VM to notify

## 2024-01-08 NOTE — Telephone Encounter (Signed)
 Prednisone  is safe to take while on Dupixent  at the same time.  I recommend taking the Dupixent  and completing the prednisone  taper as directed.   Please call patient with the above recommendations.

## 2024-01-08 NOTE — Telephone Encounter (Signed)
 Please advise   Copied from CRM [id number]. Topic: Clinical - Medical Advice >> Jan 08, 2024 10:59 AM Ilene Malling wrote: Reason for CRM: Interpreter Ardean Kohl [phone] for patient. Patient states just received dupilumab  (DUPIXENT ) 300 MG/2ML injection and is asking can take it with Prednisone  or does she need to finish Prednisone  first and then start Dupixent ? Patient pharmacist advised patient to contact the office for advisement, please call back [phone].

## 2024-01-11 ENCOUNTER — Other Ambulatory Visit: Payer: Self-pay | Admitting: Pharmacist

## 2024-01-11 DIAGNOSIS — J4550 Severe persistent asthma, uncomplicated: Secondary | ICD-10-CM

## 2024-01-11 MED ORDER — DUPIXENT 300 MG/2ML ~~LOC~~ SOSY: 300.0000 mg | PREFILLED_SYRINGE | SUBCUTANEOUS | 1 refills | Status: DC

## 2024-01-11 NOTE — Telephone Encounter (Signed)
 Refill sent for DUPIXENT  to [facility]: [phone]  Dose: 300mg  subcut every 14 days  Last OV: 12/18/2023 Provider: Dr. Washington Hacker  Next OV: 03/21/2024  Geraldene Kleine, PharmD, MPH, BCPS Clinical Pharmacist (Rheumatology and Pulmonology)

## 2024-03-20 ENCOUNTER — Telehealth: Payer: Self-pay | Admitting: Pharmacist

## 2024-03-20 NOTE — Telephone Encounter (Signed)
 Submitted an URGENT Prior Authorization RENEWAL request to [employer] for DUPIXENT  via CoverMyMeds. Will update once we receive a response.  Key: [id number]

## 2024-03-20 NOTE — Progress Notes (Deleted)
 @Patient  ID: Raven Turner, female    DOB: 03-23-1971, 53 y.o.   MRN: [id number]  No chief complaint on file.   Referring provider: Leron Millman, NP  HPI: 53 year old female, never smoked. PMH significant for HTN, OSA, CAP, severe persistent asthma, type 2 diabetes, NASH, hypothyroidism, steroid induced osteoporosis, chronic pain, diastolic dysfunction, obesity.   Synopsis: For management of her asthma she is on Dupixent  and Dulera  and Spiriva . When she lived in [place] she stated she lived in the hospital. Last hospitalization in 2015. No further hospitalization since starting Dupixent . Weaned off of chronic steroids  Previous [facility] pulmonary encounter:  04/23/21 Overall she feels she is doing well. She has more good days than bad days. Heat will worsen her shortness of breath. She has had to use her albuterol  more frequently due to shortness of breath related to the weather three weeks ago but doing better this week, using albuterol  only twice. Denies cough, wheezing or recent illness. Currently on home Dupixent  and states she rotate sites.   11/16/21 Since our last visit she reports that overall she is well-controlled on Dupixent . She is compliant with her Spiriva  and Dulera  except when she ran out of refills and had to be without for awhile. She usually needs albuterol  once a week but in the last weekend had to use the nebulizer and handheld multiple times.    08/29/22 Her asthma symptoms are overall well controlled. In 2022 she has had no exacerbations and until the fall of 2023, she has not needed prednisone  or antibiotics. In October she had multiple exacerbations requiring prolonged steroid taper. She is feeling improved but has noticed that she is having occasional upper airway wheeze and taking her albuterol  more often in the last day   10/05/23 Since our last visit she had lost her Medicaid and has reapplied for new insurance. She tried to extend her Dupixent  by taking  it monthly or when she felt sick with last dose about 1.5 months ago. She has run out at this point. She is still taking Dulera , Spiriva  and ventolin . Uses ventolin  daily 1-3 times a day.  She is having wheezing cough and shortness of breath. Using nebulizer. Rarely waking up in the nights due to her asthma. Has not sough out treatment for her exacerbations due to lack of insurance.   12/18/23 Since our last visit she has been treated for asthma exacerbation in January and March of this year. She has nearly completed her prednisone  course but is concerned about worsening wheezing, cough and shortness of breath once she is off steroids again. She has a congested cough with wheezing. She reports being on prednisone  is affecting her blood sugars as well. She is using nebulizers up to three times a day.   Discussion: 53 year old female with severe persistent asthma, previously on chronic prednisone  who presents for follow-up. Well controlled until 06/2022 requiring multiple prednisone  tapers and treated for pneumonia. After losing her insurance in July 2024, she has been unable to seek medical care and has uncontrolled asthma symptoms with untreated exacerbations in the last 6 months. Currently in exacerbation today. Pharmacy has enrolled patient Dupixent  again since she has new insurance with [employer]. Addressed questions and concerns regarding Dupixent  coverage which patient had concerns about affording. Discussed care and coordinated care with pharmacy team.   Severe persistent eosinophilic asthma  Currently in asthma exacerbation Prednisone  injection and taper CONTINUE Wixela 250-50 ONE puf TWICE a day CONTINUE Spiriva  1.25 mcg  TWO puffs ONCE a day             ADDENDUM: 12/20/23. Spiriva  not on formulary. Will change to Incruse ONE puff once daily. CONTINUE Albuterol  as needed for shortness of breath or wheezing CONTINUE Singulair  ONCE a day Please contact Dupixent  copay card company Reviewed pulmonary  function tests with elevated DLCO consistent with asthma   Asthma Action Plan Increase Albuterol  as needed for worsening shortness of breath, wheezing and cough. If you symptoms do not improve in 24-48 hours, please our office for evaluation and/or prednisone  taper.     03/21/2024- Interim hx  Patient presents today for asthma follow-up Last seen in April  Maintained on [place] and Spiriva   On dupixent , weaned off steroids     2022 Jan Feb Mar April May June July Aug Sept Oct Nov Dec                             2023 Jan Feb Mar April May June July Aug Sept Oct Nov Dec                      XX      2024 Jan Feb Mar April May June July Aug Sept Oct Nov Dec                               2025 Jan Feb Mar April May June July Aug Sept Oct Nov Dec    X   X                    2026 Jan Feb Mar April May June July Aug Sept Oct Nov Dec                             2027 Jan Feb Mar April May June July Aug Sept Oct Nov Dec                                   Allergies  Allergen Reactions   Hydrocortisone Rash and Hives   Shellfish Allergy  Anaphylaxis and Hives   Sulfamethoxazole -Trimethoprim  Rash, Other (See Comments) and Itching    Facial redness with pruritus  Facial redness with pruritus     Immunization History  Administered Date(s) Administered   Hepatitis B 03/23/2015, 07/06/2015   Hepatitis B, PED/ADOLESCENT 03/23/2015, 07/06/2015   Influenza Split 05/25/2015   Influenza,inj,Quad PF,6+ Mos 05/13/2016, 05/29/2017, 07/13/2018, 06/03/2019, 06/17/2020   Influenza-Unspecified 05/25/2015, 05/13/2016, 05/29/2017, 07/13/2018, 06/03/2019   PFIZER(Purple Top)SARS-COV-2 Vaccination 12/02/2019, 12/23/2019   Pneumococcal Polysaccharide-23 07/31/2017   Pneumococcal-Unspecified 04/12/2012   Tdap 06/24/2015    Past Medical History:  Diagnosis Date   Anemia    Asthma    Bilateral hand numbness 06/25/2015   Bone pain 02/27/2015   Diffuse bone pain in legs mostly but also in arms      Breast pain, left 03/27/2015   Callus of heel 12/16/2014   Compression fracture    Compression fracture of L1 lumbar vertebra (HCC) 08/26/2014   Compression fracture of T12 vertebra (HCC) 08/28/2014   COPD (chronic obstructive pulmonary disease) (HCC) 2013   Cushing syndrome (HCC) 2013    Depression    Diabetes (HCC) 2013    Diabetes mellitus type 2, uncontrolled 08/26/2014  Sees Dr. Octavia.  Last visit 11/27/14.  No diabetic retinopathy     Diabetes mellitus with neuropathy (HCC)    Diastolic dysfunction without heart failure 10/14/2014   Grade 2 diastolic dysfunction 2/2 pulm HTN    Generalized anxiety disorder 03/27/2015   HTN (hypertension) 2005    Hx of cataract surgery 11/03/2014   LUQ abdominal pain 07/17/2015   Medication management 05/12/2015   Morbid obesity (HCC) 09/08/2014   MRSA colonization 12/15/2014   Non-English speaking patient 09/08/2014   OSA (obstructive sleep apnea) 05/20/2015   Osteopenia 11/19/2013   Dx with osteopenia in lumbar vertebra with partial compression of L1   Pain due to onychomycosis of toenail 03/27/2015   Secondary Cushing's syndrome/ iatrogenic    Skin rash 07/17/2015   Tachycardia 01/13/2015   Upper airway cough syndrome 09/05/2014   Followed in Pulmonary clinic/ [facility]/ [place]  - Trial off advair      09/05/2014 >>>  - trial off theoph 09/17/2014 and added flutter  - rec reduce dulera  to 100 2 bid 07/13/2015      Vision changes 06/25/2015   Vitamin D  deficiency 08/28/2014    Tobacco History: Social History   Tobacco Use  Smoking Status Never  Smokeless Tobacco Never   Counseling given: Not Answered   Outpatient Medications Prior to Visit  Medication Sig Dispense Refill   albuterol  (PROVENTIL ) (2.5 MG/3ML) 0.083% nebulizer solution Take 3 mLs (2.5 mg total) by nebulization every 6 (six) hours as needed for wheezing or shortness of breath. 75 mL 5   albuterol  (VENTOLIN  HFA) 108 (90 Base) MCG/ACT inhaler Inhale 2 puffs into the lungs every  6 (six) hours as needed for wheezing or shortness of breath. 18 g 6   aspirin  EC 81 MG tablet Take 1 tablet (81 mg total) by mouth daily. 90 tablet 3   BD INSULIN  SYRINGE U-500 31G X 0.5 ML MISC U UTD TID SUBCUTANEOUSLY     benzonatate  (TESSALON ) 200 MG capsule Take 1 capsule (200 mg total) by mouth 3 (three) times daily as needed for cough. 30 capsule 1   Blood Glucose Monitoring Suppl (ACCU-CHEK AVIVA PLUS) W/DEVICE KIT Used as directed 1 kit 0   buprenorphine (BUTRANS) 20 MCG/HR PTWK Place 1 patch onto the skin once a week.     Buprenorphine HCl (BELBUCA) 450 MCG FILM Place inside cheek. (Patient not taking: Reported on 12/18/2023)     cabergoline  (DOSTINEX ) 0.5 MG tablet Take 0.5 tablets (0.25 mg total) by mouth 2 (two) times a week. 10 tablet 3   calcium  carbonate (OS-CAL) 600 MG TABS tablet Take 1 tablet by mouth daily.     cholecalciferol  (VITAMIN D ) 1000 UNITS tablet Take 1,000 Units by mouth every morning.     cloNIDine  (CATAPRES ) 0.2 MG tablet Take 1 tablet (0.2 mg total) by mouth 2 (two) times daily. 180 tablet 3   cyclobenzaprine (FLEXERIL) 10 MG tablet Take 10 mg by mouth 3 (three) times daily as needed.     dexlansoprazole (DEXILANT) 60 MG capsule Take 60 mg by mouth daily. (Patient not taking: Reported on 12/18/2023)     dupilumab  (DUPIXENT ) 300 MG/2ML prefilled syringe Inject 300 mg into the skin every 14 (fourteen) days. 12 mL 1   ENBREL 50 MG/ML injection SMARTSIG:1 Milliliter(s) SUB-Q Once a Week     fluticasone -salmeterol (WIXELA INHUB) 250-50 MCG/ACT AEPB Inhale 1 puff into the lungs in the morning and at bedtime. 60 each 11   folic acid  (FOLVITE ) 400 MCG tablet  Take 400 mcg by mouth daily.     furosemide  (LASIX ) 20 MG tablet Take 1 tablet (20 mg total) by mouth every morning. 30 tablet 2   gabapentin  (NEURONTIN ) 400 MG capsule Take 2 capsules (800 mg total) by mouth 3 (three) times daily. 180 capsule 3   glucose blood (RELION GLUCOSE TEST STRIPS) test strip 1 each by Other  route 4 (four) times daily. ICD code E11.65 400 each 3   hydrOXYzine  (ATARAX /VISTARIL ) 25 MG tablet Take 25 mg by mouth at bedtime as needed.     insulin  aspart (NOVOLOG ) 100 UNIT/ML FlexPen Inject 12 Units into the skin 3 (three) times daily.     Lancet Devices (ACCU-CHEK SOFTCLIX) lancets Use as instructed 1 each 0   levothyroxine (SYNTHROID, LEVOTHROID) 75 MCG tablet Take 112 mcg by mouth daily before breakfast.      linaclotide (LINZESS) 145 MCG CAPS capsule Take 145 mcg by mouth daily before breakfast.     losartan  (COZAAR ) 100 MG tablet Take 1 tablet (100 mg total) by mouth every morning. 30 tablet 6   methocarbamol  (ROBAXIN ) 750 MG tablet Take 1 tablet (750 mg total) by mouth 3 (three) times daily. MUST MAKE APPT FOR FURTHER REFILLS 60 tablet 0   methotrexate (RHEUMATREX) 2.5 MG tablet Take 2.5 mg by mouth once a week. 8 pills wkly .Caution:Chemotherapy. Protect from light.      metoprolol  tartrate (LOPRESSOR ) 100 MG tablet TAKE 1 TAB AN HOUR BEFORE CT 1 tablet 0   Misc. Devices (CANE) MISC 1 each by Does not apply route daily. 1 each 0   montelukast  (SINGULAIR ) 10 MG tablet Take 1 tablet (10 mg total) by mouth at bedtime. 90 tablet 3   nitroGLYCERIN  (NITROSTAT ) 0.4 MG SL tablet Place 1 tablet (0.4 mg total) under the tongue every 5 (five) minutes as needed. 25 tablet 3   omeprazole  (PRILOSEC) 40 MG capsule Take 40 mg by mouth daily.     PARoxetine  (PAXIL ) 20 MG tablet Take 1 tablet (20 mg total) by mouth daily. MUST MAKE APPT FOR FURTHER REFILLS 30 tablet 1   predniSONE  (DELTASONE ) 10 MG tablet Take 6 tablets x three days (60 mg), followed by 5 tablets x three days (50mg ), then 4 tablets x three day(40mg ), then 3 tablets (30mg ) x three days, then 2 tablets (20mg ) x three days, then 1 tablet (10mg ) x three days, then STOP 60 tablet 0   promethazine  (PHENERGAN ) 25 MG tablet Take 1 tablet (25 mg total) by mouth every 6 (six) hours as needed for nausea or vomiting. 15 tablet 0   rosuvastatin   (CRESTOR ) 20 MG tablet Take 1 tablet (20 mg total) by mouth daily. 90 tablet 3   spironolactone  (ALDACTONE ) 25 MG tablet Take 25 mg by mouth daily.     terbinafine  (LAMISIL ) 250 MG tablet Take 1 tablet (250 mg total) by mouth daily. 90 tablet 0   TOUJEO  MAX SOLOSTAR 300 UNIT/ML Solostar Pen SMARTSIG:60 Unit(s) SUB-Q Every Night     traMADol  (ULTRAM ) 50 MG tablet Take by mouth every 6 (six) hours as needed.     traZODone (DESYREL) 50 MG tablet Take 50 mg by mouth at bedtime.     umeclidinium bromide  (INCRUSE ELLIPTA ) 62.5 MCG/ACT AEPB Inhale 1 puff into the lungs daily. 30 each 5   verapamil  (CALAN -SR) 180 MG CR tablet Take 2 tablets (360 mg total) by mouth at bedtime. 180 tablet 2   Facility-Administered Medications Prior to Visit  Medication Dose Route Frequency Provider Last Rate  Last Admin   betamethasone  acetate-betamethasone  sodium phosphate (CELESTONE ) injection 3 mg  3 mg Intramuscular Once Janit Mcardle M, DPM       DOBUTamine  (DOBUTREX ) 1,000 mcg/mL in dextrose  5% 250 mL infusion  30 mcg/kg/min Intravenous Continuous Croitoru, Mihai, MD 145.3 mL/hr at 04/22/15 1300 30 mcg/kg/min at 04/22/15 1300      Review of Systems  Review of Systems   Physical Exam  LMP 07/20/2018  Physical Exam   Lab Results:  CBC    Component Value Date/Time   WBC 7.2 10/05/2023 1047   WBC 7.5 01/27/2022 1116   RBC 4.51 10/05/2023 1047   RBC 4.36 01/27/2022 1116   HGB 12.7 10/05/2023 1047   HCT 37.5 10/05/2023 1047   PLT 308 10/05/2023 1047   MCV 83 10/05/2023 1047   MCH 28.2 10/05/2023 1047   MCH 29.6 01/27/2022 1116   MCHC 33.9 10/05/2023 1047   MCHC 33.5 01/27/2022 1116   RDW 12.3 10/05/2023 1047   LYMPHSABS 2.9 10/05/2023 1047   MONOABS 0.9 01/27/2022 1116   EOSABS 0.9 (H) 10/05/2023 1047   BASOSABS 0.1 10/05/2023 1047    BMET    Component Value Date/Time   NA 139 09/11/2021 1901   NA 140 07/27/2021 1232   K 4.4 09/11/2021 1901   CL 103 09/11/2021 1901   CO2 29 09/11/2021  1901   GLUCOSE 80 09/11/2021 1901   BUN 14 09/11/2021 1901   BUN 13 07/27/2021 1232   CREATININE 0.84 09/11/2021 1901   CREATININE 1.09 (H) 07/23/2018 1026   CREATININE 0.84 11/07/2016 1641   CALCIUM  9.6 09/11/2021 1901   GFRNONAA >60 09/11/2021 1901   GFRNONAA 60 (L) 07/23/2018 1026   GFRNONAA 85 11/07/2016 1641   GFRAA 100 04/06/2020 1410   GFRAA >60 07/23/2018 1026   GFRAA >89 11/07/2016 1641    BNP    Component Value Date/Time   BNP 53.3 09/08/2014 2213    ProBNP    Component Value Date/Time   PROBNP 28.0 02/16/2015 0930    Imaging: No results found.   Assessment & Plan:   No problem-specific Assessment & Plan notes found for this encounter.     Almarie LELON Ferrari, NP 03/20/2024

## 2024-03-21 ENCOUNTER — Ambulatory Visit (HOSPITAL_BASED_OUTPATIENT_CLINIC_OR_DEPARTMENT_OTHER): Admitting: Primary Care

## 2024-03-21 ENCOUNTER — Encounter (HOSPITAL_BASED_OUTPATIENT_CLINIC_OR_DEPARTMENT_OTHER): Payer: Self-pay | Admitting: Primary Care

## 2024-03-21 NOTE — Telephone Encounter (Signed)
 Received notification from [employer] regarding a prior authorization for DUPIXENT . Authorization has been APPROVED from 03/20/2024 to 03/20/2025.  Authorization # [id number]  Sherry Pennant, PharmD, MPH, BCPS, CPP Clinical Pharmacist (Rheumatology and Pulmonology)

## 2024-05-02 ENCOUNTER — Other Ambulatory Visit: Payer: Self-pay

## 2024-05-02 DIAGNOSIS — M79604 Pain in right leg: Secondary | ICD-10-CM

## 2024-06-10 ENCOUNTER — Ambulatory Visit: Admitting: Surgery

## 2024-06-10 ENCOUNTER — Ambulatory Visit (HOSPITAL_COMMUNITY)
Admission: RE | Admit: 2024-06-10 | Discharge: 2024-06-10 | Disposition: A | Discharge status: Home / Self Care | Source: Ambulatory Visit | Attending: Surgery | Admitting: Surgery

## 2024-06-10 ENCOUNTER — Encounter: Payer: Self-pay | Admitting: Surgery

## 2024-06-10 VITALS — BP 128/77 | HR 83 | Temp 97.8°F | Resp 18 | Ht <= 58 in | Wt 191.8 lb

## 2024-06-10 DIAGNOSIS — I739 Peripheral vascular disease, unspecified: Secondary | ICD-10-CM | POA: Insufficient documentation

## 2024-06-10 DIAGNOSIS — M79604 Pain in right leg: Secondary | ICD-10-CM | POA: Diagnosis present

## 2024-06-10 DIAGNOSIS — M79605 Pain in left leg: Secondary | ICD-10-CM | POA: Diagnosis present

## 2024-06-10 LAB — VAS US ABI WITH/WO TBI
Left ABI: 1.17
Right ABI: 1.14

## 2024-06-10 NOTE — Progress Notes (Signed)
 [facility]  Patient name: Raven Turner MRN: [id number] DOB: 1970/12/26 Sex: female   REQUESTING PROVIDER:    Heron Fear   REASON FOR CONSULT:    ? PAD  HISTORY OF PRESENT ILLNESS:   Raven Turner is a 53 y.o. female, who is referred for evaluation of peripheral vascular disease.  The patient does suffer from diabetes.  She denies any claudication symptoms.  She does not have any nonhealing wounds.  She did have some spots on her leg for which she was started on Plavix and these have gotten better.  She is a non-smoker.  She is medically managed for hypertension with an ARB.  She takes a statin for hypercholesterolemia.  PAST MEDICAL HISTORY    Past Medical History:  Diagnosis Date   Anemia    Asthma    Bilateral hand numbness 06/25/2015   Bone pain 02/27/2015   Diffuse bone pain in legs mostly but also in arms     Breast pain, left 03/27/2015   Callus of heel 12/16/2014   Compression fracture    Compression fracture of L1 lumbar vertebra (HCC) 08/26/2014   Compression fracture of T12 vertebra (HCC) 08/28/2014   COPD (chronic obstructive pulmonary disease) (HCC) 2013   Cushing syndrome 2013    Depression    Diabetes (HCC) 2013    Diabetes mellitus type 2, uncontrolled 08/26/2014   Sees Dr. Octavia.  Last visit 11/27/14.  No diabetic retinopathy     Diabetes mellitus with neuropathy (HCC)    Diastolic dysfunction without heart failure 10/14/2014   Grade 2 diastolic dysfunction 2/2 pulm HTN    Generalized anxiety disorder 03/27/2015   HTN (hypertension) 2005    Hx of cataract surgery 11/03/2014   LUQ abdominal pain 07/17/2015   Medication management 05/12/2015   Morbid obesity (HCC) 09/08/2014   MRSA colonization 12/15/2014   Non-English speaking patient 09/08/2014   OSA (obstructive sleep apnea) 05/20/2015   Osteopenia 11/19/2013   Dx with osteopenia in lumbar vertebra with partial compression  of L1   Pain due to onychomycosis of toenail 03/27/2015   Secondary Cushing's syndrome/ iatrogenic    Skin rash 07/17/2015   Tachycardia 01/13/2015   Upper airway cough syndrome 09/05/2014   Followed in Pulmonary clinic/ [facility]/ [facility]  - Trial off advair      09/05/2014 >>>  - trial off theoph 09/17/2014 and added flutter  - rec reduce dulera  to 100 2 bid 07/13/2015      Vision changes 06/25/2015   Vitamin D  deficiency 08/28/2014     FAMILY HISTORY   Family History  Problem Relation Age of Onset   Diabetes Mother    High blood pressure Mother    High Cholesterol Mother    Stroke Father    Asthma Father    High blood pressure Father    High Cholesterol Father    Heart disease Father    Sudden death Father    Hypertension Sister    Hypertension Brother     SOCIAL HISTORY:   Social History   Socioeconomic History   Marital status: Married    Spouse name: Jose   Number of children: 3   Years of education: Not on file   Highest education level: Not on file  Occupational History   Occupation: stay at home  Tobacco Use   Smoking status: Never   Smokeless tobacco: Never  Vaping Use   Vaping status: Never Used  Substance and Sexual  Activity   Alcohol use: No    Alcohol/week: 0.0 standard drinks of alcohol   Drug use: No   Sexual activity: Yes    Birth control/protection: Surgical  Other Topics Concern   Not on file  Social History Narrative   Lives at home with husband.  No children   She does not work   Highest level of education:  12th grade   Social Drivers of Corporate investment banker Strain: Not on file  Food Insecurity: Not on file  Transportation Needs: Not on file  Physical Activity: Not on file  Stress: Not on file  Social Connections: Not on file  Intimate Partner Violence: Not on file    ALLERGIES:    Allergies  Allergen Reactions   Hydrocortisone Rash and Hives   Shellfish Allergy  Anaphylaxis and Hives    Sulfamethoxazole -Trimethoprim  Rash, Other (See Comments) and Itching    Facial redness with pruritus  Facial redness with pruritus     CURRENT MEDICATIONS:    Current Outpatient Medications  Medication Sig Dispense Refill   albuterol  (PROVENTIL ) (2.5 MG/3ML) 0.083% nebulizer solution Take 3 mLs (2.5 mg total) by nebulization every 6 (six) hours as needed for wheezing or shortness of breath. 75 mL 5   albuterol  (VENTOLIN  HFA) 108 (90 Base) MCG/ACT inhaler Inhale 2 puffs into the lungs every 6 (six) hours as needed for wheezing or shortness of breath. 18 g 6   aspirin  EC 81 MG tablet Take 1 tablet (81 mg total) by mouth daily. 90 tablet 3   BD INSULIN  SYRINGE U-500 31G X 0.5 ML MISC U UTD TID SUBCUTANEOUSLY     benzonatate  (TESSALON ) 200 MG capsule Take 1 capsule (200 mg total) by mouth 3 (three) times daily as needed for cough. 30 capsule 1   Blood Glucose Monitoring Suppl (ACCU-CHEK AVIVA PLUS) W/DEVICE KIT Used as directed 1 kit 0   buprenorphine (BUTRANS) 20 MCG/HR PTWK Place 1 patch onto the skin once a week.     Buprenorphine HCl (BELBUCA) 450 MCG FILM Place inside cheek.     cabergoline  (DOSTINEX ) 0.5 MG tablet Take 0.5 tablets (0.25 mg total) by mouth 2 (two) times a week. 10 tablet 3   calcium  carbonate (OS-CAL) 600 MG TABS tablet Take 1 tablet by mouth daily.     cholecalciferol  (VITAMIN D ) 1000 UNITS tablet Take 1,000 Units by mouth every morning.     cloNIDine  (CATAPRES ) 0.2 MG tablet Take 1 tablet (0.2 mg total) by mouth 2 (two) times daily. 180 tablet 3   cyclobenzaprine (FLEXERIL) 10 MG tablet Take 10 mg by mouth 3 (three) times daily as needed.     dexlansoprazole (DEXILANT) 60 MG capsule Take 60 mg by mouth daily.     dupilumab  (DUPIXENT ) 300 MG/2ML prefilled syringe Inject 300 mg into the skin every 14 (fourteen) days. 12 mL 1   ENBREL 50 MG/ML injection SMARTSIG:1 Milliliter(s) SUB-Q Once a Week     fluticasone -salmeterol (WIXELA INHUB) 250-50 MCG/ACT AEPB Inhale 1 puff  into the lungs in the morning and at bedtime. 60 each 11   folic acid  (FOLVITE ) 400 MCG tablet Take 400 mcg by mouth daily.     furosemide  (LASIX ) 20 MG tablet Take 1 tablet (20 mg total) by mouth every morning. 30 tablet 2   gabapentin  (NEURONTIN ) 400 MG capsule Take 2 capsules (800 mg total) by mouth 3 (three) times daily. 180 capsule 3   glucose blood (RELION GLUCOSE TEST STRIPS) test strip 1 each by  Other route 4 (four) times daily. ICD code E11.65 400 each 3   hydrOXYzine  (ATARAX /VISTARIL ) 25 MG tablet Take 25 mg by mouth at bedtime as needed.     insulin  aspart (NOVOLOG ) 100 UNIT/ML FlexPen Inject 12 Units into the skin 3 (three) times daily.     Lancet Devices (ACCU-CHEK SOFTCLIX) lancets Use as instructed 1 each 0   levothyroxine (SYNTHROID, LEVOTHROID) 75 MCG tablet Take 112 mcg by mouth daily before breakfast.      linaclotide (LINZESS) 145 MCG CAPS capsule Take 145 mcg by mouth daily before breakfast.     losartan  (COZAAR ) 100 MG tablet Take 1 tablet (100 mg total) by mouth every morning. 30 tablet 6   methocarbamol  (ROBAXIN ) 750 MG tablet Take 1 tablet (750 mg total) by mouth 3 (three) times daily. MUST MAKE APPT FOR FURTHER REFILLS 60 tablet 0   methotrexate (RHEUMATREX) 2.5 MG tablet Take 2.5 mg by mouth once a week. 8 pills wkly .Caution:Chemotherapy. Protect from light.      metoprolol  tartrate (LOPRESSOR ) 100 MG tablet TAKE 1 TAB AN HOUR BEFORE CT 1 tablet 0   Misc. Devices (CANE) MISC 1 each by Does not apply route daily. 1 each 0   montelukast  (SINGULAIR ) 10 MG tablet Take 1 tablet (10 mg total) by mouth at bedtime. 90 tablet 3   nitroGLYCERIN  (NITROSTAT ) 0.4 MG SL tablet Place 1 tablet (0.4 mg total) under the tongue every 5 (five) minutes as needed. 25 tablet 3   omeprazole  (PRILOSEC) 40 MG capsule Take 40 mg by mouth daily.     PARoxetine  (PAXIL ) 20 MG tablet Take 1 tablet (20 mg total) by mouth daily. MUST MAKE APPT FOR FURTHER REFILLS 30 tablet 1   predniSONE  (DELTASONE ) 10  MG tablet Take 6 tablets x three days (60 mg), followed by 5 tablets x three days (50mg ), then 4 tablets x three day(40mg ), then 3 tablets (30mg ) x three days, then 2 tablets (20mg ) x three days, then 1 tablet (10mg ) x three days, then STOP 60 tablet 0   promethazine  (PHENERGAN ) 25 MG tablet Take 1 tablet (25 mg total) by mouth every 6 (six) hours as needed for nausea or vomiting. 15 tablet 0   rosuvastatin  (CRESTOR ) 20 MG tablet Take 1 tablet (20 mg total) by mouth daily. 90 tablet 3   spironolactone  (ALDACTONE ) 25 MG tablet Take 25 mg by mouth daily.     terbinafine  (LAMISIL ) 250 MG tablet Take 1 tablet (250 mg total) by mouth daily. 90 tablet 0   TOUJEO  MAX SOLOSTAR 300 UNIT/ML Solostar Pen SMARTSIG:60 Unit(s) SUB-Q Every Night     traMADol  (ULTRAM ) 50 MG tablet Take by mouth every 6 (six) hours as needed.     traZODone (DESYREL) 50 MG tablet Take 50 mg by mouth at bedtime.     umeclidinium bromide  (INCRUSE ELLIPTA ) 62.5 MCG/ACT AEPB Inhale 1 puff into the lungs daily. 30 each 5   verapamil  (CALAN -SR) 180 MG CR tablet Take 2 tablets (360 mg total) by mouth at bedtime. 180 tablet 2   Current Facility-Administered Medications  Medication Dose Route Frequency Provider Last Rate Last Admin   betamethasone  acetate-betamethasone  sodium phosphate (CELESTONE ) injection 3 mg  3 mg Intramuscular Once Evans, Brent M, DPM       Facility-Administered Medications Ordered in Other Visits  Medication Dose Route Frequency Provider Last Rate Last Admin   DOBUTamine  (DOBUTREX ) 1,000 mcg/mL in dextrose  5% 250 mL infusion  30 mcg/kg/min Intravenous Continuous Croitoru, Mihai, MD 145.3 mL/hr at  04/22/15 1300 30 mcg/kg/min at 04/22/15 1300    REVIEW OF SYSTEMS:   [X]  denotes positive finding, [ ]  denotes negative finding Cardiac  Comments:  Chest pain or chest pressure:    Shortness of breath upon exertion:    Short of breath when lying flat:    Irregular heart rhythm:        Vascular    Pain in calf,  thigh, or hip brought on by ambulation:    Pain in feet at night that wakes you up from your sleep:     Blood clot in your veins:    Leg swelling:         Pulmonary    Oxygen  at home:    Productive cough:     Wheezing:         Neurologic    Sudden weakness in arms or legs:     Sudden numbness in arms or legs:     Sudden onset of difficulty speaking or slurred speech:    Temporary loss of vision in one eye:     Problems with dizziness:         Gastrointestinal    Blood in stool:      Vomited blood:         Genitourinary    Burning when urinating:     Blood in urine:        Psychiatric    Major depression:         Hematologic    Bleeding problems:    Problems with blood clotting too easily:        Skin    Rashes or ulcers:        Constitutional    Fever or chills:     PHYSICAL EXAM:   Vitals:   06/10/24 0921  BP: 128/77  Pulse: 83  Temp: 97.8 F (36.6 C)  SpO2: 97%  Weight: 191 lb 12.8 oz (87 kg)  Height: 4' 7 (1.397 m)    GENERAL: The patient is a well-nourished female, in no acute distress. The vital signs are documented above. CARDIAC: There is a regular rate and rhythm.  VASCULAR: Palpable dorsalis pedis pulses PULMONARY: Nonlabored respirations ABDOMEN: Soft and non-tender  MUSCULOSKELETAL: There are no major deformities or cyanosis. NEUROLOGIC: No focal weakness or paresthesias are detected. SKIN: There are no ulcers or rashes noted. PSYCHIATRIC: The patient has a normal affect.  STUDIES:   I have reviewed the following studies: ABI/TBIToday's ABIToday's TBIPrevious ABIPrevious TBI  +-------+-----------+-----------+------------+------------+  Right 1.14       0.80       1.2                       +-------+-----------+-----------+------------+------------+  Left  1.17       0.89       1.0                       +-------+-----------+-----------+------------+------------+  Right toe pressure: 112 Left toe pressure: 124 All  waveforms are triphasic  ASSESSMENT and PLAN   PAD: The patient has normal ABIs and toe pressures.  She also has palpable pedal pulses.  I stressed the importance of daily foot inspection and to contact me should she develop a nonhealing wound.  In the meantime she needs to have better control of her diabetes.  She should continue with her statin and blood pressure control.  She needs to abstain from smoking.  She would benefit  from regular exercise.  She asked me about Plavix.  I do not think there is a vascular reason to continue this.  I have recommended 81 mg of aspirin  as an alternative   Malvina New, IV, MD, FACS [facility] [phone] Pager [phone]

## 2024-06-28 ENCOUNTER — Encounter (HOSPITAL_BASED_OUTPATIENT_CLINIC_OR_DEPARTMENT_OTHER): Payer: Self-pay | Admitting: Pulmonary Disease

## 2024-06-28 ENCOUNTER — Ambulatory Visit (HOSPITAL_BASED_OUTPATIENT_CLINIC_OR_DEPARTMENT_OTHER): Admitting: Pulmonary Disease

## 2024-06-28 VITALS — BP 125/75 | HR 76 | Ht <= 58 in | Wt 187.0 lb

## 2024-06-28 DIAGNOSIS — J4551 Severe persistent asthma with (acute) exacerbation: Secondary | ICD-10-CM | POA: Diagnosis not present

## 2024-06-28 DIAGNOSIS — J4550 Severe persistent asthma, uncomplicated: Secondary | ICD-10-CM

## 2024-06-28 MED ORDER — INCRUSE ELLIPTA 62.5 MCG/ACT IN AEPB: 1.0000 | INHALATION_SPRAY | Freq: Every day | RESPIRATORY_TRACT | 11 refills | Status: AC

## 2024-06-28 MED ORDER — FLUTICASONE-SALMETEROL 250-50 MCG/ACT IN AEPB: 1.0000 | INHALATION_SPRAY | Freq: Two times a day (BID) | RESPIRATORY_TRACT | 11 refills | Status: AC

## 2024-06-28 NOTE — Assessment & Plan Note (Signed)
 CONTINUE Wixela 250-50 ONE puf TWICE a day START Incruse ONE puff once daily. CONTINUE Albuterol  as needed for shortness of breath or wheezing CONTINUE Singulair  ONCE a day CONTINUE Dupixent  If symptoms worsen, reconsider starting different biologic ORDER labs: CBC with diff, IgE

## 2024-06-28 NOTE — Assessment & Plan Note (Signed)
Prednisone taper

## 2024-06-28 NOTE — Patient Instructions (Signed)
 Severe persistent eosinophilic asthma  Currently in exacerbation CONTINUE Wixela 250-50 ONE puf TWICE a day CONTINUE Incruse ONE puff once daily. CONTINUE Albuterol  as needed for shortness of breath or wheezing CONTINUE Singulair  ONCE a day CONTINUE Dupixent  If symptoms worsen, reconsider starting different biologic ORDER labs: CBC with diff, IgE

## 2024-06-28 NOTE — Progress Notes (Signed)
 Subjective:   PATIENT ID: Raven Turner GENDER: female DOB: 02-May-1971, MRN: [id number]   HPI  Chief Complaint  Patient presents with   Asthma    Follow up     Reason for Visit: Follow-up   Ms. Santiaga Butzin is a 53 year-old Spanish speaking female never smoker who presents follow-up of severe persistent asthma. In person translator present  Synopsis: For management of her asthma she is on Dupixent  and Dulera  and Spiriva . When she lived in [place] she stated she lived in the hospital. Last hospitalization in 2015. No further hospitalization since starting Dupixent . Weaned off of chronic steroids  04/23/21 Overall she feels she is doing well. She has more good days than bad days. Heat will worsen her shortness of breath. She has had to use her albuterol  more frequently due to shortness of breath related to the weather three weeks ago but doing better this week, using albuterol  only twice. Denies cough, wheezing or recent illness. Currently on home Dupixent  and states she rotate sites.  11/16/21 Since our last visit she reports that overall she is well-controlled on Dupixent . She is compliant with her Spiriva  and Dulera  except when she ran out of refills and had to be without for awhile. She usually needs albuterol  once a week but in the last weekend had to use the nebulizer and handheld multiple times.   08/29/22 Her asthma symptoms are overall well controlled. In 2022 she has had no exacerbations and until the fall of 2023, she has not needed prednisone  or antibiotics. In October she had multiple exacerbations requiring prolonged steroid taper. She is feeling improved but has noticed that she is having occasional upper airway wheeze and taking her albuterol  more often in the last day  10/05/23 Since our last visit she had lost her Medicaid and has reapplied for new insurance. She tried to extend her Dupixent  by taking it monthly or when she felt sick with last  dose about 1.5 months ago. She has run out at this point. She is still taking Dulera , Spiriva  and ventolin . Uses ventolin  daily 1-3 times a day.  She is having wheezing cough and shortness of breath. Using nebulizer. Rarely waking up in the nights due to her asthma. Has not sough out treatment for her exacerbations due to lack of insurance.  12/18/23 Since our last visit she has been treated for asthma exacerbation in January and March of this year. She has nearly completed her prednisone  course but is concerned about worsening wheezing, cough and shortness of breath once she is off steroids again. She has a congested cough with wheezing. She reports being on prednisone  is affecting her blood sugars as well. She is using nebulizers up to three times a day.  06/28/24 She has had worsening two months with wheezing and shortness of breath. Rarely coughing. Has been compliant with Wixela and Dupixent . Never received Spiriva  or Incruse. She uses rescue inhaler/nebulizer 2-3 times in the last week.  2022 Jan Feb Mar April May June July Aug Sept Oct Nov Dec                2023 Jan Feb Mar April May June July Aug Sept Oct Nov Dec            XX    2024 Jan Feb Mar April May June July Aug Sept Oct Nov Dec                 2025 Jan Feb  Mar April May June July Aug Sept Oct Nov Dec   X  X       X    2026 Jan Feb Mar April May June July Aug Sept Oct Nov Dec                2027 Jan Feb Mar April May June July Aug Sept Oct Nov Dec                   Asthma Control Test ACT Total Score  06/28/2024 11:03 AM 16  10/05/2023  9:58 AM 15  08/29/2022 11:22 AM 16    Past Medical History:  Diagnosis Date   Anemia    Asthma    Bilateral hand numbness 06/25/2015   Bone pain 02/27/2015   Diffuse bone pain in legs mostly but also in arms     Breast pain, left 03/27/2015   Callus of heel 12/16/2014   Compression fracture    Compression fracture of L1 lumbar vertebra (HCC) 08/26/2014   Compression fracture of  T12 vertebra (HCC) 08/28/2014   COPD (chronic obstructive pulmonary disease) (HCC) 2013   Cushing syndrome 2013    Depression    Diabetes (HCC) 2013    Diabetes mellitus type 2, uncontrolled 08/26/2014   Sees Dr. Octavia.  Last visit 11/27/14.  No diabetic retinopathy     Diabetes mellitus with neuropathy (HCC)    Diastolic dysfunction without heart failure 10/14/2014   Grade 2 diastolic dysfunction 2/2 pulm HTN    Generalized anxiety disorder 03/27/2015   HTN (hypertension) 2005    Hx of cataract surgery 11/03/2014   LUQ abdominal pain 07/17/2015   Medication management 05/12/2015   Morbid obesity (HCC) 09/08/2014   MRSA colonization 12/15/2014   Non-English speaking patient 09/08/2014   OSA (obstructive sleep apnea) 05/20/2015   Osteopenia 11/19/2013   Dx with osteopenia in lumbar vertebra with partial compression of L1   Pain due to onychomycosis of toenail 03/27/2015   Secondary Cushing's syndrome/ iatrogenic    Skin rash 07/17/2015   Tachycardia 01/13/2015   Upper airway cough syndrome 09/05/2014   Followed in Pulmonary clinic/ [facility]/ [facility]  - Trial off advair      09/05/2014 >>>  - trial off theoph 09/17/2014 and added flutter  - rec reduce dulera  to 100 2 bid 07/13/2015      Vision changes 06/25/2015   Vitamin D  deficiency 08/28/2014     Allergies  Allergen Reactions   Hydrocortisone Rash and Hives   Shellfish Allergy  Anaphylaxis and Hives   Sulfamethoxazole -Trimethoprim  Rash, Other (See Comments) and Itching    Facial redness with pruritus  Facial redness with pruritus      Outpatient Medications Prior to Visit  Medication Sig Dispense Refill   albuterol  (PROVENTIL ) (2.5 MG/3ML) 0.083% nebulizer solution Take 3 mLs (2.5 mg total) by nebulization every 6 (six) hours as needed for wheezing or shortness of breath. 75 mL 5   albuterol  (VENTOLIN  HFA) 108 (90 Base) MCG/ACT inhaler Inhale 2 puffs into the lungs every 6 (six) hours as needed for wheezing or shortness of breath.  18 g 6   aspirin  EC 81 MG tablet Take 1 tablet (81 mg total) by mouth daily. 90 tablet 3   BD INSULIN  SYRINGE U-500 31G X 0.5 ML MISC U UTD TID SUBCUTANEOUSLY     benzonatate  (TESSALON ) 200 MG capsule Take 1 capsule (200 mg total) by mouth 3 (three) times daily as needed for cough. 30 capsule 1  Blood Glucose Monitoring Suppl (ACCU-CHEK AVIVA PLUS) W/DEVICE KIT Used as directed 1 kit 0   buprenorphine (BUTRANS) 20 MCG/HR PTWK Place 1 patch onto the skin once a week.     cabergoline  (DOSTINEX ) 0.5 MG tablet Take 0.5 tablets (0.25 mg total) by mouth 2 (two) times a week. 10 tablet 3   calcium  carbonate (OS-CAL) 600 MG TABS tablet Take 1 tablet by mouth daily.     cholecalciferol  (VITAMIN D ) 1000 UNITS tablet Take 1,000 Units by mouth every morning.     cloNIDine  (CATAPRES ) 0.2 MG tablet Take 1 tablet (0.2 mg total) by mouth 2 (two) times daily. 180 tablet 3   cyclobenzaprine (FLEXERIL) 10 MG tablet Take 10 mg by mouth 3 (three) times daily as needed.     dexlansoprazole (DEXILANT) 60 MG capsule Take 60 mg by mouth daily.     dupilumab  (DUPIXENT ) 300 MG/2ML prefilled syringe Inject 300 mg into the skin every 14 (fourteen) days. 12 mL 1   ENBREL 50 MG/ML injection SMARTSIG:1 Milliliter(s) SUB-Q Once a Week     folic acid  (FOLVITE ) 400 MCG tablet Take 400 mcg by mouth daily.     furosemide  (LASIX ) 20 MG tablet Take 1 tablet (20 mg total) by mouth every morning. 30 tablet 2   gabapentin  (NEURONTIN ) 400 MG capsule Take 2 capsules (800 mg total) by mouth 3 (three) times daily. 180 capsule 3   glucose blood (RELION GLUCOSE TEST STRIPS) test strip 1 each by Other route 4 (four) times daily. ICD code E11.65 400 each 3   hydrOXYzine  (ATARAX /VISTARIL ) 25 MG tablet Take 25 mg by mouth at bedtime as needed.     insulin  aspart (NOVOLOG ) 100 UNIT/ML FlexPen Inject 12 Units into the skin 3 (three) times daily.     Lancet Devices (ACCU-CHEK SOFTCLIX) lancets Use as instructed 1 each 0   levothyroxine  (SYNTHROID, LEVOTHROID) 75 MCG tablet Take 112 mcg by mouth daily before breakfast.      linaclotide (LINZESS) 145 MCG CAPS capsule Take 145 mcg by mouth daily before breakfast.     losartan  (COZAAR ) 100 MG tablet Take 1 tablet (100 mg total) by mouth every morning. 30 tablet 6   methocarbamol  (ROBAXIN ) 750 MG tablet Take 1 tablet (750 mg total) by mouth 3 (three) times daily. MUST MAKE APPT FOR FURTHER REFILLS 60 tablet 0   methotrexate (RHEUMATREX) 2.5 MG tablet Take 2.5 mg by mouth once a week. 8 pills wkly .Caution:Chemotherapy. Protect from light.      metoprolol  tartrate (LOPRESSOR ) 100 MG tablet TAKE 1 TAB AN HOUR BEFORE CT 1 tablet 0   Misc. Devices (CANE) MISC 1 each by Does not apply route daily. 1 each 0   montelukast  (SINGULAIR ) 10 MG tablet Take 1 tablet (10 mg total) by mouth at bedtime. 90 tablet 3   nitroGLYCERIN  (NITROSTAT ) 0.4 MG SL tablet Place 1 tablet (0.4 mg total) under the tongue every 5 (five) minutes as needed. 25 tablet 3   omeprazole  (PRILOSEC) 40 MG capsule Take 40 mg by mouth daily.     PARoxetine  (PAXIL ) 20 MG tablet Take 1 tablet (20 mg total) by mouth daily. MUST MAKE APPT FOR FURTHER REFILLS 30 tablet 1   promethazine  (PHENERGAN ) 25 MG tablet Take 1 tablet (25 mg total) by mouth every 6 (six) hours as needed for nausea or vomiting. 15 tablet 0   rosuvastatin  (CRESTOR ) 20 MG tablet Take 1 tablet (20 mg total) by mouth daily. 90 tablet 3   spironolactone  (ALDACTONE ) 25  MG tablet Take 25 mg by mouth daily.     terbinafine  (LAMISIL ) 250 MG tablet Take 1 tablet (250 mg total) by mouth daily. 90 tablet 0   TOUJEO  MAX SOLOSTAR 300 UNIT/ML Solostar Pen SMARTSIG:60 Unit(s) SUB-Q Every Night     traMADol  (ULTRAM ) 50 MG tablet Take by mouth every 6 (six) hours as needed.     traZODone (DESYREL) 50 MG tablet Take 50 mg by mouth at bedtime.     verapamil  (CALAN -SR) 180 MG CR tablet Take 2 tablets (360 mg total) by mouth at bedtime. 180 tablet 2   fluticasone -salmeterol (WIXELA  INHUB) 250-50 MCG/ACT AEPB Inhale 1 puff into the lungs in the morning and at bedtime. 60 each 11   predniSONE  (DELTASONE ) 10 MG tablet Take 6 tablets x three days (60 mg), followed by 5 tablets x three days (50mg ), then 4 tablets x three day(40mg ), then 3 tablets (30mg ) x three days, then 2 tablets (20mg ) x three days, then 1 tablet (10mg ) x three days, then STOP 60 tablet 0   umeclidinium bromide  (INCRUSE ELLIPTA ) 62.5 MCG/ACT AEPB Inhale 1 puff into the lungs daily. 30 each 5   Buprenorphine HCl (BELBUCA) 450 MCG FILM Place inside cheek. (Patient not taking: Reported on 06/28/2024)     Facility-Administered Medications Prior to Visit  Medication Dose Route Frequency Provider Last Rate Last Admin   DOBUTamine  (DOBUTREX ) 1,000 mcg/mL in dextrose  5% 250 mL infusion  30 mcg/kg/min Intravenous Continuous Croitoru, Mihai, MD 145.3 mL/hr at 04/22/15 1300 30 mcg/kg/min at 04/22/15 1300   betamethasone  acetate-betamethasone  sodium phosphate (CELESTONE ) injection 3 mg  3 mg Intramuscular Once Janit Thresa HERO, DPM        Review of Systems  Constitutional:  Negative for chills, diaphoresis, fever, malaise/fatigue and weight loss.  HENT:  Negative for congestion.   Respiratory:  Positive for shortness of breath and wheezing. Negative for cough, hemoptysis and sputum production.   Cardiovascular:  Negative for chest pain, palpitations and leg swelling.     Objective:   Vitals:   06/28/24 1059  BP: 125/75  Pulse: 76  SpO2: 99%  Weight: 187 lb (84.8 kg)  Height: 4' 7 (1.397 m)    SpO2: 99 %  Physical Exam: General: Well-appearing, no acute distress HENT: , AT Eyes: EOMI, no scleral icterus Respiratory: Clear to auscultation bilaterally.  No crackles, wheezing or rales Cardiovascular: RRR, -M/R/G, no JVD Extremities:-Edema,-tenderness Neuro: AAO x4, CNII-XII grossly intact Psych: Normal mood, normal affect   Data Reviewed:  Imaging: CT Chest 11/25/18 - Mild air trapping. Normal  parenchyma CXR 06/28/22 - Streaky opacities in RML 07/11/22 - Improved/resolving opacities  PFT: 09/02/19 FVC 1.99 (76%) FEV1 1.73 (81%) Ratio 81  TLC 103% DLCO 131%. No significant bronchodilator effect. Interpretation: No obstructive defect or restrictive defect seen on spirometry. Elevated DLCO can be seen in asthma, obesity and large lung volumes.  11/28/23 FVC 1.92 (76%) FEV1 1.59 (79%) Ratio 83  TLC 107% DLCO 134%. No significant bronchodilator response. Interpretation: No obstructive or restrictive defect. FVC and FEV1 reduced. Elevated DLCO can be seen in asthma, obesity.  Labs: Abs eos  03/17/20 - 400 10/05/23 - 900    Assessment & Plan:   Discussion: 53 year old female with severe persistent asthma, previously on chronic prednisone  who presents for follow-up.  Has been previously controlled on ICS/LABA and Dupixent  however has had exacerbations in January March and October (today) of this year.  Labs demonstrated eosinophilia in January, will recheck if persistent and we will plan  to change biologic coverage if symptoms remain uncontrolled.  Assessment & Plan Severe persistent asthma without complication (HCC) CONTINUE Wixela 250-50 ONE puf TWICE a day START Incruse ONE puff once daily. CONTINUE Albuterol  as needed for shortness of breath or wheezing CONTINUE Singulair  ONCE a day CONTINUE Dupixent  If symptoms worsen, reconsider starting different biologic ORDER labs: CBC with diff, IgE Severe persistent asthma with acute exacerbation (HCC) Prednisone  taper  Asthma Action Plan Increase Albuterol  as needed for worsening shortness of breath, wheezing and cough. If you symptoms do not improve in 24-48 hours, please our office for evaluation and/or prednisone  taper.  Health Maintenance Immunization History  Administered Date(s) Administered   Hepatitis B 03/23/2015, 07/06/2015   Hepatitis B, PED/ADOLESCENT 03/23/2015, 07/06/2015   Influenza Split 05/25/2015    Influenza,inj,Quad PF,6+ Mos 05/13/2016, 05/29/2017, 07/13/2018, 06/03/2019, 06/17/2020   Influenza-Unspecified 05/25/2015, 05/13/2016, 05/29/2017, 07/13/2018, 06/03/2019   PFIZER(Purple Top)SARS-COV-2 Vaccination 12/02/2019, 12/23/2019   Pneumococcal Polysaccharide-23 07/31/2017   Pneumococcal-Unspecified 04/12/2012   Tdap 06/24/2015    Orders Placed This Encounter  Procedures   CBC with Differential   IgE    Meds ordered this encounter  Medications   umeclidinium bromide  (INCRUSE ELLIPTA ) 62.5 MCG/ACT AEPB    Sig: Inhale 1 puff into the lungs daily.    Dispense:  30 each    Refill:  11   fluticasone -salmeterol (WIXELA INHUB) 250-50 MCG/ACT AEPB    Sig: Inhale 1 puff into the lungs in the morning and at bedtime.    Dispense:  60 each    Refill:  11    Return in about 3 months (around 09/28/2024).  I have spent a total time of 30-minutes on the day of the appointment including chart review, data review, collecting history, coordinating care and discussing medical diagnosis and plan with the patient/family. Past medical history, allergies, medications were reviewed. Pertinent imaging, labs and tests included in this note have been reviewed and interpreted independently by me.  Val Schiavo Slater Staff, MD [facility] 06/28/2024 12:34 PM

## 2024-06-30 ENCOUNTER — Telehealth: Payer: Self-pay | Admitting: Pulmonary Disease

## 2024-06-30 DIAGNOSIS — J4551 Severe persistent asthma with (acute) exacerbation: Secondary | ICD-10-CM

## 2024-06-30 MED ORDER — PREDNISONE 10 MG PO TABS: ORAL_TABLET | ORAL | 0 refills | Status: AC

## 2024-06-30 NOTE — Telephone Encounter (Signed)
 Weekend Call Note:  Patient called in with increase in cough, wheezing and dyspnea concerning for asthma flare. I have sent in presnidone taper.  Dorn Chill, MD [facility] Office: [phone]   See Amion for personal pager PCCM on call pager [phone] until 7pm. Please call Elink 7p-7a. [phone]

## 2024-07-01 LAB — CBC WITH DIFFERENTIAL/PLATELET
Basophils Absolute: 0.1 x10E3/uL (ref 0.0–0.2)
Basos: 1 %
EOS (ABSOLUTE): 0.8 x10E3/uL — ABNORMAL HIGH (ref 0.0–0.4)
Eos: 9 %
Hematocrit: 38.5 % (ref 34.0–46.6)
Hemoglobin: 12.3 g/dL (ref 11.1–15.9)
Immature Grans (Abs): 0 x10E3/uL (ref 0.0–0.1)
Immature Granulocytes: 0 %
Lymphocytes Absolute: 3.1 x10E3/uL (ref 0.7–3.1)
Lymphs: 36 %
MCH: 26.2 pg — ABNORMAL LOW (ref 26.6–33.0)
MCHC: 31.9 g/dL (ref 31.5–35.7)
MCV: 82 fL (ref 79–97)
Monocytes Absolute: 0.8 x10E3/uL (ref 0.1–0.9)
Monocytes: 9 %
Neutrophils Absolute: 3.9 x10E3/uL (ref 1.4–7.0)
Neutrophils: 45 %
Platelets: 352 x10E3/uL (ref 150–450)
RBC: 4.69 x10E6/uL (ref 3.77–5.28)
RDW: 13.5 % (ref 11.7–15.4)
WBC: 8.7 x10E3/uL (ref 3.4–10.8)

## 2024-07-01 LAB — IGE: IgE (Immunoglobulin E), Serum: 21 [IU]/mL (ref 6–495)

## 2024-07-02 ENCOUNTER — Ambulatory Visit: Payer: Self-pay | Admitting: Pulmonary Disease

## 2024-07-03 ENCOUNTER — Telehealth: Payer: Self-pay

## 2024-07-03 DIAGNOSIS — J4550 Severe persistent asthma, uncomplicated: Secondary | ICD-10-CM

## 2024-07-03 NOTE — Telephone Encounter (Signed)
 Submitted a Prior Authorization request to [employer] for TEZSPIRE via CoverMyMeds. Will update once we receive a response.  Key: [id number]

## 2024-07-03 NOTE — Telephone Encounter (Signed)
 Yes. When calling be aware that although she knows some basic English but will need Spanish interpretor on the line.

## 2024-07-03 NOTE — Telephone Encounter (Signed)
 Received message from Dr. Kassie - Patient currently on Dupixent  but failing with >3 exacerbations in the last 12 months so will need to be switched to alternative biologic. Does her insurance cover Tezspire?   Initiating benefits investigation for Tezspire in this encounter.   Aleck Puls, PharmD, BCPS, CPP Clinical Pharmacist  [facility]

## 2024-07-08 ENCOUNTER — Other Ambulatory Visit (HOSPITAL_COMMUNITY): Payer: Self-pay

## 2024-07-08 NOTE — Telephone Encounter (Signed)
 Received notification from [employer] regarding a prior authorization for TEZSPIRE. Authorization has been APPROVED from 07/03/24 to 12/31/24. Have not yet received the approval letter.  Per test claim, copay for 28 days supply is $0  Patient can fill through [facility]: [phone]  Authorization # [id number] Phone # [phone]

## 2024-07-09 MED ORDER — TEZSPIRE 210 MG/1.91ML ~~LOC~~ SOAJ
210.0000 mg | SUBCUTANEOUS | 0 refills | Status: DC
Start: 2024-07-09 — End: 2024-07-17
  Filled 2024-07-10: qty 1.91, 28d supply, fill #0

## 2024-07-09 NOTE — Telephone Encounter (Signed)
 Called patient - scheduled for Tezspire new start on 07/17/2024 @ 2pm at IM clinic for [facility]  Rx sent to [facility] for onboarding. Patient aware that Chasadee will reach out to coordinate shipment to office. MyChart message sent to patient with address for IM clinic  Sherry Pennant, PharmD, MPH, BCPS, CPP Clinical Pharmacist [facility])

## 2024-07-10 ENCOUNTER — Other Ambulatory Visit (HOSPITAL_COMMUNITY): Payer: Self-pay

## 2024-07-10 ENCOUNTER — Other Ambulatory Visit: Payer: Self-pay

## 2024-07-10 NOTE — Progress Notes (Signed)
**  Used Spanish interpreter [phone]**  Specialty Pharmacy Initial Fill Coordination Note  Raven Turner is a 52 y.o. female contacted today regarding initial fill of specialty medication(s) Tezepelumab-ekko Phill)   Patient requested Courier to Provider Office   Delivery date: 07/15/24   Verified address: [address], [place], [place] [address]   Medication will be filled on: 07/11/24   Patient is aware of $0 copayment.

## 2024-07-11 ENCOUNTER — Ambulatory Visit: Admitting: Internal Medicine

## 2024-07-12 ENCOUNTER — Other Ambulatory Visit: Payer: Self-pay

## 2024-07-12 NOTE — Progress Notes (Signed)
 [facility] - [facility]  Referring Provider: Dr. Acquanetta Slater Staff  HPI Patient presents today to [facility] to see pharmacy team for [facility] new start.  Past medical history includes severe persistent asthma on Dupixent  since June 2019. Her asthma has been relatively well controlled on treatment for sometime but has had worsening in the past two months.   Last seen by Dr. Staff 06/28/2024  Interpreter was available via video call however patient did not want interpreter.  Respiratory Medications Current regimen: Wixela 250/50mcg 1 puff twice daily + Incruse 62.5mcg 1 puff once daily + montelukast  10mg  nightly Patient reports no known adherence challenges  OBJECTIVE Allergies  Allergen Reactions   Hydrocortisone Rash and Hives   Shellfish Allergy  Anaphylaxis and Hives   Sulfamethoxazole -Trimethoprim  Rash, Other (See Comments) and Itching    Facial redness with pruritus  Facial redness with pruritus    Outpatient Encounter Medications as of 07/17/2024  Medication Sig   albuterol  (PROVENTIL ) (2.5 MG/3ML) 0.083% nebulizer solution Take 3 mLs (2.5 mg total) by nebulization every 6 (six) hours as needed for wheezing or shortness of breath.   albuterol  (VENTOLIN  HFA) 108 (90 Base) MCG/ACT inhaler Inhale 2 puffs into the lungs every 6 (six) hours as needed for wheezing or shortness of breath.   aspirin  EC 81 MG tablet Take 1 tablet (81 mg total) by mouth daily.   BD INSULIN  SYRINGE U-500 31G X 0.5 ML MISC U UTD TID SUBCUTANEOUSLY   benzonatate  (TESSALON ) 200 MG capsule Take 1 capsule (200 mg total) by mouth 3 (three) times daily as needed for cough.   Blood Glucose Monitoring Suppl (ACCU-CHEK AVIVA PLUS) W/DEVICE KIT Used as directed   buprenorphine (BUTRANS) 20 MCG/HR PTWK Place 1 patch onto the skin once a week.   cabergoline  (DOSTINEX ) 0.5 MG tablet Take 0.5 tablets (0.25 mg total) by mouth 2 (two) times a week.   calcium  carbonate (OS-CAL)  600 MG TABS tablet Take 1 tablet by mouth daily.   cholecalciferol  (VITAMIN D ) 1000 UNITS tablet Take 1,000 Units by mouth every morning.   cloNIDine  (CATAPRES ) 0.2 MG tablet Take 1 tablet (0.2 mg total) by mouth 2 (two) times daily.   cyclobenzaprine (FLEXERIL) 10 MG tablet Take 10 mg by mouth 3 (three) times daily as needed.   dexlansoprazole (DEXILANT) 60 MG capsule Take 60 mg by mouth daily.   ENBREL 50 MG/ML injection SMARTSIG:1 Milliliter(s) SUB-Q Once a Week   fluticasone -salmeterol (WIXELA INHUB) 250-50 MCG/ACT AEPB Inhale 1 puff into the lungs in the morning and at bedtime.   folic acid  (FOLVITE ) 400 MCG tablet Take 400 mcg by mouth daily.   furosemide  (LASIX ) 20 MG tablet Take 1 tablet (20 mg total) by mouth every morning.   gabapentin  (NEURONTIN ) 400 MG capsule Take 2 capsules (800 mg total) by mouth 3 (three) times daily.   glucose blood (RELION GLUCOSE TEST STRIPS) test strip 1 each by Other route 4 (four) times daily. ICD code E11.65   hydrOXYzine  (ATARAX /VISTARIL ) 25 MG tablet Take 25 mg by mouth at bedtime as needed.   insulin  aspart (NOVOLOG ) 100 UNIT/ML FlexPen Inject 12 Units into the skin 3 (three) times daily.   Lancet Devices (ACCU-CHEK SOFTCLIX) lancets Use as instructed   levothyroxine (SYNTHROID, LEVOTHROID) 75 MCG tablet Take 112 mcg by mouth daily before breakfast.    linaclotide (LINZESS) 145 MCG CAPS capsule Take 145 mcg by mouth daily before breakfast.   losartan  (COZAAR ) 100 MG tablet Take 1 tablet (100  mg total) by mouth every morning.   methocarbamol  (ROBAXIN ) 750 MG tablet Take 1 tablet (750 mg total) by mouth 3 (three) times daily. MUST MAKE APPT FOR FURTHER REFILLS   methotrexate (RHEUMATREX) 2.5 MG tablet Take 2.5 mg by mouth once a week. 8 pills wkly .Caution:Chemotherapy. Protect from light.    metoprolol  tartrate (LOPRESSOR ) 100 MG tablet TAKE 1 TAB AN HOUR BEFORE CT   Misc. Devices (CANE) MISC 1 each by Does not apply route daily.   montelukast   (SINGULAIR ) 10 MG tablet Take 1 tablet (10 mg total) by mouth at bedtime.   nitroGLYCERIN  (NITROSTAT ) 0.4 MG SL tablet Place 1 tablet (0.4 mg total) under the tongue every 5 (five) minutes as needed.   omeprazole  (PRILOSEC) 40 MG capsule Take 40 mg by mouth daily.   PARoxetine  (PAXIL ) 20 MG tablet Take 1 tablet (20 mg total) by mouth daily. MUST MAKE APPT FOR FURTHER REFILLS   [EXPIRED] predniSONE  (DELTASONE ) 10 MG tablet Take 4 tablets (40 mg total) by mouth daily with breakfast for 3 days, THEN 3 tablets (30 mg total) daily with breakfast for 3 days, THEN 2 tablets (20 mg total) daily with breakfast for 3 days, THEN 1 tablet (10 mg total) daily with breakfast for 3 days.   promethazine  (PHENERGAN ) 25 MG tablet Take 1 tablet (25 mg total) by mouth every 6 (six) hours as needed for nausea or vomiting.   rosuvastatin  (CRESTOR ) 20 MG tablet Take 1 tablet (20 mg total) by mouth daily.   spironolactone  (ALDACTONE ) 25 MG tablet Take 25 mg by mouth daily.   terbinafine  (LAMISIL ) 250 MG tablet Take 1 tablet (250 mg total) by mouth daily.   Tezepelumab-ekko (TEZSPIRE) 210 MG/1. SOAJ Inject 210 mg into the skin every 28 (twenty-eight) days.   TOUJEO  MAX SOLOSTAR 300 UNIT/ML Solostar Pen SMARTSIG:60 Unit(s) SUB-Q Every Night   traMADol  (ULTRAM ) 50 MG tablet Take by mouth every 6 (six) hours as needed.   traZODone (DESYREL) 50 MG tablet Take 50 mg by mouth at bedtime.   umeclidinium bromide  (INCRUSE ELLIPTA ) 62.5 MCG/ACT AEPB Inhale 1 puff into the lungs daily.   verapamil  (CALAN -SR) 180 MG CR tablet Take 2 tablets (360 mg total) by mouth at bedtime.   [DISCONTINUED] dupilumab  (DUPIXENT ) 300 MG/2ML prefilled syringe Inject 300 mg into the skin every 14 (fourteen) days.   [DISCONTINUED] Tezepelumab-ekko (TEZSPIRE) 210 MG/1. SOAJ Inject 210 mg into the skin every 28 (twenty-eight) days. Courier to rheum: [address], [place] [address]. Appt on 07/17/2024 . [facility] clinic    Facility-Administered Encounter Medications as of 07/17/2024  Medication   DOBUTamine  (DOBUTREX ) 1,000 mcg/mL in dextrose  5% 250 mL infusion    Immunization History  Administered Date(s) Administered   Hepatitis B 03/23/2015, 07/06/2015   Hepatitis B, PED/ADOLESCENT 03/23/2015, 07/06/2015   Influenza Split 05/25/2015   Influenza,inj,Quad PF,6+ Mos 05/13/2016, 05/29/2017, 07/13/2018, 06/03/2019, 06/17/2020   Influenza-Unspecified 05/25/2015, 05/13/2016, 05/29/2017, 07/13/2018, 06/03/2019   PFIZER(Purple Top)SARS-COV-2 Vaccination 12/02/2019, 12/23/2019   Pneumococcal Polysaccharide-23 07/31/2017   Pneumococcal-Unspecified 04/12/2012   Tdap 06/24/2015    PFTs    Latest Ref Rng & Units 11/28/2023   10:40 AM 02/01/2018    9:54 AM 11/03/2014    9:38 AM  PFT Results  FVC-Pre L 1.82  2.03  1.84   FVC-Predicted Pre % 72  78  70   FVC-Post L 1.92  1.99  1.83   FVC-Predicted Post % 76  76  70   Pre FEV1/FVC % % 81  81  83   Post FEV1/FCV % % 83  87  85   FEV1-Pre L 1.47  1.65  1.52   FEV1-Predicted Pre % 74  78  71   FEV1-Post L 1.59  1.73  1.56   DLCO uncorrected ml/min/mmHg 20.29  15.96  19.95   DLCO UNC% % 134  131  164   DLCO corrected ml/min/mmHg 20.75     DLCO COR %Predicted % 137     DLVA Predicted % 132  169  201   TLC L 4.00  3.83  3.47   TLC % Predicted % 107  103  93   RV % Predicted % 134  139  107     Eosinophils Most recent blood eosinophil count was 800 cells/microL taken on 06/28/2024.   IgE: 21 on 06/28/2024  Assessment   Biologics training for tezepulumab Phill)  Goals of therapy: Mechanism: human monoclonal IgG2? antibody that binds to TSLP. This blocks TSLP from its effect on inflammation including reduce eosinophils, IgE, FeNO, IL-5, and IL-13. Mechanism is not definitively established. Reviewed that Tezspire is add-on medication and patient must continue maintenance inhaler regimen. Response to therapy: may take 3-4 months to determine  efficacy.  Side effects: injection site reaction (6-18%), antibody development (2%), arthralgia (4%), back pain (4%), pharyngitis (4%)  Dose: Tezspire 210 mg once every 4 weeks  Administration/Storage:  Reviewed administration sites of thigh or abdomen (at least 2-3 inches away from abdomen). Reviewed the upper arm is only appropriate if caregiver is administering injection  Do not shake pen/syringe as this could lead to product foaming or precipitation. Do not shake syringe as this could lead to product foaming or precipitation.  Access: Approval of Tezspire through: insurance  Patient self-administered Tezspire 210mg /1.91 ml in right lower abdomen using pharmacy-supplied medication: Tezspire 210mg /1.91 ml Autoinjector pen NDC: [address]-01 Lot: [id number] Expiration: 07/12/2026  Patient monitored for 30 minutes for adverse reaction.  Patient tolerated well. Injection site noted. Patient denies itchiness and irritation at injection., No swelling or redness noted., and Reviewed injection site reaction management with patient verbally and printed information for review in AVS  Medication Reconciliation  A drug regimen assessment was performed, including review of allergies, interactions, disease-state management, dosing and immunization history. Medications were reviewed with the patient, including name, instructions, indication, goals of therapy, potential side effects, importance of adherence, and safe use.  Drug interaction(s): none noted  PLAN Continue Tezspire 210mg  SQ every 28 days.  Rx sent to: [facility]: [phone].   Continue maintenance asthma regimen of:  Wixela 250/50mcg 1 puff twice daily + Incruse 62.5mcg 1 puff once daily + montelukast  10mg  nightly  All questions encouraged and answered.  Instructed patient to reach out with any further questions or concerns.  Thank you for allowing pharmacy to participate in this patient's care.  This appointment  required 45 minutes of patient care (this includes precharting, chart review, review of results, face-to-face care, etc.).   Sherry Pennant, PharmD, MPH, BCPS, CPP Clinical Pharmacist [facility])

## 2024-07-17 ENCOUNTER — Ambulatory Visit: Attending: Internal Medicine | Admitting: Pharmacist

## 2024-07-17 DIAGNOSIS — Z7189 Other specified counseling: Secondary | ICD-10-CM | POA: Insufficient documentation

## 2024-07-17 DIAGNOSIS — J4550 Severe persistent asthma, uncomplicated: Secondary | ICD-10-CM | POA: Insufficient documentation

## 2024-07-17 MED ORDER — TEZSPIRE 210 MG/1.91ML ~~LOC~~ SOAJ: 210.0000 mg | SUBCUTANEOUS | 4 refills | Status: AC

## 2024-07-17 NOTE — Progress Notes (Signed)
 Patient is a one-time fill for [employer]. She completed Tezspire in office on 07/17/2024. Tolerated well. She self-injects Enbrel, Dupixent , and insulin  and feels comfortable.   Denies any previous injection site reaction with Dupixent .  Please see pharmacotherapy visit on 07/17/2024  She will need to fill with [facility] moving forward.  Sherry Pennant, PharmD, MPH, BCPS, CPP Clinical Pharmacist [facility])

## 2024-07-17 NOTE — Telephone Encounter (Signed)
 Spoke with patient using interpreter [phone]. Advised of [facility] clinic address.  Sherry Pennant, PharmD, MPH, BCPS, CPP Clinical Pharmacist [facility])

## 2024-07-17 NOTE — Patient Instructions (Addendum)
 Su prxima dosis de TEZSPIRE es el 3 de [address], el [address] de [place], y cada 4 semanas despus de eso.  CONTINA estos medicamentos: Wixela: 1 hospital doctor; Incruse: 1 inhalacin una vez al da ; y Montelukast  10 mg: todas las noches  Su receta ser enviada desde [facility]. Su nmero de telfono es [phone]. Por favor, llama para programar el envo y confirmar su direccin. Ellos se enviarn el medicamento a tu domicilio.  Debers tener una cita con tu proveedor en 3 a 4 meses para evaluar cmo est funcionando Tezspire. Por favor, mantn tu cita de enero de 2026 con la [place].  Mantente al da con todas las vacunas de rutina: influenza, neumona, COVID-19 y herpes zster (Shingles).  Cmo manejar una reaccin en el sitio de inyeccin: - COUNTER (Mostrador): deja el medicamento sobre una superficie (como el government social research officer) al menos 30 minutos, o hasta toda la noche, para que alcance la [place]. Esto puede ayudar a printmaker. - COLD (Fro): coloca algo fro (como una compresa de gel o una botella de agua fra) sobre el sitio de inyeccin justo antes de limpiarlo con alcohol. Esto puede reducir chief technology officer. - CLARITIN: usa  Claritin (nombre genrico loratadina) durante las primeras [employer] del tratamiento o el da antes, medical laboratory scientific officer de, y medical laboratory scientific officer despus de la inyeccin. Esto ayudar a armed forces technical officer sitio de inyeccin. - CORTISONE CREAM (Crema de cortisona): aplica si el sitio de la inyeccin est irritado o presenta picazn. - CALL ME ([place]): si la reaccin en el sitio de inyeccin es ms grande que el tamao de tu puo, parece infectada, presenta ampollas o si desarrollas urticaria (ronchas).  [facility] - [phone] Dr. Maya Nash Dr. Lonni Ester Dr. Asberry Claw - nueva doctora

## 2024-08-16 ENCOUNTER — Other Ambulatory Visit: Payer: Self-pay | Admitting: Pulmonary Disease

## 2024-08-16 DIAGNOSIS — J4550 Severe persistent asthma, uncomplicated: Secondary | ICD-10-CM

## 2024-08-19 NOTE — Telephone Encounter (Signed)
 Refill request received for Dupixent . See 07/02/24 telephone thread - plan per Dr. Kassie is to switch Dupixent  to Tezspire . Patient started Tezspire  on 07/17/24.

## 2024-09-09 ENCOUNTER — Emergency Department (HOSPITAL_COMMUNITY)
Admission: EM | Admit: 2024-09-09 | Discharge: 2024-09-09 | Disposition: A | Discharge status: Home / Self Care | Source: Ambulatory Visit | Attending: Emergency Medicine | Admitting: Emergency Medicine

## 2024-09-09 ENCOUNTER — Encounter (HOSPITAL_COMMUNITY): Payer: Self-pay

## 2024-09-09 ENCOUNTER — Ambulatory Visit (HOSPITAL_COMMUNITY)
Admission: EM | Admit: 2024-09-09 | Discharge: 2024-09-09 | Disposition: A | Discharge status: Home / Self Care | Attending: Student | Admitting: Student

## 2024-09-09 ENCOUNTER — Emergency Department (HOSPITAL_COMMUNITY)

## 2024-09-09 ENCOUNTER — Encounter (HOSPITAL_COMMUNITY): Payer: Self-pay | Admitting: Emergency Medicine

## 2024-09-09 ENCOUNTER — Other Ambulatory Visit: Payer: Self-pay

## 2024-09-09 DIAGNOSIS — Z7951 Long term (current) use of inhaled steroids: Secondary | ICD-10-CM | POA: Diagnosis not present

## 2024-09-09 DIAGNOSIS — E1165 Type 2 diabetes mellitus with hyperglycemia: Secondary | ICD-10-CM | POA: Diagnosis not present

## 2024-09-09 DIAGNOSIS — N182 Chronic kidney disease, stage 2 (mild): Secondary | ICD-10-CM | POA: Diagnosis not present

## 2024-09-09 DIAGNOSIS — Z79899 Other long term (current) drug therapy: Secondary | ICD-10-CM | POA: Insufficient documentation

## 2024-09-09 DIAGNOSIS — Z7982 Long term (current) use of aspirin: Secondary | ICD-10-CM | POA: Diagnosis not present

## 2024-09-09 DIAGNOSIS — R112 Nausea with vomiting, unspecified: Secondary | ICD-10-CM | POA: Diagnosis not present

## 2024-09-09 DIAGNOSIS — Z794 Long term (current) use of insulin: Secondary | ICD-10-CM | POA: Diagnosis not present

## 2024-09-09 DIAGNOSIS — I129 Hypertensive chronic kidney disease with stage 1 through stage 4 chronic kidney disease, or unspecified chronic kidney disease: Secondary | ICD-10-CM | POA: Diagnosis not present

## 2024-09-09 DIAGNOSIS — E1122 Type 2 diabetes mellitus with diabetic chronic kidney disease: Secondary | ICD-10-CM | POA: Diagnosis not present

## 2024-09-09 DIAGNOSIS — R1011 Right upper quadrant pain: Secondary | ICD-10-CM | POA: Insufficient documentation

## 2024-09-09 DIAGNOSIS — J449 Chronic obstructive pulmonary disease, unspecified: Secondary | ICD-10-CM | POA: Insufficient documentation

## 2024-09-09 DIAGNOSIS — R1012 Left upper quadrant pain: Secondary | ICD-10-CM | POA: Diagnosis not present

## 2024-09-09 DIAGNOSIS — R1013 Epigastric pain: Secondary | ICD-10-CM | POA: Diagnosis not present

## 2024-09-09 LAB — COMPREHENSIVE METABOLIC PANEL WITH GFR
ALT: 27 U/L (ref 0–44)
AST: 36 U/L (ref 15–41)
Albumin: 4.2 g/dL (ref 3.5–5.0)
Alkaline Phosphatase: 99 U/L (ref 38–126)
Anion gap: 12 (ref 5–15)
BUN: 12 mg/dL (ref 6–20)
CO2: 24 mmol/L (ref 22–32)
Calcium: 9.2 mg/dL (ref 8.9–10.3)
Chloride: 100 mmol/L (ref 98–111)
Creatinine, Ser: 0.8 mg/dL (ref 0.44–1.00)
GFR, Estimated: >60 mL/min
Glucose, Bld: 134 mg/dL — ABNORMAL HIGH (ref 70–99)
Potassium: 3.7 mmol/L (ref 3.5–5.1)
Sodium: 136 mmol/L (ref 135–145)
Total Bilirubin: 0.4 mg/dL (ref 0.0–1.2)
Total Protein: 8.1 g/dL (ref 6.5–8.1)

## 2024-09-09 LAB — CBC WITH DIFFERENTIAL/PLATELET
Abs Immature Granulocytes: 0.04 K/uL (ref 0.00–0.07)
Basophils Absolute: 0 K/uL (ref 0.0–0.1)
Basophils Relative: 0 %
Eosinophils Absolute: 0 K/uL (ref 0.0–0.5)
Eosinophils Relative: 0 %
HCT: 39.4 % (ref 36.0–46.0)
Hemoglobin: 12.5 g/dL (ref 12.0–15.0)
Immature Granulocytes: 1 %
Lymphocytes Relative: 25 %
Lymphs Abs: 1.9 K/uL (ref 0.7–4.0)
MCH: 25.8 pg — ABNORMAL LOW (ref 26.0–34.0)
MCHC: 31.7 g/dL (ref 30.0–36.0)
MCV: 81.4 fL (ref 80.0–100.0)
Monocytes Absolute: 0.6 K/uL (ref 0.1–1.0)
Monocytes Relative: 8 %
Neutro Abs: 5.1 K/uL (ref 1.7–7.7)
Neutrophils Relative %: 66 %
Platelets: 368 K/uL (ref 150–400)
RBC: 4.84 MIL/uL (ref 3.87–5.11)
RDW: 14 % (ref 11.5–15.5)
WBC: 7.7 K/uL (ref 4.0–10.5)
nRBC: 0 % (ref 0.0–0.2)

## 2024-09-09 LAB — LIPASE, BLOOD: Lipase: 23 U/L (ref 11–51)

## 2024-09-09 LAB — URINALYSIS, ROUTINE W REFLEX MICROSCOPIC
Bacteria, UA: NONE SEEN
Bilirubin Urine: NEGATIVE
Glucose, UA: NEGATIVE mg/dL
Hgb urine dipstick: NEGATIVE
Ketones, ur: 20 mg/dL — AB
Leukocytes,Ua: NEGATIVE
Nitrite: NEGATIVE
Protein, ur: >=300 mg/dL — AB
Specific Gravity, Urine: 1.029 (ref 1.005–1.030)
pH: 5 (ref 5.0–8.0)

## 2024-09-09 LAB — GLUCOSE, POCT (MANUAL RESULT ENTRY): POCT Glucose (KUC): 141 mg/dL — AB (ref 70–99)

## 2024-09-09 MED ORDER — ONDANSETRON 4 MG PO TBDP
4.0000 mg | ORAL_TABLET | Freq: Once | ORAL | Status: AC
  Administered 2024-09-09: 4 mg via ORAL
  Filled 2024-09-09: qty 1

## 2024-09-09 MED ORDER — ALUM & MAG HYDROXIDE-SIMETH 200-200-20 MG/5ML PO SUSP
30.0000 mL | Freq: Once | ORAL | Status: DC
  Filled 2024-09-09: qty 30

## 2024-09-09 MED ORDER — ALUM & MAG HYDROXIDE-SIMETH 200-200-20 MG/5ML PO SUSP
ORAL | Status: AC
  Filled 2024-09-09: qty 30

## 2024-09-09 MED ORDER — ALUM & MAG HYDROXIDE-SIMETH 200-200-20 MG/5ML PO SUSP: 30.0000 mL | Freq: Once | ORAL | Status: DC

## 2024-09-09 MED ORDER — LIDOCAINE VISCOUS HCL 2 % MT SOLN
OROMUCOSAL | Status: AC
  Filled 2024-09-09: qty 15

## 2024-09-09 MED ORDER — ONDANSETRON 4 MG PO TBDP
4.0000 mg | ORAL_TABLET | Freq: Once | ORAL | Status: AC
  Administered 2024-09-09: 4 mg via ORAL

## 2024-09-09 MED ORDER — LIDOCAINE VISCOUS HCL 2 % MT SOLN: 15.0000 mL | Freq: Once | OROMUCOSAL | Status: DC

## 2024-09-09 MED ORDER — ONDANSETRON 4 MG PO TBDP: 4.0000 mg | ORAL_TABLET | Freq: Three times a day (TID) | ORAL | 0 refills | Status: AC | PRN

## 2024-09-09 MED ORDER — LIDOCAINE VISCOUS HCL 2 % MT SOLN
15.0000 mL | Freq: Once | OROMUCOSAL | Status: DC
  Filled 2024-09-09: qty 15

## 2024-09-09 MED ORDER — ONDANSETRON 4 MG PO TBDP
ORAL_TABLET | ORAL | Status: AC
  Filled 2024-09-09: qty 1

## 2024-09-09 NOTE — Discharge Instructions (Signed)
-   I am concerned that you could be having an issue with your gallbladder: Cholecystitis, inflammation of the gallbladder, is a painful condition that can lead to dangerous complications, like sepsis. It can require urgent surgery. -You need medications and imaging that I cannot provide in urgent care.  This includes medications like Phenergan , and imaging like a CT scan.

## 2024-09-09 NOTE — ED Provider Notes (Signed)
 " [place] EMERGENCY DEPARTMENT AT [facility] Provider Note  CSN: [id number] Arrival date & time: 09/09/24 1139  Chief Complaint(s) Abdominal Pain and Emesis  HPI Raven Turner is a 53 y.o. female history of obesity, hypertension, diabetes CKD presenting to the emergency department with vomiting.  Patient reports that she is having vomiting and nausea for the past 2 days.  She reports that after having vomiting she had some burning in her upper abdomen.  She denies any chest pain, shortness of breath, fevers, chills, back pain, painful urination, hematemesis, melena, any other new symptoms.  She reports that she is continuing to have regular bowel movements and passing gas.  Reports symptoms are constant.  Denies similar episode.  Spanish interpreter used.  Past Medical History Past Medical History:  Diagnosis Date   Anemia    Asthma    Bilateral hand numbness 06/25/2015   Bone pain 02/27/2015   Diffuse bone pain in legs mostly but also in arms     Breast pain, left 03/27/2015   Callus of heel 12/16/2014   Compression fracture    Compression fracture of L1 lumbar vertebra (HCC) 08/26/2014   Compression fracture of T12 vertebra (HCC) 08/28/2014   COPD (chronic obstructive pulmonary disease) (HCC) 2013   Cushing syndrome 2013    Depression    Diabetes (HCC) 2013    Diabetes mellitus type 2, uncontrolled 08/26/2014   Sees Dr. Octavia.  Last visit 11/27/14.  No diabetic retinopathy     Diabetes mellitus with neuropathy (HCC)    Diastolic dysfunction without heart failure 10/14/2014   Grade 2 diastolic dysfunction 2/2 pulm HTN    Generalized anxiety disorder 03/27/2015   HTN (hypertension) 2005    Hx of cataract surgery 11/03/2014   LUQ abdominal pain 07/17/2015   Medication management 05/12/2015   Morbid obesity (HCC) 09/08/2014   MRSA colonization 12/15/2014   Non-English speaking patient 09/08/2014   OSA (obstructive sleep apnea) 05/20/2015   Osteopenia 11/19/2013   Dx  with osteopenia in lumbar vertebra with partial compression of L1   Pain due to onychomycosis of toenail 03/27/2015   Secondary Cushing's syndrome/ iatrogenic    Skin rash 07/17/2015   Tachycardia 01/13/2015   Upper airway cough syndrome 09/05/2014   Followed in Pulmonary clinic/  [facility]/ [facility]  - Trial off advair      09/05/2014 >>>  - trial off theoph 09/17/2014 and added flutter  - rec reduce dulera  to 100 2 bid 07/13/2015      Vision changes 06/25/2015   Vitamin D  deficiency 08/28/2014   Patient Active Problem List   Diagnosis Date Noted   CAP (community acquired pneumonia) 06/28/2022   Chronic inflammatory arthritis 10/20/2020   Current chronic use of systemic steroids 03/09/2020   Severe asthma without complication 09/02/2019   CKD (chronic kidney disease) stage 2, GFR 60-89 ml/min 05/08/2019   Arthritis of carpometacarpal (CMC) joint of right thumb 03/07/2019   Soft tissue mass 03/07/2019   Bilateral nephrolithiasis 11/07/2018   Hypothyroidism 11/07/2018   Microscopic hematuria 11/07/2018   Morbid obesity with BMI of 45.0-49.9, adult (HCC) 11/07/2018   Menorrhagia with regular cycle 09/11/2018   Dizziness 09/11/2018   Iron deficiency anemia 06/25/2018   Anemia 05/09/2018   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   Elevated prolactin level 01/19/2017   Microalbuminuria due to type 2 diabetes mellitus (HCC) 12/19/2016   Elevated TSH 12/19/2016   Menstrual changes 12/19/2016   Flaking of scalp 07/18/2016   Acral type peeling skin  syndrome 07/18/2016   Non-traumatic compression fracture of vertebral column (HCC) 01/20/2016   Bilateral carpal tunnel syndrome 12/21/2015   Chronic back pain 11/05/2015   NASH (nonalcoholic steatohepatitis) 09/25/2015   Bilateral hand numbness 06/25/2015   OSA (obstructive sleep apnea) 05/20/2015   Medication management 05/12/2015   Generalized anxiety disorder 03/27/2015   Breast pain, left 03/27/2015   Callus of heel 12/16/2014    MRSA colonization 12/15/2014   Hx of cataract surgery 11/03/2014   Diastolic dysfunction without heart failure 10/14/2014   Morbid obesity (HCC) 09/08/2014   Non-English speaking patient 09/08/2014   Severe persistent asthma with acute exacerbation (HCC) 09/07/2014   Upper airway cough syndrome 09/05/2014   Vitamin D  deficiency 08/28/2014   Compression fracture of T12 vertebra (HCC) 08/28/2014   Diabetes mellitus type 2, uncontrolled 08/26/2014   Compression fracture of L1 lumbar vertebra (HCC) 08/26/2014   Type 2 diabetes mellitus with microalbuminuria (HCC) 08/26/2014   Essential (primary) hypertension    Secondary Cushing's syndrome/ iatrogenic    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 Authorizing Provider  ondansetron  (ZOFRAN -ODT) 4 MG disintegrating tablet Take 1 tablet (4 mg total) by mouth every 8 (eight) hours as needed for nausea or vomiting. 09/09/24  Yes Francesca Elsie CROME, MD  albuterol  (PROVENTIL ) (2.5 MG/3ML) 0.083% nebulizer solution Take 3 mLs (2.5 mg total) by nebulization every 6 (six) hours as needed for wheezing or shortness of breath. 12/11/23   Jude Harden GAILS, MD  albuterol  (VENTOLIN  HFA) 108 (90 Base) MCG/ACT inhaler Inhale 2 puffs into the lungs every 6 (six) hours as needed for wheezing or shortness of breath. 10/16/23   Kassie Acquanetta Bradley, MD  aspirin  EC 81 MG tablet Take 1 tablet (81 mg total) by mouth daily. 10/31/17   Vicci Barnie NOVAK, MD  BD INSULIN  SYRINGE U-500 31G X 0.5 ML MISC U UTD TID SUBCUTANEOUSLY 12/01/18   [provider]  Blood Glucose Monitoring Suppl (ACCU-CHEK AVIVA PLUS) W/DEVICE KIT Used as directed 11/05/14   Funches, Josalyn, MD  buprenorphine (BUTRANS) 20 MCG/HR PTWK Place 1 patch onto the skin once a week. 12/12/23   [provider]  cabergoline  (DOSTINEX ) 0.5 MG tablet Take 0.5 tablets (0.25 mg total) by mouth 2 (two) times a  week. 02/05/18   Vicci Barnie NOVAK, MD  calcium  carbonate (OS-CAL) 600 MG TABS tablet Take 1 tablet by mouth daily.    [provider]  cholecalciferol  (VITAMIN D ) 1000 UNITS tablet Take 1,000 Units by mouth every morning.    [provider]  cloNIDine  (CATAPRES ) 0.2 MG tablet Take 1 tablet (0.2 mg total) by mouth 2 (two) times daily. 11/08/21   Ross, Paula V, MD  cyclobenzaprine (FLEXERIL) 10 MG tablet Take 10 mg by mouth 3 (three) times daily as needed. 12/08/23   [provider]  dexlansoprazole (DEXILANT) 60 MG capsule Take 60 mg by mouth daily.    [provider]  ENBREL 50 MG/ML injection SMARTSIG:1 Milliliter(s) SUB-Q Once a Week    [provider]  fluticasone -salmeterol (WIXELA INHUB) 250-50 MCG/ACT AEPB Inhale 1 puff into the lungs in the morning and at bedtime. 06/28/24   Kassie Acquanetta Bradley, MD  folic acid  (FOLVITE ) 400 MCG tablet Take 400 mcg by mouth daily.    [provider]  furosemide  (LASIX ) 20 MG tablet Take 1 tablet (20 mg total) by mouth every morning. 10/31/17   Vicci Barnie NOVAK, MD  gabapentin  (  NEURONTIN ) 400 MG capsule Take 2 capsules (800 mg total) by mouth 3 (three) times daily. 04/20/18   Vicci Barnie NOVAK, MD  glucose blood (RELION GLUCOSE TEST STRIPS) test strip 1 each by Other route 4 (four) times daily. ICD code E11.65 10/31/17   Vicci Barnie NOVAK, MD  hydrOXYzine  (ATARAX /VISTARIL ) 25 MG tablet Take 25 mg by mouth at bedtime as needed.    [provider]  insulin  aspart (NOVOLOG ) 100 UNIT/ML FlexPen Inject 12 Units into the skin 3 (three) times daily. 06/22/21   [provider]  Lancet Devices (ACCU-CHEK [place]) lancets Use as instructed 04/03/15   Funches, Josalyn, MD  levothyroxine (SYNTHROID, LEVOTHROID) 75 MCG tablet Take 112 mcg by mouth daily before breakfast.  11/09/18   [provider]  linaclotide (LINZESS) 145 MCG CAPS capsule Take 145 mcg by mouth daily before breakfast.    [provider]  losartan  (COZAAR ) 100 MG tablet Take 1 tablet (100 mg total) by mouth every morning. 02/02/18   Vicci Barnie NOVAK, MD  methotrexate (RHEUMATREX) 2.5 MG tablet Take 2.5 mg by mouth once a week. 8 pills wkly .Caution:Chemotherapy. Protect from light.     [provider]  metoprolol  tartrate (LOPRESSOR ) 100 MG tablet TAKE 1 TAB AN HOUR BEFORE CT 07/27/21   Okey Vina GAILS, MD  Misc. Devices (CANE) MISC 1 each by Does not apply route daily. 07/18/16   Funches, Josalyn, MD  montelukast  (SINGULAIR ) 10 MG tablet Take 1 tablet (10 mg total) by mouth at bedtime. 10/05/23   Kassie Acquanetta Bradley, MD  nitroGLYCERIN  (NITROSTAT ) 0.4 MG SL tablet Place 1 tablet (0.4 mg total) under the tongue every 5 (five) minutes as needed. 12/25/18 06/28/24  Bhagat, Aleene, PA  omeprazole  (PRILOSEC) 40 MG capsule Take 40 mg by mouth daily. 12/09/23   [provider]  PARoxetine  (PAXIL ) 20 MG tablet Take 1 tablet (20 mg total) by mouth daily. MUST MAKE APPT FOR FURTHER REFILLS 01/09/19   Delbert Clam, MD  promethazine  (PHENERGAN ) 25 MG tablet Take 1 tablet (25 mg total) by mouth every 6 (six) hours as needed for nausea or vomiting. 09/11/21   Cox, Dozier FALCON, MD  rosuvastatin  (CRESTOR ) 20 MG tablet Take 1 tablet (20 mg total) by mouth daily. 05/10/19   Okey Vina GAILS, MD  spironolactone  (ALDACTONE ) 25 MG tablet Take 25 mg by mouth daily.    [provider]  Tezepelumab -ekko (TEZSPIRE ) 210 MG/1. SOAJ Inject 210 mg into the skin every 28 (twenty-eight) days. 07/17/24   Gajera, Devki S, RPH-CPP  TOUJEO  MAX SOLOSTAR 300 UNIT/ML Solostar Pen SMARTSIG:60 Unit(s) SUB-Q Every Night 06/12/21   [provider]  traMADol  (ULTRAM ) 50 MG tablet Take by mouth every 6 (six) hours as needed.    [provider]  traZODone (DESYREL) 50 MG tablet Take 50 mg by mouth at bedtime.    [provider]  umeclidinium bromide  (INCRUSE ELLIPTA ) 62.5 MCG/ACT AEPB Inhale 1 puff into the lungs  daily. 06/28/24   Kassie Acquanetta Bradley, MD  verapamil  (CALAN -SR) 180 MG CR tablet Take 2 tablets (360 mg total) by mouth at bedtime. 02/02/18   Vicci Barnie NOVAK, MD  Past Surgical History Past Surgical History:  Procedure Laterality Date   CATARACT EXTRACTION Bilateral 2014   HAND SURGERY Right 12/18/2015   TUBAL LIGATION  1993   Family History Family History  Problem Relation Age of Onset   Diabetes Mother    High blood pressure Mother    High Cholesterol Mother    Stroke Father    Asthma Father    High blood pressure Father    High Cholesterol Father    Heart disease Father    Sudden death Father    Hypertension Sister    Hypertension Brother     Social History Social History[1] Allergies Hydrocortisone, Shellfish allergy , and Sulfamethoxazole -trimethoprim   Review of Systems Review of Systems  All other systems reviewed and are negative.   Physical Exam Vital Signs  I have reviewed the triage vital signs BP (!) 140/61 (BP Location: Left Arm)   Pulse 72   Temp 98.8 F (37.1 C) (Oral)   Resp 18   LMP 07/20/2018   SpO2 100%  Physical Exam Vitals and nursing note reviewed.  Constitutional:      General: She is not in acute distress.    Appearance: She is well-developed.  HENT:     Head: Normocephalic and atraumatic.     Mouth/Throat:     Mouth: Mucous membranes are moist.  Eyes:     Pupils: Pupils are equal, round, and reactive to light.  Cardiovascular:     Rate and Rhythm: Normal rate and regular rhythm.     Heart sounds: No murmur heard. Pulmonary:     Effort: Pulmonary effort is normal. No respiratory distress.     Breath sounds: Normal breath sounds.  Abdominal:     General: Abdomen is flat.     Palpations: Abdomen is soft.     Tenderness: There is no abdominal tenderness.  Musculoskeletal:        General: No tenderness.      Right lower leg: No edema.     Left lower leg: No edema.  Skin:    General: Skin is warm and dry.  Neurological:     General: No focal deficit present.     Mental Status: She is alert. Mental status is at baseline.  Psychiatric:        Mood and Affect: Mood normal.        Behavior: Behavior normal.     ED Results and Treatments Labs (all labs ordered are listed, but only abnormal results are displayed) Labs Reviewed  COMPREHENSIVE METABOLIC PANEL WITH GFR - Abnormal; Notable for the following components:      Result Value   Glucose, Bld 134 (*)    All other components within normal limits  CBC WITH DIFFERENTIAL/PLATELET - Abnormal; Notable for the following components:   MCH 25.8 (*)    All other components within normal limits  URINALYSIS, ROUTINE W REFLEX MICROSCOPIC - Abnormal; Notable for the following components:   APPearance HAZY (*)    Ketones, ur 20 (*)    Protein, ur >=300 (*)    All other components within normal limits  LIPASE, BLOOD  Radiology US  Abdomen Limited Result Date: 09/09/2024 EXAM: Right Upper Quadrant Abdominal Ultrasound 09/09/2024 02:47:39 PM TECHNIQUE: Real-time ultrasonography of the right upper quadrant of the abdomen was performed. COMPARISON: US  Abdomen Complete 01/03/2016. CLINICAL HISTORY: Upper abdominal pain. FINDINGS: LIVER: Increased hepatic parenchyma. Focal fatty sparing along the gallbladder. No intrahepatic biliary ductal dilatation. No evidence of mass. Hepatopetal flow in the portal vein. BILIARY SYSTEM: No gallbladder wall thickening. No pericholecystic fluid. No cholelithiasis. Common bile duct is within normal limits measuring 10 mm. IMPRESSION: 1. Hepatic steatosis with focal fatty sparing along the gallbladder fossa. 2. Borderline dilated, 10 mm common bile duct. Electronically signed by: Morgane Naveau MD  09/09/2024 04:53 PM EST RP Workstation: [id number]    Pertinent labs & imaging results that were available during my care of the patient were reviewed by me and considered in my medical decision making (see MDM for details).  Medications Ordered in ED Medications  alum & mag hydroxide-simeth (MAALOX/MYLANTA) 200-200-20 MG/5ML suspension 30 mL (30 mLs Oral Patient Refused/Not Given 09/09/24 1921)    And  lidocaine  (XYLOCAINE ) 2 % viscous mouth solution 15 mL (15 mLs Oral Patient Refused/Not Given 09/09/24 1921)  ondansetron  (ZOFRAN -ODT) disintegrating tablet 4 mg (4 mg Oral Given 09/09/24 1334)  ondansetron  (ZOFRAN -ODT) disintegrating tablet 4 mg (4 mg Oral Given 09/09/24 1924)                                                                                                                                     Procedures Procedures  (including critical care time)  Medical Decision Making / ED Course   MDM:  53 year old presenting to the emergency department with nausea and vomiting.  Patient is overall very well-appearing, appears well-hydrated, physical examination is reassuring.  No abdominal tenderness.  Unclear cause of symptoms, consider dangerous process such as pancreatitis but no abdominal tenderness, lipase is normal, consider cholecystitis, biliary colic but ultrasound without evidence of this.  Could represent gastritis, offered GI cocktail however patient did not want to try this because she said it did not taste well.  Doubt other process such as obstruction, patient reports she is to continue to have normal bowel movements.  She has tolerated p.o. without vomiting in the emergency department.  Considered obtaining other testing such as a CT scan but without any focal tenderness or concerning symptoms for obstruction I do not think that this is necessary.  Recommended follow-up with her primary physician.  Will prescribe Zofran .  Will discharge patient to home. All questions  answered. Patient comfortable with plan of discharge. Return precautions discussed with patient and specified on the after visit summary.      Lab Tests: -I ordered, reviewed, and interpreted labs.   The pertinent results include:   Labs Reviewed  COMPREHENSIVE METABOLIC PANEL WITH GFR - Abnormal; Notable for the following components:      Result Value   Glucose, Bld 134 (*)  All other components within normal limits  CBC WITH DIFFERENTIAL/PLATELET - Abnormal; Notable for the following components:   MCH 25.8 (*)    All other components within normal limits  URINALYSIS, ROUTINE W REFLEX MICROSCOPIC - Abnormal; Notable for the following components:   APPearance HAZY (*)    Ketones, ur 20 (*)    Protein, ur >=300 (*)    All other components within normal limits  LIPASE, BLOOD    Notable for mild hyperglycemia   EKG   EKG Interpretation Date/Time:  Monday September 09 2024 19:46:45 EST Ventricular Rate:  69 PR Interval:  156 QRS Duration:  155 QT Interval:  445 QTC Calculation: 477 R Axis:   53  Text Interpretation: Sinus rhythm Right bundle branch block Confirmed by Francesca Fallow ([id number]) on 09/09/2024 7:47:54 PM         Imaging Studies ordered: I ordered imaging studies including US  RUQ  On my interpretation imaging demonstrates no acute process I independently visualized and interpreted imaging. I agree with the radiologist interpretation   Medicines ordered and prescription drug management: Meds ordered this encounter  Medications   ondansetron  (ZOFRAN -ODT) disintegrating tablet 4 mg   AND Linked Order Group    alum & mag hydroxide-simeth (MAALOX/MYLANTA) 200-200-20 MG/5ML suspension 30 mL    lidocaine  (XYLOCAINE ) 2 % viscous mouth solution 15 mL   ondansetron  (ZOFRAN -ODT) disintegrating tablet 4 mg   ondansetron  (ZOFRAN -ODT) 4 MG disintegrating tablet    Sig: Take 1 tablet (4 mg total) by mouth every 8 (eight) hours as needed for nausea or vomiting.     Dispense:  20 tablet    Refill:  0    -I have reviewed the patients home medicines and have made adjustments as needed   Social Determinants of Health:  Diagnosis or treatment significantly limited by social determinants of health: obesity    Co morbidities that complicate the patient evaluation  Past Medical History:  Diagnosis Date   Anemia    Asthma    Bilateral hand numbness 06/25/2015   Bone pain 02/27/2015   Diffuse bone pain in legs mostly but also in arms     Breast pain, left 03/27/2015   Callus of heel 12/16/2014   Compression fracture    Compression fracture of L1 lumbar vertebra (HCC) 08/26/2014   Compression fracture of T12 vertebra (HCC) 08/28/2014   COPD (chronic obstructive pulmonary disease) (HCC) 2013   Cushing syndrome 2013    Depression    Diabetes (HCC) 2013    Diabetes mellitus type 2, uncontrolled 08/26/2014   Sees Dr. Octavia.  Last visit 11/27/14.  No diabetic retinopathy     Diabetes mellitus with neuropathy (HCC)    Diastolic dysfunction without heart failure 10/14/2014   Grade 2 diastolic dysfunction 2/2 pulm HTN    Generalized anxiety disorder 03/27/2015   HTN (hypertension) 2005    Hx of cataract surgery 11/03/2014   LUQ abdominal pain 07/17/2015   Medication management 05/12/2015   Morbid obesity (HCC) 09/08/2014   MRSA colonization 12/15/2014   Non-English speaking patient 09/08/2014   OSA (obstructive sleep apnea) 05/20/2015   Osteopenia 11/19/2013   Dx with osteopenia in lumbar vertebra with partial compression of L1   Pain due to onychomycosis of toenail 03/27/2015   Secondary Cushing's syndrome/ iatrogenic    Skin rash 07/17/2015   Tachycardia 01/13/2015   Upper airway cough syndrome 09/05/2014   Followed in Pulmonary clinic/ [facility]/ [facility]  - Trial off advair      09/05/2014 >>>  -  trial off theoph 09/17/2014 and added flutter  - rec reduce dulera  to 100 2 bid 07/13/2015      Vision changes 06/25/2015   Vitamin D  deficiency 08/28/2014       Dispostion: Disposition decision including need for hospitalization was considered, and patient discharged from emergency department.    Final Clinical Impression(s) / ED Diagnoses Final diagnoses:  Nausea and vomiting, unspecified vomiting type     This chart was dictated using voice recognition software.  Despite best efforts to proofread,  errors can occur which can change the documentation meaning.     [1]  Social History Tobacco Use   Smoking status: Never   Smokeless tobacco: Never  Vaping Use   Vaping status: Never Used  Substance Use Topics   Alcohol use: No    Alcohol/week: 0.0 standard drinks of alcohol   Drug use: No     Francesca Elsie CROME, MD 09/09/24 1948  "

## 2024-09-09 NOTE — ED Triage Notes (Signed)
 Patient here with complaints of abdominal pain and vomiting. She states that the pain and vomiting started yesterday morning. Denies diarrhea, fever and any other symptoms. Pain is in the RUQ and LUQ.

## 2024-09-09 NOTE — ED Provider Notes (Signed)
 " [facility]    CSN: [id number] Arrival date & time: 09/09/24  9146      History   Chief Complaint Chief Complaint  Patient presents with   Emesis    HPI Neyra Pettie is a 53 y.o. female presenting with nausea and vomiting.  Pt reports body aches and vomiting that started yesterday morning. States one episode of bilious episode today (last 12 hours), describes as bilious. Describes the abd pain as epigastric, burning, and cramping. Denies diarrhea; last BM was 1 day ago and was normal. Difficulty tolerating fluids. Hasn't taken any medication for symptoms. Denies cough, congestion. Denies BRBPR, hematemesis, melena, hematochezia. Denies recent travel. Denies recent abx. Denies urinary sx.  H/o tubal ligation, denies prior h/o other abd procedures.   Translation provided by family member at patient preference.     HPI  Past Medical History:  Diagnosis Date   Anemia    Asthma    Bilateral hand numbness 06/25/2015   Bone pain 02/27/2015   Diffuse bone pain in legs mostly but also in arms     Breast pain, left 03/27/2015   Callus of heel 12/16/2014   Compression fracture    Compression fracture of L1 lumbar vertebra (HCC) 08/26/2014   Compression fracture of T12 vertebra (HCC) 08/28/2014   COPD (chronic obstructive pulmonary disease) (HCC) 2013   Cushing syndrome 2013    Depression    Diabetes (HCC) 2013    Diabetes mellitus type 2, uncontrolled 08/26/2014   Sees Dr. Octavia.  Last visit 11/27/14.  No diabetic retinopathy     Diabetes mellitus with neuropathy (HCC)    Diastolic dysfunction without heart failure 10/14/2014   Grade 2 diastolic dysfunction 2/2 pulm HTN    Generalized anxiety disorder 03/27/2015   HTN (hypertension) 2005    Hx of cataract surgery 11/03/2014   LUQ abdominal pain 07/17/2015   Medication management 05/12/2015   Morbid obesity (HCC) 09/08/2014   MRSA colonization 12/15/2014   Non-English speaking patient 09/08/2014   OSA (obstructive  sleep apnea) 05/20/2015   Osteopenia 11/19/2013   Dx with osteopenia in lumbar vertebra with partial compression of L1   Pain due to onychomycosis of toenail 03/27/2015   Secondary Cushing's syndrome/ iatrogenic    Skin rash 07/17/2015   Tachycardia 01/13/2015   Upper airway cough syndrome 09/05/2014   Followed in Pulmonary clinic/ [facility]/ [place]  - Trial off advair      09/05/2014 >>>  - trial off theoph 09/17/2014 and added flutter  - rec reduce dulera  to 100 2 bid 07/13/2015      Vision changes 06/25/2015   Vitamin D  deficiency 08/28/2014    Patient Active Problem List   Diagnosis Date Noted   CAP (community acquired pneumonia) 06/28/2022   Chronic inflammatory arthritis 10/20/2020   Current chronic use of systemic steroids 03/09/2020   Severe asthma without complication 09/02/2019   CKD (chronic kidney disease) stage 2, GFR 60-89 ml/min 05/08/2019   Arthritis of carpometacarpal (CMC) joint of right thumb 03/07/2019   Soft tissue mass 03/07/2019   Bilateral nephrolithiasis 11/07/2018   Hypothyroidism 11/07/2018   Microscopic hematuria 11/07/2018   Morbid obesity with BMI of 45.0-49.9, adult (HCC) 11/07/2018   Menorrhagia with regular cycle 09/11/2018   Dizziness 09/11/2018   Iron deficiency anemia 06/25/2018   Anemia 05/09/2018   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   Elevated prolactin level 01/19/2017   Microalbuminuria due to type 2 diabetes mellitus (HCC) 12/19/2016   Elevated TSH 12/19/2016  Menstrual changes 12/19/2016   Flaking of scalp 07/18/2016   Acral type peeling skin syndrome 07/18/2016   Non-traumatic compression fracture of vertebral column (HCC) 01/20/2016   Bilateral carpal tunnel syndrome 12/21/2015   Chronic back pain 11/05/2015   NASH (nonalcoholic steatohepatitis) 09/25/2015   Bilateral hand numbness 06/25/2015   OSA (obstructive sleep apnea) 05/20/2015   Medication management 05/12/2015   Generalized anxiety disorder 03/27/2015   Breast  pain, left 03/27/2015   Callus of heel 12/16/2014   MRSA colonization 12/15/2014   Hx of cataract surgery 11/03/2014   Diastolic dysfunction without heart failure 10/14/2014   Morbid obesity (HCC) 09/08/2014   Non-English speaking patient 09/08/2014   Severe persistent asthma with acute exacerbation (HCC) 09/07/2014   Upper airway cough syndrome 09/05/2014   Vitamin D  deficiency 08/28/2014   Compression fracture of T12 vertebra (HCC) 08/28/2014   Diabetes mellitus type 2, uncontrolled 08/26/2014   Compression fracture of L1 lumbar vertebra (HCC) 08/26/2014   Type 2 diabetes mellitus with microalbuminuria (HCC) 08/26/2014   Essential (primary) hypertension    Secondary Cushing's syndrome/ iatrogenic    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 Authorizing Provider  albuterol  (PROVENTIL ) (2.5 MG/3ML) 0.083% nebulizer solution Take 3 mLs (2.5 mg total) by nebulization every 6 (six) hours as needed for wheezing or shortness of breath. 12/11/23   Jude Harden GAILS, MD  albuterol  (VENTOLIN  HFA) 108 (90 Base) MCG/ACT inhaler Inhale 2 puffs into the lungs every 6 (six) hours as needed for wheezing or shortness of breath. 10/16/23   Kassie Acquanetta Bradley, MD  aspirin  EC 81 MG tablet Take 1 tablet (81 mg total) by mouth daily. 10/31/17   Vicci Barnie NOVAK, MD  BD INSULIN  SYRINGE U-500 31G X 0.5 ML MISC U UTD TID SUBCUTANEOUSLY 12/01/18   [provider]  Blood Glucose Monitoring Suppl (ACCU-CHEK AVIVA PLUS) W/DEVICE KIT Used as directed 11/05/14   Funches, Josalyn, MD   buprenorphine (BUTRANS) 20 MCG/HR PTWK Place 1 patch onto the skin once a week. 12/12/23   [provider]  cabergoline  (DOSTINEX ) 0.5 MG tablet Take 0.5 tablets (0.25 mg total) by mouth 2 (two) times a week. 02/05/18   Vicci Barnie NOVAK, MD  calcium  carbonate (OS-CAL) 600 MG TABS tablet Take 1 tablet by mouth daily.    [provider]  cholecalciferol  (VITAMIN D ) 1000 UNITS tablet Take 1,000 Units by mouth every morning.    [provider]  cloNIDine  (CATAPRES ) 0.2 MG tablet Take 1 tablet (0.2 mg total) by mouth 2 (two) times daily. 11/08/21   Ross, Paula V, MD  cyclobenzaprine (FLEXERIL) 10 MG tablet Take 10 mg by mouth 3 (three) times daily as needed. 12/08/23   [provider]  dexlansoprazole (DEXILANT) 60 MG capsule Take 60 mg  by mouth daily.    [provider]  ENBREL 50 MG/ML injection SMARTSIG:1 Milliliter(s) SUB-Q Once a Week    [provider]  fluticasone -salmeterol (WIXELA INHUB) 250-50 MCG/ACT AEPB Inhale 1 puff into the lungs in the morning and at bedtime. 06/28/24   Kassie Acquanetta Bradley, MD  folic acid  (FOLVITE ) 400 MCG tablet Take 400 mcg by mouth daily.    [provider]  furosemide  (LASIX ) 20 MG tablet Take 1 tablet (20 mg total) by mouth every morning. 10/31/17   Vicci Barnie NOVAK, MD  gabapentin  (NEURONTIN ) 400 MG capsule Take 2 capsules (800 mg total) by mouth 3 (three) times daily. 04/20/18   Vicci Barnie NOVAK, MD  glucose blood (RELION GLUCOSE TEST STRIPS) test strip 1 each by Other route 4 (four) times daily. ICD code E11.65 10/31/17   Vicci Barnie NOVAK, MD  hydrOXYzine  (ATARAX /VISTARIL ) 25 MG tablet Take 25 mg by mouth at bedtime as needed.    [provider]  insulin  aspart (NOVOLOG ) 100 UNIT/ML FlexPen Inject 12 Units into the skin 3 (three) times daily. 06/22/21   [provider]  Lancet Devices (ACCU-CHEK [place]) lancets Use as instructed 04/03/15   Funches, Josalyn, MD  levothyroxine  (SYNTHROID, LEVOTHROID) 75 MCG tablet Take 112 mcg by mouth daily before breakfast.  11/09/18   [provider]  linaclotide (LINZESS) 145 MCG CAPS capsule Take 145 mcg by mouth daily before breakfast.    [provider]  losartan  (COZAAR ) 100 MG tablet Take 1 tablet (100 mg total) by mouth every morning. 02/02/18   Vicci Barnie NOVAK, MD  methotrexate (RHEUMATREX) 2.5 MG tablet Take 2.5 mg by mouth once a week. 8 pills wkly .Caution:Chemotherapy. Protect from light.     [provider]  metoprolol  tartrate (LOPRESSOR ) 100 MG tablet TAKE 1 TAB AN HOUR BEFORE CT 07/27/21   Okey Vina GAILS, MD  Misc. Devices (CANE) MISC 1 each by Does not apply route daily. 07/18/16   Funches, Josalyn, MD  montelukast  (SINGULAIR ) 10 MG tablet Take 1 tablet (10 mg total) by mouth at bedtime. 10/05/23   Kassie Acquanetta Bradley, MD  nitroGLYCERIN  (NITROSTAT ) 0.4 MG SL tablet Place 1 tablet (0.4 mg total) under the tongue every 5 (five) minutes as needed. 12/25/18 06/28/24  Bhagat, Aleene, PA  omeprazole  (PRILOSEC) 40 MG capsule Take 40 mg by mouth daily. 12/09/23   [provider]  PARoxetine  (PAXIL ) 20 MG tablet Take 1 tablet (20 mg total) by mouth daily. MUST MAKE APPT FOR FURTHER REFILLS 01/09/19   Delbert Clam, MD  promethazine  (PHENERGAN ) 25 MG tablet Take 1 tablet (25 mg total) by mouth every 6 (six) hours as needed for nausea or vomiting. 09/11/21   Cox, Dozier FALCON, MD  rosuvastatin  (CRESTOR ) 20 MG tablet Take 1 tablet (20 mg total) by mouth daily. 05/10/19   Okey Vina GAILS, MD  spironolactone  (ALDACTONE ) 25 MG tablet Take 25 mg by mouth daily.    [provider]  Tezepelumab -ekko (TEZSPIRE ) 210 MG/1. SOAJ Inject 210 mg into the skin every 28 (twenty-eight) days. 07/17/24   Gajera, Devki S, RPH-CPP  TOUJEO  MAX SOLOSTAR 300 UNIT/ML Solostar Pen SMARTSIG:60 Unit(s) SUB-Q Every Night 06/12/21   [provider]  traMADol  (ULTRAM ) 50 MG tablet Take by mouth every 6 (six)  hours as needed.    [provider]  traZODone (DESYREL) 50 MG tablet Take 50 mg by mouth at bedtime.    [provider]  umeclidinium bromide  (INCRUSE ELLIPTA ) 62.5 MCG/ACT AEPB Inhale  1 puff into the lungs daily. 06/28/24   Kassie Acquanetta Bradley, MD  verapamil  (CALAN -SR) 180 MG CR tablet Take 2 tablets (360 mg total) by mouth at bedtime. 02/02/18   Vicci Barnie NOVAK, MD    Family History Family History  Problem Relation Age of Onset   Diabetes Mother    High blood pressure Mother    High Cholesterol Mother    Stroke Father    Asthma Father    High blood pressure Father    High Cholesterol Father    Heart disease Father    Sudden death Father    Hypertension Sister    Hypertension Brother     Social History Social History[1]   Allergies   Hydrocortisone, Shellfish allergy , and Sulfamethoxazole -trimethoprim    Review of Systems Review of Systems  Constitutional:  Negative for appetite change, chills and fever.  HENT:  Negative for congestion, ear pain, rhinorrhea, sinus pressure, sinus pain and sore throat.   Eyes:  Negative for redness and visual disturbance.  Respiratory:  Negative for cough, chest tightness, shortness of breath and wheezing.   Cardiovascular:  Negative for chest pain and palpitations.  Gastrointestinal:  Positive for nausea and vomiting. Negative for abdominal pain, constipation and diarrhea.  Genitourinary:  Negative for dysuria, frequency and urgency.  Musculoskeletal:  Positive for myalgias.  Neurological:  Negative for dizziness, weakness and headaches.  Psychiatric/Behavioral:  Negative for confusion.   All other systems reviewed and are negative.    Physical Exam Triage Vital Signs ED Triage Vitals  Encounter Vitals Group     BP 09/09/24 0952 (!) 163/84     Girls Systolic BP Percentile --      Girls Diastolic BP Percentile --      Boys Systolic BP Percentile --      Boys Diastolic BP Percentile --      Pulse Rate 09/09/24  0952 92     Resp 09/09/24 0952 19     Temp 09/09/24 0952 98.1 F (36.7 C)     Temp Source 09/09/24 0952 Oral     SpO2 09/09/24 0952 97 %     Weight --      Height --      Head Circumference --      Peak Flow --      Pain Score 09/09/24 0951 7     Pain Loc --      Pain Education --      Exclude from Growth Chart --    No data found.  Updated Vital Signs BP (!) 163/84 (BP Location: Right Arm)   Pulse 92   Temp 98.1 F (36.7 C) (Oral)   Resp 19   LMP 07/20/2018   SpO2 97%   Visual Acuity Right Eye Distance:   Left Eye Distance:   Bilateral Distance:    Right Eye Near:   Left Eye Near:    Bilateral Near:     Physical Exam Vitals reviewed.  Constitutional:      General: She is not in acute distress.    Appearance: Normal appearance. She is not ill-appearing.  HENT:     Head: Normocephalic and atraumatic.     Mouth/Throat:     Mouth: Mucous membranes are moist.     Comments: Moist mucous membranes Eyes:     Extraocular Movements: Extraocular movements intact.     Pupils: Pupils are equal, round, and reactive to light.  Cardiovascular:     Rate and Rhythm: Normal rate and regular rhythm.  Heart sounds: Normal heart sounds.  Pulmonary:     Effort: Pulmonary effort is normal.     Breath sounds: Normal breath sounds. No wheezing, rhonchi or rales.  Abdominal:     General: Bowel sounds are normal. There is no distension.     Palpations: Abdomen is soft. There is no mass.     Tenderness: There is abdominal tenderness in the right upper quadrant and epigastric area. There is no right CVA tenderness, left CVA tenderness, guarding or rebound.     Comments: There is tenderness to palpation in the epigastric and right upper quadrants, with no guarding or rebound.  Skin:    General: Skin is warm.     Capillary Refill: Capillary refill takes 2 to 3 seconds.     Comments: Good skin turgor  Neurological:     General: No focal deficit present.     Mental Status: She is  alert and oriented to person, place, and time.  Psychiatric:        Mood and Affect: Mood normal.        Behavior: Behavior normal.      UC Treatments / Results  Labs (all labs ordered are listed, but only abnormal results are displayed) Labs Reviewed  GLUCOSE, POCT (MANUAL RESULT ENTRY) - Abnormal; Notable for the following components:      Result Value   POCT Glucose ([facility]) 141 (*)    All other components within normal limits    EKG   Radiology No results found.  Procedures Procedures (including critical care time)  Medications Ordered in UC Medications  ondansetron  (ZOFRAN -ODT) disintegrating tablet 4 mg (4 mg Oral Given 09/09/24 1025)    Initial Impression / Assessment and Plan / UC Course  I have reviewed the triage vital signs and the nursing notes.  Pertinent labs & imaging results that were available during my care of the patient were reviewed by me and considered in my medical decision making (see chart for details).     Patient is a pleasant 53 y.o. female presenting with epigastric/RUQ pain, nausea with vomiting. The patient is afebrile and nontachycardic.  Antipyretic has not been administered today.  Status post hysterectomy.  We administered a tablet of Zofran  ODT, which the patient spat out.  We also offered her a GI cocktail, which she declined. We discussed my concern for acute cholecystitis.  Because she has declined the medications that I have available in urgent care, and I am unable to ameliorate her pain, I recommended that she proceed to the emergency  department for a higher level of care.  This includes possible IV fluids, IM Phenergan , CT imaging, etc.; which are things unavailable to me at this urgent care.  The patient and her family member verbalized understanding.  POC CBG 141  Final Clinical Impressions(s) / UC Diagnoses   Final diagnoses:  Nausea and vomiting, unspecified vomiting type  Epigastric pain     Discharge Instructions       - I am concerned that you could be having an issue with your gallbladder: Cholecystitis, inflammation of the gallbladder, is a painful condition that can lead to dangerous complications, like sepsis. It can require urgent surgery. -You need medications and imaging that I cannot provide in urgent care.  This includes medications like Phenergan , and imaging like a CT scan.     ED Prescriptions   None    PDMP not reviewed this encounter.     [1]  Social History Tobacco Use  Smoking status: Never   Smokeless tobacco: Never  Vaping Use   Vaping status: Never Used  Substance Use Topics   Alcohol use: No    Alcohol/week: 0.0 standard drinks of alcohol   Drug use: No     Arlyss Leita BRAVO, PA-C 09/09/24 1050  "

## 2024-09-09 NOTE — ED Notes (Signed)
 Patient is being discharged from the Urgent Care and sent to the Emergency Department via POV . Per Leita Molly, PA , patient is in need of higher level of care due to possible cholecystitis. Patient is aware and verbalizes understanding of plan of care.  Vitals:   09/09/24 0952  BP: (!) 163/84  Pulse: 92  Resp: 19  Temp: 98.1 F (36.7 C)  SpO2: 97%

## 2024-09-09 NOTE — ED Provider Triage Note (Signed)
 Emergency Medicine Provider Triage Evaluation Note  Raven Turner , a 53 y.o. female  was evaluated in triage.  Pt complains of abd pain. Upper abd pain with nausea and vomit throughout the day today.  No fever, chills, cp, sob, back pain, dysuria.  Seen at [facility] today and sent here.    Review of Systems  Positive: As above Negative: As above  Physical Exam  BP (!) 170/79 (BP Location: Right Wrist)   Pulse 80   Temp 98.2 F (36.8 C)   Resp 17   LMP 07/20/2018   SpO2 96%  Gen:   Awake, no distress   Resp:  Normal effort  MSK:   Moves extremities without difficulty  Other:    Medical Decision Making  Medically screening exam initiated at 1:24 PM.  Appropriate orders placed.  Raven Turner was informed that the remainder of the evaluation will be completed by another provider, this initial triage assessment does not replace that evaluation, and the importance of remaining in the ED until their evaluation is complete.     Raven Colon, PA-C 09/09/24 1325

## 2024-09-09 NOTE — ED Triage Notes (Signed)
 Pt reports body aches and vomiting that started yesterday morning. Hasn't taken any medication for symptoms.

## 2024-09-09 NOTE — Discharge Instructions (Addendum)
 Le evaluamos las nuseas y los vmitos. Las pruebas en urgencias fueron tranquilizadoras. Desconocemos la causa exacta de sus sntomas, pero creemos que puede irse a casa sin problema. Le hemos recetado un medicamento para las nuseas para que lo tome segn sea necesario. Por favor, consulte con su mdico de cabecera. Si presenta sntomas recurrentes o que empeoran, regrese a urgencias.  We evaluated you for your nausea and vomiting.  Your testing in the emergency department was reassuring.  We do not know the exact cause of your symptoms but we feel that it is safe to go home.  We have prescribed you a nausea medication to take as needed for nausea.  Please follow-up closely with your primary doctor.  If you have any recurrent or worsening symptoms please return to the emergency department.

## 2024-10-04 ENCOUNTER — Ambulatory Visit (HOSPITAL_BASED_OUTPATIENT_CLINIC_OR_DEPARTMENT_OTHER): Admitting: Pulmonary Disease
